# Patient Record
Sex: Male | Born: 1960 | Race: Black or African American | Hispanic: No | Marital: Single | Smoking: Current every day smoker
Health system: Southern US, Community
[De-identification: ages and names within clinical notes are randomized; demographics above are authoritative.]

## PROBLEM LIST (undated history)

## (undated) DIAGNOSIS — Z720 Tobacco use: Secondary | ICD-10-CM

## (undated) DIAGNOSIS — I639 Cerebral infarction, unspecified: Secondary | ICD-10-CM

## (undated) DIAGNOSIS — E785 Hyperlipidemia, unspecified: Secondary | ICD-10-CM

## (undated) DIAGNOSIS — I10 Essential (primary) hypertension: Secondary | ICD-10-CM

## (undated) DIAGNOSIS — Z9225 Personal history of immunosupression therapy: Secondary | ICD-10-CM

## (undated) DIAGNOSIS — I739 Peripheral vascular disease, unspecified: Secondary | ICD-10-CM

## (undated) DIAGNOSIS — G8929 Other chronic pain: Secondary | ICD-10-CM

## (undated) DIAGNOSIS — N289 Disorder of kidney and ureter, unspecified: Secondary | ICD-10-CM

## (undated) DIAGNOSIS — M545 Low back pain, unspecified: Secondary | ICD-10-CM

## (undated) DIAGNOSIS — M1990 Unspecified osteoarthritis, unspecified site: Secondary | ICD-10-CM

## (undated) DIAGNOSIS — F419 Anxiety disorder, unspecified: Secondary | ICD-10-CM

## (undated) DIAGNOSIS — I2510 Atherosclerotic heart disease of native coronary artery without angina pectoris: Secondary | ICD-10-CM

## (undated) DIAGNOSIS — I69398 Other sequelae of cerebral infarction: Secondary | ICD-10-CM

## (undated) DIAGNOSIS — Z8739 Personal history of other diseases of the musculoskeletal system and connective tissue: Secondary | ICD-10-CM

## (undated) HISTORY — PX: CARDIAC CATHETERIZATION: SHX172

## (undated) HISTORY — DX: Essential (primary) hypertension: I10

## (undated) HISTORY — DX: Hyperlipidemia, unspecified: E78.5

## (undated) HISTORY — PX: OTHER SURGICAL HISTORY: SHX169

## (undated) HISTORY — DX: Disorder of kidney and ureter, unspecified: N28.9

## (undated) HISTORY — PX: INGUINAL HERNIA REPAIR: SUR1180

## (undated) HISTORY — PX: AV FISTULA PLACEMENT: SHX1204

## (undated) HISTORY — PX: THROMBECTOMY AND REVISION OF ARTERIOVENTOUS (AV) GORETEX  GRAFT: SHX6120

## (undated) HISTORY — DX: Peripheral vascular disease, unspecified: I73.9

## (undated) HISTORY — PX: THROMBECTOMY / ARTERIOVENOUS GRAFT REVISION: SUR1351

---

## 1998-11-17 ENCOUNTER — Emergency Department (HOSPITAL_COMMUNITY): Admission: EM | Admit: 1998-11-17 | Discharge: 1998-11-17 | Payer: Self-pay | Admitting: Emergency Medicine

## 2005-12-20 ENCOUNTER — Ambulatory Visit (HOSPITAL_COMMUNITY): Admission: RE | Admit: 2005-12-20 | Discharge: 2005-12-20 | Payer: Self-pay | Admitting: Vascular Surgery

## 2006-12-31 HISTORY — PX: THROMBECTOMY / ARTERIOVENOUS GRAFT REVISION: SUR1351

## 2007-01-05 ENCOUNTER — Observation Stay (HOSPITAL_COMMUNITY): Admission: EM | Admit: 2007-01-05 | Discharge: 2007-01-05 | Payer: Self-pay | Admitting: Emergency Medicine

## 2007-01-05 ENCOUNTER — Ambulatory Visit: Payer: Self-pay | Admitting: Vascular Surgery

## 2007-01-24 ENCOUNTER — Ambulatory Visit (HOSPITAL_COMMUNITY): Admission: RE | Admit: 2007-01-24 | Discharge: 2007-01-24 | Payer: Self-pay | Admitting: Vascular Surgery

## 2007-03-14 ENCOUNTER — Ambulatory Visit: Payer: Self-pay | Admitting: Vascular Surgery

## 2007-03-15 ENCOUNTER — Ambulatory Visit (HOSPITAL_COMMUNITY): Admission: RE | Admit: 2007-03-15 | Discharge: 2007-03-15 | Payer: Self-pay | Admitting: Vascular Surgery

## 2007-03-21 ENCOUNTER — Ambulatory Visit (HOSPITAL_COMMUNITY): Admission: RE | Admit: 2007-03-21 | Discharge: 2007-03-21 | Payer: Self-pay | Admitting: Vascular Surgery

## 2007-03-24 ENCOUNTER — Ambulatory Visit (HOSPITAL_COMMUNITY): Admission: RE | Admit: 2007-03-24 | Discharge: 2007-03-24 | Payer: Self-pay | Admitting: Vascular Surgery

## 2007-03-31 ENCOUNTER — Ambulatory Visit (HOSPITAL_COMMUNITY): Admission: RE | Admit: 2007-03-31 | Discharge: 2007-03-31 | Payer: Self-pay | Admitting: Nephrology

## 2007-04-01 HISTORY — PX: ARTERIOVENOUS GRAFT PLACEMENT: SUR1029

## 2007-04-09 ENCOUNTER — Ambulatory Visit: Payer: Self-pay | Admitting: Vascular Surgery

## 2007-04-25 ENCOUNTER — Ambulatory Visit (HOSPITAL_COMMUNITY): Admission: RE | Admit: 2007-04-25 | Discharge: 2007-04-25 | Payer: Self-pay | Admitting: Vascular Surgery

## 2007-04-25 ENCOUNTER — Ambulatory Visit: Payer: Self-pay | Admitting: Vascular Surgery

## 2007-07-23 ENCOUNTER — Ambulatory Visit (HOSPITAL_COMMUNITY): Admission: RE | Admit: 2007-07-23 | Discharge: 2007-07-23 | Payer: Self-pay | Admitting: Vascular Surgery

## 2007-07-23 ENCOUNTER — Ambulatory Visit: Payer: Self-pay | Admitting: Vascular Surgery

## 2007-08-13 ENCOUNTER — Ambulatory Visit (HOSPITAL_COMMUNITY): Admission: RE | Admit: 2007-08-13 | Discharge: 2007-08-13 | Payer: Self-pay | Admitting: Nephrology

## 2007-10-25 ENCOUNTER — Ambulatory Visit: Payer: Self-pay | Admitting: Vascular Surgery

## 2007-10-25 ENCOUNTER — Inpatient Hospital Stay (HOSPITAL_COMMUNITY): Admission: EM | Admit: 2007-10-25 | Discharge: 2007-10-26 | Payer: Self-pay | Admitting: Nephrology

## 2007-12-31 HISTORY — PX: THROMBECTOMY: PRO61

## 2008-01-15 ENCOUNTER — Ambulatory Visit: Payer: Self-pay | Admitting: Surgery

## 2008-01-15 ENCOUNTER — Ambulatory Visit (HOSPITAL_COMMUNITY): Admission: RE | Admit: 2008-01-15 | Discharge: 2008-01-15 | Payer: Self-pay | Admitting: Surgery

## 2008-02-26 ENCOUNTER — Ambulatory Visit (HOSPITAL_COMMUNITY): Admission: RE | Admit: 2008-02-26 | Discharge: 2008-02-26 | Payer: Self-pay | Admitting: Vascular Surgery

## 2008-02-26 ENCOUNTER — Ambulatory Visit: Payer: Self-pay | Admitting: Vascular Surgery

## 2008-03-08 ENCOUNTER — Ambulatory Visit (HOSPITAL_COMMUNITY): Admission: RE | Admit: 2008-03-08 | Discharge: 2008-03-08 | Payer: Self-pay | Admitting: Vascular Surgery

## 2008-03-22 ENCOUNTER — Ambulatory Visit: Payer: Self-pay | Admitting: Surgery

## 2008-04-07 ENCOUNTER — Inpatient Hospital Stay (HOSPITAL_COMMUNITY): Admission: RE | Admit: 2008-04-07 | Discharge: 2008-04-08 | Payer: Self-pay | Admitting: Surgery

## 2008-04-07 ENCOUNTER — Ambulatory Visit: Payer: Self-pay | Admitting: Surgery

## 2008-06-18 ENCOUNTER — Ambulatory Visit (HOSPITAL_COMMUNITY): Admission: RE | Admit: 2008-06-18 | Discharge: 2008-06-18 | Payer: Self-pay | Admitting: Nephrology

## 2008-07-05 ENCOUNTER — Other Ambulatory Visit: Payer: Self-pay | Admitting: Emergency Medicine

## 2008-07-05 ENCOUNTER — Emergency Department (HOSPITAL_COMMUNITY): Admission: EM | Admit: 2008-07-05 | Discharge: 2008-07-05 | Payer: Self-pay | Admitting: Emergency Medicine

## 2008-07-20 ENCOUNTER — Ambulatory Visit: Payer: Self-pay | Admitting: Vascular Surgery

## 2008-07-20 ENCOUNTER — Ambulatory Visit (HOSPITAL_COMMUNITY): Admission: RE | Admit: 2008-07-20 | Discharge: 2008-07-20 | Payer: Self-pay | Admitting: Vascular Surgery

## 2009-02-06 ENCOUNTER — Emergency Department (HOSPITAL_COMMUNITY): Admission: EM | Admit: 2009-02-06 | Discharge: 2009-02-06 | Payer: Self-pay | Admitting: Emergency Medicine

## 2009-02-15 ENCOUNTER — Ambulatory Visit (HOSPITAL_COMMUNITY): Admission: RE | Admit: 2009-02-15 | Discharge: 2009-02-15 | Payer: Self-pay | Admitting: Nephrology

## 2009-02-18 ENCOUNTER — Ambulatory Visit: Payer: Self-pay | Admitting: Cardiology

## 2009-02-18 ENCOUNTER — Inpatient Hospital Stay (HOSPITAL_COMMUNITY): Admission: EM | Admit: 2009-02-18 | Discharge: 2009-02-21 | Payer: Self-pay | Admitting: Emergency Medicine

## 2009-02-21 ENCOUNTER — Encounter: Payer: Self-pay | Admitting: Cardiovascular Disease

## 2009-03-28 ENCOUNTER — Ambulatory Visit: Payer: Self-pay | Admitting: Surgery

## 2009-04-08 ENCOUNTER — Ambulatory Visit (HOSPITAL_COMMUNITY): Admission: RE | Admit: 2009-04-08 | Discharge: 2009-04-08 | Payer: Self-pay | Admitting: Surgery

## 2009-04-08 ENCOUNTER — Ambulatory Visit: Payer: Self-pay | Admitting: Surgery

## 2009-04-12 ENCOUNTER — Emergency Department (HOSPITAL_COMMUNITY): Admission: EM | Admit: 2009-04-12 | Discharge: 2009-04-12 | Payer: Self-pay | Admitting: Emergency Medicine

## 2009-04-12 ENCOUNTER — Ambulatory Visit (HOSPITAL_COMMUNITY): Admission: RE | Admit: 2009-04-12 | Discharge: 2009-04-12 | Payer: Self-pay | Admitting: Nephrology

## 2009-04-25 ENCOUNTER — Ambulatory Visit: Payer: Self-pay | Admitting: Surgery

## 2009-10-01 HISTORY — PX: KIDNEY TRANSPLANT: SHX239

## 2009-12-24 ENCOUNTER — Emergency Department (HOSPITAL_COMMUNITY): Admission: EM | Admit: 2009-12-24 | Discharge: 2009-12-25 | Payer: Self-pay | Admitting: Emergency Medicine

## 2010-01-09 ENCOUNTER — Ambulatory Visit: Payer: Self-pay | Admitting: Surgery

## 2010-01-11 ENCOUNTER — Ambulatory Visit: Payer: Self-pay | Admitting: Surgery

## 2010-01-11 ENCOUNTER — Ambulatory Visit (HOSPITAL_COMMUNITY): Admission: RE | Admit: 2010-01-11 | Discharge: 2010-01-11 | Payer: Self-pay | Admitting: Surgery

## 2010-01-23 ENCOUNTER — Ambulatory Visit: Payer: Self-pay | Admitting: Surgery

## 2010-01-31 ENCOUNTER — Ambulatory Visit (HOSPITAL_COMMUNITY): Admission: RE | Admit: 2010-01-31 | Discharge: 2010-01-31 | Payer: Self-pay | Admitting: Surgery

## 2010-02-13 ENCOUNTER — Ambulatory Visit: Payer: Self-pay | Admitting: Surgery

## 2010-04-11 ENCOUNTER — Inpatient Hospital Stay (HOSPITAL_COMMUNITY): Admission: EM | Admit: 2010-04-11 | Discharge: 2010-04-12 | Payer: Self-pay | Admitting: Emergency Medicine

## 2010-07-07 IMAGING — CR DG CHEST 2V
2 series · 2 of 2 positions shown · non-contrast
Comparison: 03/08/2008

CLINICAL DATA: Chest pain.

CHEST - 2 VIEW

[w chest pa]
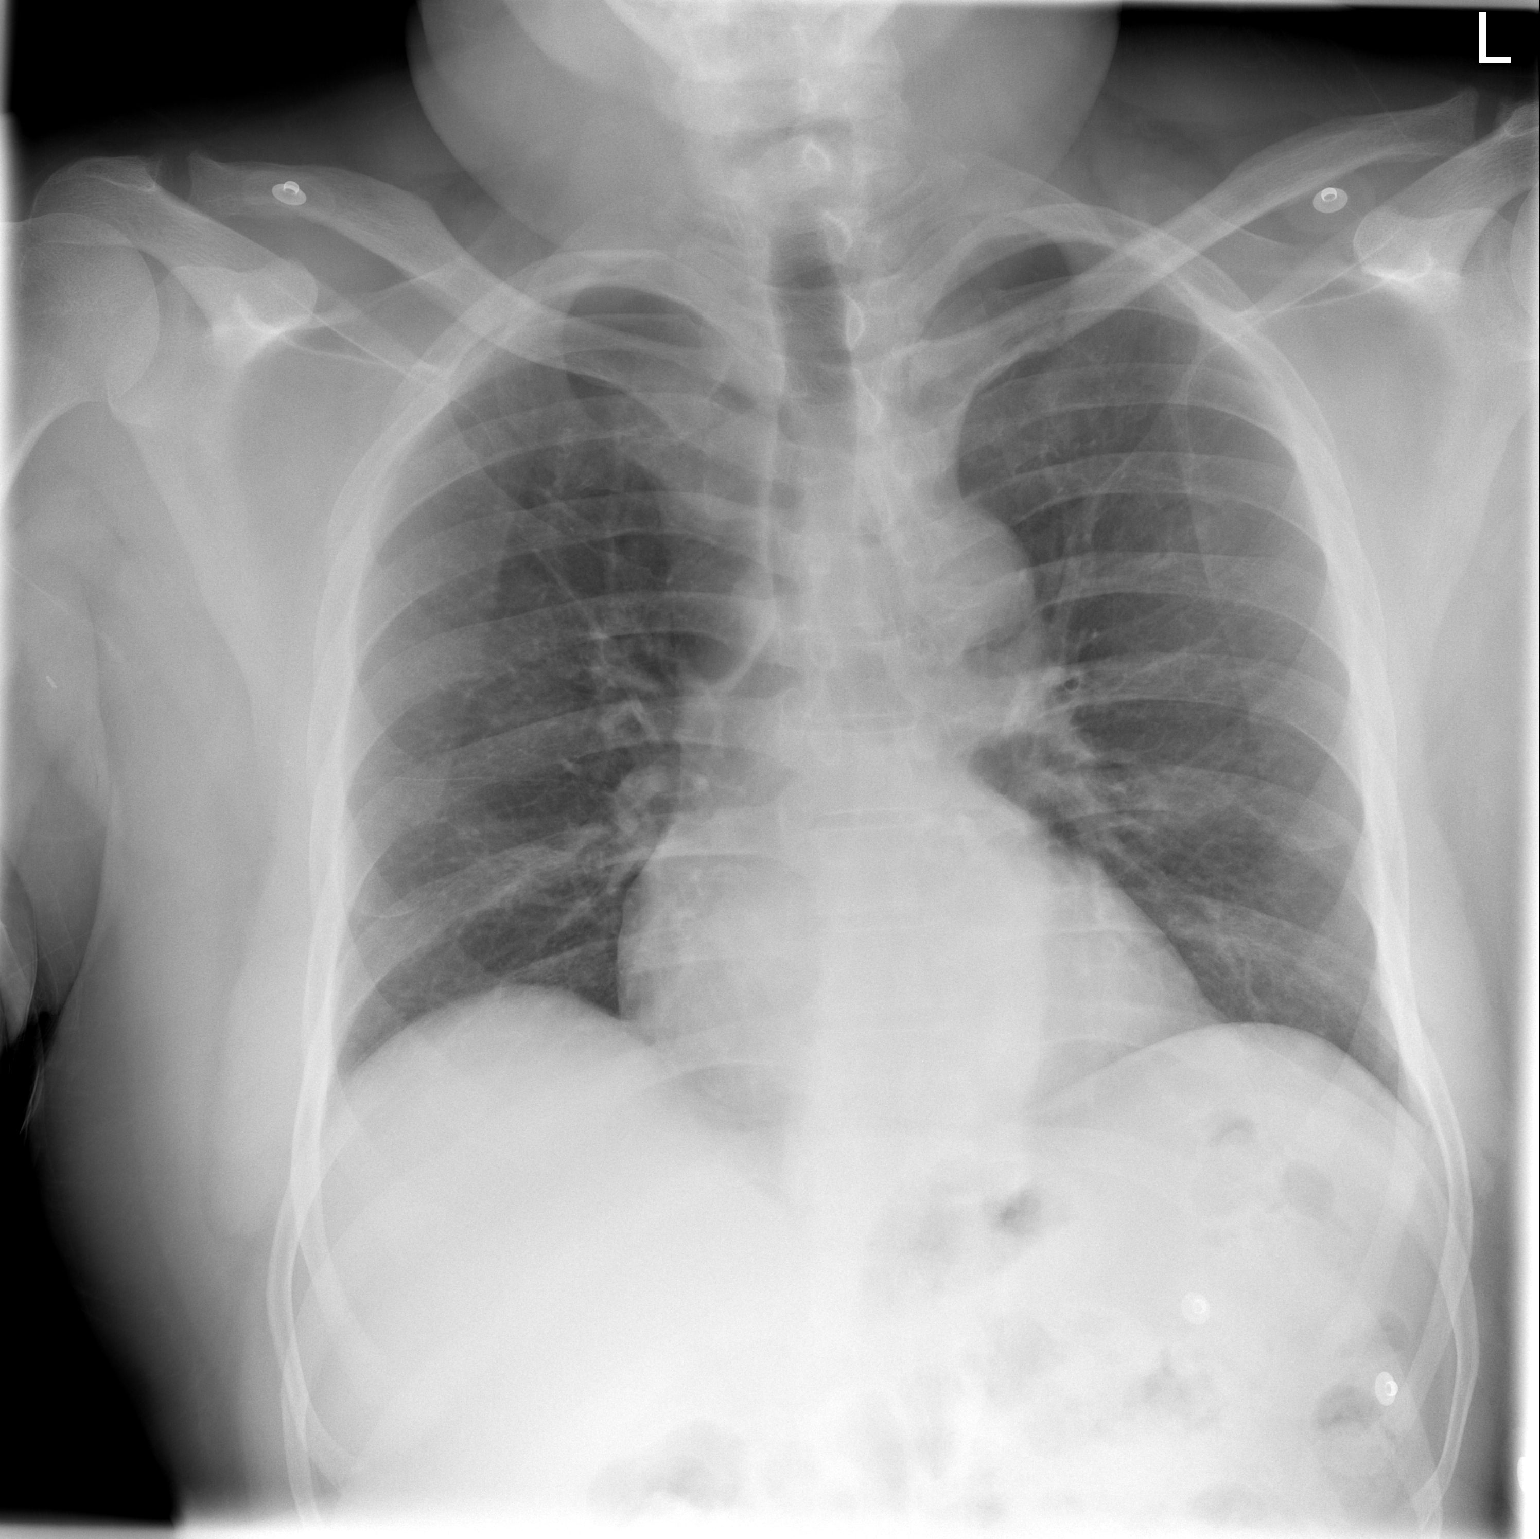

[w chest lat]
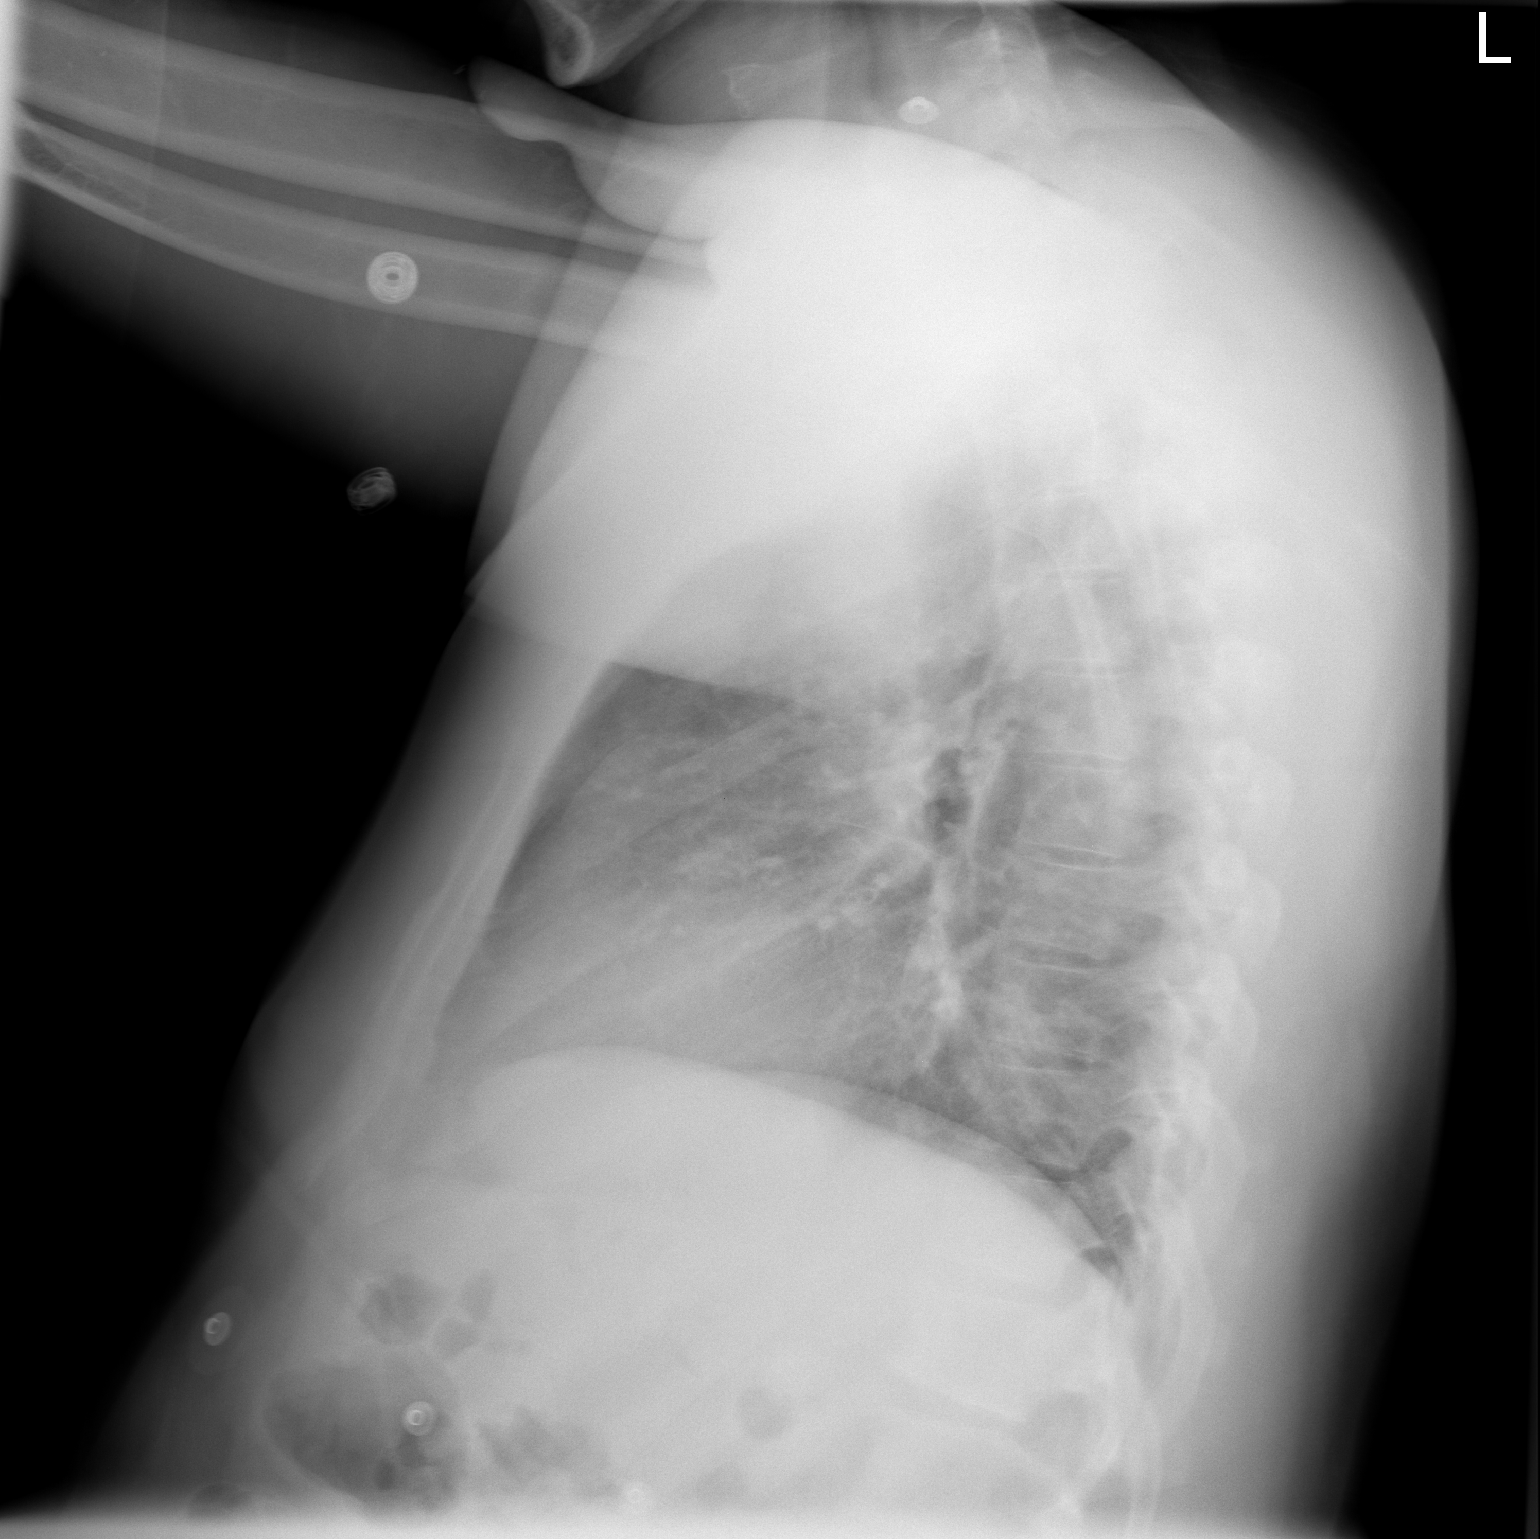

[2 of 2 positions shown; findings below may reference images not displayed]

FINDINGS: The cardiac silhouette, mediastinal and hilar contours
are within normal limits.  There is mild tortuosity, ectasia and
calcification of the thoracic aorta.  The lungs are clear of acute
process.  Minimal scarring changes.  The bony thorax is intact.
IMPRESSION: No acute cardiopulmonary findings.  Minimal scarring changes in the
lungs.

## 2010-10-22 ENCOUNTER — Encounter: Payer: Self-pay | Admitting: Emergency Medicine

## 2010-12-17 LAB — BASIC METABOLIC PANEL
BUN: 91 mg/dL — ABNORMAL HIGH (ref 6–23)
CO2: 26 mEq/L (ref 19–32)
Calcium: 9.5 mg/dL (ref 8.4–10.5)
Chloride: 104 mEq/L (ref 96–112)
Creatinine, Ser: 18.82 mg/dL — ABNORMAL HIGH (ref 0.4–1.5)
GFR calc Af Amer: 3 mL/min — ABNORMAL LOW (ref >60)
GFR calc non Af Amer: 3 mL/min — ABNORMAL LOW (ref >60)
Glucose, Bld: 91 mg/dL (ref 70–99)
Potassium: 4.9 mEq/L (ref 3.5–5.1)
Sodium: 142 mEq/L (ref 135–145)

## 2010-12-17 LAB — DIFFERENTIAL
Basophils Absolute: 0 10*3/uL (ref 0.0–0.1)
Basophils Relative: 1 % (ref 0–1)
Eosinophils Absolute: 0.4 10*3/uL (ref 0.0–0.7)
Eosinophils Relative: 5 % (ref 0–5)
Lymphocytes Relative: 25 % (ref 12–46)
Lymphs Abs: 2.1 10*3/uL (ref 0.7–4.0)
Monocytes Absolute: 0.5 10*3/uL (ref 0.1–1.0)
Monocytes Relative: 6 % (ref 3–12)
Neutro Abs: 5.4 10*3/uL (ref 1.7–7.7)
Neutrophils Relative %: 64 % (ref 43–77)

## 2010-12-17 LAB — CBC
HCT: 28.9 % — ABNORMAL LOW (ref 39.0–52.0)
Hemoglobin: 9.6 g/dL — ABNORMAL LOW (ref 13.0–17.0)
MCH: 33 pg (ref 26.0–34.0)
MCHC: 33.2 g/dL (ref 30.0–36.0)
MCV: 99.4 fL (ref 78.0–100.0)
Platelets: 188 10*3/uL (ref 150–400)
RBC: 2.91 MIL/uL — ABNORMAL LOW (ref 4.22–5.81)
RDW: 15.3 % (ref 11.5–15.5)
WBC: 8.5 10*3/uL (ref 4.0–10.5)

## 2010-12-17 LAB — BRAIN NATRIURETIC PEPTIDE: Pro B Natriuretic peptide (BNP): 687 pg/mL — ABNORMAL HIGH (ref 0.0–100.0)

## 2010-12-17 LAB — APTT: aPTT: 31 seconds (ref 24–37)

## 2010-12-17 LAB — PROTIME-INR
INR: 1.03 (ref 0.00–1.49)
INR: 1.16 (ref 0.00–1.49)
Prothrombin Time: 13.4 seconds (ref 11.6–15.2)
Prothrombin Time: 14.7 seconds (ref 11.6–15.2)

## 2010-12-17 LAB — MRSA PCR SCREENING: MRSA by PCR: NEGATIVE

## 2010-12-19 LAB — POCT I-STAT 4, (NA,K, GLUC, HGB,HCT)
Glucose, Bld: 85 mg/dL (ref 70–99)
HCT: 36 % — ABNORMAL LOW (ref 39.0–52.0)
Hemoglobin: 12.2 g/dL — ABNORMAL LOW (ref 13.0–17.0)
Potassium: 4.9 mEq/L (ref 3.5–5.1)
Sodium: 137 mEq/L (ref 135–145)

## 2010-12-19 LAB — PROTIME-INR
INR: 1 (ref 0.00–1.49)
Prothrombin Time: 13.1 seconds (ref 11.6–15.2)

## 2010-12-20 LAB — POCT I-STAT, CHEM 8
BUN: 49 mg/dL — ABNORMAL HIGH (ref 6–23)
Calcium, Ion: 1.04 mmol/L — ABNORMAL LOW (ref 1.12–1.32)
Chloride: 100 mEq/L (ref 96–112)
Creatinine, Ser: 11.1 mg/dL — ABNORMAL HIGH (ref 0.4–1.5)
Glucose, Bld: 90 mg/dL (ref 70–99)
HCT: 41 % (ref 39.0–52.0)
Hemoglobin: 13.9 g/dL (ref 13.0–17.0)
Potassium: 4.8 mEq/L (ref 3.5–5.1)
Sodium: 137 mEq/L (ref 135–145)
TCO2: 35 mmol/L (ref 0–100)

## 2010-12-20 LAB — PROTIME-INR
INR: 0.97 (ref 0.00–1.49)
Prothrombin Time: 12.8 seconds (ref 11.6–15.2)

## 2010-12-24 LAB — DIFFERENTIAL
Basophils Absolute: 0.1 10*3/uL (ref 0.0–0.1)
Basophils Relative: 1 % (ref 0–1)
Eosinophils Absolute: 0.4 10*3/uL (ref 0.0–0.7)
Eosinophils Relative: 4 % (ref 0–5)
Lymphocytes Relative: 24 % (ref 12–46)
Lymphs Abs: 2.3 10*3/uL (ref 0.7–4.0)
Monocytes Absolute: 0.8 10*3/uL (ref 0.1–1.0)
Monocytes Relative: 8 % (ref 3–12)
Neutro Abs: 6 10*3/uL (ref 1.7–7.7)
Neutrophils Relative %: 63 % (ref 43–77)

## 2010-12-24 LAB — POCT I-STAT 3, VENOUS BLOOD GAS (G3P V)
Acid-Base Excess: 1 mmol/L (ref 0.0–2.0)
Bicarbonate: 25.8 mEq/L — ABNORMAL HIGH (ref 20.0–24.0)
O2 Saturation: 66 %
TCO2: 27 mmol/L (ref 0–100)
pCO2, Ven: 42 mmHg — ABNORMAL LOW (ref 45.0–50.0)
pH, Ven: 7.395 — ABNORMAL HIGH (ref 7.250–7.300)
pO2, Ven: 34 mmHg (ref 30.0–45.0)

## 2010-12-24 LAB — COMPREHENSIVE METABOLIC PANEL
ALT: 26 U/L (ref 0–53)
AST: 20 U/L (ref 0–37)
Albumin: 3.5 g/dL (ref 3.5–5.2)
Alkaline Phosphatase: 57 U/L (ref 39–117)
BUN: 74 mg/dL — ABNORMAL HIGH (ref 6–23)
CO2: 26 mEq/L (ref 19–32)
Calcium: 9.6 mg/dL (ref 8.4–10.5)
Chloride: 98 mEq/L (ref 96–112)
Creatinine, Ser: 15.27 mg/dL — ABNORMAL HIGH (ref 0.4–1.5)
GFR calc Af Amer: 4 mL/min — ABNORMAL LOW (ref >60)
GFR calc non Af Amer: 3 mL/min — ABNORMAL LOW (ref >60)
Glucose, Bld: 100 mg/dL — ABNORMAL HIGH (ref 70–99)
Potassium: 5.5 mEq/L — ABNORMAL HIGH (ref 3.5–5.1)
Sodium: 140 mEq/L (ref 135–145)
Total Bilirubin: 0.5 mg/dL (ref 0.3–1.2)
Total Protein: 7.7 g/dL (ref 6.0–8.3)

## 2010-12-24 LAB — CBC
HCT: 33.9 % — ABNORMAL LOW (ref 39.0–52.0)
Hemoglobin: 10.8 g/dL — ABNORMAL LOW (ref 13.0–17.0)
MCHC: 31.9 g/dL (ref 30.0–36.0)
MCV: 96.4 fL (ref 78.0–100.0)
Platelets: 227 10*3/uL (ref 150–400)
RBC: 3.51 MIL/uL — ABNORMAL LOW (ref 4.22–5.81)
RDW: 14.7 % (ref 11.5–15.5)
WBC: 9.6 10*3/uL (ref 4.0–10.5)

## 2010-12-24 LAB — POCT CARDIAC MARKERS
CKMB, poc: 1.8 ng/mL (ref 1.0–8.0)
Myoglobin, poc: >500 ng/mL (ref 12–200)
Troponin i, poc: <0.05 ng/mL (ref 0.00–0.09)

## 2010-12-24 LAB — BRAIN NATRIURETIC PEPTIDE: Pro B Natriuretic peptide (BNP): 175 pg/mL — ABNORMAL HIGH (ref 0.0–100.0)

## 2010-12-24 LAB — ETHANOL: Alcohol, Ethyl (B): <5 mg/dL (ref 0–10)

## 2011-01-07 LAB — POCT I-STAT 4, (NA,K, GLUC, HGB,HCT)
Glucose, Bld: 103 mg/dL — ABNORMAL HIGH (ref 70–99)
HCT: 41 % (ref 39.0–52.0)
Hemoglobin: 13.9 g/dL (ref 13.0–17.0)
Potassium: 4.6 mEq/L (ref 3.5–5.1)
Sodium: 137 mEq/L (ref 135–145)

## 2011-01-07 LAB — PROTIME-INR
INR: 1 (ref 0.00–1.49)
Prothrombin Time: 13.3 seconds (ref 11.6–15.2)

## 2011-01-07 LAB — APTT: aPTT: 41 seconds — ABNORMAL HIGH (ref 24–37)

## 2011-01-09 LAB — COMPREHENSIVE METABOLIC PANEL
ALT: 21 U/L (ref 0–53)
AST: 27 U/L (ref 0–37)
Albumin: 3.5 g/dL (ref 3.5–5.2)
Alkaline Phosphatase: 59 U/L (ref 39–117)
BUN: 34 mg/dL — ABNORMAL HIGH (ref 6–23)
CO2: 30 mEq/L (ref 19–32)
Calcium: 9.7 mg/dL (ref 8.4–10.5)
Chloride: 93 mEq/L — ABNORMAL LOW (ref 96–112)
Creatinine, Ser: 12.59 mg/dL — ABNORMAL HIGH (ref 0.4–1.5)
GFR calc Af Amer: 5 mL/min — ABNORMAL LOW (ref >60)
GFR calc non Af Amer: 4 mL/min — ABNORMAL LOW (ref >60)
Glucose, Bld: 109 mg/dL — ABNORMAL HIGH (ref 70–99)
Potassium: 4.2 mEq/L (ref 3.5–5.1)
Sodium: 137 mEq/L (ref 135–145)
Total Bilirubin: 0.7 mg/dL (ref 0.3–1.2)
Total Protein: 7.2 g/dL (ref 6.0–8.3)

## 2011-01-09 LAB — PROTIME-INR
INR: 1 (ref 0.00–1.49)
INR: 1 (ref 0.00–1.49)
INR: 1 (ref 0.00–1.49)
INR: 1 (ref 0.00–1.49)
INR: 1 (ref 0.00–1.49)
Prothrombin Time: 13.1 seconds (ref 11.6–15.2)
Prothrombin Time: 13.2 seconds (ref 11.6–15.2)
Prothrombin Time: 13.2 seconds (ref 11.6–15.2)
Prothrombin Time: 13.5 seconds (ref 11.6–15.2)
Prothrombin Time: 13.9 seconds (ref 11.6–15.2)

## 2011-01-09 LAB — LIPID PANEL
Cholesterol: 205 mg/dL — ABNORMAL HIGH (ref 0–200)
HDL: 33 mg/dL — ABNORMAL LOW (ref >39)
LDL Cholesterol: UNDETERMINED mg/dL (ref 0–99)
Total CHOL/HDL Ratio: 6.2 RATIO
Triglycerides: 456 mg/dL — ABNORMAL HIGH (ref <150)
VLDL: UNDETERMINED mg/dL (ref 0–40)

## 2011-01-09 LAB — CARDIAC PANEL(CRET KIN+CKTOT+MB+TROPI)
CK, MB: 3.5 ng/mL (ref 0.3–4.0)
Relative Index: 2.3 (ref 0.0–2.5)
Total CK: 150 U/L (ref 7–232)
Troponin I: 0.59 ng/mL (ref 0.00–0.06)

## 2011-01-09 LAB — BASIC METABOLIC PANEL
BUN: 26 mg/dL — ABNORMAL HIGH (ref 6–23)
CO2: 30 mEq/L (ref 19–32)
Calcium: 9.4 mg/dL (ref 8.4–10.5)
Chloride: 91 mEq/L — ABNORMAL LOW (ref 96–112)
Creatinine, Ser: 11.44 mg/dL — ABNORMAL HIGH (ref 0.4–1.5)
GFR calc Af Amer: 6 mL/min — ABNORMAL LOW (ref >60)
GFR calc non Af Amer: 5 mL/min — ABNORMAL LOW (ref >60)
Glucose, Bld: 110 mg/dL — ABNORMAL HIGH (ref 70–99)
Potassium: 4.2 mEq/L (ref 3.5–5.1)
Sodium: 132 mEq/L — ABNORMAL LOW (ref 135–145)

## 2011-01-09 LAB — DIFFERENTIAL
Basophils Absolute: 0.1 10*3/uL (ref 0.0–0.1)
Basophils Relative: 1 % (ref 0–1)
Eosinophils Absolute: 0.3 10*3/uL (ref 0.0–0.7)
Eosinophils Relative: 3 % (ref 0–5)
Lymphocytes Relative: 29 % (ref 12–46)
Lymphs Abs: 2.6 10*3/uL (ref 0.7–4.0)
Monocytes Absolute: 1.1 10*3/uL — ABNORMAL HIGH (ref 0.1–1.0)
Monocytes Relative: 12 % (ref 3–12)
Neutro Abs: 4.9 10*3/uL (ref 1.7–7.7)
Neutrophils Relative %: 55 % (ref 43–77)

## 2011-01-09 LAB — TROPONIN I
Troponin I: 0.21 ng/mL — ABNORMAL HIGH (ref 0.00–0.06)
Troponin I: 0.27 ng/mL — ABNORMAL HIGH (ref 0.00–0.06)
Troponin I: 0.04 ng/mL (ref 0.00–0.06)

## 2011-01-09 LAB — CK TOTAL AND CKMB (NOT AT ARMC)
CK, MB: 3.3 ng/mL (ref 0.3–4.0)
CK, MB: 3.9 ng/mL (ref 0.3–4.0)
CK, MB: 1 ng/mL (ref 0.3–4.0)
Relative Index: 2 (ref 0.0–2.5)
Relative Index: 2.4 (ref 0.0–2.5)
Relative Index: 0.6 (ref 0.0–2.5)
Total CK: 162 U/L (ref 7–232)
Total CK: 166 U/L (ref 7–232)
Total CK: 174 U/L (ref 7–232)

## 2011-01-09 LAB — APTT: aPTT: 34 seconds (ref 24–37)

## 2011-01-09 LAB — CBC
HCT: 35.4 % — ABNORMAL LOW (ref 39.0–52.0)
HCT: 39.2 % (ref 39.0–52.0)
Hemoglobin: 12.1 g/dL — ABNORMAL LOW (ref 13.0–17.0)
Hemoglobin: 13.1 g/dL (ref 13.0–17.0)
MCHC: 33.5 g/dL (ref 30.0–36.0)
MCHC: 34.2 g/dL (ref 30.0–36.0)
MCV: 94.3 fL (ref 78.0–100.0)
MCV: 94.6 fL (ref 78.0–100.0)
Platelets: 181 10*3/uL (ref 150–400)
Platelets: 214 10*3/uL (ref 150–400)
RBC: 3.74 MIL/uL — ABNORMAL LOW (ref 4.22–5.81)
RBC: 4.15 MIL/uL — ABNORMAL LOW (ref 4.22–5.81)
RDW: 15.6 % — ABNORMAL HIGH (ref 11.5–15.5)
RDW: 15.8 % — ABNORMAL HIGH (ref 11.5–15.5)
WBC: 7.5 10*3/uL (ref 4.0–10.5)
WBC: 8.9 10*3/uL (ref 4.0–10.5)

## 2011-01-09 LAB — RENAL FUNCTION PANEL
Albumin: 3.3 g/dL — ABNORMAL LOW (ref 3.5–5.2)
BUN: 49 mg/dL — ABNORMAL HIGH (ref 6–23)
CO2: 25 mEq/L (ref 19–32)
Calcium: 8.7 mg/dL (ref 8.4–10.5)
Chloride: 90 mEq/L — ABNORMAL LOW (ref 96–112)
Creatinine, Ser: 15.2 mg/dL — ABNORMAL HIGH (ref 0.4–1.5)
GFR calc Af Amer: 4 mL/min — ABNORMAL LOW (ref >60)
GFR calc non Af Amer: 3 mL/min — ABNORMAL LOW (ref >60)
Glucose, Bld: 181 mg/dL — ABNORMAL HIGH (ref 70–99)
Phosphorus: 2.9 mg/dL (ref 2.3–4.6)
Potassium: 4.1 mEq/L (ref 3.5–5.1)
Sodium: 131 mEq/L — ABNORMAL LOW (ref 135–145)

## 2011-01-09 LAB — POCT CARDIAC MARKERS
CKMB, poc: 2.6 ng/mL (ref 1.0–8.0)
Myoglobin, poc: >500 ng/mL (ref 12–200)
Troponin i, poc: 0.1 ng/mL — ABNORMAL HIGH (ref 0.00–0.09)

## 2011-01-09 LAB — D-DIMER, QUANTITATIVE: D-Dimer, Quant: 1.05 ug/mL-FEU — ABNORMAL HIGH (ref 0.00–0.48)

## 2011-01-09 LAB — BRAIN NATRIURETIC PEPTIDE: Pro B Natriuretic peptide (BNP): 37 pg/mL (ref 0.0–100.0)

## 2011-01-09 LAB — HEMOGLOBIN A1C
Hgb A1c MFr Bld: 5.5 % (ref 4.6–6.1)
Mean Plasma Glucose: 111 mg/dL

## 2011-02-13 NOTE — Op Note (Signed)
NAME:  Theodore Arellano, Theodore Arellano NO.:  [id number]   MEDICAL RECORD NO.:  [id number]           PATIENT TYPE:   LOCATION:                                 FACILITY:   PHYSICIAN:  Nelda Severe. Kellie Simmering, M.D.       DATE OF BIRTH:   DATE OF PROCEDURE:  DATE OF DISCHARGE:                               OPERATIVE REPORT   PREOPERATIVE DIAGNOSIS:  End-stage renal disease with thrombosed  arteriovenous Gore-Tex graft, left arm.   POSTOPERATIVE DIAGNOSIS:  End-stage renal disease with thrombosed  arteriovenous Gore-Tex graft, left arm.   OPERATION:  Simple thrombectomy of arteriovenous Gore-Tex graft, left  upper arm, with exploration of venous end.   SURGEON:  Dr. Kellie Simmering   ANESTHESIA:  Local.   PROCEDURE:  The patient was taken to the operating room and placed in a  supine position, at which time, the left upper extremity was prepped  with Betadine scrub solution and draped in a routine sterile manner.  After infiltration of 1% Xylocaine with epinephrine, a longitudinal  incision was made through the previous scar in the left upper arm.  The  Gore-Tex axillary vein anastomosis was dissected free.  A transverse  opening was made in the graft just proximal to the anastomosis, and the  graft itself was easily thrombectomized with a Fogarty traversing the  graft with no difficulty with the arterial plug being removed and  excellent flow present.  The venous anastomosis was widely patent, being  at 5-6 mm in size, and a Fogarty catheter would easily traverse this  into the chest.  No thrombus was noted in the native venous system.  The  graft was reclosed with a continuous 6-0 Prolene suture.  The clamp was  released, and there was an excellent pulse and thrill in the graft.  No  protamine or heparin was administered.  The wound was then closed in  layers with Vicryl in a subcuticular fashion.  A sterile dressing was  applied.  The patient was taken to recovery in satisfactory  condition.      Nelda Severe Kellie Simmering, M.D.  Electronically Signed     JDL/MEDQ  D:  03/15/2007  T:  03/16/2007  Job:  [id number]

## 2011-02-13 NOTE — Op Note (Signed)
NAME:  Theodore Arellano, Theodore Arellano                 ACCOUNT NO.:  [id number]   MEDICAL RECORD NO.:  [id number]          PATIENT TYPE:  AMB   LOCATION:  SDS                          FACILITY:  [facility]   PHYSICIAN:  Nelda Severe. Kellie Simmering, M.D.  DATE OF BIRTH:  05-24-1961   DATE OF PROCEDURE:  07/23/2007  DATE OF DISCHARGE:  07/23/2007                               OPERATIVE REPORT   PREOPERATIVE DIAGNOSIS:  Thrombosed AV Gore-Tex graft right upper arm  with end-stage renal disease.   POSTOPERATIVE DIAGNOSIS:  Thrombosed AV Gore-Tex graft right upper arm  with end-stage renal disease.   OPERATION:  Thrombectomy AV Gore-Tex graft right upper arm with revision  of venous end.   SURGEON:  Nelda Severe. Kellie Simmering, M.D.   ASSISTANT:  Nurse.   ANESTHESIA:  Local.   PROCEDURE:  The patient was taken to the operating room, placed in  supine position at which time the right upper extremity was prepped with  Betadine scrub and solution and draped in routine sterile manner.  After  infiltration with 1% Xylocaine with epinephrine, longitudinal incision  was made through the previous scar in the axilla.  Gore-Tex to axillary  vein anastomosis dissected free proximally and distally.  A few branches  were ligated with 4-0 silk ties and divided.  The vein was mobilized.  No heparin was given.  Transverse opening was made in the graft just  proximal to the anastomosis, graft itself easily thrombectomized with  excellent flow being reestablished.  There was no apparent narrowing at  the venous anastomosis, although it was somewhat long but very patulous.  It was decided to resect this and shorten it slightly since there was no  obvious reason for thrombosis of the graft.  After resecting the  previous anastomosis, an end-to-end anastomosis was done with 6-0  Prolene.  The vein was 5 mm in size and of good quality.  After this was  completed, there was an excellent pulse and palpable thrill in the  graft.  No protamine or heparin  was given.  Wound was irrigated with  saline, closed in layers with Vicryl in a subcuticular fashion.  Sterile  dressing applied.  The patient taken to the recovery room in  satisfactory condition.      Nelda Severe Kellie Simmering, M.D.  Electronically Signed     JDL/MEDQ  D:  07/23/2007  T:  07/24/2007  Job:  [id number]

## 2011-02-13 NOTE — Op Note (Signed)
NAME:  Theodore Arellano, Theodore Arellano                 ACCOUNT NO.:  [id number]   MEDICAL RECORD NO.:  [id number]          PATIENT TYPE:  AMB   LOCATION:  SDS                          FACILITY:  [facility]   PHYSICIAN:  Judeth Cornfield. Scot Dock, M.D.DATE OF BIRTH:  1961-08-17   DATE OF PROCEDURE:  02/26/2008  DATE OF DISCHARGE:  02/26/2008                               OPERATIVE REPORT   PREOPERATIVE DIAGNOSIS:  Chronic renal failure with clotted left upper  arm arteriovenous graft.   POSTOPERATIVE DIAGNOSIS:  Chronic renal failure with clotted left upper  arm arteriovenous graft.   PROCEDURES:  Thrombectomy of right upper arm AV graft with insertion of  new segment of graft to the more proximal axillary vein.   SURGEON:  Angelia Mould, MD   ASSISTANT:  Pearline Cables, RNFA   ANESTHESIA:  Local with sedation.   TECHNIQUE:  The patient was taken to the operating room, sedated by  anesthesia.  The right upper extremity was prepped and draped in the  usual sterile fashion.  After the skin was infiltrated with 1%  lidocaine, incision was made in the axilla and the venous limb of the  graft was dissected free.  I dissected up very high in the axilla above  the area of intimal hyperplasia and the old vein patch.  The graft was  then divided.  The patient was heparinized.  Graft thrombectomy was  achieved using a #4 Fogarty catheter.  The arterial plug was retrieved.  Excellent inflow was established with the graft, which was flushed with  heparinized saline and clamped.  Next, the more proximal vein was  spatulated.  I went very high in the axilla where it would be difficult  to get any higher.  The 7-mm graft was spatulated and sewn end-to-end to  the vein using continuous 6-0 Prolene suture.  The graft was then pulled  to the appropriate length for anastomosis to the old graft, which was  done with continuous 6-0 Prolene suture.  At the completion, there was  an excellent thrill in the graft.  He had  a dye allergy and I did not  shoot an intraoperative arteriogram.  Hemostasis was obtained.  The  wound and heparin was partially reversed with protamine.  The wound was  closed with deep layer of 3-0 Vicryl and the skin closed with 4-0  Vicryl.  Sterile dressing was applied.  The patient tolerated the  procedure well and was transferred to the recovery room in satisfactory  condition.  All needle and sponge counts were correct.      Judeth Cornfield. Scot Dock, M.D.  Electronically Signed     CSD/MEDQ  D:  02/26/2008  T:  02/27/2008  Job:  [id number]

## 2011-02-13 NOTE — Op Note (Signed)
NAME:  Theodore Arellano, Theodore Arellano                 ACCOUNT NO.:  [id number]   MEDICAL RECORD NO.:  [id number]          PATIENT TYPE:  AMB   LOCATION:  SDS                          FACILITY:  [facility]   PHYSICIAN:  Judeth Cornfield. Scot Dock, M.D.DATE OF BIRTH:  1961/01/26   DATE OF PROCEDURE:  03/21/2007  DATE OF DISCHARGE:                               OPERATIVE REPORT   PREOPERATIVE DIAGNOSIS:  Chronic renal failure.   POSTOPERATIVE DIAGNOSIS:  Chronic renal failure.   OPERATION/PROCEDURE:  1. Thrombectomy of left upper arm arteriovenous graft.  2. Intraoperative arteriogram.   SURGEON:  Judeth Cornfield. Scot Dock, M.D.   ASSISTANT:  [place] Cellar, RNFA   ANESTHESIA:  Local with sedation.   DESCRIPTION OF PROCEDURE:  The patient was taken to the operating room  sedated by anesthesia.  The left upper extremity was prepped and draped  in the usual sterile fashion.  After the skin was infiltrated with 1%  lidocaine,  the incision over the venous anastomosis was opened and  venous limb of the graft dissected free.  The graft was divided.  The  patient was heparinized.  Graft thrombectomy was achieved using a #4  Fogarty catheter.  The arterial plug was clearly retrieved.  The graft  was flushed with heparinized saline and clamped.  Venous thrombectomy  was then performed.  The venous anastomosis was widely patent.  I  resected a small amount of the graft and sewed the graft back end-to-end  with continuous 6-0 Prolene suture.  There is an excellent thrill in the  graft.  I shot an intraoperative fistulogram which showed no technical  problems.  Hemostasis was obtained of the wound.  The wound was closed  with deep layer of 3-0 Vicryl.  The skin closed with 4-0 Vicryl.  Sterile dressing was applied.  The patient tolerated procedure well was  transferred to recovery room in satisfactory condition.  All needle and  sponge counts were correct.      Judeth Cornfield. Scot Dock, M.D.  Electronically  Signed     CSD/MEDQ  D:  03/21/2007  T:  03/22/2007  Job:  [id number]

## 2011-02-13 NOTE — Assessment & Plan Note (Signed)
OFFICE VISIT   DRAGON, STAGG  DOB:  1961/05/06                                       02/13/2010  [id number]   Patient returns today from undergoing right basilic vein transposition  on 01/31/2010.   I can appreciate an adequate thrill in his arm.   We will continue to monitor how this matures.  I will see him back in 5  weeks with an ultrasound.     Eldridge Abrahams, MD  Electronically Signed   VWB/MEDQ  D:  02/13/2010  T:  02/14/2010  Job:  [id number]

## 2011-02-13 NOTE — Discharge Summary (Signed)
NAMESAVINO, TRUESDELL NO.:  [id number]   MEDICAL RECORD NO.:  [id number]          PATIENT TYPE:  INP   LOCATION:  [id number]                         FACILITY:  [facility]   PHYSICIAN:  Noel Christmas, MD    DATE OF BIRTH:  03-08-1961   DATE OF ADMISSION:  02/18/2009  DATE OF DISCHARGE:  02/21/2009                               DISCHARGE SUMMARY   REASON FOR ADMISSION:  Chest pain.   FINAL DISCHARGE DIAGNOSES:  1. Chest pain, most likely atypical.  2. End-stage renal disease, dialysis dependent.  3. Chronic anemia.  4. Hypertension.  5. Secondary hyperparathyroidism.  6. Gouty arthritis.  7. Chronic anticoagulation, secondary to multiple blooded      arteriovenous fistulas.  8. Dyslipidemia.  9. Elevated troponin, likely secondary to end-stage renal disease.   CONSULT DURING THIS ADMISSION:  Cardiology consult.   PROCEDURE DONE DURING THIS ADMISSION:  Stress Cardiolite study which was  read as negative for ischemia.   BRIEF HISTORY OF PRESENT ILLNESS AND HOSPITAL COURSE:  This is a 50-year-  old gentleman with past medical history significant for end-stage renal  disease and dyslipidemia, came to the emergency room with history of  chest pain.  He was admitted to rule out acute coronary syndrome and  myocardial infarction.  Cardiac enzyme did reveal mild elevation of  troponin, but because the patient does have end-stage renal disease,  this was not complicit for myocardial infarction.  CK-MB and CK total  were within normal limits.  We consulted with Cardiology, and the  opinion was that the patient will need stress Cardiolite study prior to  being discharged and because of numerous risk factors, which include end-  stage renal disease and diabetes mellitus.  The patient has done well in  the hospital, his chest pain has resolved and he is stable enough to go  home today since stress test is negative for ischemia.   DISCHARGE MEDICATIONS:  On discharge, he is to  go home with:  1. Norvasc 2.5 mg p.o. daily.  2. Lisinopril 20 mg p.o. daily.  3. Celexa 20 mg p.o. daily.  4. Prevacid 30 mg p.o. daily.  5. Aspirin 81 mg p.o. daily.  6. Multivitamins 1 tablet daily.  7. Coreg 25 mg p.o. b.i.d.  8. Coumadin 1 mg p.o. daily for multiple clotted AV fistula.  9. Reno-Vite 1 tablet daily.  10.Sensipar 30 mg p.o. daily.  11.Zocor 40 mg p.o. daily.  12.Ambien 10 mg p.o. at bedtime.   He is to follow up with his primary care physicians at [facility] in  [place], [place] in 1-2 weeks and if the patient is here in  [place] area then he should follow with a doctor with the help of  social workers in 1-2 weeks.  He is to keep his dialysis appointment as  normally scheduled and to keep all other prearranged outpatient  appointments.   TIME USED FOR DISCHARGE PLANNING:  Greater than 30 minutes.   PHYSICAL EXAMINATION:  VITAL SIGNS:  Today are temperature 98.1, pulse  76, respiration 18, blood pressure 114/75 and  saturating 96% on room  air.  CHEST:  Clear to auscultation bilaterally.  ABDOMEN:  Soft and nontender.  Extremities:  No clubbing.  No cyanosis.  No edema.  CARDIOVASCULAR:  First and second heart sounds only.  CENTRAL NERVOUS SYSTEM:  Nonfocal.      Noel Christmas, MD  Electronically Signed     GU/MEDQ  D:  02/21/2009  T:  02/22/2009  Job:  [id number]   cc:   [facility], [place], [place]

## 2011-02-13 NOTE — Procedures (Signed)
CEPHALIC VEIN MAPPING   INDICATION:  End-stage renal disease.   HISTORY:  End-stage renal disease.   EXAM:  Duplex of the right cephalic vein.   The right cephalic vein is compressible.   Diameter measurements range from 0.31 to 0.27.   The left cephalic vein not evaluated.   IMPRESSION:  1. Patent right cephalic vein which is of acceptable diameter for use      as a dialysis access site.  2. A branch noted at the upper arm and courses all the way down to the      lower arm.  3. See attached for drawing.   ___________________________________________  V. Leia Alf, MD   MG/MEDQ  D:  03/28/2009  T:  03/28/2009  Job:  [id number]

## 2011-02-13 NOTE — Op Note (Signed)
NAME:  Theodore Arellano, Theodore Arellano                 ACCOUNT NO.:  [id number]   MEDICAL RECORD NO.:  [id number]          PATIENT TYPE:  AMB   LOCATION:  SDS                          FACILITY:  [facility]   PHYSICIAN:  Rosetta Posner, M.D.    DATE OF BIRTH:  August 08, 1961   DATE OF PROCEDURE:  07/20/2008  DATE OF DISCHARGE:                               OPERATIVE REPORT   PREOPERATIVE DIAGNOSIS:  End-stage renal disease with occluded right  femoral loop atrioventricular Gore-Tex graft.   POSTOPERATIVE DIAGNOSIS:  End-stage renal disease with occluded right  femoral loop atrioventricular Gore-Tex graft.   PROCEDURE:  Thrombectomy and revision of venous anastomosis of right  femoral loop AV Gore-Tex graft.   SURGEON:  Rosetta Posner, MD   ASSISTANT:  Jacinta Shoe, PA.   ANESTHESIA:  General endotracheal.   COMPLICATIONS:  None.   DISPOSITION:  To recovery room, stable.   PROCEDURE IN DETAIL:  The patient was taken to the operating room and  placed in supine position.  The area of the right groin and right leg  was prepped and draped in the usual sterile fashion.  Incision was made  over the prior femoral anastomosis and carried down to isolate the [facility] graft.  This was to the femoral vein.  The vein was isolated  proximal and distal to this.  The graft was opened near the venous  anastomosis and there was a stenosis at the venous anastomosis.  The  vein above this was of excellent caliber.  The  graft was flushed with  heparinized saline and reoccluded.  Next, the graft itself was  thrombectomized.  The arterialized plug was removed and excellent flow  was encountered.  The graft was flushed with heparinized saline and  reoccluded.  The patient was given 5000 units of intravenous heparin.  Using a Cooley clamp, the common femoral vein was occluded around the  level of the prior venous anastomosis.  The old venous anastomosis was  taken down through the new interposition graft of 7-mm Gore-Tex  brought  on the field, was spatulated, and sewn end-to-side to the common femoral  vein with a running 6-0 Prolene suture.  The clamps were removed from  the vein.  The graft was flushed with heparinized saline and reoccluded.  The new interposition grafts cut to appropriate length and was sewn end-  to-end to the old  graft with a running 6-0 Prolene suture.  Clamps were removed and  excellent thrill was noted.  The wound was irrigated with saline.  Hemostasis was achieved with the electrocautery.  Wound was closed with  a 3-0 Vicryl in the subcutaneous and subcuticular tissue.  Benzoin and  Steri-Strips were applied.      Rosetta Posner, M.D.  Electronically Signed     TFE/MEDQ  D:  07/20/2008  T:  07/21/2008  Job:  [id number]

## 2011-02-13 NOTE — Assessment & Plan Note (Signed)
OFFICE VISIT   Theodore Arellano, Theodore Arellano  DOB:  [birth year]                                       04/09/2007  [id number]   I saw Theodore Arellano in the office today to evaluate him for further access.  He had an upper arm fistula placed on the left which lasted about a year  and then he underwent a thrombectomy.  This failed and he subsequently  had an upper arm graft placed because he could not have a forearm graft  given the size of his antecubital veins.  He has had two thrombectomies  on his upper arm graft and now is to be evaluated for further access.  He is currently being dialyzed via a Diatek catheter.  He did have a  venogram done on June 30th which showed a widely patent right brachial  and basilic system.  The cephalic vein was noted to be diminutive in  size but patent.   I have recommended that we place an AV graft in the right arm.  If by  chance the cephalic vein distends up nicely and is useable for a  fistula, then obviously we would attempt a fistula.  However, I think  based on the results of his venogram more likely he will require an AV  graft.  Based on his fistulogram, it looks like he may very well require  an upper arm graft as the veins appear quite small.  His surgery has  been scheduled for a nondialysis day, which is April 25, 2007.  He  dialyzes typically Monday, Wednesdays, Fridays.   Judeth Cornfield. Scot Dock, M.D.  Electronically Signed   CSD/MEDQ  D:  04/09/2007  T:  04/10/2007  Job:  [id number]

## 2011-02-13 NOTE — Assessment & Plan Note (Signed)
OFFICE VISIT   Theodore, Arellano  DOB:  1960/11/01                                       04/25/2009  [id number]   REASON FOR VISIT:  Follow-up.   HISTORY:  This is a 50 year old gentleman who dialyzes through a right  thigh AV Gore-Tex graft placed in July 2009 and has been revised once.  He has developed progressive claudication in his right leg.  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  He has palpable pulses when his graft  is occluded.  I vein mapped him and found an adequate right-sided  cephalic vein.   He underwent a the right radiocephalic fistula on July 9 in an attempt  to find alternative access so that we could ligate his right graft.  He  has been doing well.  At this time, his claudication symptoms are  unchanged.  His cephalic vein appears to be dilating nicely.  I think it  does need some more time before it would potentially be usable for  access.  He is going to see me back in 1 month.  We will be very  aggressive to try to get this to work so that I can ligate his graft in  his leg to improve his lower extremity arterial insufficiency symptoms.   Eldridge Abrahams, MD  Electronically Signed   VWB/MEDQ  D:  04/25/2009  T:  04/27/2009  Job:  [id number]   cc:   Maudie Flakes. Hassell Done, M.D.

## 2011-02-13 NOTE — Op Note (Signed)
NAME:  Theodore Arellano, Theodore Arellano                 ACCOUNT NO.:  [id number]   MEDICAL RECORD NO.:  [id number]          PATIENT TYPE:  AMB   LOCATION:  SDS                          FACILITY:  [facility]   PHYSICIAN:  Judeth Cornfield. Scot Dock, M.D.DATE OF BIRTH:  [birth year]   DATE OF PROCEDURE:  04/25/2007  DATE OF DISCHARGE:  04/25/2007                               OPERATIVE REPORT   PREOPERATIVE DIAGNOSIS:  Chronic renal failure.   POSTOPERATIVE DIAGNOSIS:  Chronic renal failure.   PROCEDURE:  Placement of new right upper arm AV graft.   SURGEON:  Judeth Cornfield. Scot Dock, M.D.   ASSISTANT:  Tanya Nones, RNFA   ANESTHESIA:  Local with sedation.   TECHNIQUE:  The patient was taken to the operating room and sedated by  anesthesia.  The right upper extremity was prepped and draped in the  usual sterile fashion.  After the skin was infiltrated with 1% lidocaine  an incision was made over the cephalic vein.  It was quite small and not  useable as a fistula.  Therefore, a longitudinal incision was made over  the brachial artery and veins.  The brachial veins were very small.  I  tried to dilate them up but the largest of the two did not dilate up  very well and I did not think he was a candidate for a forearm graft.  Therefore, a separate longitudinal incision was made beneath the axilla  where the high axillary vein was dissected free.  It was a good sized  vein.  A 4 to 7 mm graft was then tunneled between the two incisions and  the patient was heparinized.  The brachial artery was clamped proximally  and distally and a longitudinal arteriotomy was made.  A segment of the  4 mm graft was excised, the graft spatulated and sewn end-to-side to the  artery using continuous 6-0 Prolene suture.  At the completion the graft  was the appropriate length for anastomosis to the axillary vein.  The  vein was ligated distally and spatulated proximally.  The graft was cut  the appropriate length, spatulated and sewn  end-to-end to the vein using  continuous 6-0 Prolene suture.  At completion there was a good thrill in  the graft and a radial and ulnar signal with the Doppler.  Hemostasis  was obtained in the wounds.  The wounds were closed with a deep layer of  3-0 Vicryl and the skin was closed with 4-0 Vicryl.  Sterile dressing  was applied.  The patient tolerated the procedure well and was  transferred to the recovery room in satisfactory condition.  All needle  and sponge counts were correct.      Judeth Cornfield. Scot Dock, M.D.  Electronically Signed     CSD/MEDQ  D:  04/25/2007  T:  04/25/2007  Job:  [id number]

## 2011-02-13 NOTE — Op Note (Signed)
NAME:  Theodore Arellano, Theodore Arellano                 ACCOUNT NO.:  [id number]   MEDICAL RECORD NO.:  [id number]          PATIENT TYPE:  AMB   LOCATION:  SDS                          FACILITY:  [facility]   PHYSICIAN:  Nelda Severe. Kellie Simmering, M.D.  DATE OF BIRTH:  1961-07-19   DATE OF PROCEDURE:  DATE OF DISCHARGE:  03/08/2008                               OPERATIVE REPORT   PREOP DIAGNOSIS:  End-stage renal disease.   POSTOP DIAGNOSIS:  End-stage renal disease.   OPERATION:  1. Bilateral ultrasound localization of internal jugular veins.  2. Insertion Diatek catheter via right internal jugular vein (28 cm).   SURGEON:  Nelda Severe. Kellie Simmering, M.D.   FIRST ASSISTANT:  Nurse.   ANESTHESIA:  Local.   PROCEDURE:  The patient was taken to the operating room, placed in  supine position, at which time, the upper chest and neck were exposed,  both internal jugular veins were imaged using B-mode ultrasound and  photographed.  Both noted to be widely patent.  After prepping and  draping in routine sterile manner, right internal jugular vein was  entered using a supraclavicular approach, guidewire passed into the  right atrium under fluoroscopic guidance.  After dilating the tract  appropriately, a 28-cm Diatek catheter was positioned in the right  atrium, tunneled peripherally, secured with nylon sutures, and the wound  closed with Vicryl in subcuticular fashion.  Sterile dressing applied.  The patient was taken to recovery room in satisfactory condition.      Nelda Severe Kellie Simmering, M.D.  Electronically Signed     JDL/MEDQ  D:  03/08/2008  T:  03/09/2008  Job:  [id number]

## 2011-02-13 NOTE — H&P (Signed)
NAME:  Theodore Arellano, MILCH NO.:  [id number]   MEDICAL RECORD NO.:  [id number]          PATIENT TYPE:  INP   LOCATION:  [id number]                         FACILITY:  [facility]   PHYSICIAN:  Zollie Scale, MD         DATE OF BIRTH:  July 06, 1961   DATE OF ADMISSION:  02/18/2009  DATE OF DISCHARGE:                              HISTORY & PHYSICAL   PRIMARY CARE [name]:  [facility] in [place], [place].   CHIEF COMPLAINT:  Chest pain.   HISTORY OF PRESENT ILLNESS:  Mr. Shirer is a 50 year old gentleman with  past medical history significant for end-stage renal disease,  hypertension, and hyperlipidemia, who presents to the emergency  department with an 8-day history of chest pain.  The patient reports  that when he first started experiencing chest pain he commented on it to  his primary care [name] who gave him a prescription for nitroglycerin  and arranged for him to be evaluated by cardiology.  That was earlier  today.  When the patient came home, he experienced chest pain, was  primarily in his right side of his chest, but also radiated to his  shoulders.  He attempted to take 1 nitroglycerin and noticed worsening  of the chest pain so he called EMS for evaluation.  The patient denies  any symptoms of nausea, shortness of breath, diaphoresis with the chest  pain.  When he presented to the emergency department, the chest pain had  totally resolved.  Of note, the patient has been on hemodialysis the  last 2 years and attributes his end-stage renal disease to hypertension  and the use of gout medications.  The patient obtains hemodialysis in  [place] on Tuesdays, Thursdays and Saturdays through a femoral AV  fistula.  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  The  patient denies any associated symptoms being associated  with any  activity and reported that he had been at rest this evening when the  chest pain occurred.  He reports the chest pain lasts 8-10 minutes and  has no other associated symptoms.   PAST MEDICAL HISTORY:  1. Hyperlipidemia.  2. Hypertension.  3. End-stage renal disease on hemodialysis as noted above.  4. Gout.  5. 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   MEDICATIONS:  1. Coreg 25 mg b.i.d.  2. Coumadin 1 mg daily.  3. Rena-Vite 1 tablet daily.  4. Amoxicillin p.r.n. for dental work.  5. Calcium acetate 667 mg.  6. Cyclobenzaprine 10 mg.  7. Hydrocodone and acetaminophen 5/500.  8. Hydroxyzine 25 mg.  9. Lisinopril 20 mg 1 tablet daily.  10.Lorazepam 1 mg p.r.n.  11.Nitroglycerin 0.4 mg p.r.n.  12.Sensipar 30 mg 1 tablet daily.  13.Simvastatin 40 mg 1 tablet daily.  14.Zolpidem 10 mg bedtime.   ALLERGIES:  The patient reports an allergy to BENADRYL.   FAMILY HISTORY:  Hypertension, diabetes and chronic  kidney disease.   SOCIAL HISTORY:  The patient lives in [place].  He is not married nor  does he have any children.  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  He smokes 1 pack per day of cigarettes and has done  so since the age of 50 cigarettes and has done  so since the age of 65.  Denies any alcohol or illicit drug use.   REVIEW OF SYSTEMS:  Complete review of systems is performed and is  otherwise negative but for what is shown in the history of present  illness.   PHYSICAL EXAMINATION:  VITAL SIGNS:  Temperature 97.9, blood pressure  109/77, pulse 81, respiratory rate 16, saturation 99% on room air.  GENERAL:  This is an [place] American male in no acute distress.  HEENT:  Patient is normocephalic, atraumatic.  Extraocular movements are  intact.  Pupils are equal and round.  Nares are patent bilaterally.  Mucous membranes are moist.  Oropharynx is clear without erythema or  exudate.  Dentition is poor.  NECK:  Supple with full range of motion.  No jugular  venous distention  noted.  LUNGS:  Clear to auscultation bilaterally without wheezes or rhonchi.  HEART:  Regular rate and rhythm without rubs, gallops or murmurs.  ABDOMEN:  Soft, nontender, nondistended.  Bowel sounds are active.  NEUROLOGIC:  Cranial nerves II-XII are intact.  Muscle strength is  symmetric.  Sensation is grossly normal.  MUSCULOSKELETAL:  No bony abnormalities, muscle tenderness or active  synovitis.  Of note, there is no reproducible chest pain on exam.  SKIN:  No rashes or lesions are noted.  MENTAL STATUS:  Alert and oriented x4.  Memory is intact to recent and  distant events.  Mood is appropriate, affect is full.   LABORATORY DATA:  D-dimer elevated at 1.05.  Cardiac biomarkers point of  care testing shows troponin of 0.1, CK of 2.6, CK with MB shows 166 with  3.3 MB, troponin 0.21.  Repeat troponin 0.27.  EKG shows normal sinus  rhythm with T-wave inversions in I, otherwise no ST or T-wave changes.   ASSESSMENT AND PLAN:  This is a 50 year old gentleman with end-stage  renal disease who presents with atypical chest pain most concerning for  musculoskeletal in nature, but with significant risk factors warrants  rule out and possible stress test  1. Chest pain.  Admit to a telemetry bed.  Rule out with serial      cardiac enzymes and plan for possible stress test in the morning.  2. End-stage renal disease.  The patient does not appear to have a BMP      in the computer at this point so we will go ahead and get a CMET.      The patient reports good compliance with his dialysis, with his      most recent dialysis session being today and next scheduled being      Saturday.  Since it is unlikely he will be here to need dialysis,      will hold off on consulting nephrology at this point.  3. Hypertension.  Will continue the patient on his Coreg and      lisinopril.  4. Hyperlipidemia.  Will continue the patient on Zocor and check a      fasting lipid profile.  5.  Prophylaxis.  Heparin 5000 units subcu q.8 h.  6. Fluids, electrolytes, nutrition.  Saline lock IV.  Renal size.      Monitor electrolytes.   ETHICS:  The patient is a full code.      Zollie Scale, MD  Electronically Signed     TJ/MEDQ  D:  02/18/2009  T:  02/18/2009  Job:  [id number]

## 2011-02-13 NOTE — Consult Note (Signed)
NAME:  JENNIS, BOTTE NO.:  [id number]   MEDICAL RECORD NO.:  [id number]          PATIENT TYPE:  INP   LOCATION:  [id number]                         FACILITY:  [facility]   PHYSICIAN:  Karma Lew, MD          DATE OF BIRTH:  10-01-1961   DATE OF CONSULTATION:  02/19/2009  DATE OF DISCHARGE:                                 CONSULTATION   REASON FOR CONSULTATION:  The patient is being seen in consultation at  the request of [facility] for evaluation of elevated cardiac  biomarkers.   HISTORY OF PRESENT ILLNESS:  The patient is a 50 year old African  American male with end-stage renal disease, hypertension,  hyperlipidemia, no prior past cardiac history who was admitted from the  emergency department and hospital service for complaints of chest  discomfort.  He describes the chest discomfort has been going on over  the past 7 days.  He has never had this before, not associated with the  exertion.  It usually occurs at night, last approximately 10 minutes at  a time.  Does have bilateral arm radiation.  Additionally, the patient  reports the movement makes the chest pain go away, has not significantly  been improved by nitroglycerin.  He has not had any type of cardiac  evaluation in the past.  Currently, he is chest pain free.  Over the  course of the hospitalization, he has had negative CK-MBs with a low-  level stable troponin elevation likely.   PAST MEDICAL HISTORY:  1. Hypertension.  2. Hyperlipidemia.  3. End-stage renal disease on hemodialysis.  4. Chronic anticoagulation secondary to thrombosis in the past.   MEDICATIONS:  1. Coreg 25 mg twice a day.  2. Coumadin 1mg  daily.  3. Rena-Vite 1 tab a day.  4. Calcium acetate 667 mg.  5. Cyclobenzaprine 10 mg a day.  6. Vicodin p.r.n.  7. Hydroxyzine 25 mg a day.  8. Lisinopril 20 mg once a day.  9. Nitroglycerin p.r.n.  10.Simvastatin 40 mg once a day.  11.Sensipar 30 mg a day.  12.Ambien p.r.n.   ALLERGIES:  Reports allergy to BENADRYL.   FAMILY HISTORY:  Reviewed and noncontributory to the patient's current  medical condition.   SOCIAL HISTORY:  Lives in [place], not married.  Smokes one pack a day  and that was since the age of 38.  No alcohol, no drug, but there is a  cocaine history per the chart.   REVIEW OF SYSTEMS:  Negative 11-point review of systems except for those  dictated in the above HPI.   PHYSICAL EXAMINATION:  VITAL SIGNS:  Blood pressure has ranged 120-91/75-  59, heart rate 82-72, sating 99% on room air.  GENERAL:  A well-developed, well-nourished African American male no  acute distress.  HEENT:  Moist mucous membranes.  Scleral icterus or conjunctival pallor.  NECK:  Supple.  Full range of motion.  No jugular venous distention.  No  carotid bruits noted.  CARDIOVASCULAR:  Regular rate and rhythm.  No murmurs, rubs, or gallops.  CHEST:  Clear to auscultation  bilaterally.  No wheezes, rales, or  rhonchi.  ABDOMEN:  Soft, nontender, nondistended.  Normoactive bowel sounds.  EXTREMITIES:  No peripheral edema.  Pulses are 2+ bilaterally.  NEURO:  Cranial nerves II through XII grossly intact.  Muscle strength  is symmetric and normal.  MUSCULOSKELETAL:  No joint effusions.  No tenderness.  SKIN:  No rashes.  No lesions.   LABORATORY DATA:  On review of his laboratory data, his initial troponin  was 0.1 and has elevated slightly to a peak of 0.59.  During that time,  his creatinine has also risen to a level of 15.  His CBC is significant  for a hemoglobin of 12.1, BUN and creatinine of 49 and 15.1, and normal  electrolytes.  EKG demonstrates normal sinus rhythm, left ventricular  hypertrophy, and no acute changes.  Chest x-ray demonstrates no acute  infiltrative process and no significant cardiomegaly.   IMPRESSION:  1. Atypical chest pain with multiple risk factors, and isolated      troponin elevation with negative CK-MB, and CPK .  2. End-stage renal  disease.  3. Hypertension.  4. Hyperlipidemia.   RECOMMENDATION:  I agree with the hospitalist to pursue an adenosine  nuclear stress test before discharge on Monday.  We will also consider 2-  D echocardiogram to evaluate his right heart, as well as he may have  some level of pulmonary hypertension with sleep apneas.  He does give a  snoring history that may be another cause for troponin elevation.  We  would obviously not initiate Heparin given his chronic anticoagulation,  continue his aspirin, continue statin, and continue his beta-blocker.  We would consider stop checking cardiac enzymes as the CK-MB and CPK  trend has been within normal limits and troponin elevation with chronic  renal disease is difficult to interpret at this time.  We will check an  a.m. EKG.      Karma Lew, MD  Electronically Signed     JT/MEDQ  D:  02/19/2009  T:  02/20/2009  Job:  [id number]

## 2011-02-13 NOTE — Assessment & Plan Note (Signed)
OFFICE VISIT   COBURN, LENGER  DOB:  1961/01/06                                       01/09/2010  [id number]   REASON FOR VISIT:  Nonmaturing fistula.   REFERRING PHYSICIAN:  Dr. Justin Mend   HISTORY:  This is a 50 year old gentleman who dialyzes through a right  thigh graft that was placed in July 2009.  It has only been revised  once.  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  I found that he had a  cephalic vein that was patent and so we attempted a right radiocephalic  fistula in July 2010.  This has not yet matured.  The patient continues  to dialyze on Tuesday, Thursday, Saturday.  He continues to be medically  managed for his hypertension and hypercholesterolemia.   REVIEW OF SYSTEMS:  CARDIAC:  Positive for chest tightness.  VASCULAR:  Positive pain in his legs with walking.  PSYCH:  Positive for depression and anxiety.  All other review of systems are negative as documented in the encounter  form.   FAMILY HISTORY:  Positive for cardiovascular disease in his father.   SOCIAL HISTORY:  He is single.  Currently smokes a half-pack a day.  Does not drink alcohol.   ALLERGIES:  To BENADRYL.   PHYSICAL EXAM:  Heart rate 75, blood pressure 155/83, temperature is  98.1.  General:  Well appearing, no distress.  HEENT:  Within normal  limits.  Lungs:  Clear bilaterally.  Cardiovascular:  Regular rate.  Abdomen:  Obese.  Musculoskeletal:  Without major deformities.  Neuro:  No focal weaknesses.  Skin:  Without rash.   DIAGNOSTIC STUDIES:  Vein mapping was performed today.  It has been  independently reviewed.  The patient has a trifurcation of his cephalic  vein just proximal to the anastomosis.  There is a medial branch which  drains into the basilic system.  There is a lateral branch which is the  cephalic vein, which has a tortuous course around the elbow and a small  component  of the elbow.  There is a branch in between the two without  flow.  There is anastomotic velocity of 540 cm/sec.   ASSESSMENT:  Nonmaturing fistula.   PLAN:  At this point, I think one of the branches needs to be ligated.  I am not sure which is the most promising vein for maturation.  Therefore, I think that the best course of action would be to proceed  with a fistulogram to see where these veins drain and to see which is  going to provide the best possibility for maturation.  Given the  patient's complex history of multiple grafts, obviously this is a last  ditch desperation effort to try to find him a new access.  He continues  to dialyze through a right thigh graft.  We may be limited to his legs  at this point in time.  However, we will evaluate this fistula this  coming Wednesday, April 13th.     Eldridge Abrahams, MD  Electronically Signed   VWB/MEDQ  D:  01/09/2010  T:  01/10/2010  Job:  [id number]   cc:   Sherril Croon, M.D.

## 2011-02-13 NOTE — Assessment & Plan Note (Signed)
OFFICE VISIT   Theodore Arellano, Theodore Arellano  DOB:  08-Apr-1961                                       03/22/2008  [id number]   REASON FOR VISIT:  Dialysis access.   HISTORY:  This is a 50 year old gentleman who has a history of bilateral  upper extremity catheters that have now thrombosed.  He underwent a  Diatek catheter placed on March 08, 2008.  No revision was performed at  that time.  He comes back today for discussion of additional permanent  access.   PHYSICAL EXAMINATION:  Blood pressure 115/72, pulse is 75.  He is well-  appearing in no acute distress.  His extremities are warm and well-  perfused.  He has palpable femoral pulses bilaterally.  He has palpable  pedal pulses bilaterally.  He has no swelling in either extremity.   ASSESSMENT/PLAN:  End-stage renal disease.   PLAN:  The patient is scheduled for a right thigh graft to be performed  on Wednesday, July 8th.  The risks and benefits were discussed with the  patient including bleeding, infection, steal syndrome.  The patient  understands he will be admitted after the procedure and monitored  overnight.  He is on Coumadin.  I have told him to stop his Coumadin on  July 3rd.  He dialyzes Tuesday, Thursday, Saturday.   Eldridge Abrahams, MD  Electronically Signed   VWB/MEDQ  D:  03/22/2008  T:  03/23/2008  Job:  [id number]

## 2011-02-13 NOTE — Op Note (Signed)
NAME:  CAINE, SIHARATH                 ACCOUNT NO.:  [id number]   MEDICAL RECORD NO.:  [id number]          PATIENT TYPE:  AMB   LOCATION:  SDS                          FACILITY:  [facility]   PHYSICIAN:  Nelda Severe. Kellie Simmering, M.D.  DATE OF BIRTH:  12-19-1960   DATE OF PROCEDURE:  03/24/2007  DATE OF DISCHARGE:  03/24/2007                               OPERATIVE REPORT   PREOPERATIVE DIAGNOSIS:  End-stage renal disease with multiple  thrombosed left upper arm grafts.   POSTOPERATIVE DIAGNOSIS:  End-stage renal disease with multiple  thrombosed left upper arm grafts.   OPERATION:  1. Bilateral ultrasound localization of internal jugular veins.  2. Insertion of Diatek catheter via right internal jugular vein (28      cm).   SURGEON:  Nelda Severe. Kellie Simmering, M.D.   FIRST ASSISTANT:  Nurse.   ANESTHESIA:  Local.   PROCEDURE:  The patient was taken to the operating room and placed in a  supine position, at which time the upper chest and neck were exposed.  Both internal jugular veins were then imaged using B-mode ultrasound and  both noted to be widely patent with normal-appearing flow.  After  prepping and draping in a routine sterile manner, the right internal  jugular vein was entered using a supraclavicular approach and guidewire  passed into the inferior vena cava under fluoroscopic guidance.  After  dilating the tract appropriately, a 28-cm Diatek catheter was passed  through a peel-away sheath, positioned in the right atrium, tunneled  peripherally and secured with nylon sutures.  The wound was closed with  Vicryl in a subcuticular fashion, sterile dressing applied and the  patient taken to the recovery room in satisfactory condition.      Nelda Severe Kellie Simmering, M.D.  Electronically Signed     JDL/MEDQ  D:  03/24/2007  T:  03/25/2007  Job:  [id number]

## 2011-02-13 NOTE — Assessment & Plan Note (Signed)
OFFICE VISIT   DONIEL, KUEHNLE  DOB:  Oct 21, 1960                                       01/23/2010  [id number]   REASON FOR VISIT:  Access.   HISTORY:  This is a 50 year old gentleman who dialyzes through a right  thigh graft which he is stealing from. He had multiple upper arm access  procedures.  As a  desperation attempt we  performed a right  radiocephalic fistula.  This was recently evaluated by a fistulogram  which shows that it is not going to mature adequately, however, an  adequate size basilic vein was present.  It did, however, dive into the  deep system in the mid arm. He comes back in today to discuss his  options.   PHYSICAL EXAMINATION:  VITAL SIGNS:  Heart rate is 84, blood pressure  128/80, temperature is 90.1.  GENERAL:  He is well-appearing in no distress.  CARDIOVASCULAR:  Regular rate and rhythm.  LUNGS:  Clear bilaterally.  There is a thrill within the fistula.  However, is not prominent   Ultrasound of the patient's basilic vein from the midforearm to the  midhumeral region is of adequate caliber.  He does dive into the  deep  system and the midhumerus, I think that we could perform basilic vein  transposition using this and potentially get him a functioning  fistula.  He understands that this may not work but is the best chance of getting  his thigh graft taken out.Marland Kitchen  He has this  scheduled for Tuesday, May 3.     Eldridge Abrahams, MD  Electronically Signed   VWB/MEDQ  D:  01/23/2010  T:  01/24/2010  Job:  [id number]   cc:   Sherril Croon, M.D.

## 2011-02-13 NOTE — Assessment & Plan Note (Signed)
OFFICE VISIT   VANSON, SOLDAN  DOB:  1960/11/21                                       03/28/2009  [id number]   REASON FOR VISIT:  Evaluate right leg pain.   HISTORY:  Mr. Yatsko is a 50 year old gentleman who underwent right thigh  AV Gore-Tex graft in July 2009.  He has had this revised once.  He has  developed progressive claudication symptoms in his right leg, such that  these are now becoming lifestyle limiting.  Prior placing his right  thigh graft, he had palpable pedal pulses.  He comes in today for  further evaluation.  I had ultrasound performed today with graft  compression.  He has return of a palpable dorsalis pedis pulse.  This is  consistent with steal syndrome without graft compression.  His ankle  brachial index is 0.46.  The patient's right arm was also vein mapped.  He does have a cephalic vein measuring 0000000 in the lower arm as well  as a large branch lateral to the cephalic vein.  I am not able to follow  the cephalic vein up above the arm crease as at the antecubital crease  it becomes very small, I think at that the patient will need ligation of  his right thigh graft to alleviate his claudication symptoms, but I  would like to have something in place prior to doing so.  We have  decided to proceed with a right radiocephalic fistula to be performed on  Friday. July 9th.  This may also require ligation of his branch.  On  examination, he does have a palpable right radial pulse.  I do not think  that we need to ligate his right thigh graft at this time, but we can do  so once another access is functional.  Obviously, if he develops an  ulcer or pain that is worsening we will do this sooner.   Eldridge Abrahams, MD  Electronically Signed   VWB/MEDQ  D:  03/28/2009  T:  03/29/2009  Job:  [id number]   cc:   Maudie Flakes. Hassell Done, M.D.

## 2011-02-13 NOTE — Op Note (Signed)
NAME:  Theodore Arellano, Theodore Arellano                 ACCOUNT NO.:  [id number]   MEDICAL RECORD NO.:  [id number]          Arellano TYPE:  INP   LOCATION:  6739                         FACILITY:  [facility]   PHYSICIAN:  Theotis Burrow IV, MDDATE OF BIRTH:  09/18/1961   DATE OF PROCEDURE:  04/07/2008  DATE OF DISCHARGE:                               OPERATIVE REPORT   PREOPERATIVE DIAGNOSIS:  End-stage renal disease.   POSTOPERATIVE DIAGNOSIS:  End-stage renal disease.   PROCEDURE PERFORMED:  Right thigh loop graft.   SURGEON:  1. Leia Alf, MD   ASSISTANT:  Jacinta Shoe, PA.   ANESTHESIA:  General.   ESTIMATED BLOOD LOSS:  50 mL.   COMPLICATIONS:  None.   PROCEDURE:  Theodore Arellano was identified in Theodore holding area and taken to  room 6.  He was placed supine on Theodore table.  Theodore right leg was prepped  and draped in Theodore standard sterile fashion.  Time-out was called.  Antibiotics were given.  A longitudinal incision was made over Theodore  palpable pulse of Theodore common femoral artery.  Cautery was used to  dissect Theodore subcutaneous tissue.  Self-retaining retractors were used to  aid Theodore exposure.  Theodore femoral sheath was identified and opened sharply.  Theodore superficial femoral artery was readily identified.  I dissected back  to Theodore common femoral as well as Theodore profunda femoral arteries.  Once  adequate mobilization of Theodore superficial femoral artery, attention was  turned towards Theodore vein.  Theodore saphenofemoral junction was identified.  Control of Theodore femoral vein was completed and vessel loops were used to  encircle Theodore common femoral vein proximal and distal to Theodore  saphenofemoral junction.  A vessel loop was also used to encircle Theodore  saphenous vein.  Next, a counter incision in Theodore mid thigh was made and  a straight tunnel was used to create a loop tunnel for a graft.  A 6-mm  stretch Gore-Tex graft was brought through Theodore tunnel.  At this in point  time, Theodore Arellano was given systemic  heparinization.  Theodore arterial  anastomosis was performed first.  A fistula clamp was used to occlude  Theodore superficial femoral artery proximally and a serrefine clamp was used  distally on Theodore superficial femoral artery.  A #11 blade was used to  make an arteriotomy and Potts scissors were used to extend this  proximally and distally.  There was a plaque noted on Theodore proximal  lateral aspect of Theodore superficial femoral artery.  Theodore graft was then  beveled to fit Theodore size of Theodore arteriotomy.  A running anastomosis was  created using a 6-0 Prolene.  Once Theodore anastomosis was complete, Theodore  clamps on Theodore artery were released.  There was excellent arterial flow  through Theodore graft.  Theodore graft was then occluded and then flushed with  heparin and saline.  At this point in time, Theodore venous anastomosis was  performed.  I elected to transect Theodore saphenous vein at Theodore level of Theodore  saphenofemoral junction.  Theodore proximal saphenous vein was ligated with  two 2-0 silk ties and metal clip.  Theodore saphenofemoral junction was then  opened longitudinally.  Theodore Gore-Tex graft was then spatulated to fit  Theodore size of Theodore venotomy.  A running anastomosis with 6-0 Prolene was  then created.  Prior to completion, Theodore graft was appropriately flushed.  Theodore anastomosis was then secured and  clamps were released.  There was  excellent thrill within Theodore graft.  Doppler was used to evaluate Theodore  femoral artery distal to Theodore graft and there was a multiphasic signal.  At this point in time, Theodore Arellano's heparin was reversed with 50 mg  protamine.  Theodore wound was then copiously irrigated.  Hemostasis was  achieved.  Theodore deep tissue was closed with a 2-0 Vicryl.  Subcutaneous  tissue was closed with 3-0 Vicryl and 4-0 Vicryl was used to close Theodore  skin.  Theodore counter incision in Theodore mid thigh 3-0 Vicryl was used to  close Theodore incision.  Dermabond and Steri-Strips were placed.  Theodore  Arellano was then successfully extubated and  taken to recovery room in  stable condition.  Again, there were no complications.           ______________________________  V. Leia Alf, MD  Electronically Signed     VWB/MEDQ  D:  04/07/2008  T:  04/08/2008  Job:  [id number]

## 2011-02-13 NOTE — Op Note (Signed)
NAME:  Theodore Arellano, Theodore Arellano NO.:  [id number]   MEDICAL RECORD NO.:  [id number]          PATIENT TYPE:  INP   LOCATION:  6729                         FACILITY:  [facility]   PHYSICIAN:  Rosetta Posner, M.D.    DATE OF BIRTH:  1961-01-12   DATE OF PROCEDURE:  10/26/2007  DATE OF DISCHARGE:  10/26/2007                               OPERATIVE REPORT   PREOPERATIVE DIAGNOSIS:  End-stage renal disease.   POSTOPERATIVE DIAGNOSIS:  End-stage renal disease.   PROCEDURE:  Thrombectomy of right upper arm AV Gore-Tex graft and Gore-  Tex patch angioplasty of venous anastomosis.   SURGEON:  Rosetta Posner, M.D.   ASSISTANT:  Nurse.   ANESTHESIA:  MAC.   COMPLICATIONS:  None.   CONDITION:  To recovery room stable.   DESCRIPTION OF PROCEDURE:  The patient was taken to the operating room  and placed in the supine position where the area of the right arm and  right axilla were prepped and draped in the usual sterile fashion.  Incision was made over the prior venous anastomosis and isolated the  graft to vein anastomosis.  The graft was opened longitudinally through  the old venous anastomosis and there was a stenosis right at the venous  anastomosis.  The vein above this was a large caliber.  There was some  thrombus proximally and a Fogarty catheter was passed proximally and  good venous back bleeding was encountered.  This was flushed with  heparinized saline and reoccluded.  Next the graft itself was  thrombectomized, the arterialized plug was removed and excellent inflow  was encountered.  A 0.4 mm Gore-Tex patch was brought onto the field,  cut to the appropriate dimension and was sewn as a patch angioplasty  over the venous anastomosis.  Clamps were removed and excellent thrill  was noted.  The wound was irrigated with saline, hemostasis with  electrocautery, wounds were closed with 3-0 Vicryl in the subcutaneous  and subcuticular tissue.  Benzoin and Steri-Strips were  applied.      Rosetta Posner, M.D.  Electronically Signed     TFE/MEDQ  D:  10/26/2007  T:  10/26/2007  Job:  [id number]

## 2011-02-13 NOTE — Procedures (Signed)
VASCULAR LAB EXAM   INDICATION:  Right AVF, not maturing.   HISTORY:  Diabetes:  No.  Cardiac:  No.  Hypertension:  Yes.   EXAM:  Right AVF duplex.   IMPRESSION:  1. Patent right arteriovenous fistula with multiple branchings, as      shown on attached drawing.  2. Right radial artery distal to arteriovenous fistula has somewhat to      and fro waveform morphology.   ___________________________________________  V. Leia Alf, MD   AS/MEDQ  D:  01/09/2010  T:  01/10/2010  Job:  [id number]

## 2011-02-13 NOTE — Op Note (Signed)
NAMEAMALIO, Theodore Arellano                 ACCOUNT NO.:  [id number]   MEDICAL RECORD NO.:  [id number]          PATIENT TYPE:  AMB   LOCATION:  SDS                          FACILITY:  [facility]   PHYSICIAN:  Theotis Burrow IV, MDDATE OF BIRTH:  Sep 20, 1961   DATE OF PROCEDURE:  04/08/2009  DATE OF DISCHARGE:  04/08/2009                               OPERATIVE REPORT   PREOPERATIVE DIAGNOSIS:  End-stage renal disease.   POSTOPERATIVE DIAGNOSIS:  End-stage renal disease.   PROCEDURE PERFORMED:  Right radiocephalic fistula.   ANESTHESIA:  General.   COMPLICATIONS:  None.   INDICATIONS:  Mr. Bundick is a 50 year old gentleman who is currently  dialyzing through a right thigh AV Gore-Tex graft which was placed in  July 2009.  This has been revised one time.  He did develop progressive  claudication in his right leg.  He say they are now nearing lifestyle-  limiting symptoms.  Ultrasound demonstrated that the patient had  returned to near normal ABIs, bone graft occlusion.  This is consistent  with this pre-thigh graft palpable pulses.  I think he is going to  require ligation of this graft in the near future, therefore we are  trying to find an access for him.  He had vein mapping performed of both  arms and this found that he had an adequate right-sided cephalic vein.  He has had a right upper arm Gore-Tex graft, but never fistula on the  right side.  I cannot evaluate for central stenosis as he has a CONTRAST  allergy, therefore we have decided to proceed with a right wrist fistula  to see if we can get this to mature so that we can ligate his thigh  graft.   PROCEDURE:  The patient was identified in the holding area and taken to  room #6.  He was placed supine on the table.  General anesthesia was  administered.  The patient was prepped and draped in the standard  sterile fashion.  A time-out was called.  Antibiotics were given.  Ultrasound was used to identify the cephalic vein and traced  this course  throughout the upper arm.  A transverse incision was then made 2  fingerbreadths proximal to the radial head.  Tissue was divided with  cautery.  The cephalic vein was identified.  It was a large vein  approximately 3-3.5 mm.  Vein was mobilized proximally and distally  until adequate exposure had been completed.  It was marked with an ink  pen to ensure proper orientation.  Next, the radial artery was dissected  out.  Vessel loops were placed proximally and distally.  I then gave the  patient 3000 units of heparin.  The cephalic vein was transected  distally and the distal end tied off with 2-0 silk tie.  I tried to pass  a #3 Fogarty into the central venous system.  I was able to get up to  the shoulder where I had trouble passing the Fogarty.  I also tried to  pass a 4, but I was unable to.  There was backbleeding.  I could not  inject CONTRAST due to the patient's CONTRAST allergy, therefore I  elected to proceed with the fistula creation.  A #11 blade was used to  make an arteriotomy in the radial artery which was extended with Potts  scissors.  The vein was then cut to the appropriate length and  spatulated to fit the size of the arteriotomy.  A running end-to-side  anastomosis was created with 6-0 Prolene.  Prior to completion, the  artery was flushed in the antegrade and retrograde fashion.  The  anastomosis was then secured.  I then inspected the course of cephalic  vein to make sure that there were no kinks.  It appeared to dilate  nicely.  I could hear a good Doppler signal at the level of the  antecubital crease.  He also had good Doppler signal of the radial  artery proximal and distal  anastomosis.  With these findings, I elected to terminate the procedure.  The deep tissue was reapproximated with 3-0 Vicryl and skin was closed  with 3-0 Vicryl.  Dermabond was placed in the skin.  The patient  tolerated the procedure well and taken to the recovery room in  stable  condition.      Theodore Abrahams, MD  Electronically Signed     VWB/MEDQ  D:  04/08/2009  T:  04/09/2009  Job:  [id number]

## 2011-02-13 NOTE — Op Note (Signed)
NAME:  Theodore Arellano, Theodore Arellano                 ACCOUNT NO.:  [id number]   MEDICAL RECORD NO.:  [id number]          PATIENT TYPE:  AMB   LOCATION:  SDS                          FACILITY:  [facility]   PHYSICIAN:  Theotis Burrow IV, MDDATE OF BIRTH:  Jan 27, 1961   DATE OF PROCEDURE:  01/15/2008  DATE OF DISCHARGE:  01/15/2008                               OPERATIVE REPORT   PREOPERATIVE DIAGNOSIS:  Clotted right upper arm graft.   POSTOPERATIVE DIAGNOSIS:  Clotted right upper arm graft.   PROCEDURE PERFORMED:  Simple thrombectomy right upper arm graft.   ANESTHESIA:  MAC.   BLOOD LOSS:  100 mL.   FINDINGS:  No neointimal hyperplasia at the venous anastomosis,  excellent arterial inflow.   PROCEDURE:  The patient was brought to the holding area, taken to room A  where he was placed supine on the table.  MAC anesthesia was  administered.  The right arm was prepped and draped in standard sterile  fashion.  A time-out was called and antibiotics were given.  1%  lidocaine was used for local anesthesia.  Incision was made in the right  axillary region to the patient's previous scar.  Bovie cautery was used  to dissect the subcutaneous tissue.  The patient's graft was readily  identified and circumferentially isolated with the Metzenbaum scissors.  The patient had a previous Gore-Tex graft that had been repaired with  patch angioplasty.  The proximal portion of the axillary vein was  dilated to approximately 6 mm.  The patient was given systemic  heparinization.  A longitudinal incision was made along the patch.  This  was done with a #11 blade.  A #4 and #6 Fogarty were used  to perform  central thrombectomy.  Excellent back-bleeding was encountered.  Next,  the arterial plug was removed with a #4 Fogarty and excellent inflow was  encountered.  The graft was thrombectomized in additional time to make  sure there was no residual thrombus.  The graft was flushed with  heparinized saline and then  reoccluded.  I inspected the venous outflow  and did not see any neointimal hyperplasia.  There was no stenosis  present.  For that reason, I elected to simply close the patch.  This  was done with a running 6-0 Prolene.  There was excellent thrill within  the graft following closure of the graftotomy.  The patient had a  palpable radial pulse next achieved.  The deep tissue and subcutaneous  tissue were closed with running 3-0 Vicryl.  The skin was closed with 4-  0 Vicryl.  The patient tolerated the procedure well and was taken to  recovery room in stable condition.           ______________________________  V. Leia Alf, MD  Electronically Signed     VWB/MEDQ  D:  01/15/2008  T:  01/16/2008  Job:  [id number]

## 2011-02-16 NOTE — Op Note (Signed)
NAME:  Theodore Arellano, Theodore Arellano                 ACCOUNT NO.:  [id number]   MEDICAL RECORD NO.:  [id number]          PATIENT TYPE:  AMB   LOCATION:  SDS                          FACILITY:  [facility]   PHYSICIAN:  Judeth Cornfield. Scot Dock, M.D.DATE OF BIRTH:  [birth year]   DATE OF PROCEDURE:  12/20/2005  DATE OF DISCHARGE:                                 OPERATIVE REPORT   PREOPERATIVE DIAGNOSIS:  Chronic renal failure.   POSTOPERATIVE DIAGNOSIS:  Chronic renal failure.   PROCEDURE:  Placement of left upper arm AV fistula.   SURGEON:  Judeth Cornfield. Scot Dock, M.D.   ASSISTANT:  Suzzanne Cloud, P.A.   ANESTHESIA:  Local with sedation.   TECHNIQUE:  The patient was taken to the operating and sedated by  anesthesia. The left upper extremity was prepped and draped in the usual  sterile fashion. After the skin was infiltrated with 1% lidocaine a  transverse incision was made just above the antecubital level. Here the  cephalic vein was dissected free. It was somewhat on the small side.  However, after I ligated it distally it then distend up fairly nicely with  heparinized saline;  and I passed a Fogarty catheter the entire length  without evidence of obstruction. The brachial artery was dissected free  beneath the fascia. The patient was then heparinized. The brachial artery  was clipped proximally and distally and a longitudinal arteriotomy was made.  The vein was mobilized over and sewn end-to-side to the artery using  continuous 6-0 Prolene suture. At completion, there was a good thrill in the  fistula. Hemostasis was obtained in the wound, and the heparin was partially  reversed with protamine. The wound was closed with deep layer of 3-0 Vicryl.  The skin closed with 4-0 Vicryl. A sterile dressing was applied. The patient  tolerated the procedure well and was transferred to the recovery room in  satisfactory condition. All needle and sponge counts were correct.      Judeth Cornfield. Scot Dock, M.D.  Electronically Signed     CSD/MEDQ  D:  12/20/2005  T:  12/21/2005  Job:  [id number]

## 2011-02-16 NOTE — Discharge Summary (Signed)
NAME:  NTHONY, TRIPLETT NO.:  [id number]   MEDICAL RECORD NO.:  [id number]          PATIENT TYPE:  INP   LOCATION:  6729                         FACILITY:  [facility]   PHYSICIAN:  Sol Blazing, M.D.DATE OF BIRTH:  1961/02/19   DATE OF ADMISSION:  10/25/2007  DATE OF DISCHARGE:  10/26/2007                               DISCHARGE SUMMARY   The patient admitted to Dr. Roney Jaffe.   DATE OF ADMISSION:  October 25, 2007.   DATE OF DISCHARGE:  October 26, 2007.   ADMITTING DIAGNOSES:  1. Hyperkalemia.  2. Clotted right upper arm AV GORE-TEX graft.  3. End-stage renal disease with noncompliance issues.  4. Hypertension uncontrolled.  5. Gout.  6. Secondary hyperparathyroidism.   DISCHARGE DIAGNOSIS:  1. Hyperkalemia corrected with the emergency dialysis.  2. Status post thrombectomy and revision of the right upper arm AV      GORE-TEX graft October 26, 2007 by Dr. Curt Jews.  3. Status post temporary right femoral dialysis catheter placement      October 25, 2007 Dr. Roney Jaffe.  Removed prior to discharge.  4. Hypertension improved.  5. End-stage renal disease with noncompliance issues.  6. Secondary hyperparathyroidism.  7. Gout.   BRIEF HISTORY:  A 50 year old black male with end-stage renal disease on  chronic hemodialysis every Tuesday, Thursday, Saturday at the [facility] with his last dialysis being done on October 18, 2007  presents to outpatient dialysis unit with a clotted right upper arm AV  GORE-TEX graft.  He was sent over to [facility] where his  potassium was found to be 7.7.  The patient arrived to [facility] via  private vehicle having stopped for lunch en route.  He is admitted for  emergency hemodialysis after femoral catheter placement for  hyperkalemia.   LABS ON ADMISSION:  Potassium 7.7 as reported at [facility] emergency  room.   HOSPITAL COURSE:  Upon admission Dr. Roney Jaffe placed a right  femoral temporary 20 cm dialysis catheter using ultrasound guidance.  There were no complications.  He underwent dialysis using 2 hours of  zero potassium bath.  He tolerated treatment well running a total of 3  hours and then signed off early.  4 liters of ultrafiltrate were  removed.  Due to the patient having eaten, surgery was postponed until  the following morning.  At that time, on day of discharge, he underwent  a thrombectomy and revision of the right arm AV GORE-TEX graft which he  tolerated well.  Preoperative potassium was 4.6.  Postoperatively he was  discharged in stable condition and instructed to resume his home  medications.  He was strongly encouraged to attend dialysis on a regular  basis every Tuesday, Thursday, and Saturday at the [facility].   DISCHARGE MEDICATIONS:  1. Nephro-Vite vitamin 1 daily.  2. Atacand 32 mg nightly.  3. Carvedilol 25 mg b.i.d.  4. Coumadin 2 mg q. p.m.  5. PhosLo 667 mg 2 with meals.  6. Colchicine 0.6 mg daily.  7. Alprazolam 0.5 mg 1-2 pills q.12h  p.r.n. anxiety.      Nonah Mattes, P.A.      Sol Blazing, M.D.  Electronically Signed    RRK/MEDQ  D:  12/13/2007  T:  12/14/2007  Job:  [id number]

## 2011-02-16 NOTE — Op Note (Signed)
NAME:  Theodore Arellano, Theodore Arellano NO.:  [id number]   MEDICAL RECORD NO.:  [id number]          PATIENT TYPE:  OBV   LOCATION:  2550                         FACILITY:  [facility]   PHYSICIAN:  Judeth Cornfield. Scot Dock, M.D.DATE OF BIRTH:  [birth year]   DATE OF PROCEDURE:  01/05/2007  DATE OF DISCHARGE:                               OPERATIVE REPORT   PREOPERATIVE DIAGNOSIS:  Chronic renal failure with clotted left upper  arm AV fistula.   POSTOPERATIVE DIAGNOSIS:  Chronic renal failure with clotted left upper  arm AV fistula.   PROCEDURES:  1. Thrombectomy of left upper arm AV fistula.  2. Vein patch angioplasty of stenosis within the fistula.   SURGEON:  Judeth Cornfield. Scot Dock, M.D.   ASSISTANT:  Nurse.   ANESTHESIA:  Local with sedation.   INDICATIONS:  This is a 50 year old gentleman who had a left upper arm  fistula placed a year ago.  He began dialysis about a week ago and it  has been used 3 times.  The graft clotted.  He presented for possible  thrombectomy.  Of note, he had significant warmth in the forearm and  problems with gout at the wrist.  We felt we would attempt thrombectomy,  and if this was not possible would have to place a catheter.  I did not  think it would be wise to place a graft at this point, given his  problems with the gout in this same arm.   TECHNIQUE:  The patient was taken to the operating room and sedated by  anesthesia.  The left upper extremity was prepped and draped in the  usual sterile fashion.  A longitudinal incision was made over the  fistula above the antecubital level, where the vein was dissected free;  it was a nice size vein.  It was divided transversely after the patient  was heparinized.  Graft thrombectomy was achieved using a #4 Fogarty  catheter.  The proximal end was widely open and with excellent inflow.  There appeared to be a stenosis at the junction of the mid to upper  third of the arm, and this was marked.  It also  appeared to be some  stenosis up near the clavicle.  I elected to explore the area within the  arm that I could get at surgically.  This area was exposed through a  longitudinal incision.  There was a large branch, which I dissected out,  divided and opened longitudinally to be used as a vein patch.  The area  of stenosis was identified and the vein was opened longitudinally, after  it was clamped proximally and distally.  The previous venotomy  had been  closed transversely with running 6-0 Prolene suture.  There was a vein  valve stenosis with insignificant intimal hyperplasia, and I sharply  excised the area that had  intimal hyperplasia.  I then opened the vein  patch; and this was used and sewn with continuous 6-0 Prolene suture.  At the completion, the graft was still slightly pulsatile -- likely  related to the more central venous stenosis.  The incisions were closed with a deep layer of 3-0 Vicryl.  The skin was  closed with 4-0 Vicryl.  I scheduled him to have a fistulogram with  possible angioplasty of the more proximal stenosis, which could not be  addressed surgically.  Sterile dressings were applied.  The patient  tolerated the procedure well and was transferred to the recovery room in  satisfactory condition.  All needle and sponge counts were correct.      Judeth Cornfield. Scot Dock, M.D.  Electronically Signed     CSD/MEDQ  D:  01/05/2007  T:  01/05/2007  Job:  [id number]

## 2011-02-16 NOTE — Op Note (Signed)
NAME:  Theodore Arellano, Theodore Arellano                 ACCOUNT NO.:  [id number]   MEDICAL RECORD NO.:  [id number]          PATIENT TYPE:  AMB   LOCATION:  SDS                          FACILITY:  [place]   PHYSICIAN:  Judeth Cornfield. Scot Dock, M.D.DATE OF BIRTH:  09-Aug-1961   DATE OF PROCEDURE:  01/24/2007  DATE OF DISCHARGE:                               OPERATIVE REPORT   PREOPERATIVE DIAGNOSIS:  Chronic renal failure.   POSTOPERATIVE DIAGNOSIS:  Chronic renal failure.   PROCEDURE:  Insertion of new left upper arm AV graft.   SURGEON:  Dr. Scot Dock   ASSISTANT:  Myra Gianotti, PA-C   ANESTHESIA:  Local with sedation.   TECHNIQUE:  The patient was taken to the operating room, sedated by  anesthesia.  The left upper extremity was prepped and draped in the  usual sterile fashion.  After the skin was infiltrated with 1%  lidocaine, a longitudinal incision was made over the brachial artery  just above the antecubital space where the old fistula was dissected  free.  This was occluded.  The vein was ligated and divided.  The  brachial artery was dissected free down proximal and distal to the  anastomosis, and it was a reasonable sized artery.  The adjacent veins,  however, were quite small, and I did not think he was a candidate for a  forearm graft.  After the skin was anesthetized with 1% lidocaine, a  longitudinal incision was made beneath the axilla where the high  brachial vein was dissected free, and it was an excellent size vein.  A  4-7 mm graft was then tunneled between the 2 incisions, and the patient  was heparinized.  The brachial artery was clamped proximally and  distally.  The remnant of vein from the fistula was excised from the  artery and the arteriotomy extended slightly.  The 4 mm end of the graft  was spatulated and sewn end-to-side to the artery using continuous 6-0  Prolene suture.  The graft was then pulled to the appropriate length for  an anastomosis to the high brachial vein.   The vein was ligated  distally, spatulated proximally.  The graft was cut to the appropriate  length, spatulated, and sewn end-to-end to the vein using continuous 6-0  Prolene suture.  At the completion, there was an excellent thrill in the  graft.  Hemostasis was obtained in the wounds.  The wounds were closed  with a deep layer of 3-0 Vicryl, the skin closed with 4-0 Vicryl.  A  sterile dressing was applied. The patient tolerated the procedure well  and was transferred to the recovery room in satisfactory condition.  All  needle and sponge counts were correct.      Judeth Cornfield. Scot Dock, M.D.  Electronically Signed     CSD/MEDQ  D:  01/24/2007  T:  01/24/2007  Job:  [id number]

## 2011-06-21 LAB — RENAL FUNCTION PANEL
Albumin: 3.7
BUN: 62 — ABNORMAL HIGH
CO2: 24
Calcium: 9.3
Chloride: 101
Creatinine, Ser: 11.91 — ABNORMAL HIGH
GFR calc Af Amer: 6 — ABNORMAL LOW
GFR calc non Af Amer: 5 — ABNORMAL LOW
Glucose, Bld: 94
Phosphorus: 7.7 — ABNORMAL HIGH
Potassium: 4.6
Sodium: 140

## 2011-06-21 LAB — CBC
HCT: 39.7
Hemoglobin: 13.3
MCHC: 33.5
MCV: 92.3
Platelets: 225
RBC: 4.3
RDW: 16.2 — ABNORMAL HIGH
WBC: 8.3

## 2011-06-26 LAB — POCT I-STAT 4, (NA,K, GLUC, HGB,HCT)
Glucose, Bld: 67 — ABNORMAL LOW
HCT: 50
Hemoglobin: 17
Operator id: 206361
Potassium: 5.3 — ABNORMAL HIGH
Sodium: 132 — ABNORMAL LOW

## 2011-06-26 LAB — APTT: aPTT: 34

## 2011-06-26 LAB — PROTIME-INR
INR: 0.9
Prothrombin Time: 12.3

## 2011-06-27 LAB — POCT I-STAT 4, (NA,K, GLUC, HGB,HCT)
Glucose, Bld: 76
HCT: 44
Hemoglobin: 15
Operator id: 274481
Potassium: 4.3
Sodium: 142

## 2011-06-27 LAB — PROTIME-INR
INR: 1
Prothrombin Time: 13.1

## 2011-06-27 LAB — APTT: aPTT: 36

## 2011-06-28 LAB — POCT I-STAT 4, (NA,K, GLUC, HGB,HCT)
Glucose, Bld: 99
Glucose, Bld: 107 — ABNORMAL HIGH
HCT: 34 — ABNORMAL LOW
HCT: 32 — ABNORMAL LOW
Hemoglobin: 11.6 — ABNORMAL LOW
Hemoglobin: 10.9 — ABNORMAL LOW
Operator id: 181601
Operator id: 181601
Potassium: 4
Potassium: 5
Sodium: 136
Sodium: 133 — ABNORMAL LOW

## 2011-06-28 LAB — PROTIME-INR
INR: 1
INR: 1
Prothrombin Time: 13.1
Prothrombin Time: 13.8

## 2011-06-28 LAB — CBC
HCT: 30.4 — ABNORMAL LOW
Hemoglobin: 11 — ABNORMAL LOW
MCHC: 36
MCV: 94.6
Platelets: 208
RBC: 3.22 — ABNORMAL LOW
RDW: 15.6 — ABNORMAL HIGH
WBC: 10

## 2011-06-28 LAB — RENAL FUNCTION PANEL
Albumin: 3.1 — ABNORMAL LOW
BUN: 110 — ABNORMAL HIGH
CO2: 22
Calcium: 8.8
Chloride: 103
Creatinine, Ser: 17.58 — ABNORMAL HIGH
GFR calc Af Amer: 4 — ABNORMAL LOW
GFR calc non Af Amer: 3 — ABNORMAL LOW
Glucose, Bld: 92
Phosphorus: 7 — ABNORMAL HIGH
Potassium: 5.5 — ABNORMAL HIGH
Sodium: 139

## 2011-06-28 LAB — APTT: aPTT: 33

## 2011-07-02 LAB — POCT I-STAT, CHEM 8
BUN: 66 — ABNORMAL HIGH
Calcium, Ion: 1.25
Chloride: 101
Creatinine, Ser: 13.9 — ABNORMAL HIGH
Glucose, Bld: 87
HCT: 41
Hemoglobin: 13.9
Potassium: 3.7
Sodium: 136
TCO2: 25

## 2011-07-02 LAB — POCT I-STAT 4, (NA,K, GLUC, HGB,HCT)
Glucose, Bld: 88
HCT: 42
Hemoglobin: 14.3
Potassium: 4.3
Sodium: 141

## 2011-07-02 LAB — POCT CARDIAC MARKERS
CKMB, poc: 1.3
Myoglobin, poc: >500
Troponin i, poc: <0.05

## 2011-07-02 LAB — APTT: aPTT: 34

## 2011-07-02 LAB — PROTIME-INR
INR: 1
Prothrombin Time: 13.5

## 2011-07-02 LAB — POTASSIUM: Potassium: 3.6

## 2011-07-04 ENCOUNTER — Other Ambulatory Visit: Payer: Self-pay | Admitting: Neurosurgery

## 2011-07-04 DIAGNOSIS — M5410 Radiculopathy, site unspecified: Secondary | ICD-10-CM

## 2011-07-04 DIAGNOSIS — M5020 Other cervical disc displacement, unspecified cervical region: Secondary | ICD-10-CM

## 2011-07-11 ENCOUNTER — Other Ambulatory Visit: Payer: Self-pay

## 2011-07-11 LAB — POCT I-STAT 4, (NA,K, GLUC, HGB,HCT)
Glucose, Bld: 95
HCT: 50
Hemoglobin: 17
Operator id: 274481
Potassium: 5.3 — ABNORMAL HIGH
Sodium: 134 — ABNORMAL LOW

## 2011-07-16 LAB — POCT I-STAT 4, (NA,K, GLUC, HGB,HCT)
Glucose, Bld: 98
HCT: 48
Hemoglobin: 16.3
Operator id: 181601
Potassium: 4.5
Sodium: 135

## 2011-07-18 LAB — POCT I-STAT 4, (NA,K, GLUC, HGB,HCT)
Glucose, Bld: 88
Glucose, Bld: 90
HCT: 38 — ABNORMAL LOW
HCT: 47
Hemoglobin: 12.9 — ABNORMAL LOW
Hemoglobin: 16
Operator id: 181601
Operator id: 274481
Potassium: 4.2
Potassium: 4.6
Sodium: 133 — ABNORMAL LOW
Sodium: 134 — ABNORMAL LOW

## 2011-07-19 LAB — BASIC METABOLIC PANEL
BUN: 49 — ABNORMAL HIGH
CO2: 31
Calcium: 9.9
Chloride: 95 — ABNORMAL LOW
Creatinine, Ser: 11.16 — ABNORMAL HIGH
GFR calc Af Amer: 6 — ABNORMAL LOW
GFR calc non Af Amer: 5 — ABNORMAL LOW
Glucose, Bld: 84
Potassium: 5.1
Sodium: 139

## 2011-07-19 LAB — CBC
HCT: 36.3 — ABNORMAL LOW
Hemoglobin: 11.7 — ABNORMAL LOW
MCHC: 32.2
MCV: 90.6
Platelets: 278
RBC: 4.01 — ABNORMAL LOW
RDW: 16.6 — ABNORMAL HIGH
WBC: 8.2

## 2011-10-15 DIAGNOSIS — Z79899 Other long term (current) drug therapy: Secondary | ICD-10-CM | POA: Diagnosis not present

## 2011-10-15 DIAGNOSIS — D649 Anemia, unspecified: Secondary | ICD-10-CM | POA: Diagnosis not present

## 2011-10-15 DIAGNOSIS — B351 Tinea unguium: Secondary | ICD-10-CM | POA: Diagnosis not present

## 2011-10-15 DIAGNOSIS — Z940 Kidney transplant status: Secondary | ICD-10-CM | POA: Diagnosis not present

## 2011-10-15 DIAGNOSIS — M79609 Pain in unspecified limb: Secondary | ICD-10-CM | POA: Diagnosis not present

## 2011-10-29 DIAGNOSIS — D649 Anemia, unspecified: Secondary | ICD-10-CM | POA: Diagnosis not present

## 2011-10-29 DIAGNOSIS — G4700 Insomnia, unspecified: Secondary | ICD-10-CM | POA: Diagnosis not present

## 2011-10-29 DIAGNOSIS — E109 Type 1 diabetes mellitus without complications: Secondary | ICD-10-CM | POA: Diagnosis not present

## 2011-10-29 DIAGNOSIS — Z940 Kidney transplant status: Secondary | ICD-10-CM | POA: Diagnosis not present

## 2011-10-29 DIAGNOSIS — M79609 Pain in unspecified limb: Secondary | ICD-10-CM | POA: Diagnosis not present

## 2011-10-29 DIAGNOSIS — Z79899 Other long term (current) drug therapy: Secondary | ICD-10-CM | POA: Diagnosis not present

## 2011-11-13 DIAGNOSIS — Z79899 Other long term (current) drug therapy: Secondary | ICD-10-CM | POA: Diagnosis not present

## 2011-11-13 DIAGNOSIS — Z940 Kidney transplant status: Secondary | ICD-10-CM | POA: Diagnosis not present

## 2011-11-13 DIAGNOSIS — D649 Anemia, unspecified: Secondary | ICD-10-CM | POA: Diagnosis not present

## 2011-11-26 DIAGNOSIS — Z940 Kidney transplant status: Secondary | ICD-10-CM | POA: Diagnosis not present

## 2011-11-26 DIAGNOSIS — N183 Chronic kidney disease, stage 3 unspecified: Secondary | ICD-10-CM | POA: Diagnosis not present

## 2011-11-26 DIAGNOSIS — N2581 Secondary hyperparathyroidism of renal origin: Secondary | ICD-10-CM | POA: Diagnosis not present

## 2011-11-26 DIAGNOSIS — D649 Anemia, unspecified: Secondary | ICD-10-CM | POA: Diagnosis not present

## 2011-11-26 DIAGNOSIS — Z79899 Other long term (current) drug therapy: Secondary | ICD-10-CM | POA: Diagnosis not present

## 2011-11-26 DIAGNOSIS — E785 Hyperlipidemia, unspecified: Secondary | ICD-10-CM | POA: Diagnosis not present

## 2011-11-29 DIAGNOSIS — F17200 Nicotine dependence, unspecified, uncomplicated: Secondary | ICD-10-CM | POA: Diagnosis not present

## 2011-11-29 DIAGNOSIS — R0989 Other specified symptoms and signs involving the circulatory and respiratory systems: Secondary | ICD-10-CM | POA: Diagnosis not present

## 2011-11-29 DIAGNOSIS — R5381 Other malaise: Secondary | ICD-10-CM | POA: Diagnosis not present

## 2011-11-29 DIAGNOSIS — R0609 Other forms of dyspnea: Secondary | ICD-10-CM | POA: Diagnosis not present

## 2011-12-10 DIAGNOSIS — R5383 Other fatigue: Secondary | ICD-10-CM | POA: Diagnosis not present

## 2011-12-10 DIAGNOSIS — E559 Vitamin D deficiency, unspecified: Secondary | ICD-10-CM | POA: Diagnosis not present

## 2011-12-10 DIAGNOSIS — R5381 Other malaise: Secondary | ICD-10-CM | POA: Diagnosis not present

## 2011-12-10 DIAGNOSIS — D649 Anemia, unspecified: Secondary | ICD-10-CM | POA: Diagnosis not present

## 2011-12-10 DIAGNOSIS — Z79899 Other long term (current) drug therapy: Secondary | ICD-10-CM | POA: Diagnosis not present

## 2011-12-10 DIAGNOSIS — Z940 Kidney transplant status: Secondary | ICD-10-CM | POA: Diagnosis not present

## 2011-12-24 DIAGNOSIS — Z940 Kidney transplant status: Secondary | ICD-10-CM | POA: Diagnosis not present

## 2011-12-24 DIAGNOSIS — Z79899 Other long term (current) drug therapy: Secondary | ICD-10-CM | POA: Diagnosis not present

## 2011-12-24 DIAGNOSIS — D649 Anemia, unspecified: Secondary | ICD-10-CM | POA: Diagnosis not present

## 2012-01-01 DIAGNOSIS — R0989 Other specified symptoms and signs involving the circulatory and respiratory systems: Secondary | ICD-10-CM | POA: Diagnosis not present

## 2012-01-01 DIAGNOSIS — R0609 Other forms of dyspnea: Secondary | ICD-10-CM | POA: Diagnosis not present

## 2012-01-03 DIAGNOSIS — E109 Type 1 diabetes mellitus without complications: Secondary | ICD-10-CM | POA: Diagnosis not present

## 2012-01-03 DIAGNOSIS — G4700 Insomnia, unspecified: Secondary | ICD-10-CM | POA: Diagnosis not present

## 2012-01-09 DIAGNOSIS — R0989 Other specified symptoms and signs involving the circulatory and respiratory systems: Secondary | ICD-10-CM | POA: Diagnosis not present

## 2012-01-09 DIAGNOSIS — R0609 Other forms of dyspnea: Secondary | ICD-10-CM | POA: Diagnosis not present

## 2012-01-09 DIAGNOSIS — F17200 Nicotine dependence, unspecified, uncomplicated: Secondary | ICD-10-CM | POA: Diagnosis not present

## 2012-01-09 DIAGNOSIS — R5383 Other fatigue: Secondary | ICD-10-CM | POA: Diagnosis not present

## 2012-01-09 DIAGNOSIS — R5381 Other malaise: Secondary | ICD-10-CM | POA: Diagnosis not present

## 2012-01-14 DIAGNOSIS — Z940 Kidney transplant status: Secondary | ICD-10-CM | POA: Diagnosis not present

## 2012-01-14 DIAGNOSIS — D649 Anemia, unspecified: Secondary | ICD-10-CM | POA: Diagnosis not present

## 2012-01-14 DIAGNOSIS — Z79899 Other long term (current) drug therapy: Secondary | ICD-10-CM | POA: Diagnosis not present

## 2012-01-28 DIAGNOSIS — D649 Anemia, unspecified: Secondary | ICD-10-CM | POA: Diagnosis not present

## 2012-01-28 DIAGNOSIS — Z79899 Other long term (current) drug therapy: Secondary | ICD-10-CM | POA: Diagnosis not present

## 2012-01-28 DIAGNOSIS — Z940 Kidney transplant status: Secondary | ICD-10-CM | POA: Diagnosis not present

## 2012-02-08 DIAGNOSIS — R0609 Other forms of dyspnea: Secondary | ICD-10-CM | POA: Diagnosis not present

## 2012-02-08 DIAGNOSIS — R0989 Other specified symptoms and signs involving the circulatory and respiratory systems: Secondary | ICD-10-CM | POA: Diagnosis not present

## 2012-02-08 DIAGNOSIS — F17200 Nicotine dependence, unspecified, uncomplicated: Secondary | ICD-10-CM | POA: Diagnosis not present

## 2012-02-08 DIAGNOSIS — R5381 Other malaise: Secondary | ICD-10-CM | POA: Diagnosis not present

## 2012-02-11 DIAGNOSIS — E559 Vitamin D deficiency, unspecified: Secondary | ICD-10-CM | POA: Diagnosis not present

## 2012-02-12 DIAGNOSIS — N2581 Secondary hyperparathyroidism of renal origin: Secondary | ICD-10-CM | POA: Diagnosis not present

## 2012-02-12 DIAGNOSIS — Z940 Kidney transplant status: Secondary | ICD-10-CM | POA: Diagnosis not present

## 2012-02-12 DIAGNOSIS — R5381 Other malaise: Secondary | ICD-10-CM | POA: Diagnosis not present

## 2012-02-12 DIAGNOSIS — R5383 Other fatigue: Secondary | ICD-10-CM | POA: Diagnosis not present

## 2012-02-12 DIAGNOSIS — D649 Anemia, unspecified: Secondary | ICD-10-CM | POA: Diagnosis not present

## 2012-02-12 DIAGNOSIS — N185 Chronic kidney disease, stage 5: Secondary | ICD-10-CM | POA: Diagnosis not present

## 2012-02-12 DIAGNOSIS — Z125 Encounter for screening for malignant neoplasm of prostate: Secondary | ICD-10-CM | POA: Diagnosis not present

## 2012-02-12 DIAGNOSIS — R634 Abnormal weight loss: Secondary | ICD-10-CM | POA: Diagnosis not present

## 2012-02-12 DIAGNOSIS — N183 Chronic kidney disease, stage 3 unspecified: Secondary | ICD-10-CM | POA: Diagnosis not present

## 2012-02-18 DIAGNOSIS — M79609 Pain in unspecified limb: Secondary | ICD-10-CM | POA: Diagnosis not present

## 2012-02-18 DIAGNOSIS — B351 Tinea unguium: Secondary | ICD-10-CM | POA: Diagnosis not present

## 2012-02-19 DIAGNOSIS — G4700 Insomnia, unspecified: Secondary | ICD-10-CM | POA: Diagnosis not present

## 2012-02-19 DIAGNOSIS — E119 Type 2 diabetes mellitus without complications: Secondary | ICD-10-CM | POA: Diagnosis not present

## 2012-02-19 DIAGNOSIS — G8929 Other chronic pain: Secondary | ICD-10-CM | POA: Diagnosis not present

## 2012-02-21 ENCOUNTER — Encounter (HOSPITAL_COMMUNITY): Payer: Self-pay | Admitting: Internal Medicine

## 2012-02-26 DIAGNOSIS — Z940 Kidney transplant status: Secondary | ICD-10-CM | POA: Diagnosis not present

## 2012-02-26 DIAGNOSIS — D649 Anemia, unspecified: Secondary | ICD-10-CM | POA: Diagnosis not present

## 2012-02-26 DIAGNOSIS — Z79899 Other long term (current) drug therapy: Secondary | ICD-10-CM | POA: Diagnosis not present

## 2012-03-02 ENCOUNTER — Encounter (HOSPITAL_COMMUNITY): Payer: Self-pay

## 2012-03-02 ENCOUNTER — Emergency Department (HOSPITAL_COMMUNITY)
Admission: EM | Admit: 2012-03-02 | Discharge: 2012-03-02 | Disposition: A | Discharge status: Home / Self Care | Payer: Medicare Other | Attending: Emergency Medicine | Admitting: Emergency Medicine

## 2012-03-02 DIAGNOSIS — R6889 Other general symptoms and signs: Secondary | ICD-10-CM | POA: Diagnosis not present

## 2012-03-02 DIAGNOSIS — Z7982 Long term (current) use of aspirin: Secondary | ICD-10-CM | POA: Insufficient documentation

## 2012-03-02 DIAGNOSIS — I10 Essential (primary) hypertension: Secondary | ICD-10-CM | POA: Diagnosis not present

## 2012-03-02 DIAGNOSIS — Z940 Kidney transplant status: Secondary | ICD-10-CM | POA: Insufficient documentation

## 2012-03-02 DIAGNOSIS — M62838 Other muscle spasm: Secondary | ICD-10-CM | POA: Insufficient documentation

## 2012-03-02 DIAGNOSIS — F17200 Nicotine dependence, unspecified, uncomplicated: Secondary | ICD-10-CM | POA: Insufficient documentation

## 2012-03-02 DIAGNOSIS — M5380 Other specified dorsopathies, site unspecified: Secondary | ICD-10-CM | POA: Diagnosis not present

## 2012-03-02 DIAGNOSIS — M545 Low back pain, unspecified: Secondary | ICD-10-CM | POA: Insufficient documentation

## 2012-03-02 DIAGNOSIS — N289 Disorder of kidney and ureter, unspecified: Secondary | ICD-10-CM | POA: Diagnosis not present

## 2012-03-02 DIAGNOSIS — E785 Hyperlipidemia, unspecified: Secondary | ICD-10-CM | POA: Insufficient documentation

## 2012-03-02 DIAGNOSIS — Z79899 Other long term (current) drug therapy: Secondary | ICD-10-CM | POA: Insufficient documentation

## 2012-03-02 MED ORDER — METHOCARBAMOL 750 MG PO TABS: 750.0000 mg | ORAL_TABLET | Freq: Four times a day (QID) | ORAL | Status: DC

## 2012-03-02 MED ORDER — IBUPROFEN 600 MG PO TABS: 600.0000 mg | ORAL_TABLET | Freq: Three times a day (TID) | ORAL | Status: DC | PRN

## 2012-03-02 MED ORDER — METHOCARBAMOL 500 MG PO TABS
1000.0000 mg | ORAL_TABLET | Freq: Once | ORAL | Status: AC
  Administered 2012-03-02: 1000 mg via ORAL
  Filled 2012-03-02: qty 2

## 2012-03-02 MED ORDER — METHOCARBAMOL 750 MG PO TABS: 750.0000 mg | ORAL_TABLET | Freq: Four times a day (QID) | ORAL | Status: AC

## 2012-03-02 NOTE — Discharge Instructions (Signed)
Read the information below.  Please call your primary care provider tomorrow morning to schedule a close follow up appointment.   Please discuss all medications with your pharmacist. If you develop uncontrolled pain, fever, loss of control of bowel or bladder, You may return to the ER at any time for worsening condition or any new symptoms that concern you.   Back Pain, Adult Low back pain is very common. About 1 in 5 people have back pain.The cause of low back pain is rarely dangerous. The pain often gets better over time.About half of people with a sudden onset of back pain feel better in just 2 weeks. About 8 in 10 people feel better by 6 weeks.  CAUSES Some common causes of back pain include:  Strain of the muscles or ligaments supporting the spine.   Wear and tear (degeneration) of the spinal discs.   Arthritis.   Direct injury to the back.  DIAGNOSIS Most of the time, the direct cause of low back pain is not known.However, back pain can be treated effectively even when the exact cause of the pain is unknown.Answering your caregiver's questions about your overall health and symptoms is one of the most accurate ways to make sure the cause of your pain is not dangerous. If your caregiver needs more information, he or she may order lab work or imaging tests (X-rays or MRIs).However, even if imaging tests show changes in your back, this usually does not require surgery. HOME CARE INSTRUCTIONS For many people, back pain returns.Since low back pain is rarely dangerous, it is often a condition that people can learn to [facility] their own.   Remain active. It is stressful on the back to sit or stand in one place. Do not sit, drive, or stand in one place for more than 30 minutes at a time. Take short walks on level surfaces as soon as pain allows.Try to increase the length of time you walk each day.   Do not stay in bed.Resting more than 1 or 2 days can delay your recovery.   Do not avoid  exercise or work.Your body is made to move.It is not dangerous to be active, even though your back may hurt.Your back will likely heal faster if you return to being active before your pain is gone.   Pay attention to your body when you bend and lift. Many people have less discomfortwhen lifting if they bend their knees, keep the load close to their bodies,and avoid twisting. Often, the most comfortable positions are those that put less stress on your recovering back.   Find a comfortable position to sleep. Use a firm mattress and lie on your side with your knees slightly bent. If you lie on your back, put a pillow under your knees.   Only take over-the-counter or prescription medicines as directed by your caregiver. Over-the-counter medicines to reduce pain and inflammation are often the most helpful.Your caregiver may prescribe muscle relaxant drugs.These medicines help dull your pain so you can more quickly return to your normal activities and healthy exercise.   Put ice on the injured area.   Put ice in a plastic bag.   Place a towel between your skin and the bag.   Leave the ice on for 15 to 20 minutes, 3 to 4 times a day for the first 2 to 3 days. After that, ice and heat may be alternated to reduce pain and spasms.   Ask your caregiver about trying back exercises and gentle  massage. This may be of some benefit.   Avoid feeling anxious or stressed.Stress increases muscle tension and can worsen back pain.It is important to recognize when you are anxious or stressed and learn ways to manage it.Exercise is a great option.  SEEK MEDICAL CARE IF:  You have pain that is not relieved with rest or medicine.   You have pain that does not improve in 1 week.   You have new symptoms.   You are generally not feeling well.  SEEK IMMEDIATE MEDICAL CARE IF:   You have pain that radiates from your back into your legs.   You develop new bowel or bladder control problems.   You have  unusual weakness or numbness in your arms or legs.   You develop nausea or vomiting.   You develop abdominal pain.   You feel faint.  Document Released: 09/17/2005 Document Revised: 09/06/2011 Document Reviewed: 02/05/2011 [facility] Patient Information 2012 [place].

## 2012-03-02 NOTE — ED Notes (Signed)
Pt here for back pain for 2 days worse today, pt sts no trauma, had no relief with lortab. Arrived by ems due to inability to ambulate at home

## 2012-03-02 NOTE — ED Provider Notes (Signed)
History     CSN: [id number]  Arrival date & time 03/02/12  1511   First MD Initiated Contact with Patient 03/02/12 1657      Chief Complaint  Patient presents with  . Back Pain    (Consider location/radiation/quality/duration/timing/severity/associated sxs/prior treatment) HPI Comments: Patient reports constant right sided low back pain x 2 days that is aching and throbbing in nature, worse with moving, walking, getting up to standing position from sitting.  Has some mild radiation into right hip. Denies any injury to the area, falls, fever, abdominal pain, urinary symptoms, loss of control of bowel or bladder, saddle anesthesia, weakness or numbness of the legs. Is taking home vicodin without improvement.    Patient is a 51 y.o. male presenting with back pain. The history is provided by the patient.  Back Pain  Pertinent negatives include no fever, no abdominal pain and no dysuria.    Past Medical History  Diagnosis Date  . Hypertension   . Hyperlipidemia   . Kidney disease     s/p kidney transplant 2011    Past Surgical History  Procedure Date  . Kidney transplant 2011    Family History  Problem Relation Age of Onset  . Hypertension Father   . Diabetes Mother     History  Substance Use Topics  . Smoking status: Current Everyday Smoker -- 1.0 packs/day  . Smokeless tobacco: Not on file  . Alcohol Use: No      Review of Systems  Constitutional: Negative for fever and chills.  Gastrointestinal: Negative for nausea, vomiting, abdominal pain, diarrhea and constipation.  Genitourinary: Negative for dysuria, urgency, frequency and hematuria.  Musculoskeletal: Positive for back pain.    Allergies  Tomato  Home Medications   Current Outpatient Rx  Name Route Sig Dispense Refill  . ALPRAZOLAM 1 MG PO TABS Oral Take 1 mg by mouth daily as needed. For anxiety.    . AMLODIPINE BESYLATE 10 MG PO TABS Oral Take 10 mg by mouth every evening.     . ASPIRIN 81 MG PO  TABS Oral Take 81 mg by mouth daily.    . BUDESONIDE 180 MCG/ACT IN AEPB Inhalation Inhale 2 puffs into the lungs 2 (two) times daily.    Marland Kitchen CARVEDILOL 25 MG PO TABS Oral Take 25 mg by mouth 2 (two) times daily with a meal.    . COLCHICINE 0.6 MG PO TABS Oral Take 0.6 mg by mouth daily.    Marland Kitchen GABAPENTIN 100 MG PO CAPS Oral Take 100 mg by mouth 2 (two) times daily.    Marland Kitchen HYDROCODONE-ACETAMINOPHEN 10-660 MG PO TABS Oral Take 1 tablet by mouth daily.    Marland Kitchen MAGNESIUM OXIDE 400 MG PO TABS Oral Take 400 mg by mouth every evening.     Marland Kitchen MYCOPHENOLATE SODIUM 360 MG PO TBEC Oral Take 720 mg by mouth 2 (two) times daily.    Marland Kitchen OMEPRAZOLE 20 MG PO CPDR Oral Take 20 mg by mouth daily.    Marland Kitchen PIOGLITAZONE HCL 15 MG PO TABS Oral Take 15 mg by mouth daily.    Marland Kitchen PREDNISONE 5 MG PO TABS Oral Take 5 mg by mouth every morning.     Marland Kitchen SIMVASTATIN 40 MG PO TABS Oral Take 40 mg by mouth every Monday, Wednesday, and Friday.     . SODIUM BICARBONATE 650 MG PO TABS Oral Take 1,300 mg by mouth 2 (two) times daily.    Marland Kitchen TACROLIMUS 1 MG PO CAPS Oral Take 3-4 mg  by mouth 2 (two) times daily. Take 4 mg every AM and 3 mg every PM    . VITAMIN E 400 UNITS PO CAPS Oral Take 800 Units by mouth daily.      BP 107/68  Pulse 71  Temp(Src) 99.2 F (37.3 C) (Oral)  Resp 15  SpO2 96%  Physical Exam  Nursing note and vitals reviewed. Constitutional: He is oriented to person, place, and time. He appears well-developed and well-nourished.  HENT:  Head: Normocephalic and atraumatic.  Neck: Neck supple.  Cardiovascular: Normal rate and regular rhythm.   Pulmonary/Chest: Effort normal and breath sounds normal.  Musculoskeletal:       Lumbar back: He exhibits tenderness, swelling, pain and spasm. He exhibits normal range of motion, no bony tenderness, no deformity, no laceration and normal pulse.       Arms:      Entire spine without bony tenderness, no crepitus or stepoffs.  Lower extremities: strength 5/5, sensation intact, distal  pulses intact.   Neurological: He is alert and oriented to person, place, and time.       Patient ambulates well, but as if he has back pain.     *patient's small area of erythema over right back, not connected to area of tenderness or spasm.  This area is not tender, no warmth, no edema.  I suspect this is due to his suspender buckle pressing on his skin as he leans back against chair.   ED Course  Procedures (including critical care time)  Labs Reviewed - No data to display No results found.   5:49 PM Family member requests that I check his kidney as patient had a kidney transplant two years ago.  Patient declines, stating he gets his kidney function checked every two weeks, it has been fine, and he has an appointment soon.    Pt is ambulatory.    1. Lower back pain   2. Muscle spasm       MDM  Patient with two days of constant right lower back pain.  On exam, muscle is in spasm.  Pain is worse with movement, palpation.  No red flags for back pain.  Pt given robaxin, d/c home with PCP follow up, discussed return precautions.  Patient verbalizes understanding and agrees with plan.          [place], [place] 03/02/12 2131

## 2012-03-03 NOTE — ED Provider Notes (Signed)
Medical screening examination/treatment/procedure(s) were performed by non-physician practitioner and as supervising physician I was immediately available for consultation/collaboration.   Lezlie Octave, MD 03/03/12 1505

## 2012-03-04 DIAGNOSIS — IMO0002 Reserved for concepts with insufficient information to code with codable children: Secondary | ICD-10-CM | POA: Diagnosis not present

## 2012-03-04 DIAGNOSIS — G8929 Other chronic pain: Secondary | ICD-10-CM | POA: Diagnosis not present

## 2012-03-04 DIAGNOSIS — E119 Type 2 diabetes mellitus without complications: Secondary | ICD-10-CM | POA: Diagnosis not present

## 2012-03-04 DIAGNOSIS — G4700 Insomnia, unspecified: Secondary | ICD-10-CM | POA: Diagnosis not present

## 2012-03-05 ENCOUNTER — Telehealth (HOSPITAL_COMMUNITY): Payer: Self-pay | Admitting: *Deleted

## 2012-03-05 NOTE — Telephone Encounter (Signed)
Per Dr Maxie Barb office pt needs RHC, scheduled  For 6/11 at 10 am, pt is aware to arrive at [facility] at 8 am

## 2012-03-06 ENCOUNTER — Encounter (HOSPITAL_COMMUNITY): Payer: Self-pay | Admitting: Pharmacy Technician

## 2012-03-10 ENCOUNTER — Other Ambulatory Visit (HOSPITAL_COMMUNITY): Payer: Self-pay | Admitting: Adult Health

## 2012-03-10 DIAGNOSIS — R0600 Dyspnea, unspecified: Secondary | ICD-10-CM

## 2012-03-11 ENCOUNTER — Encounter (HOSPITAL_COMMUNITY): Payer: Self-pay | Admitting: Physician Assistant

## 2012-03-11 ENCOUNTER — Ambulatory Visit (HOSPITAL_COMMUNITY)
Admission: RE | Admit: 2012-03-11 | Discharge: 2012-03-11 | Disposition: A | Discharge status: Home / Self Care | Payer: Medicare Other | Source: Ambulatory Visit | Attending: Internal Medicine | Admitting: Internal Medicine

## 2012-03-11 ENCOUNTER — Encounter (HOSPITAL_COMMUNITY)
Admission: RE | Disposition: A | Discharge status: Home / Self Care | Payer: Self-pay | Source: Ambulatory Visit | Attending: Internal Medicine

## 2012-03-11 DIAGNOSIS — R0600 Dyspnea, unspecified: Secondary | ICD-10-CM

## 2012-03-11 DIAGNOSIS — I120 Hypertensive chronic kidney disease with stage 5 chronic kidney disease or end stage renal disease: Secondary | ICD-10-CM | POA: Diagnosis not present

## 2012-03-11 DIAGNOSIS — R0609 Other forms of dyspnea: Secondary | ICD-10-CM | POA: Diagnosis not present

## 2012-03-11 DIAGNOSIS — E785 Hyperlipidemia, unspecified: Secondary | ICD-10-CM | POA: Diagnosis not present

## 2012-03-11 DIAGNOSIS — R0989 Other specified symptoms and signs involving the circulatory and respiratory systems: Secondary | ICD-10-CM | POA: Diagnosis not present

## 2012-03-11 DIAGNOSIS — I2510 Atherosclerotic heart disease of native coronary artery without angina pectoris: Secondary | ICD-10-CM | POA: Insufficient documentation

## 2012-03-11 DIAGNOSIS — Z940 Kidney transplant status: Secondary | ICD-10-CM | POA: Insufficient documentation

## 2012-03-11 DIAGNOSIS — I2582 Chronic total occlusion of coronary artery: Secondary | ICD-10-CM | POA: Insufficient documentation

## 2012-03-11 DIAGNOSIS — N186 End stage renal disease: Secondary | ICD-10-CM | POA: Diagnosis not present

## 2012-03-11 HISTORY — PX: LEFT HEART CATHETERIZATION WITH CORONARY ANGIOGRAM: SHX5451

## 2012-03-11 HISTORY — PX: RIGHT HEART CATHETERIZATION: SHX5447

## 2012-03-11 LAB — POCT I-STAT 3, VENOUS BLOOD GAS (G3P V)
Acid-base deficit: 2 mmol/L (ref 0.0–2.0)
Acid-base deficit: 2 mmol/L (ref 0.0–2.0)
Bicarbonate: 23.1 mEq/L (ref 20.0–24.0)
Bicarbonate: 23.2 mEq/L (ref 20.0–24.0)
O2 Saturation: 71 %
O2 Saturation: 72 %
TCO2: 24 mmol/L (ref 0–100)
TCO2: 24 mmol/L (ref 0–100)
pCO2, Ven: 39.9 mmHg — ABNORMAL LOW (ref 45.0–50.0)
pCO2, Ven: 40.7 mmHg — ABNORMAL LOW (ref 45.0–50.0)
pH, Ven: 7.362 — ABNORMAL HIGH (ref 7.250–7.300)
pH, Ven: 7.372 — ABNORMAL HIGH (ref 7.250–7.300)
pO2, Ven: 38 mmHg (ref 30.0–45.0)
pO2, Ven: 40 mmHg (ref 30.0–45.0)

## 2012-03-11 LAB — POCT I-STAT 3, ART BLOOD GAS (G3+)
Acid-base deficit: 5 mmol/L — ABNORMAL HIGH (ref 0.0–2.0)
Bicarbonate: 20.3 mEq/L (ref 20.0–24.0)
O2 Saturation: 95 %
TCO2: 21 mmol/L (ref 0–100)
pCO2 arterial: 36.3 mmHg (ref 35.0–45.0)
pH, Arterial: 7.355 (ref 7.350–7.450)
pO2, Arterial: 78 mmHg — ABNORMAL LOW (ref 80.0–100.0)

## 2012-03-11 LAB — CBC
HCT: 41.5 % (ref 39.0–52.0)
Hemoglobin: 13.8 g/dL (ref 13.0–17.0)
MCH: 27.6 pg (ref 26.0–34.0)
MCHC: 33.3 g/dL (ref 30.0–36.0)
MCV: 83 fL (ref 78.0–100.0)
Platelets: 278 10*3/uL (ref 150–400)
RBC: 5 MIL/uL (ref 4.22–5.81)
RDW: 14.8 % (ref 11.5–15.5)
WBC: 3.6 10*3/uL — ABNORMAL LOW (ref 4.0–10.5)

## 2012-03-11 LAB — PROTIME-INR
INR: 1.03 (ref 0.00–1.49)
Prothrombin Time: 13.7 seconds (ref 11.6–15.2)

## 2012-03-11 LAB — BASIC METABOLIC PANEL
BUN: 22 mg/dL (ref 6–23)
CO2: 24 mEq/L (ref 19–32)
Calcium: 9.7 mg/dL (ref 8.4–10.5)
Chloride: 104 mEq/L (ref 96–112)
Creatinine, Ser: 1.32 mg/dL (ref 0.50–1.35)
GFR calc Af Amer: 71 mL/min — ABNORMAL LOW (ref >90)
GFR calc non Af Amer: 61 mL/min — ABNORMAL LOW (ref >90)
Glucose, Bld: 93 mg/dL (ref 70–99)
Potassium: 3.9 mEq/L (ref 3.5–5.1)
Sodium: 141 mEq/L (ref 135–145)

## 2012-03-11 LAB — GLUCOSE, CAPILLARY
Glucose-Capillary: 95 mg/dL (ref 70–99)
Glucose-Capillary: 98 mg/dL (ref 70–99)

## 2012-03-11 SURGERY — RIGHT HEART CATH
Anesthesia: LOCAL

## 2012-03-11 MED ORDER — MIDAZOLAM HCL 2 MG/2ML IJ SOLN
INTRAMUSCULAR | Status: AC
  Filled 2012-03-11: qty 2

## 2012-03-11 MED ORDER — ONDANSETRON HCL 4 MG/2ML IJ SOLN: 4.0000 mg | Freq: Four times a day (QID) | INTRAMUSCULAR | Status: DC | PRN

## 2012-03-11 MED ORDER — ASPIRIN 81 MG PO CHEW
324.0000 mg | CHEWABLE_TABLET | ORAL | Status: AC
  Administered 2012-03-11: 324 mg via ORAL
  Filled 2012-03-11: qty 4

## 2012-03-11 MED ORDER — LIDOCAINE HCL (PF) 1 % IJ SOLN
INTRAMUSCULAR | Status: AC
  Filled 2012-03-11: qty 30

## 2012-03-11 MED ORDER — SODIUM CHLORIDE 0.9 % IV SOLN: 1.0000 mL/kg/h | INTRAVENOUS | Status: AC

## 2012-03-11 MED ORDER — SODIUM CHLORIDE 0.9 % IJ SOLN: 3.0000 mL | Freq: Two times a day (BID) | INTRAMUSCULAR | Status: DC

## 2012-03-11 MED ORDER — SODIUM CHLORIDE 0.9 % IV SOLN: 250.0000 mL | INTRAVENOUS | Status: DC | PRN

## 2012-03-11 MED ORDER — SODIUM CHLORIDE 0.9 % IJ SOLN: 3.0000 mL | INTRAMUSCULAR | Status: DC | PRN

## 2012-03-11 MED ORDER — ACETAMINOPHEN 325 MG PO TABS: 650.0000 mg | ORAL_TABLET | ORAL | Status: DC | PRN

## 2012-03-11 MED ORDER — FENTANYL CITRATE 0.05 MG/ML IJ SOLN
INTRAMUSCULAR | Status: AC
  Filled 2012-03-11: qty 2

## 2012-03-11 MED ORDER — SODIUM CHLORIDE 0.9 % IV SOLN
INTRAVENOUS | Status: DC
  Administered 2012-03-11: 08:00:00 via INTRAVENOUS

## 2012-03-11 MED ORDER — NITROGLYCERIN 0.2 MG/ML ON CALL CATH LAB
INTRAVENOUS | Status: AC
  Filled 2012-03-11: qty 1

## 2012-03-11 MED ORDER — HEPARIN (PORCINE) IN NACL 2-0.9 UNIT/ML-% IJ SOLN
INTRAMUSCULAR | Status: AC
  Filled 2012-03-11: qty 2000

## 2012-03-11 MED ORDER — SODIUM CHLORIDE 0.9 % IV SOLN: 1.0000 mL/kg/h | INTRAVENOUS | Status: DC

## 2012-03-11 NOTE — Discharge Instructions (Signed)

## 2012-03-11 NOTE — H&P (Signed)
History and Physical  Patient ID: Theodore Arellano Patient ID: JORJE Arellano MRN: [id number], DOB/AGE: November 15, 1960 51 y.o. Date of Encounter: 03/11/2012  Primary Physician: Dr. Gardiner Rhyme Primary Cardiologist: DB  Chief Complaint:  SOB  HPI: Mr Lundwall is a 51 year old male with no previous cardiac issues. He has a h/o HTN, HL, ongoing tobacco use and ESRD s/p renal transplant. He has been struggling with ongoing dyspnea. He has been followed closely by Dr. Alcide Clever. PFTs  Do not show significant COPD. Denies CP, orthopnea, PND or LE edema. Gets dyspneic with mild to moderate exertion. Had a stress test prior to his kidney transplant which he says was normal.       Past Medical History  Diagnosis Date  . Hypertension   . Hyperlipidemia   . Kidney disease     s/p kidney transplant 2011     Surgical History:  Past Surgical History  Procedure Date  . Kidney transplant 2011  . Hd access procedures     I have reviewed the patient's current medications. Medication Sig  ALPRAZolam (XANAX) 1 MG tablet Take 1 mg by mouth daily as needed. For anxiety.  amLODipine (NORVASC) 10 MG tablet Take 10 mg by mouth every evening.   aspirin 81 MG tablet Take 81 mg by mouth daily.  budesonide (PULMICORT) 180 MCG/ACT inhaler Inhale 2 puffs into the lungs 2 (two) times daily.  carvedilol (COREG) 25 MG tablet Take 25 mg by mouth 2 (two) times daily with a meal.  colchicine 0.6 MG tablet Take 0.6 mg by mouth daily.  gabapentin (NEURONTIN) 100 MG capsule Take 100 mg by mouth 2 (two) times daily.  Hydrocodone-Acetaminophen (VICODIN HP) 10-660 MG TABS Take 1 tablet by mouth daily.  magnesium oxide (MAG-OX) 400 MG tablet Take 400 mg by mouth every evening.   methocarbamol (ROBAXIN) 750 MG tablet Take 1 tablet (750 mg total) by mouth 4 (four) times daily.  mycophenolate (MYFORTIC) 360 MG TBEC Take 720 mg by mouth 2 (two) times daily.  omeprazole (PRILOSEC) 20 MG capsule Take 20 mg by mouth daily.    pioglitazone (ACTOS) 15 MG tablet Take 15 mg by mouth daily.  predniSONE (DELTASONE) 5 MG tablet Take 5 mg by mouth every morning.   simvastatin (ZOCOR) 40 MG tablet Take 40 mg by mouth every Monday, Wednesday, and Friday.   sodium bicarbonate 650 MG tablet Take 1,300 mg by mouth 2 (two) times daily.  tacrolimus (PROGRAF) 1 MG capsule Take 3-4 mg by mouth 2 (two) times daily. Take 4 mg every AM and 3 mg every PM  vitamin E 400 UNIT capsule Take 800 Units by mouth daily.    Scheduled Meds:   . aspirin  324 mg Oral Pre-Cath  . sodium chloride  3 mL Intravenous Q12H   Continuous Infusions:   . sodium chloride 20 mL/hr at 03/11/12 0826   PRN Meds:.sodium chloride, sodium chloride  Allergies:  Allergies  Allergen Reactions  . Tomato Hives    History   Social History  . Marital Status: Single    Spouse Name: N/A    Number of Children: N/A  . Years of Education: N/A   Occupational History  . unemployed   Social History Main Topics  . Smoking status: Current Everyday Smoker -- 1.0 packs/day  . Smokeless tobacco: Not on file  . Alcohol Use: No  . Drug Use: No  . Sexually Active: Not on file   Other Topics Concern  . Not on  file   Social History Narrative  . Lives in [place], family nearby     Family History  Problem Relation Age of Onset  . Hypertension Father   . Diabetes Mother     Review of Systems:   Full 14-point review of systems otherwise negative except as noted above.   Physical Exam: Blood pressure 137/83, pulse 57, temperature 97.4 F (36.3 C), temperature source Oral, resp. rate 16, height 5\' 8"  (1.727 m), weight 177 lb (80.287 kg), SpO2 99.00%. General: Well developed, well nourished, male in no acute distress. Head: Normocephalic, atraumatic, sclera non-icteric, no xanthomas, nares are without discharge. Dentition: good Neck: No carotid bruits. JVD not elevated. No thyromegally Lungs: Good expansion bilaterally. without wheezes or rhonchi. Few  dry rales Heart: Regular rate and rhythm with S1 S2.  No S3 or S4.  No murmur, no rubs, or gallops appreciated. Abdomen: Soft, non-tender, non-distended with normoactive bowel sounds. No hepatomegaly. No rebound/guarding. No obvious abdominal masses. Msk:  Strength and tone appear normal weak for age. No joint deformities or effusions, no spine or costo-vertebral angle tenderness. Extremities: No clubbing or cyanosis. No edema.  Distal pedal pulses are 2+ in 4 extrem Neuro: Alert and oriented X 3. Moves all extremities spontaneously. No focal deficits noted. Psych:  Responds to questions appropriately with a normal affect. Skin: No rashes or lesions noted  Labs:   Lab Results  Component Value Date   WBC 3.6* 03/11/2012   HGB 13.8 03/11/2012   HCT 41.5 03/11/2012   MCV 83.0 03/11/2012   PLT 278 03/11/2012    Basename 03/11/12 0812  INR 1.03   Sodium  Date/Time Value Range Status  04/10/2010 11:10 PM 142  135-145 (mEq/L) Final     Potassium  Date/Time Value Range Status  04/10/2010 11:10 PM 4.9  3.5-5.1 (mEq/L) Final     Chloride  Date/Time Value Range Status  04/10/2010 11:10 PM 104  96-112 (mEq/L) Final     CO2  Date/Time Value Range Status  04/10/2010 11:10 PM 26  19-32 (mEq/L) Final     BUN  Date/Time Value Range Status  04/10/2010 11:10 PM 91* 6-23 (mg/dL) Final     Creatinine, Ser  Date/Time Value Range Status  04/10/2010 11:10 PM 18.82* 0.4-1.5 (mg/dL) Final   ECG: 11-Mar-2012 08:03:52 [facility] ROUTINE RECORD Sinus rhythm with 1st degree A-V block with Premature atrial complexes Nonspecific T wave abnormality Abnormal ECG 57mm/s 12mm/mV 100Hz  8.0.1 12SL 241 HD CID: 1 Referred by: Shjon Lizarraga Unconfirmed Vent. rate 62 BPM PR interval 226 ms QRS duration 102 ms QT/QTc 408/414 ms P-R-T axes 69 34   Rhonda Barrett PA-C 03/11/2012, 9:23 AM  Patient seen and examined with Rosaria Ferries, PA-C. We discussed all aspects of the encounter.  I agree with the assessment and plan as stated above. See my A/P below.  ASSESSMENT 1. Ongoing dyspnea - unclear etiology 2. ESRD s/p renal transplant 3. HTN 4. HL 5. Tobacco use   PLAN:   Agree with plan for RHC to further evaluate dyspnea and volume status. May need f/u stress test at some point if sx persist. Needs to stop smoking.   Geralene Afshar,MD 11:24 AM

## 2012-03-11 NOTE — Interval H&P Note (Signed)
History and Physical Interval Note:  03/11/2012 11:25 AM  Theodore Arellano  has presented today for surgery, with the diagnosis of Shortness of breath  The various methods of treatment have been discussed with the patient and family. After consideration of risks, benefits and other options for treatment, the patient has consented to  Procedure(s) (LRB): RIGHT HEART CATH (N/A) as a surgical intervention .  The patients' history has been reviewed, patient examined, no change in status, stable for surgery.  I have reviewed the patients' chart and labs.  Questions were answered to the patient's satisfaction.     [name]

## 2012-03-11 NOTE — CV Procedure (Signed)
Cardiac Cath Procedure Note  Indication: Exertional dyspnea.   Theodore Arellano is a 51 yo smoker with h/o HTN, HL and ESRD s/p successful kidney transplant in 2011. Referred for RHC for progressive dyspnea.    Procedures performed:  1) Right heart cathererization 2) Selective coronary angiography 3) Left heart catheterization 4) Left ventriculogram  Description of procedure:     The risks and indication of the procedure were explained. Consent was signed and placed on the chart. An appropriate timeout was taken prior to the procedure. The left groin was prepped and draped in the routine sterile fashion and anesthetized with 1% local lidocaine.  A 7FR venous venous sheath was placed in the left femoral vein using a modified Seldinger technique. A standard Swan-Ganz catheter was used for the procedure. We initially had planned to do only a RHC but as right sided pressures were normal and flouroscopy showed severe coronary artery calcifications, I discussed with Theodore Arellano the possibility of coronary angiography and LHC. We discussed the risks and indications of this and he elected top proceed.  A 5 FR arterial sheath was placed in the left femoral artery using a modified Seldinger technique. Standard catheters including a JL4, JR4 and angled pigtail were used. All catheter exchanges were made over a wire.   Total contrast use 0000000  Complications:  None apparent  Findings:  RA = 6 RV = 36/4/8 PA = 36/15 (24) PCW = 9 Fick cardiac output/index = 5.7/2.9 PVR = FA sat = 95% PA sat = 71%, 72%  Ao Pressure: 140/77 (103) LV Pressure:  141/7/17 There was no signficant gradient across the aortic valve on pullback.  Left main: Severely calcified. 20% ostial stenosis.  LAD: Heavily calcified through proximal and midsection. 50-60% proximal stenosis. With diffuse 60-70% through midsection. Mild stenosis distally with good target. Small diagonal with heavy diffuse disease  LCX: Large vessel.  Calcified. Large branching ramus with probable 80-90% ostial/proximal stenosis. OM-1 large branching vessel. Upper branch of OM-1 totally occluded. Lower branch of OM-1 with 80-90% stenosis. OM-2 severe distal disease  RCA: Totally occluded proximally. With prominent L->R colalterals  LV-gram done in the RAO projection by hand injection: Not well opacified but EF appears normal with severe LVH.   Assessment: 1. Severe 3-v calcific CAD 2. Normal LV function 3. Relatively normal R heart pressures.    Plan/Discussion:  Very difficult situation. He has severe CAD which will likely progress with his immunosuppressive regimen. However, LAD disease not critical. Thus unlikely to make CABG worth the risk at this point. Will treat with aggressive medical therapy at this point with asa and statin (LDL < 70). Needs to stop smoking! Reviewed with Dr. Burt Knack.   Theodore Bickers, MD 12:00 PM

## 2012-03-12 DIAGNOSIS — Z940 Kidney transplant status: Secondary | ICD-10-CM | POA: Diagnosis not present

## 2012-03-12 DIAGNOSIS — D649 Anemia, unspecified: Secondary | ICD-10-CM | POA: Diagnosis not present

## 2012-03-12 DIAGNOSIS — Z79899 Other long term (current) drug therapy: Secondary | ICD-10-CM | POA: Diagnosis not present

## 2012-03-31 DIAGNOSIS — D649 Anemia, unspecified: Secondary | ICD-10-CM | POA: Diagnosis not present

## 2012-03-31 DIAGNOSIS — Z79899 Other long term (current) drug therapy: Secondary | ICD-10-CM | POA: Diagnosis not present

## 2012-03-31 DIAGNOSIS — Z940 Kidney transplant status: Secondary | ICD-10-CM | POA: Diagnosis not present

## 2012-04-04 DIAGNOSIS — IMO0001 Reserved for inherently not codable concepts without codable children: Secondary | ICD-10-CM | POA: Diagnosis not present

## 2012-04-04 DIAGNOSIS — E11319 Type 2 diabetes mellitus with unspecified diabetic retinopathy without macular edema: Secondary | ICD-10-CM | POA: Diagnosis not present

## 2012-04-04 DIAGNOSIS — H35039 Hypertensive retinopathy, unspecified eye: Secondary | ICD-10-CM | POA: Diagnosis not present

## 2012-04-11 DIAGNOSIS — R5381 Other malaise: Secondary | ICD-10-CM | POA: Diagnosis not present

## 2012-04-11 DIAGNOSIS — R0989 Other specified symptoms and signs involving the circulatory and respiratory systems: Secondary | ICD-10-CM | POA: Diagnosis not present

## 2012-04-11 DIAGNOSIS — F17200 Nicotine dependence, unspecified, uncomplicated: Secondary | ICD-10-CM | POA: Diagnosis not present

## 2012-04-11 DIAGNOSIS — R0609 Other forms of dyspnea: Secondary | ICD-10-CM | POA: Diagnosis not present

## 2012-04-11 DIAGNOSIS — R5383 Other fatigue: Secondary | ICD-10-CM | POA: Diagnosis not present

## 2012-04-14 DIAGNOSIS — Z79899 Other long term (current) drug therapy: Secondary | ICD-10-CM | POA: Diagnosis not present

## 2012-04-14 DIAGNOSIS — D649 Anemia, unspecified: Secondary | ICD-10-CM | POA: Diagnosis not present

## 2012-04-14 DIAGNOSIS — Z940 Kidney transplant status: Secondary | ICD-10-CM | POA: Diagnosis not present

## 2012-04-29 DIAGNOSIS — Z79899 Other long term (current) drug therapy: Secondary | ICD-10-CM | POA: Diagnosis not present

## 2012-04-29 DIAGNOSIS — D649 Anemia, unspecified: Secondary | ICD-10-CM | POA: Diagnosis not present

## 2012-04-29 DIAGNOSIS — Z940 Kidney transplant status: Secondary | ICD-10-CM | POA: Diagnosis not present

## 2012-05-13 DIAGNOSIS — D649 Anemia, unspecified: Secondary | ICD-10-CM | POA: Diagnosis not present

## 2012-05-13 DIAGNOSIS — Z79899 Other long term (current) drug therapy: Secondary | ICD-10-CM | POA: Diagnosis not present

## 2012-05-13 DIAGNOSIS — Z940 Kidney transplant status: Secondary | ICD-10-CM | POA: Diagnosis not present

## 2012-05-21 DIAGNOSIS — D72819 Decreased white blood cell count, unspecified: Secondary | ICD-10-CM | POA: Insufficient documentation

## 2012-05-21 DIAGNOSIS — R7989 Other specified abnormal findings of blood chemistry: Secondary | ICD-10-CM | POA: Diagnosis not present

## 2012-05-21 DIAGNOSIS — Z9225 Personal history of immunosupression therapy: Secondary | ICD-10-CM | POA: Insufficient documentation

## 2012-05-21 DIAGNOSIS — I10 Essential (primary) hypertension: Secondary | ICD-10-CM | POA: Diagnosis not present

## 2012-05-21 DIAGNOSIS — Z48298 Encounter for aftercare following other organ transplant: Secondary | ICD-10-CM | POA: Diagnosis not present

## 2012-05-21 DIAGNOSIS — D649 Anemia, unspecified: Secondary | ICD-10-CM | POA: Insufficient documentation

## 2012-05-21 DIAGNOSIS — I2510 Atherosclerotic heart disease of native coronary artery without angina pectoris: Secondary | ICD-10-CM | POA: Diagnosis not present

## 2012-05-21 DIAGNOSIS — Z940 Kidney transplant status: Secondary | ICD-10-CM | POA: Diagnosis not present

## 2012-05-22 DIAGNOSIS — L509 Urticaria, unspecified: Secondary | ICD-10-CM | POA: Diagnosis not present

## 2012-05-22 DIAGNOSIS — E119 Type 2 diabetes mellitus without complications: Secondary | ICD-10-CM | POA: Diagnosis not present

## 2012-05-26 DIAGNOSIS — G4700 Insomnia, unspecified: Secondary | ICD-10-CM | POA: Diagnosis not present

## 2012-05-26 DIAGNOSIS — M2500 Hemarthrosis, unspecified joint: Secondary | ICD-10-CM | POA: Diagnosis not present

## 2012-05-26 DIAGNOSIS — E119 Type 2 diabetes mellitus without complications: Secondary | ICD-10-CM | POA: Diagnosis not present

## 2012-05-27 DIAGNOSIS — Z940 Kidney transplant status: Secondary | ICD-10-CM | POA: Diagnosis not present

## 2012-05-27 DIAGNOSIS — Z79899 Other long term (current) drug therapy: Secondary | ICD-10-CM | POA: Diagnosis not present

## 2012-06-09 DIAGNOSIS — D649 Anemia, unspecified: Secondary | ICD-10-CM | POA: Diagnosis not present

## 2012-06-09 DIAGNOSIS — Z940 Kidney transplant status: Secondary | ICD-10-CM | POA: Diagnosis not present

## 2012-06-09 DIAGNOSIS — Z79899 Other long term (current) drug therapy: Secondary | ICD-10-CM | POA: Diagnosis not present

## 2012-06-23 DIAGNOSIS — Z940 Kidney transplant status: Secondary | ICD-10-CM | POA: Diagnosis not present

## 2012-06-23 DIAGNOSIS — N185 Chronic kidney disease, stage 5: Secondary | ICD-10-CM | POA: Diagnosis not present

## 2012-06-23 DIAGNOSIS — Z23 Encounter for immunization: Secondary | ICD-10-CM | POA: Diagnosis not present

## 2012-06-23 DIAGNOSIS — I10 Essential (primary) hypertension: Secondary | ICD-10-CM | POA: Diagnosis not present

## 2012-06-23 DIAGNOSIS — I120 Hypertensive chronic kidney disease with stage 5 chronic kidney disease or end stage renal disease: Secondary | ICD-10-CM | POA: Diagnosis not present

## 2012-06-23 DIAGNOSIS — R21 Rash and other nonspecific skin eruption: Secondary | ICD-10-CM | POA: Diagnosis not present

## 2012-07-21 DIAGNOSIS — M2500 Hemarthrosis, unspecified joint: Secondary | ICD-10-CM | POA: Diagnosis not present

## 2012-07-21 DIAGNOSIS — G4700 Insomnia, unspecified: Secondary | ICD-10-CM | POA: Diagnosis not present

## 2012-07-21 DIAGNOSIS — E119 Type 2 diabetes mellitus without complications: Secondary | ICD-10-CM | POA: Diagnosis not present

## 2012-07-21 DIAGNOSIS — Z940 Kidney transplant status: Secondary | ICD-10-CM | POA: Diagnosis not present

## 2012-07-21 DIAGNOSIS — Z79899 Other long term (current) drug therapy: Secondary | ICD-10-CM | POA: Diagnosis not present

## 2012-07-21 DIAGNOSIS — D649 Anemia, unspecified: Secondary | ICD-10-CM | POA: Diagnosis not present

## 2012-07-25 DIAGNOSIS — R5381 Other malaise: Secondary | ICD-10-CM | POA: Diagnosis not present

## 2012-07-25 DIAGNOSIS — F17200 Nicotine dependence, unspecified, uncomplicated: Secondary | ICD-10-CM | POA: Diagnosis not present

## 2012-07-25 DIAGNOSIS — R0609 Other forms of dyspnea: Secondary | ICD-10-CM | POA: Diagnosis not present

## 2012-07-25 DIAGNOSIS — R0989 Other specified symptoms and signs involving the circulatory and respiratory systems: Secondary | ICD-10-CM | POA: Diagnosis not present

## 2012-07-25 DIAGNOSIS — R5383 Other fatigue: Secondary | ICD-10-CM | POA: Diagnosis not present

## 2012-07-28 DIAGNOSIS — F17200 Nicotine dependence, unspecified, uncomplicated: Secondary | ICD-10-CM | POA: Diagnosis not present

## 2012-07-28 DIAGNOSIS — N185 Chronic kidney disease, stage 5: Secondary | ICD-10-CM | POA: Diagnosis not present

## 2012-07-28 DIAGNOSIS — D649 Anemia, unspecified: Secondary | ICD-10-CM | POA: Diagnosis not present

## 2012-08-18 DIAGNOSIS — N390 Urinary tract infection, site not specified: Secondary | ICD-10-CM | POA: Diagnosis not present

## 2012-08-18 DIAGNOSIS — D649 Anemia, unspecified: Secondary | ICD-10-CM | POA: Diagnosis not present

## 2012-08-18 DIAGNOSIS — N2581 Secondary hyperparathyroidism of renal origin: Secondary | ICD-10-CM | POA: Diagnosis not present

## 2012-08-18 DIAGNOSIS — Z940 Kidney transplant status: Secondary | ICD-10-CM | POA: Diagnosis not present

## 2012-08-18 DIAGNOSIS — Z79899 Other long term (current) drug therapy: Secondary | ICD-10-CM | POA: Diagnosis not present

## 2012-08-20 ENCOUNTER — Encounter (HOSPITAL_COMMUNITY): Payer: Self-pay | Admitting: *Deleted

## 2012-08-20 ENCOUNTER — Emergency Department (INDEPENDENT_AMBULATORY_CARE_PROVIDER_SITE_OTHER): Payer: Medicare Other

## 2012-08-20 ENCOUNTER — Emergency Department (INDEPENDENT_AMBULATORY_CARE_PROVIDER_SITE_OTHER)
Admission: EM | Admit: 2012-08-20 | Discharge: 2012-08-20 | Disposition: A | Discharge status: Home / Self Care | Payer: Medicare Other | Source: Home / Self Care | Attending: Family Medicine | Admitting: Family Medicine

## 2012-08-20 DIAGNOSIS — M545 Low back pain, unspecified: Secondary | ICD-10-CM | POA: Diagnosis not present

## 2012-08-20 DIAGNOSIS — G8929 Other chronic pain: Secondary | ICD-10-CM | POA: Diagnosis not present

## 2012-08-20 DIAGNOSIS — IMO0002 Reserved for concepts with insufficient information to code with codable children: Secondary | ICD-10-CM | POA: Diagnosis not present

## 2012-08-20 MED ORDER — ACETAMINOPHEN 650 MG PO TABS: 1.0000 | ORAL_TABLET | Freq: Three times a day (TID) | ORAL | Status: DC | PRN

## 2012-08-20 MED ORDER — TRAMADOL HCL 50 MG PO TABS: 50.0000 mg | ORAL_TABLET | Freq: Three times a day (TID) | ORAL | Status: DC | PRN

## 2012-08-20 MED ORDER — CYCLOBENZAPRINE HCL 5 MG PO TABS: 5.0000 mg | ORAL_TABLET | Freq: Two times a day (BID) | ORAL | Status: DC | PRN

## 2012-08-20 NOTE — ED Provider Notes (Signed)
History     CSN: [id number]  Arrival date & time 08/20/12  1014   First MD Initiated Contact with Patient 08/20/12 1107      Chief Complaint  Patient presents with  . Back Pain    (Consider location/radiation/quality/duration/timing/severity/associated sxs/prior treatment) HPI Comments: 51 year old smoker male with history of COPD, HTN, renal transplant and chronic low back pain among other co-morbidities. Here complaining of exacerbation of low back pain. Reports pain is worse on the right lower back. Denies radiation to the leg currently; pain is worse with movement like strengthening after he's been bending or getting up from chair after he is sitting. Patient reports that pain started after lifting wood about 5 days ago. Denies dysuria or hematuria. Denies urinary incontinence. Denies fever or chills. No abdominal pain. No chest pain. Cough or difficulty breathing. Patient report he had a blood work done 2 days ago at his kidney doctor's office and  the results were reassuring as per his report. Patient reports that he has taken some leftover tramadol pills he has with minimal improvement.    Past Medical History  Diagnosis Date  . Hypertension   . Hyperlipidemia   . Kidney disease     s/p kidney transplant 2011    Past Surgical History  Procedure Date  . Kidney transplant 2011  . Hd access procedures     Family History  Problem Relation Age of Onset  . Hypertension Father   . Diabetes Mother     History  Substance Use Topics  . Smoking status: Current Every Day Smoker -- 1.0 packs/day  . Smokeless tobacco: Not on file  . Alcohol Use: No      Review of Systems  Constitutional: Negative for fever, chills, diaphoresis, appetite change and fatigue.  HENT: Negative for congestion and sore throat.   Respiratory: Negative for cough and shortness of breath.   Cardiovascular: Negative for chest pain, palpitations and leg swelling.  Gastrointestinal: Negative for  nausea, vomiting, abdominal pain and diarrhea.  Genitourinary: Negative for dysuria, urgency, frequency, hematuria and flank pain.  Musculoskeletal: Positive for back pain. Negative for joint swelling.  Skin: Negative for rash.  Neurological: Negative for dizziness and headaches.  All other systems reviewed and are negative.    Allergies  Tomato  Home Medications   Current Outpatient Rx  Name  Route  Sig  Dispense  Refill  . ACETAMINOPHEN 650 MG PO TABS   Oral   Take 1 tablet (650 mg total) by mouth 3 (three) times daily as needed.   30 tablet   0   . ALPRAZOLAM 1 MG PO TABS   Oral   Take 1 mg by mouth daily as needed. For anxiety.         . AMLODIPINE BESYLATE 10 MG PO TABS   Oral   Take 10 mg by mouth every evening.          . ASPIRIN 81 MG PO TABS   Oral   Take 81 mg by mouth daily.         . BUDESONIDE 180 MCG/ACT IN AEPB   Inhalation   Inhale 2 puffs into the lungs 2 (two) times daily.         Marland Kitchen CARVEDILOL 25 MG PO TABS   Oral   Take 25 mg by mouth 2 (two) times daily with a meal.         . COLCHICINE 0.6 MG PO TABS   Oral   Take 0.6 mg  by mouth daily.         . CYCLOBENZAPRINE HCL 5 MG PO TABS   Oral   Take 1 tablet (5 mg total) by mouth 2 (two) times daily as needed for muscle spasms.   10 tablet   0   . GABAPENTIN 100 MG PO CAPS   Oral   Take 100 mg by mouth 2 (two) times daily.         Marland Kitchen MAGNESIUM OXIDE 400 MG PO TABS   Oral   Take 400 mg by mouth every evening.          Marland Kitchen MYCOPHENOLATE SODIUM 360 MG PO TBEC   Oral   Take 720 mg by mouth 2 (two) times daily.         Marland Kitchen OMEPRAZOLE 20 MG PO CPDR   Oral   Take 20 mg by mouth daily.         Marland Kitchen PIOGLITAZONE HCL 15 MG PO TABS   Oral   Take 15 mg by mouth daily.         Marland Kitchen PREDNISONE 5 MG PO TABS   Oral   Take 5 mg by mouth every morning.          Marland Kitchen SIMVASTATIN 40 MG PO TABS   Oral   Take 40 mg by mouth every Monday, Wednesday, and Friday.          . SODIUM  BICARBONATE 650 MG PO TABS   Oral   Take 1,300 mg by mouth 2 (two) times daily.         Marland Kitchen TACROLIMUS 1 MG PO CAPS   Oral   Take 3-4 mg by mouth 2 (two) times daily. Take 4 mg every AM and 3 mg every PM         . TRAMADOL HCL 50 MG PO TABS   Oral   Take 1 tablet (50 mg total) by mouth every 8 (eight) hours as needed for pain.   20 tablet   0   . VITAMIN E 400 UNITS PO CAPS   Oral   Take 800 Units by mouth daily.           BP 139/83  Pulse 61  Temp 98.4 F (36.9 C) (Oral)  Resp 18  Wt 165 lb 8 oz (75.07 kg)  SpO2 99%  Physical Exam  Nursing note and vitals reviewed. Constitutional: He is oriented to person, place, and time. He appears well-developed and well-nourished. No distress.  HENT:  Head: Normocephalic and atraumatic.  Neck:       No supraclavicular enlarged lymph nodes.  Cardiovascular: Normal rate, regular rhythm and normal heart sounds.   Pulmonary/Chest: Effort normal and breath sounds normal. No respiratory distress. He has no wheezes.  Abdominal: Soft. There is no tenderness.  Musculoskeletal:       Central spine with no scoliosis or kyphosis. Fair anterior flexion past 90 degrees. Pain reported with patient getting back to straight position after anteflexion also discomfort and limited posterior extension. Patient able to walk on tip toes and heels with no difficulty foot drop or pain exacerbation. No bone prominence tenderness. Tenderness to palpation in bilateral lumbar paravertebral muscles worse in right side.  Negative straight leg test bilateral. Intact sensation and symmetric + DTRs (rotullian and achillean) in low extremities.    Lymphadenopathy:    He has no cervical adenopathy.  Neurological: He is alert and oriented to person, place, and time.  Skin: No rash noted. He is not  diaphoretic.    ED Course  Procedures (including critical care time)  Labs Reviewed - No data to display Dg Lumbar Spine Complete  08/20/2012  *RADIOLOGY  REPORT*  Clinical Data: Lifting injury with low back pain.  LUMBAR SPINE - COMPLETE 4+ VIEW  Comparison: 12/13/2008.  Findings: There is straightening of the normal lumbar lordosis, as before.  Endplate degenerative changes and loss of disc space height are worst at L4-5 and may be minimally progressive.  Less severe degenerative disc disease at L3-4, also with loss of disc space height.  Facet hypertrophy in the lower lumbar spine.  Slight upward tilt of the T12 inferior endplate is unchanged. Atherosclerotic calcification of the arterial vasculature.  IMPRESSION: Spondylosis may be minimally progressive from 12/13/2008.  Findings are worst at L4-5.   Original Report Authenticated By: Lorin Picket, M.D.      1. Chronic low back pain       MDM  Impress exacerbation of chronic low back pain. No fractures or lytic lesions on x-rays. Avoided NSAIDs given history of kidney transplant. Prescribed acetaminophen 650 mg 3 times a day for baseline. Prescribe Flexeril 5 mg twice a day only for 5 days and tramadol 3 times a day when necessary for breakthrough pain. Patient was made aware of possible side effects of medications like drowsiness and increased risk for falls. Handout for back exercises provided. Asked to followup with his primary care provider if no improvement or worsening symptoms despite following treatment.         Randa Spike, MD 08/21/12 [phone]

## 2012-08-20 NOTE — ED Notes (Signed)
Pt  Reports  About  5  Days  Of  Back  Pain  Which  He  reportys  Is  Worse  On movement  And  Certain  posistions  Pt  Reports  Was  Lifting  Wood  About  5  Days  Ago    -  He  denys  Any  Urinary  Symptoms  He  Ambulated  To room  With a  Steady  gait

## 2012-08-23 ENCOUNTER — Encounter (HOSPITAL_COMMUNITY): Payer: Self-pay | Admitting: Emergency Medicine

## 2012-08-23 ENCOUNTER — Emergency Department (HOSPITAL_COMMUNITY)
Admission: EM | Admit: 2012-08-23 | Discharge: 2012-08-23 | Disposition: A | Discharge status: Home / Self Care | Payer: Medicare Other | Attending: Emergency Medicine | Admitting: Emergency Medicine

## 2012-08-23 DIAGNOSIS — Z940 Kidney transplant status: Secondary | ICD-10-CM | POA: Insufficient documentation

## 2012-08-23 DIAGNOSIS — F17200 Nicotine dependence, unspecified, uncomplicated: Secondary | ICD-10-CM | POA: Diagnosis not present

## 2012-08-23 DIAGNOSIS — Z79899 Other long term (current) drug therapy: Secondary | ICD-10-CM | POA: Insufficient documentation

## 2012-08-23 DIAGNOSIS — E785 Hyperlipidemia, unspecified: Secondary | ICD-10-CM | POA: Diagnosis not present

## 2012-08-23 DIAGNOSIS — I10 Essential (primary) hypertension: Secondary | ICD-10-CM | POA: Diagnosis not present

## 2012-08-23 DIAGNOSIS — B029 Zoster without complications: Secondary | ICD-10-CM | POA: Diagnosis not present

## 2012-08-23 DIAGNOSIS — M545 Low back pain, unspecified: Secondary | ICD-10-CM | POA: Diagnosis not present

## 2012-08-23 DIAGNOSIS — R52 Pain, unspecified: Secondary | ICD-10-CM | POA: Diagnosis not present

## 2012-08-23 MED ORDER — MORPHINE SULFATE 30 MG PO TABS: 15.0000 mg | ORAL_TABLET | ORAL | Status: DC | PRN

## 2012-08-23 MED ORDER — ONDANSETRON 4 MG PO TBDP
4.0000 mg | ORAL_TABLET | Freq: Once | ORAL | Status: AC
  Administered 2012-08-23: 4 mg via ORAL
  Filled 2012-08-23: qty 1

## 2012-08-23 MED ORDER — MORPHINE SULFATE 4 MG/ML IJ SOLN
6.0000 mg | Freq: Once | INTRAMUSCULAR | Status: AC
  Administered 2012-08-23: 6 mg via INTRAMUSCULAR
  Filled 2012-08-23: qty 2

## 2012-08-23 MED ORDER — PREDNISONE 20 MG PO TABS
60.0000 mg | ORAL_TABLET | Freq: Once | ORAL | Status: DC
  Administered 2012-08-23: 60 mg via ORAL
  Filled 2012-08-23: qty 3

## 2012-08-23 MED ORDER — VALACYCLOVIR HCL 1 G PO TABS: 1000.0000 mg | ORAL_TABLET | Freq: Three times a day (TID) | ORAL | Status: AC

## 2012-08-23 MED ORDER — OXYCODONE-ACETAMINOPHEN 5-325 MG PO TABS: ORAL_TABLET | ORAL | Status: DC

## 2012-08-23 MED ORDER — VALACYCLOVIR HCL 500 MG PO TABS
1000.0000 mg | ORAL_TABLET | Freq: Once | ORAL | Status: AC
  Administered 2012-08-23: 1000 mg via ORAL
  Filled 2012-08-23: qty 2

## 2012-08-23 NOTE — ED Provider Notes (Signed)
Medical screening examination/treatment/procedure(s) were performed by non-physician practitioner and as supervising physician I was immediately available for consultation/collaboration.   Charles B. Karle Starch, MD 08/23/12 1438

## 2012-08-23 NOTE — ED Provider Notes (Signed)
History     CSN: [id number]  Arrival date & time 08/23/12  1241   First MD Initiated Contact with Patient 08/23/12 1335      Chief Complaint  Patient presents with  . Back Pain    (Consider location/radiation/quality/duration/timing/severity/associated sxs/prior treatment) HPI  Theodore Arellano is a 51 y.o. male complaining of low back pain worsening over the course of a week. Patient has been seen for similar at urgent care 3 days ago he was given a prescription for Flexeril and tramadol without relief. Patient denies any fever, change in bowel or bladder habits, history of cancer or IV drug use. Pain is exacerbated by standing up. He patient states he has difficulty walking and he had to take an ambulance here today. Pain is severe described as burning 10 out of 10.    Past Medical History  Diagnosis Date  . Hypertension   . Hyperlipidemia   . Kidney disease     s/p kidney transplant 2011    Past Surgical History  Procedure Date  . Kidney transplant 2011  . Hd access procedures     Family History  Problem Relation Age of Onset  . Hypertension Father   . Diabetes Mother     History  Substance Use Topics  . Smoking status: Current Every Day Smoker -- 1.0 packs/day  . Smokeless tobacco: Not on file  . Alcohol Use: No      Review of Systems  Allergies  Tomato  Home Medications   Current Outpatient Rx  Name  Route  Sig  Dispense  Refill  . ACETAMINOPHEN 650 MG PO TABS   Oral   Take 1 tablet (650 mg total) by mouth 3 (three) times daily as needed.   30 tablet   0   . ALPRAZOLAM 1 MG PO TABS   Oral   Take 1 mg by mouth daily as needed. For anxiety.         . ASPIRIN 81 MG PO TABS   Oral   Take 81 mg by mouth daily.         Marland Kitchen AMLODIPINE BESYLATE 10 MG PO TABS   Oral   Take 10 mg by mouth every evening.          . BUDESONIDE 180 MCG/ACT IN AEPB   Inhalation   Inhale 2 puffs into the lungs 2 (two) times daily.         Marland Kitchen CARVEDILOL 25 MG PO  TABS   Oral   Take 25 mg by mouth 2 (two) times daily with a meal.         . COLCHICINE 0.6 MG PO TABS   Oral   Take 0.6 mg by mouth daily.         . CYCLOBENZAPRINE HCL 5 MG PO TABS   Oral   Take 1 tablet (5 mg total) by mouth 2 (two) times daily as needed for muscle spasms.   10 tablet   0   . GABAPENTIN 100 MG PO CAPS   Oral   Take 100 mg by mouth 2 (two) times daily.         Marland Kitchen MAGNESIUM OXIDE 400 MG PO TABS   Oral   Take 400 mg by mouth every evening.          Marland Kitchen MYCOPHENOLATE SODIUM 360 MG PO TBEC   Oral   Take 720 mg by mouth 2 (two) times daily.         Marland Kitchen OMEPRAZOLE  20 MG PO CPDR   Oral   Take 20 mg by mouth daily.         Marland Kitchen PIOGLITAZONE HCL 15 MG PO TABS   Oral   Take 15 mg by mouth daily.         Marland Kitchen PREDNISONE 5 MG PO TABS   Oral   Take 5 mg by mouth every morning.          Marland Kitchen SIMVASTATIN 40 MG PO TABS   Oral   Take 40 mg by mouth every Monday, Wednesday, and Friday.          . SODIUM BICARBONATE 650 MG PO TABS   Oral   Take 1,300 mg by mouth 2 (two) times daily.         Marland Kitchen TACROLIMUS 1 MG PO CAPS   Oral   Take 3-4 mg by mouth 2 (two) times daily. Take 4 mg every AM and 3 mg every PM         . TRAMADOL HCL 50 MG PO TABS   Oral   Take 1 tablet (50 mg total) by mouth every 8 (eight) hours as needed for pain.   20 tablet   0   . VITAMIN E 400 UNITS PO CAPS   Oral   Take 800 Units by mouth daily.           BP 154/86  Pulse 69  Temp 98 F (36.7 C) (Oral)  Resp 14  SpO2 96%  Physical Exam  Nursing note and vitals reviewed. Constitutional: He is oriented to person, place, and time. He appears well-developed and well-nourished. No distress.  HENT:  Head: Normocephalic.  Eyes: Conjunctivae normal and EOM are normal.  Cardiovascular: Normal rate, regular rhythm and intact distal pulses.   Pulmonary/Chest: Effort normal and breath sounds normal. No stridor.  Abdominal: Soft. Bowel sounds are normal. He exhibits no  distension and no mass. There is no tenderness. There is no rebound and no guarding.  Musculoskeletal: Normal range of motion.  Neurological: He is alert and oriented to person, place, and time.  Skin:       Multiple confluent vesicles on an erythematous base to the right L4 dermatome.  Psychiatric: He has a normal mood and affect.    ED Course  Procedures (including critical care time)  Labs Reviewed - No data to display No results found.   1. Zoster       MDM   Zoster, I will advise him to DC Flexeril and tramadol and I'll write for Percocet and Valtrex.        Monico Blitz, PA-C 08/23/12 1431

## 2012-08-23 NOTE — ED Notes (Signed)
PT discharged to home with family. NAD. 

## 2012-08-23 NOTE — ED Notes (Signed)
Pt presents to ED via EMS with c/o of back pain. Pt  States he was moving lumber last week. Reports pain on right lower side of back. NAD

## 2012-08-25 DIAGNOSIS — G4700 Insomnia, unspecified: Secondary | ICD-10-CM | POA: Diagnosis not present

## 2012-08-25 DIAGNOSIS — B029 Zoster without complications: Secondary | ICD-10-CM | POA: Diagnosis not present

## 2012-08-25 DIAGNOSIS — E119 Type 2 diabetes mellitus without complications: Secondary | ICD-10-CM | POA: Diagnosis not present

## 2012-09-11 DIAGNOSIS — G4700 Insomnia, unspecified: Secondary | ICD-10-CM | POA: Diagnosis not present

## 2012-09-11 DIAGNOSIS — M2559 Pain in other specified joint: Secondary | ICD-10-CM | POA: Diagnosis not present

## 2012-09-11 DIAGNOSIS — E7800 Pure hypercholesterolemia, unspecified: Secondary | ICD-10-CM | POA: Diagnosis not present

## 2012-09-11 DIAGNOSIS — E039 Hypothyroidism, unspecified: Secondary | ICD-10-CM | POA: Diagnosis not present

## 2012-09-11 DIAGNOSIS — Z125 Encounter for screening for malignant neoplasm of prostate: Secondary | ICD-10-CM | POA: Diagnosis not present

## 2012-09-11 DIAGNOSIS — R5381 Other malaise: Secondary | ICD-10-CM | POA: Diagnosis not present

## 2012-09-11 DIAGNOSIS — E119 Type 2 diabetes mellitus without complications: Secondary | ICD-10-CM | POA: Diagnosis not present

## 2012-10-20 DIAGNOSIS — Z79899 Other long term (current) drug therapy: Secondary | ICD-10-CM | POA: Diagnosis not present

## 2012-10-20 DIAGNOSIS — D649 Anemia, unspecified: Secondary | ICD-10-CM | POA: Diagnosis not present

## 2012-10-20 DIAGNOSIS — Z940 Kidney transplant status: Secondary | ICD-10-CM | POA: Diagnosis not present

## 2012-10-20 DIAGNOSIS — N2581 Secondary hyperparathyroidism of renal origin: Secondary | ICD-10-CM | POA: Diagnosis not present

## 2012-11-03 DIAGNOSIS — T50901A Poisoning by unspecified drugs, medicaments and biological substances, accidental (unintentional), initial encounter: Secondary | ICD-10-CM | POA: Diagnosis not present

## 2012-11-24 DIAGNOSIS — E119 Type 2 diabetes mellitus without complications: Secondary | ICD-10-CM | POA: Diagnosis not present

## 2012-11-24 DIAGNOSIS — D649 Anemia, unspecified: Secondary | ICD-10-CM | POA: Diagnosis not present

## 2012-11-24 DIAGNOSIS — G8929 Other chronic pain: Secondary | ICD-10-CM | POA: Diagnosis not present

## 2012-11-24 DIAGNOSIS — Z940 Kidney transplant status: Secondary | ICD-10-CM | POA: Diagnosis not present

## 2012-11-24 DIAGNOSIS — Z79899 Other long term (current) drug therapy: Secondary | ICD-10-CM | POA: Diagnosis not present

## 2012-11-24 DIAGNOSIS — I10 Essential (primary) hypertension: Secondary | ICD-10-CM | POA: Diagnosis not present

## 2012-11-24 DIAGNOSIS — N2581 Secondary hyperparathyroidism of renal origin: Secondary | ICD-10-CM | POA: Diagnosis not present

## 2012-11-24 DIAGNOSIS — G4700 Insomnia, unspecified: Secondary | ICD-10-CM | POA: Diagnosis not present

## 2012-12-28 DIAGNOSIS — M549 Dorsalgia, unspecified: Secondary | ICD-10-CM | POA: Diagnosis not present

## 2012-12-29 DIAGNOSIS — D649 Anemia, unspecified: Secondary | ICD-10-CM | POA: Diagnosis not present

## 2012-12-29 DIAGNOSIS — N2581 Secondary hyperparathyroidism of renal origin: Secondary | ICD-10-CM | POA: Diagnosis not present

## 2012-12-29 DIAGNOSIS — N185 Chronic kidney disease, stage 5: Secondary | ICD-10-CM | POA: Diagnosis not present

## 2012-12-30 DIAGNOSIS — Z79899 Other long term (current) drug therapy: Secondary | ICD-10-CM | POA: Diagnosis not present

## 2012-12-30 DIAGNOSIS — D649 Anemia, unspecified: Secondary | ICD-10-CM | POA: Diagnosis not present

## 2012-12-30 DIAGNOSIS — N2581 Secondary hyperparathyroidism of renal origin: Secondary | ICD-10-CM | POA: Diagnosis not present

## 2012-12-30 DIAGNOSIS — Z940 Kidney transplant status: Secondary | ICD-10-CM | POA: Diagnosis not present

## 2013-01-05 DIAGNOSIS — I10 Essential (primary) hypertension: Secondary | ICD-10-CM | POA: Diagnosis not present

## 2013-01-05 DIAGNOSIS — Z79899 Other long term (current) drug therapy: Secondary | ICD-10-CM | POA: Diagnosis not present

## 2013-01-05 DIAGNOSIS — Z5181 Encounter for therapeutic drug level monitoring: Secondary | ICD-10-CM | POA: Diagnosis not present

## 2013-01-05 DIAGNOSIS — E119 Type 2 diabetes mellitus without complications: Secondary | ICD-10-CM | POA: Diagnosis not present

## 2013-01-05 DIAGNOSIS — G8929 Other chronic pain: Secondary | ICD-10-CM | POA: Diagnosis not present

## 2013-01-06 ENCOUNTER — Emergency Department (HOSPITAL_COMMUNITY)
Admission: EM | Admit: 2013-01-06 | Discharge: 2013-01-06 | Disposition: A | Discharge status: Home / Self Care | Payer: Medicare Other | Attending: Emergency Medicine | Admitting: Emergency Medicine

## 2013-01-06 ENCOUNTER — Encounter (HOSPITAL_COMMUNITY): Payer: Self-pay

## 2013-01-06 DIAGNOSIS — R209 Unspecified disturbances of skin sensation: Secondary | ICD-10-CM | POA: Diagnosis not present

## 2013-01-06 DIAGNOSIS — Y929 Unspecified place or not applicable: Secondary | ICD-10-CM | POA: Insufficient documentation

## 2013-01-06 DIAGNOSIS — Z79899 Other long term (current) drug therapy: Secondary | ICD-10-CM | POA: Diagnosis not present

## 2013-01-06 DIAGNOSIS — IMO0002 Reserved for concepts with insufficient information to code with codable children: Secondary | ICD-10-CM | POA: Insufficient documentation

## 2013-01-06 DIAGNOSIS — I10 Essential (primary) hypertension: Secondary | ICD-10-CM | POA: Diagnosis not present

## 2013-01-06 DIAGNOSIS — E785 Hyperlipidemia, unspecified: Secondary | ICD-10-CM | POA: Diagnosis not present

## 2013-01-06 DIAGNOSIS — G8929 Other chronic pain: Secondary | ICD-10-CM | POA: Diagnosis not present

## 2013-01-06 DIAGNOSIS — Z940 Kidney transplant status: Secondary | ICD-10-CM | POA: Diagnosis not present

## 2013-01-06 DIAGNOSIS — Z87448 Personal history of other diseases of urinary system: Secondary | ICD-10-CM | POA: Diagnosis not present

## 2013-01-06 DIAGNOSIS — X503XXA Overexertion from repetitive movements, initial encounter: Secondary | ICD-10-CM | POA: Insufficient documentation

## 2013-01-06 DIAGNOSIS — S335XXA Sprain of ligaments of lumbar spine, initial encounter: Secondary | ICD-10-CM | POA: Insufficient documentation

## 2013-01-06 DIAGNOSIS — F17200 Nicotine dependence, unspecified, uncomplicated: Secondary | ICD-10-CM | POA: Insufficient documentation

## 2013-01-06 DIAGNOSIS — S39012S Strain of muscle, fascia and tendon of lower back, sequela: Secondary | ICD-10-CM

## 2013-01-06 DIAGNOSIS — Y9389 Activity, other specified: Secondary | ICD-10-CM | POA: Insufficient documentation

## 2013-01-06 DIAGNOSIS — Z7982 Long term (current) use of aspirin: Secondary | ICD-10-CM | POA: Diagnosis not present

## 2013-01-06 MED ORDER — OXYCODONE-ACETAMINOPHEN 5-325 MG PO TABS: 1.0000 | ORAL_TABLET | ORAL | Status: DC | PRN

## 2013-01-06 MED ORDER — CYCLOBENZAPRINE HCL 10 MG PO TABS: 10.0000 mg | ORAL_TABLET | Freq: Two times a day (BID) | ORAL | Status: DC | PRN

## 2013-01-06 NOTE — ED Notes (Signed)
Pt states loaded wood 2 weeks ago and had pain afterwards,  States last 3 days pain has gotten worse and hurts in his "butt cheek"  Pt is able to walk.

## 2013-01-06 NOTE — ED Provider Notes (Signed)
History    This chart was scribed for non-physician practitioner working with Janice Norrie, MD by Kathreen Cornfield, ED Scribe. This patient was seen in room WTR5/WTR5 and the patient's care was started at 10:53PM.   CSN: [id number]  Arrival date & time 01/06/13  2201   First MD Initiated Contact with Patient 01/06/13 2253      Chief Complaint  Patient presents with  . Back Pain    (Consider location/radiation/quality/duration/timing/severity/associated sxs/prior treatment) The history is provided by the patient. No language interpreter was used.    Theodore Arellano is a 52 y.o. male , with a hx of hypertension, hyperlipidemia, kidney disease, kidney transplant and chronic back pain, who presents to the Emergency Department complaining of gradual, progressively worsening, back pain, located at the lumbar region, radiating downwards towards the bilateral lower extremities, onset two weeks ago.  Associated symptoms include numbness located at the right knee. The pt reports he was loading wood two weeks ago, where he believes he may have aggravated his chronic back pain. The pt characterizes his back pain as a constant, dull, aching sensation, intensified by certain movements and positions, in addition to ambulation. Furthermore, the pt informs he usually walks 2-3 miles per day at baseline, however, he has been utilizing a cane at home to assist with his ambulation for the past two weeks.  The pt denies bladder/bowel incontinence, dysuria, hematuria, fever, chills, nausea, vomiting, chest pain, and shortness of breath.    The pt is a current everyday smoker, however, he does not drink alcohol.    PCP is Dr. Alfonse Spruce.    Past Medical History  Diagnosis Date  . Hypertension   . Hyperlipidemia   . Kidney disease     s/p kidney transplant 2011    Past Surgical History  Procedure Laterality Date  . Kidney transplant  2011  . Hd access procedures      Family History  Problem Relation Age of Onset   . Hypertension Father   . Diabetes Mother     History  Substance Use Topics  . Smoking status: Current Every Day Smoker -- 1.00 packs/day  . Smokeless tobacco: Not on file  . Alcohol Use: No      Review of Systems  Constitutional: Negative for fever and chills.  HENT: Negative for neck pain.   Respiratory: Negative for shortness of breath.   Cardiovascular: Negative for chest pain.  Gastrointestinal: Negative for nausea, vomiting, abdominal pain, blood in stool and anal bleeding.  Genitourinary: Negative.  Negative for dysuria, hematuria, flank pain and difficulty urinating.  Musculoskeletal: Positive for back pain.  Skin: Negative.   Neurological: Negative.     Allergies  Tomato  Home Medications   Current Outpatient Rx  Name  Route  Sig  Dispense  Refill  . Acetaminophen 650 MG TABS   Oral   Take 1 tablet by mouth 3 (three) times daily as needed. For pain         . ALPRAZolam (XANAX) 1 MG tablet   Oral   Take 1 mg by mouth daily as needed. For anxiety.         Marland Kitchen amLODipine (NORVASC) 10 MG tablet   Oral   Take 5 mg by mouth every evening.          Marland Kitchen aspirin EC 81 MG tablet   Oral   Take 81 mg by mouth daily.         . budesonide (PULMICORT) 180 MCG/ACT inhaler  Inhalation   Inhale 2 puffs into the lungs 2 (two) times daily.         . carvedilol (COREG) 25 MG tablet   Oral   Take 25 mg by mouth 2 (two) times daily with a meal.         . ciprofloxacin (CIPRO) 500 MG tablet   Oral   Take 500 mg by mouth 2 (two) times daily.         . colchicine 0.6 MG tablet   Oral   Take 0.6 mg by mouth daily as needed. For gout         . gabapentin (NEURONTIN) 100 MG capsule   Oral   Take 100 mg by mouth 2 (two) times daily.         Marland Kitchen lisinopril (PRINIVIL,ZESTRIL) 10 MG tablet   Oral   Take 10 mg by mouth daily.         . magnesium oxide (MAG-OX) 400 MG tablet   Oral   Take 400 mg by mouth every evening.          . mycophenolate  (MYFORTIC) 360 MG TBEC   Oral   Take 720 mg by mouth 2 (two) times daily.         Marland Kitchen omeprazole (PRILOSEC) 20 MG capsule   Oral   Take 20 mg by mouth daily.         Marland Kitchen oxyCODONE-acetaminophen (PERCOCET/ROXICET) 5-325 MG per tablet      1 to 2 tabs PO q6hrs  PRN for pain   15 tablet   0   . pioglitazone (ACTOS) 15 MG tablet   Oral   Take 15 mg by mouth daily.         . predniSONE (DELTASONE) 5 MG tablet   Oral   Take 5 mg by mouth every morning.          . simvastatin (ZOCOR) 40 MG tablet   Oral   Take 40 mg by mouth every Monday, Wednesday, and Friday.          . sodium bicarbonate 650 MG tablet   Oral   Take 1,300 mg by mouth 2 (two) times daily.         . tacrolimus (PROGRAF) 1 MG capsule   Oral   Take 3-4 mg by mouth 2 (two) times daily. Take 4 mg every AM and 3 mg every PM         . vitamin E 400 UNIT capsule   Oral   Take 800 Units by mouth daily.           BP 140/92  Pulse 60  Temp(Src) 97.7 F (36.5 C) (Oral)  Resp 18  SpO2 100%  Physical Exam  Nursing note and vitals reviewed. Constitutional: He is oriented to person, place, and time. He appears well-developed and well-nourished. No distress.  HENT:  Head: Normocephalic and atraumatic.  Eyes: EOM are normal.  Neck: Neck supple. No tracheal deviation present.  Cardiovascular: Normal rate, regular rhythm and normal heart sounds.   Pulmonary/Chest: Effort normal and breath sounds normal. No respiratory distress. He has no wheezes.  Abdominal: Soft. Bowel sounds are normal. There is no tenderness. There is no CVA tenderness.  Musculoskeletal: Normal range of motion.       Cervical back: Normal.       Thoracic back: Normal.       Lumbar back: He exhibits tenderness.       Back:  Bilateral lumbar tenderness.  Full ROM on exam. No posterior midline tenderness.   Neurological: He is alert and oriented to person, place, and time. He has normal strength. No cranial nerve deficit or sensory  deficit. Coordination and gait normal. GCS eye subscore is 4. GCS verbal subscore is 5. GCS motor subscore is 6.  Skin: Skin is warm and dry.  Psychiatric: He has a normal mood and affect. His behavior is normal.    ED Course  Procedures (including critical care time)     DIAGNOSTIC STUDIES: Oxygen Saturation is 100% on room air, normal by my interpretation.    COORDINATION OF CARE:   11:02 PM- Treatment plan concerning management of muscle spasm and follow up with orthopedist discussed with patient. Pt agrees with treatment.     Labs Reviewed - No data to display No results found.   1. Lumbar strain, sequela       MDM  Patient with back pain.  No neurological deficits and normal neuro exam.  Patient can walk but states is painful.  No loss of bowel or bladder control.  No concern for cauda equina.  No fever, night sweats, weight loss, h/o cancer, IVDU.  RICE protocol and pain medicine indicated and discussed with patient. Advised to follow up with PCP and orthopedics. Patient agreeable to plan. Patient is stable at time of discharge.         I personally performed the services described in this documentation, which was scribed in my presence. The recorded information has been reviewed and is accurate.     Harlow Mares, PA-C 01/07/13 0236

## 2013-01-07 NOTE — ED Provider Notes (Signed)
Medical screening examination/treatment/procedure(s) were performed by non-physician practitioner and as supervising physician I was immediately available for consultation/collaboration. Rolland Porter, MD, Abram Sander   Janice Norrie, MD 01/07/13 [phone]

## 2013-01-12 DIAGNOSIS — M25559 Pain in unspecified hip: Secondary | ICD-10-CM | POA: Diagnosis not present

## 2013-01-12 DIAGNOSIS — Z905 Acquired absence of kidney: Secondary | ICD-10-CM | POA: Diagnosis not present

## 2013-01-12 DIAGNOSIS — I10 Essential (primary) hypertension: Secondary | ICD-10-CM | POA: Diagnosis not present

## 2013-01-12 DIAGNOSIS — S79919A Unspecified injury of unspecified hip, initial encounter: Secondary | ICD-10-CM | POA: Diagnosis not present

## 2013-01-12 DIAGNOSIS — S79929A Unspecified injury of unspecified thigh, initial encounter: Secondary | ICD-10-CM | POA: Diagnosis not present

## 2013-01-12 DIAGNOSIS — F17200 Nicotine dependence, unspecified, uncomplicated: Secondary | ICD-10-CM | POA: Diagnosis not present

## 2013-01-12 DIAGNOSIS — N289 Disorder of kidney and ureter, unspecified: Secondary | ICD-10-CM | POA: Diagnosis not present

## 2013-01-15 ENCOUNTER — Telehealth (HOSPITAL_COMMUNITY): Payer: Self-pay | Admitting: Emergency Medicine

## 2013-01-15 DIAGNOSIS — E119 Type 2 diabetes mellitus without complications: Secondary | ICD-10-CM | POA: Diagnosis not present

## 2013-01-15 DIAGNOSIS — M545 Low back pain, unspecified: Secondary | ICD-10-CM | POA: Diagnosis not present

## 2013-01-15 DIAGNOSIS — I10 Essential (primary) hypertension: Secondary | ICD-10-CM | POA: Diagnosis not present

## 2013-01-15 DIAGNOSIS — Z79899 Other long term (current) drug therapy: Secondary | ICD-10-CM | POA: Diagnosis not present

## 2013-01-15 NOTE — ED Notes (Signed)
Pt in medical records trying to find name of orthopedic pt given referral too.  Informed her it was Dr Altamese Franklin office # given

## 2013-01-19 ENCOUNTER — Emergency Department (HOSPITAL_COMMUNITY): Payer: Medicare Other

## 2013-01-19 ENCOUNTER — Encounter (HOSPITAL_COMMUNITY): Payer: Self-pay | Admitting: *Deleted

## 2013-01-19 ENCOUNTER — Emergency Department (HOSPITAL_COMMUNITY)
Admission: EM | Admit: 2013-01-19 | Discharge: 2013-01-19 | Disposition: A | Discharge status: Home / Self Care | Payer: Medicare Other | Attending: Emergency Medicine | Admitting: Emergency Medicine

## 2013-01-19 DIAGNOSIS — M25559 Pain in unspecified hip: Secondary | ICD-10-CM | POA: Insufficient documentation

## 2013-01-19 DIAGNOSIS — F17200 Nicotine dependence, unspecified, uncomplicated: Secondary | ICD-10-CM | POA: Diagnosis not present

## 2013-01-19 DIAGNOSIS — Z940 Kidney transplant status: Secondary | ICD-10-CM | POA: Insufficient documentation

## 2013-01-19 DIAGNOSIS — M25551 Pain in right hip: Secondary | ICD-10-CM

## 2013-01-19 DIAGNOSIS — Z7982 Long term (current) use of aspirin: Secondary | ICD-10-CM | POA: Diagnosis not present

## 2013-01-19 DIAGNOSIS — Z79899 Other long term (current) drug therapy: Secondary | ICD-10-CM | POA: Diagnosis not present

## 2013-01-19 DIAGNOSIS — E785 Hyperlipidemia, unspecified: Secondary | ICD-10-CM | POA: Diagnosis not present

## 2013-01-19 DIAGNOSIS — IMO0002 Reserved for concepts with insufficient information to code with codable children: Secondary | ICD-10-CM | POA: Diagnosis not present

## 2013-01-19 DIAGNOSIS — I10 Essential (primary) hypertension: Secondary | ICD-10-CM | POA: Insufficient documentation

## 2013-01-19 LAB — CBC WITH DIFFERENTIAL/PLATELET
Basophils Absolute: 0 10*3/uL (ref 0.0–0.1)
Basophils Relative: 0 % (ref 0–1)
Eosinophils Absolute: 0.2 10*3/uL (ref 0.0–0.7)
Eosinophils Relative: 5 % (ref 0–5)
HCT: 45.7 % (ref 39.0–52.0)
Hemoglobin: 15.7 g/dL (ref 13.0–17.0)
Lymphocytes Relative: 17 % (ref 12–46)
Lymphs Abs: 0.8 10*3/uL (ref 0.7–4.0)
MCH: 29.1 pg (ref 26.0–34.0)
MCHC: 34.4 g/dL (ref 30.0–36.0)
MCV: 84.8 fL (ref 78.0–100.0)
Monocytes Absolute: 0.6 10*3/uL (ref 0.1–1.0)
Monocytes Relative: 13 % — ABNORMAL HIGH (ref 3–12)
Neutro Abs: 3.1 10*3/uL (ref 1.7–7.7)
Neutrophils Relative %: 65 % (ref 43–77)
Platelets: 186 10*3/uL (ref 150–400)
RBC: 5.39 MIL/uL (ref 4.22–5.81)
RDW: 14.7 % (ref 11.5–15.5)
WBC: 4.8 10*3/uL (ref 4.0–10.5)

## 2013-01-19 LAB — BASIC METABOLIC PANEL
BUN: 22 mg/dL (ref 6–23)
CO2: 24 mEq/L (ref 19–32)
Calcium: 9.8 mg/dL (ref 8.4–10.5)
Chloride: 104 mEq/L (ref 96–112)
Creatinine, Ser: 1.2 mg/dL (ref 0.50–1.35)
GFR calc Af Amer: 79 mL/min — ABNORMAL LOW (ref >90)
GFR calc non Af Amer: 68 mL/min — ABNORMAL LOW (ref >90)
Glucose, Bld: 93 mg/dL (ref 70–99)
Potassium: 4.3 mEq/L (ref 3.5–5.1)
Sodium: 136 mEq/L (ref 135–145)

## 2013-01-19 LAB — SEDIMENTATION RATE: Sed Rate: 15 mm/hr (ref 0–16)

## 2013-01-19 MED ORDER — OXYCODONE-ACETAMINOPHEN 5-325 MG PO TABS
1.0000 | ORAL_TABLET | Freq: Once | ORAL | Status: AC
  Administered 2013-01-19: 1 via ORAL
  Filled 2013-01-19: qty 1

## 2013-01-19 MED ORDER — HYDROCODONE-ACETAMINOPHEN 5-325 MG PO TABS: 1.0000 | ORAL_TABLET | Freq: Four times a day (QID) | ORAL | Status: DC | PRN
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
## 2013-01-19 NOTE — ED Notes (Signed)
Pt requesting pain med refill

## 2013-01-19 NOTE — ED Notes (Signed)
Pt given and explained all discharge instructions; stated understanding; stable at time of discharge

## 2013-01-19 NOTE — ED Provider Notes (Signed)
History     CSN: [id number]  Arrival date & time 01/19/13  [id number]   First MD Initiated Contact with Patient 01/19/13 [id number]      Chief Complaint  Patient presents with  . Back Pain    (Consider location/radiation/quality/duration/timing/severity/associated sxs/prior treatment) HPI Comments: Pt with hx of renal transplant on chronic steroids and prograf, HTN, HL comes in with cc of right hip pain. Pt has right sided hip pain - mostly on the posteriori aspect. The pain started 3 weeks back. He was seen in the ER for similar pain 2 weeks back, and was sent home with percocet. He ran out of his meds, pain returned, and he comes back to the ER. With this he has some numbness, tingling - but the pain is his main concern. Pt denies any associated numbness, weakness, urinary incontinence, urinary retention, bowel incontinence, saddle anesthesia. Pt has been limping and using cane. The pain is sharp, and is worse at night, and when he walks. He has no hx of hip injuries. Pt has a renal appt coming up soon, and also has an Orthopedic and PCP - however, he has not seen any of those providers for this pain.   Patient is a 52 y.o. male presenting with back pain. The history is provided by the patient.  Back Pain Associated symptoms: numbness   Associated symptoms: no abdominal pain, no chest pain and no dysuria     Past Medical History  Diagnosis Date  . Hypertension   . Hyperlipidemia   . Kidney disease     s/p kidney transplant 2011    Past Surgical History  Procedure Laterality Date  . Kidney transplant  2011  . Hd access procedures      Family History  Problem Relation Age of Onset  . Hypertension Father   . Diabetes Mother     History  Substance Use Topics  . Smoking status: Current Every Day Smoker -- 1.00 packs/day  . Smokeless tobacco: Not on file  . Alcohol Use: Yes      Review of Systems  Constitutional: Negative for activity change and appetite change.  Respiratory:  Negative for cough and shortness of breath.   Cardiovascular: Negative for chest pain.  Gastrointestinal: Negative for abdominal pain.  Genitourinary: Negative for dysuria.  Musculoskeletal: Positive for back pain, arthralgias and gait problem.  Neurological: Positive for numbness. Negative for dizziness.    Allergies  Shellfish allergy and Tomato  Home Medications   Current Outpatient Rx  Name  Route  Sig  Dispense  Refill  . acetaminophen (TYLENOL) 325 MG tablet   Oral   Take 650 mg by mouth every 6 (six) hours as needed for pain.         Marland Kitchen ALPRAZolam (XANAX) 1 MG tablet   Oral   Take 1 mg by mouth daily as needed. For anxiety.         Marland Kitchen amLODipine (NORVASC) 10 MG tablet   Oral   Take 5 mg by mouth every evening.          Marland Kitchen aspirin EC 81 MG tablet   Oral   Take 81 mg by mouth daily.         . budesonide (PULMICORT) 180 MCG/ACT inhaler   Inhalation   Inhale 2 puffs into the lungs 2 (two) times daily.         . carvedilol (COREG) 25 MG tablet   Oral   Take 25 mg by mouth 2 (two) times  daily with a meal.         . colchicine 0.6 MG tablet   Oral   Take 0.6 mg by mouth daily as needed. For gout         . cyclobenzaprine (FLEXERIL) 10 MG tablet   Oral   Take 1 tablet (10 mg total) by mouth 2 (two) times daily as needed for muscle spasms.   10 tablet   0   . gabapentin (NEURONTIN) 100 MG capsule   Oral   Take 100 mg by mouth 2 (two) times daily.         Marland Kitchen lisinopril (PRINIVIL,ZESTRIL) 10 MG tablet   Oral   Take 10 mg by mouth daily.         . magnesium oxide (MAG-OX) 400 MG tablet   Oral   Take 400 mg by mouth every evening.          . mycophenolate (MYFORTIC) 360 MG TBEC   Oral   Take 720 mg by mouth 2 (two) times daily.         Marland Kitchen omeprazole (PRILOSEC) 20 MG capsule   Oral   Take 20 mg by mouth daily.         Marland Kitchen oxyCODONE-acetaminophen (PERCOCET/ROXICET) 5-325 MG per tablet   Oral   Take 1 tablet by mouth every 4 (four) hours  as needed for pain.   12 tablet   0   . pioglitazone (ACTOS) 15 MG tablet   Oral   Take 15 mg by mouth daily.         . predniSONE (DELTASONE) 5 MG tablet   Oral   Take 5 mg by mouth every morning.          . simvastatin (ZOCOR) 40 MG tablet   Oral   Take 40 mg by mouth every Monday, Wednesday, and Friday.          . sodium bicarbonate 650 MG tablet   Oral   Take 1,300 mg by mouth 2 (two) times daily.         . tacrolimus (PROGRAF) 1 MG capsule   Oral   Take 3-4 mg by mouth 2 (two) times daily. Take 4 mg every AM and 3 mg every PM         . vitamin E 400 UNIT capsule   Oral   Take 800 Units by mouth daily.           BP 132/75  Pulse 69  Temp(Src) 97.6 F (36.4 C) (Oral)  Resp 20  Ht 5\' 8"  (1.727 m)  Wt 182 lb (82.555 kg)  BMI 27.68 kg/m2  SpO2 99%  Physical Exam  Nursing note and vitals reviewed. Constitutional: He is oriented to person, place, and time. He appears well-developed.  HENT:  Head: Normocephalic and atraumatic.  Eyes: Conjunctivae and EOM are normal. Pupils are equal, round, and reactive to light.  Neck: Normal range of motion. Neck supple.  Cardiovascular: Normal rate and regular rhythm.   Pulmonary/Chest: Effort normal and breath sounds normal.  Abdominal: Soft. Bowel sounds are normal. He exhibits no distension. There is no tenderness. There is no rebound and no guarding.  Musculoskeletal:  Pt has no tenderness over the lumbar region No step offs, no erythema. Pt has 2+ patellar reflex bilaterally. Able to discriminate between sharp and dull. Able to ambulate - with a limp  Pt has mild tenderness with palpation of the right hip. The passive straight leg raise is negative, active straight  leg reproduces the tenderness. internal and external rotation of the hip is non tender.   Neurological: He is alert and oriented to person, place, and time.  Skin: Skin is warm.    ED Course  Procedures (including critical care time)  Labs  Reviewed - No data to display No results found.   No diagnosis found.    MDM  Pt seen in the ED with cc of hip pain. Pt has hx of renal transplant, has been on chronic steroids - which puts him at risk for pathologic fractures, avascular necrosis AND he is on immunosuppressants, which can put him at risk for septic joint.  Hip exam is overall quite benign  - though he certainly has tenderness with any active/dynamic use of thie hip. Based on the presentation, septic joint is very unlikely. Will get screening labs, including esr and also a hip xray.  Pt explained the limitation of workup in the ED, and i highlighted the important of PCP f/u or Ortho f/u for this matter - as he might need MRI.  Pt understands.     Varney Biles, MD 01/19/13 [phone]

## 2013-01-20 DIAGNOSIS — R319 Hematuria, unspecified: Secondary | ICD-10-CM | POA: Diagnosis not present

## 2013-01-20 DIAGNOSIS — Z940 Kidney transplant status: Secondary | ICD-10-CM | POA: Diagnosis not present

## 2013-01-20 LAB — TACROLIMUS LEVEL: Tacrolimus (FK506) - LabCorp: 5 ng/mL

## 2013-01-21 ENCOUNTER — Other Ambulatory Visit: Payer: Self-pay | Admitting: Orthopedic Surgery

## 2013-01-21 DIAGNOSIS — M25551 Pain in right hip: Secondary | ICD-10-CM

## 2013-01-21 DIAGNOSIS — I739 Peripheral vascular disease, unspecified: Secondary | ICD-10-CM

## 2013-01-21 DIAGNOSIS — M79609 Pain in unspecified limb: Secondary | ICD-10-CM | POA: Diagnosis not present

## 2013-01-21 DIAGNOSIS — M25552 Pain in left hip: Secondary | ICD-10-CM

## 2013-01-21 DIAGNOSIS — M5410 Radiculopathy, site unspecified: Secondary | ICD-10-CM

## 2013-01-21 DIAGNOSIS — IMO0002 Reserved for concepts with insufficient information to code with codable children: Secondary | ICD-10-CM | POA: Diagnosis not present

## 2013-01-25 ENCOUNTER — Other Ambulatory Visit: Payer: Medicaid Other

## 2013-01-26 ENCOUNTER — Other Ambulatory Visit: Payer: Medicaid Other

## 2013-01-28 ENCOUNTER — Ambulatory Visit
Admission: RE | Admit: 2013-01-28 | Discharge: 2013-01-28 | Disposition: A | Discharge status: Home / Self Care | Payer: Medicare Other | Source: Ambulatory Visit | Attending: Orthopedic Surgery | Admitting: Orthopedic Surgery

## 2013-01-28 DIAGNOSIS — I739 Peripheral vascular disease, unspecified: Secondary | ICD-10-CM

## 2013-01-28 DIAGNOSIS — M5126 Other intervertebral disc displacement, lumbar region: Secondary | ICD-10-CM | POA: Diagnosis not present

## 2013-01-28 DIAGNOSIS — M47817 Spondylosis without myelopathy or radiculopathy, lumbosacral region: Secondary | ICD-10-CM | POA: Diagnosis not present

## 2013-01-28 DIAGNOSIS — M25551 Pain in right hip: Secondary | ICD-10-CM

## 2013-01-28 DIAGNOSIS — M25552 Pain in left hip: Secondary | ICD-10-CM

## 2013-01-28 DIAGNOSIS — M5410 Radiculopathy, site unspecified: Secondary | ICD-10-CM

## 2013-01-29 ENCOUNTER — Other Ambulatory Visit: Payer: Self-pay | Admitting: Orthopedic Surgery

## 2013-01-29 ENCOUNTER — Ambulatory Visit
Admission: RE | Admit: 2013-01-29 | Discharge: 2013-01-29 | Disposition: A | Discharge status: Home / Self Care | Payer: Medicare Other | Source: Ambulatory Visit | Attending: Orthopedic Surgery | Admitting: Orthopedic Surgery

## 2013-01-29 DIAGNOSIS — I739 Peripheral vascular disease, unspecified: Secondary | ICD-10-CM

## 2013-01-29 DIAGNOSIS — M5126 Other intervertebral disc displacement, lumbar region: Secondary | ICD-10-CM | POA: Diagnosis not present

## 2013-01-29 DIAGNOSIS — M25551 Pain in right hip: Secondary | ICD-10-CM

## 2013-01-29 DIAGNOSIS — M5410 Radiculopathy, site unspecified: Secondary | ICD-10-CM

## 2013-01-29 MED ORDER — IOHEXOL 180 MG/ML  SOLN
1.0000 mL | Freq: Once | INTRAMUSCULAR | Status: AC | PRN
  Administered 2013-01-29: 1 mL via EPIDURAL

## 2013-01-29 MED ORDER — METHYLPREDNISOLONE ACETATE 40 MG/ML INJ SUSP (RADIOLOG
120.0000 mg | Freq: Once | INTRAMUSCULAR | Status: AC
  Administered 2013-01-29: 120 mg via EPIDURAL

## 2013-03-02 DIAGNOSIS — Z79899 Other long term (current) drug therapy: Secondary | ICD-10-CM | POA: Diagnosis not present

## 2013-03-02 DIAGNOSIS — N2581 Secondary hyperparathyroidism of renal origin: Secondary | ICD-10-CM | POA: Diagnosis not present

## 2013-03-02 DIAGNOSIS — Z940 Kidney transplant status: Secondary | ICD-10-CM | POA: Diagnosis not present

## 2013-03-02 DIAGNOSIS — N390 Urinary tract infection, site not specified: Secondary | ICD-10-CM | POA: Diagnosis not present

## 2013-03-02 DIAGNOSIS — D649 Anemia, unspecified: Secondary | ICD-10-CM | POA: Diagnosis not present

## 2013-03-09 DIAGNOSIS — IMO0002 Reserved for concepts with insufficient information to code with codable children: Secondary | ICD-10-CM | POA: Diagnosis not present

## 2013-03-09 DIAGNOSIS — M79609 Pain in unspecified limb: Secondary | ICD-10-CM | POA: Diagnosis not present

## 2013-03-10 ENCOUNTER — Emergency Department (HOSPITAL_COMMUNITY)
Admission: EM | Admit: 2013-03-10 | Discharge: 2013-03-10 | Disposition: A | Discharge status: Home / Self Care | Payer: Medicare Other | Attending: Emergency Medicine | Admitting: Emergency Medicine

## 2013-03-10 ENCOUNTER — Encounter (HOSPITAL_COMMUNITY): Payer: Self-pay | Admitting: *Deleted

## 2013-03-10 DIAGNOSIS — Z7982 Long term (current) use of aspirin: Secondary | ICD-10-CM | POA: Insufficient documentation

## 2013-03-10 DIAGNOSIS — I10 Essential (primary) hypertension: Secondary | ICD-10-CM | POA: Diagnosis not present

## 2013-03-10 DIAGNOSIS — Z87448 Personal history of other diseases of urinary system: Secondary | ICD-10-CM | POA: Insufficient documentation

## 2013-03-10 DIAGNOSIS — M25559 Pain in unspecified hip: Secondary | ICD-10-CM | POA: Diagnosis not present

## 2013-03-10 DIAGNOSIS — F17200 Nicotine dependence, unspecified, uncomplicated: Secondary | ICD-10-CM | POA: Diagnosis not present

## 2013-03-10 DIAGNOSIS — R209 Unspecified disturbances of skin sensation: Secondary | ICD-10-CM | POA: Insufficient documentation

## 2013-03-10 DIAGNOSIS — E785 Hyperlipidemia, unspecified: Secondary | ICD-10-CM | POA: Diagnosis not present

## 2013-03-10 DIAGNOSIS — M545 Low back pain, unspecified: Secondary | ICD-10-CM

## 2013-03-10 DIAGNOSIS — Z79899 Other long term (current) drug therapy: Secondary | ICD-10-CM | POA: Insufficient documentation

## 2013-03-10 MED ORDER — OXYCODONE-ACETAMINOPHEN 5-325 MG PO TABS: 1.0000 | ORAL_TABLET | Freq: Four times a day (QID) | ORAL | Status: DC | PRN

## 2013-03-10 NOTE — ED Notes (Signed)
rx x 1 given for percocet- d/c with family

## 2013-03-10 NOTE — ED Notes (Signed)
PA at bedside.

## 2013-03-10 NOTE — ED Notes (Signed)
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 Pt states that he saw the Orthopedic yesterday.

## 2013-03-12 NOTE — ED Provider Notes (Signed)
History     CSN: [id number]  Arrival date & time 03/10/13  1909   First MD Initiated Contact with Patient 03/10/13 2008      Chief Complaint  Patient presents with  . Back Pain  . Hip Pain    (Consider location/radiation/quality/duration/timing/severity/associated sxs/prior treatment) HPI Comments: Patient presents with chronic lower back pain.  He has been seen by Orthopedics recently and states that he has been referred to a Spine Specialist.  He reports that the pain that he has today is similar to the pain that he has had in the past.  Denies injury or trauma.  He had a MRI of his lumbar spine performed on 01/28/13, which showed Degenerative changes and a large right posterior lateral cephalad extending extruded disc fragment compressing the right aspect of the thecal sac.   Patient is a 52 y.o. male presenting with back pain. The history is provided by the patient.  Back Pain Location:  Lumbar spine Radiates to: right hip and down the right leg. Onset quality:  Gradual Timing:  Constant Progression:  Worsening Chronicity:  Chronic Relieved by:  Nothing Worsened by:  Bending Associated symptoms: numbness and tingling   Associated symptoms: no abdominal pain, no bladder incontinence, no bowel incontinence, no dysuria, no fever and no weight loss     Past Medical History  Diagnosis Date  . Hypertension   . Hyperlipidemia   . Kidney disease     s/p kidney transplant 2011    Past Surgical History  Procedure Laterality Date  . Kidney transplant  2011  . Hd access procedures      Family History  Problem Relation Age of Onset  . Hypertension Father   . Diabetes Mother     History  Substance Use Topics  . Smoking status: Current Every Day Smoker -- 0.50 packs/day    Types: Cigarettes  . Smokeless tobacco: Not on file  . Alcohol Use: Yes      Review of Systems  Constitutional: Negative for fever, chills and weight loss.  Gastrointestinal: Negative for  abdominal pain and bowel incontinence.  Genitourinary: Negative for bladder incontinence and dysuria.  Musculoskeletal: Positive for back pain.  Neurological: Positive for tingling and numbness.    Allergies  Shellfish allergy and Tomato  Home Medications   Current Outpatient Rx  Name  Route  Sig  Dispense  Refill  . acetaminophen (TYLENOL) 325 MG tablet   Oral   Take 650 mg by mouth every 6 (six) hours as needed for pain.         Marland Kitchen ALPRAZolam (XANAX) 1 MG tablet   Oral   Take 1 mg by mouth daily as needed. For anxiety.         Marland Kitchen amLODipine (NORVASC) 10 MG tablet   Oral   Take 5 mg by mouth every evening.          Marland Kitchen aspirin EC 81 MG tablet   Oral   Take 81 mg by mouth daily.         . budesonide (PULMICORT) 180 MCG/ACT inhaler   Inhalation   Inhale 2 puffs into the lungs 2 (two) times daily.         . carvedilol (COREG) 25 MG tablet   Oral   Take 25 mg by mouth 2 (two) times daily with a meal.         . colchicine 0.6 MG tablet   Oral   Take 0.6 mg by mouth daily as needed.  For gout         . gabapentin (NEURONTIN) 100 MG capsule   Oral   Take 100 mg by mouth 2 (two) times daily.         Marland Kitchen lisinopril (PRINIVIL,ZESTRIL) 10 MG tablet   Oral   Take 10 mg by mouth daily.         . magnesium oxide (MAG-OX) 400 MG tablet   Oral   Take 400 mg by mouth every evening.          . mycophenolate (MYFORTIC) 360 MG TBEC   Oral   Take 720 mg by mouth 2 (two) times daily.         Marland Kitchen omeprazole (PRILOSEC) 20 MG capsule   Oral   Take 20 mg by mouth daily.         . pioglitazone (ACTOS) 15 MG tablet   Oral   Take 15 mg by mouth daily.         . predniSONE (DELTASONE) 5 MG tablet   Oral   Take 5 mg by mouth every morning.          . simvastatin (ZOCOR) 40 MG tablet   Oral   Take 40 mg by mouth every Monday, Wednesday, and Friday.          . sodium bicarbonate 650 MG tablet   Oral   Take 1,300 mg by mouth 2 (two) times daily.          . tacrolimus (PROGRAF) 1 MG capsule   Oral   Take 3-4 mg by mouth 2 (two) times daily. Take 4 mg every AM and 3 mg every PM         . vitamin E 400 UNIT capsule   Oral   Take 800 Units by mouth daily.         Marland Kitchen oxyCODONE-acetaminophen (PERCOCET/ROXICET) 5-325 MG per tablet   Oral   Take 1-2 tablets by mouth every 6 (six) hours as needed for pain.   20 tablet   0     BP 130/74  Pulse 70  Temp(Src) 98.7 F (37.1 C) (Oral)  Resp 18  Ht 5\' 8"  (1.727 m)  Wt 175 lb (79.379 kg)  BMI 26.61 kg/m2  SpO2 98%  Physical Exam  Nursing note and vitals reviewed. Constitutional: He appears well-developed and well-nourished.  HENT:  Head: Normocephalic and atraumatic.  Neck: Normal range of motion. Neck supple.  Cardiovascular: Normal rate, regular rhythm and normal heart sounds.   Pulmonary/Chest: Effort normal and breath sounds normal.  Musculoskeletal: Normal range of motion.       Right hip: He exhibits normal range of motion, no tenderness, no bony tenderness, no swelling and no deformity.       Cervical back: He exhibits normal range of motion, no tenderness, no bony tenderness, no swelling, no edema and no deformity.       Thoracic back: He exhibits normal range of motion, no tenderness, no bony tenderness, no swelling, no edema and no deformity.       Lumbar back: He exhibits tenderness and bony tenderness. He exhibits no swelling, no edema and no deformity.  Neurological: He is alert. He has normal strength. No sensory deficit.  Reflex Scores:      Patellar reflexes are 2+ on the right side and 2+ on the left side.      Achilles reflexes are 2+ on the right side and 2+ on the left side. Skin: Skin is warm and  dry. No erythema.  Psychiatric: He has a normal mood and affect.    ED Course  Procedures (including critical care time)  Labs Reviewed - No data to display No results found.   1. Lower back pain       MDM  Patient with chronic back pain.  Has been  seen by Orthopedics who have referred him to spine specialist.  MRI of lumbar spine performed on 01/28/13, which showed Degenerative changes and large right  posterior lateral cephalad extending extruded disc fragment compressing the right aspect of the thecal sac. Patient can walk but states is painful.  No loss of bowel or bladder control.  No concern for cauda equina.  No fever, night sweats, weight loss, h/o cancer, IVDU.  RICE protocol and pain medicine indicated and discussed with patient.         Sherlyn Lees [place], PA-C 03/12/13 [phone]

## 2013-03-12 NOTE — ED Provider Notes (Signed)
Medical screening examination/treatment/procedure(s) were performed by non-physician practitioner and as supervising physician I was immediately available for consultation/collaboration.  Babette Relic, MD 03/12/13 2157

## 2013-03-25 DIAGNOSIS — M79609 Pain in unspecified limb: Secondary | ICD-10-CM | POA: Diagnosis not present

## 2013-03-25 DIAGNOSIS — B351 Tinea unguium: Secondary | ICD-10-CM | POA: Diagnosis not present

## 2013-04-22 DIAGNOSIS — Z79899 Other long term (current) drug therapy: Secondary | ICD-10-CM | POA: Diagnosis not present

## 2013-04-22 DIAGNOSIS — Z940 Kidney transplant status: Secondary | ICD-10-CM | POA: Diagnosis not present

## 2013-04-22 DIAGNOSIS — N2581 Secondary hyperparathyroidism of renal origin: Secondary | ICD-10-CM | POA: Diagnosis not present

## 2013-04-22 DIAGNOSIS — D649 Anemia, unspecified: Secondary | ICD-10-CM | POA: Diagnosis not present

## 2013-04-23 DIAGNOSIS — N185 Chronic kidney disease, stage 5: Secondary | ICD-10-CM | POA: Diagnosis not present

## 2013-04-23 DIAGNOSIS — N2581 Secondary hyperparathyroidism of renal origin: Secondary | ICD-10-CM | POA: Diagnosis not present

## 2013-04-23 DIAGNOSIS — Z125 Encounter for screening for malignant neoplasm of prostate: Secondary | ICD-10-CM | POA: Diagnosis not present

## 2013-04-23 DIAGNOSIS — D649 Anemia, unspecified: Secondary | ICD-10-CM | POA: Diagnosis not present

## 2013-05-08 DIAGNOSIS — M5137 Other intervertebral disc degeneration, lumbosacral region: Secondary | ICD-10-CM | POA: Diagnosis not present

## 2013-05-08 DIAGNOSIS — M25559 Pain in unspecified hip: Secondary | ICD-10-CM | POA: Diagnosis not present

## 2013-05-15 DIAGNOSIS — M5137 Other intervertebral disc degeneration, lumbosacral region: Secondary | ICD-10-CM | POA: Diagnosis not present

## 2013-05-18 DIAGNOSIS — M109 Gout, unspecified: Secondary | ICD-10-CM | POA: Diagnosis not present

## 2013-05-18 DIAGNOSIS — N2581 Secondary hyperparathyroidism of renal origin: Secondary | ICD-10-CM | POA: Diagnosis not present

## 2013-05-18 DIAGNOSIS — Z940 Kidney transplant status: Secondary | ICD-10-CM | POA: Diagnosis not present

## 2013-05-18 DIAGNOSIS — G894 Chronic pain syndrome: Secondary | ICD-10-CM | POA: Diagnosis not present

## 2013-05-18 DIAGNOSIS — D649 Anemia, unspecified: Secondary | ICD-10-CM | POA: Diagnosis not present

## 2013-05-18 DIAGNOSIS — Z79899 Other long term (current) drug therapy: Secondary | ICD-10-CM | POA: Diagnosis not present

## 2013-05-18 DIAGNOSIS — N189 Chronic kidney disease, unspecified: Secondary | ICD-10-CM | POA: Diagnosis not present

## 2013-05-29 DIAGNOSIS — M5137 Other intervertebral disc degeneration, lumbosacral region: Secondary | ICD-10-CM | POA: Diagnosis not present

## 2013-06-02 DIAGNOSIS — G894 Chronic pain syndrome: Secondary | ICD-10-CM | POA: Diagnosis not present

## 2013-06-04 ENCOUNTER — Encounter (INDEPENDENT_AMBULATORY_CARE_PROVIDER_SITE_OTHER): Payer: Self-pay | Admitting: Surgery

## 2013-06-15 ENCOUNTER — Ambulatory Visit (INDEPENDENT_AMBULATORY_CARE_PROVIDER_SITE_OTHER): Payer: Self-pay | Admitting: Surgery

## 2013-06-18 DIAGNOSIS — E119 Type 2 diabetes mellitus without complications: Secondary | ICD-10-CM | POA: Diagnosis not present

## 2013-06-18 DIAGNOSIS — N183 Chronic kidney disease, stage 3 unspecified: Secondary | ICD-10-CM | POA: Diagnosis not present

## 2013-06-18 DIAGNOSIS — I10 Essential (primary) hypertension: Secondary | ICD-10-CM | POA: Diagnosis not present

## 2013-06-18 DIAGNOSIS — E785 Hyperlipidemia, unspecified: Secondary | ICD-10-CM | POA: Diagnosis not present

## 2013-06-18 DIAGNOSIS — Z23 Encounter for immunization: Secondary | ICD-10-CM | POA: Diagnosis not present

## 2013-06-24 ENCOUNTER — Ambulatory Visit (INDEPENDENT_AMBULATORY_CARE_PROVIDER_SITE_OTHER): Payer: Medicare Other | Admitting: Surgery

## 2013-06-24 ENCOUNTER — Encounter (INDEPENDENT_AMBULATORY_CARE_PROVIDER_SITE_OTHER): Payer: Self-pay | Admitting: Surgery

## 2013-06-24 VITALS — BP 122/68 | HR 72 | Temp 97.2°F | Resp 16 | Ht 68.0 in | Wt 177.2 lb

## 2013-06-24 DIAGNOSIS — R1032 Left lower quadrant pain: Secondary | ICD-10-CM | POA: Insufficient documentation

## 2013-06-24 NOTE — Progress Notes (Signed)
Subjective:     Patient ID: Theodore Arellano, male   DOB: [birth year], 52 y.o.   MRN: [id number]  HPI This gentleman is referred by Dr. Melina Schools for evaluation of a possible left inguinal hernia. The patient had a previous repair many years ago a left side and also had an incision down in the left lower quadrant for kidney transplant. He has had pain for many years. He has not noticed a bulge. The pain does not appear to be coming from his back. He has had no nausea or vomiting or obstructive symptoms.  Review of Systems     Objective:   Physical Exam On exam, his abdomen is soft and nontender. There is a well-healed incision in the left groin. I can palpate the kidney was transplanted. Within both lying and standing, I cannot elicit a incisional hernia or inguinal hernia defect    Assessment:     Chronic left groin pain     Plan:     This certainly may just be from scar tissue or from the transplanted kidney. Again, I cannot feel a hernia and unfortunately have nothing I can offer him at this time regarding his pain. I will see him as needed

## 2013-06-30 ENCOUNTER — Encounter (INDEPENDENT_AMBULATORY_CARE_PROVIDER_SITE_OTHER): Payer: Self-pay

## 2013-07-02 DIAGNOSIS — G894 Chronic pain syndrome: Secondary | ICD-10-CM | POA: Diagnosis not present

## 2013-07-07 DIAGNOSIS — Z48298 Encounter for aftercare following other organ transplant: Secondary | ICD-10-CM | POA: Diagnosis not present

## 2013-07-07 DIAGNOSIS — Z940 Kidney transplant status: Secondary | ICD-10-CM | POA: Diagnosis not present

## 2013-07-08 DIAGNOSIS — Z48298 Encounter for aftercare following other organ transplant: Secondary | ICD-10-CM | POA: Diagnosis not present

## 2013-07-08 DIAGNOSIS — Z9225 Personal history of immunosupression therapy: Secondary | ICD-10-CM | POA: Diagnosis not present

## 2013-07-08 DIAGNOSIS — Z79899 Other long term (current) drug therapy: Secondary | ICD-10-CM | POA: Diagnosis not present

## 2013-07-08 DIAGNOSIS — I10 Essential (primary) hypertension: Secondary | ICD-10-CM | POA: Diagnosis not present

## 2013-07-08 DIAGNOSIS — E872 Acidosis: Secondary | ICD-10-CM | POA: Diagnosis not present

## 2013-07-08 DIAGNOSIS — Z940 Kidney transplant status: Secondary | ICD-10-CM | POA: Diagnosis not present

## 2013-07-13 DIAGNOSIS — Z48298 Encounter for aftercare following other organ transplant: Secondary | ICD-10-CM | POA: Diagnosis not present

## 2013-07-13 DIAGNOSIS — Z940 Kidney transplant status: Secondary | ICD-10-CM | POA: Diagnosis not present

## 2013-07-13 DIAGNOSIS — N1330 Unspecified hydronephrosis: Secondary | ICD-10-CM | POA: Diagnosis not present

## 2013-07-15 DIAGNOSIS — R079 Chest pain, unspecified: Secondary | ICD-10-CM | POA: Diagnosis not present

## 2013-07-15 DIAGNOSIS — R0609 Other forms of dyspnea: Secondary | ICD-10-CM | POA: Diagnosis not present

## 2013-07-15 DIAGNOSIS — R5381 Other malaise: Secondary | ICD-10-CM | POA: Diagnosis not present

## 2013-07-15 DIAGNOSIS — F17200 Nicotine dependence, unspecified, uncomplicated: Secondary | ICD-10-CM | POA: Diagnosis not present

## 2013-07-16 ENCOUNTER — Encounter (HOSPITAL_COMMUNITY): Admission: RE | Payer: Self-pay | Source: Ambulatory Visit

## 2013-07-16 ENCOUNTER — Inpatient Hospital Stay (HOSPITAL_COMMUNITY): Admission: RE | Admit: 2013-07-16 | Payer: Medicare Other | Source: Ambulatory Visit | Admitting: Orthopedic Surgery

## 2013-07-16 SURGERY — LUMBAR LAMINECTOMY/DECOMPRESSION MICRODISCECTOMY 2 LEVELS
Anesthesia: General

## 2013-08-10 DIAGNOSIS — Z940 Kidney transplant status: Secondary | ICD-10-CM | POA: Diagnosis not present

## 2013-08-10 DIAGNOSIS — D649 Anemia, unspecified: Secondary | ICD-10-CM | POA: Diagnosis not present

## 2013-08-10 DIAGNOSIS — I2510 Atherosclerotic heart disease of native coronary artery without angina pectoris: Secondary | ICD-10-CM | POA: Diagnosis not present

## 2013-08-10 DIAGNOSIS — Z48298 Encounter for aftercare following other organ transplant: Secondary | ICD-10-CM | POA: Diagnosis not present

## 2013-08-10 DIAGNOSIS — I10 Essential (primary) hypertension: Secondary | ICD-10-CM | POA: Diagnosis not present

## 2013-08-11 DIAGNOSIS — G894 Chronic pain syndrome: Secondary | ICD-10-CM | POA: Diagnosis not present

## 2013-09-08 DIAGNOSIS — Z940 Kidney transplant status: Secondary | ICD-10-CM | POA: Diagnosis not present

## 2013-09-08 DIAGNOSIS — Z48298 Encounter for aftercare following other organ transplant: Secondary | ICD-10-CM | POA: Diagnosis not present

## 2013-09-09 DIAGNOSIS — Z940 Kidney transplant status: Secondary | ICD-10-CM | POA: Diagnosis not present

## 2013-09-09 DIAGNOSIS — Z48298 Encounter for aftercare following other organ transplant: Secondary | ICD-10-CM | POA: Diagnosis not present

## 2013-09-09 DIAGNOSIS — B349 Viral infection, unspecified: Secondary | ICD-10-CM | POA: Diagnosis not present

## 2013-09-10 DIAGNOSIS — G894 Chronic pain syndrome: Secondary | ICD-10-CM | POA: Diagnosis not present

## 2013-09-10 DIAGNOSIS — Z79899 Other long term (current) drug therapy: Secondary | ICD-10-CM | POA: Diagnosis not present

## 2013-09-28 DIAGNOSIS — M545 Low back pain, unspecified: Secondary | ICD-10-CM | POA: Diagnosis not present

## 2013-09-28 DIAGNOSIS — Z79899 Other long term (current) drug therapy: Secondary | ICD-10-CM | POA: Diagnosis not present

## 2013-09-28 DIAGNOSIS — Z5181 Encounter for therapeutic drug level monitoring: Secondary | ICD-10-CM | POA: Diagnosis not present

## 2013-09-28 DIAGNOSIS — M5137 Other intervertebral disc degeneration, lumbosacral region: Secondary | ICD-10-CM | POA: Diagnosis not present

## 2013-10-12 DIAGNOSIS — Z79899 Other long term (current) drug therapy: Secondary | ICD-10-CM | POA: Diagnosis not present

## 2013-10-12 DIAGNOSIS — G894 Chronic pain syndrome: Secondary | ICD-10-CM | POA: Diagnosis not present

## 2013-10-26 DIAGNOSIS — R0989 Other specified symptoms and signs involving the circulatory and respiratory systems: Secondary | ICD-10-CM | POA: Diagnosis not present

## 2013-10-26 DIAGNOSIS — R5383 Other fatigue: Secondary | ICD-10-CM | POA: Diagnosis not present

## 2013-10-26 DIAGNOSIS — F17200 Nicotine dependence, unspecified, uncomplicated: Secondary | ICD-10-CM | POA: Diagnosis not present

## 2013-10-26 DIAGNOSIS — R5381 Other malaise: Secondary | ICD-10-CM | POA: Diagnosis not present

## 2013-10-26 DIAGNOSIS — R0609 Other forms of dyspnea: Secondary | ICD-10-CM | POA: Diagnosis not present

## 2013-10-27 DIAGNOSIS — N400 Enlarged prostate without lower urinary tract symptoms: Secondary | ICD-10-CM | POA: Diagnosis not present

## 2013-10-27 DIAGNOSIS — D631 Anemia in chronic kidney disease: Secondary | ICD-10-CM | POA: Diagnosis not present

## 2013-10-27 DIAGNOSIS — E785 Hyperlipidemia, unspecified: Secondary | ICD-10-CM | POA: Diagnosis not present

## 2013-10-27 DIAGNOSIS — E1129 Type 2 diabetes mellitus with other diabetic kidney complication: Secondary | ICD-10-CM | POA: Diagnosis not present

## 2013-10-27 DIAGNOSIS — N185 Chronic kidney disease, stage 5: Secondary | ICD-10-CM | POA: Diagnosis not present

## 2013-10-27 DIAGNOSIS — Z940 Kidney transplant status: Secondary | ICD-10-CM | POA: Diagnosis not present

## 2013-10-27 DIAGNOSIS — N2581 Secondary hyperparathyroidism of renal origin: Secondary | ICD-10-CM | POA: Diagnosis not present

## 2013-11-12 DIAGNOSIS — Z79899 Other long term (current) drug therapy: Secondary | ICD-10-CM | POA: Diagnosis not present

## 2013-11-12 DIAGNOSIS — G894 Chronic pain syndrome: Secondary | ICD-10-CM | POA: Diagnosis not present

## 2013-11-25 DIAGNOSIS — Z1211 Encounter for screening for malignant neoplasm of colon: Secondary | ICD-10-CM | POA: Diagnosis not present

## 2013-11-25 DIAGNOSIS — K219 Gastro-esophageal reflux disease without esophagitis: Secondary | ICD-10-CM | POA: Diagnosis not present

## 2013-11-25 DIAGNOSIS — K5900 Constipation, unspecified: Secondary | ICD-10-CM | POA: Diagnosis not present

## 2013-12-03 DIAGNOSIS — Z79899 Other long term (current) drug therapy: Secondary | ICD-10-CM | POA: Diagnosis not present

## 2013-12-03 DIAGNOSIS — Z1211 Encounter for screening for malignant neoplasm of colon: Secondary | ICD-10-CM | POA: Diagnosis not present

## 2013-12-03 DIAGNOSIS — K209 Esophagitis, unspecified without bleeding: Secondary | ICD-10-CM | POA: Diagnosis not present

## 2013-12-03 DIAGNOSIS — D129 Benign neoplasm of anus and anal canal: Secondary | ICD-10-CM | POA: Diagnosis not present

## 2013-12-03 DIAGNOSIS — F17200 Nicotine dependence, unspecified, uncomplicated: Secondary | ICD-10-CM | POA: Diagnosis not present

## 2013-12-03 DIAGNOSIS — K3189 Other diseases of stomach and duodenum: Secondary | ICD-10-CM | POA: Diagnosis not present

## 2013-12-03 DIAGNOSIS — I10 Essential (primary) hypertension: Secondary | ICD-10-CM | POA: Diagnosis not present

## 2013-12-03 DIAGNOSIS — Z940 Kidney transplant status: Secondary | ICD-10-CM | POA: Diagnosis not present

## 2013-12-03 DIAGNOSIS — R1013 Epigastric pain: Secondary | ICD-10-CM | POA: Diagnosis not present

## 2013-12-03 DIAGNOSIS — D128 Benign neoplasm of rectum: Secondary | ICD-10-CM | POA: Diagnosis not present

## 2013-12-03 DIAGNOSIS — K2289 Other specified disease of esophagus: Secondary | ICD-10-CM | POA: Diagnosis not present

## 2013-12-03 DIAGNOSIS — K449 Diaphragmatic hernia without obstruction or gangrene: Secondary | ICD-10-CM | POA: Diagnosis not present

## 2013-12-27 DIAGNOSIS — E119 Type 2 diabetes mellitus without complications: Secondary | ICD-10-CM | POA: Diagnosis not present

## 2013-12-27 DIAGNOSIS — R221 Localized swelling, mass and lump, neck: Secondary | ICD-10-CM | POA: Diagnosis not present

## 2013-12-27 DIAGNOSIS — Z992 Dependence on renal dialysis: Secondary | ICD-10-CM | POA: Diagnosis not present

## 2013-12-27 DIAGNOSIS — I10 Essential (primary) hypertension: Secondary | ICD-10-CM | POA: Diagnosis not present

## 2013-12-27 DIAGNOSIS — F17200 Nicotine dependence, unspecified, uncomplicated: Secondary | ICD-10-CM | POA: Diagnosis not present

## 2013-12-27 DIAGNOSIS — I2510 Atherosclerotic heart disease of native coronary artery without angina pectoris: Secondary | ICD-10-CM | POA: Diagnosis not present

## 2013-12-27 DIAGNOSIS — M549 Dorsalgia, unspecified: Secondary | ICD-10-CM | POA: Diagnosis not present

## 2013-12-27 DIAGNOSIS — R220 Localized swelling, mass and lump, head: Secondary | ICD-10-CM | POA: Diagnosis not present

## 2013-12-27 DIAGNOSIS — K219 Gastro-esophageal reflux disease without esophagitis: Secondary | ICD-10-CM | POA: Diagnosis not present

## 2013-12-27 DIAGNOSIS — G8929 Other chronic pain: Secondary | ICD-10-CM | POA: Diagnosis not present

## 2013-12-27 DIAGNOSIS — F411 Generalized anxiety disorder: Secondary | ICD-10-CM | POA: Diagnosis not present

## 2013-12-27 DIAGNOSIS — N289 Disorder of kidney and ureter, unspecified: Secondary | ICD-10-CM | POA: Diagnosis not present

## 2013-12-27 DIAGNOSIS — E785 Hyperlipidemia, unspecified: Secondary | ICD-10-CM | POA: Diagnosis not present

## 2013-12-28 DIAGNOSIS — R911 Solitary pulmonary nodule: Secondary | ICD-10-CM | POA: Diagnosis not present

## 2013-12-28 DIAGNOSIS — E0789 Other specified disorders of thyroid: Secondary | ICD-10-CM | POA: Diagnosis not present

## 2013-12-28 DIAGNOSIS — R220 Localized swelling, mass and lump, head: Secondary | ICD-10-CM | POA: Diagnosis not present

## 2014-01-06 ENCOUNTER — Emergency Department (HOSPITAL_COMMUNITY): Payer: Medicare Other

## 2014-01-06 ENCOUNTER — Encounter (HOSPITAL_COMMUNITY): Payer: Self-pay | Admitting: Emergency Medicine

## 2014-01-06 ENCOUNTER — Emergency Department (HOSPITAL_COMMUNITY)
Admission: EM | Admit: 2014-01-06 | Discharge: 2014-01-07 | Disposition: A | Discharge status: Home / Self Care | Payer: Medicare Other | Attending: Emergency Medicine | Admitting: Emergency Medicine

## 2014-01-06 DIAGNOSIS — F17200 Nicotine dependence, unspecified, uncomplicated: Secondary | ICD-10-CM | POA: Insufficient documentation

## 2014-01-06 DIAGNOSIS — Z7982 Long term (current) use of aspirin: Secondary | ICD-10-CM | POA: Diagnosis not present

## 2014-01-06 DIAGNOSIS — Z87448 Personal history of other diseases of urinary system: Secondary | ICD-10-CM | POA: Insufficient documentation

## 2014-01-06 DIAGNOSIS — M545 Low back pain, unspecified: Secondary | ICD-10-CM | POA: Insufficient documentation

## 2014-01-06 DIAGNOSIS — R059 Cough, unspecified: Secondary | ICD-10-CM | POA: Diagnosis not present

## 2014-01-06 DIAGNOSIS — I10 Essential (primary) hypertension: Secondary | ICD-10-CM | POA: Insufficient documentation

## 2014-01-06 DIAGNOSIS — R062 Wheezing: Secondary | ICD-10-CM | POA: Diagnosis not present

## 2014-01-06 DIAGNOSIS — R05 Cough: Secondary | ICD-10-CM | POA: Insufficient documentation

## 2014-01-06 DIAGNOSIS — E785 Hyperlipidemia, unspecified: Secondary | ICD-10-CM | POA: Insufficient documentation

## 2014-01-06 DIAGNOSIS — J3489 Other specified disorders of nose and nasal sinuses: Secondary | ICD-10-CM | POA: Insufficient documentation

## 2014-01-06 DIAGNOSIS — IMO0002 Reserved for concepts with insufficient information to code with codable children: Secondary | ICD-10-CM | POA: Diagnosis not present

## 2014-01-06 DIAGNOSIS — Z79899 Other long term (current) drug therapy: Secondary | ICD-10-CM | POA: Diagnosis not present

## 2014-01-06 DIAGNOSIS — R079 Chest pain, unspecified: Secondary | ICD-10-CM | POA: Diagnosis not present

## 2014-01-06 NOTE — ED Provider Notes (Signed)
CSN: [id number]     Arrival date & time 01/06/14  2012 History   First MD Initiated Contact with Patient 01/06/14 2335     Chief Complaint  Patient presents with  . Cough  . Back Pain     (Consider location/radiation/quality/duration/timing/severity/associated sxs/prior Treatment) HPI Comments: Patient presents emergency department with chief complaint of cough. He states that he has had the cough for the past 2 days. He denies fevers or chills. He states the cough is productive. He has not taken anything to alleviate his symptoms. There are no aggravating or alleviating factors. He reports associated nasal congestion. Additionally, he complains of low back pain, he states he thinks his pinched nerve. He also states that he has a kidney transplant patient. He denies any dysuria, but states that he is not making as much urine as he used to.  The history is provided by the patient. No language interpreter was used.    Past Medical History  Diagnosis Date  . Hypertension   . Hyperlipidemia   . Kidney disease     s/p kidney transplant 2011   Past Surgical History  Procedure Laterality Date  . Kidney transplant  2011  . Hd access procedures     Family History  Problem Relation Age of Onset  . Hypertension Father   . Diabetes Mother    History  Substance Use Topics  . Smoking status: Current Every Day Smoker -- 0.50 packs/day    Types: Cigarettes  . Smokeless tobacco: Never Used  . Alcohol Use: Yes    Review of Systems  Constitutional: Negative for fever and chills.  Respiratory: Positive for cough and wheezing. Negative for shortness of breath.   Cardiovascular: Negative for chest pain.  Gastrointestinal: Negative for nausea, vomiting, diarrhea and constipation.  Genitourinary: Negative for dysuria.  Musculoskeletal: Positive for back pain.      Allergies  Shellfish allergy and Tomato  Home Medications   Current Outpatient Rx  Name  Route  Sig  Dispense  Refill  .  ALPRAZolam (XANAX) 1 MG tablet   Oral   Take 1 mg by mouth daily as needed. For anxiety.         Marland Kitchen amLODipine (NORVASC) 10 MG tablet   Oral   Take 5 mg by mouth every evening.          Marland Kitchen aspirin EC 81 MG tablet   Oral   Take 81 mg by mouth daily.         . budesonide (PULMICORT) 180 MCG/ACT inhaler   Inhalation   Inhale 2 puffs into the lungs 2 (two) times daily.         . carvedilol (COREG) 25 MG tablet   Oral   Take 25 mg by mouth 2 (two) times daily with a meal.         . colchicine 0.6 MG tablet   Oral   Take 0.6 mg by mouth daily as needed. For gout         . gabapentin (NEURONTIN) 100 MG capsule   Oral   Take 100 mg by mouth 2 (two) times daily.         Marland Kitchen lisinopril (PRINIVIL,ZESTRIL) 10 MG tablet   Oral   Take 10 mg by mouth daily.         . magnesium oxide (MAG-OX) 400 MG tablet   Oral   Take 400 mg by mouth every evening.          . mycophenolate (  MYFORTIC) 360 MG TBEC   Oral   Take 720 mg by mouth 2 (two) times daily.         Marland Kitchen omeprazole (PRILOSEC) 20 MG capsule   Oral   Take 20 mg by mouth daily.         Marland Kitchen oxyCODONE-acetaminophen (PERCOCET/ROXICET) 5-325 MG per tablet   Oral   Take 1-2 tablets by mouth every 6 (six) hours as needed for pain.   20 tablet   0   . pioglitazone (ACTOS) 15 MG tablet   Oral   Take 15 mg by mouth daily.         . predniSONE (DELTASONE) 5 MG tablet   Oral   Take 5 mg by mouth every morning.          . simvastatin (ZOCOR) 40 MG tablet   Oral   Take 40 mg by mouth every Monday, Wednesday, and Friday.          . sodium bicarbonate 650 MG tablet   Oral   Take 1,300 mg by mouth 2 (two) times daily.         . tacrolimus (PROGRAF) 1 MG capsule   Oral   Take 3-4 mg by mouth 2 (two) times daily. Take 4 mg every AM and 3 mg every PM         . traMADol (ULTRAM) 50 MG tablet   Oral   Take 50 mg by mouth every 6 (six) hours as needed for moderate pain.          . vitamin E 400 UNIT  capsule   Oral   Take 800 Units by mouth daily.          BP 143/90  Pulse 72  Temp(Src) 98.4 F (36.9 C) (Oral)  Resp 20  SpO2 97% Physical Exam  Nursing note and vitals reviewed. Constitutional: He is oriented to person, place, and time. He appears well-developed and well-nourished. No distress.  HENT:  Head: Normocephalic and atraumatic.  Eyes: Conjunctivae and EOM are normal. Right eye exhibits no discharge. Left eye exhibits no discharge. No scleral icterus.  Neck: Normal range of motion. Neck supple. No tracheal deviation present.  Cardiovascular: Normal rate, regular rhythm and normal heart sounds.  Exam reveals no gallop and no friction rub.   No murmur heard. Pulmonary/Chest: Effort normal. No respiratory distress. He has no wheezes. He has rales.  Abdominal: Soft. He exhibits no distension. There is no tenderness.  Musculoskeletal: Normal range of motion.  Lumbar paraspinal muscles tender to palpation, no bony tenderness, step-offs, or gross abnormality or deformity of spine, patient is able to ambulate, moves all extremities  Bilateral great toe extension intact Bilateral plantar/dorsiflexion intact  Neurological: He is alert and oriented to person, place, and time. He has normal reflexes.  Sensation and strength intact bilaterally Symmetrical reflexes  Skin: Skin is warm. He is not diaphoretic.  Psychiatric: He has a normal mood and affect. His behavior is normal. Judgment and thought content normal.    ED Course  Procedures (including critical care time) Results for orders placed during the hospital encounter of 01/06/14  CBC WITH DIFFERENTIAL      Result Value Ref Range   WBC 6.4  4.0 - 10.5 K/uL   RBC 4.81  4.22 - 5.81 MIL/uL   Hemoglobin 13.6  13.0 - 17.0 g/dL   HCT 41.0  39.0 - 52.0 %   MCV 85.2  78.0 - 100.0 fL   MCH 28.3  26.0 -  34.0 pg   MCHC 33.2  30.0 - 36.0 g/dL   RDW 14.6  11.5 - 15.5 %   Platelets 193  150 - 400 K/uL   Neutrophils Relative %  78 (*) 43 - 77 %   Neutro Abs 5.0  1.7 - 7.7 K/uL   Lymphocytes Relative 12  12 - 46 %   Lymphs Abs 0.8  0.7 - 4.0 K/uL   Monocytes Relative 8  3 - 12 %   Monocytes Absolute 0.5  0.1 - 1.0 K/uL   Eosinophils Relative 1  0 - 5 %   Eosinophils Absolute 0.1  0.0 - 0.7 K/uL   Basophils Relative 0  0 - 1 %   Basophils Absolute 0.0  0.0 - 0.1 K/uL  BASIC METABOLIC PANEL      Result Value Ref Range   Sodium 141  137 - 147 mEq/L   Potassium 4.2  3.7 - 5.3 mEq/L   Chloride 104  96 - 112 mEq/L   CO2 22  19 - 32 mEq/L   Glucose, Bld 120 (*) 70 - 99 mg/dL   BUN 20  6 - 23 mg/dL   Creatinine, Ser 1.49 (*) 0.50 - 1.35 mg/dL   Calcium 9.3  8.4 - 10.5 mg/dL   GFR calc non Af Amer 52 (*) >90 mL/min   GFR calc Af Amer 60 (*) >90 mL/min  URINALYSIS, ROUTINE W REFLEX MICROSCOPIC      Result Value Ref Range   Color, Urine YELLOW  YELLOW   APPearance CLEAR  CLEAR   Specific Gravity, Urine 1.025  1.005 - 1.030   pH 6.0  5.0 - 8.0   Glucose, UA NEGATIVE  NEGATIVE mg/dL   Hgb urine dipstick NEGATIVE  NEGATIVE   Bilirubin Urine NEGATIVE  NEGATIVE   Ketones, ur NEGATIVE  NEGATIVE mg/dL   Protein, ur NEGATIVE  NEGATIVE mg/dL   Urobilinogen, UA 1.0  0.0 - 1.0 mg/dL   Nitrite NEGATIVE  NEGATIVE   Leukocytes, UA NEGATIVE  NEGATIVE   Dg Chest 2 View  01/07/2014   CLINICAL DATA:  Chest pain.  EXAM: CHEST  2 VIEW  COMPARISON:  DG CHEST 2 VIEW dated 04/12/2010  FINDINGS: Cardiomediastinal silhouette is unremarkable. Linear densities at left costophrenic angle. The lungs are otherwise clear without pleural effusions or focal consolidations. Trachea projects midline and there is no pneumothorax. Soft tissue planes and included osseous structures are non-suspicious. Vascular clips in right axilla and included right arm. Mildly calcified abdominal aorta suspected.  IMPRESSION: Left costophrenic angle atelectasis or scarring.   Electronically Signed   By: Elon Alas   On: 01/07/2014 00:24      EKG  Interpretation None      MDM   Final diagnoses:  Cough    Patient with cough and low back pain. Chest x-ray is negative for pneumonia. Will give breathing treatment, and an inhaler. Will discharge pending normal labs with cough medicine, pain medicine, and PCP followup. Patient understands and agrees with the plan.  Patient signed out to [facility], [place], who will continue care.  Plan:  Follow-up on labs, if unremarkable, discharge to home.    Montine Circle, PA-C 01/07/14 1644

## 2014-01-06 NOTE — ED Notes (Signed)
Pt states he has pain to his lower back. Pt denies injury to area. Pt reports nasal congestion with a non productive cough for 3 days. Pt alert and ambulatory. Pt denies SOB or fevers.

## 2014-01-07 DIAGNOSIS — Z940 Kidney transplant status: Secondary | ICD-10-CM | POA: Diagnosis not present

## 2014-01-07 DIAGNOSIS — D631 Anemia in chronic kidney disease: Secondary | ICD-10-CM | POA: Diagnosis not present

## 2014-01-07 DIAGNOSIS — Z79899 Other long term (current) drug therapy: Secondary | ICD-10-CM | POA: Diagnosis not present

## 2014-01-07 DIAGNOSIS — N039 Chronic nephritic syndrome with unspecified morphologic changes: Secondary | ICD-10-CM | POA: Diagnosis not present

## 2014-01-07 LAB — CBC WITH DIFFERENTIAL/PLATELET
Basophils Absolute: 0 10*3/uL (ref 0.0–0.1)
Basophils Relative: 0 % (ref 0–1)
Eosinophils Absolute: 0.1 10*3/uL (ref 0.0–0.7)
Eosinophils Relative: 1 % (ref 0–5)
HCT: 41 % (ref 39.0–52.0)
Hemoglobin: 13.6 g/dL (ref 13.0–17.0)
Lymphocytes Relative: 12 % (ref 12–46)
Lymphs Abs: 0.8 10*3/uL (ref 0.7–4.0)
MCH: 28.3 pg (ref 26.0–34.0)
MCHC: 33.2 g/dL (ref 30.0–36.0)
MCV: 85.2 fL (ref 78.0–100.0)
Monocytes Absolute: 0.5 10*3/uL (ref 0.1–1.0)
Monocytes Relative: 8 % (ref 3–12)
Neutro Abs: 5 10*3/uL (ref 1.7–7.7)
Neutrophils Relative %: 78 % — ABNORMAL HIGH (ref 43–77)
Platelets: 193 10*3/uL (ref 150–400)
RBC: 4.81 MIL/uL (ref 4.22–5.81)
RDW: 14.6 % (ref 11.5–15.5)
WBC: 6.4 10*3/uL (ref 4.0–10.5)

## 2014-01-07 LAB — URINALYSIS, ROUTINE W REFLEX MICROSCOPIC
Bilirubin Urine: NEGATIVE
Glucose, UA: NEGATIVE mg/dL
Hgb urine dipstick: NEGATIVE
Ketones, ur: NEGATIVE mg/dL
Leukocytes, UA: NEGATIVE
Nitrite: NEGATIVE
Protein, ur: NEGATIVE mg/dL
Specific Gravity, Urine: 1.025 (ref 1.005–1.030)
Urobilinogen, UA: 1 mg/dL (ref 0.0–1.0)
pH: 6 (ref 5.0–8.0)

## 2014-01-07 LAB — BASIC METABOLIC PANEL
BUN: 20 mg/dL (ref 6–23)
CO2: 22 mEq/L (ref 19–32)
Calcium: 9.3 mg/dL (ref 8.4–10.5)
Chloride: 104 mEq/L (ref 96–112)
Creatinine, Ser: 1.49 mg/dL — ABNORMAL HIGH (ref 0.50–1.35)
GFR calc Af Amer: 60 mL/min — ABNORMAL LOW (ref >90)
GFR calc non Af Amer: 52 mL/min — ABNORMAL LOW (ref >90)
Glucose, Bld: 120 mg/dL — ABNORMAL HIGH (ref 70–99)
Potassium: 4.2 mEq/L (ref 3.7–5.3)
Sodium: 141 mEq/L (ref 137–147)

## 2014-01-07 MED ORDER — DEXTROMETHORPHAN POLISTIREX 30 MG/5ML PO LQCR: 15.0000 mg | ORAL | Status: DC | PRN

## 2014-01-07 MED ORDER — ALBUTEROL SULFATE HFA 108 (90 BASE) MCG/ACT IN AERS: 2.0000 | INHALATION_SPRAY | RESPIRATORY_TRACT | Status: DC | PRN

## 2014-01-07 MED ORDER — HYDROCODONE-ACETAMINOPHEN 5-325 MG PO TABS
1.0000 | ORAL_TABLET | ORAL | Status: DC | PRN
Start: 2014-01-07 — End: 2014-02-18

## 2014-01-07 NOTE — ED Provider Notes (Signed)
Medical screening examination/treatment/procedure(s) were performed by non-physician practitioner and as supervising physician I was immediately available for consultation/collaboration.   EKG Interpretation None       Kalman Drape, MD 01/07/14 2054

## 2014-01-07 NOTE — Discharge Instructions (Signed)

## 2014-02-18 ENCOUNTER — Encounter (HOSPITAL_COMMUNITY): Payer: Self-pay | Admitting: Emergency Medicine

## 2014-02-18 ENCOUNTER — Emergency Department (HOSPITAL_COMMUNITY)
Admission: EM | Admit: 2014-02-18 | Discharge: 2014-02-18 | Disposition: A | Discharge status: Home / Self Care | Payer: Medicare Other | Attending: Emergency Medicine | Admitting: Emergency Medicine

## 2014-02-18 DIAGNOSIS — Z7982 Long term (current) use of aspirin: Secondary | ICD-10-CM | POA: Insufficient documentation

## 2014-02-18 DIAGNOSIS — R269 Unspecified abnormalities of gait and mobility: Secondary | ICD-10-CM | POA: Diagnosis not present

## 2014-02-18 DIAGNOSIS — I10 Essential (primary) hypertension: Secondary | ICD-10-CM | POA: Insufficient documentation

## 2014-02-18 DIAGNOSIS — F17200 Nicotine dependence, unspecified, uncomplicated: Secondary | ICD-10-CM | POA: Diagnosis not present

## 2014-02-18 DIAGNOSIS — IMO0002 Reserved for concepts with insufficient information to code with codable children: Secondary | ICD-10-CM | POA: Diagnosis not present

## 2014-02-18 DIAGNOSIS — Z87448 Personal history of other diseases of urinary system: Secondary | ICD-10-CM | POA: Diagnosis not present

## 2014-02-18 DIAGNOSIS — Z79899 Other long term (current) drug therapy: Secondary | ICD-10-CM | POA: Insufficient documentation

## 2014-02-18 DIAGNOSIS — E785 Hyperlipidemia, unspecified: Secondary | ICD-10-CM | POA: Insufficient documentation

## 2014-02-18 DIAGNOSIS — M5416 Radiculopathy, lumbar region: Secondary | ICD-10-CM

## 2014-02-18 MED ORDER — HYDROCODONE-ACETAMINOPHEN 5-325 MG PO TABS: ORAL_TABLET | ORAL | Status: DC

## 2014-02-18 NOTE — Discharge Instructions (Signed)
Take vicodin for breakthrough pain, do not drink alcohol, drive, care for children or do other critical tasks while taking vicodin.  Please be very careful not to fall! The pain medication  puts you at risk for falls. Please rest as much as possible and try to not stay alone.   Please follow with your primary care doctor in the next 2 days for a check-up. They must obtain records for further management.   Do not hesitate to return to the Emergency Department for any new, worsening or concerning symptoms.    Lumbosacral Radiculopathy Lumbosacral radiculopathy is a pinched nerve or nerves in the low back (lumbosacral area). When this happens you may have weakness in your legs and may not be able to stand on your toes. You may have pain going down into your legs. There may be difficulties with walking normally. There are many causes of this problem. Sometimes this may happen from an injury, or simply from arthritis or boney problems. It may also be caused by other illnesses such as diabetes. If there is no improvement after treatment, further studies may be done to find the exact cause. DIAGNOSIS  X-rays may be needed if the problems become long standing. Electromyograms may be done. This study is one in which the working of nerves and muscles is studied. HOME CARE INSTRUCTIONS   Applications of ice packs may be helpful. Ice can be used in a plastic bag with a towel around it to prevent frostbite to skin. This may be used every 2 hours for 20 to 30 minutes, or as needed, while awake, or as directed by your caregiver.  Only take over-the-counter or prescription medicines for pain, discomfort, or fever as directed by your caregiver.  If physical therapy was prescribed, follow your caregiver's directions. SEEK IMMEDIATE MEDICAL CARE IF:   You have pain not controlled with medications.  You seem to be getting worse rather than better.  You develop increasing weakness in your legs.  You develop  loss of bowel or bladder control.  You have difficulty with walking or balance, or develop clumsiness in the use of your legs.  You have a fever. MAKE SURE YOU:   Understand these instructions.  Will watch your condition.  Will get help right away if you are not doing well or get worse. Document Released: 09/17/2005 Document Revised: 12/10/2011 Document Reviewed: 05/07/2008 [facility] Patient Information 2014 [place].

## 2014-02-18 NOTE — ED Provider Notes (Signed)
CSN: [id number]     Arrival date & time 02/18/14  1352 History  This chart was scribed for Monico Blitz, PA-C working with Shaune Pollack, MD by Stacy Gardner, ED scribe. This patient was seen in room WTR8/WTR8 and the patient's care was started at 3:02 PM.  None    Chief Complaint  Patient presents with  . Back Pain     (Consider location/radiation/quality/duration/timing/severity/associated sxs/prior Treatment) Patient is a 53 y.o. male presenting with back pain. The history is provided by the patient and medical records. No language interpreter was used.  Back Pain Associated symptoms: no fever    HPI Comments: Theodore Arellano is a 53 y.o. male who presents to the Emergency Department complaining of back pain, onset last year, that is getting progressively worse.  The pain radiates to his bilateral hips and is worse with walking.   He has tried Tylenol without any improvement of his symptoms.  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 He ambulates with a cane. Pt has an appointement to see his surgeon next month.   Pt request pain management. Denies fever, No urinary incontinence.  No bowel incontinence. Pt drove to the ED today. Had been recommended to have laminectomy which he declines. Past Medical History  Diagnosis Date  . Hypertension   . Hyperlipidemia   . Kidney disease     s/p kidney transplant 2011   Past Surgical History  Procedure Laterality Date  . Kidney transplant  2011  . Hd access procedures     Family History  Problem Relation Age of Onset  . Hypertension Father   . Diabetes Mother    History  Substance Use Topics  . Smoking status: Current Every Day Smoker -- 0.50 packs/day    Types: Cigarettes  . Smokeless tobacco: Never Used  . Alcohol Use: Yes    Review of Systems  Constitutional: Negative for fever.  Gastrointestinal:       No bowel incontinence.   Genitourinary:       No urinary  incontinence.   Musculoskeletal: Positive for arthralgias, back pain, gait problem and myalgias.  All other systems reviewed and are negative.     Allergies  Shellfish allergy and Tomato  Home Medications   Prior to Admission medications   Medication Sig Start Date End Date Taking? Authorizing Provider  ALPRAZolam Duanne Moron) 1 MG tablet Take 1 mg by mouth daily as needed. For anxiety.   Yes Historical Provider, MD  amLODipine (NORVASC) 10 MG tablet Take 10 mg by mouth daily.    Yes Historical Provider, MD  aspirin EC 81 MG tablet Take 81 mg by mouth daily.   Yes Historical Provider, MD  budesonide (PULMICORT) 180 MCG/ACT inhaler Inhale 2 puffs into the lungs 2 (two) times daily as needed (wheezing).    Yes Historical Provider, MD  carvedilol (COREG) 25 MG tablet Take 25 mg by mouth 2 (two) times daily with a meal.   Yes Historical Provider, MD  colchicine 0.6 MG tablet Take 0.6 mg by mouth daily as needed. For gout   Yes Historical Provider, MD  gabapentin (NEURONTIN) 100 MG capsule Take 100 mg by mouth 2 (two) times daily.   Yes Historical Provider, MD  lisinopril (PRINIVIL,ZESTRIL) 10 MG tablet Take 10 mg by mouth daily.   Yes Historical Provider, MD  magnesium oxide (MAG-OX) 400 MG tablet Take 400 mg by mouth every evening.    Yes  Historical Provider, MD  mycophenolate (MYFORTIC) 360 MG TBEC Take 720 mg by mouth 2 (two) times daily.   Yes Historical Provider, MD  omeprazole (PRILOSEC) 20 MG capsule Take 20 mg by mouth daily.   Yes Historical Provider, MD  pioglitazone (ACTOS) 15 MG tablet Take 15 mg by mouth daily.   Yes Historical Provider, MD  predniSONE (DELTASONE) 5 MG tablet Take 5 mg by mouth every morning.    Yes Historical Provider, MD  simvastatin (ZOCOR) 40 MG tablet Take 40 mg by mouth every Monday, Wednesday, and Friday.    Yes Historical Provider, MD  sodium bicarbonate 650 MG tablet Take 1,300 mg by mouth 2 (two) times daily.   Yes Historical Provider, MD  tacrolimus  (PROGRAF) 1 MG capsule Take 2-3 mg by mouth 2 (two) times daily. Take 3 mg every AM and 2 mg every PM   Yes Historical Provider, MD   BP 136/81  Pulse 67  Temp(Src) 98.3 F (36.8 C) (Oral)  Resp 20  SpO2 100% Physical Exam  Nursing note and vitals reviewed. Constitutional: He is oriented to person, place, and time. He appears well-developed and well-nourished. No distress.  HENT:  Head: Normocephalic and atraumatic.  Eyes: EOM are normal. Pupils are equal, round, and reactive to light.  Neck: Normal range of motion. Neck supple. No tracheal deviation present.  Cardiovascular: Normal rate, regular rhythm and intact distal pulses.   Pulmonary/Chest: Effort normal and breath sounds normal. No respiratory distress. He has no wheezes. He has no rales. He exhibits no tenderness.  Abdominal: Soft. Bowel sounds are normal. He exhibits no distension and no mass. There is no tenderness. There is no rebound and no guarding.  Musculoskeletal: Normal range of motion.  No point tenderness to percussion of lumbar spinal processes.  No TTP or paraspinal muscular spasm. Strength is 5 out of 5 to bilateral lower extremities at hip and knee; extensor hallucis longus 5 out of 5. Ankle strength 5 out of 5, no clonus, neurovascularly intact. No saddle anaesthesia. Patellar reflexes are 2+ bilaterally.    Ambulates with coordinated and nonantalgic gait   Neurological: He is alert and oriented to person, place, and time.  Skin: Skin is warm and dry. No erythema.  Psychiatric: He has a normal mood and affect. His behavior is normal.    ED Course  Procedures (including critical care time) DIAGNOSTIC STUDIES: Oxygen Saturation is 100% on room air, normal by my interpretation.    COORDINATION OF CARE:  3:08 PM Discussed course of care with pt which includes pain medication prescription . Pt understands and agrees.   Labs Review Labs Reviewed - No data to display  Imaging Review No results found.   EKG  Interpretation None      MDM   Final diagnoses:  None    Filed Vitals:   02/18/14 1405 02/18/14 1533  BP: 136/81 133/86  Pulse: 67 66  Temp: 98.3 F (36.8 C) 98.5 F (36.9 C)  TempSrc: Oral Oral  Resp: 20 20  SpO2: 100% 100%    Medications - No data to display  Theodore Arellano is a 53 y.o. male presenting with exacerbation of chronic low back pain. Neurologic exam without abnormality. Advised patient to follow closely with orthopedist and consider laminectomy for pain relief.  Evaluation does not show pathology that would require ongoing emergent intervention or inpatient treatment. Pt is hemodynamically stable and mentating appropriately. Discussed findings and plan with patient/guardian, who agrees with care plan. All questions  answered. Return precautions discussed and outpatient follow up given.   Discharge Medication List as of 02/18/2014  3:10 PM    START taking these medications   Details  HYDROcodone-acetaminophen (NORCO/VICODIN) 5-325 MG per tablet Take 1-2 tablets by mouth every 6 hours as needed for pain., Print        Note: Portions of this report may have been transcribed using voice recognition software. Every effort was made to ensure accuracy; however, inadvertent computerized transcription errors may be present  I personally performed the services described in this documentation, which was scribed in my presence. The recorded information has been reviewed and is accurate.     Monico Blitz, PA-C 02/18/14 2052

## 2014-02-18 NOTE — ED Notes (Signed)
Pt reports chronic lower back pain that has been ongoing for the past year. Pt states he has an appointment in June to see a surgeon regarding his back pain, however he is requesting medication for pain management. Pt is A/O x4, in NAD, and vitals are WDL.

## 2014-02-19 DIAGNOSIS — Z79899 Other long term (current) drug therapy: Secondary | ICD-10-CM | POA: Diagnosis not present

## 2014-02-19 DIAGNOSIS — D631 Anemia in chronic kidney disease: Secondary | ICD-10-CM | POA: Diagnosis not present

## 2014-02-19 DIAGNOSIS — Z940 Kidney transplant status: Secondary | ICD-10-CM | POA: Diagnosis not present

## 2014-02-19 NOTE — ED Provider Notes (Signed)
History/physical exam/procedure(s) were performed by non-physician practitioner and as supervising physician I was immediately available for consultation/collaboration. I have reviewed all notes and am in agreement with care and plan.   Shaune Pollack, MD 02/19/14 [phone]

## 2014-02-23 DIAGNOSIS — N2581 Secondary hyperparathyroidism of renal origin: Secondary | ICD-10-CM | POA: Diagnosis not present

## 2014-02-23 DIAGNOSIS — N039 Chronic nephritic syndrome with unspecified morphologic changes: Secondary | ICD-10-CM | POA: Diagnosis not present

## 2014-02-23 DIAGNOSIS — D631 Anemia in chronic kidney disease: Secondary | ICD-10-CM | POA: Diagnosis not present

## 2014-02-23 DIAGNOSIS — N185 Chronic kidney disease, stage 5: Secondary | ICD-10-CM | POA: Diagnosis not present

## 2014-02-23 DIAGNOSIS — Z940 Kidney transplant status: Secondary | ICD-10-CM | POA: Diagnosis not present

## 2014-03-23 ENCOUNTER — Emergency Department (HOSPITAL_COMMUNITY)
Admission: EM | Admit: 2014-03-23 | Discharge: 2014-03-23 | Disposition: A | Discharge status: Home / Self Care | Payer: Medicare Other | Attending: Emergency Medicine | Admitting: Emergency Medicine

## 2014-03-23 ENCOUNTER — Encounter (HOSPITAL_COMMUNITY): Payer: Self-pay | Admitting: Emergency Medicine

## 2014-03-23 DIAGNOSIS — Z87448 Personal history of other diseases of urinary system: Secondary | ICD-10-CM | POA: Insufficient documentation

## 2014-03-23 DIAGNOSIS — Z7982 Long term (current) use of aspirin: Secondary | ICD-10-CM | POA: Insufficient documentation

## 2014-03-23 DIAGNOSIS — Z940 Kidney transplant status: Secondary | ICD-10-CM | POA: Insufficient documentation

## 2014-03-23 DIAGNOSIS — M545 Low back pain, unspecified: Secondary | ICD-10-CM | POA: Diagnosis not present

## 2014-03-23 DIAGNOSIS — I10 Essential (primary) hypertension: Secondary | ICD-10-CM | POA: Diagnosis not present

## 2014-03-23 DIAGNOSIS — F17200 Nicotine dependence, unspecified, uncomplicated: Secondary | ICD-10-CM | POA: Diagnosis not present

## 2014-03-23 DIAGNOSIS — G8929 Other chronic pain: Secondary | ICD-10-CM | POA: Diagnosis not present

## 2014-03-23 DIAGNOSIS — E785 Hyperlipidemia, unspecified: Secondary | ICD-10-CM | POA: Insufficient documentation

## 2014-03-23 DIAGNOSIS — Z79899 Other long term (current) drug therapy: Secondary | ICD-10-CM | POA: Insufficient documentation

## 2014-03-23 DIAGNOSIS — M2559 Pain in other specified joint: Secondary | ICD-10-CM | POA: Insufficient documentation

## 2014-03-23 DIAGNOSIS — M2550 Pain in unspecified joint: Secondary | ICD-10-CM

## 2014-03-23 DIAGNOSIS — IMO0002 Reserved for concepts with insufficient information to code with codable children: Secondary | ICD-10-CM | POA: Diagnosis not present

## 2014-03-23 MED ORDER — OXYCODONE-ACETAMINOPHEN 5-325 MG PO TABS: 1.0000 | ORAL_TABLET | ORAL | Status: DC | PRN

## 2014-03-23 MED ORDER — OXYCODONE-ACETAMINOPHEN 5-325 MG PO TABS: 2.0000 | ORAL_TABLET | ORAL | Status: DC | PRN

## 2014-03-23 NOTE — Discharge Instructions (Signed)
Take percocet for severe pain - Please be careful with this medication.  It can cause drowsiness.  Use caution while driving, operating machinery, drinking alcohol, or any other activities that may impair your physical or mental abilities.   Return to the emergency department if you develop any changing/worsening condition, loss of bowel/bladder function, weakness, fever, or any other concerns (please read additional information regarding your condition below)    Back Pain, Adult Low back pain is very common. About 1 in 5 people have back pain.The cause of low back pain is rarely dangerous. The pain often gets better over time.About half of people with a sudden onset of back pain feel better in just 2 weeks. About 8 in 10 people feel better by 6 weeks.  CAUSES Some common causes of back pain include:  Strain of the muscles or ligaments supporting the spine.  Wear and tear (degeneration) of the spinal discs.  Arthritis.  Direct injury to the back. DIAGNOSIS Most of the time, the direct cause of low back pain is not known.However, back pain can be treated effectively even when the exact cause of the pain is unknown.Answering your caregiver's questions about your overall health and symptoms is one of the most accurate ways to make sure the cause of your pain is not dangerous. If your caregiver needs more information, he or she may order lab work or imaging tests (X-rays or MRIs).However, even if imaging tests show changes in your back, this usually does not require surgery. HOME CARE INSTRUCTIONS For many people, back pain returns.Since low back pain is rarely dangerous, it is often a condition that people can learn to [facility] their own.   Remain active. It is stressful on the back to sit or stand in one place. Do not sit, drive, or stand in one place for more than 30 minutes at a time. Take short walks on level surfaces as soon as pain allows.Try to increase the length of time you walk  each day.  Do not stay in bed.Resting more than 1 or 2 days can delay your recovery.  Do not avoid exercise or work.Your body is made to move.It is not dangerous to be active, even though your back may hurt.Your back will likely heal faster if you return to being active before your pain is gone.  Pay attention to your body when you bend and lift. Many people have less discomfortwhen lifting if they bend their knees, keep the load close to their bodies,and avoid twisting. Often, the most comfortable positions are those that put less stress on your recovering back.  Find a comfortable position to sleep. Use a firm mattress and lie on your side with your knees slightly bent. If you lie on your back, put a pillow under your knees.  Only take over-the-counter or prescription medicines as directed by your caregiver. Over-the-counter medicines to reduce pain and inflammation are often the most helpful.Your caregiver may prescribe muscle relaxant drugs.These medicines help dull your pain so you can more quickly return to your normal activities and healthy exercise.  Put ice on the injured area.  Put ice in a plastic bag.  Place a towel between your skin and the bag.  Leave the ice on for 15-20 minutes, 03-04 times a day for the first 2 to 3 days. After that, ice and heat may be alternated to reduce pain and spasms.  Ask your caregiver about trying back exercises and gentle massage. This may be of some benefit.  Avoid  feeling anxious or stressed.Stress increases muscle tension and can worsen back pain.It is important to recognize when you are anxious or stressed and learn ways to manage it.Exercise is a great option. SEEK MEDICAL CARE IF:  You have pain that is not relieved with rest or medicine.  You have pain that does not improve in 1 week.  You have new symptoms.  You are generally not feeling well. SEEK IMMEDIATE MEDICAL CARE IF:   You have pain that radiates from your back  into your legs.  You develop new bowel or bladder control problems.  You have unusual weakness or numbness in your arms or legs.  You develop nausea or vomiting.  You develop abdominal pain.  You feel faint. Document Released: 09/17/2005 Document Revised: 03/18/2012 Document Reviewed: 02/05/2011 [facility] Patient Information 2015 [place], [place]. This information is not intended to replace advice given to you by your health care provider. Make sure you discuss any questions you have with your health care provider.

## 2014-03-23 NOTE — ED Notes (Signed)
Pt states chronic back pain.  No meds.  No new injury

## 2014-03-23 NOTE — ED Provider Notes (Signed)
CSN: [id number]     Arrival date & time 03/23/14  1126 History  This chart was scribed for non-physician practitioner, Vernie Murders, PA-C working with Jasper Riling. Alvino Chapel, MD by Frederich Balding, ED scribe. This patient was seen in room WTR5/WTR5 and the patient's care was started at 12:10 PM.   Chief Complaint  Patient presents with  . Back Pain   The history is provided by the patient. No language interpreter was used.   HPI Comments: Theodore Arellano is a 53 y.o. male with history of hypertension, hyperlipidemia, kidney disease and kidney transplant who presents to the Emergency Department complaining of chronic back pain. Denies new injury or trauma. States pain worsened yesterday and is now radiating into bilateral hips and legs, right worse than left. This is his normal pain. Movements worsen the pain. Pt has had shingles to his right lower back in the past and is concerned it is coming back. He has not noticed a rash. Pt can not go see his PCP until he pays the money he owes them. He has been told by his orthopedic surgeon in the past that he needs back surgery, but cannot due to his health problems. Denies fever, abdominal pain, bowel or bladder incontinence, dysuria. Denies history of cancer or IV drug use.   Orthopedic surgeon is Dr. Rolena Infante   Past Medical History  Diagnosis Date  . Hypertension   . Hyperlipidemia   . Kidney disease     s/p kidney transplant 2011   Past Surgical History  Procedure Laterality Date  . Kidney transplant  2011  . Hd access procedures     Family History  Problem Relation Age of Onset  . Hypertension Father   . Diabetes Mother    History  Substance Use Topics  . Smoking status: Current Every Day Smoker -- 0.50 packs/day    Types: Cigarettes  . Smokeless tobacco: Never Used  . Alcohol Use: Yes    Review of Systems  Constitutional: Negative for fever.  Gastrointestinal: Negative for abdominal pain.  Genitourinary: Negative for dysuria.        Negative for bowel or bladder incontinence.  Musculoskeletal: Positive for arthralgias, back pain and myalgias.  Skin: Negative for rash.  All other systems reviewed and are negative.   Allergies  Shellfish allergy and Tomato  Home Medications   Prior to Admission medications   Medication Sig Start Date End Date Taking? Authorizing Provider  ALPRAZolam Duanne Moron) 1 MG tablet Take 1 mg by mouth daily as needed. For anxiety.    Historical Provider, MD  amLODipine (NORVASC) 10 MG tablet Take 10 mg by mouth daily.     Historical Provider, MD  aspirin EC 81 MG tablet Take 81 mg by mouth daily.    Historical Provider, MD  budesonide (PULMICORT) 180 MCG/ACT inhaler Inhale 2 puffs into the lungs 2 (two) times daily as needed (wheezing).     Historical Provider, MD  carvedilol (COREG) 25 MG tablet Take 25 mg by mouth 2 (two) times daily with a meal.    Historical Provider, MD  colchicine 0.6 MG tablet Take 0.6 mg by mouth daily as needed. For gout    Historical Provider, MD  gabapentin (NEURONTIN) 100 MG capsule Take 100 mg by mouth 2 (two) times daily.    Historical Provider, MD  HYDROcodone-acetaminophen (NORCO/VICODIN) 5-325 MG per tablet Take 1-2 tablets by mouth every 6 hours as needed for pain. 02/18/14   Nicole Pisciotta, PA-C  lisinopril (PRINIVIL,ZESTRIL) 10 MG tablet  Take 10 mg by mouth daily.    Historical Provider, MD  magnesium oxide (MAG-OX) 400 MG tablet Take 400 mg by mouth every evening.     Historical Provider, MD  mycophenolate (MYFORTIC) 360 MG TBEC Take 720 mg by mouth 2 (two) times daily.    Historical Provider, MD  omeprazole (PRILOSEC) 20 MG capsule Take 20 mg by mouth daily.    Historical Provider, MD  pioglitazone (ACTOS) 15 MG tablet Take 15 mg by mouth daily.    Historical Provider, MD  predniSONE (DELTASONE) 5 MG tablet Take 5 mg by mouth every morning.     Historical Provider, MD  simvastatin (ZOCOR) 40 MG tablet Take 40 mg by mouth every Monday, Wednesday, and Friday.      Historical Provider, MD  sodium bicarbonate 650 MG tablet Take 1,300 mg by mouth 2 (two) times daily.    Historical Provider, MD  tacrolimus (PROGRAF) 1 MG capsule Take 2-3 mg by mouth 2 (two) times daily. Take 3 mg every AM and 2 mg every PM    Historical Provider, MD   BP 131/70  Pulse 68  Temp(Src) 98 F (36.7 C) (Oral)  Resp 18  SpO2 99%  Filed Vitals:   03/23/14 1159  BP: 131/70  Pulse: 68  Temp: 98 F (36.7 C)  TempSrc: Oral  Resp: 18  SpO2: 99%    Physical Exam  Nursing note and vitals reviewed. Constitutional: He is oriented to person, place, and time. He appears well-developed and well-nourished. No distress.  HENT:  Head: Normocephalic and atraumatic.  Right Ear: External ear normal.  Left Ear: External ear normal.  Mouth/Throat: Oropharynx is clear and moist.  Eyes: Conjunctivae and EOM are normal. Right eye exhibits no discharge. Left eye exhibits no discharge.  Neck: Normal range of motion. Neck supple. No tracheal deviation present.  Cardiovascular: Normal rate, regular rhythm, normal heart sounds and intact distal pulses.  Exam reveals no gallop and no friction rub.   No murmur heard. Dorsalis pedis pulses present and equal bilaterally  Pulmonary/Chest: Effort normal and breath sounds normal. No respiratory distress. He has no wheezes. He has no rales. He exhibits no tenderness.  Abdominal: Soft. He exhibits no distension. There is no tenderness.  Musculoskeletal: Normal range of motion. He exhibits tenderness. He exhibits no edema.       Arms: Tenderness to palpation to the paraspinal muscles (R>L). No hip tenderness. ROM intact in the LE. Strength 5/5 in the upper and lower extremities bilaterally. Patient able to ambulate without difficulty or ataxia. No LE edema or calf tenderness bilaterally  Neurological: He is alert and oriented to person, place, and time.  Reflex Scores:      Patellar reflexes are 2+ on the right side and 2+ on the left  side. Sensation intact in the LE bilaterally  Skin: Skin is warm and dry. He is not diaphoretic.  No rash to the flanks, back, or abdomen   Psychiatric: He has a normal mood and affect. His behavior is normal.    ED Course  Procedures (including critical care time)  DIAGNOSTIC STUDIES: Oxygen Saturation is 99% on RA, normal by my interpretation.    COORDINATION OF CARE: 12:15 PM-Discussed treatment plan which includes a short course of percocet with pt at bedside and pt agreed to plan. Advised pt that the ED is not the place to get chronic pain medications and to follow up with his PCP as soon as he can. Return precautions given.  Labs Review Labs Reviewed - No data to display  Imaging Review No results found.   EKG Interpretation None      MDM   Theodore Arellano is a 53 y.o. male with history of hypertension, hyperlipidemia, kidney disease and kidney transplant who presents to the Emergency Department complaining of chronic back pain. No new injuries or acute changes. No warning signs or symptoms of back pain including loss of bowel or bladder control, night sweats, waking from sleep with back pain, unexplained fevers or weight loss, history of cancer, or IV drug use. No concern for cauda equina, epidural abscess, or other serious/life threatening cause of back pain. Patient neurovascularly intact. Patient afebrile and non-toxic in appearance. Vital signs stable. Instructed to follow-up with PCP. Return precautions, discharge instructions, and follow-up was discussed with the patient before discharge.     Discharge Medication List as of 03/23/2014 12:20 PM    START taking these medications   Details  oxyCODONE-acetaminophen (PERCOCET/ROXICET) 5-325 MG per tablet Take 2 tablets by mouth every 4 (four) hours as needed for severe pain., Starting 03/23/2014, Until Discontinued, Print        Final impressions: 1. Chronic low back pain      Mercy Moore PA-C   I  personally performed the services described in this documentation, which was scribed in my presence. The recorded information has been reviewed and is accurate.  Lucila [place], PA-C 03/25/14 1047

## 2014-03-25 NOTE — ED Provider Notes (Signed)
Medical screening examination/treatment/procedure(s) were performed by non-physician practitioner and as supervising physician I was immediately available for consultation/collaboration.   EKG Interpretation None       Jasper Riling. Alvino Chapel, MD 03/25/14 [phone]

## 2014-04-28 DIAGNOSIS — M549 Dorsalgia, unspecified: Secondary | ICD-10-CM | POA: Diagnosis not present

## 2014-05-03 DIAGNOSIS — Z79899 Other long term (current) drug therapy: Secondary | ICD-10-CM | POA: Diagnosis not present

## 2014-05-03 DIAGNOSIS — Z940 Kidney transplant status: Secondary | ICD-10-CM | POA: Diagnosis not present

## 2014-05-03 DIAGNOSIS — D631 Anemia in chronic kidney disease: Secondary | ICD-10-CM | POA: Diagnosis not present

## 2014-05-13 DIAGNOSIS — Z5181 Encounter for therapeutic drug level monitoring: Secondary | ICD-10-CM | POA: Diagnosis not present

## 2014-05-13 DIAGNOSIS — Z79899 Other long term (current) drug therapy: Secondary | ICD-10-CM | POA: Diagnosis not present

## 2014-06-19 IMAGING — CR DG HIP COMPLETE 2+V*R*
3 series · 3 of 3 positions shown · non-contrast
Comparison: Right hip films on 01/12/2013 at [facility].

CLINICAL DATA: Right hip pain.

RIGHT HIP - COMPLETE 2+ VIEW

[t pelvis ap]
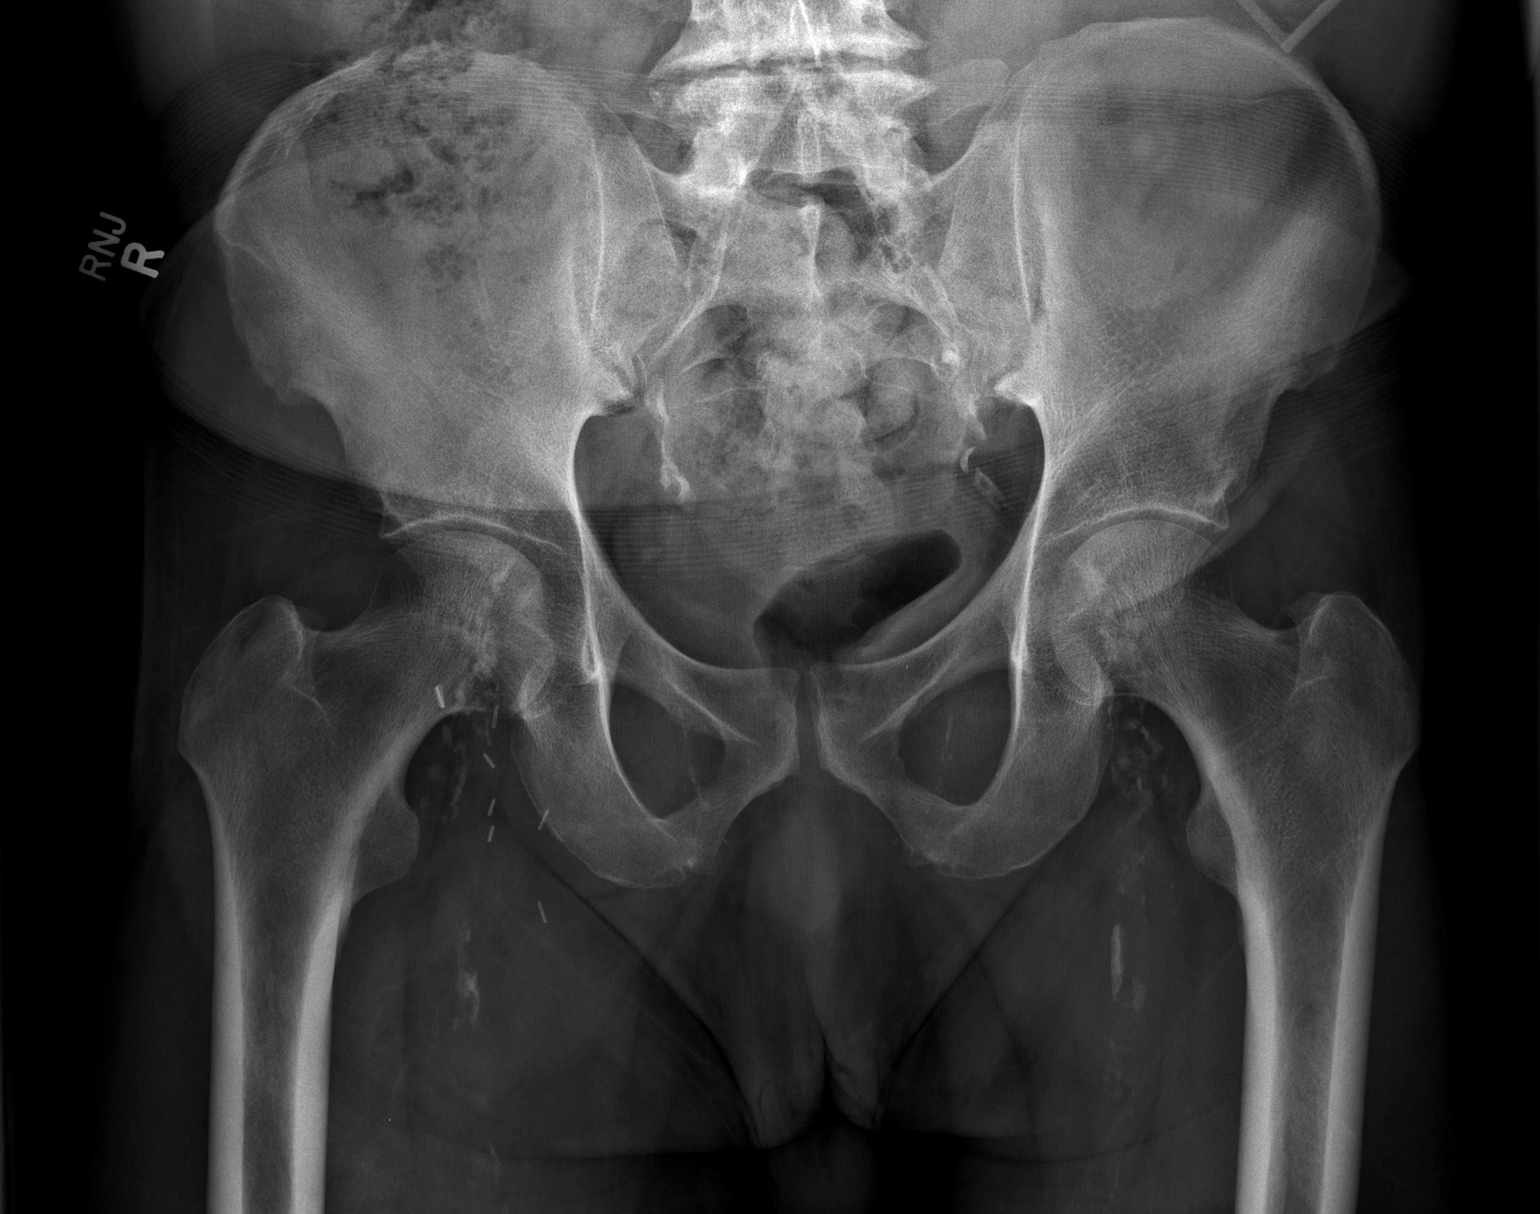

[t hip ap right]
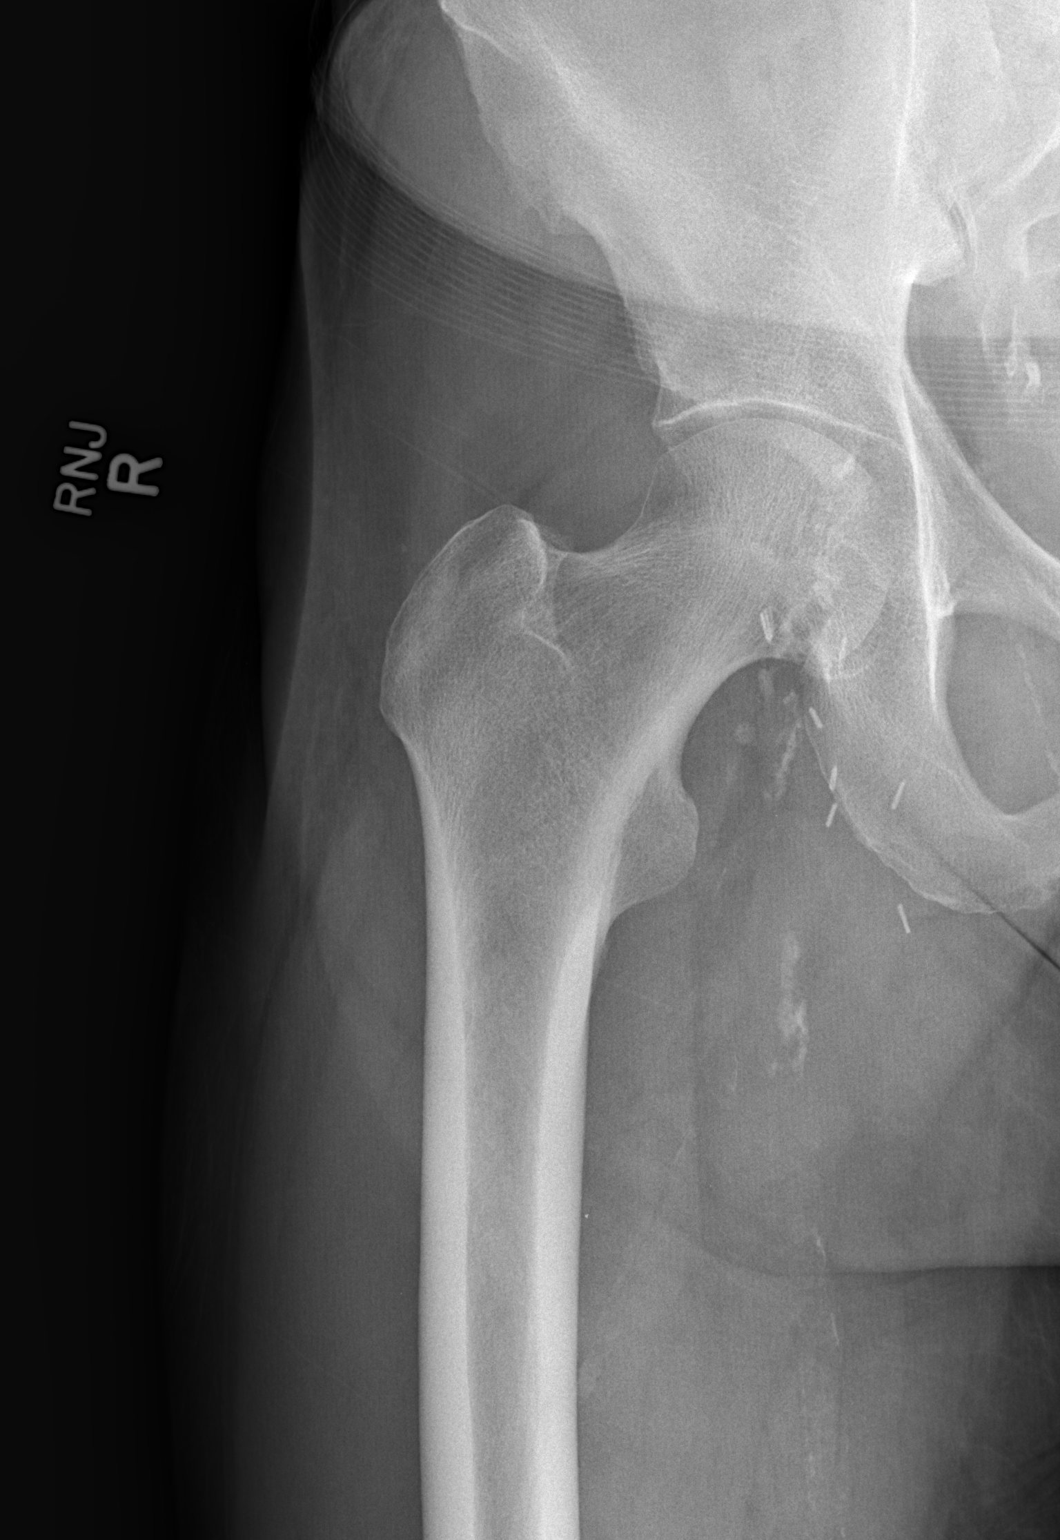

[t hip frog leg right]
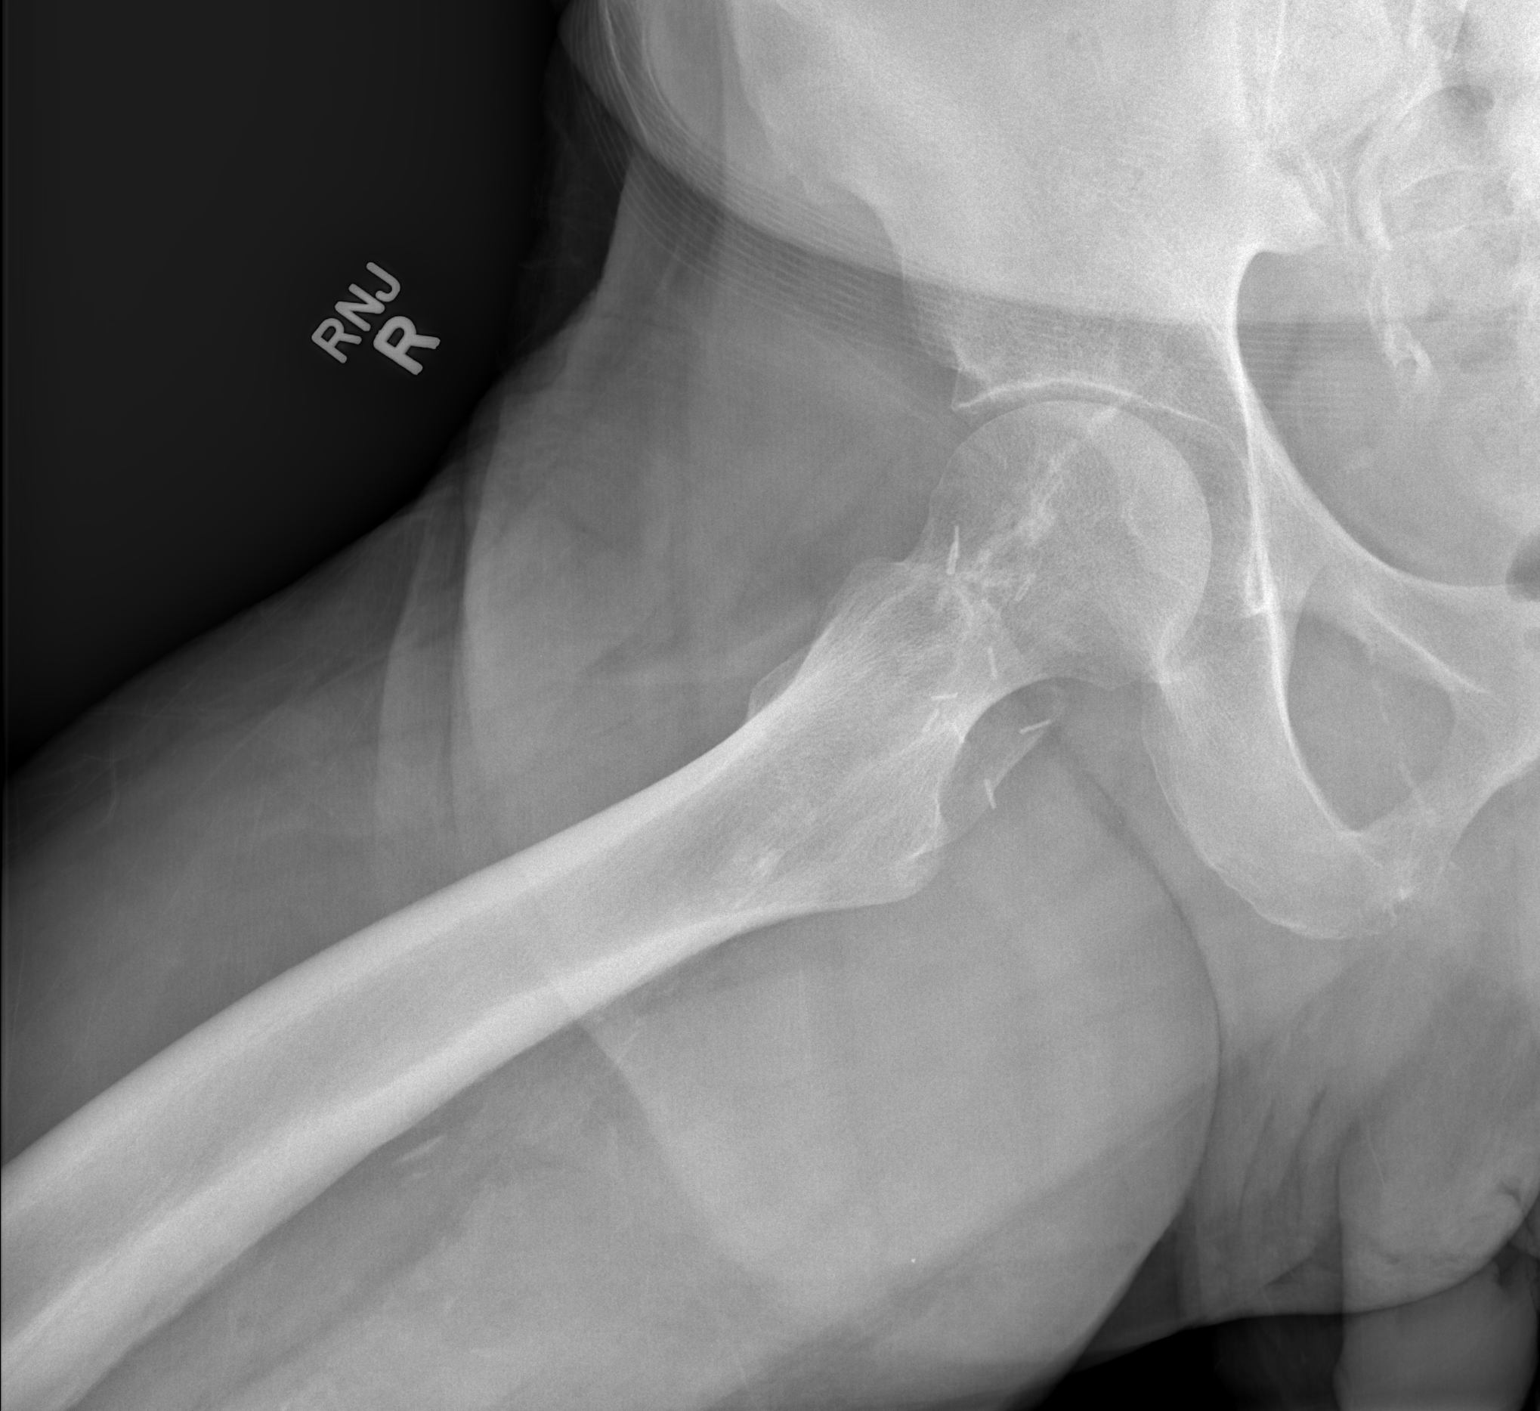

[3 of 3 positions shown; findings below may reference images not displayed]

FINDINGS: Stable radiographic appearance of the right hip since the
recent study with no evidence of fracture, dislocation or bony
lesion.  Stable vascular calcifications identified involving the
femoral arteries.  No significant degenerative changes are
identified.  The bony pelvis is intact.
IMPRESSION: Stable right hip x-rays.  No acute findings.

## 2014-06-22 DIAGNOSIS — D631 Anemia in chronic kidney disease: Secondary | ICD-10-CM | POA: Diagnosis not present

## 2014-06-22 DIAGNOSIS — Z940 Kidney transplant status: Secondary | ICD-10-CM | POA: Diagnosis not present

## 2014-06-22 DIAGNOSIS — N185 Chronic kidney disease, stage 5: Secondary | ICD-10-CM | POA: Diagnosis not present

## 2014-06-22 DIAGNOSIS — N2581 Secondary hyperparathyroidism of renal origin: Secondary | ICD-10-CM | POA: Diagnosis not present

## 2014-07-27 DIAGNOSIS — I10 Essential (primary) hypertension: Secondary | ICD-10-CM | POA: Diagnosis not present

## 2014-07-27 DIAGNOSIS — F17200 Nicotine dependence, unspecified, uncomplicated: Secondary | ICD-10-CM | POA: Diagnosis not present

## 2014-07-27 DIAGNOSIS — F17210 Nicotine dependence, cigarettes, uncomplicated: Secondary | ICD-10-CM | POA: Diagnosis not present

## 2014-07-27 DIAGNOSIS — R0600 Dyspnea, unspecified: Secondary | ICD-10-CM | POA: Diagnosis not present

## 2014-07-27 DIAGNOSIS — R5383 Other fatigue: Secondary | ICD-10-CM | POA: Diagnosis not present

## 2014-07-27 DIAGNOSIS — E785 Hyperlipidemia, unspecified: Secondary | ICD-10-CM | POA: Diagnosis not present

## 2014-07-27 DIAGNOSIS — Z23 Encounter for immunization: Secondary | ICD-10-CM | POA: Diagnosis not present

## 2014-07-27 DIAGNOSIS — R079 Chest pain, unspecified: Secondary | ICD-10-CM | POA: Diagnosis not present

## 2014-08-04 DIAGNOSIS — R079 Chest pain, unspecified: Secondary | ICD-10-CM | POA: Diagnosis not present

## 2014-08-04 DIAGNOSIS — R0789 Other chest pain: Secondary | ICD-10-CM | POA: Diagnosis not present

## 2014-08-13 ENCOUNTER — Ambulatory Visit (INDEPENDENT_AMBULATORY_CARE_PROVIDER_SITE_OTHER): Payer: Medicare Other

## 2014-08-13 VITALS — BP 159/94 | HR 78 | Resp 18

## 2014-08-13 DIAGNOSIS — M79676 Pain in unspecified toe(s): Secondary | ICD-10-CM | POA: Diagnosis not present

## 2014-08-13 DIAGNOSIS — B351 Tinea unguium: Secondary | ICD-10-CM | POA: Diagnosis not present

## 2014-08-13 NOTE — Progress Notes (Signed)
Subjective:     Patient ID: Theodore Arellano, male   DOB: 1961-02-23, 53 y.o.   MRN: [id number]  HPI I need my toenails trimmed up and they are long and thick and I do have some issues with my spine and I do get numbness and tingling    Review of Systems  Musculoskeletal: Positive for back pain.       Objective:   Physical Exam 53 year old [place] American male well-developed well-nourished oriented 3 presents at this time with thick brittle yellow chromic dystrophic nails 1 through 4 bilateral painful both on palpation and with enclosed shoe wear and ambulation second nails bilateral are more than initially-length have not been debrided possibly more than 2 years. Remaining nails also thick and brittle crumbly friable with discoloration and subungual debris 1 through 5 bilateral orthopedic biomechanical exam rectus foot type ankle metatarsal subtalar joint motions normal pedal pulses are palpable DP and PT +2 over 4 bilateral capillary refill time 3 seconds all digits epicritic sensations intact and symmetric bilateral. There is normal plantar response DTRs not elicited neurologically skin color pigment normal hair growth absent nails extremely friable discolored brittle crumbly and dystrophic 8     Assessment:     Assessment onychomycosis nails 1 through 4 bilateral thick dystrophic friable brittle crumbly and painful and symptomatic plan at this time     Plan:     Plan at this time is debridement of painful mycotic nails 81 through 4 nails bilateral debrided both feet did write written instructions and recommendations for topical antifungal therapy with OTC Fungi-Nail twice daily for at least a 12 month duration and as-needed basis or every 3 months for continued palliative care in the future as needed next  [employer] DPM

## 2014-08-13 NOTE — Patient Instructions (Signed)
Onychomycosis/Fungal Toenails  WHAT IS IT? An infection that lies within the keratin of your nail plate that is caused by a fungus.  WHY ME? Fungal infections affect all ages, sexes, races, and creeds.  There may be many factors that predispose you to a fungal infection such as age, coexisting medical conditions such as diabetes, or an autoimmune disease; stress, medications, fatigue, genetics, etc.  Bottom line: fungus thrives in a warm, moist environment and your shoes offer such a location.  IS IT CONTAGIOUS? Theoretically, yes.  You do not want to share shoes, nail clippers or files with someone who has fungal toenails.  Walking around barefoot in the same room or sleeping in the same bed is unlikely to transfer the organism.  It is important to realize, however, that fungus can spread easily from one nail to the next on the same foot.  HOW DO WE TREAT THIS?  There are several ways to treat this condition.  Treatment may depend on many factors such as age, medications, pregnancy, liver and kidney conditions, etc.  It is best to ask your doctor which options are available to you.  1. No treatment.   Unlike many other medical concerns, you can live with this condition.  However for many people this can be a painful condition and may lead to ingrown toenails or a bacterial infection.  It is recommended that you keep the nails cut short to help reduce the amount of fungal nail. 2. Topical treatment.  These range from herbal remedies to prescription strength nail lacquers.  About 40-50% effective, topicals require twice daily application for approximately 9 to 12 months or until an entirely new nail has grown out.  The most effective topicals are medical grade medications available through physicians offices. 3. Oral antifungal medications.  With an 80-90% cure rate, the most common oral medication requires 3 to 4 months of therapy and stays in your system for a year as the new nail grows out.  Oral  antifungal medications do require blood work to make sure it is a safe drug for you.  A liver function panel will be performed prior to starting the medication and after the first month of treatment.  It is important to have the blood work performed to avoid any harmful side effects.  In general, this medication safe but blood work is required. 4. Laser Therapy.  This treatment is performed by applying a specialized laser to the affected nail plate.  This therapy is noninvasive, fast, and non-painful.  It is not covered by insurance and is therefore, out of pocket.  The results have been very good with a 80-95% cure rate.  [facility] is the only practice in the area to offer this therapy. 5. Permanent Nail Avulsion.  Removing the entire nail so that a new nail will not grow back.   Fungi-Nail can be purchased at any pharmacy without prescription. Apply Fungi-Nail medication daily or twice daily as possible to each affected toenail for a minimum of 12 months area and continue daily applications until improvements or clearing of nail starts to occur continue for at least 6 months after cleared

## 2014-08-24 DIAGNOSIS — E785 Hyperlipidemia, unspecified: Secondary | ICD-10-CM | POA: Diagnosis not present

## 2014-08-24 DIAGNOSIS — F17200 Nicotine dependence, unspecified, uncomplicated: Secondary | ICD-10-CM | POA: Diagnosis not present

## 2014-08-24 DIAGNOSIS — N189 Chronic kidney disease, unspecified: Secondary | ICD-10-CM | POA: Diagnosis not present

## 2014-08-24 DIAGNOSIS — R079 Chest pain, unspecified: Secondary | ICD-10-CM | POA: Diagnosis not present

## 2014-08-24 DIAGNOSIS — Z940 Kidney transplant status: Secondary | ICD-10-CM | POA: Diagnosis not present

## 2014-08-24 DIAGNOSIS — I10 Essential (primary) hypertension: Secondary | ICD-10-CM | POA: Diagnosis not present

## 2014-09-02 DIAGNOSIS — Z940 Kidney transplant status: Secondary | ICD-10-CM | POA: Diagnosis not present

## 2014-09-02 DIAGNOSIS — D849 Immunodeficiency, unspecified: Secondary | ICD-10-CM | POA: Insufficient documentation

## 2014-09-02 DIAGNOSIS — D8989 Other specified disorders involving the immune mechanism, not elsewhere classified: Secondary | ICD-10-CM | POA: Diagnosis not present

## 2014-09-02 DIAGNOSIS — E785 Hyperlipidemia, unspecified: Secondary | ICD-10-CM | POA: Diagnosis not present

## 2014-09-02 DIAGNOSIS — I10 Essential (primary) hypertension: Secondary | ICD-10-CM | POA: Diagnosis not present

## 2014-09-02 DIAGNOSIS — R21 Rash and other nonspecific skin eruption: Secondary | ICD-10-CM | POA: Insufficient documentation

## 2014-09-02 DIAGNOSIS — I120 Hypertensive chronic kidney disease with stage 5 chronic kidney disease or end stage renal disease: Secondary | ICD-10-CM | POA: Diagnosis not present

## 2014-09-02 DIAGNOSIS — E119 Type 2 diabetes mellitus without complications: Secondary | ICD-10-CM | POA: Diagnosis not present

## 2014-09-02 DIAGNOSIS — F17210 Nicotine dependence, cigarettes, uncomplicated: Secondary | ICD-10-CM | POA: Diagnosis not present

## 2014-09-02 DIAGNOSIS — I2510 Atherosclerotic heart disease of native coronary artery without angina pectoris: Secondary | ICD-10-CM | POA: Diagnosis not present

## 2014-09-02 DIAGNOSIS — Z7982 Long term (current) use of aspirin: Secondary | ICD-10-CM | POA: Diagnosis not present

## 2014-09-02 DIAGNOSIS — Z79899 Other long term (current) drug therapy: Secondary | ICD-10-CM | POA: Diagnosis not present

## 2014-09-02 DIAGNOSIS — N186 End stage renal disease: Secondary | ICD-10-CM | POA: Diagnosis not present

## 2014-09-02 DIAGNOSIS — D649 Anemia, unspecified: Secondary | ICD-10-CM | POA: Diagnosis not present

## 2014-09-09 ENCOUNTER — Encounter (HOSPITAL_COMMUNITY): Payer: Self-pay | Admitting: Internal Medicine

## 2014-09-10 DIAGNOSIS — L280 Lichen simplex chronicus: Secondary | ICD-10-CM | POA: Diagnosis not present

## 2014-09-10 DIAGNOSIS — L2089 Other atopic dermatitis: Secondary | ICD-10-CM | POA: Diagnosis not present

## 2014-09-26 ENCOUNTER — Emergency Department (HOSPITAL_COMMUNITY)
Admission: EM | Admit: 2014-09-26 | Discharge: 2014-09-26 | Disposition: A | Discharge status: Home / Self Care | Payer: Medicare Other | Attending: Emergency Medicine | Admitting: Emergency Medicine

## 2014-09-26 ENCOUNTER — Encounter (HOSPITAL_COMMUNITY): Payer: Self-pay | Admitting: Nurse Practitioner

## 2014-09-26 DIAGNOSIS — Z720 Tobacco use: Secondary | ICD-10-CM | POA: Insufficient documentation

## 2014-09-26 DIAGNOSIS — M25551 Pain in right hip: Secondary | ICD-10-CM | POA: Diagnosis not present

## 2014-09-26 DIAGNOSIS — Z940 Kidney transplant status: Secondary | ICD-10-CM | POA: Insufficient documentation

## 2014-09-26 DIAGNOSIS — Z9889 Other specified postprocedural states: Secondary | ICD-10-CM | POA: Insufficient documentation

## 2014-09-26 DIAGNOSIS — E785 Hyperlipidemia, unspecified: Secondary | ICD-10-CM | POA: Insufficient documentation

## 2014-09-26 DIAGNOSIS — G8929 Other chronic pain: Secondary | ICD-10-CM | POA: Diagnosis not present

## 2014-09-26 DIAGNOSIS — Z79891 Long term (current) use of opiate analgesic: Secondary | ICD-10-CM | POA: Insufficient documentation

## 2014-09-26 DIAGNOSIS — Z79899 Other long term (current) drug therapy: Secondary | ICD-10-CM | POA: Diagnosis not present

## 2014-09-26 DIAGNOSIS — Z792 Long term (current) use of antibiotics: Secondary | ICD-10-CM | POA: Diagnosis not present

## 2014-09-26 DIAGNOSIS — I10 Essential (primary) hypertension: Secondary | ICD-10-CM | POA: Diagnosis not present

## 2014-09-26 DIAGNOSIS — M545 Low back pain: Secondary | ICD-10-CM | POA: Diagnosis present

## 2014-09-26 DIAGNOSIS — Z7952 Long term (current) use of systemic steroids: Secondary | ICD-10-CM | POA: Diagnosis not present

## 2014-09-26 DIAGNOSIS — Z87448 Personal history of other diseases of urinary system: Secondary | ICD-10-CM | POA: Insufficient documentation

## 2014-09-26 DIAGNOSIS — Z7982 Long term (current) use of aspirin: Secondary | ICD-10-CM | POA: Insufficient documentation

## 2014-09-26 MED ORDER — HYDROCODONE-ACETAMINOPHEN 5-325 MG PO TABS: ORAL_TABLET | ORAL | Status: DC

## 2014-09-26 MED ORDER — HYDROCODONE-ACETAMINOPHEN 5-325 MG PO TABS
1.0000 | ORAL_TABLET | Freq: Once | ORAL | Status: AC
  Administered 2014-09-26: 1 via ORAL
  Filled 2014-09-26: qty 1

## 2014-09-26 NOTE — ED Provider Notes (Signed)
CSN: [id number]     Arrival date & time 09/26/14  1821 History  This chart was scribed for non-physician practitioner working with No att. providers found by Mercy Moore, ED Scribe. This patient was seen in room TR08C/TR08C and the patient's care was started at 9:16 PM.   Chief Complaint  Patient presents with  . Back Pain   The history is provided by the patient. No language interpreter was used.   HPI Comments: Theodore Arellano is a 53 y.o. male with PMHx of Hypertension, Hyperlipidemia, and kidney disease who presents to the Emergency Department complaining of lower back pain with radiation into right hip extending down length of right leg since yesterday. Patient reports history of chronic back pain, but is uncertain of cause of this exacerbation episode. Patient reports relief with Percocet, but he has exhausted the medication. Patient reports ambulating with an old pair of crutches because walking and bearing weight are too painful without the support. Patient denies history of cancer, IV drug use, nausea, vomiting, saddle anesthesia, bladder/bowel incontinence, numbness/weakness or new aspects of his pain. Patient reports that he has been unable to visit his PCP due to the holiday season. Patient reports past relief with Percocet and Vicodin. Patient shares that he declined recommendation for surgery.  Past Medical History  Diagnosis Date  . Hypertension   . Hyperlipidemia   . Kidney disease     s/p kidney transplant 2011   Past Surgical History  Procedure Laterality Date  . Kidney transplant  2011  . Hd access procedures    . Right heart catheterization N/A 03/11/2012    Procedure: RIGHT HEART CATH;  Surgeon: Jolaine Artist, MD;  Location: [facility] CATH LAB;  Service: Cardiovascular;  Laterality: N/A;  . Left heart catheterization with coronary angiogram  03/11/2012    Procedure: LEFT HEART CATHETERIZATION WITH CORONARY ANGIOGRAM;  Surgeon: Jolaine Artist, MD;  Location: [facility] CATH LAB;   Service: Cardiovascular;;   Family History  Problem Relation Age of Onset  . Hypertension Father   . Diabetes Mother    History  Substance Use Topics  . Smoking status: Current Every Day Smoker -- 0.50 packs/day    Types: Cigarettes  . Smokeless tobacco: Never Used  . Alcohol Use: Yes    Review of Systems  Constitutional: Negative for fever and chills.  Gastrointestinal: Negative for nausea, vomiting and diarrhea.  Genitourinary: Negative for hematuria.  Musculoskeletal: Positive for back pain.  Neurological: Negative for weakness and numbness.    Allergies  Shellfish allergy and Tomato  Home Medications   Prior to Admission medications   Medication Sig Start Date End Date Taking? Authorizing Provider  ALPRAZolam Duanne Moron) 1 MG tablet Take 1 mg by mouth daily as needed. For anxiety.    Historical Provider, MD  amLODipine (NORVASC) 10 MG tablet Take 10 mg by mouth daily.     Historical Provider, MD  amoxicillin (AMOXIL) 500 MG capsule  06/23/14   Historical Provider, MD  aspirin EC 81 MG tablet Take 81 mg by mouth daily.    Historical Provider, MD  budesonide (PULMICORT) 180 MCG/ACT inhaler Inhale 2 puffs into the lungs 2 (two) times daily as needed (wheezing).     Historical Provider, MD  carvedilol (COREG) 25 MG tablet Take 25 mg by mouth 2 (two) times daily with a meal.    Historical Provider, MD  colchicine 0.6 MG tablet Take 0.6 mg by mouth daily as needed. For gout    Historical Provider, MD  gabapentin (NEURONTIN) 100 MG capsule Take 100 mg by mouth 2 (two) times daily.    Historical Provider, MD  gabapentin (NEURONTIN) 300 MG capsule  07/21/14   Historical Provider, MD  HYDROcodone-acetaminophen (NORCO/VICODIN) 5-325 MG per tablet Take 1-2 tablets by mouth every 6 hours as needed for pain. 09/26/14   Carrie Mew, PA-C  lisinopril (PRINIVIL,ZESTRIL) 10 MG tablet Take 10 mg by mouth daily.    Historical Provider, MD  magnesium oxide (MAG-OX) 400 (241.3 MG) MG tablet   07/09/14   Historical Provider, MD  magnesium oxide (MAG-OX) 400 MG tablet Take 400 mg by mouth every evening.     Historical Provider, MD  mycophenolate (MYFORTIC) 360 MG TBEC Take 720 mg by mouth 2 (two) times daily.    Historical Provider, MD  NITROSTAT 0.4 MG SL tablet  07/27/14   Historical Provider, MD  omeprazole (PRILOSEC) 20 MG capsule Take 20 mg by mouth daily.    Historical Provider, MD  oxyCODONE-acetaminophen (PERCOCET/ROXICET) 5-325 MG per tablet Take 2 tablets by mouth every 4 (four) hours as needed for severe pain. 03/23/14   Lucila [place], PA-C  pioglitazone (ACTOS) 15 MG tablet Take 15 mg by mouth daily.    Historical Provider, MD  predniSONE (DELTASONE) 5 MG tablet Take 5 mg by mouth every morning.     Historical Provider, MD  simvastatin (ZOCOR) 40 MG tablet Take 40 mg by mouth every Monday, Wednesday, and Friday.     Historical Provider, MD  sodium bicarbonate 650 MG tablet Take 1,300 mg by mouth 2 (two) times daily.    Historical Provider, MD  tacrolimus (PROGRAF) 1 MG capsule Take 2-3 mg by mouth 2 (two) times daily. Take 3 mg every AM and 2 mg every PM    Historical Provider, MD  triamcinolone cream (KENALOG) 0.1 %  05/12/14   Historical Provider, MD   Triage Vitals: BP 158/87 mmHg  Pulse 76  Temp(Src) 98 F (36.7 C) (Oral)  Resp 20  Ht 5\' 7"  (1.702 m)  Wt 198 lb (89.812 kg)  BMI 31.00 kg/m2  SpO2 100% Physical Exam  Constitutional: He is oriented to person, place, and time. He appears well-developed and well-nourished. No distress.  HENT:  Head: Normocephalic and atraumatic.  Eyes: EOM are normal.  Neck: Neck supple. No tracheal deviation present.  Cardiovascular: Normal rate.   Pulmonary/Chest: Effort normal. No respiratory distress.  Musculoskeletal:  No spinous process tenderness. Mild tenderness to anterior and lateral portion of trochanter with mild to moderate pain with ROM of right hip. Motor strength 5/5 of all major muscle groups. Distal sensation  intact.   Neurological: He is alert and oriented to person, place, and time. He has normal strength. No cranial nerve deficit or sensory deficit. He displays a negative Romberg sign. Gait normal. GCS eye subscore is 4. GCS verbal subscore is 5. GCS motor subscore is 6.  Patient fully alert answer questions appropriately in full, clear sentences. Cranial nerves II through XII grossly intact. Motor strength 5 out of 5 in all major muscle groups of upper and lower extremities. Distal sensation intact. Gait normal, patient able toward throughout the room and in the ER.  Skin: Skin is warm and dry.  Psychiatric: He has a normal mood and affect. His behavior is normal.  Nursing note and vitals reviewed.   ED Course  Procedures (including critical care time)  COORDINATION OF CARE: 9:20 PM- Discussed treatment plan with patient at bedside and patient agreed to plan.  Labs Review Labs Reviewed - No data to display  Imaging Review No results found.   EKG Interpretation None      MDM   Final diagnoses:  Hip pain, chronic, right   Patient with hip pain which he states is consistent with chronic back/hip problems. Patient describing back pain, however is more tender in his lateral trochanter region. Patient states this pain is related to his back, and is adamant that is chronic in nature. No neurological deficits and normal neuro exam.  Patient can walk but states is painful. Patient reports pain is consistent with chronic pain, and he feels this is an exacerbation of his pain, patient denies new injury or new aspects of or related to his pain.  No loss of bowel or bladder control.  No concern for cauda equina.  No fever, night sweats, weight loss, h/o cancer, IVDU.  RICE protocol and pain medicine indicated and discussed with patient.   I personally performed the services described in this documentation, which was scribed in my presence. The recorded information has been reviewed and is  accurate.  BP 168/94 mmHg  Pulse 67  Temp(Src) 97.9 F (36.6 C) (Oral)  Resp 20  Ht 5\' 7"  (1.702 m)  Wt 198 lb (89.812 kg)  BMI 31.00 kg/m2  SpO2 100%  Signed,  Dahlia Bailiff, PA-C 3:42 AM   Carrie Mew, PA-C 09/27/14 [place], MD 09/29/14 1320

## 2014-09-26 NOTE — Discharge Instructions (Signed)
Follow-up with your primary care doctor. Return to the ER if any severe swelling, severe pain, redness, warmth, high fever.  Hip Pain Your hip is the joint between your upper legs and your lower pelvis. The bones, cartilage, tendons, and muscles of your hip joint perform a lot of work each day supporting your body weight and allowing you to move around. Hip pain can range from a minor ache to severe pain in one or both of your hips. Pain may be felt on the inside of the hip joint near the groin, or the outside near the buttocks and upper thigh. You may have swelling or stiffness as well.  HOME CARE INSTRUCTIONS   Take medicines only as directed by your health care provider.  Apply ice to the injured area:  Put ice in a plastic bag.  Place a towel between your skin and the bag.  Leave the ice on for 15-20 minutes at a time, 3-4 times a day.  Keep your leg raised (elevated) when possible to lessen swelling.  Avoid activities that cause pain.  Follow specific exercises as directed by your health care provider.  Sleep with a pillow between your legs on your most comfortable side.  Record how often you have hip pain, the location of the pain, and what it feels like. SEEK MEDICAL CARE IF:   You are unable to put weight on your leg.  Your hip is red or swollen or very tender to touch.  Your pain or swelling continues or worsens after 1 week.  You have increasing difficulty walking.  You have a fever. SEEK IMMEDIATE MEDICAL CARE IF:   You have fallen.  You have a sudden increase in pain and swelling in your hip. MAKE SURE YOU:   Understand these instructions.  Will watch your condition.  Will get help right away if you are not doing well or get worse. Document Released: 03/07/2010 Document Revised: 02/01/2014 Document Reviewed: 05/14/2013 [facility] Patient Information 2015 [place], [place]. This information is not intended to replace advice given to you by your health care  provider. Make sure you discuss any questions you have with your health care provider.

## 2014-09-26 NOTE — ED Notes (Signed)
He c/o lower back pain radiating down R hip and leg since yesterday. He took percocet with some relief but he ran out. He reports history of back pain and denies any new injuries. He is using old crutches to ambulate because he states it hurts too bad without them

## 2014-09-30 DIAGNOSIS — I2510 Atherosclerotic heart disease of native coronary artery without angina pectoris: Secondary | ICD-10-CM | POA: Diagnosis not present

## 2014-09-30 DIAGNOSIS — K219 Gastro-esophageal reflux disease without esophagitis: Secondary | ICD-10-CM | POA: Diagnosis not present

## 2014-09-30 DIAGNOSIS — R200 Anesthesia of skin: Secondary | ICD-10-CM | POA: Diagnosis not present

## 2014-09-30 DIAGNOSIS — Z940 Kidney transplant status: Secondary | ICD-10-CM | POA: Diagnosis not present

## 2014-09-30 DIAGNOSIS — E785 Hyperlipidemia, unspecified: Secondary | ICD-10-CM | POA: Diagnosis not present

## 2014-09-30 DIAGNOSIS — I10 Essential (primary) hypertension: Secondary | ICD-10-CM | POA: Diagnosis not present

## 2014-09-30 DIAGNOSIS — E119 Type 2 diabetes mellitus without complications: Secondary | ICD-10-CM | POA: Diagnosis not present

## 2014-09-30 DIAGNOSIS — F419 Anxiety disorder, unspecified: Secondary | ICD-10-CM | POA: Diagnosis not present

## 2014-09-30 DIAGNOSIS — F17210 Nicotine dependence, cigarettes, uncomplicated: Secondary | ICD-10-CM | POA: Diagnosis not present

## 2014-09-30 DIAGNOSIS — M545 Low back pain: Secondary | ICD-10-CM | POA: Diagnosis not present

## 2014-09-30 DIAGNOSIS — G8929 Other chronic pain: Secondary | ICD-10-CM | POA: Diagnosis not present

## 2014-10-14 DIAGNOSIS — M4807 Spinal stenosis, lumbosacral region: Secondary | ICD-10-CM | POA: Diagnosis not present

## 2014-10-14 DIAGNOSIS — M5136 Other intervertebral disc degeneration, lumbar region: Secondary | ICD-10-CM | POA: Diagnosis not present

## 2014-10-16 DIAGNOSIS — M5136 Other intervertebral disc degeneration, lumbar region: Secondary | ICD-10-CM | POA: Diagnosis not present

## 2014-10-16 DIAGNOSIS — M4807 Spinal stenosis, lumbosacral region: Secondary | ICD-10-CM | POA: Diagnosis not present

## 2014-10-16 DIAGNOSIS — Z720 Tobacco use: Secondary | ICD-10-CM | POA: Diagnosis not present

## 2014-10-26 ENCOUNTER — Ambulatory Visit: Payer: Medicare Other | Attending: Physical Medicine and Rehabilitation | Admitting: Physical Therapy

## 2014-10-26 ENCOUNTER — Encounter: Payer: Self-pay | Admitting: Physical Therapy

## 2014-10-26 DIAGNOSIS — R262 Difficulty in walking, not elsewhere classified: Secondary | ICD-10-CM | POA: Diagnosis not present

## 2014-10-26 DIAGNOSIS — M4806 Spinal stenosis, lumbar region: Secondary | ICD-10-CM | POA: Insufficient documentation

## 2014-10-26 DIAGNOSIS — M5136 Other intervertebral disc degeneration, lumbar region: Secondary | ICD-10-CM | POA: Diagnosis not present

## 2014-10-26 DIAGNOSIS — M48061 Spinal stenosis, lumbar region without neurogenic claudication: Secondary | ICD-10-CM

## 2014-10-26 NOTE — Therapy (Signed)
[place], [place], [address] Phone: [phone]   Fax:  [phone]  Physical Therapy Evaluation  Patient Details  Name: Theodore Arellano MRN: [id number] Date of Birth: 03-28-1961 Referring Provider:  Cindee Salt, MD  Encounter Date: 10/26/2014      PT End of Session - 10/26/14 1029    Visit Number 1   Number of Visits 12   Date for PT Re-Evaluation 12/21/14   PT Start Time 0845   PT Stop Time 0930   PT Time Calculation (min) 45 min   Activity Tolerance Patient tolerated treatment well      Past Medical History  Diagnosis Date  . Hypertension   . Hyperlipidemia   . Kidney disease     s/p kidney transplant 2011    Past Surgical History  Procedure Laterality Date  . Kidney transplant  2011  . Hd access procedures    . Right heart catheterization N/A 03/11/2012    Procedure: RIGHT HEART CATH;  Surgeon: Jolaine Artist, MD;  Location: [facility] CATH LAB;  Service: Cardiovascular;  Laterality: N/A;  . Left heart catheterization with coronary angiogram  03/11/2012    Procedure: LEFT HEART CATHETERIZATION WITH CORONARY ANGIOGRAM;  Surgeon: Jolaine Artist, MD;  Location: [facility] CATH LAB;  Service: Cardiovascular;;    There were no vitals taken for this visit.  Visit Diagnosis:  Spinal stenosis of lumbar region  Degenerative disc disease, lumbar  Difficulty in walking      Subjective Assessment - 10/26/14 0851    Symptoms Patient presetns with chronic LBP and Rt. hip pain, Rt. knee pain.  Reports Rt. LE numbness (close to knee), Rt. LE weakness.  He was diagnosed with lumbar stenosis 2 weeks go.     Pertinent History kidney disease with transplant 2011, HTN   Limitations Walking;Standing;Sitting;Lifting;House hold activities   How long can you sit comfortably? 15-20 min    How long can you stand comfortably? 10 min    How long can you walk comfortably? 10 min   Diagnostic tests XR    Patient  Stated Goals be able to exercise to keep this weight off, sleep better   Currently in Pain? Yes   Pain Score 6    Pain Location Back   Pain Orientation Right;Lower   Pain Descriptors / Indicators Aching   Pain Type Chronic pain   Pain Radiating Towards Rt. 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)   Multiple Pain Sites Yes   Pain Score 6   Pain Type Chronic pain   Pain Location Knee   Pain Orientation Right;Medial   Pain Descriptors / Indicators Aching;Numbness   Pain Frequency Intermittent   Pain Onset On-going          [facility] PT Assessment - 10/26/14 0857    Assessment   Medical Diagnosis lumbar stenosis  DDD, hip pain    Onset Date 09/24/14  chronic, severe   Next MD Visit 1 mo   Prior Therapy No   Precautions   Precautions None   Restrictions   Weight Bearing Restrictions No   Balance Screen   Has the patient fallen in the past 6 months No   Has the patient had  a decrease in activity level because of a fear of falling?  No   Is the patient reluctant to leave their home because of a fear of falling?  No   Home Environment   Living Enviornment Private residence   Living Arrangements Parent   Type of [place] Access Level entry   Prior Function   Level of [place] with basic ADLs   Cognition   Overall Cognitive Status Within Functional Limits for tasks assessed   Observation/Other Assessments   Observations shifts weight in sitting frequently   Focus on Therapeutic Outcomes (FOTO)  60% goal 40%   Sensation   Light Touch Appears Intact   Posture/Postural Control   Postural Limitations Decreased lumbar lordosis;Posterior pelvic tilt   Posture Comments hips shifted FW (swayback) and hips ER   AROM   Lumbar Flexion 2  inches from toes no pain   Lumbar Extension lacks 25%   Lumbar - Right Side Bend WNL   Lumbar - Left Side Bend WNL   Lumbar - Right Rotation WNL   Lumbar - Left Rotation WNL    PROM   Overall PROM  --  hips tight in ER with knees extended, hamstrings to 70 deg B   Strength   Right Hip Flexion 4+/5   Right Hip Extension 3-/5   Right Hip ABduction 3-/5   Left Hip Flexion 4+/5   Left Hip Extension 3/5   Left Hip ABduction 3+/5   Right Knee Flexion 4+/5   Right Knee Extension 5/5   Left Knee Flexion 5/5   Left Knee Extension 5/5     No pain with palpation of LS spine         [facility] Adult PT Treatment/Exercise - 10/26/14 0913    Lumbar Exercises: Stretches   Active Hamstring Stretch 1 rep;30 seconds   Single Knee to Chest Stretch 2 reps;30 seconds   Lower Trunk Rotation 5 reps;20 seconds     Patient reports increased pain on ant aspect of Rt. Thigh, possibly due to site of old port for dialysis, with demo of these stretches.  Explained possible scar tissue in the area, asked to decrease intensity of stretching if pain increases further.       PT Education - 10/26/14 1029    Education provided Yes   Education Details PT/POC, stenosis and HEP   Person(s) Educated Patient   Methods Explanation;Demonstration;Handout   Comprehension Verbalized understanding;Returned demonstration;Need further instruction          PT Short Term Goals - 10/26/14 1034    PT SHORT TERM GOAL #1   Title Pt will be I with initial HEP   Time 3   Period Weeks   Status New   PT SHORT TERM GOAL #2   Title Pt. will be able to stand for up to 30 min with mod pain in low back    Baseline severe   Time 3   Period Weeks   Status New   PT SHORT TERM GOAL #3   Title Pt will be able to walk with 25% less pain, difficulty for short periods.    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 LE with sensory sx.  His Rt. hip is painful, denies current L hip pain (as stated on Rx).  He will benefit from skilled PT to improve functional mobility and overall  standing/activitiy tolerance.    Pt will benefit from skilled therapeutic intervention in order to improve on the following deficits Decreased range of motion;Difficulty walking;Impaired flexibility;Postural dysfunction;Impaired sensation;Decreased endurance;Decreased activity tolerance;Increased fascial restricitons;Pain;Decreased mobility;Decreased strength   Rehab Potential Good   PT Frequency 2x / week   PT Duration 6 weeks   PT Treatment/Interventions ADLs/Self Care Home Management;Moist Heat;Therapeutic activities;Patient/family education;Passive range of motion;Therapeutic exercise;Traction;Ultrasound;Gait training;Manual techniques;Neuromuscular re-education;Cryotherapy;Electrical Stimulation;Functional mobility training   PT Next Visit Plan review initial HEP   PT Home Exercise Plan trunk rot, single knee to chest and hamstring stretch with sheet   Consulted and Agree with Plan of Care  Patient          G-Codes - 2014-11-09 0940    Functional Assessment Tool Used FOTO   Functional Limitation Changing and maintaining body position   Changing and Maintaining Body Position Current Status [id number]) At least 40 percent but less than 60 percent impaired, limited or restricted   Changing and Maintaining Body Position Goal Status [id number]) At least 20 percent but less than 40 percent impaired, limited or restricted       Problem List Patient Active Problem List   Diagnosis Date Noted  . Left groin pain 06/24/2013    PAA,JENNIFER November 09, 2014, 10:56 AM  [place] [facility] [place], [place], [address] Phone: [phone]   Fax:  [phone]

## 2014-10-26 NOTE — Patient Instructions (Signed)
Low Back Stretch: One leg (Supine)   Lying on back, bring one knee toward chest by pulling gently behind knee. Hold ____ seconds. Repeat with other leg.  Copyright  VHI. All rights reserved.  Hamstring Stretch   With other leg bent, foot flat, grasp right leg and slowly try to straighten knee. Hold ____ seconds. Repeat ____ times. Do ____ sessions per day.  http://gt2.exer.us/279   Copyright  VHI. All rights reserved.  Lumbar Rotation (Non-Weight Bearing)   Feet on floor, slowly rock knees from side to side in small, pain-free range of motion. Allow lower back to rotate slightly. Repeat ____ times per set. Do ____ sets per session. Do ____ sessions per day.  http://orth.exer.us/160   Copyright  VHI. All rights reserved.

## 2014-10-28 ENCOUNTER — Ambulatory Visit: Payer: Medicare Other | Admitting: Physical Therapy

## 2014-10-28 DIAGNOSIS — M48061 Spinal stenosis, lumbar region without neurogenic claudication: Secondary | ICD-10-CM

## 2014-10-28 DIAGNOSIS — M5136 Other intervertebral disc degeneration, lumbar region: Secondary | ICD-10-CM | POA: Diagnosis not present

## 2014-10-28 DIAGNOSIS — M4806 Spinal stenosis, lumbar region: Secondary | ICD-10-CM | POA: Diagnosis not present

## 2014-10-28 DIAGNOSIS — R262 Difficulty in walking, not elsewhere classified: Secondary | ICD-10-CM | POA: Diagnosis not present

## 2014-10-28 NOTE — Therapy (Signed)
[place], [place], [address] Phone: [phone]   Fax:  [phone]  Physical Therapy Treatment  Patient Details  Name: Theodore Arellano MRN: [id number] Date of Birth: 1960-12-31 Referring Provider:  Tamsen Roers, MD  Encounter Date: 10/28/2014      PT End of Session - 10/28/14 1729    Visit Number 2   Number of Visits 12   Date for PT Re-Evaluation 12/21/14   PT Start Time 6644   PT Stop Time 1640   PT Time Calculation (min) 55 min   Activity Tolerance Patient tolerated treatment well;Patient limited by pain      Past Medical History  Diagnosis Date  . Hypertension   . Hyperlipidemia   . Kidney disease     s/p kidney transplant 2011    Past Surgical History  Procedure Laterality Date  . Kidney transplant  2011  . Hd access procedures    . Right heart catheterization N/A 03/11/2012    Procedure: RIGHT HEART CATH;  Surgeon: Jolaine Artist, MD;  Location: [facility] CATH LAB;  Service: Cardiovascular;  Laterality: N/A;  . Left heart catheterization with coronary angiogram  03/11/2012    Procedure: LEFT HEART CATHETERIZATION WITH CORONARY ANGIOGRAM;  Surgeon: Jolaine Artist, MD;  Location: [facility] CATH LAB;  Service: Cardiovascular;;    There were no vitals taken for this visit.  Visit Diagnosis:  Spinal stenosis of lumbar region  Degenerative disc disease, lumbar      Subjective Assessment - 10/28/14 1544    Symptoms 7/10 Rt low back, aching, medial thigh hyper sensitive., at knee, worse with winter weather. better with getting off back, worse with standing too long or walking                    [facility] Adult PT Treatment/Exercise - 10/28/14 1601    Lumbar Exercises: Stretches   Active Hamstring Stretch 1 rep  30 seconds sheet used   Pelvic Tilt 5 reps  small motions   Piriformis Stretch --  3 reps 30 seconds.   Lumbar Exercises: Sidelying   Clam --  10 reps(5X2)   Other  Sidelying Lumbar Exercises Book opening 5 X2   Moist Heat Therapy   Number Minutes Moist Heat 9 Minutes   Moist Heat Location --  Back, patient wanted off the heat early     Also performed knee to chest 5 reps each 5 second hold. Sitting able to touch the floor, 3 reps 10-15 seconds each.           PT Education - 10/28/14 1728    Education provided Yes   Education Details spinal stenosis information fron education section of EPIC   Person(s) Educated Patient   Methods Explanation;Handout   Comprehension Verbalized understanding          PT Short Term Goals - 10/28/14 1731    PT SHORT TERM GOAL #1   Title Pt will be I with initial HEP   Time 3   Period Weeks   Status Achieved   PT SHORT TERM GOAL #2   Title Pt. will be able to stand for up to 30 min with mod pain in low back    Time 3   Period Weeks   Status On-going   PT SHORT TERM GOAL #3   Title Pt will be able to walk with 25% less pain, difficulty for short periods.    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 reason.  No  [place]   PT Next Visit Plan Add dead bug to home exercises, try recumbant bike        Problem List Patient Active Problem List   Diagnosis Date Noted  . Left groin pain 06/24/2013  Melvenia Needles, PTA 10/28/2014 5:36 PM Phone: [phone] Fax: [phone]   [facility] 10/28/2014, 5:36 PM  [place], [place], [address] Phone: [phone]   Fax:  [phone]

## 2014-11-02 ENCOUNTER — Ambulatory Visit: Payer: Medicare Other | Attending: Physical Medicine and Rehabilitation | Admitting: Physical Therapy

## 2014-11-02 DIAGNOSIS — M4806 Spinal stenosis, lumbar region: Secondary | ICD-10-CM | POA: Insufficient documentation

## 2014-11-02 DIAGNOSIS — M5136 Other intervertebral disc degeneration, lumbar region: Secondary | ICD-10-CM | POA: Insufficient documentation

## 2014-11-02 DIAGNOSIS — R262 Difficulty in walking, not elsewhere classified: Secondary | ICD-10-CM | POA: Insufficient documentation

## 2014-11-03 ENCOUNTER — Telehealth: Payer: Self-pay | Admitting: *Deleted

## 2014-11-03 NOTE — Telephone Encounter (Signed)
pt came by stating that after his appt on 11/04/14 he's going to put his visits on hold due to income and transportation...td

## 2014-11-04 ENCOUNTER — Ambulatory Visit: Payer: Medicare Other | Admitting: Physical Therapy

## 2014-11-04 DIAGNOSIS — M5136 Other intervertebral disc degeneration, lumbar region: Secondary | ICD-10-CM

## 2014-11-04 DIAGNOSIS — R262 Difficulty in walking, not elsewhere classified: Secondary | ICD-10-CM

## 2014-11-04 DIAGNOSIS — M4806 Spinal stenosis, lumbar region: Secondary | ICD-10-CM | POA: Diagnosis not present

## 2014-11-04 DIAGNOSIS — M48061 Spinal stenosis, lumbar region without neurogenic claudication: Secondary | ICD-10-CM

## 2014-11-04 NOTE — Therapy (Signed)
[place], [place], [address] Phone: [phone]   Fax:  [phone]  Physical Therapy Treatment  Patient Details  Name: Theodore Arellano MRN: [id number] Date of Birth: 1961-02-14 Referring Provider:  Tamsen Roers, MD  Encounter Date: 11/04/2014      PT End of Session - 11/04/14 1145    Visit Number 3   Number of Visits 12   Date for PT Re-Evaluation 12/21/14   PT Start Time 1105   PT Stop Time 1145   PT Time Calculation (min) 40 min   Activity Tolerance Patient tolerated treatment well;Patient limited by pain      Past Medical History  Diagnosis Date  . Hypertension   . Hyperlipidemia   . Kidney disease     s/p kidney transplant 2011    Past Surgical History  Procedure Laterality Date  . Kidney transplant  2011  . Hd access procedures    . Right heart catheterization N/A 03/11/2012    Procedure: RIGHT HEART CATH;  Surgeon: Jolaine Artist, MD;  Location: [facility] CATH LAB;  Service: Cardiovascular;  Laterality: N/A;  . Left heart catheterization with coronary angiogram  03/11/2012    Procedure: LEFT HEART CATHETERIZATION WITH CORONARY ANGIOGRAM;  Surgeon: Jolaine Artist, MD;  Location: [facility] CATH LAB;  Service: Cardiovascular;;    There were no vitals taken for this visit.  Visit Diagnosis:  Spinal stenosis of lumbar region  Degenerative disc disease, lumbar  Difficulty in walking      Subjective Assessment - 11/04/14 1107    Symptoms 7/10  Numb into Rt knee.   Pain Score 7   No pain with sitting   Pain Orientation Right;Lower   Pain Descriptors / Indicators Aching;Numbness  muscle twitches, not new   Pain Radiating Towards Knee   Pain Frequency Intermittent   Aggravating Factors  walking, standing   Pain Relieving Factors bending forward, lying down   Multiple Pain Sites Yes                    [facility] Adult PT Treatment/Exercise - 11/04/14 1111    Ambulation/Gait   Ambulation/Gait --  added home walking program today/   Gait Comments 6 minute walk test pain 6/10  989.4 feet   Lumbar Exercises: Stretches   Passive Hamstring Stretch 3 reps;30 seconds   Single Knee to Chest Stretch 5 reps;10 seconds   Double Knee to Chest Stretch --  10 reps feet  on orange ball   Lower Trunk Rotation 5 reps   Lower Trunk Rotation Limitations --  contact guad on ball   Lumbar Exercises: Supine   Bridge Limitations --  feet over ball 10 reps   Other Supine Lumbar Exercises sitting trunk flexion                PT Education - 11/04/14 1144    Education provided Yes   Education Details home walking program issued from drawer   Person(s) Educated Patient   Methods Explanation;Handout   Comprehension Verbalized understanding          PT Short Term Goals - 11/04/14 1147    PT SHORT TERM GOAL #2   Title Pt. will be able to stand for up to 30 min with mod pain in low back    Time 3   Period Weeks   Status Unable to assess   PT SHORT TERM GOAL #3   Title Pt will be able to walk  with 25% less pain, difficulty for short periods.    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  No heat today.   Stretching and exercise, today.  Pain with walking 6/10 during 6 minute walk test   PT Next Visit Plan assess walking program, try bike, stabilization        Problem List Patient Active Problem List   Diagnosis Date Noted  . Left groin pain 06/24/2013   Melvenia Needles, PTA 11/04/2014 11:49 AM Phone: [phone] Fax: [phone]  [facility] 11/04/2014, 11:49 AM  [facility] [address] [place], [place], [address] Phone: [phone]   Fax:  [phone]

## 2014-11-05 DIAGNOSIS — J4520 Mild intermittent asthma, uncomplicated: Secondary | ICD-10-CM | POA: Diagnosis not present

## 2014-11-05 DIAGNOSIS — F17210 Nicotine dependence, cigarettes, uncomplicated: Secondary | ICD-10-CM | POA: Diagnosis not present

## 2014-11-05 DIAGNOSIS — G4733 Obstructive sleep apnea (adult) (pediatric): Secondary | ICD-10-CM | POA: Diagnosis not present

## 2014-11-09 ENCOUNTER — Encounter: Payer: Medicare Other | Admitting: Physical Therapy

## 2014-11-09 DIAGNOSIS — N189 Chronic kidney disease, unspecified: Secondary | ICD-10-CM | POA: Diagnosis not present

## 2014-11-09 DIAGNOSIS — N185 Chronic kidney disease, stage 5: Secondary | ICD-10-CM | POA: Diagnosis not present

## 2014-11-09 DIAGNOSIS — Z940 Kidney transplant status: Secondary | ICD-10-CM | POA: Diagnosis not present

## 2014-11-09 DIAGNOSIS — N2581 Secondary hyperparathyroidism of renal origin: Secondary | ICD-10-CM | POA: Diagnosis not present

## 2014-11-09 DIAGNOSIS — E1129 Type 2 diabetes mellitus with other diabetic kidney complication: Secondary | ICD-10-CM | POA: Diagnosis not present

## 2014-11-09 DIAGNOSIS — D631 Anemia in chronic kidney disease: Secondary | ICD-10-CM | POA: Diagnosis not present

## 2014-11-09 DIAGNOSIS — E785 Hyperlipidemia, unspecified: Secondary | ICD-10-CM | POA: Diagnosis not present

## 2014-11-11 ENCOUNTER — Encounter: Payer: Medicare Other | Admitting: Physical Therapy

## 2014-11-15 ENCOUNTER — Ambulatory Visit: Payer: Medicare Other | Admitting: Physical Therapy

## 2014-11-16 ENCOUNTER — Ambulatory Visit: Payer: Medicare Other | Admitting: Physical Therapy

## 2014-11-22 ENCOUNTER — Ambulatory Visit: Payer: Medicare Other | Admitting: Physical Therapy

## 2014-11-23 ENCOUNTER — Ambulatory Visit: Payer: Medicare Other | Admitting: Physical Therapy

## 2014-11-23 DIAGNOSIS — M4806 Spinal stenosis, lumbar region: Secondary | ICD-10-CM | POA: Diagnosis not present

## 2014-11-23 DIAGNOSIS — R262 Difficulty in walking, not elsewhere classified: Secondary | ICD-10-CM

## 2014-11-23 DIAGNOSIS — M5136 Other intervertebral disc degeneration, lumbar region: Secondary | ICD-10-CM | POA: Diagnosis not present

## 2014-11-23 DIAGNOSIS — M48061 Spinal stenosis, lumbar region without neurogenic claudication: Secondary | ICD-10-CM

## 2014-11-23 NOTE — Therapy (Signed)
[place], [place], [address] Phone: [phone]   Fax:  [phone]  Physical Therapy Treatment  Patient Details  Name: Theodore Arellano MRN: [id number] Date of Birth: [birth year] Referring Provider:  Tamsen Roers, MD  Encounter Date: 11/23/2014      PT End of Session - 11/23/14 1115    Visit Number 4   Number of Visits 12   Date for PT Re-Evaluation 12/21/14   PT Start Time [id number]   PT Stop Time 1120   PT Time Calculation (min) 65 min   Activity Tolerance Patient tolerated treatment well;Patient limited by pain      Past Medical History  Diagnosis Date  . Hypertension   . Hyperlipidemia   . Kidney disease     s/p kidney transplant 2011    Past Surgical History  Procedure Laterality Date  . Kidney transplant  2011  . Hd access procedures    . Right heart catheterization N/A 03/11/2012    Procedure: RIGHT HEART CATH;  Surgeon: Jolaine Artist, MD;  Location: [facility] CATH LAB;  Service: Cardiovascular;  Laterality: N/A;  . Left heart catheterization with coronary angiogram  03/11/2012    Procedure: LEFT HEART CATHETERIZATION WITH CORONARY ANGIOGRAM;  Surgeon: Jolaine Artist, MD;  Location: [facility] CATH LAB;  Service: Cardiovascular;;    There were no vitals taken for this visit.  Visit Diagnosis:  Difficulty in walking  Spinal stenosis of lumbar region      Subjective Assessment - 11/23/14 1021    Symptoms Feels OK, back is weak.  Walks 5 minutes at a time for walking program, shoes make a difference.  Rt leg burns to the knee.  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 Yes   Pain Score 4   goes up to 10   Pain Location Back   Pain Orientation Right;Lower   Pain Descriptors / Indicators Radiating;Numbness;Burning   Pain Radiating Towards knee, Rt Thigh, Had some lt side pain 1 time.   Pain Frequency Intermittent   Aggravating Factors  walking standing ,  from time to time sitting, lifting heavy items.   Pain Relieving Factors sitting, tylenol   Multiple Pain Sites No                    [facility] Adult PT Treatment/Exercise - 11/23/14 1033    Lumbar Exercises: Stretches   Active Hamstring Stretch 1 rep;30 seconds   Active Hamstring Stretch Limitations added 2 sets of SLR with these.   Single Knee to Chest Stretch 3 reps;10 seconds  each   Quadruped Mid Back Stretch 5 reps;10 seconds   Lumbar Exercises: Machines for Strengthening   Cybex Knee Flexion 2 plates 10 reps cues   Leg Press 2 legs, cues 1 plate 10 reps   Lumbar Exercises: Supine   Clam 5 reps;5 seconds  using belt, cues , bracing   Bent Knee Raise 5 reps   Straight Leg Raise 10 reps  with hamstring stretch, belt   Lumbar Exercises: Sidelying   Clam 10 reps;5 reps  each with cues   Hip Abduction --  Rt 4-/5, Lt 5/5    Hip Abduction Limitations 5/5 Lt, 4-/5   Lumbar Exercises: Quadruped   [place]cat/Old Horse 5 reps  cat emphasis   Straight Leg Raises Limitations --  painful Rt 1 rep   Moist Heat Therapy   Number Minutes Moist Heat 15  Minutes   Moist Heat Location --  back, sitting   Ankle Exercises: Stretches   Soleus Stretch 3 reps;30 seconds  each   Gastroc Stretch 3 reps;30 seconds  each                  PT Short Term Goals - 11/23/14 1034    PT SHORT TERM GOAL #2   Title Pt. will be able to stand for up to 30 min with mod pain in low back    Baseline tolerates 7 minutes at moderate.    Period Weeks   Status On-going   PT SHORT TERM GOAL #3   Title Pt will be able to walk with 25% less pain, difficulty for short periods.    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   PT Next Visit Plan Patient likes working on machines try treadmill will need short duration due to endurance.  continue stabilization        Problem List Patient Active Problem List   Diagnosis Date Noted  . Left groin pain 06/24/2013  Melvenia Needles, PTA 11/23/2014 11:18 AM Phone: [phone] Fax: [phone]   [facility] 11/23/2014, 11:18 AM  [facility] [address] [place], [place], [address] Phone: [phone]   Fax:  [phone]

## 2014-11-30 ENCOUNTER — Ambulatory Visit: Payer: Medicare Other | Attending: Physical Medicine and Rehabilitation | Admitting: Physical Therapy

## 2014-11-30 DIAGNOSIS — M5136 Other intervertebral disc degeneration, lumbar region: Secondary | ICD-10-CM | POA: Diagnosis not present

## 2014-11-30 DIAGNOSIS — M4806 Spinal stenosis, lumbar region: Secondary | ICD-10-CM | POA: Diagnosis not present

## 2014-11-30 DIAGNOSIS — R262 Difficulty in walking, not elsewhere classified: Secondary | ICD-10-CM | POA: Diagnosis not present

## 2014-11-30 NOTE — Patient Instructions (Addendum)
Sitting posture practiced Sleeping on Back  Place pillow under knees. A pillow with cervical support and a roll around waist are also helpful. Copyright  VHI. All rights reserved.  Sleeping on Side Place pillow between knees. Use cervical support under neck and a roll around waist as needed. Copyright  VHI. All rights reserved.   Sleeping on Stomach   If this is the only desirable sleeping position, place pillow under lower legs, and under stomach or chest as needed.  Posture - Sitting   Sit upright, head facing forward. Try using a roll to support lower back. Keep shoulders relaxed, and avoid rounded back. Keep hips level with knees. Avoid crossing legs for long periods. Stand to Sit / Sit to Stand   To sit: Bend knees to lower self onto front edge of chair, then scoot back on seat. To stand: Reverse sequence by placing one foot forward, and scoot to front of seat. Use rocking motion to stand up.   Work Height and Reach  Ideal work height is no more than 2 to 4 inches below elbow level when standing, and at elbow level when sitting. Reaching should be limited to arm's length, with elbows slightly bent.  Bending  Bend at hips and knees, not back. Keep feet shoulder-width apart.    Posture - Standing   Good posture is important. Avoid slouching and forward head thrust. Maintain curve in low back and align ears over shoul- ders, hips over ankles.  Alternating Positions   Alternate tasks and change positions frequently to reduce fatigue and muscle tension. Take rest breaks. Computer Work   Position work to Programmer, multimedia. Use proper work and seat height. Keep shoulders back and down, wrists straight, and elbows at right angles. Use chair that provides full back support. Add footrest and lumbar roll as needed.  Getting Into / Out of Car  Lower self onto seat, scoot back, then bring in one leg at a time. Reverse sequence to get out.  Dressing  Lie on back to pull socks  or slacks over feet, or sit and bend leg while keeping back straight.    Housework - Sink  Place one foot on ledge of cabinet under sink when standing at sink for prolonged periods.   Pushing / Pulling  Pushing is preferable to pulling. Keep back in proper alignment, and use leg muscles to do the work.  Deep Squat   Squat and lift with both arms held against upper trunk. Tighten stomach muscles without holding breath. Use smooth movements to avoid jerking.  Avoid Twisting   Avoid twisting or bending back. Pivot around using foot movements, and bend at knees if needed when reaching for articles.  Carrying Luggage   Distribute weight evenly on both sides. Use a cart whenever possible. Do not twist trunk. Move body as a unit.   Lifting Principles .Maintain proper posture and head alignment. .Slide object as close as possible before lifting. .Move obstacles out of the way. .Test before lifting; ask for help if too heavy. .Tighten stomach muscles without holding breath. .Use smooth movements; do not jerk. .Use legs to do the work, and pivot with feet. .Distribute the work load symmetrically and close to the center of trunk. .Push instead of pull whenever possible.   Ask For Help   Ask for help and delegate to others when possible. Coordinate your movements when lifting together, and maintain the low back curve.  Log Roll   Lying on back, bend left knee  and place left arm across chest. Roll all in one movement to the right. Reverse to roll to the left. Always move as one unit. Housework - Sweeping  Use long-handled equipment to avoid stooping.   Housework - Wiping  Position yourself as close as possible to reach work surface. Avoid straining your back.  Laundry - Unloading Wash   To unload small items at bottom of washer, lift leg opposite to arm being used to reach.  [place] close to area to be raked. Use arm movements to do the work. Keep  back straight and avoid twisting.     Cart  When reaching into cart with one arm, lift opposite leg to keep back straight.   Getting Into / Out of Bed  Lower self to lie down on one side by raising legs and lowering head at the same time. Use arms to assist moving without twisting. Bend both knees to roll onto back if desired. To sit up, start from lying on side, and use same move-ments in reverse. Housework - Vacuuming  Hold the vacuum with arm held at side. Step back and forth to move it, keeping head up. Avoid twisting.   Laundry - IT consultant so that bending and twisting can be avoided.   Laundry - Unloading Dryer  Squat down to reach into clothes dryer or use a reacher.  Gardening - Weeding / Probation officer or Kneel. Knee pads may be helpful.                  Use ice at home . Expect soreness in muscles over the next day or two

## 2014-11-30 NOTE — Therapy (Signed)
[place], [place], [address] Phone: [phone]   Fax:  [phone]  Physical Therapy Treatment  Patient Details  Name: Theodore Arellano MRN: [id number] Date of Birth: [birth year] Referring Provider:  Tamsen Roers, MD  Encounter Date: 11/30/2014      PT End of Session - 11/30/14 1143    Visit Number 5   Number of Visits 12   Date for PT Re-Evaluation 12/21/14   PT Start Time 1100   PT Stop Time 1144   PT Time Calculation (min) 44 min   Activity Tolerance Patient tolerated treatment well;Patient limited by pain      Past Medical History  Diagnosis Date  . Hypertension   . Hyperlipidemia   . Kidney disease     s/p kidney transplant 2011    Past Surgical History  Procedure Laterality Date  . Kidney transplant  2011  . Hd access procedures    . Right heart catheterization N/A 03/11/2012    Procedure: RIGHT HEART CATH;  Surgeon: Jolaine Artist, MD;  Location: [facility] CATH LAB;  Service: Cardiovascular;  Laterality: N/A;  . Left heart catheterization with coronary angiogram  03/11/2012    Procedure: LEFT HEART CATHETERIZATION WITH CORONARY ANGIOGRAM;  Surgeon: Jolaine Artist, MD;  Location: [facility] CATH LAB;  Service: Cardiovascular;;    There were no vitals taken for this visit.  Visit Diagnosis:  Difficulty in walking      Subjective Assessment - 11/30/14 1114    Symptoms Has not followed through with walking this week.  Continues to have the new Rt leg pain.  (Twitches)  and Lt leg pain intermittantly  4/10 ached,  feels it in his Lt hip.  none now.  Worse with sitting   Currently in Pain? Yes  See information above   Pain Score 4                     [facility] Adult PT Treatment/Exercise - 11/30/14 1127    Ambulation/Gait   Gait Comments treadmill 5 minutes no pain, with hands removed from rails pain returned to 6-7/10   Lumbar Exercises: Machines for Strengthening   Cybex Knee  Extension 2 plates  8 reps. tired vs pain.  1/2 range, last 45 degrees.  (small    Cybex Knee Flexion 3 plates   10 reps   Leg Press 2 legs 20 reps   Other Lumbar Machine Exercise hip cybex hip abduction , 1 plate 8 reps RT, 7 reps Lt, cues for leg position,  fatigues quickly.     Lumbar Exercises: Standing   Wall Slides 10 reps  3 second hols, fatigued   Knee/Hip Exercises: Stretches   Gastroc Stretch 3 reps  30 seconds, cues position                  PT Short Term Goals - 11/30/14 1144    PT SHORT TERM GOAL #2   Status On-going   PT SHORT TERM GOAL #3   Status On-going           PT Long Term Goals - 11/30/14 1144    PT LONG TERM GOAL #1   Title Pt will be I with advanced HEP   Time 6   Period Weeks   Status On-going   PT LONG TERM GOAL #2   Title Pt will be able to demo and understand principles of posture and body mechanics as it relates to stenosis  Time 6   Period Weeks   Status On-going   PT LONG TERM GOAL #3   Title Pt will demo hip abd and ext strength to 4/5 or greater bilaterally   Time 6   Period Weeks   Status Unable to assess   PT LONG TERM GOAL #4   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  Use of machines today an eye opener for patient as he fatigued quickly.  Rt knee pain post exercise, declined ice.  No leg pain post exercise does report soreness in low back.   PT Next Visit Plan Patient likes working on machines try treadmill will need short duration due to endurance.  continue stabilization   Consulted and Agree with Plan of Care Patient        Problem List Patient Active Problem List   Diagnosis Date Noted  . Left groin pain 06/24/2013  Melvenia Needles, PTA 11/30/2014 11:45 AM Phone: [phone] Fax: [phone]    [facility] 11/30/2014, 11:45 AM  [facility] [address] [place], [place], [address] Phone: [phone]   Fax:  [phone]

## 2014-12-02 ENCOUNTER — Encounter: Payer: Medicare Other | Admitting: Physical Therapy

## 2014-12-07 ENCOUNTER — Ambulatory Visit: Payer: Medicare Other | Admitting: Physical Therapy

## 2014-12-07 DIAGNOSIS — M4806 Spinal stenosis, lumbar region: Secondary | ICD-10-CM | POA: Diagnosis not present

## 2014-12-07 DIAGNOSIS — M5136 Other intervertebral disc degeneration, lumbar region: Secondary | ICD-10-CM

## 2014-12-07 DIAGNOSIS — M48061 Spinal stenosis, lumbar region without neurogenic claudication: Secondary | ICD-10-CM

## 2014-12-07 DIAGNOSIS — R262 Difficulty in walking, not elsewhere classified: Secondary | ICD-10-CM

## 2014-12-07 NOTE — Therapy (Addendum)
[facility] [address] [place], [place], [address] Phone: [phone]   Fax:  [phone]  Physical Therapy Treatment/Discharge Note  Patient Details  Name: Theodore Arellano MRN: [id number] Date of Birth: Dec 24, 1960 Referring Provider:  Aida Puffer, MD  Encounter Date: 12/07/2014      PT End of Session - 12/07/14 1106    Visit Number 6   Number of Visits 12   Date for PT Re-Evaluation 12/21/14   PT Start Time 1100   PT Stop Time 1147   PT Time Calculation (min) 47 min   Activity Tolerance Patient tolerated treatment well   Behavior During Therapy [facility] for tasks assessed/performed      Past Medical History  Diagnosis Date  . Hypertension   . Hyperlipidemia   . Kidney disease     s/p kidney transplant 2011    Past Surgical History  Procedure Laterality Date  . Kidney transplant  2011  . Hd access procedures    . Right heart catheterization N/A 03/11/2012    Procedure: RIGHT HEART CATH;  Surgeon: Dolores Patty, MD;  Location: [facility] CATH LAB;  Service: Cardiovascular;  Laterality: N/A;  . Left heart catheterization with coronary angiogram  03/11/2012    Procedure: LEFT HEART CATHETERIZATION WITH CORONARY ANGIOGRAM;  Surgeon: Dolores Patty, MD;  Location: [facility] CATH LAB;  Service: Cardiovascular;;    There were no vitals taken for this visit.  Visit Diagnosis:  Difficulty in walking  Spinal stenosis of lumbar region  Degenerative disc disease, lumbar      Subjective Assessment - 12/07/14 1106    Symptoms Yesterday I was a 10/10 trying to do yardwork.  today pt is a 4/10. but reduces to 3/10 with movement and stretching   Currently in Pain? Yes   Pain Score 4    Pain Location Back   Pain Orientation Right;Left          [facility] PT Assessment - 12/07/14 0001    Ambulation/Gait   Gait Comments walk test pain 3/10 1180 ft.   6 Minute Walk- Baseline   6 Minute Walk- Baseline --                   [facility] Adult PT Treatment/Exercise - 12/07/14 0001    Lumbar Exercises: Stretches   Active Hamstring Stretch 2 reps;30 seconds  both with VC    Single Knee to Chest Stretch 3 reps;10 seconds  both sides with VC   Prone Mid Back Stretch 2 reps;30 seconds   Prone Mid Back Stretch Limitations Also to Right and Left x 2 for 30 seconds   Lumbar Exercises: Standing   Other Standing Lumbar Exercises standing extension 5 x   Pt relieved pain from 4/10 to 3/10 with extension movement   Lumbar Exercises: Supine   Ab Set 5 reps  10 sec each   Bent Knee Raise 5 reps;3 seconds  vc   Bridge 10 reps;3 seconds  x2   Bridge Limitations pt unable to do only 1/2 range   Straight Leg Raise 10 reps  x2 with VC for ab set   Manual Therapy   Manual Therapy Joint mobilization   Joint Mobilization L 2 to L5 with grade 3 and sacral PA Grade 4  pt with pain decreased to 3/10 after treatment                PT Education - 12/07/14 1141    Education provided Yes  Education Details home exercise program with mid back stretch and SLR and bridging and back extension every hour.   Person(s) Educated Patient   Methods Explanation;Handout;Demonstration   Comprehension Verbalized understanding;Returned demonstration;Need further instruction          PT Short Term Goals - 12/07/14 1145    PT SHORT TERM GOAL #1   Title Pt will be I with initial HEP   Time 3   Period Weeks   Status Achieved   PT SHORT TERM GOAL #2   Title Pt. will be able to stand for up to 30 min with mod pain in low back   15 - 20 minutes   Baseline tolerates 7 minutes at moderate.    Time 3   Period Weeks   Status On-going   PT SHORT TERM GOAL #3   Title Pt will be able to walk with 25% less pain, difficulty for short periods.   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  Pt enters clinic 4/10.  Pt able to increase 6 min walk test to 1180 ft. and pain reduces with joint mob and exercise to 3/10 after treatment.  Pt reviewed new home exercise to use and was encouraged to continue walking program   Pt will benefit from skilled therapeutic intervention in order to improve on the following deficits Decreased range of motion;Difficulty walking;Impaired flexibility;Postural dysfunction;Impaired sensation;Decreased endurance;Decreased activity tolerance;Increased fascial restricitons;Pain;Decreased mobility;Decreased strength   Rehab Potential Good   PT Frequency 2x / week   PT Treatment/Interventions ADLs/Self Care Home Management;Moist Heat;Therapeutic activities;Patient/family education;Passive range of motion;Therapeutic exercise;Traction;Ultrasound;Gait training;Manual techniques;Neuromuscular re-education;Cryotherapy;Electrical Stimulation;Functional mobility training   PT Next Visit Plan Pt needs to be consistent with home exercise program and walking. even though he has stenosis he responded well to extension in standing. Please assess extension exercise for next visit and  assess value in treating this pt for pain relief Please review homeprogram with ham stretch, mid back stretch, bridging, single knee to chest and trunk rotation.  Please assess goals.        Problem List Patient Active Problem  List   Diagnosis Date Noted  . Left groin pain 06/24/2013    Voncille Lo, PT 12/07/2014 12:14 PM Phone: [phone] Fax: [facility] [address] [place], [place], [address] Phone: [phone]   Fax:  [phone]     PHYSICAL THERAPY DISCHARGE SUMMARY  Visits from Start of Care: 6  Current functional level related to goals / functional outcomes: Unknown   Remaining deficits: unknown   Education / Equipment: Initial HEP Plan:                                                    Patient goals were partially met. Patient is being discharged due to not returning since the last visit.  ?????       Voncille Lo, PT 08/09/2015 10:06 AM Phone: [phone] Fax: [phone]

## 2014-12-07 NOTE — Patient Instructions (Addendum)
Mid-Back Stretch   Push chest toward floor, reaching forward as far as possible. Hold _30-60___ seconds. Repeat __2-3__ times per set. Do ___1_ sets per session. Do __1__ sessions per day. Also stretch to both sides as well  Voncille Lo, PT 12/07/2014 11:38 AM Phone: [phone] Fax: [phone]   http://orth.exer.us/130   Copyright  VHI. All rights reserved.  Straight Leg Raise   Tighten stomach and slowly raise locked right leg __6__ inches from floor. Repeat __10__ times per set. Do ___2_ sets per session. Do _1___ sessions per day.  http://orth.exer.us/1102   Copyright  VHI. All rights reserved.   Bridge   Lie back, legs bent. Inhale, pressing hips up. Keeping ribs in, lengthen lower back. Exhale, rolling down along spine from top. Repeat 2 x 10_ times. Hold for count of 5 seconds.  Do ___1_ sessions per day.  Copyright  VHI. All rights reserved.   Every hour try to extend back with hands on hip x 5 to reduce pain.

## 2014-12-09 ENCOUNTER — Ambulatory Visit: Payer: Medicare Other | Admitting: Physical Therapy

## 2014-12-09 DIAGNOSIS — Z940 Kidney transplant status: Secondary | ICD-10-CM | POA: Diagnosis not present

## 2014-12-09 DIAGNOSIS — N189 Chronic kidney disease, unspecified: Secondary | ICD-10-CM | POA: Diagnosis not present

## 2014-12-10 ENCOUNTER — Ambulatory Visit: Payer: Medicare Other | Admitting: Physical Therapy

## 2014-12-20 DIAGNOSIS — Z0000 Encounter for general adult medical examination without abnormal findings: Secondary | ICD-10-CM | POA: Diagnosis not present

## 2014-12-20 DIAGNOSIS — Z23 Encounter for immunization: Secondary | ICD-10-CM | POA: Diagnosis not present

## 2014-12-20 DIAGNOSIS — I10 Essential (primary) hypertension: Secondary | ICD-10-CM | POA: Diagnosis not present

## 2014-12-21 ENCOUNTER — Encounter: Payer: Medicare Other | Admitting: Physical Therapy

## 2014-12-23 ENCOUNTER — Encounter: Payer: Medicare Other | Admitting: Physical Therapy

## 2015-01-03 DIAGNOSIS — G4733 Obstructive sleep apnea (adult) (pediatric): Secondary | ICD-10-CM | POA: Diagnosis not present

## 2015-01-10 DIAGNOSIS — J4520 Mild intermittent asthma, uncomplicated: Secondary | ICD-10-CM | POA: Diagnosis not present

## 2015-01-10 DIAGNOSIS — G4733 Obstructive sleep apnea (adult) (pediatric): Secondary | ICD-10-CM | POA: Diagnosis not present

## 2015-01-10 DIAGNOSIS — R5383 Other fatigue: Secondary | ICD-10-CM | POA: Diagnosis not present

## 2015-01-10 DIAGNOSIS — F17210 Nicotine dependence, cigarettes, uncomplicated: Secondary | ICD-10-CM | POA: Diagnosis not present

## 2015-01-14 DIAGNOSIS — N189 Chronic kidney disease, unspecified: Secondary | ICD-10-CM | POA: Diagnosis not present

## 2015-01-14 DIAGNOSIS — N400 Enlarged prostate without lower urinary tract symptoms: Secondary | ICD-10-CM | POA: Diagnosis not present

## 2015-01-14 DIAGNOSIS — Z940 Kidney transplant status: Secondary | ICD-10-CM | POA: Diagnosis not present

## 2015-01-14 DIAGNOSIS — R312 Other microscopic hematuria: Secondary | ICD-10-CM | POA: Diagnosis not present

## 2015-01-14 DIAGNOSIS — Z79899 Other long term (current) drug therapy: Secondary | ICD-10-CM | POA: Diagnosis not present

## 2015-01-27 DIAGNOSIS — N400 Enlarged prostate without lower urinary tract symptoms: Secondary | ICD-10-CM | POA: Diagnosis not present

## 2015-02-02 ENCOUNTER — Other Ambulatory Visit (HOSPITAL_COMMUNITY): Payer: Self-pay | Admitting: Urology

## 2015-02-02 DIAGNOSIS — N281 Cyst of kidney, acquired: Secondary | ICD-10-CM

## 2015-02-02 DIAGNOSIS — N400 Enlarged prostate without lower urinary tract symptoms: Secondary | ICD-10-CM | POA: Diagnosis not present

## 2015-02-04 DIAGNOSIS — R05 Cough: Secondary | ICD-10-CM | POA: Diagnosis not present

## 2015-02-09 ENCOUNTER — Ambulatory Visit (HOSPITAL_COMMUNITY)
Admission: RE | Admit: 2015-02-09 | Discharge: 2015-02-09 | Disposition: A | Discharge status: Home / Self Care | Payer: Medicare Other | Source: Ambulatory Visit | Attending: Urology | Admitting: Urology

## 2015-02-09 DIAGNOSIS — N281 Cyst of kidney, acquired: Secondary | ICD-10-CM

## 2015-02-22 ENCOUNTER — Ambulatory Visit (HOSPITAL_COMMUNITY)
Admission: RE | Admit: 2015-02-22 | Discharge: 2015-02-22 | Disposition: A | Discharge status: Home / Self Care | Payer: Medicare Other | Source: Ambulatory Visit | Attending: Urology | Admitting: Urology

## 2015-02-22 DIAGNOSIS — N281 Cyst of kidney, acquired: Secondary | ICD-10-CM | POA: Insufficient documentation

## 2015-02-22 DIAGNOSIS — Z940 Kidney transplant status: Secondary | ICD-10-CM | POA: Insufficient documentation

## 2015-03-02 DIAGNOSIS — N281 Cyst of kidney, acquired: Secondary | ICD-10-CM | POA: Diagnosis not present

## 2015-03-18 DIAGNOSIS — Z940 Kidney transplant status: Secondary | ICD-10-CM | POA: Diagnosis not present

## 2015-03-18 DIAGNOSIS — N189 Chronic kidney disease, unspecified: Secondary | ICD-10-CM | POA: Diagnosis not present

## 2015-03-18 DIAGNOSIS — Z79899 Other long term (current) drug therapy: Secondary | ICD-10-CM | POA: Diagnosis not present

## 2015-03-23 DIAGNOSIS — Z940 Kidney transplant status: Secondary | ICD-10-CM | POA: Diagnosis not present

## 2015-03-23 DIAGNOSIS — D631 Anemia in chronic kidney disease: Secondary | ICD-10-CM | POA: Diagnosis not present

## 2015-03-23 DIAGNOSIS — E1129 Type 2 diabetes mellitus with other diabetic kidney complication: Secondary | ICD-10-CM | POA: Diagnosis not present

## 2015-03-23 DIAGNOSIS — N189 Chronic kidney disease, unspecified: Secondary | ICD-10-CM | POA: Diagnosis not present

## 2015-04-16 DIAGNOSIS — R05 Cough: Secondary | ICD-10-CM | POA: Diagnosis not present

## 2015-06-14 DIAGNOSIS — N529 Male erectile dysfunction, unspecified: Secondary | ICD-10-CM | POA: Diagnosis not present

## 2015-06-14 DIAGNOSIS — Z23 Encounter for immunization: Secondary | ICD-10-CM | POA: Diagnosis not present

## 2015-07-25 DIAGNOSIS — Z940 Kidney transplant status: Secondary | ICD-10-CM | POA: Diagnosis not present

## 2015-07-25 DIAGNOSIS — Z79899 Other long term (current) drug therapy: Secondary | ICD-10-CM | POA: Diagnosis not present

## 2015-07-25 DIAGNOSIS — N189 Chronic kidney disease, unspecified: Secondary | ICD-10-CM | POA: Diagnosis not present

## 2015-07-26 DIAGNOSIS — E1129 Type 2 diabetes mellitus with other diabetic kidney complication: Secondary | ICD-10-CM | POA: Diagnosis not present

## 2015-07-26 DIAGNOSIS — E785 Hyperlipidemia, unspecified: Secondary | ICD-10-CM | POA: Diagnosis not present

## 2015-07-26 DIAGNOSIS — Z720 Tobacco use: Secondary | ICD-10-CM | POA: Diagnosis not present

## 2015-07-26 DIAGNOSIS — N400 Enlarged prostate without lower urinary tract symptoms: Secondary | ICD-10-CM | POA: Diagnosis not present

## 2015-07-26 DIAGNOSIS — D631 Anemia in chronic kidney disease: Secondary | ICD-10-CM | POA: Diagnosis not present

## 2015-07-26 DIAGNOSIS — N189 Chronic kidney disease, unspecified: Secondary | ICD-10-CM | POA: Diagnosis not present

## 2015-07-26 DIAGNOSIS — N2581 Secondary hyperparathyroidism of renal origin: Secondary | ICD-10-CM | POA: Diagnosis not present

## 2015-07-26 DIAGNOSIS — Z940 Kidney transplant status: Secondary | ICD-10-CM | POA: Diagnosis not present

## 2015-09-16 DIAGNOSIS — N189 Chronic kidney disease, unspecified: Secondary | ICD-10-CM | POA: Diagnosis not present

## 2015-09-16 DIAGNOSIS — Z940 Kidney transplant status: Secondary | ICD-10-CM | POA: Diagnosis not present

## 2015-09-16 DIAGNOSIS — E785 Hyperlipidemia, unspecified: Secondary | ICD-10-CM | POA: Diagnosis not present

## 2015-09-16 DIAGNOSIS — Z79899 Other long term (current) drug therapy: Secondary | ICD-10-CM | POA: Diagnosis not present

## 2015-09-16 DIAGNOSIS — N2581 Secondary hyperparathyroidism of renal origin: Secondary | ICD-10-CM | POA: Diagnosis not present

## 2015-11-02 DIAGNOSIS — E785 Hyperlipidemia, unspecified: Secondary | ICD-10-CM | POA: Diagnosis not present

## 2015-11-02 DIAGNOSIS — I10 Essential (primary) hypertension: Secondary | ICD-10-CM | POA: Diagnosis not present

## 2015-11-02 DIAGNOSIS — F17200 Nicotine dependence, unspecified, uncomplicated: Secondary | ICD-10-CM | POA: Diagnosis not present

## 2015-11-02 DIAGNOSIS — N189 Chronic kidney disease, unspecified: Secondary | ICD-10-CM | POA: Diagnosis not present

## 2015-11-02 DIAGNOSIS — Z940 Kidney transplant status: Secondary | ICD-10-CM | POA: Diagnosis not present

## 2015-11-02 DIAGNOSIS — R9439 Abnormal result of other cardiovascular function study: Secondary | ICD-10-CM | POA: Insufficient documentation

## 2015-11-15 DIAGNOSIS — E1129 Type 2 diabetes mellitus with other diabetic kidney complication: Secondary | ICD-10-CM | POA: Diagnosis not present

## 2015-11-15 DIAGNOSIS — N2581 Secondary hyperparathyroidism of renal origin: Secondary | ICD-10-CM | POA: Diagnosis not present

## 2015-11-15 DIAGNOSIS — E785 Hyperlipidemia, unspecified: Secondary | ICD-10-CM | POA: Diagnosis not present

## 2015-11-15 DIAGNOSIS — N189 Chronic kidney disease, unspecified: Secondary | ICD-10-CM | POA: Diagnosis not present

## 2015-11-15 DIAGNOSIS — Z940 Kidney transplant status: Secondary | ICD-10-CM | POA: Diagnosis not present

## 2015-11-15 DIAGNOSIS — N400 Enlarged prostate without lower urinary tract symptoms: Secondary | ICD-10-CM | POA: Diagnosis not present

## 2015-11-15 DIAGNOSIS — D631 Anemia in chronic kidney disease: Secondary | ICD-10-CM | POA: Diagnosis not present

## 2015-11-16 DIAGNOSIS — N390 Urinary tract infection, site not specified: Secondary | ICD-10-CM | POA: Diagnosis not present

## 2015-11-16 DIAGNOSIS — Z940 Kidney transplant status: Secondary | ICD-10-CM | POA: Diagnosis not present

## 2015-12-09 DIAGNOSIS — M4807 Spinal stenosis, lumbosacral region: Secondary | ICD-10-CM | POA: Diagnosis not present

## 2015-12-09 DIAGNOSIS — M5136 Other intervertebral disc degeneration, lumbar region: Secondary | ICD-10-CM | POA: Diagnosis not present

## 2015-12-27 DIAGNOSIS — M4807 Spinal stenosis, lumbosacral region: Secondary | ICD-10-CM | POA: Diagnosis not present

## 2016-02-13 ENCOUNTER — Ambulatory Visit (INDEPENDENT_AMBULATORY_CARE_PROVIDER_SITE_OTHER): Payer: Medicare Other | Admitting: Podiatry

## 2016-02-13 ENCOUNTER — Encounter: Payer: Self-pay | Admitting: Podiatry

## 2016-02-13 VITALS — BP 109/68 | HR 64 | Resp 14

## 2016-02-13 DIAGNOSIS — B351 Tinea unguium: Secondary | ICD-10-CM

## 2016-02-13 DIAGNOSIS — M79676 Pain in unspecified toe(s): Secondary | ICD-10-CM | POA: Diagnosis not present

## 2016-02-13 NOTE — Progress Notes (Signed)
   Subjective:    Patient ID: Theodore Arellano, male    DOB: October 28, 1960, 55 y.o.   MRN: [id number]  HPI This patient presents to the office with chief complaint of long thick painful nails. This patient states the nails are painful walking and wearing his shoes. He says it has been months since his nails have been worked on. He presents the office  for evaluation and treatment of this condition   Review of Systems  All other systems reviewed and are negative.      Objective:   Physical Exam GENERAL APPEARANCE: Alert, conversant. Appropriately groomed. No acute distress.  VASCULAR: Pedal pulses are not  palpable at  [facility] and PT bilateral.  Capillary refill time is immediate to all digits,  Normal temperature gradient.  Digital hair growth is present bilateral  NEUROLOGIC: sensation is normal to 5.07 monofilament at 5/5 sites bilateral.  Light touch is intact bilateral, Muscle strength normal.  MUSCULOSKELETAL: acceptable muscle strength, tone and stability bilateral.  Intrinsic muscluature intact bilateral. 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   DERMATOLOGIC: skin color, texture, and turgor are within normal limits.  No preulcerative lesions or ulcers  are seen, no interdigital maceration noted.  No open lesions present.  . No drainage noted. NAILS  Thick disfigured discolored nails both feet.         Assessment & Plan:  Onychomycosis  B/L  IE  Debridement of Nails B/L  RTC 3 months   Gardiner Barefoot DPM

## 2016-02-28 DIAGNOSIS — Z940 Kidney transplant status: Secondary | ICD-10-CM | POA: Diagnosis not present

## 2016-02-28 DIAGNOSIS — N189 Chronic kidney disease, unspecified: Secondary | ICD-10-CM | POA: Diagnosis not present

## 2016-03-16 DIAGNOSIS — N281 Cyst of kidney, acquired: Secondary | ICD-10-CM | POA: Diagnosis not present

## 2016-03-16 DIAGNOSIS — N401 Enlarged prostate with lower urinary tract symptoms: Secondary | ICD-10-CM | POA: Diagnosis not present

## 2016-03-16 DIAGNOSIS — R3129 Other microscopic hematuria: Secondary | ICD-10-CM | POA: Diagnosis not present

## 2016-03-16 DIAGNOSIS — R351 Nocturia: Secondary | ICD-10-CM | POA: Diagnosis not present

## 2016-03-28 DIAGNOSIS — Z940 Kidney transplant status: Secondary | ICD-10-CM | POA: Diagnosis not present

## 2016-03-28 DIAGNOSIS — E1129 Type 2 diabetes mellitus with other diabetic kidney complication: Secondary | ICD-10-CM | POA: Diagnosis not present

## 2016-03-28 DIAGNOSIS — N400 Enlarged prostate without lower urinary tract symptoms: Secondary | ICD-10-CM | POA: Diagnosis not present

## 2016-03-28 DIAGNOSIS — Z720 Tobacco use: Secondary | ICD-10-CM | POA: Diagnosis not present

## 2016-03-28 DIAGNOSIS — N2581 Secondary hyperparathyroidism of renal origin: Secondary | ICD-10-CM | POA: Diagnosis not present

## 2016-03-28 DIAGNOSIS — D631 Anemia in chronic kidney disease: Secondary | ICD-10-CM | POA: Diagnosis not present

## 2016-03-28 DIAGNOSIS — E785 Hyperlipidemia, unspecified: Secondary | ICD-10-CM | POA: Diagnosis not present

## 2016-03-28 DIAGNOSIS — N189 Chronic kidney disease, unspecified: Secondary | ICD-10-CM | POA: Diagnosis not present

## 2016-03-28 DIAGNOSIS — E669 Obesity, unspecified: Secondary | ICD-10-CM | POA: Diagnosis not present

## 2016-05-21 ENCOUNTER — Ambulatory Visit: Payer: Medicare Other | Admitting: Podiatry

## 2016-05-28 DIAGNOSIS — E785 Hyperlipidemia, unspecified: Secondary | ICD-10-CM | POA: Diagnosis not present

## 2016-05-28 DIAGNOSIS — N189 Chronic kidney disease, unspecified: Secondary | ICD-10-CM | POA: Diagnosis not present

## 2016-05-28 DIAGNOSIS — Z940 Kidney transplant status: Secondary | ICD-10-CM | POA: Diagnosis not present

## 2016-05-28 DIAGNOSIS — N2581 Secondary hyperparathyroidism of renal origin: Secondary | ICD-10-CM | POA: Diagnosis not present

## 2016-05-29 DIAGNOSIS — Z23 Encounter for immunization: Secondary | ICD-10-CM | POA: Diagnosis not present

## 2016-05-29 DIAGNOSIS — R319 Hematuria, unspecified: Secondary | ICD-10-CM | POA: Diagnosis not present

## 2016-07-03 DIAGNOSIS — R9439 Abnormal result of other cardiovascular function study: Secondary | ICD-10-CM | POA: Diagnosis not present

## 2016-07-03 DIAGNOSIS — F17200 Nicotine dependence, unspecified, uncomplicated: Secondary | ICD-10-CM | POA: Diagnosis not present

## 2016-07-03 DIAGNOSIS — E785 Hyperlipidemia, unspecified: Secondary | ICD-10-CM | POA: Diagnosis not present

## 2016-07-03 DIAGNOSIS — Z940 Kidney transplant status: Secondary | ICD-10-CM | POA: Diagnosis not present

## 2016-08-06 DIAGNOSIS — N189 Chronic kidney disease, unspecified: Secondary | ICD-10-CM | POA: Diagnosis not present

## 2016-08-06 DIAGNOSIS — Z940 Kidney transplant status: Secondary | ICD-10-CM | POA: Diagnosis not present

## 2016-08-10 DIAGNOSIS — E1129 Type 2 diabetes mellitus with other diabetic kidney complication: Secondary | ICD-10-CM | POA: Diagnosis not present

## 2016-08-10 DIAGNOSIS — D631 Anemia in chronic kidney disease: Secondary | ICD-10-CM | POA: Diagnosis not present

## 2016-08-10 DIAGNOSIS — N2581 Secondary hyperparathyroidism of renal origin: Secondary | ICD-10-CM | POA: Diagnosis not present

## 2016-08-10 DIAGNOSIS — E785 Hyperlipidemia, unspecified: Secondary | ICD-10-CM | POA: Diagnosis not present

## 2016-08-10 DIAGNOSIS — Z6832 Body mass index (BMI) 32.0-32.9, adult: Secondary | ICD-10-CM | POA: Diagnosis not present

## 2016-08-10 DIAGNOSIS — E669 Obesity, unspecified: Secondary | ICD-10-CM | POA: Diagnosis not present

## 2016-08-10 DIAGNOSIS — Z940 Kidney transplant status: Secondary | ICD-10-CM | POA: Diagnosis not present

## 2016-08-10 DIAGNOSIS — Z720 Tobacco use: Secondary | ICD-10-CM | POA: Diagnosis not present

## 2016-08-10 DIAGNOSIS — N189 Chronic kidney disease, unspecified: Secondary | ICD-10-CM | POA: Diagnosis not present

## 2016-08-10 DIAGNOSIS — N400 Enlarged prostate without lower urinary tract symptoms: Secondary | ICD-10-CM | POA: Diagnosis not present

## 2016-08-26 ENCOUNTER — Emergency Department (HOSPITAL_COMMUNITY): Payer: Medicare Other

## 2016-08-26 ENCOUNTER — Encounter (HOSPITAL_COMMUNITY): Payer: Self-pay

## 2016-08-26 ENCOUNTER — Observation Stay (HOSPITAL_COMMUNITY)
Admission: EM | Admit: 2016-08-26 | Discharge: 2016-08-27 | Disposition: A | Discharge status: Home / Self Care | Payer: Medicare Other | Attending: Internal Medicine | Admitting: Internal Medicine

## 2016-08-26 DIAGNOSIS — Z7982 Long term (current) use of aspirin: Secondary | ICD-10-CM | POA: Insufficient documentation

## 2016-08-26 DIAGNOSIS — E1169 Type 2 diabetes mellitus with other specified complication: Secondary | ICD-10-CM | POA: Insufficient documentation

## 2016-08-26 DIAGNOSIS — I10 Essential (primary) hypertension: Secondary | ICD-10-CM | POA: Diagnosis not present

## 2016-08-26 DIAGNOSIS — Z7984 Long term (current) use of oral hypoglycemic drugs: Secondary | ICD-10-CM | POA: Diagnosis not present

## 2016-08-26 DIAGNOSIS — E1159 Type 2 diabetes mellitus with other circulatory complications: Secondary | ICD-10-CM | POA: Diagnosis present

## 2016-08-26 DIAGNOSIS — E785 Hyperlipidemia, unspecified: Secondary | ICD-10-CM | POA: Diagnosis not present

## 2016-08-26 DIAGNOSIS — R079 Chest pain, unspecified: Secondary | ICD-10-CM | POA: Diagnosis not present

## 2016-08-26 DIAGNOSIS — Z940 Kidney transplant status: Secondary | ICD-10-CM | POA: Diagnosis not present

## 2016-08-26 DIAGNOSIS — Z79899 Other long term (current) drug therapy: Secondary | ICD-10-CM | POA: Diagnosis not present

## 2016-08-26 DIAGNOSIS — F17210 Nicotine dependence, cigarettes, uncomplicated: Secondary | ICD-10-CM | POA: Diagnosis not present

## 2016-08-26 DIAGNOSIS — R0789 Other chest pain: Secondary | ICD-10-CM | POA: Diagnosis not present

## 2016-08-26 DIAGNOSIS — I2510 Atherosclerotic heart disease of native coronary artery without angina pectoris: Secondary | ICD-10-CM | POA: Diagnosis not present

## 2016-08-26 DIAGNOSIS — Z7951 Long term (current) use of inhaled steroids: Secondary | ICD-10-CM | POA: Diagnosis not present

## 2016-08-26 DIAGNOSIS — I441 Atrioventricular block, second degree: Secondary | ICD-10-CM | POA: Diagnosis not present

## 2016-08-26 DIAGNOSIS — Z7952 Long term (current) use of systemic steroids: Secondary | ICD-10-CM | POA: Insufficient documentation

## 2016-08-26 HISTORY — DX: Atherosclerotic heart disease of native coronary artery without angina pectoris: I25.10

## 2016-08-26 LAB — CBC
HCT: 40.4 % (ref 39.0–52.0)
Hemoglobin: 13.8 g/dL (ref 13.0–17.0)
MCH: 29.5 pg (ref 26.0–34.0)
MCHC: 34.2 g/dL (ref 30.0–36.0)
MCV: 86.3 fL (ref 78.0–100.0)
Platelets: 184 10*3/uL (ref 150–400)
RBC: 4.68 MIL/uL (ref 4.22–5.81)
RDW: 14.4 % (ref 11.5–15.5)
WBC: 6.4 10*3/uL (ref 4.0–10.5)

## 2016-08-26 LAB — BASIC METABOLIC PANEL
Anion gap: 9 (ref 5–15)
BUN: 15 mg/dL (ref 6–20)
CO2: 21 mmol/L — ABNORMAL LOW (ref 22–32)
Calcium: 9.2 mg/dL (ref 8.9–10.3)
Chloride: 107 mmol/L (ref 101–111)
Creatinine, Ser: 1.25 mg/dL — ABNORMAL HIGH (ref 0.61–1.24)
GFR calc Af Amer: >60 mL/min (ref >60)
GFR calc non Af Amer: >60 mL/min (ref >60)
Glucose, Bld: 115 mg/dL — ABNORMAL HIGH (ref 65–99)
Potassium: 4.2 mmol/L (ref 3.5–5.1)
Sodium: 137 mmol/L (ref 135–145)

## 2016-08-26 LAB — I-STAT TROPONIN, ED: Troponin i, poc: 0.01 ng/mL (ref 0.00–0.08)

## 2016-08-26 MED ORDER — ASPIRIN 81 MG PO CHEW
324.0000 mg | CHEWABLE_TABLET | Freq: Once | ORAL | Status: AC
  Administered 2016-08-27: 324 mg via ORAL
  Filled 2016-08-26: qty 4

## 2016-08-26 NOTE — ED Notes (Signed)
Patient transported to X-ray 

## 2016-08-26 NOTE — ED Triage Notes (Signed)
Pt states he started having right to mid chest pain radiating into shoulders x 1 week; pt states he took nitro x 2 last night and 1 nitro tonight; pt states he believes nitro helped to ease pain; Pt states pain at 4/10 on arrival. Pt a&ox4; pt has hx of Kidney transplant and some blockage in heart;

## 2016-08-26 NOTE — ED Provider Notes (Signed)
By signing my name below, I, Maud Deed. Royston Sinner, attest that this documentation has been prepared under the direction and in the presence of [place], DO.  Electronically Signed: Maud Deed. Royston Sinner, ED Scribe. 08/26/16. 11:39 PM.   TIME SEEN: 11:44 PM   CHIEF COMPLAINT:  Chief Complaint  Patient presents with  . Back Pain  . Chest Pain    HPI:  HPI Comments: Theodore Arellano, current every day smoker, is a 55 y.o. male with a PMHx of HTN, kidney disease- transplant in 2011 and hyperlipidemia who presents to the Emergency Department complaining of intermittent, worsening central to R sided chest pain x 2 weeks. Pain is described as pressure. He states pain radiates into the shoulders and back. Currently he states he is chest pain free but says when episodes come, pain is more so noticed after eating and when exerting himself. No aggravating or alleviating factors reported. 1 Nitro tab attempted today with Complete resolution. In addition, pt states he took 2 nitro tabs yesterday. No recent fever, chills, nausea, dizziness, vomiting, abdominal pain, shortness of breath, or diaphoresis. Last cardiac stress test at [facility] approximately 1-2 years ago which was abnormal.  Last seen by cardiologist with [facility] on October 2017 and started on Ranexa. Has never had a cardiac catheterization.  PCP: [name], MD    ROS: See HPI Constitutional: no fever  Eyes: no drainage  ENT: no runny nose   Cardiovascular:  Chest pain  Resp: no SOB  GI: no vomiting GU: no dysuria Integumentary: no rash  Allergy: no hives  Musculoskeletal: Back pain, arthralgias. No leg swelling  Neurological: no slurred speech ROS otherwise negative  PAST MEDICAL HISTORY/PAST SURGICAL HISTORY:  Past Medical History:  Diagnosis Date  . Hyperlipidemia   . Hypertension   . Kidney disease    s/p kidney transplant 2011    MEDICATIONS:  Prior to Admission medications   Medication Sig Start Date End  Date Taking? Authorizing Provider  ALPRAZolam Duanne Moron) 1 MG tablet Take 1 mg by mouth daily as needed. For anxiety.    Historical Provider, MD  amLODipine (NORVASC) 10 MG tablet Take 10 mg by mouth daily.     Historical Provider, MD  amoxicillin (AMOXIL) 500 MG capsule  06/23/14   Historical Provider, MD  aspirin EC 81 MG tablet Take 81 mg by mouth daily.    Historical Provider, MD  budesonide (PULMICORT) 180 MCG/ACT inhaler Inhale 2 puffs into the lungs 2 (two) times daily as needed (wheezing).     Historical Provider, MD  carvedilol (COREG) 25 MG tablet Take 25 mg by mouth 2 (two) times daily with a meal.    Historical Provider, MD  colchicine 0.6 MG tablet Take 0.6 mg by mouth daily as needed. For gout    Historical Provider, MD  gabapentin (NEURONTIN) 100 MG capsule Take 100 mg by mouth 2 (two) times daily.    Historical Provider, MD  gabapentin (NEURONTIN) 300 MG capsule  07/21/14   Historical Provider, MD  HYDROcodone-acetaminophen (NORCO/VICODIN) 5-325 MG per tablet Take 1-2 tablets by mouth every 6 hours as needed for pain. 09/26/14   Dahlia Bailiff, PA-C  lisinopril (PRINIVIL,ZESTRIL) 10 MG tablet Take 10 mg by mouth daily.    Historical Provider, MD  magnesium oxide (MAG-OX) 400 (241.3 MG) MG tablet  07/09/14   Historical Provider, MD  magnesium oxide (MAG-OX) 400 MG tablet Take 400 mg by mouth every evening.     Historical Provider, MD  mycophenolate (MYFORTIC)  360 MG TBEC Take 720 mg by mouth 2 (two) times daily.    Historical Provider, MD  NITROSTAT 0.4 MG SL tablet  07/27/14   Historical Provider, MD  omeprazole (PRILOSEC) 20 MG capsule Take 20 mg by mouth daily.    Historical Provider, MD  oxyCODONE-acetaminophen (PERCOCET/ROXICET) 5-325 MG per tablet Take 2 tablets by mouth every 4 (four) hours as needed for severe pain. 03/23/14   Lucila Maine, PA-C  pioglitazone (ACTOS) 15 MG tablet Take 15 mg by mouth daily.    Historical Provider, MD  predniSONE (DELTASONE) 5 MG tablet Take 5 mg by  mouth every morning.     Historical Provider, MD  simvastatin (ZOCOR) 40 MG tablet Take 40 mg by mouth every Monday, Wednesday, and Friday.     Historical Provider, MD  sodium bicarbonate 650 MG tablet Take 1,300 mg by mouth 2 (two) times daily.    Historical Provider, MD  tacrolimus (PROGRAF) 1 MG capsule Take 2-3 mg by mouth 2 (two) times daily. Take 3 mg every AM and 2 mg every PM    Historical Provider, MD  triamcinolone cream (KENALOG) 0.1 %  05/12/14   Historical Provider, MD    ALLERGIES:  Allergies  Allergen Reactions  . Shellfish Allergy Hives  . Tomato Hives    SOCIAL HISTORY:  Social History  Substance Use Topics  . Smoking status: Current Every Day Smoker    Packs/day: 0.50    Types: Cigarettes  . Smokeless tobacco: Never Used  . Alcohol use Yes    FAMILY HISTORY: Family History  Problem Relation Age of Onset  . Hypertension Father   . Diabetes Mother     EXAM: BP 137/88 (BP Location: Left Arm)   Pulse 65   Temp 98.1 F (36.7 C) (Oral)   Resp 20   Ht 5\' 8"  (1.727 m)   Wt 200 lb (90.7 kg)   SpO2 100%   BMI 30.41 kg/m  CONSTITUTIONAL: Alert and oriented and responds appropriately to questions. Obese and chronically ill appearing HEAD: Normocephalic EYES: Conjunctivae clear, PERRL, EOMI ENT: normal nose; no rhinorrhea; moist mucous membranes NECK: Supple, no meningismus, no nuchal rigidity, no LAD  CARD: RRR; S1 and S2 appreciated; no murmurs, no clicks, no rubs, no gallops RESP: Normal chest excursion without splinting or tachypnea; breath sounds clear and equal bilaterally; no wheezes, no rhonchi, no rales, no hypoxia or respiratory distress, speaking full sentences ABD/GI: Normal bowel sounds; non-distended; soft, non-tender, no rebound, no guarding, no peritoneal signs, no hepatosplenomegaly BACK:  The back appears normal and is non-tender to palpation, there is no CVA tenderness EXT: Normal ROM in all joints; non-tender to palpation; no edema; normal  capillary refill; no cyanosis, no calf tenderness or swelling    SKIN: Normal color for age and race; warm; no rash NEURO: Moves all extremities equally, sensation to light touch intact diffusely, cranial nerves II through XII intact, normal speech PSYCH: The patient's mood and manner are appropriate. Grooming and personal hygiene are appropriate.  MEDICAL DECISION MAKING: Patient here with exertional chest pain with recent abnormal stress test. First troponin negative. Chest x-ray clear. EKG shows first-degree AV block is unchanged. No new ischemic abnormality. Have recommended admission for chest pain rule out given recent abnormal stress test. Patient is comfortable with this plan. PCP is Dr. Arelia Sneddon. We'll discuss with hospitalist.  ED PROGRESS:    12:05 AM  Discussed patient's case with hospitatlist, Dr. Hal Hope.  Recommend admission to tele, obs bed.  I will place holding orders per their request. Patient and family (if present) updated with plan. Care transferred to hospitalist service.  I reviewed all nursing notes, vitals, pertinent old records, EKGs, labs, imaging (as available).      EKG Interpretation  Date/Time:  Sunday August 26 2016 22:23:30 EST Ventricular Rate:  69 PR Interval:  218 QRS Duration: 100 QT Interval:  414 QTC Calculation: 443 R Axis:   33 Text Interpretation:  Sinus rhythm with 1st degree A-V block Nonspecific T wave abnormality Abnormal ECG No significant change since last tracing Confirmed by Ramesha Poster,  DO, Jhonatan Lomeli [phone]) on 08/26/2016 11:07:06 PM        I personally performed the services described in this documentation, which was scribed in my presence. The recorded information has been reviewed and is accurate.     [place], DO 08/27/16 [phone]

## 2016-08-27 ENCOUNTER — Encounter (HOSPITAL_COMMUNITY): Payer: Self-pay | Admitting: Internal Medicine

## 2016-08-27 ENCOUNTER — Observation Stay (HOSPITAL_BASED_OUTPATIENT_CLINIC_OR_DEPARTMENT_OTHER): Payer: Medicare Other

## 2016-08-27 DIAGNOSIS — E782 Mixed hyperlipidemia: Secondary | ICD-10-CM

## 2016-08-27 DIAGNOSIS — E785 Hyperlipidemia, unspecified: Secondary | ICD-10-CM | POA: Diagnosis present

## 2016-08-27 DIAGNOSIS — I2510 Atherosclerotic heart disease of native coronary artery without angina pectoris: Secondary | ICD-10-CM | POA: Diagnosis not present

## 2016-08-27 DIAGNOSIS — I441 Atrioventricular block, second degree: Secondary | ICD-10-CM | POA: Diagnosis not present

## 2016-08-27 DIAGNOSIS — Z940 Kidney transplant status: Secondary | ICD-10-CM | POA: Diagnosis not present

## 2016-08-27 DIAGNOSIS — F17210 Nicotine dependence, cigarettes, uncomplicated: Secondary | ICD-10-CM | POA: Diagnosis not present

## 2016-08-27 DIAGNOSIS — R0789 Other chest pain: Secondary | ICD-10-CM | POA: Diagnosis not present

## 2016-08-27 DIAGNOSIS — Z7952 Long term (current) use of systemic steroids: Secondary | ICD-10-CM | POA: Diagnosis not present

## 2016-08-27 DIAGNOSIS — I10 Essential (primary) hypertension: Secondary | ICD-10-CM | POA: Diagnosis not present

## 2016-08-27 DIAGNOSIS — Z7951 Long term (current) use of inhaled steroids: Secondary | ICD-10-CM | POA: Diagnosis not present

## 2016-08-27 DIAGNOSIS — R079 Chest pain, unspecified: Secondary | ICD-10-CM

## 2016-08-27 DIAGNOSIS — Z79899 Other long term (current) drug therapy: Secondary | ICD-10-CM | POA: Diagnosis not present

## 2016-08-27 DIAGNOSIS — Z7982 Long term (current) use of aspirin: Secondary | ICD-10-CM | POA: Diagnosis not present

## 2016-08-27 DIAGNOSIS — E1169 Type 2 diabetes mellitus with other specified complication: Secondary | ICD-10-CM | POA: Diagnosis not present

## 2016-08-27 DIAGNOSIS — E1159 Type 2 diabetes mellitus with other circulatory complications: Secondary | ICD-10-CM

## 2016-08-27 LAB — PROTIME-INR
INR: 0.98
Prothrombin Time: 13 seconds (ref 11.4–15.2)

## 2016-08-27 LAB — GLUCOSE, CAPILLARY
Glucose-Capillary: 112 mg/dL — ABNORMAL HIGH (ref 65–99)
Glucose-Capillary: 105 mg/dL — ABNORMAL HIGH (ref 65–99)
Glucose-Capillary: 108 mg/dL — ABNORMAL HIGH (ref 65–99)

## 2016-08-27 LAB — TROPONIN I
Troponin I: <0.03 ng/mL (ref <0.03)
Troponin I: 0.03 ng/mL (ref <0.03)
Troponin I: 0.03 ng/mL (ref <0.03)

## 2016-08-27 LAB — ECHOCARDIOGRAM COMPLETE
Height: 68 in
Weight: 3174.4 oz

## 2016-08-27 LAB — LIPASE, BLOOD: Lipase: 27 U/L (ref 11–51)

## 2016-08-27 MED ORDER — ALPRAZOLAM 0.5 MG PO TABS: 1.0000 mg | ORAL_TABLET | Freq: Every day | ORAL | Status: DC | PRN

## 2016-08-27 MED ORDER — GABAPENTIN 100 MG PO CAPS
100.0000 mg | ORAL_CAPSULE | Freq: Two times a day (BID) | ORAL | Status: DC
  Administered 2016-08-27 (×2): 100 mg via ORAL
  Filled 2016-08-27 (×2): qty 1

## 2016-08-27 MED ORDER — AMLODIPINE BESYLATE 10 MG PO TABS: 10.0000 mg | ORAL_TABLET | Freq: Every day | ORAL | Status: DC

## 2016-08-27 MED ORDER — AMLODIPINE BESYLATE 10 MG PO TABS
10.0000 mg | ORAL_TABLET | Freq: Every day | ORAL | Status: DC
  Filled 2016-08-27: qty 1

## 2016-08-27 MED ORDER — ENOXAPARIN SODIUM 40 MG/0.4ML ~~LOC~~ SOLN
40.0000 mg | SUBCUTANEOUS | Status: DC
  Administered 2016-08-27: 40 mg via SUBCUTANEOUS
  Filled 2016-08-27: qty 0.4

## 2016-08-27 MED ORDER — TACROLIMUS 1 MG PO CAPS
3.0000 mg | ORAL_CAPSULE | Freq: Every day | ORAL | Status: DC
  Administered 2016-08-27: 3 mg via ORAL
  Filled 2016-08-27: qty 3

## 2016-08-27 MED ORDER — ASPIRIN EC 81 MG PO TBEC: 81.0000 mg | DELAYED_RELEASE_TABLET | Freq: Every day | ORAL | Status: DC

## 2016-08-27 MED ORDER — BUDESONIDE 0.5 MG/2ML IN SUSP
0.5000 mg | Freq: Two times a day (BID) | RESPIRATORY_TRACT | Status: DC
  Administered 2016-08-27: 0.5 mg via RESPIRATORY_TRACT
  Filled 2016-08-27: qty 2

## 2016-08-27 MED ORDER — ATORVASTATIN CALCIUM 20 MG PO TABS: 20.0000 mg | ORAL_TABLET | ORAL | Status: DC

## 2016-08-27 MED ORDER — TACROLIMUS 1 MG PO CAPS: 2.0000 mg | ORAL_CAPSULE | Freq: Every day | ORAL | Status: DC

## 2016-08-27 MED ORDER — NITROGLYCERIN 0.4 MG SL SUBL: 0.4000 mg | SUBLINGUAL_TABLET | SUBLINGUAL | Status: DC | PRN

## 2016-08-27 MED ORDER — SODIUM BICARBONATE 650 MG PO TABS
1300.0000 mg | ORAL_TABLET | Freq: Two times a day (BID) | ORAL | Status: DC
Start: 2016-08-27 — End: 2016-08-27
  Administered 2016-08-27: 1300 mg via ORAL
  Filled 2016-08-27: qty 2

## 2016-08-27 MED ORDER — LISINOPRIL 10 MG PO TABS: 10.0000 mg | ORAL_TABLET | Freq: Every day | ORAL | 0 refills | Status: DC

## 2016-08-27 MED ORDER — ASPIRIN EC 325 MG PO TBEC
325.0000 mg | DELAYED_RELEASE_TABLET | Freq: Every day | ORAL | Status: DC
  Administered 2016-08-27: 325 mg via ORAL
  Filled 2016-08-27: qty 1

## 2016-08-27 MED ORDER — OXYCODONE-ACETAMINOPHEN 5-325 MG PO TABS: 2.0000 | ORAL_TABLET | ORAL | Status: DC | PRN

## 2016-08-27 MED ORDER — LISINOPRIL 10 MG PO TABS
10.0000 mg | ORAL_TABLET | Freq: Every day | ORAL | Status: DC
  Administered 2016-08-27: 10 mg via ORAL
  Filled 2016-08-27: qty 1

## 2016-08-27 MED ORDER — MYCOPHENOLATE SODIUM 180 MG PO TBEC
720.0000 mg | DELAYED_RELEASE_TABLET | Freq: Two times a day (BID) | ORAL | Status: DC
  Administered 2016-08-27: 720 mg via ORAL
  Filled 2016-08-27: qty 4

## 2016-08-27 MED ORDER — MAGNESIUM OXIDE 400 (241.3 MG) MG PO TABS: 400.0000 mg | ORAL_TABLET | Freq: Every evening | ORAL | Status: DC

## 2016-08-27 MED ORDER — RANOLAZINE ER 500 MG PO TB12
500.0000 mg | ORAL_TABLET | Freq: Two times a day (BID) | ORAL | Status: DC
  Administered 2016-08-27: 500 mg via ORAL
  Filled 2016-08-27: qty 1

## 2016-08-27 MED ORDER — ACETAMINOPHEN 325 MG PO TABS: 650.0000 mg | ORAL_TABLET | ORAL | Status: DC | PRN

## 2016-08-27 MED ORDER — ONDANSETRON HCL 4 MG/2ML IJ SOLN: 4.0000 mg | Freq: Four times a day (QID) | INTRAMUSCULAR | Status: DC | PRN

## 2016-08-27 MED ORDER — INSULIN ASPART 100 UNIT/ML ~~LOC~~ SOLN: 0.0000 [IU] | Freq: Three times a day (TID) | SUBCUTANEOUS | Status: DC

## 2016-08-27 MED ORDER — PANTOPRAZOLE SODIUM 40 MG PO TBEC
40.0000 mg | DELAYED_RELEASE_TABLET | Freq: Every day | ORAL | Status: DC
  Administered 2016-08-27: 40 mg via ORAL
  Filled 2016-08-27: qty 1

## 2016-08-27 MED ORDER — PREDNISONE 5 MG PO TABS
5.0000 mg | ORAL_TABLET | Freq: Every morning | ORAL | Status: DC
  Administered 2016-08-27: 5 mg via ORAL
  Filled 2016-08-27: qty 1

## 2016-08-27 MED ORDER — SIMVASTATIN 40 MG PO TABS
40.0000 mg | ORAL_TABLET | ORAL | Status: DC
  Filled 2016-08-27: qty 1

## 2016-08-27 MED ORDER — ATORVASTATIN CALCIUM 20 MG PO TABS: 20.0000 mg | ORAL_TABLET | ORAL | 0 refills | Status: DC

## 2016-08-27 MED ORDER — SIMVASTATIN 40 MG PO TABS: 40.0000 mg | ORAL_TABLET | ORAL | Status: DC

## 2016-08-27 NOTE — Progress Notes (Signed)
Pt having brief periods of bradycardia - EKG shows 2nd deg. Mobitz II block- paged Dr. Hal Hope. He is coming to see pt for admission.

## 2016-08-27 NOTE — Progress Notes (Signed)
Discharge instructions, RX's and follow up appts explained and provided to patient verbalized understanding. Patient left floor via wheelchair accompanied by staff no c/o pain or shortness of breath at d/c.  Tarin Navarez Lynn, RN  

## 2016-08-27 NOTE — Progress Notes (Signed)
Pt reports he used to take pulmicort inhaler at home but has not taken for over 2 years. Pt states he does not need it. RT will continue to monitor.

## 2016-08-27 NOTE — H&P (Signed)
History and Physical    Theodore Arellano:[id number] DOB: June 23, 1961 DOA: 08/26/2016  PCP: [name], MD  Patient coming from: Home.  Chief Complaint: Chest pain.  HPI: Theodore Arellano is a 55 y.o. male with CAD, renal transplant, hyperlipidemia, hypertension and diabetes mellitus type 2 presents with the ER because of chest pain. Patient states over the last few days has been having experiencing chest pain on exertion. Pain lasts for a few minutes each time. Retrosternal radiating to the back relieves on resting. At this time patient is chest pain-free. Cardiac markers is negative and EKG is showing normal sinus rhythm with first-degree AV block.  After admission patient had transient type II second-degree AV block.    ED Course: Cardiac markers chest x-ray and EKG were unremarkable.   Review of Systems: As per HPI, rest all negative.   Past Medical History:  Diagnosis Date  . Hyperlipidemia   . Hypertension   . Kidney disease    s/p kidney transplant 2011    Past Surgical History:  Procedure Laterality Date  . HD access procedures    . KIDNEY TRANSPLANT  2011  . LEFT HEART CATHETERIZATION WITH CORONARY ANGIOGRAM  03/11/2012   Procedure: LEFT HEART CATHETERIZATION WITH CORONARY ANGIOGRAM;  Surgeon: Jolaine Artist, MD;  Location: [facility] CATH LAB;  Service: Cardiovascular;;  . RIGHT HEART CATHETERIZATION N/A 03/11/2012   Procedure: RIGHT HEART CATH;  Surgeon: Jolaine Artist, MD;  Location: [facility] CATH LAB;  Service: Cardiovascular;  Laterality: N/A;     reports that he has been smoking Cigarettes.  He has been smoking about 0.50 packs per day. He has never used smokeless tobacco. He reports that he drinks alcohol. He reports that he does not use drugs.  Allergies  Allergen Reactions  . Shellfish Allergy Hives  . Tomato Hives    Family History  Problem Relation Age of Onset  . Hypertension Father   . Diabetes Mother     Prior to Admission medications     Medication Sig Start Date End Date Taking? Authorizing Provider  ALPRAZolam Duanne Moron) 1 MG tablet Take 1 mg by mouth daily as needed. For anxiety.    Historical Provider, MD  amLODipine (NORVASC) 10 MG tablet Take 10 mg by mouth daily.     Historical Provider, MD  amoxicillin (AMOXIL) 500 MG capsule  06/23/14   Historical Provider, MD  aspirin EC 81 MG tablet Take 81 mg by mouth daily.    Historical Provider, MD  budesonide (PULMICORT) 180 MCG/ACT inhaler Inhale 2 puffs into the lungs 2 (two) times daily as needed (wheezing).     Historical Provider, MD  carvedilol (COREG) 25 MG tablet Take 25 mg by mouth 2 (two) times daily with a meal.    Historical Provider, MD  colchicine 0.6 MG tablet Take 0.6 mg by mouth daily as needed. For gout    Historical Provider, MD  gabapentin (NEURONTIN) 100 MG capsule Take 100 mg by mouth 2 (two) times daily.    Historical Provider, MD  gabapentin (NEURONTIN) 300 MG capsule  07/21/14   Historical Provider, MD  HYDROcodone-acetaminophen (NORCO/VICODIN) 5-325 MG per tablet Take 1-2 tablets by mouth every 6 hours as needed for pain. 09/26/14   Dahlia Bailiff, PA-C  lisinopril (PRINIVIL,ZESTRIL) 10 MG tablet Take 10 mg by mouth daily.    Historical Provider, MD  magnesium oxide (MAG-OX) 400 (241.3 MG) MG tablet  07/09/14   Historical Provider, MD  magnesium oxide (MAG-OX) 400 MG tablet Take  400 mg by mouth every evening.     Historical Provider, MD  mycophenolate (MYFORTIC) 360 MG TBEC Take 720 mg by mouth 2 (two) times daily.    Historical Provider, MD  NITROSTAT 0.4 MG SL tablet  07/27/14   Historical Provider, MD  omeprazole (PRILOSEC) 20 MG capsule Take 20 mg by mouth daily.    Historical Provider, MD  oxyCODONE-acetaminophen (PERCOCET/ROXICET) 5-325 MG per tablet Take 2 tablets by mouth every 4 (four) hours as needed for severe pain. 03/23/14   Lucila Maine, PA-C  pioglitazone (ACTOS) 15 MG tablet Take 15 mg by mouth daily.    Historical Provider, MD  predniSONE  (DELTASONE) 5 MG tablet Take 5 mg by mouth every morning.     Historical Provider, MD  simvastatin (ZOCOR) 40 MG tablet Take 40 mg by mouth every Monday, Wednesday, and Friday.     Historical Provider, MD  sodium bicarbonate 650 MG tablet Take 1,300 mg by mouth 2 (two) times daily.    Historical Provider, MD  tacrolimus (PROGRAF) 1 MG capsule Take 2-3 mg by mouth 2 (two) times daily. Take 3 mg every AM and 2 mg every PM    Historical Provider, MD  triamcinolone cream (KENALOG) 0.1 %  05/12/14   Historical Provider, MD    Physical Exam: Vitals:   08/26/16 2227 08/27/16 0000 08/27/16 0030 08/27/16 0055  BP:  152/93 145/93 (!) 141/95  Pulse:  64 61 63  Resp:  20 18 18   Temp:    98 F (36.7 C)  TempSrc:    Oral  SpO2: 100% 97% 96% 99%  Weight:    90 kg (198 lb 6.4 oz)  Height:    5\' 8"  (1.727 m)      Constitutional: Moderately built and nourished.  Vitals:   08/26/16 2227 08/27/16 0000 08/27/16 0030 08/27/16 0055  BP:  152/93 145/93 (!) 141/95  Pulse:  64 61 63  Resp:  20 18 18   Temp:    98 F (36.7 C)  TempSrc:    Oral  SpO2: 100% 97% 96% 99%  Weight:    90 kg (198 lb 6.4 oz)  Height:    5\' 8"  (1.727 m)   Eyes: Anicteric no pallor.  ENMT: No discharge from the ears eyes nose or mouth.  Neck: No mass felt. No JVD appreciated.  Respiratory: No rhonchi or crepitations.  Cardiovascular: S1-S2 heard no murmurs appreciated.  Abdomen: Soft nontender bowel sounds present. No guarding or rigidity.  Musculoskeletal: No edema.  Skin: No rash.  Neurologic: Alert awake oriented to time place and person. Moves all extremities.  Psychiatric: Appears normal. Normal affect.  Labs on Admission: I have personally reviewed following labs and imaging studies  CBC:  Recent Labs Lab 08/26/16 2238  WBC 6.4  HGB 13.8  HCT 40.4  MCV 86.3  PLT 009   Basic Metabolic Panel:  Recent Labs Lab 08/26/16 2238  NA 137  K 4.2  CL 107  CO2 21*  GLUCOSE 115*  BUN 15  CREATININE 1.25*    CALCIUM 9.2   GFR: Estimated Creatinine Clearance: 71.9 mL/min (by C-G formula based on SCr of 1.25 mg/dL (H)). Liver Function Tests: No results for input(s): AST, ALT, ALKPHOS, BILITOT, PROT, ALBUMIN in the last 168 hours.  Recent Labs Lab 08/26/16 2353  LIPASE 27   No results for input(s): AMMONIA in the last 168 hours. Coagulation Profile: No results for input(s): INR, PROTIME in the last 168 hours. Cardiac Enzymes: No  results for input(s): CKTOTAL, CKMB, CKMBINDEX, TROPONINI in the last 168 hours. BNP (last 3 results) No results for input(s): PROBNP in the last 8760 hours. HbA1C: No results for input(s): HGBA1C in the last 72 hours. CBG: No results for input(s): GLUCAP in the last 168 hours. Lipid Profile: No results for input(s): CHOL, HDL, LDLCALC, TRIG, CHOLHDL, LDLDIRECT in the last 72 hours. Thyroid Function Tests: No results for input(s): TSH, T4TOTAL, FREET4, T3FREE, THYROIDAB in the last 72 hours. Anemia Panel: No results for input(s): VITAMINB12, FOLATE, FERRITIN, TIBC, IRON, RETICCTPCT in the last 72 hours. Urine analysis:    Component Value Date/Time   COLORURINE YELLOW 01/07/2014 0015   APPEARANCEUR CLEAR 01/07/2014 0015   LABSPEC 1.025 01/07/2014 0015   PHURINE 6.0 01/07/2014 0015   GLUCOSEU NEGATIVE 01/07/2014 0015   HGBUR NEGATIVE 01/07/2014 0015   BILIRUBINUR NEGATIVE 01/07/2014 0015   KETONESUR NEGATIVE 01/07/2014 0015   PROTEINUR NEGATIVE 01/07/2014 0015   UROBILINOGEN 1.0 01/07/2014 0015   NITRITE NEGATIVE 01/07/2014 0015   LEUKOCYTESUR NEGATIVE 01/07/2014 0015   Sepsis Labs: @LABRCNTIP (procalcitonin:4,lacticidven:4) )No results found for this or any previous visit (from the past 240 hour(s)).   Radiological Exams on Admission: Dg Chest 2 View  Result Date: 08/26/2016 CLINICAL DATA:  Right-sided chest pain EXAM: CHEST  2 VIEW COMPARISON:  01/06/2014 FINDINGS: No acute pulmonary infiltrate, consolidation, or pleural effusion is visualized.  Stable cardiomediastinal silhouette. No pneumothorax. IMPRESSION: No radiographic evidence for acute cardiopulmonary abnormality. Electronically Signed   By: Donavan Foil M.D.   On: 08/26/2016 23:59    EKG: Independently reviewed. Normal sinus rhythm with first-degree AV block. Second EKG shows second-degree AV block type II.  Assessment/Plan Principal Problem:   Chest pain Active Problems:   Renal transplant recipient   Essential hypertension   HLD (hyperlipidemia)   Type 2 diabetes mellitus with vascular disease ([place])    1. Chest pain concerning for unstable angina - patient has had cardiac cath in 2013 and at that time was planned medical management. Patient's symptoms are concerning for unstable angina. Patient is placed on antiplatelet agents statins. Cycle cardiac markers. Check 2-D echo and consult cardiology in a.m. Patient is nothing by mouth in anticipation of cardiac procedure. 2. Transient type II second-degree AV block - discussed on-call cardiologist who advised patient to be kept off beta blockers for now. Closely observe in telemetry. 3. Hypertension on lisinopril and now on aspirin 4. Diabetes mellitus type 2 on Actos and I placed patient on sliding scale coverage. 5. Renal transplant on immunosuppressants. 6. Hyperlipidemia on statins.   DVT prophylaxis: Lovenox. Code Status: Full code.  Family Communication: Discussed with patient.  Disposition Plan: Home.  Consults called: None. Discussed with cardiologist.  Admission status: Observation.    Rise Patience MD [facility] Pager [phone].  If 7PM-7AM, please contact night-coverage [email or web address] Password [email or web address]  08/27/2016, 3:21 AM

## 2016-08-27 NOTE — Care Management Obs Status (Signed)
[place] NOTIFICATION   Patient Details  Name: Theodore Arellano MRN: [id number] Date of Birth: November 11, 1960   Medicare Observation Status Notification Given:  Yes    Dawayne Patricia, RN 08/27/2016, 4:33 PM

## 2016-08-27 NOTE — Progress Notes (Signed)
  Echocardiogram 2D Echocardiogram has been performed.  Theodore Arellano 08/27/2016, 5:24 PM

## 2016-08-27 NOTE — Discharge Summary (Signed)
Physician Discharge Summary  Theodore Arellano NGE:[id number] DOB: [birth year] DOA: 08/26/2016  PCP: [name], MD  Admit date: 08/26/2016 Discharge date: 08/27/2016   Recommendations for Outpatient Follow-Up:   Continued risk factor modifications-- stop smoking  Discharge Diagnosis:   Principal Problem:   Chest pain Active Problems:   Renal transplant recipient   Essential hypertension   HLD (hyperlipidemia)   Type 2 diabetes mellitus with vascular disease [facility])   Discharge disposition:  Home.    Discharge Condition: Improved.  Diet recommendation: Low sodium, heart healthy.  Wound care: None.   History of Present Illness:   Theodore Arellano is a 55 y.o. male with CAD, renal transplant, hyperlipidemia, hypertension and diabetes mellitus type 2 presents with the ER because of chest pain. Patient states over the last few days has been having experiencing chest pain on exertion. Pain lasts for a few minutes each time. Retrosternal radiating to the back relieves on resting. At this time patient is chest pain-free. Cardiac markers is negative and EKG is showing normal sinus rhythm with first-degree AV block.  After admission patient had transient type II second-degree AV block.   Hospital Course by Problem:   Chest pain: Since his troponins are essentially negative, and there are several atypical features to his chest pain, he would prefer to hold off on any type of invasive testing. I think is reasonable. He will follow-up with his cardiologist. I did explain to him that if he had a significant troponin elevation, we would be forced to perform an angiogram, despite the risks to his transplanted kidney. He understands this.  He is reassured that his troponins were not significantly elevated.  Stress test would not be helpful since he has known disease.   He wants to go home ASAP.  He will return if sx worsen    Medical Consultants:    cards   Discharge Exam:    Vitals:   08/27/16 0600 08/27/16 1457  BP: (!) 144/78 (!) 95/52  Pulse: 65 65  Resp: 18 20  Temp: 98 F (36.7 C) 98 F (36.7 C)   Vitals:   08/27/16 0055 08/27/16 0600 08/27/16 0934 08/27/16 1457  BP: (!) 141/95 (!) 144/78  (!) 95/52  Pulse: 63 65  65  Resp: 18 18  20   Temp: 98 F (36.7 C) 98 F (36.7 C)  98 F (36.7 C)  TempSrc: Oral Oral  Oral  SpO2: 99% 100% 99% 99%  Weight: 90 kg (198 lb 6.4 oz)     Height: 5\' 8"  (1.727 m)       Gen:  Anxious to be discharged   The results of significant diagnostics from this hospitalization (including imaging, microbiology, ancillary and laboratory) are listed below for reference.     Procedures and Diagnostic Studies:   Dg Chest 2 View  Result Date: 08/26/2016 CLINICAL DATA:  Right-sided chest pain EXAM: CHEST  2 VIEW COMPARISON:  01/06/2014 FINDINGS: No acute pulmonary infiltrate, consolidation, or pleural effusion is visualized. Stable cardiomediastinal silhouette. No pneumothorax. IMPRESSION: No radiographic evidence for acute cardiopulmonary abnormality. Electronically Signed   By: Donavan Foil M.D.   On: 08/26/2016 23:59     Labs:   Basic Metabolic Panel:  Recent Labs Lab 08/26/16 2238  NA 137  K 4.2  CL 107  CO2 21*  GLUCOSE 115*  BUN 15  CREATININE 1.25*  CALCIUM 9.2   GFR Estimated Creatinine Clearance: 71.9 mL/min (by C-G formula based on SCr of 1.25 mg/dL (H)).  Liver Function Tests: No results for input(s): AST, ALT, ALKPHOS, BILITOT, PROT, ALBUMIN in the last 168 hours.  Recent Labs Lab 08/26/16 2353  LIPASE 27   No results for input(s): AMMONIA in the last 168 hours. Coagulation profile  Recent Labs Lab 08/27/16 1318  INR 0.98    CBC:  Recent Labs Lab 08/26/16 2238  WBC 6.4  HGB 13.8  HCT 40.4  MCV 86.3  PLT 184   Cardiac Enzymes:  Recent Labs Lab 08/27/16 0342 08/27/16 0927 08/27/16 1318  TROPONINI <0.03 0.03* 0.03*   BNP: Invalid input(s): POCBNP CBG:  Recent  Labs Lab 08/27/16 0617 08/27/16 1118 08/27/16 1633  GLUCAP 112* 105* 108*   D-Dimer No results for input(s): DDIMER in the last 72 hours. Hgb A1c No results for input(s): HGBA1C in the last 72 hours. Lipid Profile No results for input(s): CHOL, HDL, LDLCALC, TRIG, CHOLHDL, LDLDIRECT in the last 72 hours. Thyroid function studies No results for input(s): TSH, T4TOTAL, T3FREE, THYROIDAB in the last 72 hours.  Invalid input(s): FREET3 Anemia work up No results for input(s): VITAMINB12, FOLATE, FERRITIN, TIBC, IRON, RETICCTPCT in the last 72 hours. Microbiology No results found for this or any previous visit (from the past 240 hour(s)).   Discharge Instructions:   Discharge Instructions    Diet - low sodium heart healthy    Complete by:  As directed    Discharge instructions    Complete by:  As directed    Outpatient echo Stop smoking Stop alcohol   Increase activity slowly    Complete by:  As directed        Medication List    STOP taking these medications   carvedilol 25 MG tablet Commonly known as:  COREG   simvastatin 40 MG tablet Commonly known as:  ZOCOR     TAKE these medications   ALPRAZolam 1 MG tablet Commonly known as:  XANAX Take 1 mg by mouth daily as needed for anxiety.   amLODipine 10 MG tablet Commonly known as:  NORVASC Take 10 mg by mouth daily.   aspirin EC 81 MG tablet Take 81 mg by mouth daily.   atorvastatin 20 MG tablet Commonly known as:  LIPITOR Take 1 tablet (20 mg total) by mouth every Monday, Wednesday, and Friday at 8 PM.   budesonide 180 MCG/ACT inhaler Commonly known as:  PULMICORT Inhale 2 puffs into the lungs 2 (two) times daily as needed (wheezing).   colchicine 0.6 MG tablet Take 0.6 mg by mouth daily as needed (for gout).   lisinopril 10 MG tablet Commonly known as:  PRINIVIL,ZESTRIL Take 1 tablet (10 mg total) by mouth daily. Start taking on:  08/28/2016 What changed:  how much to take  when to take  this  reasons to take this   magnesium oxide 400 MG tablet Commonly known as:  MAG-OX Take 400 mg by mouth 2 (two) times daily.   mycophenolate 360 MG Tbec EC tablet Commonly known as:  MYFORTIC Take 720 mg by mouth 2 (two) times daily.   NITROSTAT 0.4 MG SL tablet Generic drug:  nitroGLYCERIN Place 0.4 mg under the tongue every 5 (five) minutes as needed for chest pain.   omeprazole 20 MG capsule Commonly known as:  PRILOSEC Take 20 mg by mouth daily.   pioglitazone 15 MG tablet Commonly known as:  ACTOS Take 15 mg by mouth daily as needed (for sugar).   predniSONE 5 MG tablet Commonly known as:  DELTASONE Take 5 mg by  mouth every morning.   ranolazine 500 MG 12 hr tablet Commonly known as:  RANEXA Take 500 mg by mouth 2 (two) times daily.   sodium bicarbonate 650 MG tablet Take 1,300 mg by mouth 2 (two) times daily.   tacrolimus 1 MG capsule Commonly known as:  PROGRAF Take 2-3 mg by mouth 2 (two) times daily. Take 3 mg every AM and 2 mg every PM   triamcinolone cream 0.1 % Commonly known as:  KENALOG Apply 1 application topically as needed (for skin).         Time coordinating discharge: 35 min  Signed:  Haddy Mullinax U Aslyn Cottman   [facility] 08/27/2016, 4:38 PM

## 2016-08-27 NOTE — Progress Notes (Signed)
CRITICAL VALUE ALERT  Critical value received:Troponon 0.03  Date of notification:  08-27-16  Time of notification:  5189  Critical value read back:Yes.    Nurse who received alert:  Shayne Alken RN  MD notified (1st page):  Dr Eliseo Squires   Time of first page: 1053 Dr Eliseo Squires on floor   MD notified (2nd page):  Time of second page:  Responding MD:  Dr Eliseo Squires was on floor   Time MD responded:  1053   Cardiology consulted

## 2016-08-27 NOTE — Progress Notes (Signed)
Patient admitted after midnight, please see H&P.  Appreciate cardiology consult  Eulogio Bear DO

## 2016-08-27 NOTE — Consult Note (Signed)
CARDIOLOGY CONSULT NOTE   Patient ID: Theodore Arellano MRN: [id number] DOB/AGE: 11-30-1960 55 y.o.  Admit date: 08/26/2016  Requesting Physician: Dr. Eliseo Squires Primary Physician:   [name], MD Primary Cardiologist:  New- seen by Dr. Haroldine Laws in 2013. Followed in [place] by Dr. Geraldo Pitter Reason for Consultation: chest pain   HPI: Theodore Arellano is a 55 y.o. male with a history of tobacco abuse smoker, HTN, HLD, DMT2, ESRD s/p successful kidney transplant in 2011 on chronic immunosuppressives, and severe 3V calcific CAD who presented to [facility] last night with chest pain at rest.   He was seen by Dr. Sung Amabile in 2013 and underwent [facility] for progressive dyspnea. This showed severe 3V calcific CAD, normal LV function and relatively normal R heart pressures. Dr. Haroldine Laws felt like CABG was not worth the risk at that point and elected to pursue aggressive medical therapy.  He has been since followed by Dr. Geraldo Pitter in [facility] for cardiology. His He has been treated with Ranexa 500mg  BID. He continues to smoke about a 1/2 a pack per day. He is fairly complaint with his medications but does forget 1-2 x a week.   He has had two months of exertional right sided chest pain that radiates to his shoulder and back. This severely limits his activity. He has chronic dyspnea on exertion as well. Over the past two days he has noted that same chest pain that he usually gets with exertion at rest. He was doubled over in pain, clutching his chest and his mother called 58. No LE edema, orthopnea or PND. NO dizziness or syncope. No palpitations.   In the ER his creat was 1.25, GFR >60, POC troponin negative and CXR with acute abnormality. He was noted to have 2nd degree AV block on telemetry and BB has been held.   He currently is chest pain free. Patient is a difficult historian. Not interested in CABG surgery at this time. Does not really want cath.     Past Medical History:  Diagnosis Date  .  CAD (coronary artery disease)   . Hyperlipidemia   . Hypertension   . Kidney disease    s/p kidney transplant 2011     Past Surgical History:  Procedure Laterality Date  . HD access procedures    . KIDNEY TRANSPLANT  2011  . LEFT HEART CATHETERIZATION WITH CORONARY ANGIOGRAM  03/11/2012   Procedure: LEFT HEART CATHETERIZATION WITH CORONARY ANGIOGRAM;  Surgeon: Theodore Artist, MD;  Location: [facility] CATH LAB;  Service: Cardiovascular;;  . RIGHT HEART CATHETERIZATION N/A 03/11/2012   Procedure: RIGHT HEART CATH;  Surgeon: Theodore Artist, MD;  Location[facility] CATH LAB;  Service: Cardiovascular;  Laterality: N/A;    Allergies  Allergen Reactions  . Shellfish Allergy Hives  . Percocet [Oxycodone-Acetaminophen] Hives and Itching  . Tomato Hives  . Vicodin [Hydrocodone-Acetaminophen] Hives and Itching    I have reviewed the patient's current medications . amLODipine  10 mg Oral QHS  . aspirin EC  325 mg Oral Daily  . budesonide  0.5 mg Nebulization BID  . enoxaparin (LOVENOX) injection  40 mg Subcutaneous Q24H  . gabapentin  100 mg Oral BID  . insulin aspart  0-9 Units Subcutaneous TID WC  . lisinopril  10 mg Oral Daily  . magnesium oxide  400 mg Oral QPM  . mycophenolate  720 mg Oral BID  . pantoprazole  40 mg Oral Daily  . predniSONE  5 mg Oral q morning -  10a  . simvastatin  40 mg Oral Q M,W,F  . sodium bicarbonate  1,300 mg Oral BID  . tacrolimus  2 mg Oral QHS  . tacrolimus  3 mg Oral Daily    acetaminophen, ALPRAZolam, nitroGLYCERIN, ondansetron (ZOFRAN) IV, oxyCODONE-acetaminophen  Prior to Admission medications   Medication Sig Start Date End Date Taking? Authorizing Provider  amLODipine (NORVASC) 10 MG tablet Take 10 mg by mouth daily.    Yes Historical Provider, MD  aspirin EC 81 MG tablet Take 81 mg by mouth daily.   Yes Historical Provider, MD  carvedilol (COREG) 25 MG tablet Take 25 mg by mouth 2 (two) times daily with a meal.   Yes Historical Provider, MD   colchicine 0.6 MG tablet Take 0.6 mg by mouth daily as needed (for gout).    Yes Historical Provider, MD  lisinopril (PRINIVIL,ZESTRIL) 10 MG tablet Take 5 mg by mouth daily as needed (for blood pressure).    Yes Historical Provider, MD  magnesium oxide (MAG-OX) 400 MG tablet Take 400 mg by mouth 2 (two) times daily.    Yes Historical Provider, MD  mycophenolate (MYFORTIC) 360 MG TBEC Take 720 mg by mouth 2 (two) times daily.   Yes Historical Provider, MD  NITROSTAT 0.4 MG SL tablet Place 0.4 mg under the tongue every 5 (five) minutes as needed for chest pain.  07/27/14  Yes Historical Provider, MD  omeprazole (PRILOSEC) 20 MG capsule Take 20 mg by mouth daily.   Yes Historical Provider, MD  predniSONE (DELTASONE) 5 MG tablet Take 5 mg by mouth every morning.    Yes Historical Provider, MD  ranolazine (RANEXA) 500 MG 12 hr tablet Take 500 mg by mouth 2 (two) times daily.   Yes Historical Provider, MD  simvastatin (ZOCOR) 40 MG tablet Take 40 mg by mouth every Monday, Wednesday, and Friday.    Yes Historical Provider, MD  sodium bicarbonate 650 MG tablet Take 1,300 mg by mouth 2 (two) times daily.   Yes Historical Provider, MD  tacrolimus (PROGRAF) 1 MG capsule Take 2-3 mg by mouth 2 (two) times daily. Take 3 mg every AM and 2 mg every PM   Yes Historical Provider, MD  triamcinolone cream (KENALOG) 0.1 % Apply 1 application topically as needed (for skin).  05/12/14  Yes Historical Provider, MD  ALPRAZolam Duanne Moron) 1 MG tablet Take 1 mg by mouth daily as needed for anxiety.     Historical Provider, MD  budesonide (PULMICORT) 180 MCG/ACT inhaler Inhale 2 puffs into the lungs 2 (two) times daily as needed (wheezing).     Historical Provider, MD  pioglitazone (ACTOS) 15 MG tablet Take 15 mg by mouth daily as needed (for sugar).     Historical Provider, MD     Social History   Social History  . Marital status: Single    Spouse name: N/A  . Number of children: N/A  . Years of education: N/A    Occupational History  . Unemployed truck driver    Social History Main Topics  . Smoking status: Current Every Day Smoker    Packs/day: 0.50    Types: Cigarettes  . Smokeless tobacco: Never Used  . Alcohol use Yes  . Drug use: No  . Sexual activity: Yes    Birth control/ protection: None   Other Topics Concern  . Not on file   Social History Narrative  . No narrative on file    Family Status  Relation Status  . Father Deceased  .  Mother Alive   Family History  Problem Relation Age of Onset  . Hypertension Father   . Diabetes Mother     ROS:  Full 14 point review of systems complete and found to be negative unless listed above.  Physical Exam: Blood pressure (!) 144/78, pulse 65, temperature 98 F (36.7 C), temperature source Oral, resp. rate 18, height 5\' 8"  (1.727 m), weight 198 lb 6.4 oz (90 kg), SpO2 99 %.  General: Well developed, well nourished, male in no acute distress, chronically ill appearing.  Head: Eyes PERRLA, No xanthomas.   Normocephalic and atraumatic, oropharynx without edema or exudate. Lungs: CTAB Heart: HRRR S1 S2, no rub/gallop, Heart regular rate and rhythm with S1, S2 no murmur. pulses are 2+ extrem.   Neck: No carotid bruits. No lymphadenopathy. No JVD. Abdomen: Bowel sounds present, abdomen soft and non-tender without masses or hernias noted. Msk:  No spine or cva tenderness. No weakness, no joint deformities or effusions. Extremities: No clubbing or cyanosis. No LE edema.  Neuro: Alert and oriented X 3. No focal deficits noted. Psych:  Good affect, responds appropriately Skin: No rashes or lesions noted.  Labs:   Lab Results  Component Value Date   WBC 6.4 08/26/2016   HGB 13.8 08/26/2016   HCT 40.4 08/26/2016   MCV 86.3 08/26/2016   PLT 184 08/26/2016   No results for input(s): INR in the last 72 hours.   Recent Labs Lab 08/26/16 2238  NA 137  K 4.2  CL 107  CO2 21*  BUN 15  CREATININE 1.25*  CALCIUM 9.2  GLUCOSE 115*    No results found for: MG  Recent Labs  08/27/16 0342 08/27/16 0927  TROPONINI <0.03 0.03*    Recent Labs  08/26/16 2246  TROPIPOC 0.01    Lipase  Date/Time Value Ref Range Status  08/26/2016 11:53 PM 27 11 - 51 U/L Final    Echo: none, pending.   Cardiac Cath Procedure Note 03/11/12 Procedures performed: 1) Right heart cathererization 2) Selective coronary angiography 3) Left heart catheterization 4) Left ventriculogram  Findings:  RA = 6 RV = 36/4/8 PA = 36/15 (24) PCW = 9 Fick cardiac output/index = 5.7/2.9 PVR = FA sat = 95% PA sat = 71%, 72%  Ao Pressure: 140/77 (103) LV Pressure:  141/7/17 There was no signficant gradient across the aortic valve on pullback.  Left main: Severely calcified. 20% ostial stenosis.  LAD: Heavily calcified through proximal and midsection. 50-60% proximal stenosis. With diffuse 60-70% through midsection. Mild stenosis distally with good target. Small diagonal with heavy diffuse disease  LCX: Large vessel. Calcified. Large branching ramus with probable 80-90% ostial/proximal stenosis. OM-1 large branching vessel. Upper branch of OM-1 totally occluded. Lower branch of OM-1 with 80-90% stenosis. OM-2 severe distal disease  RCA: Totally occluded proximally. With prominent L->R colalterals  LV-gram done in the RAO projection by hand injection: Not well opacified but EF appears normal with severe LVH.   Assessment: 1. Severe 3-v calcific CAD 2. Normal LV function 3. Relatively normal R heart pressures.  Plan/Discussion Very difficult situation. He has severe CAD which will likely progress with his immunosuppressive regimen. However, LAD disease not critical. Thus unlikely to make CABG worth the risk at this point. Will treat with aggressive medical therapy at this point with asa and statin (LDL < 70). Needs to stop smoking! Reviewed with Dr. Burt Knack.    ECG:  Sinus rhythm with 1st degree A-V block, TWI in I and AVL  similar to previous    Radiology:  Dg Chest 2 View  Result Date: 08/26/2016 CLINICAL DATA:  Right-sided chest pain EXAM: CHEST  2 VIEW COMPARISON:  01/06/2014 FINDINGS: No acute pulmonary infiltrate, consolidation, or pleural effusion is visualized. Stable cardiomediastinal silhouette. No pneumothorax. IMPRESSION: No radiographic evidence for acute cardiopulmonary abnormality. Electronically Signed   By: Donavan Foil M.D.   On: 08/26/2016 23:59    ASSESSMENT AND PLAN:    Principal Problem:   Chest pain Active Problems:   Renal transplant recipient   Essential hypertension   HLD (hyperlipidemia)   Type 2 diabetes mellitus with vascular disease (HCC)  Theodore Arellano is a 55 y.o. male with a history of tobacco abuse smoker, HTN, HLD, DMT2, ESRD s/p successful kidney transplant in 2011 on chronic immunosuppressives, and severe 3V calcific CAD who presented to [facility] last night with chest pain at rest.   Chest pain: He is currently chest pain free. He has known severe CAD with continued tobacco abuse, immunosuppressive medications, CKD, HTN, DMT2 and HLD. Some atypical features. Patient not interested in this at this time in a cath due to the risk of contrast-induced nephropathy. He says pain is not that bad. Plan will be to follow up with his regular cardiologist, Dr. Geraldo Pitter in 1-2 weeks with a 2D ECHO.    HTN: BP with moderate control. Home Coreg discontinued given 2nd degree AV block and bradycardia noted on tele. Continue to monitor.   HLD: continue statin   ESRD s/p renal transplant: continue chronic immunosuppressive meds. This makes him more concerned about having a heart cath potentially.   DMT2: continue home regimen   2nd degree AV block: currently holding Coreg.  Signed: Angelena Form, PA-C 08/27/2016 11:51 AM  Pager [phone]  Co-Sign MD  I have examined the patient and reviewed assessment and plan and discussed with patient.  Agree with above as stated.  We spoke at  length. He states that his symptoms came on with moving his arms to wash his car. When he walks, he does not report any chest discomfort. He is limited only by low back pain. The pain that brought him to the hospital was more in his right chest right shoulder. I Personally reviewed his prior Films.  He has heavily calcified vessels which revealed difficult to intervene upon. We also discussed the possibility that eventually, he could need bypass surgery. He is concerned about any evaluation that could affect his transplanted kidney. Since his troponins are essentially negative, and there are several atypical features to his chest pain, he would prefer to hold off on any type of invasive testing. I think is reasonable. He will follow-up with his cardiologist. I did explain to him that if he had a significant troponin elevation, we would be forced to perform an angiogram, despite the risks to his transplanted kidney. He understands this.  He is reassured that his troponins were not significantly elevated.  Stress test would not be helpful since he has known disease.   He wants to go home ASAP.  He will return if sx worsen.  Larae Grooms

## 2016-08-28 SURGERY — LEFT HEART CATH AND CORONARY ANGIOGRAPHY

## 2016-11-13 DIAGNOSIS — I10 Essential (primary) hypertension: Secondary | ICD-10-CM | POA: Diagnosis not present

## 2016-11-13 DIAGNOSIS — E785 Hyperlipidemia, unspecified: Secondary | ICD-10-CM | POA: Diagnosis not present

## 2016-11-13 DIAGNOSIS — R9439 Abnormal result of other cardiovascular function study: Secondary | ICD-10-CM | POA: Diagnosis not present

## 2016-11-13 DIAGNOSIS — Z940 Kidney transplant status: Secondary | ICD-10-CM | POA: Diagnosis not present

## 2016-11-13 DIAGNOSIS — F17200 Nicotine dependence, unspecified, uncomplicated: Secondary | ICD-10-CM | POA: Diagnosis not present

## 2016-11-27 DIAGNOSIS — Z940 Kidney transplant status: Secondary | ICD-10-CM | POA: Diagnosis not present

## 2016-11-27 DIAGNOSIS — E785 Hyperlipidemia, unspecified: Secondary | ICD-10-CM | POA: Diagnosis not present

## 2016-11-27 DIAGNOSIS — R9439 Abnormal result of other cardiovascular function study: Secondary | ICD-10-CM | POA: Diagnosis not present

## 2016-11-27 DIAGNOSIS — I10 Essential (primary) hypertension: Secondary | ICD-10-CM | POA: Diagnosis not present

## 2016-11-28 ENCOUNTER — Ambulatory Visit (INDEPENDENT_AMBULATORY_CARE_PROVIDER_SITE_OTHER): Payer: Medicare Other | Admitting: Sports Medicine

## 2016-11-28 ENCOUNTER — Encounter: Payer: Self-pay | Admitting: Sports Medicine

## 2016-11-28 DIAGNOSIS — B351 Tinea unguium: Secondary | ICD-10-CM | POA: Diagnosis not present

## 2016-11-28 DIAGNOSIS — M79676 Pain in unspecified toe(s): Secondary | ICD-10-CM

## 2016-11-28 NOTE — Progress Notes (Signed)
Subjective: Theodore Arellano is a 56 y.o. male patient seen today in office with complaint of painful thickened and elongated toenails; unable to trim. Patient denies history of Diabetes, Neuropathy, or Vascular disease. History of back issues with a little tingling in arms and legs. Patient has no other pedal complaints at this time.   Patient Active Problem List   Diagnosis Date Noted  . Chest pain 08/27/2016  . Renal transplant recipient 08/27/2016  . Essential hypertension 08/27/2016  . HLD (hyperlipidemia) 08/27/2016  . Type 2 diabetes mellitus with vascular disease ([place]) 08/27/2016  . Left groin pain 06/24/2013    Current Outpatient Prescriptions on File Prior to Visit  Medication Sig Dispense Refill  . ALPRAZolam (XANAX) 1 MG tablet Take 1 mg by mouth daily as needed for anxiety.     Marland Kitchen amLODipine (NORVASC) 10 MG tablet Take 10 mg by mouth daily.     Marland Kitchen aspirin EC 81 MG tablet Take 81 mg by mouth daily.    Marland Kitchen atorvastatin (LIPITOR) 20 MG tablet Take 1 tablet (20 mg total) by mouth every Monday, Wednesday, and Friday at 8 PM. 30 tablet 0  . budesonide (PULMICORT) 180 MCG/ACT inhaler Inhale 2 puffs into the lungs 2 (two) times daily as needed (wheezing).     . colchicine 0.6 MG tablet Take 0.6 mg by mouth daily as needed (for gout).     Marland Kitchen lisinopril (PRINIVIL,ZESTRIL) 10 MG tablet Take 1 tablet (10 mg total) by mouth daily. 30 tablet 0  . magnesium oxide (MAG-OX) 400 MG tablet Take 400 mg by mouth 2 (two) times daily.     . mycophenolate (MYFORTIC) 360 MG TBEC Take 720 mg by mouth 2 (two) times daily.    Marland Kitchen NITROSTAT 0.4 MG SL tablet Place 0.4 mg under the tongue every 5 (five) minutes as needed for chest pain.   1  . omeprazole (PRILOSEC) 20 MG capsule Take 20 mg by mouth daily.    . pioglitazone (ACTOS) 15 MG tablet Take 15 mg by mouth daily as needed (for sugar).     . predniSONE (DELTASONE) 5 MG tablet Take 5 mg by mouth every morning.     . ranolazine (RANEXA) 500 MG 12 hr tablet Take  500 mg by mouth 2 (two) times daily.    . sodium bicarbonate 650 MG tablet Take 1,300 mg by mouth 2 (two) times daily.    . tacrolimus (PROGRAF) 1 MG capsule Take 2-3 mg by mouth 2 (two) times daily. Take 3 mg every AM and 2 mg every PM    . triamcinolone cream (KENALOG) 0.1 % Apply 1 application topically as needed (for skin).   0   No current facility-administered medications on file prior to visit.     Allergies  Allergen Reactions  . Percocet [Oxycodone-Acetaminophen] Hives and Itching  . Vicodin [Hydrocodone-Acetaminophen] Hives and Itching  . Shellfish Allergy Hives  . Tomato Hives    Objective: Physical Exam  General: Well developed, nourished, no acute distress, awake, alert and oriented x 3  Vascular: Dorsalis pedis artery 2/4 bilateral, Posterior tibial artery 1/4 bilateral, skin temperature warm to warm proximal to distal bilateral lower extremities, no varicosities, pedal hair present bilateral.  Neurological: Gross sensation present via light touch bilateral.   Dermatological: Skin is warm, dry, and supple bilateral, Nails 1-10 are tender, long, thick, and discolored with mild subungal debris, no webspace macerations present bilateral, no open lesions present bilateral, no callus/corns/hyperkeratotic tissue present bilateral. No signs of infection bilateral.  Musculoskeletal: No symptomatic boney deformities noted bilateral. Muscular strength within normal limits without painon range of motion. No pain with calf compression bilateral.  Assessment and Plan:  Problem List Items Addressed This Visit    None    Visit Diagnoses    Onychomycosis    -  Primary   Pain of toe, unspecified laterality          -Examined patient.  -Discussed treatment options for painful mycotic nails. -Mechanically debrided and reduced mycotic nails with sterile nail nipper and dremel nail file without incident. -Recommend patient to have back check for likely radicular neuritis  -Patient  to return in 3 months for follow up evaluation or sooner if symptoms worsen.  Landis Martins, DPM

## 2016-12-26 DIAGNOSIS — N2581 Secondary hyperparathyroidism of renal origin: Secondary | ICD-10-CM | POA: Diagnosis not present

## 2016-12-26 DIAGNOSIS — Z720 Tobacco use: Secondary | ICD-10-CM | POA: Diagnosis not present

## 2016-12-26 DIAGNOSIS — E669 Obesity, unspecified: Secondary | ICD-10-CM | POA: Diagnosis not present

## 2016-12-26 DIAGNOSIS — Z940 Kidney transplant status: Secondary | ICD-10-CM | POA: Diagnosis not present

## 2016-12-26 DIAGNOSIS — E785 Hyperlipidemia, unspecified: Secondary | ICD-10-CM | POA: Diagnosis not present

## 2016-12-26 DIAGNOSIS — E1129 Type 2 diabetes mellitus with other diabetic kidney complication: Secondary | ICD-10-CM | POA: Diagnosis not present

## 2016-12-26 DIAGNOSIS — I129 Hypertensive chronic kidney disease with stage 1 through stage 4 chronic kidney disease, or unspecified chronic kidney disease: Secondary | ICD-10-CM | POA: Diagnosis not present

## 2016-12-26 DIAGNOSIS — N189 Chronic kidney disease, unspecified: Secondary | ICD-10-CM | POA: Diagnosis not present

## 2016-12-26 DIAGNOSIS — D631 Anemia in chronic kidney disease: Secondary | ICD-10-CM | POA: Diagnosis not present

## 2017-02-27 ENCOUNTER — Ambulatory Visit: Payer: Medicare Other | Admitting: Sports Medicine

## 2017-07-15 DIAGNOSIS — I10 Essential (primary) hypertension: Secondary | ICD-10-CM | POA: Diagnosis not present

## 2017-07-22 DIAGNOSIS — E785 Hyperlipidemia, unspecified: Secondary | ICD-10-CM | POA: Diagnosis not present

## 2017-07-22 DIAGNOSIS — Z940 Kidney transplant status: Secondary | ICD-10-CM | POA: Diagnosis not present

## 2017-07-22 DIAGNOSIS — N2581 Secondary hyperparathyroidism of renal origin: Secondary | ICD-10-CM | POA: Diagnosis not present

## 2017-08-19 DIAGNOSIS — Z23 Encounter for immunization: Secondary | ICD-10-CM | POA: Diagnosis not present

## 2017-09-02 DIAGNOSIS — Z720 Tobacco use: Secondary | ICD-10-CM | POA: Insufficient documentation

## 2017-10-28 DIAGNOSIS — Z940 Kidney transplant status: Secondary | ICD-10-CM | POA: Diagnosis not present

## 2017-11-27 ENCOUNTER — Emergency Department (HOSPITAL_COMMUNITY): Payer: Medicare Other

## 2017-11-27 ENCOUNTER — Encounter (HOSPITAL_COMMUNITY): Payer: Self-pay | Admitting: Emergency Medicine

## 2017-11-27 ENCOUNTER — Inpatient Hospital Stay (HOSPITAL_COMMUNITY)
Admission: EM | Admit: 2017-11-27 | Discharge: 2017-12-02 | DRG: 287 | Disposition: A | Discharge status: Home / Self Care | Payer: Medicare Other | Attending: Internal Medicine | Admitting: Internal Medicine

## 2017-11-27 DIAGNOSIS — E785 Hyperlipidemia, unspecified: Secondary | ICD-10-CM | POA: Diagnosis present

## 2017-11-27 DIAGNOSIS — I2510 Atherosclerotic heart disease of native coronary artery without angina pectoris: Secondary | ICD-10-CM

## 2017-11-27 DIAGNOSIS — Z833 Family history of diabetes mellitus: Secondary | ICD-10-CM | POA: Diagnosis not present

## 2017-11-27 DIAGNOSIS — J449 Chronic obstructive pulmonary disease, unspecified: Secondary | ICD-10-CM | POA: Diagnosis not present

## 2017-11-27 DIAGNOSIS — Z79899 Other long term (current) drug therapy: Secondary | ICD-10-CM

## 2017-11-27 DIAGNOSIS — Z8249 Family history of ischemic heart disease and other diseases of the circulatory system: Secondary | ICD-10-CM | POA: Diagnosis not present

## 2017-11-27 DIAGNOSIS — E1151 Type 2 diabetes mellitus with diabetic peripheral angiopathy without gangrene: Secondary | ICD-10-CM | POA: Diagnosis present

## 2017-11-27 DIAGNOSIS — F17210 Nicotine dependence, cigarettes, uncomplicated: Secondary | ICD-10-CM | POA: Diagnosis not present

## 2017-11-27 DIAGNOSIS — E1159 Type 2 diabetes mellitus with other circulatory complications: Secondary | ICD-10-CM | POA: Diagnosis not present

## 2017-11-27 DIAGNOSIS — I25110 Atherosclerotic heart disease of native coronary artery with unstable angina pectoris: Principal | ICD-10-CM | POA: Diagnosis present

## 2017-11-27 DIAGNOSIS — M545 Low back pain: Secondary | ICD-10-CM | POA: Diagnosis present

## 2017-11-27 DIAGNOSIS — I2583 Coronary atherosclerosis due to lipid rich plaque: Secondary | ICD-10-CM

## 2017-11-27 DIAGNOSIS — M549 Dorsalgia, unspecified: Secondary | ICD-10-CM | POA: Diagnosis not present

## 2017-11-27 DIAGNOSIS — R0789 Other chest pain: Secondary | ICD-10-CM

## 2017-11-27 DIAGNOSIS — R079 Chest pain, unspecified: Secondary | ICD-10-CM | POA: Diagnosis not present

## 2017-11-27 DIAGNOSIS — I10 Essential (primary) hypertension: Secondary | ICD-10-CM | POA: Diagnosis not present

## 2017-11-27 DIAGNOSIS — F419 Anxiety disorder, unspecified: Secondary | ICD-10-CM | POA: Diagnosis present

## 2017-11-27 DIAGNOSIS — M25511 Pain in right shoulder: Secondary | ICD-10-CM | POA: Diagnosis present

## 2017-11-27 DIAGNOSIS — Z7982 Long term (current) use of aspirin: Secondary | ICD-10-CM

## 2017-11-27 DIAGNOSIS — Z940 Kidney transplant status: Secondary | ICD-10-CM | POA: Diagnosis not present

## 2017-11-27 DIAGNOSIS — G8929 Other chronic pain: Secondary | ICD-10-CM | POA: Diagnosis present

## 2017-11-27 DIAGNOSIS — R7989 Other specified abnormal findings of blood chemistry: Secondary | ICD-10-CM

## 2017-11-27 DIAGNOSIS — Z7952 Long term (current) use of systemic steroids: Secondary | ICD-10-CM

## 2017-11-27 DIAGNOSIS — Z91013 Allergy to seafood: Secondary | ICD-10-CM

## 2017-11-27 DIAGNOSIS — R778 Other specified abnormalities of plasma proteins: Secondary | ICD-10-CM

## 2017-11-27 DIAGNOSIS — M25512 Pain in left shoulder: Secondary | ICD-10-CM | POA: Diagnosis present

## 2017-11-27 HISTORY — DX: Personal history of other diseases of the musculoskeletal system and connective tissue: Z87.39

## 2017-11-27 HISTORY — DX: Other chronic pain: G89.29

## 2017-11-27 HISTORY — DX: Personal history of immunosuppression therapy: Z92.25

## 2017-11-27 HISTORY — DX: Unspecified osteoarthritis, unspecified site: M19.90

## 2017-11-27 HISTORY — DX: Low back pain, unspecified: M54.50

## 2017-11-27 HISTORY — DX: Low back pain: M54.5

## 2017-11-27 HISTORY — DX: Anxiety disorder, unspecified: F41.9

## 2017-11-27 LAB — I-STAT TROPONIN, ED
Troponin i, poc: 0.04 ng/mL (ref 0.00–0.08)
Troponin i, poc: 0.05 ng/mL (ref 0.00–0.08)

## 2017-11-27 LAB — CBC WITH DIFFERENTIAL/PLATELET
Basophils Absolute: 0 10*3/uL (ref 0.0–0.1)
Basophils Relative: 0 %
Eosinophils Absolute: 0.1 10*3/uL (ref 0.0–0.7)
Eosinophils Relative: 2 %
HCT: 41.7 % (ref 39.0–52.0)
Hemoglobin: 14.1 g/dL (ref 13.0–17.0)
Lymphocytes Relative: 17 %
Lymphs Abs: 1.2 10*3/uL (ref 0.7–4.0)
MCH: 30.3 pg (ref 26.0–34.0)
MCHC: 33.8 g/dL (ref 30.0–36.0)
MCV: 89.5 fL (ref 78.0–100.0)
Monocytes Absolute: 0.4 10*3/uL (ref 0.1–1.0)
Monocytes Relative: 5 %
Neutro Abs: 5.1 10*3/uL (ref 1.7–7.7)
Neutrophils Relative %: 76 %
Platelets: 180 10*3/uL (ref 150–400)
RBC: 4.66 MIL/uL (ref 4.22–5.81)
RDW: 14.5 % (ref 11.5–15.5)
WBC: 6.8 10*3/uL (ref 4.0–10.5)

## 2017-11-27 LAB — COMPREHENSIVE METABOLIC PANEL
ALT: 20 U/L (ref 17–63)
AST: 23 U/L (ref 15–41)
Albumin: 3.5 g/dL (ref 3.5–5.0)
Alkaline Phosphatase: 58 U/L (ref 38–126)
Anion gap: 13 (ref 5–15)
BUN: 25 mg/dL — ABNORMAL HIGH (ref 6–20)
CO2: 19 mmol/L — ABNORMAL LOW (ref 22–32)
Calcium: 9.6 mg/dL (ref 8.9–10.3)
Chloride: 105 mmol/L (ref 101–111)
Creatinine, Ser: 1.39 mg/dL — ABNORMAL HIGH (ref 0.61–1.24)
GFR calc Af Amer: >60 mL/min (ref >60)
GFR calc non Af Amer: 55 mL/min — ABNORMAL LOW (ref >60)
Glucose, Bld: 144 mg/dL — ABNORMAL HIGH (ref 65–99)
Potassium: 4.8 mmol/L (ref 3.5–5.1)
Sodium: 137 mmol/L (ref 135–145)
Total Bilirubin: 1.2 mg/dL (ref 0.3–1.2)
Total Protein: 5.8 g/dL — ABNORMAL LOW (ref 6.5–8.1)

## 2017-11-27 LAB — CBG MONITORING, ED: Glucose-Capillary: 120 mg/dL — ABNORMAL HIGH (ref 65–99)

## 2017-11-27 LAB — LIPASE, BLOOD: Lipase: 25 U/L (ref 11–51)

## 2017-11-27 NOTE — H&P (Signed)
TRH H&P   Patient Demographics:    Theodore Arellano, is a 57 y.o. male  MRN: [id number]   DOB - 1961/08/11  Admit Date - 11/27/2017  Outpatient Primary MD for the patient is [name], MD  Referring MD/NP/PA: Wyn Quaker PA  Outpatient Specialists:  Seen by Dr. Haroldine Laws in 2013, follwed in [place] by Dr. Geraldo Pitter  Patient coming from: home  Chief Complaint  Patient presents with  . Back Pain  . Chest Pain      HPI:    Theodore Arellano  is a 57 y.o. male, w nicotine dependence, hypertension, hyperlipidemia, dm2, esrd s/p renal transplant in 2011 on chronic immunosuppresive therapy w severe 3V calcicific CAD on cardiac catheterization in 2013 started to have back /; chest pain this afternoon about 3pm.  Pt was driving in his car when he experience chest pain,  Lasted for about 30 min.  Pt was given nitro by EMS but his pain was already fading by that time.  Pt states nothing appeared to make the pain better or worse.  Pt denies fever, chills, palp, cough, sob, n/v, diarrhea, brbpr.   Pt was brought to ED for evaluation.   In ED,  CXR  IMPRESSION: No acute abnormality. Minimal changes of COPD and chronic bronchitis.  Na 137, K 4.8, Glucose 144,  Bun 25, Creatinine 1.39 Ast 23, Alt 20  Wbc 6.8, Hgb 14.1, Plt 180 Trop 0.04 Glucose 120  EKG nsr at 72, nl axis, Q in 2, 3, avf, and t inversion in v1-4, avl (T inversion new from 08/27/2016)  Pt will be admitted for w/u of chest pain    Review of systems:    In addition to the HPI above, No Fever-chills, No Headache, No changes with Vision or hearing, No problems swallowing food or Liquids, No Cough or Shortness of Breath, No Abdominal pain, No Nausea or Vommitting, Bowel movements are regular, No Blood in stool or Urine, No dysuria, No new skin rashes or bruises, No new joints pains-aches,  No new  weakness, tingling, numbness in any extremity, No recent weight gain or loss, No polyuria, polydypsia or polyphagia, No significant Mental Stressors.  A full 10 point Review of Systems was done, except as stated above, all other Review of Systems were negative.   With Past History of the following :    Past Medical History:  Diagnosis Date  . CAD (coronary artery disease)   . Hyperlipidemia   . Hypertension   . Kidney disease    s/p kidney transplant 2011      Past Surgical History:  Procedure Laterality Date  . HD access procedures    . KIDNEY TRANSPLANT  2011  . LEFT HEART CATHETERIZATION WITH CORONARY ANGIOGRAM  03/11/2012   Procedure: LEFT HEART CATHETERIZATION WITH CORONARY ANGIOGRAM;  Surgeon: Jolaine Artist, MD;  Location[facility] CATH LAB;  Service: Cardiovascular;;  .  RIGHT HEART CATHETERIZATION N/A 03/11/2012   Procedure: RIGHT HEART CATH;  Surgeon: Jolaine Artist, MD;  Location: [facility] CATH LAB;  Service: Cardiovascular;  Laterality: N/A;      Social History:     Social History   Tobacco Use  . Smoking status: Current Every Day Smoker    Packs/day: 0.50    Types: Cigarettes  . Smokeless tobacco: Never Used  Substance Use Topics  . Alcohol use: Yes     Lives - at home  Mobility - walks by self   Family History :     Family History  Problem Relation Age of Onset  . Hypertension Father   . Diabetes Mother       Home Medications:   Prior to Admission medications   Medication Sig Start Date End Date Taking? Authorizing Provider  ALPRAZolam Duanne Moron) 1 MG tablet Take 1 mg by mouth daily as needed for anxiety.     [provider]  amLODipine (NORVASC) 10 MG tablet Take 10 mg by mouth daily.     [provider]  aspirin EC 81 MG tablet Take 81 mg by mouth daily.    [provider]  atorvastatin (LIPITOR) 20 MG tablet Take 1 tablet (20 mg total) by mouth every Monday, Wednesday, and Friday at 8 PM. 08/27/16   Eulogio Bear U,  DO  budesonide (PULMICORT) 180 MCG/ACT inhaler Inhale 2 puffs into the lungs 2 (two) times daily as needed (wheezing).     [provider]  carvedilol (COREG) 25 MG tablet  10/14/16   [provider]  colchicine 0.6 MG tablet Take 0.6 mg by mouth daily as needed (for gout).     [provider]  lisinopril (PRINIVIL,ZESTRIL) 10 MG tablet Take 1 tablet (10 mg total) by mouth daily. 08/28/16   Geradine Girt, DO  magnesium oxide (MAG-OX) 400 MG tablet Take 400 mg by mouth 2 (two) times daily.     [provider]  mycophenolate (MYFORTIC) 360 MG TBEC Take 720 mg by mouth 2 (two) times daily.    [provider]  NITROSTAT 0.4 MG SL tablet Place 0.4 mg under the tongue every 5 (five) minutes as needed for chest pain.  07/27/14   [provider]  omeprazole (PRILOSEC) 20 MG capsule Take 20 mg by mouth daily.    [provider]  pioglitazone (ACTOS) 15 MG tablet Take 15 mg by mouth daily as needed (for sugar).     [provider]  predniSONE (DELTASONE) 5 MG tablet Take 5 mg by mouth every morning.     [provider]  ranolazine (RANEXA) 500 MG 12 hr tablet Take 500 mg by mouth 2 (two) times daily.    [provider]  sodium bicarbonate 650 MG tablet Take 1,300 mg by mouth 2 (two) times daily.    [provider]  tacrolimus (PROGRAF) 1 MG capsule Take 2-3 mg by mouth 2 (two) times daily. Take 3 mg every AM and 2 mg every PM    [provider]  triamcinolone cream (KENALOG) 0.1 % Apply 1 application topically as needed (for skin).  05/12/14   [provider]     Allergies:     Allergies  Allergen Reactions  . Percocet [Oxycodone-Acetaminophen] Hives and Itching  . Vicodin [Hydrocodone-Acetaminophen] Hives and Itching  . Shellfish Allergy Hives  . Tomato Hives     Physical Exam:   Vitals  Blood pressure (!) 166/96, pulse 69, temperature 98.2 F (  36.8 C), temperature source Oral,  resp. rate 19, height 5\' 7"  (1.702 m), weight 89.8 kg (198 lb), SpO2 100 %.   1. General  lying in bed in NAD,   2. Normal affect and insight, Not Suicidal or Homicidal, Awake Alert, Oriented X 3.  3. No F.N deficits, ALL C.Nerves Intact, Strength 5/5 all 4 extremities, Sensation intact all 4 extremities, Plantars down going.  4. Ears and Eyes appear Normal, Conjunctivae clear, PERRLA. Moist Oral Mucosa.  5. Supple Neck, No JVD, No cervical lymphadenopathy appriciated, No Carotid Bruits.  6. Symmetrical Chest wall movement, Good air movement bilaterally, CTAB.  7. RRR, No Gallops, Rubs or Murmurs, No Parasternal Heave.  8. Positive Bowel Sounds, Abdomen Soft, No tenderness, No organomegaly appriciated,No rebound -guarding or rigidity.  9.  No Cyanosis, Normal Skin Turgor, No Skin Rash or Bruise.  10. Good muscle tone,  joints appear normal , no effusions, Normal ROM.  11. No Palpable Lymph Nodes in Neck or Axillae    Data Review:    CBC Recent Labs  Lab 11/27/17 1652  WBC 6.8  HGB 14.1  HCT 41.7  PLT 180  MCV 89.5  MCH 30.3  MCHC 33.8  RDW 14.5  LYMPHSABS 1.2  MONOABS 0.4  EOSABS 0.1  BASOSABS 0.0   ------------------------------------------------------------------------------------------------------------------  Chemistries  Recent Labs  Lab 11/27/17 1652  NA 137  K 4.8  CL 105  CO2 19*  GLUCOSE 144*  BUN 25*  CREATININE 1.39*  CALCIUM 9.6  AST 23  ALT 20  ALKPHOS 58  BILITOT 1.2   ------------------------------------------------------------------------------------------------------------------ estimated creatinine clearance is 62.7 mL/min (A) (by C-G formula based on SCr of 1.39 mg/dL (H)). ------------------------------------------------------------------------------------------------------------------ No results for input(s): TSH, T4TOTAL, T3FREE, THYROIDAB in the last 72 hours.  Invalid input(s): FREET3  Coagulation profile No results for  input(s): INR, PROTIME in the last 168 hours. ------------------------------------------------------------------------------------------------------------------- No results for input(s): DDIMER in the last 72 hours. -------------------------------------------------------------------------------------------------------------------  Cardiac Enzymes No results for input(s): CKMB, TROPONINI, MYOGLOBIN in the last 168 hours.  Invalid input(s): CK ------------------------------------------------------------------------------------------------------------------ No results found for: BNP   ---------------------------------------------------------------------------------------------------------------  Urinalysis    Component Value Date/Time   COLORURINE YELLOW 01/07/2014 0015   APPEARANCEUR CLEAR 01/07/2014 0015   LABSPEC 1.025 01/07/2014 0015   PHURINE 6.0 01/07/2014 0015   GLUCOSEU NEGATIVE 01/07/2014 0015   HGBUR NEGATIVE 01/07/2014 0015   BILIRUBINUR NEGATIVE 01/07/2014 0015   KETONESUR NEGATIVE 01/07/2014 0015   PROTEINUR NEGATIVE 01/07/2014 0015   UROBILINOGEN 1.0 01/07/2014 0015   NITRITE NEGATIVE 01/07/2014 0015   LEUKOCYTESUR NEGATIVE 01/07/2014 0015    ----------------------------------------------------------------------------------------------------------------   Imaging Results:    Dg Chest 2 View  Result Date: 11/27/2017 CLINICAL DATA:  Mid back pain radiating to the central chest for the past hour. Smoker. EXAM: CHEST  2 VIEW COMPARISON:  08/26/2016. FINDINGS: Normal sized heart. Tortuous aorta. Clear lungs. The lungs are mildly hyperexpanded. Minimal peribronchial thickening. Unremarkable bones. Atheromatous abdominal aortic calcifications. IMPRESSION: No acute abnormality. Minimal changes of COPD and chronic bronchitis. Electronically Signed   By: Claudie Revering M.D.   On: 11/27/2017 18:18       Assessment & Plan:    Active Problems:   Chest pain   Essential  hypertension   Type 2 diabetes mellitus with vascular disease (HCC)    Chest pain Tele Trop I q6h x3 Check cardiac echo Cont Aspirin Cont lipitor, increase to 80mg  po qhs Cont Ranexa 500mg  po bid Cont Carvedilol 25mg  po bid Cont LIsinopril 10mg   po qday Cont Amlodipine 10mg  po qday Start NTP 1 inch topically tid NPO after midnite Cardiology consult sent by email  Hypertension Cont Carvedilol Cont Lisinopril Cont Amlodipine Start NTP  DM2 Cont Actos Fsbs q4h , ISS  Anxiety Xanax prn  ESRD s/p renal transplant Cont Myfortic Cont Prograf Cont Prednisone    DVT Prophylaxis  Lovenox - SCDs   AM Labs Ordered, also please review Full Orders  Family Communication: Admission, patients condition and plan of care including tests being ordered have been discussed with the patient who indicate understanding and agree with the plan and Code Status.  Code Status FULL CODE  Likely DC to  home  Condition GUARDED   Consults called: cardiology by email  Admission status: observation  Time spent in minutes : 45   Jani Gravel M.D on 11/27/2017 at 8:12 PM  Between 7am to 7pm - Pager - [phone]   After 7pm go to [email or web address] - password [email or web address]  [facility] - Office  [phone]
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
## 2017-11-27 NOTE — ED Triage Notes (Signed)
Per [facility] patient coming from home complaining of mid back pain that radiated into the center of his chest 1 hour ago. Patient given 324 mg aspirin and 1 nitro with complete releif. No pain on arrival. Denies SOB, dizziness,nausea. A and O x 4

## 2017-11-28 ENCOUNTER — Other Ambulatory Visit: Payer: Self-pay

## 2017-11-28 ENCOUNTER — Encounter (HOSPITAL_COMMUNITY): Payer: Self-pay | Admitting: General Practice

## 2017-11-28 ENCOUNTER — Observation Stay (HOSPITAL_BASED_OUTPATIENT_CLINIC_OR_DEPARTMENT_OTHER): Payer: Medicare Other

## 2017-11-28 DIAGNOSIS — I340 Nonrheumatic mitral (valve) insufficiency: Secondary | ICD-10-CM | POA: Diagnosis not present

## 2017-11-28 DIAGNOSIS — I351 Nonrheumatic aortic (valve) insufficiency: Secondary | ICD-10-CM | POA: Diagnosis not present

## 2017-11-28 DIAGNOSIS — E1159 Type 2 diabetes mellitus with other circulatory complications: Secondary | ICD-10-CM | POA: Diagnosis not present

## 2017-11-28 DIAGNOSIS — I10 Essential (primary) hypertension: Secondary | ICD-10-CM

## 2017-11-28 DIAGNOSIS — I2583 Coronary atherosclerosis due to lipid rich plaque: Secondary | ICD-10-CM

## 2017-11-28 DIAGNOSIS — R748 Abnormal levels of other serum enzymes: Secondary | ICD-10-CM

## 2017-11-28 DIAGNOSIS — I2510 Atherosclerotic heart disease of native coronary artery without angina pectoris: Secondary | ICD-10-CM

## 2017-11-28 DIAGNOSIS — R778 Other specified abnormalities of plasma proteins: Secondary | ICD-10-CM

## 2017-11-28 DIAGNOSIS — R079 Chest pain, unspecified: Secondary | ICD-10-CM

## 2017-11-28 DIAGNOSIS — R7989 Other specified abnormal findings of blood chemistry: Secondary | ICD-10-CM

## 2017-11-28 LAB — COMPREHENSIVE METABOLIC PANEL
ALT: 17 U/L (ref 17–63)
AST: 25 U/L (ref 15–41)
Albumin: 3.3 g/dL — ABNORMAL LOW (ref 3.5–5.0)
Alkaline Phosphatase: 51 U/L (ref 38–126)
Anion gap: 11 (ref 5–15)
BUN: 19 mg/dL (ref 6–20)
CO2: 21 mmol/L — ABNORMAL LOW (ref 22–32)
Calcium: 9.3 mg/dL (ref 8.9–10.3)
Chloride: 106 mmol/L (ref 101–111)
Creatinine, Ser: 1.39 mg/dL — ABNORMAL HIGH (ref 0.61–1.24)
GFR calc Af Amer: >60 mL/min (ref >60)
GFR calc non Af Amer: 55 mL/min — ABNORMAL LOW (ref >60)
Glucose, Bld: 168 mg/dL — ABNORMAL HIGH (ref 65–99)
Potassium: 4.1 mmol/L (ref 3.5–5.1)
Sodium: 138 mmol/L (ref 135–145)
Total Bilirubin: 0.8 mg/dL (ref 0.3–1.2)
Total Protein: 6.1 g/dL — ABNORMAL LOW (ref 6.5–8.1)

## 2017-11-28 LAB — BASIC METABOLIC PANEL
Anion gap: 12 (ref 5–15)
BUN: 19 mg/dL (ref 6–20)
CO2: 21 mmol/L — ABNORMAL LOW (ref 22–32)
Calcium: 9.4 mg/dL (ref 8.9–10.3)
Chloride: 103 mmol/L (ref 101–111)
Creatinine, Ser: 1.29 mg/dL — ABNORMAL HIGH (ref 0.61–1.24)
GFR calc Af Amer: >60 mL/min (ref >60)
GFR calc non Af Amer: 60 mL/min — ABNORMAL LOW (ref >60)
Glucose, Bld: 114 mg/dL — ABNORMAL HIGH (ref 65–99)
Potassium: 5.1 mmol/L (ref 3.5–5.1)
Sodium: 136 mmol/L (ref 135–145)

## 2017-11-28 LAB — ECHOCARDIOGRAM COMPLETE
Height: 67 in
Weight: 3168 oz

## 2017-11-28 LAB — CBG MONITORING, ED
Glucose-Capillary: 101 mg/dL — ABNORMAL HIGH (ref 65–99)
Glucose-Capillary: 145 mg/dL — ABNORMAL HIGH (ref 65–99)

## 2017-11-28 LAB — CBC
HCT: 40.3 % (ref 39.0–52.0)
Hemoglobin: 13.8 g/dL (ref 13.0–17.0)
MCH: 30.5 pg (ref 26.0–34.0)
MCHC: 34.2 g/dL (ref 30.0–36.0)
MCV: 89.2 fL (ref 78.0–100.0)
Platelets: 172 10*3/uL (ref 150–400)
RBC: 4.52 MIL/uL (ref 4.22–5.81)
RDW: 14.5 % (ref 11.5–15.5)
WBC: 5.7 10*3/uL (ref 4.0–10.5)

## 2017-11-28 LAB — TROPONIN I
Troponin I: 0.06 ng/mL (ref <0.03)
Troponin I: 0.06 ng/mL (ref <0.03)
Troponin I: 0.06 ng/mL (ref <0.03)

## 2017-11-28 LAB — GLUCOSE, CAPILLARY
Glucose-Capillary: 132 mg/dL — ABNORMAL HIGH (ref 65–99)
Glucose-Capillary: 120 mg/dL — ABNORMAL HIGH (ref 65–99)

## 2017-11-28 LAB — HIV ANTIBODY (ROUTINE TESTING W REFLEX): HIV Screen 4th Generation wRfx: NONREACTIVE

## 2017-11-28 LAB — PROTIME-INR
INR: 0.98
Prothrombin Time: 12.9 seconds (ref 11.4–15.2)

## 2017-11-28 MED ORDER — COLCHICINE 0.6 MG PO TABS: 0.6000 mg | ORAL_TABLET | Freq: Every day | ORAL | Status: DC | PRN

## 2017-11-28 MED ORDER — ASPIRIN EC 81 MG PO TBEC
81.0000 mg | DELAYED_RELEASE_TABLET | Freq: Every day | ORAL | Status: DC
  Administered 2017-11-28 – 2017-12-02 (×4): 81 mg via ORAL
  Filled 2017-11-28 (×4): qty 1

## 2017-11-28 MED ORDER — SODIUM CHLORIDE 0.9% FLUSH: 3.0000 mL | INTRAVENOUS | Status: DC | PRN

## 2017-11-28 MED ORDER — PREDNISONE 5 MG PO TABS
5.0000 mg | ORAL_TABLET | Freq: Every morning | ORAL | Status: DC
  Administered 2017-11-28: 5 mg via ORAL
  Filled 2017-11-28 (×2): qty 1

## 2017-11-28 MED ORDER — BUDESONIDE 0.5 MG/2ML IN SUSP
0.5000 mg | Freq: Two times a day (BID) | RESPIRATORY_TRACT | Status: DC
  Administered 2017-11-28 – 2017-12-02 (×8): 0.5 mg via RESPIRATORY_TRACT
  Filled 2017-11-28 (×10): qty 2

## 2017-11-28 MED ORDER — PIOGLITAZONE HCL 15 MG PO TABS
15.0000 mg | ORAL_TABLET | Freq: Every day | ORAL | Status: DC
  Administered 2017-11-30: 15 mg via ORAL
  Filled 2017-11-28 (×5): qty 1

## 2017-11-28 MED ORDER — TRAMADOL HCL 50 MG PO TABS
50.0000 mg | ORAL_TABLET | Freq: Two times a day (BID) | ORAL | Status: DC | PRN
  Filled 2017-11-28 (×2): qty 1

## 2017-11-28 MED ORDER — ATORVASTATIN CALCIUM 80 MG PO TABS: 80.0000 mg | ORAL_TABLET | Freq: Every day | ORAL | Status: DC

## 2017-11-28 MED ORDER — ACETAMINOPHEN 650 MG RE SUPP: 650.0000 mg | Freq: Four times a day (QID) | RECTAL | Status: DC | PRN

## 2017-11-28 MED ORDER — SODIUM CHLORIDE 0.9 % IV SOLN
INTRAVENOUS | Status: DC
  Administered 2017-11-28: 03:00:00 via INTRAVENOUS

## 2017-11-28 MED ORDER — NITROGLYCERIN 2 % TD OINT
1.0000 [in_us] | TOPICAL_OINTMENT | Freq: Three times a day (TID) | TRANSDERMAL | Status: DC
  Administered 2017-11-28 – 2017-11-29 (×5): 1 [in_us] via TOPICAL
  Filled 2017-11-28: qty 1
  Filled 2017-11-28: qty 30

## 2017-11-28 MED ORDER — MYCOPHENOLATE SODIUM 180 MG PO TBEC
720.0000 mg | DELAYED_RELEASE_TABLET | Freq: Two times a day (BID) | ORAL | Status: DC
  Administered 2017-11-28 – 2017-12-02 (×9): 720 mg via ORAL
  Filled 2017-11-28 (×11): qty 4

## 2017-11-28 MED ORDER — SODIUM CHLORIDE 0.9 % IV SOLN
INTRAVENOUS | Status: DC
  Administered 2017-11-29: 06:00:00 via INTRAVENOUS

## 2017-11-28 MED ORDER — PREDNISONE 5 MG PO TABS
5.0000 mg | ORAL_TABLET | Freq: Every day | ORAL | Status: DC
  Administered 2017-11-29 – 2017-12-02 (×4): 5 mg via ORAL
  Filled 2017-11-28 (×4): qty 1

## 2017-11-28 MED ORDER — MAGNESIUM OXIDE 400 (241.3 MG) MG PO TABS
400.0000 mg | ORAL_TABLET | Freq: Two times a day (BID) | ORAL | Status: DC
  Administered 2017-11-28 – 2017-12-02 (×9): 400 mg via ORAL
  Filled 2017-11-28 (×10): qty 1

## 2017-11-28 MED ORDER — PANTOPRAZOLE SODIUM 40 MG PO TBEC
40.0000 mg | DELAYED_RELEASE_TABLET | Freq: Every day | ORAL | Status: DC
  Administered 2017-11-28 – 2017-12-02 (×5): 40 mg via ORAL
  Filled 2017-11-28 (×5): qty 1

## 2017-11-28 MED ORDER — INSULIN ASPART 100 UNIT/ML ~~LOC~~ SOLN
0.0000 [IU] | SUBCUTANEOUS | Status: DC
  Administered 2017-11-28 (×2): 1 [IU] via SUBCUTANEOUS
  Administered 2017-11-29: 2 [IU] via SUBCUTANEOUS
  Administered 2017-11-29 – 2017-11-30 (×4): 1 [IU] via SUBCUTANEOUS
  Administered 2017-11-30: 2 [IU] via SUBCUTANEOUS
  Administered 2017-12-01 (×4): 1 [IU] via SUBCUTANEOUS
  Administered 2017-12-01: 2 [IU] via SUBCUTANEOUS
  Administered 2017-12-02: 1 [IU] via SUBCUTANEOUS

## 2017-11-28 MED ORDER — ASPIRIN 81 MG PO CHEW
81.0000 mg | CHEWABLE_TABLET | ORAL | Status: AC
  Administered 2017-11-29: 81 mg via ORAL
  Filled 2017-11-28: qty 1

## 2017-11-28 MED ORDER — RANOLAZINE ER 500 MG PO TB12
500.0000 mg | ORAL_TABLET | Freq: Two times a day (BID) | ORAL | Status: DC
  Administered 2017-11-28 – 2017-12-02 (×9): 500 mg via ORAL
  Filled 2017-11-28 (×9): qty 1

## 2017-11-28 MED ORDER — SODIUM BICARBONATE 650 MG PO TABS
1300.0000 mg | ORAL_TABLET | Freq: Two times a day (BID) | ORAL | Status: DC
  Administered 2017-11-28 – 2017-12-02 (×9): 1300 mg via ORAL
  Filled 2017-11-28 (×9): qty 2

## 2017-11-28 MED ORDER — SODIUM CHLORIDE 0.9% FLUSH
3.0000 mL | Freq: Two times a day (BID) | INTRAVENOUS | Status: DC
  Administered 2017-11-28: 3 mL via INTRAVENOUS

## 2017-11-28 MED ORDER — ALPRAZOLAM 0.5 MG PO TABS: 1.0000 mg | ORAL_TABLET | Freq: Every day | ORAL | Status: DC | PRN

## 2017-11-28 MED ORDER — AMLODIPINE BESYLATE 10 MG PO TABS
10.0000 mg | ORAL_TABLET | Freq: Every day | ORAL | Status: DC
  Administered 2017-11-28 – 2017-12-02 (×5): 10 mg via ORAL
  Filled 2017-11-28: qty 1
  Filled 2017-11-28: qty 2
  Filled 2017-11-28 (×4): qty 1

## 2017-11-28 MED ORDER — LISINOPRIL 10 MG PO TABS
10.0000 mg | ORAL_TABLET | Freq: Every day | ORAL | Status: DC
  Administered 2017-11-28 – 2017-12-02 (×5): 10 mg via ORAL
  Filled 2017-11-28 (×5): qty 1

## 2017-11-28 MED ORDER — ENOXAPARIN SODIUM 40 MG/0.4ML ~~LOC~~ SOLN
40.0000 mg | SUBCUTANEOUS | Status: DC
  Administered 2017-11-28: 40 mg via SUBCUTANEOUS
  Filled 2017-11-28 (×2): qty 0.4

## 2017-11-28 MED ORDER — PERFLUTREN LIPID MICROSPHERE
1.0000 mL | INTRAVENOUS | Status: AC | PRN
  Administered 2017-11-28: 5 mL via INTRAVENOUS
  Filled 2017-11-28: qty 10

## 2017-11-28 MED ORDER — SODIUM CHLORIDE 0.9 % IV SOLN
250.0000 mL | INTRAVENOUS | Status: DC | PRN
Start: 2017-11-28 — End: 2017-11-29

## 2017-11-28 MED ORDER — ACETAMINOPHEN 325 MG PO TABS: 650.0000 mg | ORAL_TABLET | Freq: Four times a day (QID) | ORAL | Status: DC | PRN

## 2017-11-28 MED ORDER — CARVEDILOL 25 MG PO TABS
25.0000 mg | ORAL_TABLET | Freq: Two times a day (BID) | ORAL | Status: DC
  Administered 2017-11-28 – 2017-12-02 (×9): 25 mg via ORAL
  Filled 2017-11-28 (×8): qty 1
  Filled 2017-11-28: qty 2

## 2017-11-28 MED ORDER — TACROLIMUS 1 MG PO CAPS
2.0000 mg | ORAL_CAPSULE | Freq: Every day | ORAL | Status: DC
  Administered 2017-11-28 – 2017-12-01 (×5): 2 mg via ORAL
  Filled 2017-11-28 (×7): qty 2

## 2017-11-28 MED ORDER — TACROLIMUS 1 MG PO CAPS: 2.0000 mg | ORAL_CAPSULE | Freq: Every day | ORAL | Status: DC

## 2017-11-28 MED ORDER — TACROLIMUS 1 MG PO CAPS
3.0000 mg | ORAL_CAPSULE | Freq: Every day | ORAL | Status: DC
  Administered 2017-11-28 – 2017-12-02 (×5): 3 mg via ORAL
  Filled 2017-11-28 (×5): qty 3

## 2017-11-28 MED ORDER — ROSUVASTATIN CALCIUM 20 MG PO TABS
20.0000 mg | ORAL_TABLET | Freq: Every day | ORAL | Status: DC
  Administered 2017-11-28 – 2017-11-29 (×2): 20 mg via ORAL
  Filled 2017-11-28 (×3): qty 1

## 2017-11-28 NOTE — ED Notes (Signed)
Multiple meds requested from pharmacy at this time.  Will administer, upon their arrival to ED

## 2017-11-28 NOTE — Progress Notes (Signed)
PROGRESS NOTE    Theodore Arellano  TAV:[id number] DOB: 04-Dec-1960 DOA: 11/27/2017 PCP: [name], MD   Outpatient Specialists:     Brief Narrative:  Theodore Arellano  is a 57 y.o. male, w nicotine dependence, hypertension, hyperlipidemia, dm2, esrd s/p renal transplant in 2011 on chronic immunosuppresive therapy w severe 3V calcicific CAD on cardiac catheterization in 2013 started to have back /; chest pain this afternoon about 3pm.  Pt was driving in his car when he experience chest pain,  Lasted for about 30 min.  Pt was given nitro by EMS but his pain was already fading by that time.  Pt states nothing appeared to make the pain better or worse.  Pt denies fever, chills, palp, cough, sob, n/v, diarrhea, brbpr.   Pt was brought to ED for evaluation.      Assessment & Plan:   Active Problems:   Chest pain   Essential hypertension   Type 2 diabetes mellitus with vascular disease (HCC)   Chest pain -known CAD but is a renal transplant patient  -echo done -cardiology consult pending -CE flat -still smoking  HTN -resume home meds  DM -SSI -on actos- defer changes to PCP  Chronic back pain -tramadol  ESRD s/p renal transplant -continue anti-rejection meds   DVT prophylaxis:   SCD's  Code Status: Full Code   Family Communication:   Disposition Plan:  After cardiology eval   Consultants:   cards     Subjective: No chest pain but has back pain  Objective: Vitals:   11/28/17 1030 11/28/17 1045 11/28/17 1100 11/28/17 1115  BP: (!) 135/110 (!) 125/96 (!) 164/83 135/80  Pulse: 70 60 (!) 58 (!) 57  Resp: 15 19 15 14   Temp:      TempSrc:      SpO2: 97% 97% 95% 97%  Weight:      Height:       No intake or output data in the 24 hours ending 11/28/17 1359 Filed Weights   11/27/17 1644  Weight: 89.8 kg (198 lb)    Examination:  General exam: Appears calm and comfortable  Respiratory system: Clear to auscultation. Respiratory effort  normal. Cardiovascular system: S1 & S2 heard, RRR. No JVD, murmurs, rubs, gallops or clicks. No pedal edema. Gastrointestinal system: Abdomen is nondistended, soft and nontender. No organomegaly or masses felt. Normal bowel sounds heard. Central nervous system: Alert and oriented. No focal neurological deficits. Extremities: Symmetric 5 x 5 power. Skin: No rashes, lesions or ulcers Psychiatry: Judgement and insight appear normal. Mood & affect appropriate.     Data Reviewed: I have personally reviewed following labs and imaging studies  CBC: Recent Labs  Lab 11/27/17 1652 11/28/17 0220  WBC 6.8 5.7  NEUTROABS 5.1  --   HGB 14.1 13.8  HCT 41.7 40.3  MCV 89.5 89.2  PLT 180 553   Basic Metabolic Panel: Recent Labs  Lab 11/27/17 1652 11/28/17 0220  NA 137 138  K 4.8 4.1  CL 105 106  CO2 19* 21*  GLUCOSE 144* 168*  BUN 25* 19  CREATININE 1.39* 1.39*  CALCIUM 9.6 9.3   GFR: Estimated Creatinine Clearance: 62.7 mL/min (A) (by C-G formula based on SCr of 1.39 mg/dL (H)). Liver Function Tests: Recent Labs  Lab 11/27/17 1652 11/28/17 0220  AST 23 25  ALT 20 17  ALKPHOS 58 51  BILITOT 1.2 0.8  PROT 5.8* 6.1*  ALBUMIN 3.5 3.3*   Recent Labs  Lab 11/27/17 1830  LIPASE 25   No results for input(s): AMMONIA in the last 168 hours. Coagulation Profile: No results for input(s): INR, PROTIME in the last 168 hours. Cardiac Enzymes: Recent Labs  Lab 11/28/17 0219 11/28/17 0942  TROPONINI 0.06* 0.06*   BNP (last 3 results) No results for input(s): PROBNP in the last 8760 hours. HbA1C: No results for input(s): HGBA1C in the last 72 hours. CBG: Recent Labs  Lab 11/27/17 1927 11/28/17 0429 11/28/17 1156  GLUCAP 120* 101* 145*   Lipid Profile: No results for input(s): CHOL, HDL, LDLCALC, TRIG, CHOLHDL, LDLDIRECT in the last 72 hours. Thyroid Function Tests: No results for input(s): TSH, T4TOTAL, FREET4, T3FREE, THYROIDAB in the last 72 hours. Anemia Panel: No  results for input(s): VITAMINB12, FOLATE, FERRITIN, TIBC, IRON, RETICCTPCT in the last 72 hours. Urine analysis:    Component Value Date/Time   COLORURINE YELLOW 01/07/2014 0015   APPEARANCEUR CLEAR 01/07/2014 0015   LABSPEC 1.025 01/07/2014 0015   PHURINE 6.0 01/07/2014 0015   GLUCOSEU NEGATIVE 01/07/2014 0015   HGBUR NEGATIVE 01/07/2014 0015   BILIRUBINUR NEGATIVE 01/07/2014 0015   KETONESUR NEGATIVE 01/07/2014 0015   PROTEINUR NEGATIVE 01/07/2014 0015   UROBILINOGEN 1.0 01/07/2014 0015   NITRITE NEGATIVE 01/07/2014 0015   LEUKOCYTESUR NEGATIVE 01/07/2014 0015     )No results found for this or any previous visit (from the past 240 hour(s)).    Anti-infectives (From admission, onward)   None       Radiology Studies: Dg Chest 2 View  Result Date: 11/27/2017 CLINICAL DATA:  Mid back pain radiating to the central chest for the past hour. Smoker. EXAM: CHEST  2 VIEW COMPARISON:  08/26/2016. FINDINGS: Normal sized heart. Tortuous aorta. Clear lungs. The lungs are mildly hyperexpanded. Minimal peribronchial thickening. Unremarkable bones. Atheromatous abdominal aortic calcifications. IMPRESSION: No acute abnormality. Minimal changes of COPD and chronic bronchitis. Electronically Signed   By: Claudie Revering M.D.   On: 11/27/2017 18:18        Scheduled Meds: . amLODipine  10 mg Oral Daily  . aspirin EC  81 mg Oral Daily  . budesonide  0.5 mg Inhalation BID  . carvedilol  25 mg Oral BID WC  . enoxaparin (LOVENOX) injection  40 mg Subcutaneous Q24H  . insulin aspart  0-9 Units Subcutaneous Q4H  . lisinopril  10 mg Oral Daily  . magnesium oxide  400 mg Oral BID  . mycophenolate  720 mg Oral BID  . nitroGLYCERIN  1 inch Topical Q8H  . pantoprazole  40 mg Oral Daily  . pioglitazone  15 mg Oral Daily  . predniSONE  5 mg Oral q morning - 10a  . ranolazine  500 mg Oral BID  . rosuvastatin  20 mg Oral q1800  . sodium bicarbonate  1,300 mg Oral BID  . tacrolimus  2 mg Oral QHS  .  tacrolimus  3 mg Oral Daily   Continuous Infusions:   LOS: 0 days    Time spent: 25 min    Geradine Girt, DO [facility] Pager [phone]  If 7PM-7AM, please contact night-coverage [email or web address] Password [facility] 11/28/2017, 1:59 PM

## 2017-11-28 NOTE — ED Notes (Signed)
Breakfast ordered; heart healthy diet

## 2017-11-28 NOTE — Consult Note (Signed)
Cardiology Consultation:    Patient ID: Theodore Arellano; [id number]; 08/31/1961   Admit date: 11/27/2017 Date of Consult: 11/28/2017  Primary Care Provider: [name], MD Primary Cardiologist: Dr. Salley Scarlet- [place]  Patient Profile:   Theodore Arellano is a 57 y.o. male with a hx of tobacco use, hypertension, hyperlipidemia, DM 2, end-stage renal disease status post renal transplant in 2011 on chronic immunosuppressive therapy, with known three-vessel CAD per cardiac cath in 2013 followed by Dr. Salley Scarlet in [place] who is being seen today for the evaluation of chest pain at the request of Dr. Maudie Mercury.  History of Present Illness:   Theodore Arellano was initially evaluated by Dr. Sung Amabile in 03/2012 and underwent a right and left heart cath for progressive dyspnea and was found to have three-vessel calcific CAD, normal LV function and relatively normal right heart pressures who was not interested in pursuing CABG surgery at that time. The decision was made to move forward with aggressive medical therapy. He is followed by Dr. Salley Scarlet in [place] for his cardiology concerns, last seen several weeks ago with no reported problems.  He has been treated with Ranexa 500 mg twice daily, continues to smoke about half a pack of cigarettes per day, and is fairly compliant with his medications.   He was again seen back in the hospital four years later on 08/27/2016 with complaints of chest pain. A cardiac cath was once again offered, however, he was not interested given the risk of contrast-induced nephropathy and his history of end-stage renal disease status post  kidney transplant in 2011. He was to follow with his regular cardiologist for follow-up 2D echo and continue with aggressive medical therapy with ASA, statin and smoking cessation.   On 11/27/2017 Theodore Arellano presented to the emergency department with complaints of chest and back pain which have intermittently been off and on for the last year and a  half.  He states that the pain typically begins in his mid back area and travels to his anterior chest and down his left arm with associated left hand tingling sensations. Yesterday he states that the pain began while driving his car and lasted for approximately 30 minutes in duration.  He called EMS given that the pain lasted longer than his previous episodes. He  reports that the pain, at its worst, is a 10/10 and typically resides to a 2/10 with movement or position change, however there is no exacerbation with walking or exertion. History of somewhat hard to follow and he reports that it is difficult for him to determine if his pain is arthritic in nature or actual chest pain from his known disease. He states that his symptoms are very similar to his last presentation in 2017.  He denies taking his home nitroglycerin for relief.  No reports of shortness of breath, diaphoresis, nausea and vomiting, orthopnea, lower extremity swelling.    In the emergency department, his initial troponin level is 0.05, however increased to 0.06, 0.06. His creatinine is 1.39.  An EKG was performed which showed normal sinus rhythm with a heart rate of 72 (new) Q waves in leads II, III, aVF, and T wave inversions in leads V1 through V4.  A chest x-ray was completed which showed no acute cardiopulmonary disease, with minimal changes of COPD and chronic bronchitis.  Echo 11/28/17 with decreased systolic function, EF 57% to 40% and extensive new wall motion abnormalities; dyskinesis of the apical myocardium, akinesis and scarring of the mid-apical inferior and  inferoseptal myocardium; consistent with ischemia in the distribution of the right coronary artery or a large &quot;wrap-around&quot; LAD artery. Severe hypokinesis of the apicalanteroseptal and anterior myocardium; consistent with ischemia in the distribution of the left anterior descending coronary artery and a substantial reduction in left ventricular systolic function.     Past Medical History:  Diagnosis Date  . CAD (coronary artery disease)   . Hyperlipidemia   . Hypertension   . Kidney disease    s/p kidney transplant 2011    Past Surgical History:  Procedure Laterality Date  . HD access procedures    . KIDNEY TRANSPLANT  2011  . LEFT HEART CATHETERIZATION WITH CORONARY ANGIOGRAM  03/11/2012   Procedure: LEFT HEART CATHETERIZATION WITH CORONARY ANGIOGRAM;  Surgeon: Jolaine Artist, MD;  Location: [facility] CATH LAB;  Service: Cardiovascular;;  . RIGHT HEART CATHETERIZATION N/A 03/11/2012   Procedure: RIGHT HEART CATH;  Surgeon: Jolaine Artist, MD;  Location: [facility] CATH LAB;  Service: Cardiovascular;  Laterality: N/A;     Prior to Admission medications   Medication Sig Start Date End Date Taking? Authorizing Provider  amLODipine (NORVASC) 10 MG tablet Take 10 mg by mouth daily.    Yes [provider]  aspirin EC 81 MG tablet Take 81 mg by mouth daily.   Yes [provider]  atorvastatin (LIPITOR) 20 MG tablet Take 1 tablet (20 mg total) by mouth every Monday, Wednesday, and Friday at 8 PM. 08/27/16  Yes Vann, Jessica U, DO  budesonide (PULMICORT) 180 MCG/ACT inhaler Inhale 2 puffs into the lungs 2 (two) times daily as needed (wheezing).    Yes [provider]  carvedilol (COREG) 25 MG tablet  10/14/16  Yes [provider]  colchicine 0.6 MG tablet Take 0.6 mg by mouth daily as needed (for gout).    Yes [provider]  lisinopril (PRINIVIL,ZESTRIL) 10 MG tablet Take 1 tablet (10 mg total) by mouth daily. 08/28/16  Yes Vann, Jessica U, DO  magnesium oxide (MAG-OX) 400 MG tablet Take 400 mg by mouth 2 (two) times daily.    Yes [provider]  mycophenolate (MYFORTIC) 360 MG TBEC Take 720 mg by mouth 2 (two) times daily.   Yes [provider]  NITROSTAT 0.4 MG SL tablet Place 0.4 mg under the tongue every 5 (five) minutes as needed for chest pain.  07/27/14  Yes [provider]   omeprazole (PRILOSEC) 20 MG capsule Take 20 mg by mouth daily.   Yes [provider]  predniSONE (DELTASONE) 5 MG tablet Take 5 mg by mouth every morning.    Yes [provider]  ranolazine (RANEXA) 500 MG 12 hr tablet Take 500 mg by mouth 2 (two) times daily.   Yes [provider]  sodium bicarbonate 650 MG tablet Take 1,300 mg by mouth 2 (two) times daily.   Yes [provider]  tacrolimus (PROGRAF) 1 MG capsule Take 2-3 mg by mouth 2 (two) times daily. Take 3 mg every AM and 2 mg every PM   Yes [provider]  traMADol (ULTRAM) 50 MG tablet Take 50-100 mg by mouth every 4 (four) hours as needed for pain. 05/29/16  Yes [provider]  triamcinolone cream (KENALOG) 0.1 % Apply 1 application topically as needed (for skin).  05/12/14  Yes [provider]  pioglitazone (ACTOS) 15 MG tablet Take 15 mg by mouth daily as needed (for sugar).     [provider]    Inpatient Medications: Scheduled  Meds: . amLODipine  10 mg Oral Daily  . aspirin EC  81 mg Oral Daily  . budesonide  0.5 mg Inhalation BID  . carvedilol  25 mg Oral BID WC  . enoxaparin (LOVENOX) injection  40 mg Subcutaneous Q24H  . insulin aspart  0-9 Units Subcutaneous Q4H  . lisinopril  10 mg Oral Daily  . magnesium oxide  400 mg Oral BID  . mycophenolate  720 mg Oral BID  . nitroGLYCERIN  1 inch Topical Q8H  . pantoprazole  40 mg Oral Daily  . pioglitazone  15 mg Oral Daily  . predniSONE  5 mg Oral q morning - 10a  . ranolazine  500 mg Oral BID  . rosuvastatin  20 mg Oral q1800  . sodium bicarbonate  1,300 mg Oral BID  . tacrolimus  2 mg Oral QHS  . tacrolimus  3 mg Oral Daily   Continuous Infusions: . sodium chloride 50 mL/hr at 11/28/17 0232   PRN Meds: acetaminophen **OR** acetaminophen, ALPRAZolam, colchicine  Allergies:    Allergies  Allergen Reactions  . Percocet [Oxycodone-Acetaminophen] Hives and Itching  . Vicodin  [Hydrocodone-Acetaminophen] Hives and Itching  . Shellfish Allergy Hives  . Tomato Hives    Social History:   Social History   Socioeconomic History  . Marital status: Single    Spouse name: Not on file  . Number of children: Not on file  . Years of education: Not on file  . Highest education level: Not on file  Social Needs  . Financial resource strain: Not on file  . Food insecurity - worry: Not on file  . Food insecurity - inability: Not on file  . Transportation needs - medical: Not on file  . Transportation needs - non-medical: Not on file  Occupational History  . Occupation: Unemployed Administrator  Tobacco Use  . Smoking status: Current Every Day Smoker    Packs/day: 0.50    Types: Cigarettes  . Smokeless tobacco: Never Used  Substance and Sexual Activity  . Alcohol use: Yes  . Drug use: No  . Sexual activity: Yes    Birth control/protection: None  Other Topics Concern  . Not on file  Social History Narrative  . Not on file    Family History:   Family History  Problem Relation Age of Onset  . Hypertension Father   . Diabetes Mother    Family Status:  Family Status  Relation Name Status  . Father  Deceased  . Mother  Alive    ROS:  Please see the history of present illness.  All other ROS reviewed and negative.     Physical Exam/Data:   Vitals:   11/28/17 0745 11/28/17 0900 11/28/17 0915 11/28/17 0930  BP: (!) 177/95 (!) 166/104 (!) 177/101 (!) 154/84  Pulse: (!) 57 (!) 58 (!) 55 66  Resp: 15 12 19 19   Temp:      TempSrc:      SpO2: 98% 100% 96% 98%  Weight:      Height:       No intake or output data in the 24 hours ending 11/28/17 1108 Filed Weights   11/27/17 1644  Weight: 198 lb (89.8 kg)   Body mass index is 31.01 kg/m.   General: Well developed, well nourished, NAD Skin: Warm, dry, intact  Head: Normocephalic, atraumatic, clear, moist mucus membranes. Neck: Negative for carotid bruits. No JVD Lungs:Clear to ausculation  bilaterally. No wheezes, rales, or rhonchi. Breathing is unlabored. Cardiovascular: RRR  with S1 S2. No murmurs, rubs, or gallops Abdomen: Soft, non-tender, non-distended with normoactive bowel sounds. No obvious abdominal masses. MSK: Strength and tone appear normal for age. 5/5 in all extremities Extremities: No edema. No clubbing or cyanosis. DP/PT pulses 2+ bilaterally Neuro: Alert and oriented. No focal deficits. No facial asymmetry. MAE spontaneously. Psych: Responds to questions appropriately with normal affect.     EKG:  The EKG was personally reviewed and demonstrates: 11/28/17 NSR HR 72 with Q waves in II, III, avF and T wave inversions in V4-V6 Telemetry:  Telemetry was personally reviewed and demonstrates: 11/28/17 NSR HR 71 with T wave inversion  Relevant CV Studies:  ECHO: 11/28/17 Study Conclusions  - Left ventricle: The cavity size was normal. There was mild   concentric hypertrophy. Systolic function was moderately reduced.   The estimated ejection fraction was in the range of 35% to 40%.   Dyskinesis of the apical myocardium. Akinesis and scarring of the   mid-apical inferior and inferoseptal myocardium; consistent with   ischemia in the distribution of the right coronary artery or a   large &quot;wrap-around&quot; LAD artery. Severe hypokinesis of the   apicalanteroseptal and anterior myocardium; consistent with   ischemia in the distribution of the left anterior descending   coronary artery. Doppler parameters are consistent with abnormal   left ventricular relaxation (grade 1 diastolic dysfunction).   Acoustic contrast opacification revealed no evidence ofthrombus. - Aortic valve: There was mild regurgitation. - Mitral valve: There was mild regurgitation.  Impressions:  - Compared to November 2017, there are extensive new wall motion   abnormalities and a substantial reduction in left ventricular   systolic function.   CATH:  03/11/12 Procedures  performed: 1) Right heart cathererization 2) Selective coronary angiography 3) Left heart catheterization 4) Left ventriculogram  Findings:  RA = 6 RV = 36/4/8 PA = 36/15 (24) PCW = 9 Fick cardiac output/index = 5.7/2.9 PVR = FA sat = 95% PA sat = 71%, 72%  Ao Pressure: 140/77 (103) LV Pressure: 141/7/17 There was no signficant gradient across the aortic valve on pullback.  Left main: Severely calcified. 20% ostial stenosis.  LAD: Heavily calcified through proximal and midsection. 50-60% proximal stenosis. With diffuse 60-70% through midsection. Mild stenosis distally with good target. Small diagonal with heavy diffuse disease  LCX: Large vessel. Calcified. Large branching ramus with probable 80-90% ostial/proximal stenosis. OM-1 large branching vessel. Upper branch of OM-1 totally occluded. Lower branch of OM-1 with 80-90% stenosis. OM-2 severe distal disease  RCA: Totally occluded proximally. With prominent L->R colalterals  LV-gram done in the RAO projection by hand injection: Not well opacified but EF appears normal with severe LVH.   Assessment: 1. Severe 3-v calcific CAD 2. Normal LV function 3. Relatively normal R heart pressures.  Plan/Discussion Very difficult situation. He has severe CAD which will likely progress with his immunosuppressive regimen. However, LAD disease not critical. Thus unlikely to make CABG worth the risk at this point. Will treat with aggressive medical therapy at this point with asa and statin (LDL <70). Needs to stop smoking! Reviewed with Dr. Burt Knack.   Laboratory Data:  Chemistry Recent Labs  Lab 11/27/17 1652 11/28/17 0220  NA 137 138  K 4.8 4.1  CL 105 106  CO2 19* 21*  GLUCOSE 144* 168*  BUN 25* 19  CREATININE 1.39* 1.39*  CALCIUM 9.6 9.3  GFRNONAA 55* 55*  GFRAA >60 >60  ANIONGAP 13 11    Total Protein  Date Value Ref  Range Status  11/28/2017 6.1 (L) 6.5 - 8.1 g/dL Final   Albumin  Date Value Ref Range  Status  11/28/2017 3.3 (L) 3.5 - 5.0 g/dL Final   AST  Date Value Ref Range Status  11/28/2017 25 15 - 41 U/L Final   ALT  Date Value Ref Range Status  11/28/2017 17 17 - 63 U/L Final   Alkaline Phosphatase  Date Value Ref Range Status  11/28/2017 51 38 - 126 U/L Final   Total Bilirubin  Date Value Ref Range Status  11/28/2017 0.8 0.3 - 1.2 mg/dL Final   Hematology Recent Labs  Lab 11/27/17 1652 11/28/17 0220  WBC 6.8 5.7  RBC 4.66 4.52  HGB 14.1 13.8  HCT 41.7 40.3  MCV 89.5 89.2  MCH 30.3 30.5  MCHC 33.8 34.2  RDW 14.5 14.5  PLT 180 172   Cardiac Enzymes Recent Labs  Lab 11/28/17 0219 11/28/17 0942  TROPONINI 0.06* 0.06*    Recent Labs  Lab 11/27/17 1731 11/27/17 2144  TROPIPOC 0.04 0.05    BNPNo results for input(s): BNP, PROBNP in the last 168 hours.  DDimer No results for input(s): DDIMER in the last 168 hours. TSH: No results found for: TSH Lipids: Lab Results  Component Value Date   CHOL (H) 02/18/2009    205        ATP III CLASSIFICATION:  <200     mg/dL   Desirable  200-239  mg/dL   Borderline High  >=240    mg/dL   High          HDL 33 (L) 02/18/2009   LDLCALC  02/18/2009    UNABLE TO CALCULATE IF TRIGLYCERIDE OVER 400 mg/dL        Total Cholesterol/HDL:CHD Risk Coronary Heart Disease Risk Table                     Men   Women  1/2 Average Risk   3.4   3.3  Average Risk       5.0   4.4  2 X Average Risk   9.6   7.1  3 X Average Risk  23.4   11.0        Use the calculated Patient Ratio above and the CHD Risk Table to determine the patient's CHD Risk.        ATP III CLASSIFICATION (LDL):  <100     mg/dL   Optimal  100-129  mg/dL   Near or Above                    Optimal  130-159  mg/dL   Borderline  160-189  mg/dL   High  >190     mg/dL   Very High   TRIG 456 (H) 02/18/2009   CHOLHDL 6.2 02/18/2009   HgbA1c: Lab Results  Component Value Date   HGBA1C  02/18/2009    5.5 (NOTE) The ADA recommends the following  therapeutic goal for glycemic control related to Hgb A1c measurement: Goal of therapy: <6.5 Hgb A1c  Reference: American Diabetes Association: Clinical Practice Recommendations 2010, Diabetes Care, 2010, 33: (Suppl  1).    Radiology/Studies:  Dg Chest 2 View  Result Date: 11/27/2017 CLINICAL DATA:  Mid back pain radiating to the central chest for the past hour. Smoker. EXAM: CHEST  2 VIEW COMPARISON:  08/26/2016. FINDINGS: Normal sized heart. Tortuous aorta. Clear lungs. The lungs are mildly hyperexpanded. Minimal peribronchial thickening. Unremarkable bones. Atheromatous abdominal aortic  calcifications. IMPRESSION: No acute abnormality. Minimal changes of COPD and chronic bronchitis. Electronically Signed   By: Claudie Revering M.D.   On: 11/27/2017 18:18    Assessment and Plan:   1.  Chest pain with known three-vessel disease per cath 2013: -Troponin level, 0.06, 0.06 -EKG with Q waves in leads II, III,  aVF and new T wave inversions in the V4-V6, changed from 08/2016 tracing -Echo with decreased EF to 30-35% and new wall motion abnormalities consistent with RCA and/or LAD disease -Denies chest pain today -ASA, beta-blocker, statin -Nitro paste in place -Ranexa 500 mg PO BID -Will discuss options with MD based on echo, 2013 cath films and pt inclination to pursue further evaluation   2.  Hypertension: -Elevated, 135/80, 164/83, 125/96 -Amlodipine 10 mg PO QD, carvedilol 25 mg PO BID  3.  End-stage renal disease status post renal transplant 2011: -Continue immunosuppressive agents -Per IM  4.  Tobacco use: -Smoking cessation strongly encouraged  5.  DM 2: -Actos and SSI while hospitalized -Glucose 168 on admission   For questions or updates, please contact [place] Please consult [email or web address] for contact info under Cardiology/STEMI.   SignedKathyrn Drown NP-C [facility] Pager: [phone] 11/28/2017 11:08 AM

## 2017-11-28 NOTE — Progress Notes (Signed)
  Echocardiogram 2D Echocardiogram with definity has been performed.  Darlina Sicilian M 11/28/2017, 8:30 AM

## 2017-11-28 NOTE — H&P (View-Only) (Signed)
Cardiology Consultation:    Patient ID: Theodore Arellano; [id number]; 1960-10-27   Admit date: 11/27/2017 Date of Consult: 11/28/2017  Primary Care Provider: [name], MD Primary Cardiologist: Dr. Salley Scarlet- [place]  Patient Profile:   Theodore Arellano is a 57 y.o. male with a hx of tobacco use, hypertension, hyperlipidemia, DM 2, end-stage renal disease status post renal transplant in 2011 on chronic immunosuppressive therapy, with known three-vessel CAD per cardiac cath in 2013 followed by Dr. Salley Scarlet in [place] who is being seen today for the evaluation of chest pain at the request of Dr. Maudie Mercury.  History of Present Illness:   Mr. Theodore Arellano was initially evaluated by Dr. Sung Amabile in 03/2012 and underwent a right and left heart cath for progressive dyspnea and was found to have three-vessel calcific CAD, normal LV function and relatively normal right heart pressures who was not interested in pursuing CABG surgery at that time. The decision was made to move forward with aggressive medical therapy. He is followed by Dr. Salley Scarlet in [place] for his cardiology concerns, last seen several weeks ago with no reported problems.  He has been treated with Ranexa 500 mg twice daily, continues to smoke about half a pack of cigarettes per day, and is fairly compliant with his medications.   He was again seen back in the hospital four years later on 08/27/2016 with complaints of chest pain. A cardiac cath was once again offered, however, he was not interested given the risk of contrast-induced nephropathy and his history of end-stage renal disease status post  kidney transplant in 2011. He was to follow with his regular cardiologist for follow-up 2D echo and continue with aggressive medical therapy with ASA, statin and smoking cessation.   On 11/27/2017 [name] presented to the emergency department with complaints of chest and back pain which have intermittently been off and on for the last year and a  half.  He states that the pain typically begins in his mid back area and travels to his anterior chest and down his left arm with associated left hand tingling sensations. Yesterday he states that the pain began while driving his car and lasted for approximately 30 minutes in duration.  He called EMS given that the pain lasted longer than his previous episodes. He  reports that the pain, at its worst, is a 10/10 and typically resides to a 2/10 with movement or position change, however there is no exacerbation with walking or exertion. History of somewhat hard to follow and he reports that it is difficult for him to determine if his pain is arthritic in nature or actual chest pain from his known disease. He states that his symptoms are very similar to his last presentation in 2017.  He denies taking his home nitroglycerin for relief.  No reports of shortness of breath, diaphoresis, nausea and vomiting, orthopnea, lower extremity swelling.    In the emergency department, his initial troponin level is 0.05, however increased to 0.06, 0.06. His creatinine is 1.39.  An EKG was performed which showed normal sinus rhythm with a heart rate of 72 (new) Q waves in leads II, III, aVF, and T wave inversions in leads V1 through V4.  A chest x-ray was completed which showed no acute cardiopulmonary disease, with minimal changes of COPD and chronic bronchitis.  Echo 11/28/17 with decreased systolic function, EF 17% to 40% and extensive new wall motion abnormalities; dyskinesis of the apical myocardium, akinesis and scarring of the mid-apical inferior and  inferoseptal myocardium; consistent with ischemia in the distribution of the right coronary artery or a large &quot;wrap-around&quot; LAD artery. Severe hypokinesis of the apicalanteroseptal and anterior myocardium; consistent with ischemia in the distribution of the left anterior descending coronary artery and a substantial reduction in left ventricular systolic function.     Past Medical History:  Diagnosis Date  . CAD (coronary artery disease)   . Hyperlipidemia   . Hypertension   . Kidney disease    s/p kidney transplant 2011    Past Surgical History:  Procedure Laterality Date  . HD access procedures    . KIDNEY TRANSPLANT  2011  . LEFT HEART CATHETERIZATION WITH CORONARY ANGIOGRAM  03/11/2012   Procedure: LEFT HEART CATHETERIZATION WITH CORONARY ANGIOGRAM;  Surgeon: Jolaine Artist, MD;  Location: [facility] CATH LAB;  Service: Cardiovascular;;  . RIGHT HEART CATHETERIZATION N/A 03/11/2012   Procedure: RIGHT HEART CATH;  Surgeon: Jolaine Artist, MD;  Location: [facility] CATH LAB;  Service: Cardiovascular;  Laterality: N/A;     Prior to Admission medications   Medication Sig Start Date End Date Taking? Authorizing Provider  amLODipine (NORVASC) 10 MG tablet Take 10 mg by mouth daily.    Yes [provider]  aspirin EC 81 MG tablet Take 81 mg by mouth daily.   Yes [provider]  atorvastatin (LIPITOR) 20 MG tablet Take 1 tablet (20 mg total) by mouth every Monday, Wednesday, and Friday at 8 PM. 08/27/16  Yes Vann, Jessica U, DO  budesonide (PULMICORT) 180 MCG/ACT inhaler Inhale 2 puffs into the lungs 2 (two) times daily as needed (wheezing).    Yes [provider]  carvedilol (COREG) 25 MG tablet  10/14/16  Yes [provider]  colchicine 0.6 MG tablet Take 0.6 mg by mouth daily as needed (for gout).    Yes [provider]  lisinopril (PRINIVIL,ZESTRIL) 10 MG tablet Take 1 tablet (10 mg total) by mouth daily. 08/28/16  Yes Vann, Jessica U, DO  magnesium oxide (MAG-OX) 400 MG tablet Take 400 mg by mouth 2 (two) times daily.    Yes [provider]  mycophenolate (MYFORTIC) 360 MG TBEC Take 720 mg by mouth 2 (two) times daily.   Yes [provider]  NITROSTAT 0.4 MG SL tablet Place 0.4 mg under the tongue every 5 (five) minutes as needed for chest pain.  07/27/14  Yes [provider]   omeprazole (PRILOSEC) 20 MG capsule Take 20 mg by mouth daily.   Yes [provider]  predniSONE (DELTASONE) 5 MG tablet Take 5 mg by mouth every morning.    Yes [provider]  ranolazine (RANEXA) 500 MG 12 hr tablet Take 500 mg by mouth 2 (two) times daily.   Yes [provider]  sodium bicarbonate 650 MG tablet Take 1,300 mg by mouth 2 (two) times daily.   Yes [provider]  tacrolimus (PROGRAF) 1 MG capsule Take 2-3 mg by mouth 2 (two) times daily. Take 3 mg every AM and 2 mg every PM   Yes [provider]  traMADol (ULTRAM) 50 MG tablet Take 50-100 mg by mouth every 4 (four) hours as needed for pain. 05/29/16  Yes [provider]  triamcinolone cream (KENALOG) 0.1 % Apply 1 application topically as needed (for skin).  05/12/14  Yes [provider]  pioglitazone (ACTOS) 15 MG tablet Take 15 mg by mouth daily as needed (for sugar).     [provider]    Inpatient Medications: Scheduled  Meds: . amLODipine  10 mg Oral Daily  . aspirin EC  81 mg Oral Daily  . budesonide  0.5 mg Inhalation BID  . carvedilol  25 mg Oral BID WC  . enoxaparin (LOVENOX) injection  40 mg Subcutaneous Q24H  . insulin aspart  0-9 Units Subcutaneous Q4H  . lisinopril  10 mg Oral Daily  . magnesium oxide  400 mg Oral BID  . mycophenolate  720 mg Oral BID  . nitroGLYCERIN  1 inch Topical Q8H  . pantoprazole  40 mg Oral Daily  . pioglitazone  15 mg Oral Daily  . predniSONE  5 mg Oral q morning - 10a  . ranolazine  500 mg Oral BID  . rosuvastatin  20 mg Oral q1800  . sodium bicarbonate  1,300 mg Oral BID  . tacrolimus  2 mg Oral QHS  . tacrolimus  3 mg Oral Daily   Continuous Infusions: . sodium chloride 50 mL/hr at 11/28/17 0232   PRN Meds: acetaminophen **OR** acetaminophen, ALPRAZolam, colchicine  Allergies:    Allergies  Allergen Reactions  . Percocet [Oxycodone-Acetaminophen] Hives and Itching  . Vicodin  [Hydrocodone-Acetaminophen] Hives and Itching  . Shellfish Allergy Hives  . Tomato Hives    Social History:   Social History   Socioeconomic History  . Marital status: Single    Spouse name: Not on file  . Number of children: Not on file  . Years of education: Not on file  . Highest education level: Not on file  Social Needs  . Financial resource strain: Not on file  . Food insecurity - worry: Not on file  . Food insecurity - inability: Not on file  . Transportation needs - medical: Not on file  . Transportation needs - non-medical: Not on file  Occupational History  . Occupation: Unemployed Administrator  Tobacco Use  . Smoking status: Current Every Day Smoker    Packs/day: 0.50    Types: Cigarettes  . Smokeless tobacco: Never Used  Substance and Sexual Activity  . Alcohol use: Yes  . Drug use: No  . Sexual activity: Yes    Birth control/protection: None  Other Topics Concern  . Not on file  Social History Narrative  . Not on file    Family History:   Family History  Problem Relation Age of Onset  . Hypertension Father   . Diabetes Mother    Family Status:  Family Status  Relation Name Status  . Father  Deceased  . Mother  Alive    ROS:  Please see the history of present illness.  All other ROS reviewed and negative.     Physical Exam/Data:   Vitals:   11/28/17 0745 11/28/17 0900 11/28/17 0915 11/28/17 0930  BP: (!) 177/95 (!) 166/104 (!) 177/101 (!) 154/84  Pulse: (!) 57 (!) 58 (!) 55 66  Resp: 15 12 19 19   Temp:      TempSrc:      SpO2: 98% 100% 96% 98%  Weight:      Height:       No intake or output data in the 24 hours ending 11/28/17 1108 Filed Weights   11/27/17 1644  Weight: 198 lb (89.8 kg)   Body mass index is 31.01 kg/m.   General: Well developed, well nourished, NAD Skin: Warm, dry, intact  Head: Normocephalic, atraumatic, clear, moist mucus membranes. Neck: Negative for carotid bruits. No JVD Lungs:Clear to ausculation  bilaterally. No wheezes, rales, or rhonchi. Breathing is unlabored. Cardiovascular: RRR  with S1 S2. No murmurs, rubs, or gallops Abdomen: Soft, non-tender, non-distended with normoactive bowel sounds. No obvious abdominal masses. MSK: Strength and tone appear normal for age. 5/5 in all extremities Extremities: No edema. No clubbing or cyanosis. DP/PT pulses 2+ bilaterally Neuro: Alert and oriented. No focal deficits. No facial asymmetry. MAE spontaneously. Psych: Responds to questions appropriately with normal affect.     EKG:  The EKG was personally reviewed and demonstrates: 11/28/17 NSR HR 72 with Q waves in II, III, avF and T wave inversions in V4-V6 Telemetry:  Telemetry was personally reviewed and demonstrates: 11/28/17 NSR HR 71 with T wave inversion  Relevant CV Studies:  ECHO: 11/28/17 Study Conclusions  - Left ventricle: The cavity size was normal. There was mild   concentric hypertrophy. Systolic function was moderately reduced.   The estimated ejection fraction was in the range of 35% to 40%.   Dyskinesis of the apical myocardium. Akinesis and scarring of the   mid-apical inferior and inferoseptal myocardium; consistent with   ischemia in the distribution of the right coronary artery or a   large &quot;wrap-around&quot; LAD artery. Severe hypokinesis of the   apicalanteroseptal and anterior myocardium; consistent with   ischemia in the distribution of the left anterior descending   coronary artery. Doppler parameters are consistent with abnormal   left ventricular relaxation (grade 1 diastolic dysfunction).   Acoustic contrast opacification revealed no evidence ofthrombus. - Aortic valve: There was mild regurgitation. - Mitral valve: There was mild regurgitation.  Impressions:  - Compared to November 2017, there are extensive new wall motion   abnormalities and a substantial reduction in left ventricular   systolic function.   CATH:  03/11/12 Procedures  performed: 1) Right heart cathererization 2) Selective coronary angiography 3) Left heart catheterization 4) Left ventriculogram  Findings:  RA = 6 RV = 36/4/8 PA = 36/15 (24) PCW = 9 Fick cardiac output/index = 5.7/2.9 PVR = FA sat = 95% PA sat = 71%, 72%  Ao Pressure: 140/77 (103) LV Pressure: 141/7/17 There was no signficant gradient across the aortic valve on pullback.  Left main: Severely calcified. 20% ostial stenosis.  LAD: Heavily calcified through proximal and midsection. 50-60% proximal stenosis. With diffuse 60-70% through midsection. Mild stenosis distally with good target. Small diagonal with heavy diffuse disease  LCX: Large vessel. Calcified. Large branching ramus with probable 80-90% ostial/proximal stenosis. OM-1 large branching vessel. Upper branch of OM-1 totally occluded. Lower branch of OM-1 with 80-90% stenosis. OM-2 severe distal disease  RCA: Totally occluded proximally. With prominent L->R colalterals  LV-gram done in the RAO projection by hand injection: Not well opacified but EF appears normal with severe LVH.   Assessment: 1. Severe 3-v calcific CAD 2. Normal LV function 3. Relatively normal R heart pressures.  Plan/Discussion Very difficult situation. He has severe CAD which will likely progress with his immunosuppressive regimen. However, LAD disease not critical. Thus unlikely to make CABG worth the risk at this point. Will treat with aggressive medical therapy at this point with asa and statin (LDL <70). Needs to stop smoking! Reviewed with Dr. Burt Knack.   Laboratory Data:  Chemistry Recent Labs  Lab 11/27/17 1652 11/28/17 0220  NA 137 138  K 4.8 4.1  CL 105 106  CO2 19* 21*  GLUCOSE 144* 168*  BUN 25* 19  CREATININE 1.39* 1.39*  CALCIUM 9.6 9.3  GFRNONAA 55* 55*  GFRAA >60 >60  ANIONGAP 13 11    Total Protein  Date Value Ref  Range Status  11/28/2017 6.1 (L) 6.5 - 8.1 g/dL Final   Albumin  Date Value Ref Range  Status  11/28/2017 3.3 (L) 3.5 - 5.0 g/dL Final   AST  Date Value Ref Range Status  11/28/2017 25 15 - 41 U/L Final   ALT  Date Value Ref Range Status  11/28/2017 17 17 - 63 U/L Final   Alkaline Phosphatase  Date Value Ref Range Status  11/28/2017 51 38 - 126 U/L Final   Total Bilirubin  Date Value Ref Range Status  11/28/2017 0.8 0.3 - 1.2 mg/dL Final   Hematology Recent Labs  Lab 11/27/17 1652 11/28/17 0220  WBC 6.8 5.7  RBC 4.66 4.52  HGB 14.1 13.8  HCT 41.7 40.3  MCV 89.5 89.2  MCH 30.3 30.5  MCHC 33.8 34.2  RDW 14.5 14.5  PLT 180 172   Cardiac Enzymes Recent Labs  Lab 11/28/17 0219 11/28/17 0942  TROPONINI 0.06* 0.06*    Recent Labs  Lab 11/27/17 1731 11/27/17 2144  TROPIPOC 0.04 0.05    BNPNo results for input(s): BNP, PROBNP in the last 168 hours.  DDimer No results for input(s): DDIMER in the last 168 hours. TSH: No results found for: TSH Lipids: Lab Results  Component Value Date   CHOL (H) 02/18/2009    205        ATP III CLASSIFICATION:  <200     mg/dL   Desirable  200-239  mg/dL   Borderline High  >=240    mg/dL   High          HDL 33 (L) 02/18/2009   LDLCALC  02/18/2009    UNABLE TO CALCULATE IF TRIGLYCERIDE OVER 400 mg/dL        Total Cholesterol/HDL:CHD Risk Coronary Heart Disease Risk Table                     Men   Women  1/2 Average Risk   3.4   3.3  Average Risk       5.0   4.4  2 X Average Risk   9.6   7.1  3 X Average Risk  23.4   11.0        Use the calculated Patient Ratio above and the CHD Risk Table to determine the patient's CHD Risk.        ATP III CLASSIFICATION (LDL):  <100     mg/dL   Optimal  100-129  mg/dL   Near or Above                    Optimal  130-159  mg/dL   Borderline  160-189  mg/dL   High  >190     mg/dL   Very High   TRIG 456 (H) 02/18/2009   CHOLHDL 6.2 02/18/2009   HgbA1c: Lab Results  Component Value Date   HGBA1C  02/18/2009    5.5 (NOTE) The ADA recommends the following  therapeutic goal for glycemic control related to Hgb A1c measurement: Goal of therapy: <6.5 Hgb A1c  Reference: American Diabetes Association: Clinical Practice Recommendations 2010, Diabetes Care, 2010, 33: (Suppl  1).    Radiology/Studies:  Dg Chest 2 View  Result Date: 11/27/2017 CLINICAL DATA:  Mid back pain radiating to the central chest for the past hour. Smoker. EXAM: CHEST  2 VIEW COMPARISON:  08/26/2016. FINDINGS: Normal sized heart. Tortuous aorta. Clear lungs. The lungs are mildly hyperexpanded. Minimal peribronchial thickening. Unremarkable bones. Atheromatous abdominal aortic  calcifications. IMPRESSION: No acute abnormality. Minimal changes of COPD and chronic bronchitis. Electronically Signed   By: Claudie Revering M.D.   On: 11/27/2017 18:18    Assessment and Plan:   1.  Chest pain with known three-vessel disease per cath 2013: -Troponin level, 0.06, 0.06 -EKG with Q waves in leads II, III,  aVF and new T wave inversions in the V4-V6, changed from 08/2016 tracing -Echo with decreased EF to 30-35% and new wall motion abnormalities consistent with RCA and/or LAD disease -Denies chest pain today -ASA, beta-blocker, statin -Nitro paste in place -Ranexa 500 mg PO BID -Will discuss options with MD based on echo, 2013 cath films and pt inclination to pursue further evaluation   2.  Hypertension: -Elevated, 135/80, 164/83, 125/96 -Amlodipine 10 mg PO QD, carvedilol 25 mg PO BID  3.  End-stage renal disease status post renal transplant 2011: -Continue immunosuppressive agents -Per IM  4.  Tobacco use: -Smoking cessation strongly encouraged  5.  DM 2: -Actos and SSI while hospitalized -Glucose 168 on admission   For questions or updates, please contact [place] Please consult [email or web address] for contact info under Cardiology/STEMI.   SignedKathyrn Drown NP-C [facility] Pager: [phone] 11/28/2017 11:08 AM

## 2017-11-29 ENCOUNTER — Encounter (HOSPITAL_COMMUNITY)
Admission: EM | Disposition: A | Discharge status: Home / Self Care | Payer: Self-pay | Source: Home / Self Care | Attending: Internal Medicine

## 2017-11-29 ENCOUNTER — Encounter (HOSPITAL_COMMUNITY): Payer: Self-pay | Admitting: Cardiology

## 2017-11-29 DIAGNOSIS — I2583 Coronary atherosclerosis due to lipid rich plaque: Secondary | ICD-10-CM | POA: Diagnosis not present

## 2017-11-29 DIAGNOSIS — I2510 Atherosclerotic heart disease of native coronary artery without angina pectoris: Secondary | ICD-10-CM

## 2017-11-29 DIAGNOSIS — E785 Hyperlipidemia, unspecified: Secondary | ICD-10-CM | POA: Diagnosis present

## 2017-11-29 DIAGNOSIS — I214 Non-ST elevation (NSTEMI) myocardial infarction: Secondary | ICD-10-CM | POA: Diagnosis not present

## 2017-11-29 DIAGNOSIS — I10 Essential (primary) hypertension: Secondary | ICD-10-CM | POA: Diagnosis present

## 2017-11-29 DIAGNOSIS — Z940 Kidney transplant status: Secondary | ICD-10-CM | POA: Diagnosis not present

## 2017-11-29 DIAGNOSIS — G8929 Other chronic pain: Secondary | ICD-10-CM | POA: Diagnosis present

## 2017-11-29 DIAGNOSIS — M25512 Pain in left shoulder: Secondary | ICD-10-CM | POA: Diagnosis present

## 2017-11-29 DIAGNOSIS — M25511 Pain in right shoulder: Secondary | ICD-10-CM | POA: Diagnosis present

## 2017-11-29 DIAGNOSIS — I25110 Atherosclerotic heart disease of native coronary artery with unstable angina pectoris: Principal | ICD-10-CM

## 2017-11-29 DIAGNOSIS — Z833 Family history of diabetes mellitus: Secondary | ICD-10-CM | POA: Diagnosis not present

## 2017-11-29 DIAGNOSIS — F419 Anxiety disorder, unspecified: Secondary | ICD-10-CM | POA: Diagnosis present

## 2017-11-29 DIAGNOSIS — M545 Low back pain: Secondary | ICD-10-CM | POA: Diagnosis present

## 2017-11-29 DIAGNOSIS — R748 Abnormal levels of other serum enzymes: Secondary | ICD-10-CM | POA: Diagnosis not present

## 2017-11-29 DIAGNOSIS — Z7982 Long term (current) use of aspirin: Secondary | ICD-10-CM | POA: Diagnosis not present

## 2017-11-29 DIAGNOSIS — E1151 Type 2 diabetes mellitus with diabetic peripheral angiopathy without gangrene: Secondary | ICD-10-CM | POA: Diagnosis present

## 2017-11-29 DIAGNOSIS — F17210 Nicotine dependence, cigarettes, uncomplicated: Secondary | ICD-10-CM | POA: Diagnosis present

## 2017-11-29 DIAGNOSIS — Z91013 Allergy to seafood: Secondary | ICD-10-CM | POA: Diagnosis not present

## 2017-11-29 DIAGNOSIS — I200 Unstable angina: Secondary | ICD-10-CM | POA: Diagnosis not present

## 2017-11-29 DIAGNOSIS — I25118 Atherosclerotic heart disease of native coronary artery with other forms of angina pectoris: Secondary | ICD-10-CM | POA: Diagnosis not present

## 2017-11-29 DIAGNOSIS — Z7952 Long term (current) use of systemic steroids: Secondary | ICD-10-CM | POA: Diagnosis not present

## 2017-11-29 DIAGNOSIS — I209 Angina pectoris, unspecified: Secondary | ICD-10-CM | POA: Diagnosis not present

## 2017-11-29 DIAGNOSIS — Z79899 Other long term (current) drug therapy: Secondary | ICD-10-CM | POA: Diagnosis not present

## 2017-11-29 DIAGNOSIS — Z8249 Family history of ischemic heart disease and other diseases of the circulatory system: Secondary | ICD-10-CM | POA: Diagnosis not present

## 2017-11-29 DIAGNOSIS — R079 Chest pain, unspecified: Secondary | ICD-10-CM | POA: Diagnosis not present

## 2017-11-29 HISTORY — PX: LEFT HEART CATH AND CORONARY ANGIOGRAPHY: CATH118249

## 2017-11-29 LAB — CREATININE, SERUM
Creatinine, Ser: 1.24 mg/dL (ref 0.61–1.24)
GFR calc Af Amer: >60 mL/min (ref >60)
GFR calc non Af Amer: >60 mL/min (ref >60)

## 2017-11-29 LAB — CBC
HCT: 43 % (ref 39.0–52.0)
Hemoglobin: 14.2 g/dL (ref 13.0–17.0)
MCH: 29.9 pg (ref 26.0–34.0)
MCHC: 33 g/dL (ref 30.0–36.0)
MCV: 90.5 fL (ref 78.0–100.0)
Platelets: 187 10*3/uL (ref 150–400)
RBC: 4.75 MIL/uL (ref 4.22–5.81)
RDW: 14.8 % (ref 11.5–15.5)
WBC: 6.2 10*3/uL (ref 4.0–10.5)

## 2017-11-29 LAB — GLUCOSE, CAPILLARY
Glucose-Capillary: 102 mg/dL — ABNORMAL HIGH (ref 65–99)
Glucose-Capillary: 118 mg/dL — ABNORMAL HIGH (ref 65–99)
Glucose-Capillary: 119 mg/dL — ABNORMAL HIGH (ref 65–99)
Glucose-Capillary: 134 mg/dL — ABNORMAL HIGH (ref 65–99)
Glucose-Capillary: 135 mg/dL — ABNORMAL HIGH (ref 65–99)
Glucose-Capillary: 145 mg/dL — ABNORMAL HIGH (ref 65–99)
Glucose-Capillary: 151 mg/dL — ABNORMAL HIGH (ref 65–99)

## 2017-11-29 LAB — BASIC METABOLIC PANEL
Anion gap: 12 (ref 5–15)
BUN: 18 mg/dL (ref 6–20)
CO2: 21 mmol/L — ABNORMAL LOW (ref 22–32)
Calcium: 9.4 mg/dL (ref 8.9–10.3)
Chloride: 104 mmol/L (ref 101–111)
Creatinine, Ser: 1.3 mg/dL — ABNORMAL HIGH (ref 0.61–1.24)
GFR calc Af Amer: >60 mL/min (ref >60)
GFR calc non Af Amer: 59 mL/min — ABNORMAL LOW (ref >60)
Glucose, Bld: 134 mg/dL — ABNORMAL HIGH (ref 65–99)
Potassium: 4.6 mmol/L (ref 3.5–5.1)
Sodium: 137 mmol/L (ref 135–145)

## 2017-11-29 SURGERY — LEFT HEART CATH AND CORONARY ANGIOGRAPHY
Anesthesia: LOCAL

## 2017-11-29 MED ORDER — SODIUM CHLORIDE 0.9% FLUSH: 3.0000 mL | INTRAVENOUS | Status: DC | PRN

## 2017-11-29 MED ORDER — ISOSORBIDE MONONITRATE ER 30 MG PO TB24
30.0000 mg | ORAL_TABLET | Freq: Every day | ORAL | Status: DC
  Administered 2017-11-29 – 2017-12-02 (×4): 30 mg via ORAL
  Filled 2017-11-29 (×4): qty 1

## 2017-11-29 MED ORDER — HEPARIN (PORCINE) IN NACL 100-0.45 UNIT/ML-% IJ SOLN
1150.0000 [IU]/h | INTRAMUSCULAR | Status: DC
  Administered 2017-11-29: 1050 [IU]/h via INTRAVENOUS
  Administered 2017-12-01: 1150 [IU]/h via INTRAVENOUS
  Filled 2017-11-29 (×3): qty 250

## 2017-11-29 MED ORDER — SODIUM CHLORIDE 0.9 % IV SOLN: INTRAVENOUS | Status: AC

## 2017-11-29 MED ORDER — FENTANYL CITRATE (PF) 100 MCG/2ML IJ SOLN
INTRAMUSCULAR | Status: AC
  Filled 2017-11-29: qty 2

## 2017-11-29 MED ORDER — ROSUVASTATIN CALCIUM 20 MG PO TABS
40.0000 mg | ORAL_TABLET | Freq: Every day | ORAL | Status: DC
  Administered 2017-11-30 – 2017-12-01 (×2): 40 mg via ORAL
  Filled 2017-11-29 (×2): qty 2

## 2017-11-29 MED ORDER — HEPARIN (PORCINE) IN NACL 2-0.9 UNIT/ML-% IJ SOLN
INTRAMUSCULAR | Status: AC | PRN
  Administered 2017-11-29 (×2): 500 mL

## 2017-11-29 MED ORDER — MIDAZOLAM HCL 2 MG/2ML IJ SOLN
INTRAMUSCULAR | Status: DC | PRN
  Administered 2017-11-29: 1 mg via INTRAVENOUS

## 2017-11-29 MED ORDER — SODIUM CHLORIDE 0.9% FLUSH
3.0000 mL | Freq: Two times a day (BID) | INTRAVENOUS | Status: DC
  Administered 2017-11-29 – 2017-12-02 (×5): 3 mL via INTRAVENOUS

## 2017-11-29 MED ORDER — LIDOCAINE HCL (PF) 1 % IJ SOLN
INTRAMUSCULAR | Status: AC
  Filled 2017-11-29: qty 30

## 2017-11-29 MED ORDER — MIDAZOLAM HCL 2 MG/2ML IJ SOLN
INTRAMUSCULAR | Status: AC
  Filled 2017-11-29: qty 2

## 2017-11-29 MED ORDER — HEPARIN (PORCINE) IN NACL 2-0.9 UNIT/ML-% IJ SOLN
INTRAMUSCULAR | Status: AC
  Filled 2017-11-29: qty 1000

## 2017-11-29 MED ORDER — LIDOCAINE HCL (PF) 1 % IJ SOLN
INTRAMUSCULAR | Status: DC | PRN
  Administered 2017-11-29: 17 mL

## 2017-11-29 MED ORDER — IOPAMIDOL (ISOVUE-370) INJECTION 76%
INTRAVENOUS | Status: AC
  Filled 2017-11-29: qty 50

## 2017-11-29 MED ORDER — FENTANYL CITRATE (PF) 100 MCG/2ML IJ SOLN
INTRAMUSCULAR | Status: DC | PRN
  Administered 2017-11-29: 25 ug via INTRAVENOUS

## 2017-11-29 MED ORDER — SODIUM CHLORIDE 0.9 % IV SOLN: 250.0000 mL | INTRAVENOUS | Status: DC | PRN

## 2017-11-29 MED ORDER — IOPAMIDOL (ISOVUE-370) INJECTION 76%
INTRAVENOUS | Status: DC | PRN
  Administered 2017-11-29: 50 mL via INTRAVENOUS

## 2017-11-29 SURGICAL SUPPLY — 10 items
CATH INFINITI 5FR MULTPACK ANG (CATHETERS) ×1 IMPLANT
COVER PRB 48X5XTLSCP FOLD TPE (BAG) IMPLANT
COVER PROBE 5X48 (BAG) ×2
KIT HEART LEFT (KITS) ×2 IMPLANT
PACK CARDIAC CATHETERIZATION (CUSTOM PROCEDURE TRAY) ×2 IMPLANT
SHEATH PINNACLE 5F 10CM (SHEATH) ×1 IMPLANT
TRANSDUCER W/STOPCOCK (MISCELLANEOUS) ×2 IMPLANT
WIRE EMERALD 3MM-J .035X150CM (WIRE) ×1 IMPLANT
WIRE EMERALD 3MM-J .035X260CM (WIRE) ×1 IMPLANT
WIRE HI TORQ VERSACORE-J 145CM (WIRE) ×1 IMPLANT

## 2017-11-29 NOTE — Interval H&P Note (Signed)
History and Physical Interval Note:  11/29/2017 8:39 AM  Theodore Arellano  has presented today for surgery, with the diagnosis of coronary artery disease with angina pectoris.  The various methods of treatment have been discussed with the patient and family. After consideration of risks, benefits and other options for treatment, the patient has consented to  Procedure(s): LEFT HEART CATH AND CORONARY ANGIOGRAPHY (N/A) as a surgical intervention .  The patient's history has been reviewed, patient examined, no change in status, stable for surgery.  I have reviewed the patient's chart and labs.  Questions were answered to the patient's satisfaction.     Glenetta Hew

## 2017-11-29 NOTE — Progress Notes (Signed)
PROGRESS NOTE    Theodore Arellano  LFY:[id number] DOB: 08/17/1961 DOA: 11/27/2017 PCP: [name], MD   Outpatient Specialists:     Brief Narrative:  Theodore Arellano  is a 57 y.o. male, w nicotine dependence, hypertension, hyperlipidemia, dm2, esrd s/p renal transplant in 2011 on chronic immunosuppresive therapy w severe 3V calcicific CAD on cardiac catheterization in 2013 started to have back /; chest pain this afternoon about 3pm.  Pt was driving in his car when he experience chest pain,  Lasted for about 30 min.  Pt was given nitro by EMS but his pain was already fading by that time.  Pt was admitted for evaluation.     Assessment & Plan:   Active Problems:   Chest pain   Essential hypertension   Type 2 diabetes mellitus with vascular disease ([place])   Coronary artery disease due to lipid rich plaque   Elevated troponin   Coronary artery disease involving native coronary artery of native heart with unstable angina pectoris (HCC)   Chest pain -S/P Cardiac catheterization  -patient has multiple vessels disease and may require CT surgery consult -Renal transplant patient wanting to save his kidneys so minimizing procedures and dyes -Cardiology following and decision-making to follow.  HTN -Continue home meds  DM - largely controlled on current regimen. -Continue SSI -on actos- defer changes to PCP  Chronic back pain -tramadol  ESRD s/p renal transplant -renal functions relatively stable -Avoid nephrotoxic medications -continue anti-rejection meds   DVT prophylaxis:   SCD's  Code Status: Full Code   Family Communication: brother who is at bedside   Disposition Plan:  Most likely home with home health  Consultants:  Dr Fransico Him, cardiology    Subjective: No chest pain. He is status post cardiac catheterization.No shortness of breath no cough no nausea vomiting or diarrhea  Objective: Vitals:   11/29/17 0916 11/29/17 0935 11/29/17 1058  11/29/17 1500  BP: (!) 154/95 (!) 190/100 (!) 138/109 126/78  Pulse: 60 64    Resp: 15 13    Temp:    98.2 F (36.8 C)  TempSrc:    Oral  SpO2: 100% 97%  96%  Weight:      Height:        Intake/Output Summary (Last 24 hours) at 11/29/2017 1828 Last data filed at 11/29/2017 1106 Gross per 24 hour  Intake 597.08 ml  Output -  Net 597.08 ml   Filed Weights   11/27/17 1644 11/28/17 1421  Weight: 89.8 kg (198 lb) 88.9 kg (196 lb)    Examination:  General exam: Appears calm and comfortable  Respiratory system: Clear to auscultation. Respiratory effort normal. Cardiovascular system: S1 & S2 heard, RRR. No JVD, murmurs, rubs, gallops or clicks. No pedal edema. Gastrointestinal system: Abdomen is nondistended, soft and nontender. No organomegaly or masses felt. Normal bowel sounds heard. Central nervous system: Alert and oriented. No focal neurological deficits. Extremities: Symmetric 5 x 5 power. Skin: No rashes, lesions or ulcers Psychiatry: Judgement and insight appear normal. Mood & affect appropriate.     Data Reviewed: I have personally reviewed following labs and imaging studies  CBC: Recent Labs  Lab 11/27/17 1652 11/28/17 0220 11/29/17 1106  WBC 6.8 5.7 6.2  NEUTROABS 5.1  --   --   HGB 14.1 13.8 14.2  HCT 41.7 40.3 43.0  MCV 89.5 89.2 90.5  PLT 180 172 852   Basic Metabolic Panel: Recent Labs  Lab 11/27/17 1652 11/28/17 0220 11/28/17 1856 11/29/17 0415  11/29/17 1106  NA 137 138 136 137  --   K 4.8 4.1 5.1 4.6  --   CL 105 106 103 104  --   CO2 19* 21* 21* 21*  --   GLUCOSE 144* 168* 114* 134*  --   BUN 25* 19 19 18   --   CREATININE 1.39* 1.39* 1.29* 1.30* 1.24  CALCIUM 9.6 9.3 9.4 9.4  --    GFR: Estimated Creatinine Clearance: 69.9 mL/min (by C-G formula based on SCr of 1.24 mg/dL). Liver Function Tests: Recent Labs  Lab 11/27/17 1652 11/28/17 0220  AST 23 25  ALT 20 17  ALKPHOS 58 51  BILITOT 1.2 0.8  PROT 5.8* 6.1*  ALBUMIN 3.5 3.3*    Recent Labs  Lab 11/27/17 1830  LIPASE 25   No results for input(s): AMMONIA in the last 168 hours. Coagulation Profile: Recent Labs  Lab 11/28/17 1856  INR 0.98   Cardiac Enzymes: Recent Labs  Lab 11/28/17 0219 11/28/17 0942 11/28/17 1358  TROPONINI 0.06* 0.06* 0.06*   BNP (last 3 results) No results for input(s): PROBNP in the last 8760 hours. HbA1C: No results for input(s): HGBA1C in the last 72 hours. CBG: Recent Labs  Lab 11/29/17 0425 11/29/17 0734 11/29/17 0947 11/29/17 1126 11/29/17 1637  GLUCAP 135* 118* 119* 145* 151*   Lipid Profile: No results for input(s): CHOL, HDL, LDLCALC, TRIG, CHOLHDL, LDLDIRECT in the last 72 hours. Thyroid Function Tests: No results for input(s): TSH, T4TOTAL, FREET4, T3FREE, THYROIDAB in the last 72 hours. Anemia Panel: No results for input(s): VITAMINB12, FOLATE, FERRITIN, TIBC, IRON, RETICCTPCT in the last 72 hours. Urine analysis:    Component Value Date/Time   COLORURINE YELLOW 01/07/2014 0015   APPEARANCEUR CLEAR 01/07/2014 0015   LABSPEC 1.025 01/07/2014 0015   PHURINE 6.0 01/07/2014 0015   GLUCOSEU NEGATIVE 01/07/2014 0015   HGBUR NEGATIVE 01/07/2014 0015   BILIRUBINUR NEGATIVE 01/07/2014 0015   KETONESUR NEGATIVE 01/07/2014 0015   PROTEINUR NEGATIVE 01/07/2014 0015   UROBILINOGEN 1.0 01/07/2014 0015   NITRITE NEGATIVE 01/07/2014 0015   LEUKOCYTESUR NEGATIVE 01/07/2014 0015     )No results found for this or any previous visit (from the past 240 hour(s)).    Anti-infectives (From admission, onward)   None       Radiology Studies: No results found.      Scheduled Meds: . amLODipine  10 mg Oral Daily  . aspirin EC  81 mg Oral Daily  . budesonide  0.5 mg Inhalation BID  . carvedilol  25 mg Oral BID WC  . enoxaparin (LOVENOX) injection  40 mg Subcutaneous Q24H  . insulin aspart  0-9 Units Subcutaneous Q4H  . lisinopril  10 mg Oral Daily  . magnesium oxide  400 mg Oral BID  . mycophenolate   720 mg Oral BID  . nitroGLYCERIN  1 inch Topical Q8H  . pantoprazole  40 mg Oral Daily  . pioglitazone  15 mg Oral Daily  . predniSONE  5 mg Oral Q breakfast  . ranolazine  500 mg Oral BID  . rosuvastatin  20 mg Oral q1800  . sodium bicarbonate  1,300 mg Oral BID  . sodium chloride flush  3 mL Intravenous Q12H  . tacrolimus  2 mg Oral QHS  . tacrolimus  3 mg Oral Daily   Continuous Infusions: . sodium chloride       LOS: 1days    Time spent: 25 min    Nelson, DO [facility] Pager [phone]  If 7PM-7AM, please contact night-coverage [email or web address] Password [facility] 11/29/2017, 6:28 PM

## 2017-11-29 NOTE — Progress Notes (Addendum)
ANTICOAGULATION CONSULT NOTE - Initial Consult  Pharmacy Consult for heparin Indication: chest pain/ACS  Allergies  Allergen Reactions  . Percocet [Oxycodone-Acetaminophen] Hives and Itching  . Vicodin [Hydrocodone-Acetaminophen] Hives and Itching  . Shellfish Allergy Hives  . Tomato Hives    Patient Measurements: Height: 5\' 7"  (170.2 cm) Weight: 196 lb (88.9 kg) IBW/kg (Calculated) : 66.1 Heparin Dosing Weight: 85  Vital Signs: Temp: 98.2 F (36.8 C) (03/01 1500) Temp Source: Oral (03/01 1500) BP: 126/78 (03/01 1500) Pulse Rate: 64 (03/01 0935)  Labs: Recent Labs    11/27/17 1652 11/28/17 0219 11/28/17 0220 11/28/17 0942 11/28/17 1358 11/28/17 1856 11/29/17 0415 11/29/17 1106  HGB 14.1  --  13.8  --   --   --   --  14.2  HCT 41.7  --  40.3  --   --   --   --  43.0  PLT 180  --  172  --   --   --   --  187  LABPROT  --   --   --   --   --  12.9  --   --   INR  --   --   --   --   --  0.98  --   --   CREATININE 1.39*  --  1.39*  --   --  1.29* 1.30* 1.24  TROPONINI  --  0.06*  --  0.06* 0.06*  --   --   --     Estimated Creatinine Clearance: 69.9 mL/min (by C-G formula based on SCr of 1.24 mg/dL).   Medical History: Past Medical History:  Diagnosis Date  . Anxiety   . Arthritis    "lower back" (11/28/2017)  . CAD (coronary artery disease)   . Chronic lower back pain   . H/O immunosuppressive therapy    chronic/notes 11/28/2017  . History of gout    "before kidney transport" (11/28/2017)  . Hyperlipidemia   . Hypertension   . Kidney disease    s/p kidney transplant 2011; "not on dialysis now" (11/28/2017)   Assessment: 69 YOM with hx of hx of CAD, HTN, HLD, DM 2, ESRD s/p kidney transplant admitted to day with chest pain. 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 Pharmacy is consulted to start heparin with no bolus 6 hours after sheath removal (~ 0930). Pt was not on anticoagulation PTA, baseline CBC wnl.  Spoke with RN, no  bleeding or hematoma at groin site  Goal of Therapy:  Heparin level 0.3-0.7 units/ml Monitor platelets by anticoagulation protocol: Yes   Plan:  Start IV heparin 1050 units/hr with no bolus F/u heparin level at 0200 Daily heparin level and CBC F/u plans for surgery  Maryanna Shape, PharmD, BCPS  Clinical Pharmacist  Pager: [phone]   11/29/2017,7:01 PM

## 2017-11-29 NOTE — Progress Notes (Signed)
Site area: LFV Site Prior to Removal:  Level 0 Pressure Applied For:25 min Manual: yes   Patient Status During Pull:  stable Post Pull Site:  Level 0 Post Pull Instructions Given:yes   Post Pull Pulses Present: palpable Dressing Applied:  tegaderm Bedrest begins @ 8677 till 1415 Comments:

## 2017-11-29 NOTE — Progress Notes (Addendum)
Cath results discussed with Dr. Ellyn Hack.  Patient has severe 3 vessel ASCAD with extensive calcifications.  Felt to not be a candidate for PCI due to extent of calcification and renal tx (high risk of need for HD given amount of contrast needed for PCI).  Will get CVTS consult but distal targets are questionable.  Will require discussion between CVTS and interventionalist. COntinue ASA, BB, Renexa.  Increase Crestor to 40mg  daily.  Restart IV Heparin gtt in 6 hours with no bolus. D/C Nitropaste and start Imdur 30mg  daily.

## 2017-11-30 DIAGNOSIS — I25110 Atherosclerotic heart disease of native coronary artery with unstable angina pectoris: Secondary | ICD-10-CM

## 2017-11-30 DIAGNOSIS — I214 Non-ST elevation (NSTEMI) myocardial infarction: Secondary | ICD-10-CM

## 2017-11-30 LAB — CBC
HCT: 37.5 % — ABNORMAL LOW (ref 39.0–52.0)
Hemoglobin: 12.8 g/dL — ABNORMAL LOW (ref 13.0–17.0)
MCH: 30.5 pg (ref 26.0–34.0)
MCHC: 34.1 g/dL (ref 30.0–36.0)
MCV: 89.3 fL (ref 78.0–100.0)
Platelets: 190 10*3/uL (ref 150–400)
RBC: 4.2 MIL/uL — ABNORMAL LOW (ref 4.22–5.81)
RDW: 14.4 % (ref 11.5–15.5)
WBC: 5.7 10*3/uL (ref 4.0–10.5)

## 2017-11-30 LAB — GLUCOSE, CAPILLARY
Glucose-Capillary: 100 mg/dL — ABNORMAL HIGH (ref 65–99)
Glucose-Capillary: 102 mg/dL — ABNORMAL HIGH (ref 65–99)
Glucose-Capillary: 118 mg/dL — ABNORMAL HIGH (ref 65–99)
Glucose-Capillary: 138 mg/dL — ABNORMAL HIGH (ref 65–99)
Glucose-Capillary: 141 mg/dL — ABNORMAL HIGH (ref 65–99)
Glucose-Capillary: 174 mg/dL — ABNORMAL HIGH (ref 65–99)

## 2017-11-30 LAB — HEPARIN LEVEL (UNFRACTIONATED)
Heparin Unfractionated: 0.82 IU/mL — ABNORMAL HIGH (ref 0.30–0.70)
Heparin Unfractionated: 0.16 IU/mL — ABNORMAL LOW (ref 0.30–0.70)
Heparin Unfractionated: 0.26 IU/mL — ABNORMAL LOW (ref 0.30–0.70)

## 2017-11-30 MED ORDER — HEPARIN BOLUS VIA INFUSION
2000.0000 [IU] | Freq: Once | INTRAVENOUS | Status: AC
  Administered 2017-11-30: 2000 [IU] via INTRAVENOUS
  Filled 2017-11-30: qty 2000

## 2017-11-30 NOTE — Progress Notes (Signed)
ANTICOAGULATION CONSULT NOTE - Follow Up Consult  Pharmacy Consult for heparin Indication: chest pain/ACS  Allergies  Allergen Reactions  . Percocet [Oxycodone-Acetaminophen] Hives and Itching  . Vicodin [Hydrocodone-Acetaminophen] Hives and Itching  . Shellfish Allergy Hives  . Tomato Hives    Patient Measurements: Height: 5\' 7"  (170.2 cm) Weight: 198 lb 3.1 oz (89.9 kg) IBW/kg (Calculated) : 66.1 Heparin Dosing Weight: 84 kg  Vital Signs: Temp: 97.8 F (36.6 C) (03/02 1505) Temp Source: Oral (03/02 1505) BP: 114/77 (03/02 1505)  Labs: Recent Labs    11/28/17 0219  11/28/17 0220 11/28/17 1779 11/28/17 1358 11/28/17 1856 11/29/17 0415 11/29/17 1106 11/30/17 0121 11/30/17 0928 11/30/17 1621  HGB  --    < > 13.8  --   --   --   --  14.2 12.8*  --   --   HCT  --   --  40.3  --   --   --   --  43.0 37.5*  --   --   PLT  --   --  172  --   --   --   --  187 190  --   --   LABPROT  --   --   --   --   --  12.9  --   --   --   --   --   INR  --   --   --   --   --  0.98  --   --   --   --   --   HEPARINUNFRC  --   --   --   --   --   --   --   --  0.82* 0.16* 0.26*  CREATININE  --    < > 1.39*  --   --  1.29* 1.30* 1.24  --   --   --   TROPONINI 0.06*  --   --  0.06* 0.06*  --   --   --   --   --   --    < > = values in this interval not displayed.    Estimated Creatinine Clearance: 70.3 mL/min (by C-G formula based on SCr of 1.24 mg/dL).   Medications:  Scheduled:  . amLODipine  10 mg Oral Daily  . aspirin EC  81 mg Oral Daily  . budesonide  0.5 mg Inhalation BID  . carvedilol  25 mg Oral BID WC  . insulin aspart  0-9 Units Subcutaneous Q4H  . isosorbide mononitrate  30 mg Oral Daily  . lisinopril  10 mg Oral Daily  . magnesium oxide  400 mg Oral BID  . mycophenolate  720 mg Oral BID  . pantoprazole  40 mg Oral Daily  . pioglitazone  15 mg Oral Daily  . predniSONE  5 mg Oral Q breakfast  . ranolazine  500 mg Oral BID  . rosuvastatin  40 mg Oral q1800  .  sodium bicarbonate  1,300 mg Oral BID  . sodium chloride flush  3 mL Intravenous Q12H  . tacrolimus  2 mg Oral QHS  . tacrolimus  3 mg Oral Daily   Infusions:  . sodium chloride    . heparin 1,000 Units/hr (11/30/17 1115)    Assessment: 24 YOM with severe CAD on heparin post-cath 3/1, awaiting intervention decision from cardiology/CVTS. Pharmacy on board to dose Heparin for anticoagulation.  Heparin level this afternoon is SUBtherapeutic after a rate increase earlier today (HL 0.26 <<  0.16, goal of 0.3-0.7).   Goal of Therapy:  Heparin level 0.3-0.7 units/ml Monitor platelets by anticoagulation protocol: Yes   Plan:  Increase Heparin to 1150 units/hr (11.5 ml/hr) Will continue to monitor for any signs/symptoms of bleeding and will follow up with heparin level in 6 hours   Thank you for allowing pharmacy to be a part of this patient's care.  Alycia Rossetti, PharmD, BCPS Clinical Pharmacist Pager: [phone] If after 3:30p, please call main pharmacy at: [phone] 11/30/2017 5:07 PM

## 2017-11-30 NOTE — Progress Notes (Signed)
ANTICOAGULATION CONSULT NOTE - Follow Up Consult  Pharmacy Consult for heparin Indication: chest pain/ACS  Allergies  Allergen Reactions  . Percocet [Oxycodone-Acetaminophen] Hives and Itching  . Vicodin [Hydrocodone-Acetaminophen] Hives and Itching  . Shellfish Allergy Hives  . Tomato Hives    Patient Measurements: Height: 5\' 7"  (170.2 cm) Weight: 198 lb 3.1 oz (89.9 kg) IBW/kg (Calculated) : 66.1 Heparin Dosing Weight: 84 kg  Vital Signs: Temp: 98.3 F (36.8 C) (03/02 0500) Temp Source: Oral (03/02 0500) BP: 114/69 (03/02 0500) Pulse Rate: 67 (03/02 0500)  Labs: Recent Labs    11/28/17 0219 11/28/17 0220 11/28/17 0942 11/28/17 1358 11/28/17 1856 11/29/17 0415 11/29/17 1106 11/30/17 0121 11/30/17 0928  HGB  --  13.8  --   --   --   --  14.2 12.8*  --   HCT  --  40.3  --   --   --   --  43.0 37.5*  --   PLT  --  172  --   --   --   --  187 190  --   LABPROT  --   --   --   --  12.9  --   --   --   --   INR  --   --   --   --  0.98  --   --   --   --   HEPARINUNFRC  --   --   --   --   --   --   --  0.Theodore* 0.16*  CREATININE  --  1.39*  --   --  1.29* 1.30* 1.24  --   --   TROPONINI 0.06*  --  0.06* 0.06*  --   --   --   --   --     Estimated Creatinine Clearance: 70.3 mL/min (by C-G formula based on SCr of 1.24 mg/dL).   Medications:  Scheduled:  . amLODipine  10 mg Oral Daily  . aspirin EC  81 mg Oral Daily  . budesonide  0.5 mg Inhalation BID  . carvedilol  25 mg Oral BID WC  . heparin  2,000 Units Intravenous Once  . insulin aspart  0-9 Units Subcutaneous Q4H  . isosorbide mononitrate  30 mg Oral Daily  . lisinopril  10 mg Oral Daily  . magnesium oxide  400 mg Oral BID  . mycophenolate  720 mg Oral BID  . pantoprazole  40 mg Oral Daily  . pioglitazone  15 mg Oral Daily  . predniSONE  5 mg Oral Q breakfast  . ranolazine  500 mg Oral BID  . rosuvastatin  40 mg Oral q1800  . sodium bicarbonate  1,300 mg Oral BID  . sodium chloride flush  3 mL  Intravenous Q12H  . tacrolimus  2 mg Oral QHS  . tacrolimus  3 mg Oral Daily   Infusions:  . sodium chloride    . heparin 900 Units/hr (11/30/17 0224)    Assessment: Theodore Arellano with severe CAD on heparin post-cath 3/1, awaiting intervention decision from cardiology/CVTS. Initial heparin level was high 0.8, this morning level was 0.15 following rate decrease. Per RN no issues with infusion. HGB continues trending down to 12.8, PLT stable. No bleeding noted.  Goal of Therapy:  Heparin level 0.3-0.7 units/ml Monitor platelets by anticoagulation protocol: Yes   Plan:  Heparin bolus 2000 units x 1. Increase heparin gtt to 1000 units/hr Check 6-hour heparin level Daily heparin level and CBC, monitor  for s/sx of bleeding F/u cardiology/CVST intervention plans   Charlene Brooke, PharmD PGY1 Pharmacy Resident Phone: [phone] After 3:30PM please call [facility] [phone] 11/30/2017,11:02 AM

## 2017-11-30 NOTE — Progress Notes (Signed)
Subjective:  No c/o chest pain.  Awaiting CVTS eval.  Objective:  Vital Signs in the last 24 hours: BP 114/69 (BP Location: Left Arm)   Pulse 67   Temp 98.3 F (36.8 C) (Oral)   Resp 16   Ht 5\' 7"  (1.702 m)   Wt 89.9 kg (198 lb 3.1 oz)   SpO2 98%   BMI 31.04 kg/m   Physical Exam: Pleasant BM in NAD Lungs:  Clear Cardiac:  Regular rhythm, normal S1 and S2, no S3 Extremities:  Cath site clean and dry   Intake/Output from previous day: 03/01 0701 - 03/02 0700 In: 838.4 [P.O.:560; I.V.:278.4] Out: -   Weight Filed Weights   11/27/17 1644 11/28/17 1421 11/30/17 0500  Weight: 89.8 kg (198 lb) 88.9 kg (196 lb) 89.9 kg (198 lb 3.1 oz)    Lab Results: Basic Metabolic Panel: Recent Labs    11/28/17 1856 11/29/17 0415 11/29/17 1106  NA 136 137  --   K 5.1 4.6  --   CL 103 104  --   CO2 21* 21*  --   GLUCOSE 114* 134*  --   BUN 19 18  --   CREATININE 1.29* 1.30* 1.24   CBC: Recent Labs    11/27/17 1652  11/29/17 1106 11/30/17 0121  WBC 6.8   < > 6.2 5.7  NEUTROABS 5.1  --   --   --   HGB 14.1   < > 14.2 12.8*  HCT 41.7   < > 43.0 37.5*  MCV 89.5   < > 90.5 89.3  PLT 180   < > 187 190   < > = values in this interval not displayed.   Cardiac Enzymes: Troponin [facility] of Care Test) Recent Labs    11/27/17 2144  TROPIPOC 0.05   Cardiac Panel (last 3 results) Recent Labs    11/28/17 0219 11/28/17 0942 11/28/17 1358  TROPONINI 0.06* 0.06* 0.06*    Telemetry: Sinus rhythm  Assessment/Plan:  1. Severe calcific 3VD  Rec:  Awaiting CVTS thoughts on operation and approach.      Kerry Hough  MD [facility]  11/30/2017, 12:30 PM

## 2017-11-30 NOTE — Progress Notes (Signed)
PROGRESS NOTE    Theodore Arellano  RXV:[id number] DOB: 1961/04/15 DOA: 11/27/2017 PCP: [name], MD   Outpatient Specialists:     Brief Narrative:  Theodore Arellano  is a 57 y.o. male, w nicotine dependence, hypertension, hyperlipidemia, dm2, esrd s/p renal transplant in 2011 on chronic immunosuppresive therapy w severe 3V calcicific CAD on cardiac catheterization in 2013 started to have back /; chest pain this afternoon about 3pm.  Pt was driving in his car when he experience chest pain,  Lasted for about 30 min.  Pt was given nitro by EMS but his pain was already fading by that time.  Pt was admitted for evaluation.     Assessment & Plan:   Active Problems:   Chest pain   Essential hypertension   Type 2 diabetes mellitus with vascular disease (HCC)   Coronary artery disease due to lipid rich plaque   Elevated troponin   Coronary artery disease involving native coronary artery of native heart with unstable angina pectoris (HCC)   Chest pain -S/P Cardiac catheterization  -patient has multiple vessels disease and has had CT surgery consult -Not deemed to be a good candidate for coronary artery bypass graft -Awaiting conversation with interventional radiology and final decision by CT surgery -Renal transplant patient wanting to save his kidneys so minimizing procedures and dyes -Cardiology following and decision-making to follow.  HTN -Continue home meds  DM - largely controlled on current regimen. -Continue SSI -on actos- defer changes to PCP  Chronic back pain -tramadol  ESRD s/p renal transplant -renal functions relatively stable -Avoid nephrotoxic medications -continue anti-rejection meds   DVT prophylaxis:   SCD's  Code Status: Full Code   Family Communication: brother who is at bedside   Disposition Plan:  Most likely home with home health  Consultants:  Dr Fransico Him, cardiology    Subjective: No chest pain. Patient is currently  stable with no symptoms.  Objective: Vitals:   11/29/17 2016 11/29/17 2100 11/30/17 0500 11/30/17 1505  BP: 114/75  114/69 114/77  Pulse:   67   Resp:   16   Temp:   98.3 F (36.8 C) 97.8 F (36.6 C)  TempSrc:   Oral Oral  SpO2:  97% 98% 94%  Weight:   89.9 kg (198 lb 3.1 oz)   Height:        Intake/Output Summary (Last 24 hours) at 11/30/2017 1936 Last data filed at 11/30/2017 1500 Gross per 24 hour  Intake 561.9 ml  Output -  Net 561.9 ml   Filed Weights   11/27/17 1644 11/28/17 1421 11/30/17 0500  Weight: 89.8 kg (198 lb) 88.9 kg (196 lb) 89.9 kg (198 lb 3.1 oz)    Examination:  General exam: Appears calm and comfortable  Respiratory system: Clear to auscultation. Respiratory effort normal. Cardiovascular system: S1 & S2 heard, RRR. No JVD, murmurs, rubs, gallops or clicks. No pedal edema. Gastrointestinal system: Abdomen is nondistended, soft and nontender. No organomegaly or masses felt. Normal bowel sounds heard. Central nervous system: Alert and oriented. No focal neurological deficits. Extremities: Symmetric 5 x 5 power. Skin: No rashes, lesions or ulcers Psychiatry: Judgement and insight appear normal. Mood & affect appropriate.     Data Reviewed: I have personally reviewed following labs and imaging studies  CBC: Recent Labs  Lab 11/27/17 1652 11/28/17 0220 11/29/17 1106 11/30/17 0121  WBC 6.8 5.7 6.2 5.7  NEUTROABS 5.1  --   --   --   HGB 14.1 13.8  14.2 12.8*  HCT 41.7 40.3 43.0 37.5*  MCV 89.5 89.2 90.5 89.3  PLT 180 172 187 621   Basic Metabolic Panel: Recent Labs  Lab 11/27/17 1652 11/28/17 0220 11/28/17 1856 11/29/17 0415 11/29/17 1106  NA 137 138 136 137  --   K 4.8 4.1 5.1 4.6  --   CL 105 106 103 104  --   CO2 19* 21* 21* 21*  --   GLUCOSE 144* 168* 114* 134*  --   BUN 25* 19 19 18   --   CREATININE 1.39* 1.39* 1.29* 1.30* 1.24  CALCIUM 9.6 9.3 9.4 9.4  --    GFR: Estimated Creatinine Clearance: 70.3 mL/min (by C-G formula based  on SCr of 1.24 mg/dL). Liver Function Tests: Recent Labs  Lab 11/27/17 1652 11/28/17 0220  AST 23 25  ALT 20 17  ALKPHOS 58 51  BILITOT 1.2 0.8  PROT 5.8* 6.1*  ALBUMIN 3.5 3.3*   Recent Labs  Lab 11/27/17 1830  LIPASE 25   No results for input(s): AMMONIA in the last 168 hours. Coagulation Profile: Recent Labs  Lab 11/28/17 1856  INR 0.98   Cardiac Enzymes: Recent Labs  Lab 11/28/17 0219 11/28/17 0942 11/28/17 1358  TROPONINI 0.06* 0.06* 0.06*   BNP (last 3 results) No results for input(s): PROBNP in the last 8760 hours. HbA1C: No results for input(s): HGBA1C in the last 72 hours. CBG: Recent Labs  Lab 11/30/17 0028 11/30/17 0525 11/30/17 0729 11/30/17 1143 11/30/17 1627  GLUCAP 141* 102* 100* 118* 138*   Lipid Profile: No results for input(s): CHOL, HDL, LDLCALC, TRIG, CHOLHDL, LDLDIRECT in the last 72 hours. Thyroid Function Tests: No results for input(s): TSH, T4TOTAL, FREET4, T3FREE, THYROIDAB in the last 72 hours. Anemia Panel: No results for input(s): VITAMINB12, FOLATE, FERRITIN, TIBC, IRON, RETICCTPCT in the last 72 hours. Urine analysis:    Component Value Date/Time   COLORURINE YELLOW 01/07/2014 0015   APPEARANCEUR CLEAR 01/07/2014 0015   LABSPEC 1.025 01/07/2014 0015   PHURINE 6.0 01/07/2014 0015   GLUCOSEU NEGATIVE 01/07/2014 0015   HGBUR NEGATIVE 01/07/2014 0015   BILIRUBINUR NEGATIVE 01/07/2014 0015   KETONESUR NEGATIVE 01/07/2014 0015   PROTEINUR NEGATIVE 01/07/2014 0015   UROBILINOGEN 1.0 01/07/2014 0015   NITRITE NEGATIVE 01/07/2014 0015   LEUKOCYTESUR NEGATIVE 01/07/2014 0015     )No results found for this or any previous visit (from the past 240 hour(s)).    Anti-infectives (From admission, onward)   None       Radiology Studies: No results found.      Scheduled Meds: . amLODipine  10 mg Oral Daily  . aspirin EC  81 mg Oral Daily  . budesonide  0.5 mg Inhalation BID  . carvedilol  25 mg Oral BID WC  .  insulin aspart  0-9 Units Subcutaneous Q4H  . isosorbide mononitrate  30 mg Oral Daily  . lisinopril  10 mg Oral Daily  . magnesium oxide  400 mg Oral BID  . mycophenolate  720 mg Oral BID  . pantoprazole  40 mg Oral Daily  . pioglitazone  15 mg Oral Daily  . predniSONE  5 mg Oral Q breakfast  . ranolazine  500 mg Oral BID  . rosuvastatin  40 mg Oral q1800  . sodium bicarbonate  1,300 mg Oral BID  . sodium chloride flush  3 mL Intravenous Q12H  . tacrolimus  2 mg Oral QHS  . tacrolimus  3 mg Oral Daily   Continuous Infusions: .  sodium chloride    . heparin 1,150 Units/hr (11/30/17 1731)     LOS: 1days    Time spent: 25 min    GARBA,LAWAL, DO [facility] Pager [phone]  If 7PM-7AM, please contact night-coverage [email or web address] Password [email or web address] 11/30/2017, 7:36 PM

## 2017-11-30 NOTE — Progress Notes (Signed)
Patient without CP tonight. Will continue to monitor.

## 2017-11-30 NOTE — Progress Notes (Signed)
[facility] for heparin Indication: chest pain/ACS  Allergies  Allergen Reactions  . Percocet [Oxycodone-Acetaminophen] Hives and Itching  . Vicodin [Hydrocodone-Acetaminophen] Hives and Itching  . Shellfish Allergy Hives  . Tomato Hives    Patient Measurements: Height: 5\' 7"  (170.2 cm) Weight: 196 lb (88.9 kg) IBW/kg (Calculated) : 66.1 Heparin Dosing Weight: 85  Vital Signs: Temp: 98.2 F (36.8 C) (03/01 1500) Temp Source: Oral (03/01 1500) BP: 114/75 (03/01 2016)  Labs: Recent Labs    11/28/17 0219 11/28/17 0220 11/28/17 0942 11/28/17 1358 11/28/17 1856 11/29/17 0415 11/29/17 1106 11/30/17 0121  HGB  --  13.8  --   --   --   --  14.2 12.8*  HCT  --  40.3  --   --   --   --  43.0 37.5*  PLT  --  172  --   --   --   --  187 190  LABPROT  --   --   --   --  12.9  --   --   --   INR  --   --   --   --  0.98  --   --   --   HEPARINUNFRC  --   --   --   --   --   --   --  0.82*  CREATININE  --  1.39*  --   --  1.29* 1.30* 1.24  --   TROPONINI 0.06*  --  0.06* 0.06*  --   --   --   --     Estimated Creatinine Clearance: 69.9 mL/min (by C-G formula based on SCr of 1.24 mg/dL).   Medical History: Past Medical History:  Diagnosis Date  . Anxiety   . Arthritis    "lower back" (11/28/2017)  . CAD (coronary artery disease)   . Chronic lower back pain   . H/O immunosuppressive therapy    chronic/notes 11/28/2017  . History of gout    "before kidney transport" (11/28/2017)  . Hyperlipidemia   . Hypertension   . Kidney disease    s/p kidney transplant 2011; "not on dialysis now" (11/28/2017)   Assessment: 41 YOM with hx of hx of CAD, HTN, HLD, DM 2, ESRD s/p kidney transplant admitted to day with chest pain. 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 Pharmacy is consulted to start heparin with no bolus 6 hours after sheath removal (~ 0930). Pt was not on anticoagulation PTA, baseline CBC wnl.  Spoke with  RN, no bleeding or hematoma at groin site prior to initiation.  Initial heparin level above goal at 0.8. No issues noted. Will decrease rate. Hgb down 14.2>12.8 this am.   Goal of Therapy:  Heparin level 0.3-0.7 units/ml Monitor platelets by anticoagulation protocol: Yes   Plan:  Decrease IV heparin to 900 units/hr  F/u heparin level at 1000 Daily heparin level and CBC F/u plans for surgery  Erin Hearing PharmD., BCPS Clinical Pharmacist 11/30/2017 2:06 AM

## 2017-11-30 NOTE — Consult Note (Signed)
Reason for Consult:3 vessel CAD Referring Physician: Dr. Vaughan Sine Theodore Arellano is an 57 y.o. male.  HPI: 57 yo man with a history of CAD, hypertension, hyperlipidemia, ESRD s/p transplant, chronic immunosuppression, arthritis, chronic back pain, and ongoing tobacco abuse. He has known CAD with 3 vessel CAD by cath in 2013. He did not want CABG at that time and opted for medical treatment. He has been having episodes of CP for the past 2 years. Says it sometimes starts in back and radiates to chest and sometimes starts in the chest and radiates to back. Then radiates to left arm. Lasts up to 15 min. Tried taking NTG at times but didn't think it helped and decided it wasn't his heart.  Was admitted 2/27 after a prolonged episode lasting 30 min. Initial troponin was 0.05, peaked at 0.06.  A 2 D Echo showed an EF of 35% with apical dyskinesis, mid and apical inferior and inferoseptal akinesis and anteroseptal, anterior and apical severe hypokinesis. Yesterday had cardiac catheterization which revealed severe 3 vessel CAD with diffusely diseased target vessels.  No pain since admission.  Past Medical History:  Diagnosis Date  . Anxiety   . Arthritis    "lower back" (11/28/2017)  . CAD (coronary artery disease)   . Chronic lower back pain   . H/O immunosuppressive therapy    chronic/notes 11/28/2017  . History of gout    "before kidney transport" (11/28/2017)  . Hyperlipidemia   . Hypertension   . Kidney disease    s/p kidney transplant 2011; "not on dialysis now" (11/28/2017)    Past Surgical History:  Procedure Laterality Date  . ARTERIOVENOUS GRAFT PLACEMENT Right 04/2007   Archie Endo 02/01/2011  . AV FISTULA PLACEMENT Left 12/20/2005; 12/2006   Archie Endo 5/15/2012Marland Kitchen Archie Endo 02/13/2011  . CARDIAC CATHETERIZATION    . HD access procedures    . INGUINAL HERNIA REPAIR Left   . KIDNEY TRANSPLANT  2011  . LEFT HEART CATH AND CORONARY ANGIOGRAPHY N/A 11/29/2017   Procedure: LEFT HEART CATH AND CORONARY  ANGIOGRAPHY;  Surgeon: Leonie Man, MD;  Location: [facility];  Service: Cardiovascular;  Laterality: N/A;  . LEFT HEART CATHETERIZATION WITH CORONARY ANGIOGRAM  03/11/2012   Procedure: LEFT HEART CATHETERIZATION WITH CORONARY ANGIOGRAM;  Surgeon: Jolaine Artist, MD;  Location: [facility] CATH LAB;  Service: Cardiovascular;;  . RIGHT HEART CATHETERIZATION N/A 03/11/2012   Procedure: RIGHT HEART CATH;  Surgeon: Jolaine Artist, MD;  Location: [facility] CATH LAB;  Service: Cardiovascular;  Laterality: N/A;  . THROMBECTOMY Right 12/2007   Archie Endo 02/01/2011  . THROMBECTOMY / ARTERIOVENOUS GRAFT REVISION Left 12/2006   Archie Endo 02/13/2011  . THROMBECTOMY / ARTERIOVENOUS GRAFT REVISION Right 07/2007; 10/2007;01/2008;   Archie Endo 01/31/2011; Archie Endo 02/01/2011; Archie Endo 01/31/2011  . THROMBECTOMY AND REVISION OF ARTERIOVENTOUS (AV) GORETEX  GRAFT  03/2007 X 2   /notes 02/01/2011    Family History  Problem Relation Age of Onset  . Hypertension Father   . Diabetes Mother     Social History:  reports that he has been smoking cigarettes.  He has a 20.00 pack-year smoking history. he has never used smokeless tobacco. He reports that he drinks about 7.2 oz of alcohol per week. He reports that he does not use drugs.  Allergies:  Allergies  Allergen Reactions  . Percocet [Oxycodone-Acetaminophen] Hives and Itching  . Vicodin [Hydrocodone-Acetaminophen] Hives and Itching  . Shellfish Allergy Hives  . Tomato Hives    Medications:  Prior to Admission:  Medications  Prior to Admission  Medication Sig Dispense Refill Last Dose  . amLODipine (NORVASC) 10 MG tablet Take 10 mg by mouth daily.    11/26/2017 at Unknown time  . aspirin EC 81 MG tablet Take 81 mg by mouth daily.   11/27/2017 at Unknown time  . atorvastatin (LIPITOR) 20 MG tablet Take 1 tablet (20 mg total) by mouth every Monday, Wednesday, and Friday at 8 PM. 30 tablet 0 11/25/2017  . budesonide (PULMICORT) 180 MCG/ACT inhaler Inhale 2 puffs into the lungs 2  (two) times daily as needed (wheezing).    unk at prn  . carvedilol (COREG) 25 MG tablet    11/27/2017 at Unknown time  . colchicine 0.6 MG tablet Take 0.6 mg by mouth daily as needed (for gout).    Past Week at Unknown time  . lisinopril (PRINIVIL,ZESTRIL) 10 MG tablet Take 1 tablet (10 mg total) by mouth daily. 30 tablet 0 11/27/2017 at Unknown time  . magnesium oxide (MAG-OX) 400 MG tablet Take 400 mg by mouth 2 (two) times daily.    11/27/2017 at Unknown time  . mycophenolate (MYFORTIC) 360 MG TBEC Take 720 mg by mouth 2 (two) times daily.   11/27/2017 at Unknown time  . NITROSTAT 0.4 MG SL tablet Place 0.4 mg under the tongue every 5 (five) minutes as needed for chest pain.   1 unk at prn  . omeprazole (PRILOSEC) 20 MG capsule Take 20 mg by mouth daily.   11/27/2017 at Unknown time  . predniSONE (DELTASONE) 5 MG tablet Take 5 mg by mouth every morning.    11/27/2017 at Unknown time  . ranolazine (RANEXA) 500 MG 12 hr tablet Take 500 mg by mouth 2 (two) times daily.   11/27/2017 at Unknown time  . sodium bicarbonate 650 MG tablet Take 1,300 mg by mouth 2 (two) times daily.   11/27/2017 at Unknown time  . tacrolimus (PROGRAF) 1 MG capsule Take 2-3 mg by mouth 2 (two) times daily. Take 3 mg every AM and 2 mg every PM   11/27/2017 at Unknown time  . traMADol (ULTRAM) 50 MG tablet Take 50-100 mg by mouth every 4 (four) hours as needed for pain.   Past Week at Unknown time  . triamcinolone cream (KENALOG) 0.1 % Apply 1 application topically as needed (for skin).   0 unk at prn  . pioglitazone (ACTOS) 15 MG tablet Take 15 mg by mouth daily as needed (for sugar).    Not Taking at Unknown time    Results for orders placed or performed during the hospital encounter of 11/27/17 (from the past 48 hour(s))  Basic metabolic panel     Status: Abnormal   Collection Time: 11/28/17  6:56 PM  Result Value Ref Range   Sodium 136 135 - 145 mmol/L   Potassium 5.1 3.5 - 5.1 mmol/L    Comment: SLIGHT HEMOLYSIS    Chloride 103 101 - 111 mmol/L   CO2 21 (L) 22 - 32 mmol/L   Glucose, Bld 114 (H) 65 - 99 mg/dL   BUN 19 6 - 20 mg/dL   Creatinine, Ser 1.29 (H) 0.61 - 1.24 mg/dL   Calcium 9.4 8.9 - 10.3 mg/dL   GFR calc non Af Amer 60 (L) >60 mL/min   GFR calc Af Amer >60 >60 mL/min    Comment: (NOTE) The eGFR has been calculated using the CKD EPI equation. This calculation has not been validated in all clinical situations. eGFR's persistently <60 mL/min signify possible Chronic  Kidney Disease.    Anion gap 12 5 - 15    Comment: Performed at [facility] [address], [place], [place] [address]  Protime-INR     Status: None   Collection Time: 11/28/17  6:56 PM  Result Value Ref Range   Prothrombin Time 12.9 11.4 - 15.2 seconds   INR 0.98     Comment: Performed at [facility] [address] [place], [place] [address]  Glucose, capillary     Status: Abnormal   Collection Time: 11/28/17  9:07 PM  Result Value Ref Range   Glucose-Capillary 120 (H) 65 - 99 mg/dL  Glucose, capillary     Status: Abnormal   Collection Time: 11/29/17 12:47 AM  Result Value Ref Range   Glucose-Capillary 102 (H) 65 - 99 mg/dL  Basic metabolic panel     Status: Abnormal   Collection Time: 11/29/17  4:15 AM  Result Value Ref Range   Sodium 137 135 - 145 mmol/L   Potassium 4.6 3.5 - 5.1 mmol/L    Comment: SPECIMEN HEMOLYZED. HEMOLYSIS MAY AFFECT INTEGRITY OF RESULTS.   Chloride 104 101 - 111 mmol/L   CO2 21 (L) 22 - 32 mmol/L   Glucose, Bld 134 (H) 65 - 99 mg/dL   BUN 18 6 - 20 mg/dL   Creatinine, Ser 1.30 (H) 0.61 - 1.24 mg/dL   Calcium 9.4 8.9 - 10.3 mg/dL   GFR calc non Af Amer 59 (L) >60 mL/min   GFR calc Af Amer >60 >60 mL/min    Comment: (NOTE) The eGFR has been calculated using the CKD EPI equation. This calculation has not been validated in all clinical situations. eGFR's persistently <60 mL/min signify possible Chronic Kidney Disease.    Anion gap 12 5 - 15    Comment: Performed at  [facility] [address], [place], [place] [address]  Glucose, capillary     Status: Abnormal   Collection Time: 11/29/17  4:25 AM  Result Value Ref Range   Glucose-Capillary 135 (H) 65 - 99 mg/dL  Glucose, capillary     Status: Abnormal   Collection Time: 11/29/17  7:34 AM  Result Value Ref Range   Glucose-Capillary 118 (H) 65 - 99 mg/dL  Glucose, capillary     Status: Abnormal   Collection Time: 11/29/17  9:47 AM  Result Value Ref Range   Glucose-Capillary 119 (H) 65 - 99 mg/dL  CBC     Status: None   Collection Time: 11/29/17 11:06 AM  Result Value Ref Range   WBC 6.2 4.0 - 10.5 K/uL   RBC 4.75 4.22 - 5.81 MIL/uL   Hemoglobin 14.2 13.0 - 17.0 g/dL   HCT 43.0 39.0 - 52.0 %   MCV 90.5 78.0 - 100.0 fL   MCH 29.9 26.0 - 34.0 pg   MCHC 33.0 30.0 - 36.0 g/dL   RDW 14.8 11.5 - 15.5 %   Platelets 187 150 - 400 K/uL    Comment: Performed at [facility], [place]. [address], [place], [place] [address]  Creatinine, serum     Status: None   Collection Time: 11/29/17 11:06 AM  Result Value Ref Range   Creatinine, Ser 1.24 0.61 - 1.24 mg/dL   GFR calc non Af Amer >60 >60 mL/min   GFR calc Af Amer >60 >60 mL/min    Comment: (NOTE) The eGFR has been calculated using the CKD EPI equation. This calculation has not been validated in all  clinical situations. eGFR's persistently <60 mL/min signify possible Chronic Kidney Disease. Performed at [facility], [place] [address], [place], [place] [address]   Glucose, capillary     Status: Abnormal   Collection Time: 11/29/17 11:26 AM  Result Value Ref Range   Glucose-Capillary 145 (H) 65 - 99 mg/dL  Glucose, capillary     Status: Abnormal   Collection Time: 11/29/17  4:37 PM  Result Value Ref Range   Glucose-Capillary 151 (H) 65 - 99 mg/dL  Glucose, capillary     Status: Abnormal   Collection Time: 11/29/17  8:30 PM  Result Value Ref Range   Glucose-Capillary 134 (H) 65 - 99 mg/dL  Glucose, capillary     Status: Abnormal    Collection Time: 11/30/17 12:28 AM  Result Value Ref Range   Glucose-Capillary 141 (H) 65 - 99 mg/dL  Heparin level (unfractionated)     Status: Abnormal   Collection Time: 11/30/17  1:21 AM  Result Value Ref Range   Heparin Unfractionated 0.82 (H) 0.30 - 0.70 IU/mL    Comment:        IF HEPARIN RESULTS ARE BELOW EXPECTED VALUES, AND PATIENT DOSAGE HAS BEEN CONFIRMED, SUGGEST FOLLOW UP TESTING OF ANTITHROMBIN III LEVELS. Performed at [facility], [place] [address], [place], [place] [address]   CBC     Status: Abnormal   Collection Time: 11/30/17  1:21 AM  Result Value Ref Range   WBC 5.7 4.0 - 10.5 K/uL   RBC 4.20 (L) 4.22 - 5.81 MIL/uL   Hemoglobin 12.8 (L) 13.0 - 17.0 g/dL   HCT 37.5 (L) 39.0 - 52.0 %   MCV 89.3 78.0 - 100.0 fL   MCH 30.5 26.0 - 34.0 pg   MCHC 34.1 30.0 - 36.0 g/dL   RDW 14.4 11.5 - 15.5 %   Platelets 190 150 - 400 K/uL    Comment: Performed at [facility], [place] [address], [place], [place] [address]  Glucose, capillary     Status: Abnormal   Collection Time: 11/30/17  5:25 AM  Result Value Ref Range   Glucose-Capillary 102 (H) 65 - 99 mg/dL  Glucose, capillary     Status: Abnormal   Collection Time: 11/30/17  7:29 AM  Result Value Ref Range   Glucose-Capillary 100 (H) 65 - 99 mg/dL  Heparin level (unfractionated)     Status: Abnormal   Collection Time: 11/30/17  9:28 AM  Result Value Ref Range   Heparin Unfractionated 0.16 (L) 0.30 - 0.70 IU/mL    Comment:        IF HEPARIN RESULTS ARE BELOW EXPECTED VALUES, AND PATIENT DOSAGE HAS BEEN CONFIRMED, SUGGEST FOLLOW UP TESTING OF ANTITHROMBIN III LEVELS. Performed at [facility], [place] [address] [place], [place] [address]   Glucose, capillary     Status: Abnormal   Collection Time: 11/30/17 11:43 AM  Result Value Ref Range   Glucose-Capillary 118 (H) 65 - 99 mg/dL  Heparin level (unfractionated)     Status: Abnormal   Collection Time: 11/30/17  4:21 PM  Result Value Ref Range    Heparin Unfractionated 0.26 (L) 0.30 - 0.70 IU/mL    Comment:        IF HEPARIN RESULTS ARE BELOW EXPECTED VALUES, AND PATIENT DOSAGE HAS BEEN CONFIRMED, SUGGEST FOLLOW UP TESTING OF ANTITHROMBIN III LEVELS. Performed at [facility], [place] [address] [place], [place] [address]   Glucose, capillary     Status: Abnormal  Collection Time: 11/30/17  4:27 PM  Result Value Ref Range   Glucose-Capillary 138 (H) 65 - 99 mg/dL    No results found.  Review of Systems  Constitutional: Positive for malaise/fatigue.  Cardiovascular: Positive for chest pain.  Gastrointestinal: Negative for nausea and vomiting.  Genitourinary: Negative for dysuria.  Musculoskeletal: Positive for back pain and joint pain.  Neurological: Negative for focal weakness and seizures.   Blood pressure 114/77, pulse 67, temperature 97.8 F (36.6 C), temperature source Oral, resp. rate 16, height _0  (1.702 m), weight 198 lb 3.1 oz (89.9 kg), SpO2 94 %. Physical Exam  Vitals reviewed. Constitutional: He is oriented to person, place, and time. He appears well-developed and well-nourished. No distress.  57 yo man appears older than stated age  HENT:  Head: Normocephalic and atraumatic.  Mouth/Throat: No oropharyngeal exudate.  Eyes: Conjunctivae and EOM are normal.  Neck: No thyromegaly present.  No carotid bruits  Cardiovascular: Normal rate, regular rhythm and normal heart sounds. Exam reveals no gallop and no friction rub.  No murmur heard. Respiratory: Effort normal. No respiratory distress. He has no wheezes. He has no rales.  Faint rhonchi bilaterally  GI: Soft. He exhibits no distension. There is no tenderness.  Musculoskeletal: He exhibits no edema.  Lymphadenopathy:    He has no cervical adenopathy.  Neurological: He is alert and oriented to person, place, and time. No cranial nerve deficit. He exhibits normal muscle tone.  Skin: Skin is warm and dry.   Cardiac catheterization Conclusion      Ost LAD to Prox LAD lesion is 80% stenosed. Prox LAD lesion is 100% stenosed. -- distal LAD partially filled via L-L/Bridging collaterals: Mid LAD to Dist LAD lesion is 90% stenosed.  Bifurcating ramus intermedius: Ost Ramus lesion is 95% stenosed. Ramus lesion is 70% stenosed. Lat Ramus lesion is 70% stenosed.  Bifurcating OM1: Ost 1st Mrg lesion is 99% stenosed with 99% stenosed side branch in Lat 1st Mrg. Ost 1st Mrg to 1st Mrg lesion is 100% stenosed.  Mid Cx to Dist Cx lesion is 75% stenosed. The distal circumflex faintly collateralizes the right posterior lateral system  Previously 100% occluded. RCA - Prox RCA lesion is 100% stenosed. -Partial recanalization and bridging collaterals faintly fill the vessel  Prox RCA to Mid RCA lesion is 90% stenosed. Mid RCA to Dist RCA lesion is 70% stenosed.  Post Atrio lesion is 100% stenosed. - L-R collaterals do not fill to RPDA   Severe Multivessel CAD with extensive calcification & dramatic progression of disease from last cath.  Likely not a PCI candidate given extent of calcified disease & renal transplant (unless HD is an option).  Could consider CT Surgical Consult - but distal targets are questionable. (not well filled) There is extensive calcification throughout.  This extent of disease the demonstrates dramatic progression of disease from prior catheterization. The occluded RCA and LAD seem to have partial recanalization versus bridging collaterals and are extensively calcified.  The OM1 branch is subtotally occluded proximally before it bifurcates into 2 major branches that are severely diseased.  The ramus intermedius also severely diseased proximally and then has disease in both branches.  Distal targets are questionable for bypass graft targets.  There are not many good options for this gentleman who is hoping to avoid going back on dialysis after transplant kidney. Global team discussion will be needed to decide the best  option.  Plan: He will return to his nursing unit after sheath removal in the  PACU holding area.   I personally reviewed the cath images and concur with the findings noted above. Severe 3 vessel CAD with poor target vessels.  Assessment/Plan: Mr. Cordts is a 57 yo man with multiple cardiac risk factors and known CAD who presented with an episode of Cp radiating to the left arm. He ruled in for a non STEMI with a troponin of 0.06. At catheterization he has severe 3 vessel CAD.  His CAD is severe and diffuse. He has extremely poor target vessels. In fact, I am not sure a graft can be placed in the majority of the vessels.  Even if grafts are placed, any beneficial effect is likely to be short lived, as there is a high likelihood of early graft thrombosis due to outflow issues.  Will d/w interventional cardiology.  Melrose Nakayama 11/30/2017, 6:25 PM

## 2017-12-01 LAB — GLUCOSE, CAPILLARY
Glucose-Capillary: 103 mg/dL — ABNORMAL HIGH (ref 65–99)
Glucose-Capillary: 126 mg/dL — ABNORMAL HIGH (ref 65–99)
Glucose-Capillary: 133 mg/dL — ABNORMAL HIGH (ref 65–99)
Glucose-Capillary: 137 mg/dL — ABNORMAL HIGH (ref 65–99)
Glucose-Capillary: 155 mg/dL — ABNORMAL HIGH (ref 65–99)
Glucose-Capillary: 84 mg/dL (ref 65–99)

## 2017-12-01 LAB — CBC
HCT: 39.2 % (ref 39.0–52.0)
Hemoglobin: 13.2 g/dL (ref 13.0–17.0)
MCH: 30.1 pg (ref 26.0–34.0)
MCHC: 33.7 g/dL (ref 30.0–36.0)
MCV: 89.5 fL (ref 78.0–100.0)
Platelets: 180 10*3/uL (ref 150–400)
RBC: 4.38 MIL/uL (ref 4.22–5.81)
RDW: 14.5 % (ref 11.5–15.5)
WBC: 4.9 10*3/uL (ref 4.0–10.5)

## 2017-12-01 LAB — HEPARIN LEVEL (UNFRACTIONATED): Heparin Unfractionated: 0.41 IU/mL (ref 0.30–0.70)

## 2017-12-01 NOTE — Progress Notes (Signed)
PROGRESS NOTE    Theodore Arellano  WEX:[id number] DOB: 1961/07/30 DOA: 11/27/2017 PCP: [name], MD   Outpatient Specialists:     Brief Narrative:  Theodore Arellano  is a 57 y.o. male, w nicotine dependence, hypertension, hyperlipidemia, dm2, esrd s/p renal transplant in 2011 on chronic immunosuppresive therapy w severe 3V calcicific CAD on cardiac catheterization in 2013 started to have back /; chest pain this afternoon about 3pm.  Pt was driving in his car when he experience chest pain,  Lasted for about 30 min.  Pt was given nitro by EMS but his pain was already fading by that time.  Pt was admitted for evaluation. Patient has had cardiac cath showing multiple vessels involved. Being followed by cardiology and CT surgery. Not a candidate for coronary artery bypass graft   Assessment & Plan:   Active Problems:   Chest pain   Essential hypertension   Type 2 diabetes mellitus with vascular disease (HCC)   Coronary artery disease due to lipid rich plaque   Elevated troponin   Coronary artery disease involving native coronary artery of native heart with unstable angina pectoris (HCC)   Chest pain -S/P Cardiac catheterization with multi vessel disease -Not deemed to be a good candidate for coronary artery bypass graft -Awaiting conversation with interventional radiology and final decision by CT surgery -Renal transplant patient wanting to save his kidneys so minimizing procedures and dyes -Cardiology following and decision-making to follow.  HTN -Continue home meds  DM - largely controlled on current regimen. -Continue SSI -on actos- defer changes to PCP  Chronic back pain -tramadol  ESRD s/p renal transplant -renal functions relatively stable -Avoid nephrotoxic medications -continue anti-rejection meds   DVT prophylaxis:   SCD's  Code Status: Full Code   Family Communication: brother who is at bedside   Disposition Plan:  Most likely home with home  health  Consultants:  Dr Fransico Him, cardiology    Subjective: No chest pain. Patient is currently stable with no symptoms.  Objective: Vitals:   11/30/17 1505 11/30/17 2002 12/01/17 0500 12/01/17 0817  BP: 114/77 140/79 (!) 141/89 131/86  Pulse:  (!) 59 61   Resp:  18 18   Temp: 97.8 F (36.6 C) (!) 97.5 F (36.4 C) 98.3 F (36.8 C)   TempSrc: Oral Oral Oral   SpO2: 94% 95% 94%   Weight:   89.9 kg (198 lb 3.2 oz)   Height:        Intake/Output Summary (Last 24 hours) at 12/01/2017 0840 Last data filed at 12/01/2017 0700 Gross per 24 hour  Intake 260.78 ml  Output -  Net 260.78 ml   Filed Weights   11/28/17 1421 11/30/17 0500 12/01/17 0500  Weight: 88.9 kg (196 lb) 89.9 kg (198 lb 3.1 oz) 89.9 kg (198 lb 3.2 oz)    Examination:  General exam: Appears calm and comfortable  Respiratory system: Clear to auscultation. Respiratory effort normal. Cardiovascular system: S1 & S2 heard, RRR. No JVD, murmurs, rubs, gallops or clicks. No pedal edema. Gastrointestinal system: Abdomen is nondistended, soft and nontender. No organomegaly or masses felt. Normal bowel sounds heard. Central nervous system: Alert and oriented. No focal neurological deficits. Extremities: Symmetric 5 x 5 power. Skin: No rashes, lesions or ulcers Psychiatry: Judgement and insight appear normal. Mood & affect appropriate.     Data Reviewed: I have personally reviewed following labs and imaging studies  CBC: Recent Labs  Lab 11/27/17 1652 11/28/17 0220 11/29/17 1106 11/30/17 0121  12/01/17 0225  WBC 6.8 5.7 6.2 5.7 4.9  NEUTROABS 5.1  --   --   --   --   HGB 14.1 13.8 14.2 12.8* 13.2  HCT 41.7 40.3 43.0 37.5* 39.2  MCV 89.5 89.2 90.5 89.3 89.5  PLT 180 172 187 190 875   Basic Metabolic Panel: Recent Labs  Lab 11/27/17 1652 11/28/17 0220 11/28/17 1856 11/29/17 0415 11/29/17 1106  NA 137 138 136 137  --   K 4.8 4.1 5.1 4.6  --   CL 105 106 103 104  --   CO2 19* 21* 21* 21*  --     GLUCOSE 144* 168* 114* 134*  --   BUN 25* 19 19 18   --   CREATININE 1.39* 1.39* 1.29* 1.30* 1.24  CALCIUM 9.6 9.3 9.4 9.4  --    GFR: Estimated Creatinine Clearance: 70.3 mL/min (by C-G formula based on SCr of 1.24 mg/dL). Liver Function Tests: Recent Labs  Lab 11/27/17 1652 11/28/17 0220  AST 23 25  ALT 20 17  ALKPHOS 58 51  BILITOT 1.2 0.8  PROT 5.8* 6.1*  ALBUMIN 3.5 3.3*   Recent Labs  Lab 11/27/17 1830  LIPASE 25   No results for input(s): AMMONIA in the last 168 hours. Coagulation Profile: Recent Labs  Lab 11/28/17 1856  INR 0.98   Cardiac Enzymes: Recent Labs  Lab 11/28/17 0219 11/28/17 0942 11/28/17 1358  TROPONINI 0.06* 0.06* 0.06*   BNP (last 3 results) No results for input(s): PROBNP in the last 8760 hours. HbA1C: No results for input(s): HGBA1C in the last 72 hours. CBG: Recent Labs  Lab 11/30/17 1627 11/30/17 2023 12/01/17 0025 12/01/17 0447 12/01/17 0722  GLUCAP 138* 174* 133* 84 137*   Lipid Profile: No results for input(s): CHOL, HDL, LDLCALC, TRIG, CHOLHDL, LDLDIRECT in the last 72 hours. Thyroid Function Tests: No results for input(s): TSH, T4TOTAL, FREET4, T3FREE, THYROIDAB in the last 72 hours. Anemia Panel: No results for input(s): VITAMINB12, FOLATE, FERRITIN, TIBC, IRON, RETICCTPCT in the last 72 hours. Urine analysis:    Component Value Date/Time   COLORURINE YELLOW 01/07/2014 0015   APPEARANCEUR CLEAR 01/07/2014 0015   LABSPEC 1.025 01/07/2014 0015   PHURINE 6.0 01/07/2014 0015   GLUCOSEU NEGATIVE 01/07/2014 0015   HGBUR NEGATIVE 01/07/2014 0015   BILIRUBINUR NEGATIVE 01/07/2014 0015   KETONESUR NEGATIVE 01/07/2014 0015   PROTEINUR NEGATIVE 01/07/2014 0015   UROBILINOGEN 1.0 01/07/2014 0015   NITRITE NEGATIVE 01/07/2014 0015   LEUKOCYTESUR NEGATIVE 01/07/2014 0015     )No results found for this or any previous visit (from the past 240 hour(s)).    Anti-infectives (From admission, onward)   None        Radiology Studies: No results found.      Scheduled Meds: . amLODipine  10 mg Oral Daily  . aspirin EC  81 mg Oral Daily  . budesonide  0.5 mg Inhalation BID  . carvedilol  25 mg Oral BID WC  . insulin aspart  0-9 Units Subcutaneous Q4H  . isosorbide mononitrate  30 mg Oral Daily  . lisinopril  10 mg Oral Daily  . magnesium oxide  400 mg Oral BID  . mycophenolate  720 mg Oral BID  . pantoprazole  40 mg Oral Daily  . pioglitazone  15 mg Oral Daily  . predniSONE  5 mg Oral Q breakfast  . ranolazine  500 mg Oral BID  . rosuvastatin  40 mg Oral q1800  . sodium bicarbonate  1,300  mg Oral BID  . sodium chloride flush  3 mL Intravenous Q12H  . tacrolimus  2 mg Oral QHS  . tacrolimus  3 mg Oral Daily   Continuous Infusions: . sodium chloride    . heparin 1,150 Units/hr (11/30/17 1731)     LOS: 1days    Time spent: 25 min    Raffaele Derise,LAWAL, DO [facility] Pager [phone]  If 7PM-7AM, please contact night-coverage [email or web address] Password [email or web address] 12/01/2017, 8:40 AM

## 2017-12-01 NOTE — Progress Notes (Signed)
Subjective:  Cardiac surgery saw yesterday and not felt to be Candidate for CABG.  Overall he still needs to together and evaluate.  Complains of bilateral shoulder pain and continues on heparin.  Objective:  Vital Signs in the last 24 hours: BP 131/86   Pulse 61   Temp 98.3 F (36.8 C) (Oral)   Resp 18   Ht 5\' 7"  (1.702 m)   Wt 89.9 kg (198 lb 3.2 oz)   SpO2 94%   BMI 31.04 kg/m   Physical Exam: Pleasant BM in NAD Lungs:  Clear Cardiac:  Regular rhythm, normal S1 and S2, no S3 Extremities:  Cath site clean and dry   Intake/Output from previous day: 03/02 0701 - 03/03 0700 In: 260.8 [I.V.:260.8] Out: -   Weight Filed Weights   11/28/17 1421 11/30/17 0500 12/01/17 0500  Weight: 88.9 kg (196 lb) 89.9 kg (198 lb 3.1 oz) 89.9 kg (198 lb 3.2 oz)    Lab Results: Basic Metabolic Panel: Recent Labs    11/28/17 1856 11/29/17 0415 11/29/17 1106  NA 136 137  --   K 5.1 4.6  --   CL 103 104  --   CO2 21* 21*  --   GLUCOSE 114* 134*  --   BUN 19 18  --   CREATININE 1.29* 1.30* 1.24   CBC: Recent Labs    11/30/17 0121 12/01/17 0225  WBC 5.7 4.9  HGB 12.8* 13.2  HCT 37.5* 39.2  MCV 89.3 89.5  PLT 190 180   Cardiac Enzymes: Troponin (Point of Care Test) No results for input(s): TROPIPOC in the last 72 hours. Cardiac Panel (last 3 results) Recent Labs    11/28/17 1358  TROPONINI 0.06*    Telemetry: Sinus rhythm  Assessment/Plan:  1. Severe calcific 3VD 2.  History of renal transplant 3.  Type 2 diabetes 4.  Hypertension  Rec:  Continue heparin and await results of multidisciplinary team about path going forward.      Kerry Hough  MD [facility] Cardiology  12/01/2017, 11:03 AM

## 2017-12-01 NOTE — Progress Notes (Signed)
ANTICOAGULATION CONSULT NOTE - Follow Up Consult  Pharmacy Consult for heparin Indication: chest pain/ACS  Allergies  Allergen Reactions  . Percocet [Oxycodone-Acetaminophen] Hives and Itching  . Vicodin [Hydrocodone-Acetaminophen] Hives and Itching  . Shellfish Allergy Hives  . Tomato Hives    Patient Measurements: Height: 5\' 7"  (170.2 cm) Weight: 198 lb 3.1 oz (89.9 kg) IBW/kg (Calculated) : 66.1 Heparin Dosing Weight: 84 kg  Vital Signs: Temp: 97.5 F (36.4 C) (03/02 2002) Temp Source: Oral (03/02 2002) BP: 140/79 (03/02 2002) Pulse Rate: 59 (03/02 2002)  Labs: Recent Labs    11/28/17 1245 11/28/17 1358 11/28/17 1856 11/29/17 0415  11/29/17 1106  11/30/17 0121 11/30/17 0928 11/30/17 1621 12/01/17 0225  HGB  --   --   --   --    < > 14.2  --  12.8*  --   --  13.2  HCT  --   --   --   --   --  43.0  --  37.5*  --   --  39.2  PLT  --   --   --   --   --  187  --  190  --   --  180  LABPROT  --   --  12.9  --   --   --   --   --   --   --   --   INR  --   --  0.98  --   --   --   --   --   --   --   --   HEPARINUNFRC  --   --   --   --   --   --    < > 0.82* 0.16* 0.26* 0.41  CREATININE  --   --  1.29* 1.30*  --  1.24  --   --   --   --   --   TROPONINI 0.06* 0.06*  --   --   --   --   --   --   --   --   --    < > = values in this interval not displayed.    Estimated Creatinine Clearance: 70.3 mL/min (by C-G formula based on SCr of 1.24 mg/dL).   Medications:  Scheduled:  . amLODipine  10 mg Oral Daily  . aspirin EC  81 mg Oral Daily  . budesonide  0.5 mg Inhalation BID  . carvedilol  25 mg Oral BID WC  . insulin aspart  0-9 Units Subcutaneous Q4H  . isosorbide mononitrate  30 mg Oral Daily  . lisinopril  10 mg Oral Daily  . magnesium oxide  400 mg Oral BID  . mycophenolate  720 mg Oral BID  . pantoprazole  40 mg Oral Daily  . pioglitazone  15 mg Oral Daily  . predniSONE  5 mg Oral Q breakfast  . ranolazine  500 mg Oral BID  . rosuvastatin  40 mg  Oral q1800  . sodium bicarbonate  1,300 mg Oral BID  . sodium chloride flush  3 mL Intravenous Q12H  . tacrolimus  2 mg Oral QHS  . tacrolimus  3 mg Oral Daily   Infusions:  . sodium chloride    . heparin 1,150 Units/hr (11/30/17 1731)    Assessment: 56 YOM with severe CAD on heparin post-cath 3/1, awaiting intervention decision from cardiology/CVTS. Pharmacy on board to dose Heparin for anticoagulation.  Heparin level this morning is therapeutic after a  rate increase yesterday. No issues noted.    Goal of Therapy:  Heparin level 0.3-0.7 units/ml Monitor platelets by anticoagulation protocol: Yes   Plan:  Continue heparin at 1150 units/hr (11.5 ml/hr) Will continue to monitor for any signs/symptoms of bleeding  Thank you for allowing pharmacy to be a part of this patient's care.  Erin Hearing PharmD., BCPS Clinical Pharmacist 12/01/2017 3:17 AM

## 2017-12-02 DIAGNOSIS — I209 Angina pectoris, unspecified: Secondary | ICD-10-CM

## 2017-12-02 LAB — GLUCOSE, CAPILLARY
Glucose-Capillary: 127 mg/dL — ABNORMAL HIGH (ref 65–99)
Glucose-Capillary: 115 mg/dL — ABNORMAL HIGH (ref 65–99)
Glucose-Capillary: 107 mg/dL — ABNORMAL HIGH (ref 65–99)
Glucose-Capillary: 142 mg/dL — ABNORMAL HIGH (ref 65–99)

## 2017-12-02 LAB — HEPARIN LEVEL (UNFRACTIONATED): Heparin Unfractionated: 0.36 IU/mL (ref 0.30–0.70)

## 2017-12-02 LAB — CBC
HCT: 40.5 % (ref 39.0–52.0)
Hemoglobin: 13.4 g/dL (ref 13.0–17.0)
MCH: 29.8 pg (ref 26.0–34.0)
MCHC: 33.1 g/dL (ref 30.0–36.0)
MCV: 90 fL (ref 78.0–100.0)
Platelets: 184 10*3/uL (ref 150–400)
RBC: 4.5 MIL/uL (ref 4.22–5.81)
RDW: 14.9 % (ref 11.5–15.5)
WBC: 5.3 10*3/uL (ref 4.0–10.5)

## 2017-12-02 MED ORDER — ROSUVASTATIN CALCIUM 40 MG PO TABS: 40.0000 mg | ORAL_TABLET | Freq: Every day | ORAL | 0 refills | Status: DC

## 2017-12-02 MED ORDER — ISOSORBIDE MONONITRATE ER 30 MG PO TB24: 30.0000 mg | ORAL_TABLET | Freq: Every day | ORAL | 0 refills | Status: DC

## 2017-12-02 NOTE — Progress Notes (Signed)
Progress Note  Patient Name: Theodore Arellano Date of Encounter: 12/02/2017  Primary Cardiologist: No primary care provider on file.   Subjective   No CP, no SOB. Ambulated.   Inpatient Medications    Scheduled Meds: . amLODipine  10 mg Oral Daily  . aspirin EC  81 mg Oral Daily  . budesonide  0.5 mg Inhalation BID  . carvedilol  25 mg Oral BID WC  . insulin aspart  0-9 Units Subcutaneous Q4H  . isosorbide mononitrate  30 mg Oral Daily  . lisinopril  10 mg Oral Daily  . magnesium oxide  400 mg Oral BID  . mycophenolate  720 mg Oral BID  . pantoprazole  40 mg Oral Daily  . pioglitazone  15 mg Oral Daily  . predniSONE  5 mg Oral Q breakfast  . ranolazine  500 mg Oral BID  . rosuvastatin  40 mg Oral q1800  . sodium bicarbonate  1,300 mg Oral BID  . sodium chloride flush  3 mL Intravenous Q12H  . tacrolimus  2 mg Oral QHS  . tacrolimus  3 mg Oral Daily   Continuous Infusions: . sodium chloride    . heparin 1,150 Units/hr (12/01/17 1911)   PRN Meds: sodium chloride, acetaminophen **OR** acetaminophen, ALPRAZolam, colchicine, sodium chloride flush, traMADol   Vital Signs    Vitals:   12/01/17 2009 12/01/17 2015 12/02/17 0418 12/02/17 0748  BP:  140/84 125/82 123/88  Pulse:  60 60 61  Resp:      Temp:  (!) 97.5 F (36.4 C) (!) 97.2 F (36.2 C) 97.9 F (36.6 C)  TempSrc:  Oral Oral Oral  SpO2: 98% 99% 99%   Weight:   193 lb (87.5 kg)   Height:        Intake/Output Summary (Last 24 hours) at 12/02/2017 0930 Last data filed at 12/02/2017 0700 Gross per 24 hour  Intake 996 ml  Output 700 ml  Net 296 ml   Filed Weights   11/30/17 0500 12/01/17 0500 12/02/17 0418  Weight: 198 lb 3.1 oz (89.9 kg) 198 lb 3.2 oz (89.9 kg) 193 lb (87.5 kg)    Telemetry    NSR - Personally Reviewed  ECG    NSR, NSSTW changes - Personally Reviewed  Physical Exam   GEN: No acute distress.   Neck: No JVD Cardiac: RRR, no murmurs, rubs, or gallops.  Respiratory: Clear to  auscultation bilaterally. GI: Soft, nontender, non-distended  MS: No edema; No deformity. Neuro:  Nonfocal  Psych: Normal affect   Labs    Chemistry Recent Labs  Lab 11/27/17 1652 11/28/17 0220 11/28/17 1856 11/29/17 0415 11/29/17 1106  NA 137 138 136 137  --   K 4.8 4.1 5.1 4.6  --   CL 105 106 103 104  --   CO2 19* 21* 21* 21*  --   GLUCOSE 144* 168* 114* 134*  --   BUN 25* 19 19 18   --   CREATININE 1.39* 1.39* 1.29* 1.30* 1.24  CALCIUM 9.6 9.3 9.4 9.4  --   PROT 5.8* 6.1*  --   --   --   ALBUMIN 3.5 3.3*  --   --   --   AST 23 25  --   --   --   ALT 20 17  --   --   --   ALKPHOS 58 51  --   --   --   BILITOT 1.2 0.8  --   --   --  GFRNONAA 55* 55* 60* 59* >60  GFRAA >60 >60 >60 >60 >60  ANIONGAP 13 11 12 12   --      Hematology Recent Labs  Lab 11/30/17 0121 12/01/17 0225 12/02/17 0452  WBC 5.7 4.9 5.3  RBC 4.20* 4.38 4.50  HGB 12.8* 13.2 13.4  HCT 37.5* 39.2 40.5  MCV 89.3 89.5 90.0  MCH 30.5 30.1 29.8  MCHC 34.1 33.7 33.1  RDW 14.4 14.5 14.9  PLT 190 180 184    Cardiac Enzymes Recent Labs  Lab 11/28/17 0219 11/28/17 0942 11/28/17 1358  TROPONINI 0.06* 0.06* 0.06*    Recent Labs  Lab 11/27/17 1731 11/27/17 2144  TROPIPOC 0.04 0.05     BNPNo results for input(s): BNP, PROBNP in the last 168 hours.   DDimer No results for input(s): DDIMER in the last 168 hours.   Radiology    No results found.  Cardiac Studies   Cath 11/29/17:  Ost LAD to Prox LAD lesion is 80% stenosed. Prox LAD lesion is 100% stenosed. -- distal LAD partially filled via L-L/Bridging collaterals: Mid LAD to Dist LAD lesion is 90% stenosed.  Bifurcating ramus intermedius: Ost Ramus lesion is 95% stenosed. Ramus lesion is 70% stenosed. Lat Ramus lesion is 70% stenosed.  Bifurcating OM1: Ost 1st Mrg lesion is 99% stenosed with 99% stenosed side branch in Lat 1st Mrg. Ost 1st Mrg to 1st Mrg lesion is 100% stenosed.  Mid Cx to Dist Cx lesion is 75% stenosed. The distal  circumflex faintly collateralizes the right posterior lateral system  Previously 100% occluded. RCA - Prox RCA lesion is 100% stenosed. -Partial recanalization and bridging collaterals faintly fill the vessel  Prox RCA to Mid RCA lesion is 90% stenosed. Mid RCA to Dist RCA lesion is 70% stenosed.  Post Atrio lesion is 100% stenosed. - L-R collaterals do not fill to RPDA   Severe Multivessel CAD with extensive calcification & dramatic progression of disease from last cath.  Likely not a PCI candidate given extent of calcified disease & renal transplant (unless HD is an option).  Could consider CT Surgical Consult - but distal targets are questionable. (not well filled) There is extensive calcification throughout.  This extent of disease the demonstrates dramatic progression of disease from prior catheterization. The occluded RCA and LAD seem to have partial recanalization versus bridging collaterals and are extensively calcified.  The OM1 branch is subtotally occluded proximally before it bifurcates into 2 major branches that are severely diseased.  The ramus intermedius also severely diseased proximally and then has disease in both branches.  Distal targets are questionable for bypass graft targets.  There are not many good options for this gentleman who is hoping to avoid going back on dialysis after transplant kidney. Global team discussion will be needed to decide the best option.  Plan: He will return to his nursing unit after sheath removal in the PACU holding area.  Continue medical management per primary cardiology team.   Glenetta Hew, M.D., M.S.  Patient Profile     57 y.o. male with severe CAD, multivessel, transplanted kidney with CKD 3  Assessment & Plan    CAD with ANGINA  - Appreciate TCTS consult, Dr. Roxan Hockey. Not candidate for surgery  - Dr. Ellyn Hack cath note as above. Felt not to be good PCI candidate either.   - Med mgt  Angina  - Ranexa, Bb, Imdur. No CP  now.   Transplanted kidney  - per renal. Trying to avoid excessive contrast  Diabetes  with hypertension  - well controlled.   Tobacco  - understands not to smoke.   Elevated trop  - 0.6, flat.   Put in a call to Dr. Ellyn Hack with interventional team. We may be leaning to medical mgt. If so, likely DC today. More to come.   For questions or updates, please contact [place] Please consult [email or web address] for contact info under Cardiology/STEMI.      Signed, Candee Furbish, MD  12/02/2017, 9:30 AM

## 2017-12-02 NOTE — Discharge Instructions (Signed)

## 2017-12-02 NOTE — Progress Notes (Signed)
   Discussed with Dr. Ellyn Hack. For now continue with aggressive medical management.  OK to DC home. Follow up with Dr. Salley Scarlet, [facility]. Thanks.   Candee Furbish, MD

## 2017-12-02 NOTE — Progress Notes (Signed)
ANTICOAGULATION CONSULT NOTE - Follow Up Consult  Pharmacy Consult for Heparin Indication: chest pain/ACS, CAD  Patient Measurements: Height: 5\' 7"  (170.2 cm) Weight: 193 lb (87.5 kg) IBW/kg (Calculated) : 66.1 Heparin Dosing Weight: 84 kg  Vital Signs: Temp: 97.9 F (36.6 C) (03/04 0748) Temp Source: Oral (03/04 0748) BP: 119/80 (03/04 0954) Pulse Rate: 61 (03/04 0748)  Labs: Recent Labs    11/29/17 1106 11/30/17 0121  11/30/17 1621 12/01/17 0225 12/02/17 0452  HGB 14.2 12.8*  --   --  13.2 13.4  HCT 43.0 37.5*  --   --  39.2 40.5  PLT 187 190  --   --  180 184  HEPARINUNFRC  --  0.82*   < > 0.26* 0.41 0.36  CREATININE 1.24  --   --   --   --   --    < > = values in this interval not displayed.    Estimated Creatinine Clearance: 69.4 mL/min (by C-G formula based on SCr of 1.24 mg/dL).  Assessment:  57 yr old male with severe CAD on IV heparin post-cath 3/1.  Not surgical candidate and also not felt to be good PCI candidate.   Heparin level remains therapeutic (0.36) on 1150 units/hr. CBC stable.  Goal of Therapy:  Heparin level 0.3-0.7 units/ml Monitor platelets by anticoagulation protocol: Yes   Plan:   Continue heparin drip at 1150 units/hr.  Daily heparin level and CBC while on heparin.  Follow up length of therapy.  Arty Baumgartner, [place] Pager: [phone] or [phone] 12/02/2017,10:17 AM

## 2017-12-02 NOTE — Progress Notes (Signed)
Patient received discharge information and acknowledged understanding of it. Patient IVs were removed.  

## 2017-12-04 NOTE — Discharge Summary (Signed)
Physician Discharge Summary  Theodore Arellano ZOX:[id number] DOB: [birth year] DOA: 11/27/2017  PCP: [name], MD  Admit date: 11/27/2017 Discharge date: 12/04/2017  Time spent: 33 minutes  Recommendations for Outpatient Follow-up:  1. Follow-up with PCP and Cardiology   Discharge Diagnoses:  Active Problems:   Chest pain   Essential hypertension   Type 2 diabetes mellitus with vascular disease (HCC)   Coronary artery disease due to lipid rich plaque   Elevated troponin   Coronary artery disease involving native coronary artery of native heart with unstable angina pectoris [facility])   Discharge Condition: Good  Diet recommendation: cardiac and diabetic  Filed Weights   11/30/17 0500 12/01/17 0500 12/02/17 0418  Weight: 89.9 kg (198 lb 3.1 oz) 89.9 kg (198 lb 3.2 oz) 87.5 kg (193 lb)    History of present illness:  FelixBrownis a56 y.o.male,w nicotine dependence, hypertension, hyperlipidemia, dm2, esrd s/p renal transplant in 2011 on chronic immunosuppresive therapy w severe 3V calcicific CAD on cardiac catheterization in 2013started to have back /; chest pain this afternoon about 3pm. Pt was driving in his car when he experience chest pain, Lasted for about 30 min. Pt was given nitro by EMS but his pain was already fading by that time. Pt was admitted for evaluation.     Hospital Course:  Patient was admitted and evaluated. Patient had cardiac cath showing multiple vessels involved. He was followed by cardiology and CT surgery. Not a candidate for coronary artery bypass grafting. Medical management was therefore suggested.  He has other chronic medical problems including hypertension and diabetes. Patient also had chronic back pain as well as chronic kidney disease stage IV.  With his previous kidney transplant F first web made to avoid  Excessive dye and any nephrotoxic medications. History renal function has remained  Stable at baseline.  He will follow up with  cardiology in the outpatient with medical management.   Procedures:  Cardiac catheterization (i.e. Studies not automatically included, echos, thoracentesis, etc; not x-rays)  Consultations:  Cardiology [facility]  CT surgery Dr. Roxan Hockey  Discharge Exam: Vitals:   12/02/17 1122 12/02/17 1430  BP: 106/75 108/60  Pulse: 64 67  Resp:    Temp: 97.8 F (36.6 C) 97.8 F (36.6 C)  SpO2:      General: stable with no acute distress Cardiovascular: regular rate and rhythm Respiratory: good air entry bilaterally  Discharge Instructions   Discharge Instructions    Diet - low sodium heart healthy   Complete by:  As directed    Increase activity slowly   Complete by:  As directed      Allergies as of 12/02/2017      Reactions   Percocet [oxycodone-acetaminophen] Hives, Itching   Vicodin [hydrocodone-acetaminophen] Hives, Itching   Shellfish Allergy Hives   Tomato Hives      Medication List    STOP taking these medications   atorvastatin 20 MG tablet Commonly known as:  LIPITOR     TAKE these medications   amLODipine 10 MG tablet Commonly known as:  NORVASC Take 10 mg by mouth daily.   aspirin EC 81 MG tablet Take 81 mg by mouth daily.   budesonide 180 MCG/ACT inhaler Commonly known as:  PULMICORT Inhale 2 puffs into the lungs 2 (two) times daily as needed (wheezing).   carvedilol 25 MG tablet Commonly known as:  COREG   colchicine 0.6 MG tablet Take 0.6 mg by mouth daily as needed (for gout).   isosorbide mononitrate 30  MG 24 hr tablet Commonly known as:  IMDUR Take 1 tablet (30 mg total) by mouth daily.   lisinopril 10 MG tablet Commonly known as:  PRINIVIL,ZESTRIL Take 1 tablet (10 mg total) by mouth daily.   magnesium oxide 400 MG tablet Commonly known as:  MAG-OX Take 400 mg by mouth 2 (two) times daily.   mycophenolate 360 MG Tbec EC tablet Commonly known as:  MYFORTIC Take 720 mg by mouth 2 (two) times daily.   NITROSTAT 0.4 MG SL  tablet Generic drug:  nitroGLYCERIN Place 0.4 mg under the tongue every 5 (five) minutes as needed for chest pain.   omeprazole 20 MG capsule Commonly known as:  PRILOSEC Take 20 mg by mouth daily.   pioglitazone 15 MG tablet Commonly known as:  ACTOS Take 15 mg by mouth daily as needed (for sugar).   predniSONE 5 MG tablet Commonly known as:  DELTASONE Take 5 mg by mouth every morning.   ranolazine 500 MG 12 hr tablet Commonly known as:  RANEXA Take 500 mg by mouth 2 (two) times daily.   rosuvastatin 40 MG tablet Commonly known as:  CRESTOR Take 1 tablet (40 mg total) by mouth daily at 6 PM.   sodium bicarbonate 650 MG tablet Take 1,300 mg by mouth 2 (two) times daily.   tacrolimus 1 MG capsule Commonly known as:  PROGRAF Take 2-3 mg by mouth 2 (two) times daily. Take 3 mg every AM and 2 mg every PM   traMADol 50 MG tablet Commonly known as:  ULTRAM Take 50-100 mg by mouth every 4 (four) hours as needed for pain.   triamcinolone cream 0.1 % Commonly known as:  KENALOG Apply 1 application topically as needed (for skin).      Allergies  Allergen Reactions  . Percocet [Oxycodone-Acetaminophen] Hives and Itching  . Vicodin [Hydrocodone-Acetaminophen] Hives and Itching  . Shellfish Allergy Hives  . Tomato Hives      The results of significant diagnostics from this hospitalization (including imaging, microbiology, ancillary and laboratory) are listed below for reference.    Significant Diagnostic Studies: Dg Chest 2 View  Result Date: 11/27/2017 CLINICAL DATA:  Mid back pain radiating to the central chest for the past hour. Smoker. EXAM: CHEST  2 VIEW COMPARISON:  08/26/2016. FINDINGS: Normal sized heart. Tortuous aorta. Clear lungs. The lungs are mildly hyperexpanded. Minimal peribronchial thickening. Unremarkable bones. Atheromatous abdominal aortic calcifications. IMPRESSION: No acute abnormality. Minimal changes of COPD and chronic bronchitis. Electronically  Signed   By: Claudie Revering M.D.   On: 11/27/2017 18:18    Microbiology: No results found for this or any previous visit (from the past 240 hour(s)).   Labs: Basic Metabolic Panel: Recent Labs  Lab 11/28/17 0220 11/28/17 1856 11/29/17 0415 11/29/17 1106  NA 138 136 137  --   K 4.1 5.1 4.6  --   CL 106 103 104  --   CO2 21* 21* 21*  --   GLUCOSE 168* 114* 134*  --   BUN 19 19 18   --   CREATININE 1.39* 1.29* 1.30* 1.24  CALCIUM 9.3 9.4 9.4  --    Liver Function Tests: Recent Labs  Lab 11/28/17 0220  AST 25  ALT 17  ALKPHOS 51  BILITOT 0.8  PROT 6.1*  ALBUMIN 3.3*   Recent Labs  Lab 11/27/17 1830  LIPASE 25   No results for input(s): AMMONIA in the last 168 hours. CBC: Recent Labs  Lab 11/28/17 0220 11/29/17 1106 11/30/17  0121 12/01/17 0225 12/02/17 0452  WBC 5.7 6.2 5.7 4.9 5.3  HGB 13.8 14.2 12.8* 13.2 13.4  HCT 40.3 43.0 37.5* 39.2 40.5  MCV 89.2 90.5 89.3 89.5 90.0  PLT 172 187 190 180 184   Cardiac Enzymes: Recent Labs  Lab 11/28/17 0219 11/28/17 0942 11/28/17 1358  TROPONINI 0.06* 0.06* 0.06*   BNP: BNP (last 3 results) No results for input(s): BNP in the last 8760 hours.  ProBNP (last 3 results) No results for input(s): PROBNP in the last 8760 hours.  CBG: Recent Labs  Lab 12/01/17 2023 12/01/17 2349 12/02/17 0426 12/02/17 0737 12/02/17 1120  GLUCAP 126* 103* 115* 107* 142*       SignedBarbette Merino MD.  [facility] 12/04/2017, 4:58 PM

## 2018-01-08 DIAGNOSIS — I119 Hypertensive heart disease without heart failure: Secondary | ICD-10-CM | POA: Diagnosis not present

## 2018-01-08 DIAGNOSIS — E785 Hyperlipidemia, unspecified: Secondary | ICD-10-CM | POA: Diagnosis not present

## 2018-01-08 DIAGNOSIS — I519 Heart disease, unspecified: Secondary | ICD-10-CM | POA: Insufficient documentation

## 2018-01-08 DIAGNOSIS — Z720 Tobacco use: Secondary | ICD-10-CM | POA: Diagnosis not present

## 2018-01-08 DIAGNOSIS — Z940 Kidney transplant status: Secondary | ICD-10-CM | POA: Diagnosis not present

## 2018-01-08 DIAGNOSIS — N390 Urinary tract infection, site not specified: Secondary | ICD-10-CM | POA: Diagnosis not present

## 2018-01-08 DIAGNOSIS — N2581 Secondary hyperparathyroidism of renal origin: Secondary | ICD-10-CM | POA: Diagnosis not present

## 2018-01-08 DIAGNOSIS — I2510 Atherosclerotic heart disease of native coronary artery without angina pectoris: Secondary | ICD-10-CM | POA: Diagnosis not present

## 2018-01-10 DIAGNOSIS — Z720 Tobacco use: Secondary | ICD-10-CM | POA: Diagnosis not present

## 2018-01-10 DIAGNOSIS — I2510 Atherosclerotic heart disease of native coronary artery without angina pectoris: Secondary | ICD-10-CM | POA: Diagnosis not present

## 2018-01-10 DIAGNOSIS — I129 Hypertensive chronic kidney disease with stage 1 through stage 4 chronic kidney disease, or unspecified chronic kidney disease: Secondary | ICD-10-CM | POA: Diagnosis not present

## 2018-01-10 DIAGNOSIS — D631 Anemia in chronic kidney disease: Secondary | ICD-10-CM | POA: Diagnosis not present

## 2018-01-10 DIAGNOSIS — Z940 Kidney transplant status: Secondary | ICD-10-CM | POA: Diagnosis not present

## 2018-01-10 DIAGNOSIS — N2581 Secondary hyperparathyroidism of renal origin: Secondary | ICD-10-CM | POA: Diagnosis not present

## 2018-01-10 DIAGNOSIS — E785 Hyperlipidemia, unspecified: Secondary | ICD-10-CM | POA: Diagnosis not present

## 2018-03-13 ENCOUNTER — Ambulatory Visit: Payer: Medicare Other | Admitting: Sports Medicine

## 2018-03-20 ENCOUNTER — Ambulatory Visit: Payer: Medicare Other | Admitting: Sports Medicine

## 2018-04-11 DIAGNOSIS — M545 Low back pain: Secondary | ICD-10-CM | POA: Diagnosis not present

## 2018-04-22 DIAGNOSIS — Z940 Kidney transplant status: Secondary | ICD-10-CM | POA: Diagnosis not present

## 2018-04-22 DIAGNOSIS — I129 Hypertensive chronic kidney disease with stage 1 through stage 4 chronic kidney disease, or unspecified chronic kidney disease: Secondary | ICD-10-CM | POA: Diagnosis not present

## 2018-05-23 DIAGNOSIS — F17200 Nicotine dependence, unspecified, uncomplicated: Secondary | ICD-10-CM | POA: Diagnosis not present

## 2018-05-23 DIAGNOSIS — I2510 Atherosclerotic heart disease of native coronary artery without angina pectoris: Secondary | ICD-10-CM | POA: Diagnosis not present

## 2018-07-14 DIAGNOSIS — Z23 Encounter for immunization: Secondary | ICD-10-CM | POA: Diagnosis not present

## 2018-10-20 DIAGNOSIS — Z940 Kidney transplant status: Secondary | ICD-10-CM | POA: Diagnosis not present

## 2018-10-20 DIAGNOSIS — I129 Hypertensive chronic kidney disease with stage 1 through stage 4 chronic kidney disease, or unspecified chronic kidney disease: Secondary | ICD-10-CM | POA: Diagnosis not present

## 2018-12-30 DIAGNOSIS — I10 Essential (primary) hypertension: Secondary | ICD-10-CM | POA: Diagnosis not present

## 2019-03-18 DIAGNOSIS — G8929 Other chronic pain: Secondary | ICD-10-CM | POA: Diagnosis not present

## 2019-03-18 DIAGNOSIS — M25559 Pain in unspecified hip: Secondary | ICD-10-CM | POA: Diagnosis not present

## 2019-03-18 DIAGNOSIS — L299 Pruritus, unspecified: Secondary | ICD-10-CM | POA: Diagnosis not present

## 2019-04-02 DIAGNOSIS — I10 Essential (primary) hypertension: Secondary | ICD-10-CM | POA: Diagnosis not present

## 2019-04-02 DIAGNOSIS — I700 Atherosclerosis of aorta: Secondary | ICD-10-CM | POA: Diagnosis not present

## 2019-04-02 DIAGNOSIS — Z7982 Long term (current) use of aspirin: Secondary | ICD-10-CM | POA: Diagnosis not present

## 2019-04-02 DIAGNOSIS — M47816 Spondylosis without myelopathy or radiculopathy, lumbar region: Secondary | ICD-10-CM | POA: Diagnosis not present

## 2019-04-02 DIAGNOSIS — F17210 Nicotine dependence, cigarettes, uncomplicated: Secondary | ICD-10-CM | POA: Diagnosis not present

## 2019-04-02 DIAGNOSIS — I2510 Atherosclerotic heart disease of native coronary artery without angina pectoris: Secondary | ICD-10-CM | POA: Diagnosis not present

## 2019-04-02 DIAGNOSIS — M545 Low back pain: Secondary | ICD-10-CM | POA: Diagnosis not present

## 2019-04-02 DIAGNOSIS — Z79899 Other long term (current) drug therapy: Secondary | ICD-10-CM | POA: Diagnosis not present

## 2019-04-02 DIAGNOSIS — E119 Type 2 diabetes mellitus without complications: Secondary | ICD-10-CM | POA: Diagnosis not present

## 2019-04-02 DIAGNOSIS — E785 Hyperlipidemia, unspecified: Secondary | ICD-10-CM | POA: Diagnosis not present

## 2019-04-02 DIAGNOSIS — K219 Gastro-esophageal reflux disease without esophagitis: Secondary | ICD-10-CM | POA: Diagnosis not present

## 2019-04-16 DIAGNOSIS — Z940 Kidney transplant status: Secondary | ICD-10-CM | POA: Diagnosis not present

## 2019-04-16 DIAGNOSIS — I129 Hypertensive chronic kidney disease with stage 1 through stage 4 chronic kidney disease, or unspecified chronic kidney disease: Secondary | ICD-10-CM | POA: Diagnosis not present

## 2019-05-30 ENCOUNTER — Encounter (HOSPITAL_COMMUNITY): Payer: Self-pay | Admitting: *Deleted

## 2019-05-30 ENCOUNTER — Emergency Department (HOSPITAL_COMMUNITY): Admit: 2019-05-30 | Payer: Medicare Other | Admitting: Cardiovascular Disease

## 2019-05-30 ENCOUNTER — Inpatient Hospital Stay (HOSPITAL_COMMUNITY)
Admission: EM | Admit: 2019-05-30 | Discharge: 2019-06-01 | DRG: 280 | Disposition: A | Discharge status: Home / Self Care | Payer: Medicare Other | Attending: Cardiology | Admitting: Cardiology

## 2019-05-30 ENCOUNTER — Encounter (HOSPITAL_COMMUNITY): Payer: Self-pay

## 2019-05-30 ENCOUNTER — Emergency Department (HOSPITAL_COMMUNITY): Payer: Medicare Other

## 2019-05-30 ENCOUNTER — Other Ambulatory Visit: Payer: Self-pay

## 2019-05-30 DIAGNOSIS — I499 Cardiac arrhythmia, unspecified: Secondary | ICD-10-CM | POA: Diagnosis not present

## 2019-05-30 DIAGNOSIS — W57XXXA Bitten or stung by nonvenomous insect and other nonvenomous arthropods, initial encounter: Secondary | ICD-10-CM | POA: Diagnosis present

## 2019-05-30 DIAGNOSIS — Z7951 Long term (current) use of inhaled steroids: Secondary | ICD-10-CM | POA: Diagnosis not present

## 2019-05-30 DIAGNOSIS — Z91018 Allergy to other foods: Secondary | ICD-10-CM

## 2019-05-30 DIAGNOSIS — Z7952 Long term (current) use of systemic steroids: Secondary | ICD-10-CM

## 2019-05-30 DIAGNOSIS — F17210 Nicotine dependence, cigarettes, uncomplicated: Secondary | ICD-10-CM | POA: Diagnosis present

## 2019-05-30 DIAGNOSIS — R079 Chest pain, unspecified: Secondary | ICD-10-CM | POA: Diagnosis not present

## 2019-05-30 DIAGNOSIS — F419 Anxiety disorder, unspecified: Secondary | ICD-10-CM | POA: Diagnosis present

## 2019-05-30 DIAGNOSIS — E1159 Type 2 diabetes mellitus with other circulatory complications: Secondary | ICD-10-CM | POA: Diagnosis not present

## 2019-05-30 DIAGNOSIS — I10 Essential (primary) hypertension: Secondary | ICD-10-CM | POA: Diagnosis not present

## 2019-05-30 DIAGNOSIS — I132 Hypertensive heart and chronic kidney disease with heart failure and with stage 5 chronic kidney disease, or end stage renal disease: Secondary | ICD-10-CM | POA: Diagnosis present

## 2019-05-30 DIAGNOSIS — I509 Heart failure, unspecified: Secondary | ICD-10-CM | POA: Diagnosis present

## 2019-05-30 DIAGNOSIS — Z91013 Allergy to seafood: Secondary | ICD-10-CM | POA: Diagnosis not present

## 2019-05-30 DIAGNOSIS — Z885 Allergy status to narcotic agent status: Secondary | ICD-10-CM | POA: Diagnosis not present

## 2019-05-30 DIAGNOSIS — Z940 Kidney transplant status: Secondary | ICD-10-CM | POA: Diagnosis not present

## 2019-05-30 DIAGNOSIS — Z8249 Family history of ischemic heart disease and other diseases of the circulatory system: Secondary | ICD-10-CM | POA: Diagnosis not present

## 2019-05-30 DIAGNOSIS — Z20828 Contact with and (suspected) exposure to other viral communicable diseases: Secondary | ICD-10-CM | POA: Diagnosis present

## 2019-05-30 DIAGNOSIS — Z7982 Long term (current) use of aspirin: Secondary | ICD-10-CM | POA: Diagnosis not present

## 2019-05-30 DIAGNOSIS — I213 ST elevation (STEMI) myocardial infarction of unspecified site: Secondary | ICD-10-CM | POA: Diagnosis not present

## 2019-05-30 DIAGNOSIS — N186 End stage renal disease: Secondary | ICD-10-CM | POA: Diagnosis not present

## 2019-05-30 DIAGNOSIS — I2510 Atherosclerotic heart disease of native coronary artery without angina pectoris: Secondary | ICD-10-CM | POA: Diagnosis present

## 2019-05-30 DIAGNOSIS — R778 Other specified abnormalities of plasma proteins: Secondary | ICD-10-CM | POA: Diagnosis present

## 2019-05-30 DIAGNOSIS — S40862A Insect bite (nonvenomous) of left upper arm, initial encounter: Secondary | ICD-10-CM | POA: Diagnosis present

## 2019-05-30 DIAGNOSIS — I214 Non-ST elevation (NSTEMI) myocardial infarction: Principal | ICD-10-CM | POA: Diagnosis present

## 2019-05-30 DIAGNOSIS — I5022 Chronic systolic (congestive) heart failure: Secondary | ICD-10-CM | POA: Diagnosis present

## 2019-05-30 DIAGNOSIS — R9431 Abnormal electrocardiogram [ECG] [EKG]: Secondary | ICD-10-CM | POA: Diagnosis not present

## 2019-05-30 DIAGNOSIS — G8929 Other chronic pain: Secondary | ICD-10-CM | POA: Diagnosis present

## 2019-05-30 DIAGNOSIS — E782 Mixed hyperlipidemia: Secondary | ICD-10-CM | POA: Diagnosis not present

## 2019-05-30 DIAGNOSIS — I200 Unstable angina: Secondary | ICD-10-CM | POA: Diagnosis not present

## 2019-05-30 DIAGNOSIS — S40861A Insect bite (nonvenomous) of right upper arm, initial encounter: Secondary | ICD-10-CM | POA: Diagnosis present

## 2019-05-30 DIAGNOSIS — R0789 Other chest pain: Secondary | ICD-10-CM | POA: Diagnosis not present

## 2019-05-30 DIAGNOSIS — E785 Hyperlipidemia, unspecified: Secondary | ICD-10-CM | POA: Diagnosis present

## 2019-05-30 DIAGNOSIS — M545 Low back pain: Secondary | ICD-10-CM | POA: Diagnosis present

## 2019-05-30 DIAGNOSIS — I2583 Coronary atherosclerosis due to lipid rich plaque: Secondary | ICD-10-CM | POA: Diagnosis not present

## 2019-05-30 DIAGNOSIS — R7989 Other specified abnormal findings of blood chemistry: Secondary | ICD-10-CM | POA: Diagnosis present

## 2019-05-30 DIAGNOSIS — E1122 Type 2 diabetes mellitus with diabetic chronic kidney disease: Secondary | ICD-10-CM | POA: Diagnosis present

## 2019-05-30 DIAGNOSIS — Z79899 Other long term (current) drug therapy: Secondary | ICD-10-CM

## 2019-05-30 DIAGNOSIS — I25110 Atherosclerotic heart disease of native coronary artery with unstable angina pectoris: Secondary | ICD-10-CM | POA: Diagnosis present

## 2019-05-30 LAB — COMPREHENSIVE METABOLIC PANEL
ALT: 18 U/L (ref 0–44)
AST: 22 U/L (ref 15–41)
Albumin: 4.5 g/dL (ref 3.5–5.0)
Alkaline Phosphatase: 47 U/L (ref 38–126)
Anion gap: 12 (ref 5–15)
BUN: 12 mg/dL (ref 6–20)
CO2: 19 mmol/L — ABNORMAL LOW (ref 22–32)
Calcium: 9.5 mg/dL (ref 8.9–10.3)
Chloride: 105 mmol/L (ref 98–111)
Creatinine, Ser: 0.98 mg/dL (ref 0.61–1.24)
GFR calc Af Amer: >60 mL/min (ref >60)
GFR calc non Af Amer: >60 mL/min (ref >60)
Glucose, Bld: 113 mg/dL — ABNORMAL HIGH (ref 70–99)
Potassium: 3.7 mmol/L (ref 3.5–5.1)
Sodium: 136 mmol/L (ref 135–145)
Total Bilirubin: 0.8 mg/dL (ref 0.3–1.2)
Total Protein: 7.9 g/dL (ref 6.5–8.1)

## 2019-05-30 LAB — CBC WITH DIFFERENTIAL/PLATELET
Abs Immature Granulocytes: 0.02 10*3/uL (ref 0.00–0.07)
Basophils Absolute: 0 10*3/uL (ref 0.0–0.1)
Basophils Relative: 1 %
Eosinophils Absolute: 0.2 10*3/uL (ref 0.0–0.5)
Eosinophils Relative: 3 %
HCT: 41.8 % (ref 39.0–52.0)
Hemoglobin: 13.6 g/dL (ref 13.0–17.0)
Immature Granulocytes: 0 %
Lymphocytes Relative: 20 %
Lymphs Abs: 1.3 10*3/uL (ref 0.7–4.0)
MCH: 29.8 pg (ref 26.0–34.0)
MCHC: 32.5 g/dL (ref 30.0–36.0)
MCV: 91.5 fL (ref 80.0–100.0)
Monocytes Absolute: 0.7 10*3/uL (ref 0.1–1.0)
Monocytes Relative: 11 %
Neutro Abs: 4.3 10*3/uL (ref 1.7–7.7)
Neutrophils Relative %: 65 %
Platelets: 216 10*3/uL (ref 150–400)
RBC: 4.57 MIL/uL (ref 4.22–5.81)
RDW: 13.5 % (ref 11.5–15.5)
WBC: 6.5 10*3/uL (ref 4.0–10.5)
nRBC: 0 % (ref 0.0–0.2)

## 2019-05-30 LAB — HEMOGLOBIN A1C
Hgb A1c MFr Bld: 6.7 % — ABNORMAL HIGH (ref 4.8–5.6)
Mean Plasma Glucose: 145.59 mg/dL

## 2019-05-30 LAB — APTT: aPTT: 30 seconds (ref 24–36)

## 2019-05-30 LAB — LIPID PANEL
Cholesterol: 141 mg/dL (ref 0–200)
HDL: 49 mg/dL (ref >40)
LDL Cholesterol: 37 mg/dL (ref 0–99)
Total CHOL/HDL Ratio: 2.9 RATIO
Triglycerides: 277 mg/dL — ABNORMAL HIGH (ref <150)
VLDL: 55 mg/dL — ABNORMAL HIGH (ref 0–40)

## 2019-05-30 LAB — GLUCOSE, CAPILLARY
Glucose-Capillary: 114 mg/dL — ABNORMAL HIGH (ref 70–99)
Glucose-Capillary: 115 mg/dL — ABNORMAL HIGH (ref 70–99)
Glucose-Capillary: 189 mg/dL — ABNORMAL HIGH (ref 70–99)

## 2019-05-30 LAB — PROTIME-INR
INR: 1 (ref 0.8–1.2)
Prothrombin Time: 13.1 seconds (ref 11.4–15.2)

## 2019-05-30 LAB — HEPARIN LEVEL (UNFRACTIONATED)
Heparin Unfractionated: 0.67 IU/mL (ref 0.30–0.70)
Heparin Unfractionated: 0.35 IU/mL (ref 0.30–0.70)

## 2019-05-30 LAB — SARS CORONAVIRUS 2 (TAT 6-24 HRS): SARS Coronavirus 2: NEGATIVE

## 2019-05-30 LAB — T4, FREE: Free T4: 1.12 ng/dL (ref 0.61–1.12)

## 2019-05-30 LAB — TROPONIN I (HIGH SENSITIVITY)
Troponin I (High Sensitivity): 266 ng/L (ref <18)
Troponin I (High Sensitivity): 378 ng/L (ref <18)

## 2019-05-30 LAB — TSH: TSH: 1.603 u[IU]/mL (ref 0.350–4.500)

## 2019-05-30 LAB — CBG MONITORING, ED: Glucose-Capillary: 115 mg/dL — ABNORMAL HIGH (ref 70–99)

## 2019-05-30 LAB — HIV ANTIBODY (ROUTINE TESTING W REFLEX): HIV Screen 4th Generation wRfx: NONREACTIVE

## 2019-05-30 SURGERY — CORONARY/GRAFT ACUTE MI REVASCULARIZATION
Anesthesia: LOCAL

## 2019-05-30 MED ORDER — MAGNESIUM OXIDE 400 (241.3 MG) MG PO TABS
400.0000 mg | ORAL_TABLET | Freq: Two times a day (BID) | ORAL | Status: DC
  Administered 2019-05-30 – 2019-06-01 (×5): 400 mg via ORAL
  Filled 2019-05-30 (×5): qty 1

## 2019-05-30 MED ORDER — ONDANSETRON HCL 4 MG/2ML IJ SOLN: 4.0000 mg | Freq: Four times a day (QID) | INTRAMUSCULAR | Status: DC | PRN

## 2019-05-30 MED ORDER — TACROLIMUS 1 MG PO CAPS
3.0000 mg | ORAL_CAPSULE | Freq: Every day | ORAL | Status: DC
  Administered 2019-05-30 – 2019-06-01 (×3): 3 mg via ORAL
  Filled 2019-05-30 (×3): qty 3

## 2019-05-30 MED ORDER — MYCOPHENOLATE SODIUM 180 MG PO TBEC
720.0000 mg | DELAYED_RELEASE_TABLET | Freq: Two times a day (BID) | ORAL | Status: DC
  Administered 2019-05-30 – 2019-06-01 (×5): 720 mg via ORAL
  Filled 2019-05-30 (×6): qty 4

## 2019-05-30 MED ORDER — INSULIN ASPART 100 UNIT/ML ~~LOC~~ SOLN
0.0000 [IU] | Freq: Three times a day (TID) | SUBCUTANEOUS | Status: DC
  Administered 2019-05-31: 2 [IU] via SUBCUTANEOUS

## 2019-05-30 MED ORDER — RANOLAZINE ER 500 MG PO TB12
500.0000 mg | ORAL_TABLET | Freq: Two times a day (BID) | ORAL | Status: DC
  Administered 2019-05-30 – 2019-06-01 (×5): 500 mg via ORAL
  Filled 2019-05-30 (×6): qty 1

## 2019-05-30 MED ORDER — SODIUM CHLORIDE 0.9 % IV SOLN
INTRAVENOUS | Status: DC
  Administered 2019-05-30: 05:00:00 via INTRAVENOUS

## 2019-05-30 MED ORDER — LISINOPRIL 10 MG PO TABS
10.0000 mg | ORAL_TABLET | Freq: Every day | ORAL | Status: DC
  Administered 2019-05-30 – 2019-06-01 (×3): 10 mg via ORAL
  Filled 2019-05-30 (×3): qty 1

## 2019-05-30 MED ORDER — SODIUM BICARBONATE 650 MG PO TABS
1300.0000 mg | ORAL_TABLET | Freq: Two times a day (BID) | ORAL | Status: DC
  Administered 2019-05-30 – 2019-06-01 (×5): 1300 mg via ORAL
  Filled 2019-05-30 (×6): qty 2

## 2019-05-30 MED ORDER — CARVEDILOL 12.5 MG PO TABS
12.5000 mg | ORAL_TABLET | Freq: Two times a day (BID) | ORAL | Status: DC
  Administered 2019-05-30 (×2): 12.5 mg via ORAL
  Filled 2019-05-30 (×3): qty 1

## 2019-05-30 MED ORDER — ASPIRIN EC 81 MG PO TBEC
81.0000 mg | DELAYED_RELEASE_TABLET | Freq: Every day | ORAL | Status: DC
  Administered 2019-05-30 – 2019-06-01 (×3): 81 mg via ORAL
  Filled 2019-05-30 (×4): qty 1

## 2019-05-30 MED ORDER — PANTOPRAZOLE SODIUM 40 MG PO TBEC
40.0000 mg | DELAYED_RELEASE_TABLET | Freq: Every day | ORAL | Status: DC
  Administered 2019-05-30 – 2019-06-01 (×3): 40 mg via ORAL
  Filled 2019-05-30 (×3): qty 1

## 2019-05-30 MED ORDER — ROSUVASTATIN CALCIUM 20 MG PO TABS
40.0000 mg | ORAL_TABLET | Freq: Every day | ORAL | Status: DC
  Administered 2019-05-30 – 2019-05-31 (×2): 40 mg via ORAL
  Filled 2019-05-30 (×2): qty 2

## 2019-05-30 MED ORDER — NITROGLYCERIN 0.4 MG SL SUBL: 0.4000 mg | SUBLINGUAL_TABLET | SUBLINGUAL | Status: DC | PRN

## 2019-05-30 MED ORDER — NITROGLYCERIN IN D5W 200-5 MCG/ML-% IV SOLN
0.0000 ug/min | INTRAVENOUS | Status: DC
  Administered 2019-05-30: 06:00:00 5 ug/min via INTRAVENOUS
  Administered 2019-05-31: 15 ug/min via INTRAVENOUS
  Filled 2019-05-30 (×2): qty 250

## 2019-05-30 MED ORDER — HEPARIN SODIUM (PORCINE) 5000 UNIT/ML IJ SOLN: 4000.0000 [IU] | Freq: Once | INTRAMUSCULAR | Status: DC

## 2019-05-30 MED ORDER — TACROLIMUS 1 MG PO CAPS
2.0000 mg | ORAL_CAPSULE | Freq: Every day | ORAL | Status: DC
  Administered 2019-05-30 – 2019-05-31 (×2): 2 mg via ORAL
  Filled 2019-05-30 (×3): qty 2

## 2019-05-30 MED ORDER — PREDNISONE 5 MG PO TABS
5.0000 mg | ORAL_TABLET | Freq: Every day | ORAL | Status: DC
  Administered 2019-05-30 – 2019-06-01 (×3): 5 mg via ORAL
  Filled 2019-05-30 (×3): qty 1

## 2019-05-30 MED ORDER — TACROLIMUS 1 MG PO CAPS: 2.0000 mg | ORAL_CAPSULE | Freq: Two times a day (BID) | ORAL | Status: DC

## 2019-05-30 MED ORDER — ASPIRIN 81 MG PO CHEW: 324.0000 mg | CHEWABLE_TABLET | Freq: Once | ORAL | Status: DC

## 2019-05-30 MED ORDER — HEPARIN (PORCINE) 25000 UT/250ML-% IV SOLN
1200.0000 [IU]/h | INTRAVENOUS | Status: DC
  Administered 2019-05-30: 1100 [IU]/h via INTRAVENOUS
  Administered 2019-05-31 (×2): 1200 [IU]/h via INTRAVENOUS
  Filled 2019-05-30 (×3): qty 250

## 2019-05-30 NOTE — Progress Notes (Signed)
[place] for Heparin Indication: chest pain/ACS  Allergies  Allergen Reactions  . Percocet [Oxycodone-Acetaminophen] Hives and Itching  . Vicodin [Hydrocodone-Acetaminophen] Hives and Itching  . Shellfish Allergy Hives  . Tomato Hives    Patient Measurements: Height: 5\' 7"  (170.2 cm) Weight: 190 lb (86.2 kg) IBW/kg (Calculated) : 66.1 Heparin Dosing Weight: 83.7 kg  Vital Signs: Temp: 98 F (36.7 C) (08/29 1943) Temp Source: Oral (08/29 1943) BP: 97/77 (08/29 1943) Pulse Rate: 67 (08/29 1943)  Labs: Recent Labs    05/30/19 0424 05/30/19 0619 05/30/19 1154 05/30/19 1934  HGB 13.6  --   --   --   HCT 41.8  --   --   --   PLT 216  --   --   --   APTT 30  --   --   --   LABPROT 13.1  --   --   --   INR 1.0  --   --   --   HEPARINUNFRC  --   --  0.67 0.35  CREATININE 0.98  --   --   --   TROPONINIHS 266* 378*  --   --     Estimated Creatinine Clearance: 85.1 mL/min (by C-G formula based on SCr of 0.98 mg/dL).   Medical History: Past Medical History:  Diagnosis Date  . Anxiety   . Arthritis    "lower back" (11/28/2017)  . CAD (coronary artery disease)   . Chronic lower back pain   . H/O immunosuppressive therapy    chronic/notes 11/28/2017  . History of gout    "before kidney transport" (11/28/2017)  . Hyperlipidemia   . Hypertension   . Kidney disease    s/p kidney transplant 2011; "not on dialysis now" (11/28/2017)    Medications:  Infusions:  . sodium chloride 10 mL/hr at 05/30/19 0526  . heparin 1,100 Units/hr (05/30/19 0523)  . nitroGLYCERIN 20 mcg/min (05/30/19 0636)    Assessment: 63 YOM with chest pain. No AC noted PTA. Pharmacy consulted to dose heparin.  Heparin level therapeutic  Goal of Therapy:  Heparin level 0.3-0.7 units/ml Monitor platelets by anticoagulation protocol: Yes   Plan:  - Increase heparin to 1200 units / hr to prevent drop  - Daily HL, CBC - Monitor for bleeding  Thank  you Anette Guarneri, PharmD 05/30/2019  8:05 PM  Please check [email or web address] for unit-specific pharmacy phone numbers.

## 2019-05-30 NOTE — Progress Notes (Signed)
   Patient seen briefly this morning - see complete fellow H&P from this morning. Troponin mildly elevated at 266 -> 378, on heparin. COVID test pending. Nursing staff was concerned about "bite marks" on both arms - I have evaluated and it appears likely that these are healing bedbug bites - he said he was staying with a friend in less than sanitary conditions. He denies pruritis - no active bugs noted. Counciled about prevention.  Pixie Casino, MD, [facility] [place] [facility], [place] Director of the Advanced Lipid Disorders &  Cardiovascular Risk Reduction Clinic Diplomate of the American Board of Clinical Lipidology Attending Cardiologist  Direct Dial: [phone]  Fax: [phone]  Website:  [email or web address]

## 2019-05-30 NOTE — Progress Notes (Signed)
[place] for Heparin Indication: chest pain/ACS  Allergies  Allergen Reactions  . Percocet [Oxycodone-Acetaminophen] Hives and Itching  . Vicodin [Hydrocodone-Acetaminophen] Hives and Itching  . Shellfish Allergy Hives  . Tomato Hives    Patient Measurements: Height: 5\' 7"  (170.2 cm) Weight: 190 lb (86.2 kg) IBW/kg (Calculated) : 66.1 Heparin Dosing Weight: 83.7 kg  Vital Signs: Temp: 96.8 F (36 C) (08/29 0419) Temp Source: Temporal (08/29 0419) BP: 155/99 (08/29 0917) Pulse Rate: 79 (08/29 0917)  Labs: Recent Labs    05/30/19 0424 05/30/19 0619 05/30/19 1154  HGB 13.6  --   --   HCT 41.8  --   --   PLT 216  --   --   APTT 30  --   --   LABPROT 13.1  --   --   INR 1.0  --   --   HEPARINUNFRC  --   --  0.67  CREATININE 0.98  --   --   TROPONINIHS 266* 378*  --     Estimated Creatinine Clearance: 85.1 mL/min (by C-G formula based on SCr of 0.98 mg/dL).   Medical History: Past Medical History:  Diagnosis Date  . Anxiety   . Arthritis    "lower back" (11/28/2017)  . CAD (coronary artery disease)   . Chronic lower back pain   . H/O immunosuppressive therapy    chronic/notes 11/28/2017  . History of gout    "before kidney transport" (11/28/2017)  . Hyperlipidemia   . Hypertension   . Kidney disease    s/p kidney transplant 2011; "not on dialysis now" (11/28/2017)    Medications:  Infusions:  . sodium chloride 10 mL/hr at 05/30/19 0526  . heparin 1,100 Units/hr (05/30/19 0523)  . nitroGLYCERIN 20 mcg/min (05/30/19 0636)    Assessment: 37 YOM with chest pain. No AC noted PTA. Pharmacy consulted to dose heparin.  First heparin level therapeutic at 0.67 after 4000 unit bolus and being on drip rate 1100 units/hr. CBC WNL No bleeding or infusion issues noted. Will continue current drip rate.   Goal of Therapy:  Heparin level 0.3-0.7 units/ml Monitor platelets by anticoagulation protocol: Yes   Plan:  - Continue  heparin 1100 units/hr - 6hr heparin level - Daily HL, CBC - Monitor for bleeding  Richardine Service, PharmD PGY1 Pharmacy Resident Phone: [phone] 05/30/2019  1:16 PM  Please check [email or web address] for unit-specific pharmacy phone numbers.

## 2019-05-30 NOTE — ED Notes (Signed)
Attempted additional iv site without success

## 2019-05-30 NOTE — ED Notes (Signed)
Cancel STEMI at this time

## 2019-05-30 NOTE — ED Notes (Signed)
QNS unsuccessful blood draw,  Other phleb will try

## 2019-05-30 NOTE — H&P (Signed)
Cardiology H&P    Patient ID: Theodore Arellano MRN: [id number], DOB/AGE: [birth year]   Admit date: 05/30/2019 Date of Consult: 05/30/2019  Primary Physician: [name], MD Primary Cardiologist: No primary care provider on file.  Patient Profile    Theodore Arellano is a 58 y.o. male with a history of hypertension, hyperlipidemia, diabetes, severe three-vessel coronary disease, end-stage renal disease status post transplant in 2011, who presents due to chest pain.  Past Medical History   Past Medical History:  Diagnosis Date  . Anxiety   . Arthritis    "lower back" (11/28/2017)  . CAD (coronary artery disease)   . Chronic lower back pain   . H/O immunosuppressive therapy    chronic/notes 11/28/2017  . History of gout    "before kidney transport" (11/28/2017)  . Hyperlipidemia   . Hypertension   . Kidney disease    s/p kidney transplant 2011; "not on dialysis now" (11/28/2017)    Past Surgical History:  Procedure Laterality Date  . ARTERIOVENOUS GRAFT PLACEMENT Right 04/2007   Archie Endo 02/01/2011  . AV FISTULA PLACEMENT Left 12/20/2005; 12/2006   Archie Endo 5/15/2012Marland Kitchen Archie Endo 02/13/2011  . CARDIAC CATHETERIZATION    . HD access procedures    . INGUINAL HERNIA REPAIR Left   . KIDNEY TRANSPLANT  2011  . LEFT HEART CATH AND CORONARY ANGIOGRAPHY N/A 11/29/2017   Procedure: LEFT HEART CATH AND CORONARY ANGIOGRAPHY;  Surgeon: Leonie Man, MD;  Location: [place] [facility];  Service: Cardiovascular;  Laterality: N/A;  . LEFT HEART CATHETERIZATION WITH CORONARY ANGIOGRAM  03/11/2012   Procedure: LEFT HEART CATHETERIZATION WITH CORONARY ANGIOGRAM;  Surgeon: Jolaine Artist, MD;  Location: [facility];  Service: Cardiovascular;;  . RIGHT HEART CATHETERIZATION N/A 03/11/2012   Procedure: RIGHT HEART CATH;  Surgeon: Jolaine Artist, MD;  Location: [facility] CATH LAB;  Service: Cardiovascular;  Laterality: N/A;  . THROMBECTOMY Right 12/2007   Archie Endo 02/01/2011  . THROMBECTOMY / ARTERIOVENOUS  GRAFT REVISION Left 12/2006   Archie Endo 02/13/2011  . THROMBECTOMY / ARTERIOVENOUS GRAFT REVISION Right 07/2007; 10/2007;01/2008;   Archie Endo 01/31/2011; Archie Endo 02/01/2011; Archie Endo 01/31/2011  . THROMBECTOMY AND REVISION OF ARTERIOVENTOUS (AV) GORETEX  GRAFT  03/2007 X 2   /notes 02/01/2011     Allergies  Allergies  Allergen Reactions  . Percocet [Oxycodone-Acetaminophen] Hives and Itching  . Vicodin [Hydrocodone-Acetaminophen] Hives and Itching  . Shellfish Allergy Hives  . Tomato Hives    History of Present Illness    Theodore Arellano is a 58 year old gentleman with hypertension, hyperlipidemia, diabetes, end-stage renal disease status post renal transplant 2011, known three-vessel coronary artery disease who presented as a STEMI activation in the setting of chest pain.  The patient reports that following his March 2019 hospitalization his chest pain initially abated but over the last several months has been increasing in frequency.  It is now happening on a daily basis.  He had a severe episode tonight which is why he called EMS.  There was concern for inferior ST elevation and thus code STEMI was activated.  EMS was called for his chest pain this evening and he initially had 10 out of 10 pain but received 2 sublingual nitroglycerin.  By the time he arrived to the emergency room he now reports that he is chest pain-free.  Notably left heart cath from March 2019 showed a 100% occluded proximal LAD, 100 Percocet occluded proximal RCA both of these vessels fill via collaterals distally.  Also with a 95% ramus, 99%  OM lesions.  Discussion at that time was that patient was not a surgical candidate for CABG and that given heavily calcified significant coronary multivessel coronary disease treatment with PCI would require a significant dye load.  In the setting of his renal transplant, decision was made for medical management of coronary artery disease.  Following the patient's initial evaluation, he initially remained  chest pain-free.  However, he did develop recurrent severe, substernal chest pressure.  And was started on a nitroglycerin infusion in the ED.  Repeat EKG at that time showed isolated 1 mm ST elevation in lead III.  No elevation in leads II or aVF.  Inpatient Medications      Scheduled Meds: Continuous Infusions: . sodium chloride 10 mL/hr at 05/30/19 0526  . heparin 1,100 Units/hr (05/30/19 0523)  . nitroGLYCERIN 10 mcg/min (05/30/19 0605)   PRN Meds:.   Family History    Family History  Problem Relation Age of Onset  . Hypertension Father   . Diabetes Mother    He indicated that his mother is alive. He indicated that his father is deceased.   Social History    Social History   Socioeconomic History  . Marital status: Single    Spouse name: Not on file  . Number of children: Not on file  . Years of education: Not on file  . Highest education level: Not on file  Occupational History  . 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 Financial resource strain: Not on file  . Food insecurity    Worry: Not on file    Inability: Not on file  . Transportation needs    Medical: Not on file    Non-medical: Not on file  Tobacco Use  . Smoking status: Current Every Day Smoker    Packs/day: 0.50    Years: 40.00    Pack years: 20.00    Types: Cigarettes  . Smokeless tobacco: Never Used  Substance and Sexual Activity  . Alcohol use: Yes    Alcohol/week: 12.0 standard drinks    Types: 12 Cans of beer per week  . Drug use: No  . Sexual activity: Never    Birth control/protection: None  Lifestyle  . Physical activity    Days per week: Not on file    Minutes per session: Not on file  . Stress: Not on file  Relationships  . Social Herbalist on phone: Not on file    Gets together: Not on file    Attends religious service: Not on file    Active member of club or organization: Not on file    Attends meetings of clubs or organizations: Not on file     Relationship status: Not on file  . Intimate partner violence    Fear of current or ex partner: Not on file    Emotionally abused: Not on file    Physically abused: Not on file    Forced sexual activity: Not on file  Other Topics Concern  . Not on file  Social History Narrative  . Not on file     Review of Systems    General:  No chills, fever, night sweats or weight changes.  Cardiovascular: As per HPI Dermatological: No rash, lesions/masses Respiratory: No cough, dyspnea Urologic: No hematuria, dysuria Abdominal:   No nausea, vomiting, diarrhea, bright red blood per rectum, melena, or hematemesis Neurologic:  No visual changes, wkns, changes in mental status. All other systems reviewed and are otherwise  negative except as noted above.  Physical Exam    Blood pressure (!) 171/102, pulse 81, temperature (!) 96.8 F (36 C), temperature source Temporal, resp. rate 20, height 5\' 7"  (1.702 m), weight 86.2 kg, SpO2 92 %.  General: Pleasant, appears chronically ill Psych: Normal affect. Neuro: Alert and oriented X 3. Moves all extremities spontaneously. HEENT: Normal  Lungs:  Resp regular and unlabored Heart: RRR no s3, s4, or murmurs. Extremities: No clubbing, cyanosis or edema.   Labs    High Sensitivity Troponin Recent Labs  Lab 05/30/19 0424  TROPONINIHS 266*     Cardiac Enzymes No results for input(s): TROPONINI in the last 168 hours. No results for input(s): TROPIPOC in the last 168 hours.   Lab Results  Component Value Date   WBC 6.5 05/30/2019   HGB 13.6 05/30/2019   HCT 41.8 05/30/2019   MCV 91.5 05/30/2019   PLT 216 05/30/2019    Recent Labs  Lab 05/30/19 0424  NA 136  K 3.7  CL 105  CO2 19*  BUN 12  CREATININE 0.98  CALCIUM 9.5  PROT 7.9  BILITOT 0.8  ALKPHOS 47  ALT 18  AST 22  GLUCOSE 113*   Lab Results  Component Value Date   CHOL 141 05/30/2019   HDL 49 05/30/2019   LDLCALC 37 05/30/2019   TRIG 277 (H) 05/30/2019   Lab Results   Component Value Date   DDIMER (H) 02/18/2009    1.05        AT THE INHOUSE ESTABLISHED CUTOFF VALUE OF 0.48 ug/mL FEU, THIS ASSAY HAS BEEN DOCUMENTED IN THE LITERATURE TO HAVE A SENSITIVITY AND NEGATIVE PREDICTIVE VALUE OF AT LEAST 98 TO 99%.  THE TEST RESULT SHOULD BE CORRELATED WITH AN ASSESSMENT OF THE CLINICAL PROBABILITY OF DVT / VTE.     Radiology Studies    Dg Chest Portable 1 View  Result Date: 05/30/2019 CLINICAL DATA:  Chest pain EXAM: PORTABLE CHEST 1 VIEW COMPARISON:  11/27/2017 FINDINGS: The heart size and mediastinal contours are within normal limits. Both lungs are clear. The visualized skeletal structures are unremarkable. IMPRESSION: No active disease. Electronically Signed   By: Ulyses Jarred M.D.   On: 05/30/2019 04:28    ECG & Cardiac Imaging    ECG shows inferior Q waves with borderline ST changes in 3 and aVF.  These improved between the EMS EKG and that upon arrival in the ED.  Does have new T wave inversion and mild ST depression in lateral leads- personally reviewed.  Repeat EKG as above with isolated 1 mm ST elevation in lead III.  Cath 11/29/2017  Ost LAD to Prox LAD lesion is 80% stenosed. Prox LAD lesion is 100% stenosed. -- distal LAD partially filled via L-L/Bridging collaterals: Mid LAD to Dist LAD lesion is 90% stenosed.  Bifurcating ramus intermedius: Ost Ramus lesion is 95% stenosed. Ramus lesion is 70% stenosed. Lat Ramus lesion is 70% stenosed.  Bifurcating OM1: Ost 1st Mrg lesion is 99% stenosed with 99% stenosed side branch in Lat 1st Mrg. Ost 1st Mrg to 1st Mrg lesion is 100% stenosed.  Mid Cx to Dist Cx lesion is 75% stenosed. The distal circumflex faintly collateralizes the right posterior lateral system  Previously 100% occluded. RCA - Prox RCA lesion is 100% stenosed. -Partial recanalization and bridging collaterals faintly fill the vessel  Prox RCA to Mid RCA lesion is 90% stenosed. Mid RCA to Dist RCA lesion is 70% stenosed.  Post  Atrio lesion is 100%  stenosed. - L-R collaterals do not fill to RPDA  TTE 11/28/17 Left ventricle: The cavity size was normal. There was mild   concentric hypertrophy. Systolic function was moderately reduced.   The estimated ejection fraction was in the range of 35% to 40%.   Dyskinesis of the apical myocardium. Akinesis and scarring of the   mid-apical inferior and inferoseptal myocardium; consistent with   ischemia in the distribution of the right coronary artery or a   large &quot;wrap-around&quot; LAD artery. Severe hypokinesis of the   apicalanteroseptal and anterior myocardium; consistent with   ischemia in the distribution of the left anterior descending   coronary artery. Doppler parameters are consistent with abnormal   left ventricular relaxation (grade 1 diastolic dysfunction).   Acoustic contrast opacification revealed no evidence ofthrombus. - Aortic valve: There was mild regurgitation. - Mitral valve: There was mild regurgitation.  Impressions: - Compared to November 2017, there are extensive new wall motion   abnormalities and a substantial reduction in left ventricular   systolic function.  Assessment & Plan    Mr. Kamel is a 58 year old gentleman with hypertension, hyperlipidemia, diabetes, end-stage renal disease status post renal transplant 2011, known three-vessel coronary artery disease who presented as a STEMI activation in the setting of chest pain.  He initially had resolution of chest pain but has had recurrence while in the ED.  EKG does show some dynamic changes although do not meet criteria for STEMI as he does not have ST elevation in contiguous leads.  # Severe 3v CAD Patient with severe coronary artery disease as above per cath 11/29/2017.  100% proximal LAD with bridging collaterals and 100% proximal RCA.  Also with significant circumflex disease.  It is not surprising that the patient comes in with recurrent chest pain, however, has been escalating in the  last months and is now occurring multiple times a day. Initial EKG and clinical scenario discussed with Dr. Gwenlyn Found - canceled code STEMI given that patient was chest pain-free and with minimal change in EKG from prior on repeat in the ED. Subsequently, patient reported intermittent severe chest pain. He does have some dynamic EKG changes but still does not meet STEMI criteria.  At minimum the patient has unstable angina and with trending troponin will determine if he rules in for NSTEMI. Previous discussions during the patient's admission approximately 1 year ago was that treatment of his complex, heavily calcified coronary disease would require significant contrast in the Cath Lab.  Discussion regarding the risk and benefit given his history of renal transplant occurred.  Plan at that time was for medical management of coronary disease.  He was not felt to be a bypass candidate previously due to poor targets and concern for high likelihood of early graft thrombosis due to outflow issues. -Trend troponins initial troponin is elevated at 266. -Echocardiogram this morning -Heparin drip for ACS -Was loaded with aspirin, continue aspirin 81 mg daily -Continue home Coreg -Given recurrent chest pain will start nitroglycerin infusion -Continue home Ranexa -Continue home Crestor -Hold home Imdur, sublingual nitroglycerin while receiving nitro gtt. -N.p.o. for now, consideration of heart catheterization today if patient chest pain cannot be managed with nitroglycerin drip.    #Heart failure reduced ejection fraction Patient with reduced ejection fraction on echo during his last admission in 2019.  EF estimated at 35 to 40%.  He appears euvolemic on examination today.  Given multivessel coronary disease suspect this is ischemic in etiology -Continue Coreg and lisinopril as above -Management of  coronary disease as above -Repeat TTE today  #End-stage renal disease status post transplant in 2011 -Will need  renal consult today to follow along in the setting of known renal transplant -We will get tachycardia level this morning -Continue home prednisone, tacrolimus, mycophenolate  #Hypertension -Hold home amlodipine in order to allow for blood pressure for nitro gtt -Continue home lisinopril  #DMII -Hold home pioglitazone -Sliding scale insulin  # NPO  # FULL CODE  Signed, Bryna Colander, MD 05/30/2019, 6:28 AM  For questions or updates, please contact   Please consult [email or web address] for contact info under Cardiology/STEMI.

## 2019-05-30 NOTE — ED Provider Notes (Signed)
[facility] EMERGENCY DEPARTMENT Provider Note   CSN: [id number] Arrival date & time: 05/30/19  6834     History   Chief Complaint Chief Complaint  Patient presents with  . Chest Pain  Level 5 caveat due to acuity of condition  HPI Theodore Arellano is a 58 y.o. male.     The history is provided by the patient and the EMS personnel.  Chest Pain Pain location:  Substernal area Pain quality: pressure   Pain severity:  Severe Onset quality:  Sudden Timing:  Constant Progression:  Improving Chronicity:  New Relieved by:  Nitroglycerin and aspirin Worsened by:  Nothing Associated symptoms: shortness of breath   Patient with extensive history including hyperlipidemia, hypertension, renal disease s/p kidney transplant 2011, CAD with extensive multivessel disease Presents with chest pain.  He reports frequent episodes of chest pain, this is started approximately 2 hours prior to arrival.  It was very severe, EMS was called, he was given nitro and aspirin.  Code STEMI was activated in the field Past Medical History:  Diagnosis Date  . Anxiety   . Arthritis    "lower back" (11/28/2017)  . CAD (coronary artery disease)   . Chronic lower back pain   . H/O immunosuppressive therapy    chronic/notes 11/28/2017  . History of gout    "before kidney transport" (11/28/2017)  . Hyperlipidemia   . Hypertension   . Kidney disease    s/p kidney transplant 2011; "not on dialysis now" (11/28/2017)    Patient Active Problem List   Diagnosis Date Noted  . Coronary artery disease involving native coronary artery of native heart with unstable angina pectoris ([place])   . Coronary artery disease due to lipid rich plaque   . Elevated troponin   . Chest pain 08/27/2016  . Renal transplant recipient 08/27/2016  . Essential hypertension 08/27/2016  . HLD (hyperlipidemia) 08/27/2016  . Type 2 diabetes mellitus with vascular disease ([place]) 08/27/2016  . Left groin pain 06/24/2013    Past Surgical History:  Procedure Laterality Date  . ARTERIOVENOUS GRAFT PLACEMENT Right 04/2007   Archie Endo 02/01/2011  . AV FISTULA PLACEMENT Left 12/20/2005; 12/2006   Archie Endo 5/15/2012Marland Kitchen Archie Endo 02/13/2011  . CARDIAC CATHETERIZATION    . HD access procedures    . INGUINAL HERNIA REPAIR Left   . KIDNEY TRANSPLANT  2011  . LEFT HEART CATH AND CORONARY ANGIOGRAPHY N/A 11/29/2017   Procedure: LEFT HEART CATH AND CORONARY ANGIOGRAPHY;  Surgeon: Leonie Man, MD;  Location: [facility];  Service: Cardiovascular;  Laterality: N/A;  . LEFT HEART CATHETERIZATION WITH CORONARY ANGIOGRAM  03/11/2012   Procedure: LEFT HEART CATHETERIZATION WITH CORONARY ANGIOGRAM;  Surgeon: Jolaine Artist, MD;  Location: [facility] CATH LAB;  Service: Cardiovascular;;  . RIGHT HEART CATHETERIZATION N/A 03/11/2012   Procedure: RIGHT HEART CATH;  Surgeon: Jolaine Artist, MD;  Location: [facility] CATH LAB;  Service: Cardiovascular;  Laterality: N/A;  . THROMBECTOMY Right 12/2007   Archie Endo 02/01/2011  . THROMBECTOMY / ARTERIOVENOUS GRAFT REVISION Left 12/2006   Archie Endo 02/13/2011  . THROMBECTOMY / ARTERIOVENOUS GRAFT REVISION Right 07/2007; 10/2007;01/2008;   Archie Endo 01/31/2011; Archie Endo 02/01/2011; Archie Endo 01/31/2011  . THROMBECTOMY AND REVISION OF ARTERIOVENTOUS (AV) GORETEX  GRAFT  03/2007 X 2   /notes 02/01/2011        Home Medications    Prior to Admission medications   Medication Sig Start Date End Date Taking? Authorizing Provider  amLODipine (NORVASC) 10 MG tablet Take 10 mg by  mouth daily.     [provider]  aspirin EC 81 MG tablet Take 81 mg by mouth daily.    [provider]  budesonide (PULMICORT) 180 MCG/ACT inhaler Inhale 2 puffs into the lungs 2 (two) times daily as needed (wheezing).     [provider]  carvedilol (COREG) 25 MG tablet  10/14/16   [provider]  colchicine 0.6 MG tablet Take 0.6 mg by mouth daily as needed (for gout).     [provider]  isosorbide  mononitrate (IMDUR) 30 MG 24 hr tablet Take 1 tablet (30 mg total) by mouth daily. 12/03/17   Elwyn Reach, MD  lisinopril (PRINIVIL,ZESTRIL) 10 MG tablet Take 1 tablet (10 mg total) by mouth daily. 08/28/16   Geradine Girt, DO  magnesium oxide (MAG-OX) 400 MG tablet Take 400 mg by mouth 2 (two) times daily.     [provider]  mycophenolate (MYFORTIC) 360 MG TBEC Take 720 mg by mouth 2 (two) times daily.    [provider]  NITROSTAT 0.4 MG SL tablet Place 0.4 mg under the tongue every 5 (five) minutes as needed for chest pain.  07/27/14   [provider]  omeprazole (PRILOSEC) 20 MG capsule Take 20 mg by mouth daily.    [provider]  pioglitazone (ACTOS) 15 MG tablet Take 15 mg by mouth daily as needed (for sugar).     [provider]  predniSONE (DELTASONE) 5 MG tablet Take 5 mg by mouth every morning.     [provider]  ranolazine (RANEXA) 500 MG 12 hr tablet Take 500 mg by mouth 2 (two) times daily.    [provider]  rosuvastatin (CRESTOR) 40 MG tablet Take 1 tablet (40 mg total) by mouth daily at 6 PM. 12/02/17   Elwyn Reach, MD  sodium bicarbonate 650 MG tablet Take 1,300 mg by mouth 2 (two) times daily.    [provider]  tacrolimus (PROGRAF) 1 MG capsule Take 2-3 mg by mouth 2 (two) times daily. Take 3 mg every AM and 2 mg every PM    [provider]  traMADol (ULTRAM) 50 MG tablet Take 50-100 mg by mouth every 4 (four) hours as needed for pain. 05/29/16   [provider]  triamcinolone cream (KENALOG) 0.1 % Apply 1 application topically as needed (for skin).  05/12/14   [provider]    Family History Family History  Problem Relation Age of Onset  . Hypertension Father   . Diabetes Mother     Social History Social History   Tobacco Use  . Smoking status: Current Every Day Smoker    Packs/day: 0.50    Years: 40.00    Pack years: 20.00    Types: Cigarettes  .  Smokeless tobacco: Never Used  Substance Use Topics  . Alcohol use: Yes    Alcohol/week: 12.0 standard drinks    Types: 12 Cans of beer per week  . Drug use: No     Allergies   Percocet [oxycodone-acetaminophen], Vicodin [hydrocodone-acetaminophen], Shellfish allergy, and Tomato   Review of Systems Review of Systems  Unable to perform ROS: Acuity of condition  Respiratory: Positive for shortness of breath.   Cardiovascular: Positive for chest pain.     Physical Exam Updated Vital Signs BP (!) 150/95 (BP Location: Left Arm)   Pulse 80   Temp (!) 96.8 F (36 C) (Temporal)   Resp 20   Ht 1.702 m (5'  7")   Wt 86.2 kg   BMI 29.76 kg/m   Physical Exam  CONSTITUTIONAL: Elderly, appears older than stated age, chronically ill-appearing HEAD: Normocephalic/atraumatic EYES: EOMI/PERRL ENMT: Mask in place NECK: supple no meningeal signs SPINE/BACK:entire spine nontender CV: S1/S2 noted, no murmurs/rubs/gallops noted LUNGS: Lungs are clear to auscultation bilaterally, no apparent distress ABDOMEN: soft, nontender, no rebound or guarding, bowel sounds noted throughout abdomen GU:no cva tenderness NEURO: Pt is awake/alert/appropriate, moves all extremitiesx4.  No facial droop.   EXTREMITIES: pulses normal/equal, full ROM SKIN: warm, color normal PSYCH: no abnormalities of mood noted, alert and oriented to situation  ED Treatments / Results  Labs (all labs ordered are listed, but only abnormal results are displayed) Labs Reviewed  COMPREHENSIVE METABOLIC PANEL - Abnormal; Notable for the following components:      Result Value   CO2 19 (*)    Glucose, Bld 113 (*)    All other components within normal limits  LIPID PANEL - Abnormal; Notable for the following components:   Triglycerides 277 (*)    VLDL 55 (*)    All other components within normal limits  TROPONIN I (HIGH SENSITIVITY) - Abnormal; Notable for the following components:   Troponin I (High Sensitivity) 266  (*)    All other components within normal limits  SARS CORONAVIRUS 2 (TAT 6-12 HRS)  CBC WITH DIFFERENTIAL/PLATELET  PROTIME-INR  APTT  HEPARIN LEVEL (UNFRACTIONATED)  TROPONIN I (HIGH SENSITIVITY)    EKG EKG Interpretation  Date/Time:  Saturday May 30 2019 04:17:55 EDT Ventricular Rate:  81 PR Interval:    QRS Duration: 92 QT Interval:  387 QTC Calculation: 450 R Axis:     Text Interpretation:  Sinus rhythm Prolonged PR interval Probable left atrial enlargement Inferior infarct, old Anterolateral infarct, age indeterminate Interpretation limited secondary to artifact Confirmed by Ripley Fraise ([id number]) on 05/30/2019 4:25:53 AM   Radiology Dg Chest Portable 1 View  Result Date: 05/30/2019 CLINICAL DATA:  Chest pain EXAM: PORTABLE CHEST 1 VIEW COMPARISON:  11/27/2017 FINDINGS: The heart size and mediastinal contours are within normal limits. Both lungs are clear. The visualized skeletal structures are unremarkable. IMPRESSION: No active disease. Electronically Signed   By: Ulyses Jarred M.D.   On: 05/30/2019 04:28    Procedures .Critical Care Performed by: Ripley Fraise, MD Authorized by: Ripley Fraise, MD   Critical care provider statement:    Critical care time (minutes):  60   Critical care start time:  05/30/2019 5:00 AM   Critical care end time:  05/30/2019 6:00 AM   Critical care time was exclusive of:  Separately billable procedures and treating other patients   Critical care was necessary to treat or prevent imminent or life-threatening deterioration of the following conditions:  Cardiac failure and circulatory failure   Critical care was time spent personally by me on the following activities:  Discussions with consultants, evaluation of patient's response to treatment, examination of patient, re-evaluation of patient's condition, review of old charts, pulse oximetry, ordering and review of radiographic studies, ordering and review of laboratory studies and  ordering and performing treatments and interventions   I assumed direction of critical care for this patient from another provider in my specialty: no      Medications Ordered in ED Medications  0.9 %  sodium chloride infusion ( Intravenous New Bag/Given 05/30/19 0526)  heparin ADULT infusion 100 units/mL (25000 units/251mL sodium chloride 0.45%) (1,100 Units/hr Intravenous New Bag/Given 05/30/19 0523)  nitroGLYCERIN 50 mg in dextrose 5 %  250 mL (0.2 mg/mL) infusion (10 mcg/min Intravenous Rate/Dose Change 05/30/19 0605)     Initial Impression / Assessment and Plan / ED Course  I have reviewed the triage vital signs and the nursing notes.  Pertinent labs & imaging results that were available during my care of the patient were reviewed by me and considered in my medical decision making (see chart for details).        5:00 AM Patient seen on arrival for possible STEMI. Patient has extensive cardiac history including extensive multivessel disease.  Initial prehospital EKG was concerning for STEMI, but on arrival repeat EKG is improved.  Discussed the case with Dr. Georgette Shell cardiology who is in consultation with Dr. Gwenlyn Found They recommend canceling code STEMI. However we can proceed with a ACS work-up and start heparin.  Patient does report his chest pain is improving.  Vital signs are appropriate this time.  Work-up is pending 6:25 AM Patient had return of pain Repeat EKG performed  EKG Interpretation  Date/Time:  Saturday May 30 2019 06:00:41 EDT Ventricular Rate:  79 PR Interval:    QRS Duration: 96 QT Interval:  387 QTC Calculation: 444 R Axis:   14 Text Interpretation:  Sinus rhythm Prolonged PR interval Probable left atrial enlargement Abnormal R-wave progression, early transition LVH with secondary repolarization abnormality Abnormal inferior Q waves Minimal ST elevation, inferior leads Abnormal ekg Confirmed by Ripley Fraise [phone]) on 05/30/2019 6:09:21 AM       Patient with isolated elevation in lead III. Reviewed EKG with cardiology fellow Dr. Georgette Shell Plan is to continue heparin and nitroglycerin, will be admitted to the cardiology service. At this point he would not be activated as a code STEMI Plan to continue to monitor. Final Clinical Impressions(s) / ED Diagnoses   Final diagnoses:  None    ED Discharge Orders    None       Ripley Fraise, MD 05/30/19 [phone]

## 2019-05-30 NOTE — ED Notes (Signed)
ED TO INPATIENT HANDOFF REPORT  ED Nurse Name and Phone #: maggie [phone]  S Name/Age/Gender Theodore Arellano 58 y.o. male Room/Bed: TRAAC/TRAAC  Code Status   Code Status: Full Code  Home/SNF/Other Home Patient oriented to: self, place, time and situation Is this baseline? Yes   Triage Complete: Triage complete  Chief Complaint Stemi  Triage Note Pt arrives via [facility] ems, pt has been having on and off chest pain for about 2 months. Onset of CP around 0230 this morning. Pain 10/10, given 2 nitro, pain reduced to 5/10. STEMI activated in the field. 324 ASA given. Pain radiates to the left neck area. IV established en route.    Allergies Allergies  Allergen Reactions  . Percocet [Oxycodone-Acetaminophen] Hives and Itching  . Vicodin [Hydrocodone-Acetaminophen] Hives and Itching  . Shellfish Allergy Hives  . Tomato Hives    Level of Care/Admitting Diagnosis ED Disposition    ED Disposition Condition Comment   Admit  Hospital Area: [facility] [100100]  Level of Care: Progressive [102]  Covid Evaluation: Asymptomatic Screening Protocol (No Symptoms)  Diagnosis: Unstable angina pectoris due to coronary arteriosclerosis [facility]) [[id number]]  Admitting Physician: Bryna Colander [phone]  Attending Physician: Jerline Pain [[id number]]  Estimated length of stay: past midnight tomorrow  Certification:: I certify this patient will need inpatient services for at least 2 midnights  PT Class (Do Not Modify): Inpatient [101]  PT Acc Code (Do Not Modify): Private [1]       B Medical/Surgery History Past Medical History:  Diagnosis Date  . Anxiety   . Arthritis    "lower back" (11/28/2017)  . CAD (coronary artery disease)   . Chronic lower back pain   . H/O immunosuppressive therapy    chronic/notes 11/28/2017  . History of gout    "before kidney transport" (11/28/2017)  . Hyperlipidemia   . Hypertension   . Kidney disease    s/p kidney transplant 2011;  "not on dialysis now" (11/28/2017)   Past Surgical History:  Procedure Laterality Date  . ARTERIOVENOUS GRAFT PLACEMENT Right 04/2007   Archie Endo 02/01/2011  . AV FISTULA PLACEMENT Left 12/20/2005; 12/2006   Archie Endo 5/15/2012Marland Kitchen Archie Endo 02/13/2011  . CARDIAC CATHETERIZATION    . HD access procedures    . INGUINAL HERNIA REPAIR Left   . KIDNEY TRANSPLANT  2011  . LEFT HEART CATH AND CORONARY ANGIOGRAPHY N/A 11/29/2017   Procedure: LEFT HEART CATH AND CORONARY ANGIOGRAPHY;  Surgeon: Leonie Man, MD;  Location: [place] CV LAB;  Service: Cardiovascular;  Laterality: N/A;  . LEFT HEART CATHETERIZATION WITH CORONARY ANGIOGRAM  03/11/2012   Procedure: LEFT HEART CATHETERIZATION WITH CORONARY ANGIOGRAM;  Surgeon: Jolaine Artist, MD;  Location: [facility] CATH LAB;  Service: Cardiovascular;;  . RIGHT HEART CATHETERIZATION N/A 03/11/2012   Procedure: RIGHT HEART CATH;  Surgeon: Jolaine Artist, MD;  Location: [facility] CATH LAB;  Service: Cardiovascular;  Laterality: N/A;  . THROMBECTOMY Right 12/2007   Archie Endo 02/01/2011  . THROMBECTOMY / ARTERIOVENOUS GRAFT REVISION Left 12/2006   Archie Endo 02/13/2011  . THROMBECTOMY / ARTERIOVENOUS GRAFT REVISION Right 07/2007; 10/2007;01/2008;   Archie Endo 01/31/2011; Archie Endo 02/01/2011; Archie Endo 01/31/2011  . THROMBECTOMY AND REVISION OF ARTERIOVENTOUS (AV) GORETEX  GRAFT  03/2007 X 2   /notes 02/01/2011     A IV Location/Drains/Wounds Patient Lines/Drains/Airways Status   Active Line/Drains/Airways    Name:   Placement date:   Placement time:   Site:   Days:   Peripheral IV 05/30/19 Left Hand  05/30/19    0453    Hand   less than 1   Fistula / Graft Right Upper arm   11/28/17    1900    Upper arm   548          Intake/Output Last 24 hours  Intake/Output Summary (Last 24 hours) at 05/30/2019 0714 Last data filed at 05/30/2019 0636 Gross per 24 hour  Intake 2.66 ml  Output -  Net 2.66 ml    Labs/Imaging Results for orders placed or performed during the hospital encounter of  05/30/19 (from the past 48 hour(s))  CBC with Differential/Platelet     Status: None   Collection Time: 05/30/19  4:24 AM  Result Value Ref Range   WBC 6.5 4.0 - 10.5 K/uL   RBC 4.57 4.22 - 5.81 MIL/uL   Hemoglobin 13.6 13.0 - 17.0 g/dL   HCT 41.8 39.0 - 52.0 %   MCV 91.5 80.0 - 100.0 fL   MCH 29.8 26.0 - 34.0 pg   MCHC 32.5 30.0 - 36.0 g/dL   RDW 13.5 11.5 - 15.5 %   Platelets 216 150 - 400 K/uL   nRBC 0.0 0.0 - 0.2 %   Neutrophils Relative % 65 %   Neutro Abs 4.3 1.7 - 7.7 K/uL   Lymphocytes Relative 20 %   Lymphs Abs 1.3 0.7 - 4.0 K/uL   Monocytes Relative 11 %   Monocytes Absolute 0.7 0.1 - 1.0 K/uL   Eosinophils Relative 3 %   Eosinophils Absolute 0.2 0.0 - 0.5 K/uL   Basophils Relative 1 %   Basophils Absolute 0.0 0.0 - 0.1 K/uL   Immature Granulocytes 0 %   Abs Immature Granulocytes 0.02 0.00 - 0.07 K/uL    Comment: Performed at [facility], [address], [place], [place] [address]  Protime-INR     Status: None   Collection Time: 05/30/19  4:24 AM  Result Value Ref Range   Prothrombin Time 13.1 11.4 - 15.2 seconds   INR 1.0 0.8 - 1.2    Comment: (NOTE) INR goal varies based on device and disease states. Performed at [facility], [place] [address], [place], [place] [address]   APTT     Status: None   Collection Time: 05/30/19  4:24 AM  Result Value Ref Range   aPTT 30 24 - 36 seconds    Comment: Performed at [facility] [address] [place], [place] [address]  Comprehensive metabolic panel     Status: Abnormal   Collection Time: 05/30/19  4:24 AM  Result Value Ref Range   Sodium 136 135 - 145 mmol/L   Potassium 3.7 3.5 - 5.1 mmol/L   Chloride 105 98 - 111 mmol/L   CO2 19 (L) 22 - 32 mmol/L   Glucose, Bld 113 (H) 70 - 99 mg/dL   BUN 12 6 - 20 mg/dL   Creatinine, Ser 0.98 0.61 - 1.24 mg/dL   Calcium 9.5 8.9 - 10.3 mg/dL   Total Protein 7.9 6.5 - 8.1 g/dL   Albumin 4.5 3.5 - 5.0 g/dL   AST 22 15 - 41 U/L   ALT 18 0 - 44 U/L    Alkaline Phosphatase 47 38 - 126 U/L   Total Bilirubin 0.8 0.3 - 1.2 mg/dL   GFR calc non Af Amer >60 >60 mL/min   GFR calc Af Amer >60 >60 mL/min   Anion gap 12 5 - 15    Comment: Performed  at [facility], [place] [address] [place], [place] [address]  Troponin I (High Sensitivity)     Status: Abnormal   Collection Time: 05/30/19  4:24 AM  Result Value Ref Range   Troponin I (High Sensitivity) 266 (HH) <18 ng/L    Comment: CRITICAL RESULT CALLED TO, READ BACK BY AND VERIFIED WITH: Neldon Labella 05/30/19 0529 WAYK Performed at [facility] [address] [place], [place] [address]   Lipid panel     Status: Abnormal   Collection Time: 05/30/19  4:24 AM  Result Value Ref Range   Cholesterol 141 0 - 200 mg/dL   Triglycerides 277 (H) <150 mg/dL   HDL 49 >40 mg/dL   Total CHOL/HDL Ratio 2.9 RATIO   VLDL 55 (H) 0 - 40 mg/dL   LDL Cholesterol 37 0 - 99 mg/dL    Comment:        Total Cholesterol/HDL:CHD Risk Coronary Heart Disease Risk Table                     Men   Women  1/2 Average Risk   3.4   3.3  Average Risk       5.0   4.4  2 X Average Risk   9.6   7.1  3 X Average Risk  23.4   11.0        Use the calculated Patient Ratio above and the CHD Risk Table to determine the patient's CHD Risk.        ATP III CLASSIFICATION (LDL):  <100     mg/dL   Optimal  100-129  mg/dL   Near or Above                    Optimal  130-159  mg/dL   Borderline  160-189  mg/dL   High  >190     mg/dL   Very High Performed at [facility] [address] [place],  [address]    Dg Chest Portable 1 View  Result Date: 05/30/2019 CLINICAL DATA:  Chest pain EXAM: PORTABLE CHEST 1 VIEW COMPARISON:  11/27/2017 FINDINGS: The heart size and mediastinal contours are within normal limits. Both lungs are clear. The visualized skeletal structures are unremarkable. IMPRESSION: No active disease. Electronically Signed   By: Ulyses Jarred M.D.   On: 05/30/2019 04:28    Pending  Labs Unresulted Labs (From admission, onward)    Start     Ordered   05/31/19 0500  Heparin level (unfractionated)  Daily,   R     05/30/19 0431   05/31/19 0500  CBC  Daily,   R     05/30/19 0431   05/30/19 1200  Heparin level (unfractionated)  Once-Timed,   STAT     05/30/19 0431   05/30/19 0640  TSH  Once,   STAT     05/30/19 0641   05/30/19 0640  T4, free  Once,   STAT     05/30/19 0641   05/30/19 0640  Hemoglobin A1c  Once,   STAT     05/30/19 0641   05/30/19 0636  Tacrolimus level  Once,   STAT     05/30/19 0641   05/30/19 0636  HIV antibody (Routine Testing)  Once,   STAT     05/30/19 0641   05/30/19 0425  SARS CORONAVIRUS 2 (TAT 6-12 HRS) Nasal Swab Aptima Multi Swab  (Asymptomatic/Tier 2 Patients Labs)  Once,  STAT    Question Answer Comment  Is this test for diagnosis or screening Screening   Symptomatic for COVID-19 as defined by CDC No   Hospitalized for COVID-19 No   Admitted to ICU for COVID-19 No   Previously tested for COVID-19 No   Resident in a congregate (group) care setting No   Employed in healthcare setting No      05/30/19 0424          Vitals/Pain Today's Vitals   05/30/19 0640 05/30/19 0650 05/30/19 0700 05/30/19 0704  BP: (!) 144/96 (!) 151/95 (!) 151/98   Pulse: 77 74 81   Resp: 15 17 16    Temp:      TempSrc:      SpO2: 91% 92% 94%   Weight:      Height:      PainSc:    2     Isolation Precautions No active isolations  Medications Medications  0.9 %  sodium chloride infusion ( Intravenous New Bag/Given 05/30/19 0526)  heparin ADULT infusion 100 units/mL (25000 units/258mL sodium chloride 0.45%) (1,100 Units/hr Intravenous New Bag/Given 05/30/19 0523)  nitroGLYCERIN 50 mg in dextrose 5 % 250 mL (0.2 mg/mL) infusion (20 mcg/min Intravenous Rate/Dose Verify 05/30/19 0636)  aspirin EC tablet 81 mg (has no administration in time range)  carvedilol (COREG) tablet 12.5 mg (has no administration in time range)  lisinopril (ZESTRIL) tablet 10  mg (has no administration in time range)  ranolazine (RANEXA) 12 hr tablet 500 mg (has no administration in time range)  rosuvastatin (CRESTOR) tablet 40 mg (has no administration in time range)  predniSONE (DELTASONE) tablet 5 mg (has no administration in time range)  magnesium oxide (MAG-OX) tablet 400 mg (has no administration in time range)  pantoprazole (PROTONIX) EC tablet 40 mg (has no administration in time range)  sodium bicarbonate tablet 1,300 mg (has no administration in time range)  mycophenolate (MYFORTIC) EC tablet 720 mg (has no administration in time range)  tacrolimus (PROGRAF) capsule 2-3 mg (has no administration in time range)  nitroGLYCERIN (NITROSTAT) SL tablet 0.4 mg (has no administration in time range)  ondansetron (ZOFRAN) injection 4 mg (has no administration in time range)  insulin aspart (novoLOG) injection 0-15 Units (has no administration in time range)    Mobility walks Low fall risk   Focused Assessments Cardiac Assessment Handoff:  Cardiac Rhythm: Normal sinus rhythm Lab Results  Component Value Date   CKTOTAL 150 02/18/2009   CKMB 3.5 02/18/2009   TROPONINI 0.06 (HH) 11/28/2017   Lab Results  Component Value Date   DDIMER (H) 02/18/2009    1.05        AT THE INHOUSE ESTABLISHED CUTOFF VALUE OF 0.48 ug/mL FEU, THIS ASSAY HAS BEEN DOCUMENTED IN THE LITERATURE TO HAVE A SENSITIVITY AND NEGATIVE PREDICTIVE VALUE OF AT LEAST 98 TO 99%.  THE TEST RESULT SHOULD BE CORRELATED WITH AN ASSESSMENT OF THE CLINICAL PROBABILITY OF DVT / VTE.   Does the Patient currently have chest pain? Yes     R Recommendations: See Admitting Provider Note  Report given to:   Additional Notes: NSTEMI

## 2019-05-30 NOTE — Progress Notes (Signed)
ANTICOAGULATION CONSULT NOTE - Initial Consult  Pharmacy Consult for Heparin Indication: chest pain/ACS  Allergies  Allergen Reactions  . Percocet [Oxycodone-Acetaminophen] Hives and Itching  . Vicodin [Hydrocodone-Acetaminophen] Hives and Itching  . Shellfish Allergy Hives  . Tomato Hives    Patient Measurements: Height: 5\' 7"  (170.2 cm) Weight: 190 lb (86.2 kg) IBW/kg (Calculated) : 66.1  Vital Signs: Temp: 96.8 F (36 C) (08/29 0419) Temp Source: Temporal (08/29 0419) BP: 150/95 (08/29 0419) Pulse Rate: 80 (08/29 0419)  Labs: No results for input(s): HGB, HCT, PLT, APTT, LABPROT, INR, HEPARINUNFRC, HEPRLOWMOCWT, CREATININE, CKTOTAL, CKMB, TROPONINIHS in the last 72 hours.  CrCl cannot be calculated (Patient's most recent lab result is older than the maximum 21 days allowed.).   Medical History: Past Medical History:  Diagnosis Date  . Anxiety   . Arthritis    "lower back" (11/28/2017)  . CAD (coronary artery disease)   . Chronic lower back pain   . H/O immunosuppressive therapy    chronic/notes 11/28/2017  . History of gout    "before kidney transport" (11/28/2017)  . Hyperlipidemia   . Hypertension   . Kidney disease    s/p kidney transplant 2011; "not on dialysis now" (11/28/2017)    Medications:  No current facility-administered medications on file prior to encounter.    Current Outpatient Medications on File Prior to Encounter  Medication Sig Dispense Refill  . amLODipine (NORVASC) 10 MG tablet Take 10 mg by mouth daily.     Marland Kitchen aspirin EC 81 MG tablet Take 81 mg by mouth daily.    . budesonide (PULMICORT) 180 MCG/ACT inhaler Inhale 2 puffs into the lungs 2 (two) times daily as needed (wheezing).     . carvedilol (COREG) 25 MG tablet     . colchicine 0.6 MG tablet Take 0.6 mg by mouth daily as needed (for gout).     . isosorbide mononitrate (IMDUR) 30 MG 24 hr tablet Take 1 tablet (30 mg total) by mouth daily. 30 tablet 0  . lisinopril (PRINIVIL,ZESTRIL)  10 MG tablet Take 1 tablet (10 mg total) by mouth daily. 30 tablet 0  . magnesium oxide (MAG-OX) 400 MG tablet Take 400 mg by mouth 2 (two) times daily.     . mycophenolate (MYFORTIC) 360 MG TBEC Take 720 mg by mouth 2 (two) times daily.    Marland Kitchen NITROSTAT 0.4 MG SL tablet Place 0.4 mg under the tongue every 5 (five) minutes as needed for chest pain.   1  . omeprazole (PRILOSEC) 20 MG capsule Take 20 mg by mouth daily.    . pioglitazone (ACTOS) 15 MG tablet Take 15 mg by mouth daily as needed (for sugar).     . predniSONE (DELTASONE) 5 MG tablet Take 5 mg by mouth every morning.     . ranolazine (RANEXA) 500 MG 12 hr tablet Take 500 mg by mouth 2 (two) times daily.    . rosuvastatin (CRESTOR) 40 MG tablet Take 1 tablet (40 mg total) by mouth daily at 6 PM. 30 tablet 0  . sodium bicarbonate 650 MG tablet Take 1,300 mg by mouth 2 (two) times daily.    . tacrolimus (PROGRAF) 1 MG capsule Take 2-3 mg by mouth 2 (two) times daily. Take 3 mg every AM and 2 mg every PM    . traMADol (ULTRAM) 50 MG tablet Take 50-100 mg by mouth every 4 (four) hours as needed for pain.    Marland Kitchen triamcinolone cream (KENALOG) 0.1 % Apply 1 application topically  as needed (for skin).   0     Assessment: 58 y.o. male with chest pain for heparin.    Goal of Therapy:  Heparin level 0.3-0.7 units/ml Monitor platelets by anticoagulation protocol: Yes   Plan:  Heparin 4000 units IV bolus, then start heparin 1100 units/hr Check heparin level in 8 hours.   Kyelle Urbas, Bronson Curb 05/30/2019,4:29 AM

## 2019-05-30 NOTE — Consult Note (Deleted)
Cardiology Consult    Patient ID: Theodore Arellano MRN: [id number], DOB/AGE: 59-08-1961   Admit date: 05/30/2019 Date of Consult: 05/30/2019  Primary Physician: [name], MD Primary Cardiologist: No primary care provider on file. Requesting Provider:   Patient Profile    Theodore Arellano is a 58 y.o. male with a history of hypertension, hyperlipidemia, diabetes, severe three-vessel coronary disease, end-stage renal disease status post transplant in 2011, who is being seen today for the evaluation of chest pain.  Past Medical History   Past Medical History:  Diagnosis Date  . Anxiety   . Arthritis    "lower back" (11/28/2017)  . CAD (coronary artery disease)   . Chronic lower back pain   . H/O immunosuppressive therapy    chronic/notes 11/28/2017  . History of gout    "before kidney transport" (11/28/2017)  . Hyperlipidemia   . Hypertension   . Kidney disease    s/p kidney transplant 2011; "not on dialysis now" (11/28/2017)    Past Surgical History:  Procedure Laterality Date  . ARTERIOVENOUS GRAFT PLACEMENT Right 04/2007   Archie Endo 02/01/2011  . AV FISTULA PLACEMENT Left 12/20/2005; 12/2006   Archie Endo 5/15/2012Marland Kitchen Archie Endo 02/13/2011  . CARDIAC CATHETERIZATION    . HD access procedures    . INGUINAL HERNIA REPAIR Left   . KIDNEY TRANSPLANT  2011  . LEFT HEART CATH AND CORONARY ANGIOGRAPHY N/A 11/29/2017   Procedure: LEFT HEART CATH AND CORONARY ANGIOGRAPHY;  Surgeon: Leonie Man, MD;  Location: [facility];  Service: Cardiovascular;  Laterality: N/A;  . LEFT HEART CATHETERIZATION WITH CORONARY ANGIOGRAM  03/11/2012   Procedure: LEFT HEART CATHETERIZATION WITH CORONARY ANGIOGRAM;  Surgeon: Jolaine Artist, MD;  Location: [facility] CATH LAB;  Service: Cardiovascular;;  . RIGHT HEART CATHETERIZATION N/A 03/11/2012   Procedure: RIGHT HEART CATH;  Surgeon: Jolaine Artist, MD;  Location: [facility] CATH LAB;  Service: Cardiovascular;  Laterality: N/A;  . THROMBECTOMY Right 12/2007    Archie Endo 02/01/2011  . THROMBECTOMY / ARTERIOVENOUS GRAFT REVISION Left 12/2006   Archie Endo 02/13/2011  . THROMBECTOMY / ARTERIOVENOUS GRAFT REVISION Right 07/2007; 10/2007;01/2008;   Archie Endo 01/31/2011; Archie Endo 02/01/2011; Archie Endo 01/31/2011  . THROMBECTOMY AND REVISION OF ARTERIOVENTOUS (AV) GORETEX  GRAFT  03/2007 X 2   /notes 02/01/2011     Allergies  Allergies  Allergen Reactions  . Percocet [Oxycodone-Acetaminophen] Hives and Itching  . Vicodin [Hydrocodone-Acetaminophen] Hives and Itching  . Shellfish Allergy Hives  . Tomato Hives    History of Present Illness    Theodore Arellano is a 58 year old gentleman with hypertension, hyperlipidemia, diabetes, end-stage renal disease status post renal transplant 2011, known three-vessel coronary artery disease who presented as a STEMI activation in the setting of chest pain.  The patient reports that following his March 2019 hospitalization his chest pain initially abated but over the last several months has been increasing in frequency.  It is now happening on a daily basis.  He had a severe episode tonight which is why he called EMS.  There was concern for inferior ST elevation and thus code STEMI was activated.  EMS was called for his chest pain this evening and he initially had 10 out of 10 pain but received 2 sublingual nitroglycerin.  By the time he arrived to the emergency room he now reports that he is chest pain-free.  Notably left heart cath from March 2019 showed a 100% occluded proximal LAD, 100 Percocet occluded proximal RCA both of these vessels fill via collaterals  distally.  Also with a 95% ramus, 99% OM lesions.  Discussion at that time was that patient was not a surgical candidate for CABG and that given heavily calcified significant coronary multivessel coronary disease treatment with PCI would require a significant dye load.  In the setting of his renal transplant, decision was made for medical management of coronary artery disease.   Inpatient  Medications      Family History    Family History  Problem Relation Age of Onset  . Hypertension Father   . Diabetes Mother    He indicated that his mother is alive. He indicated that his father is deceased.   Social History    Social History   Socioeconomic History  . Marital status: Single    Spouse name: Not on file  . Number of children: Not on file  . Years of education: Not on file  . Highest education level: Not on file  Occupational History  . 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 Financial resource strain: Not on file  . Food insecurity    Worry: Not on file    Inability: Not on file  . Transportation needs    Medical: Not on file    Non-medical: Not on file  Tobacco Use  . Smoking status: Current Every Day Smoker    Packs/day: 0.50    Years: 40.00    Pack years: 20.00    Types: Cigarettes  . Smokeless tobacco: Never Used  Substance and Sexual Activity  . Alcohol use: Yes    Alcohol/week: 12.0 standard drinks    Types: 12 Cans of beer per week  . Drug use: No  . Sexual activity: Never    Birth control/protection: None  Lifestyle  . Physical activity    Days per week: Not on file    Minutes per session: Not on file  . Stress: Not on file  Relationships  . Social Herbalist on phone: Not on file    Gets together: Not on file    Attends religious service: Not on file    Active member of club or organization: Not on file    Attends meetings of clubs or organizations: Not on file    Relationship status: Not on file  . Intimate partner violence    Fear of current or ex partner: Not on file    Emotionally abused: Not on file    Physically abused: Not on file    Forced sexual activity: Not on file  Other Topics Concern  . Not on file  Social History Narrative  . Not on file     Review of Systems    General:  No chills, fever, night sweats or weight changes.  Cardiovascular: As per HPI Dermatological: No rash,  lesions/masses Respiratory: No cough, dyspnea Urologic: No hematuria, dysuria Abdominal:   No nausea, vomiting, diarrhea, bright red blood per rectum, melena, or hematemesis Neurologic:  No visual changes, wkns, changes in mental status. All other systems reviewed and are otherwise negative except as noted above.  Physical Exam    Blood pressure (!) 150/95, pulse 80, temperature (!) 96.8 F (36 C), temperature source Temporal, resp. rate 20, height 5\' 7"  (1.702 m), weight 86.2 kg.  General: Pleasant, appears chronically ill Psych: Normal affect. Neuro: Alert and oriented X 3. Moves all extremities spontaneously. HEENT: Normal  Lungs:  Resp regular and unlabored Heart: RRR no s3, s4, or murmurs. Extremities: No clubbing, cyanosis or edema.  Labs    High Sensitivity Troponin No results for input(s): TROPONINIHS in the last 720 hours.   Cardiac Enzymes No results for input(s): TROPONINI in the last 168 hours. No results for input(s): TROPIPOC in the last 168 hours.   Lab Results  Component Value Date   WBC 5.3 12/02/2017   HGB 13.4 12/02/2017   HCT 40.5 12/02/2017   MCV 90.0 12/02/2017   PLT 184 12/02/2017   No results for input(s): NA, K, CL, CO2, BUN, CREATININE, CALCIUM, PROT, BILITOT, ALKPHOS, ALT, AST, GLUCOSE in the last 168 hours.  Invalid input(s): LABALBU Lab Results  Component Value Date   CHOL (H) 02/18/2009    205        ATP III CLASSIFICATION:  <200     mg/dL   Desirable  200-239  mg/dL   Borderline High  >=240    mg/dL   High          HDL 33 (L) 02/18/2009   LDLCALC  02/18/2009    UNABLE TO CALCULATE IF TRIGLYCERIDE OVER 400 mg/dL        Total Cholesterol/HDL:CHD Risk Coronary Heart Disease Risk Table                     Men   Women  1/2 Average Risk   3.4   3.3  Average Risk       5.0   4.4  2 X Average Risk   9.6   7.1  3 X Average Risk  23.4   11.0        Use the calculated Patient Ratio above and the CHD Risk Table to determine the patient's  CHD Risk.        ATP III CLASSIFICATION (LDL):  <100     mg/dL   Optimal  100-129  mg/dL   Near or Above                    Optimal  130-159  mg/dL   Borderline  160-189  mg/dL   High  >190     mg/dL   Very High   TRIG 456 (H) 02/18/2009   Lab Results  Component Value Date   DDIMER (H) 02/18/2009    1.05        AT THE INHOUSE ESTABLISHED CUTOFF VALUE OF 0.48 ug/mL FEU, THIS ASSAY HAS BEEN DOCUMENTED IN THE LITERATURE TO HAVE A SENSITIVITY AND NEGATIVE PREDICTIVE VALUE OF AT LEAST 98 TO 99%.  THE TEST RESULT SHOULD BE CORRELATED WITH AN ASSESSMENT OF THE CLINICAL PROBABILITY OF DVT / VTE.     Radiology Studies    Dg Chest Portable 1 View  Result Date: 05/30/2019 CLINICAL DATA:  Chest pain EXAM: PORTABLE CHEST 1 VIEW COMPARISON:  11/27/2017 FINDINGS: The heart size and mediastinal contours are within normal limits. Both lungs are clear. The visualized skeletal structures are unremarkable. IMPRESSION: No active disease. Electronically Signed   By: Ulyses Jarred M.D.   On: 05/30/2019 04:28    ECG & Cardiac Imaging    ECG shows inferior Q waves with borderline ST changes in 3 and aVF.  These improved between the EMS EKG and that upon arrival in the ED.  Does have new T wave inversion and mild ST depression in lateral leads- personally reviewed.  Assessment & Plan    Theodore Arellano is a 58 year old gentleman with hypertension, hyperlipidemia, diabetes, end-stage renal disease status post renal transplant 2011, known three-vessel coronary artery disease who  presented as a STEMI activation in the setting of chest pain.  -EKG and clinical scenario discussed with Dr. Gwenlyn Found - cancel code STEMI at this time given patient is chest pain-free and with minimal change in EKG from prior on repeat in the ED - Would initiate heparin drip for unstable angina versus NSTEMI - Trend troponins - Echocardiogram in the morning - If recurrent chest pain would initiate nitro drip for treatment - Admit  to medicine, we will continue to follow along for evaluation of his chest pain  Full consult to follow in the morning  Signed, Bryna Colander, MD 05/30/2019, 4:34 AM  For questions or updates, please contact   Please consult [email or web address] for contact info under Cardiology/STEMI.

## 2019-05-30 NOTE — ED Triage Notes (Signed)
Pt arrives via [place] ems, pt has been having on and off chest pain for about 2 months. Onset of CP around 0230 this morning. Pain 10/10, given 2 nitro, pain reduced to 5/10. STEMI activated in the field. 324 ASA given. Pain radiates to the left neck area. IV established en route.

## 2019-05-31 ENCOUNTER — Inpatient Hospital Stay (HOSPITAL_COMMUNITY): Payer: Medicare Other

## 2019-05-31 DIAGNOSIS — R0789 Other chest pain: Secondary | ICD-10-CM

## 2019-05-31 LAB — CBC
HCT: 40.9 % (ref 39.0–52.0)
Hemoglobin: 13.6 g/dL (ref 13.0–17.0)
MCH: 29.8 pg (ref 26.0–34.0)
MCHC: 33.3 g/dL (ref 30.0–36.0)
MCV: 89.7 fL (ref 80.0–100.0)
Platelets: 214 10*3/uL (ref 150–400)
RBC: 4.56 MIL/uL (ref 4.22–5.81)
RDW: 13.3 % (ref 11.5–15.5)
WBC: 6.4 10*3/uL (ref 4.0–10.5)
nRBC: 0 % (ref 0.0–0.2)

## 2019-05-31 LAB — BASIC METABOLIC PANEL
Anion gap: 12 (ref 5–15)
BUN: 17 mg/dL (ref 6–20)
CO2: 20 mmol/L — ABNORMAL LOW (ref 22–32)
Calcium: 9.1 mg/dL (ref 8.9–10.3)
Chloride: 106 mmol/L (ref 98–111)
Creatinine, Ser: 1.29 mg/dL — ABNORMAL HIGH (ref 0.61–1.24)
GFR calc Af Amer: >60 mL/min (ref >60)
GFR calc non Af Amer: >60 mL/min (ref >60)
Glucose, Bld: 114 mg/dL — ABNORMAL HIGH (ref 70–99)
Potassium: 4.1 mmol/L (ref 3.5–5.1)
Sodium: 138 mmol/L (ref 135–145)

## 2019-05-31 LAB — ECHOCARDIOGRAM COMPLETE
Height: 67 in
Weight: 2956.8 oz

## 2019-05-31 LAB — GLUCOSE, CAPILLARY
Glucose-Capillary: 102 mg/dL — ABNORMAL HIGH (ref 70–99)
Glucose-Capillary: 106 mg/dL — ABNORMAL HIGH (ref 70–99)
Glucose-Capillary: 136 mg/dL — ABNORMAL HIGH (ref 70–99)
Glucose-Capillary: 116 mg/dL — ABNORMAL HIGH (ref 70–99)

## 2019-05-31 LAB — HEPARIN LEVEL (UNFRACTIONATED): Heparin Unfractionated: 0.55 IU/mL (ref 0.30–0.70)

## 2019-05-31 MED ORDER — SODIUM CHLORIDE 0.9 % IV SOLN
INTRAVENOUS | Status: DC
  Administered 2019-05-31: 20:00:00 via INTRAVENOUS

## 2019-05-31 MED ORDER — SODIUM CHLORIDE 0.9% FLUSH: 3.0000 mL | INTRAVENOUS | Status: DC | PRN

## 2019-05-31 MED ORDER — CARVEDILOL 6.25 MG PO TABS
6.2500 mg | ORAL_TABLET | Freq: Two times a day (BID) | ORAL | Status: DC
  Administered 2019-05-31 – 2019-06-01 (×3): 6.25 mg via ORAL
  Filled 2019-05-31 (×2): qty 1

## 2019-05-31 MED ORDER — SODIUM CHLORIDE 0.9% FLUSH
3.0000 mL | Freq: Two times a day (BID) | INTRAVENOUS | Status: DC
  Administered 2019-06-01: 10:00:00 3 mL via INTRAVENOUS

## 2019-05-31 MED ORDER — SODIUM CHLORIDE 0.9 % IV SOLN: 250.0000 mL | INTRAVENOUS | Status: DC | PRN

## 2019-05-31 MED ORDER — POLYETHYLENE GLYCOL 3350 17 G PO PACK
17.0000 g | PACK | Freq: Every day | ORAL | Status: DC
  Administered 2019-05-31 – 2019-06-01 (×2): 17 g via ORAL
  Filled 2019-05-31: qty 1

## 2019-05-31 NOTE — Progress Notes (Signed)
  Echocardiogram 2D Echocardiogram has been performed.  Johny Chess 05/31/2019, 11:07 AM

## 2019-05-31 NOTE — Progress Notes (Addendum)
[place] for Heparin Indication: chest pain/ACS  Allergies  Allergen Reactions  . Percocet [Oxycodone-Acetaminophen] Hives and Itching  . Vicodin [Hydrocodone-Acetaminophen] Hives and Itching  . Shellfish Allergy Hives  . Tomato Hives    Patient Measurements: Height: 5\' 7"  (170.2 cm) Weight: 184 lb 12.8 oz (83.8 kg) IBW/kg (Calculated) : 66.1 Heparin Dosing Weight: 83.7 kg  Vital Signs: Temp: 98 F (36.7 C) (08/30 0516) Temp Source: Oral (08/30 0516) BP: 135/89 (08/30 0500) Pulse Rate: 59 (08/30 0500)  Labs: Recent Labs    05/30/19 0424 05/30/19 0619 05/30/19 1154 05/30/19 1934 05/31/19 0413  HGB 13.6  --   --   --  13.6  HCT 41.8  --   --   --  40.9  PLT 216  --   --   --  214  APTT 30  --   --   --   --   LABPROT 13.1  --   --   --   --   INR 1.0  --   --   --   --   HEPARINUNFRC  --   --  0.67 0.35 0.55  CREATININE 0.98  --   --   --  1.29*  TROPONINIHS 266* 378*  --   --   --     Estimated Creatinine Clearance: 63.8 mL/min (A) (by C-G formula based on SCr of 1.29 mg/dL (H)).   Medical History: Past Medical History:  Diagnosis Date  . Anxiety   . Arthritis    "lower back" (11/28/2017)  . CAD (coronary artery disease)   . Chronic lower back pain   . H/O immunosuppressive therapy    chronic/notes 11/28/2017  . History of gout    "before kidney transport" (11/28/2017)  . Hyperlipidemia   . Hypertension   . Kidney disease    s/p kidney transplant 2011; "not on dialysis now" (11/28/2017)    Medications:  Infusions:  . sodium chloride 10 mL/hr at 05/30/19 0526  . heparin 1,200 Units/hr (05/31/19 0230)  . nitroGLYCERIN 15 mcg/min (05/31/19 0110)    Assessment: 45 YOM with chest pain. No AC noted PTA. Pharmacy consulted to dose heparin.  Heparin level therapeutic at 0.55. Hgb 13.6, Plts 214.  Goal of Therapy:  Heparin level 0.3-0.7 units/ml Monitor platelets by anticoagulation protocol: Yes   Plan:  -  Continue heparin 1200 units/hr - Daily HL, CBC - Monitor for bleeding  Lorel Monaco, PharmD PGY1 Ambulatory Care Resident [facility] # [phone]  Please check [email or web address] for unit-specific pharmacy phone numbers.

## 2019-05-31 NOTE — H&P (View-Only) (Signed)
DAILY PROGRESS NOTE   Patient Name: Theodore Arellano Date of Encounter: 05/31/2019 Cardiologist: No primary care provider on file.  Chief Complaint   No chest pain  Patient Profile   Theodore Arellano is a 58 y.o. male with a history of hypertension, hyperlipidemia, diabetes, severe three-vessel coronary disease, end-stage renal disease status post transplant in 2011, who presents due to chest pain.  Subjective   No chest pain - vitals stable overnight. Creatinine up today at 1.29. COVID negative. [place] level pending. Plan for [facility] tomorrow. Telemetry shows some 2nd degree type 2 AVB this morning for several intervals.  Objective   Vitals:   05/31/19 0445 05/31/19 0500 05/31/19 0516 05/31/19 0800  BP: (!) 138/95 135/89  131/83  Pulse: 63 (!) 59  60  Resp: 18 14  16   Temp:   98 F (36.7 C)   TempSrc:   Oral   SpO2: 100% 99%  98%  Weight:   83.8 kg   Height:        Intake/Output Summary (Last 24 hours) at 05/31/2019 0858 Last data filed at 05/31/2019 0500 Gross per 24 hour  Intake 513.7 ml  Output -  Net 513.7 ml   Filed Weights   05/30/19 0417 05/31/19 0516  Weight: 86.2 kg 83.8 kg    Physical Exam   General appearance: alert and no distress Lungs: clear to auscultation bilaterally Heart: regular rate and rhythm, S1, S2 normal, no murmur, click, rub or gallop Extremities: extremities normal, atraumatic, no cyanosis or edema Neurologic: Grossly normal  Inpatient Medications    Scheduled Meds: . aspirin EC  81 mg Oral Daily  . carvedilol  12.5 mg Oral BID WC  . insulin aspart  0-15 Units Subcutaneous TID WC  . lisinopril  10 mg Oral Daily  . magnesium oxide  400 mg Oral BID  . mycophenolate  720 mg Oral BID  . pantoprazole  40 mg Oral Daily  . predniSONE  5 mg Oral Q breakfast  . ranolazine  500 mg Oral BID  . rosuvastatin  40 mg Oral q1800  . sodium bicarbonate  1,300 mg Oral BID  . tacrolimus  3 mg Oral Daily   And  . tacrolimus  2 mg Oral QHS     Continuous Infusions: . sodium chloride 10 mL/hr at 05/30/19 0526  . heparin 1,200 Units/hr (05/31/19 0230)  . nitroGLYCERIN 15 mcg/min (05/31/19 0110)    PRN Meds: nitroGLYCERIN, ondansetron (ZOFRAN) IV   Labs   Results for orders placed or performed during the hospital encounter of 05/30/19 (from the past 48 hour(s))  CBC with Differential/Platelet     Status: None   Collection Time: 05/30/19  4:24 AM  Result Value Ref Range   WBC 6.5 4.0 - 10.5 K/uL   RBC 4.57 4.22 - 5.81 MIL/uL   Hemoglobin 13.6 13.0 - 17.0 g/dL   HCT 41.8 39.0 - 52.0 %   MCV 91.5 80.0 - 100.0 fL   MCH 29.8 26.0 - 34.0 pg   MCHC 32.5 30.0 - 36.0 g/dL   RDW 13.5 11.5 - 15.5 %   Platelets 216 150 - 400 K/uL   nRBC 0.0 0.0 - 0.2 %   Neutrophils Relative % 65 %   Neutro Abs 4.3 1.7 - 7.7 K/uL   Lymphocytes Relative 20 %   Lymphs Abs 1.3 0.7 - 4.0 K/uL   Monocytes Relative 11 %   Monocytes Absolute 0.7 0.1 - 1.0 K/uL   Eosinophils Relative 3 %  Eosinophils Absolute 0.2 0.0 - 0.5 K/uL   Basophils Relative 1 %   Basophils Absolute 0.0 0.0 - 0.1 K/uL   Immature Granulocytes 0 %   Abs Immature Granulocytes 0.02 0.00 - 0.07 K/uL    Comment: Performed at [facility], [place] [address], [place], [place] [address]  Protime-INR     Status: None   Collection Time: 05/30/19  4:24 AM  Result Value Ref Range   Prothrombin Time 13.1 11.4 - 15.2 seconds   INR 1.0 0.8 - 1.2    Comment: (NOTE) INR goal varies based on device and disease states. Performed at [facility], [place] [address], [place], [place] [address]   APTT     Status: None   Collection Time: 05/30/19  4:24 AM  Result Value Ref Range   aPTT 30 24 - 36 seconds    Comment: Performed at [facility] [address], [place], [place] [address]  Comprehensive metabolic panel     Status: Abnormal   Collection Time: 05/30/19  4:24 AM  Result Value Ref Range   Sodium 136 135 - 145 mmol/L   Potassium 3.7 3.5 - 5.1 mmol/L   Chloride  105 98 - 111 mmol/L   CO2 19 (L) 22 - 32 mmol/L   Glucose, Bld 113 (H) 70 - 99 mg/dL   BUN 12 6 - 20 mg/dL   Creatinine, Ser 0.98 0.61 - 1.24 mg/dL   Calcium 9.5 8.9 - 10.3 mg/dL   Total Protein 7.9 6.5 - 8.1 g/dL   Albumin 4.5 3.5 - 5.0 g/dL   AST 22 15 - 41 U/L   ALT 18 0 - 44 U/L   Alkaline Phosphatase 47 38 - 126 U/L   Total Bilirubin 0.8 0.3 - 1.2 mg/dL   GFR calc non Af Amer >60 >60 mL/min   GFR calc Af Amer >60 >60 mL/min   Anion gap 12 5 - 15    Comment: Performed at [facility] [address], [place], [place] [address]  Troponin I (High Sensitivity)     Status: Abnormal   Collection Time: 05/30/19  4:24 AM  Result Value Ref Range   Troponin I (High Sensitivity) 266 (HH) <18 ng/L    Comment: CRITICAL RESULT CALLED TO, READ BACK BY AND VERIFIED WITH: Theodore Arellano 05/30/19 0529 Theodore Arellano Performed at [facility] [address] [place], [place] [address]   Lipid panel     Status: Abnormal   Collection Time: 05/30/19  4:24 AM  Result Value Ref Range   Cholesterol 141 0 - 200 mg/dL   Triglycerides 277 (H) <150 mg/dL   HDL 49 >40 mg/dL   Total CHOL/HDL Ratio 2.9 RATIO   VLDL 55 (H) 0 - 40 mg/dL   LDL Cholesterol 37 0 - 99 mg/dL    Comment:        Total Cholesterol/HDL:CHD Risk Coronary Heart Disease Risk Table                     Men   Women  1/2 Average Risk   3.4   3.3  Average Risk       5.0   4.4  2 X Average Risk   9.6   7.1  3 X Average Risk  23.4   11.0        Use the calculated Patient Ratio above and the CHD Risk Table to determine the patient's CHD Risk.  ATP III CLASSIFICATION (LDL):  <100     mg/dL   Optimal  100-129  mg/dL   Near or Above                    Optimal  130-159  mg/dL   Borderline  160-189  mg/dL   High  >190     mg/dL   Very High Performed at [facility] [address] [place], [place] [address]   SARS CORONAVIRUS 2 (TAT 6-12 HRS) Nasal Swab Aptima Multi Swab     Status: None   Collection Time: 05/30/19   4:27 AM   Specimen: Aptima Multi Swab; Nasal Swab  Result Value Ref Range   SARS Coronavirus 2 NEGATIVE NEGATIVE    Comment: (NOTE) SARS-CoV-2 target nucleic acids are NOT DETECTED. The SARS-CoV-2 RNA is generally detectable in upper and lower respiratory specimens during the acute phase of infection. Negative results do not preclude SARS-CoV-2 infection, do not rule out co-infections with other pathogens, and should not be used as the sole basis for treatment or other patient management decisions. Negative results must be combined with clinical observations, patient history, and epidemiological information. The expected result is Negative. Fact Sheet for Patients: SugarRoll.be Fact Sheet for Healthcare Providers: https://www.woods-mathews.com/ This test is not yet approved or cleared by the [place] FDA and  has been authorized for detection and/or diagnosis of SARS-CoV-2 by FDA under an Emergency Use Authorization (EUA). This EUA will remain  in effect (meaning this test can be used) for the duration of the COVID-19 declaration under Section 56 4(b)(1) of the Act, 21 U.S.C. section 360bbb-3(b)(1), unless the authorization is terminated or revoked sooner. Performed at [facility], [place] [address], [place], [place] [address]   Troponin I (High Sensitivity)     Status: Abnormal   Collection Time: 05/30/19  6:19 AM  Result Value Ref Range   Troponin I (High Sensitivity) 378 (HH) <18 ng/L    Comment: CRITICAL VALUE NOTED.  VALUE IS CONSISTENT WITH PREVIOUSLY REPORTED AND CALLED VALUE. (NOTE) Elevated high sensitivity troponin I (hsTnI) values and significant  changes across serial measurements may suggest ACS but many other  chronic and acute conditions are known to elevate hsTnI results.  Refer to the Links section for chest pain algorithms and additional  guidance. Performed at [facility], [place] [address]  [place], [place] [address]   CBG monitoring, ED     Status: Abnormal   Collection Time: 05/30/19  7:20 AM  Result Value Ref Range   Glucose-Capillary 115 (H) 70 - 99 mg/dL  HIV antibody (Routine Testing)     Status: None   Collection Time: 05/30/19  7:33 AM  Result Value Ref Range   HIV Screen 4th Generation wRfx Non Reactive Non Reactive    Comment: (NOTE) Performed At: [facility] [place], [place] [id number] [place] MD [id number]   TSH     Status: None   Collection Time: 05/30/19  7:33 AM  Result Value Ref Range   TSH 1.603 0.350 - 4.500 uIU/mL    Comment: Performed by a 3rd Generation assay with a functional sensitivity of <=0.01 uIU/mL. Performed at [facility], [place] [address] [place], [place] [address]   T4, free     Status: None   Collection Time: 05/30/19  7:33 AM  Result Value Ref Range   Free T4 1.12 0.61 - 1.12 ng/dL    Comment: (NOTE)  Biotin ingestion may interfere with free T4 tests. If the results are inconsistent with the TSH level, previous test results, or the clinical presentation, then consider biotin interference. If needed, order repeat testing after stopping biotin. Performed at [facility], [place] [address], [place], [place] [address]   Hemoglobin A1c     Status: Abnormal   Collection Time: 05/30/19  7:33 AM  Result Value Ref Range   Hgb A1c MFr Bld 6.7 (H) 4.8 - 5.6 %    Comment: (NOTE) Pre diabetes:          5.7%-6.4% Diabetes:              >6.4% Glycemic control for   <7.0% adults with diabetes    Mean Plasma Glucose 145.59 mg/dL    Comment: Performed at [place] [address], [place], [place] [address]  Glucose, capillary     Status: Abnormal   Collection Time: 05/30/19 11:50 AM  Result Value Ref Range   Glucose-Capillary 114 (H) 70 - 99 mg/dL  Heparin level (unfractionated)     Status: None   Collection Time: 05/30/19 11:54 AM  Result Value Ref Range   Heparin Unfractionated 0.67  0.30 - 0.70 IU/mL    Comment: (NOTE) If heparin results are below expected values, and patient dosage has  been confirmed, suggest follow up testing of antithrombin III levels. Performed at [facility], [place] [address] , [place] [address]   Glucose, capillary     Status: Abnormal   Collection Time: 05/30/19  4:42 PM  Result Value Ref Range   Glucose-Capillary 115 (H) 70 - 99 mg/dL  Heparin level (unfractionated)     Status: None   Collection Time: 05/30/19  7:34 PM  Result Value Ref Range   Heparin Unfractionated 0.35 0.30 - 0.70 IU/mL    Comment: (NOTE) If heparin results are below expected values, and patient dosage has  been confirmed, suggest follow up testing of antithrombin III levels. Performed at [facility], [place] [address] [place], [place] [address]   Glucose, capillary     Status: Abnormal   Collection Time: 05/30/19  9:57 PM  Result Value Ref Range   Glucose-Capillary 189 (H) 70 - 99 mg/dL  Heparin level (unfractionated)     Status: None   Collection Time: 05/31/19  4:13 AM  Result Value Ref Range   Heparin Unfractionated 0.55 0.30 - 0.70 IU/mL    Comment: (NOTE) If heparin results are below expected values, and patient dosage has  been confirmed, suggest follow up testing of antithrombin III levels. Performed at [facility], [place] [address], [place], [place] [address]   CBC     Status: None   Collection Time: 05/31/19  4:13 AM  Result Value Ref Range   WBC 6.4 4.0 - 10.5 K/uL   RBC 4.56 4.22 - 5.81 MIL/uL   Hemoglobin 13.6 13.0 - 17.0 g/dL   HCT 40.9 39.0 - 52.0 %   MCV 89.7 80.0 - 100.0 fL   MCH 29.8 26.0 - 34.0 pg   MCHC 33.3 30.0 - 36.0 g/dL   RDW 13.3 11.5 - 15.5 %   Platelets 214 150 - 400 K/uL   nRBC 0.0 0.0 - 0.2 %    Comment: Performed at [facility], [place] [address], [place], [place] [address]  Basic metabolic panel     Status: Abnormal   Collection Time: 05/31/19  4:13 AM  Result Value  Ref Range   Sodium 138  135 - 145 mmol/L   Potassium 4.1 3.5 - 5.1 mmol/L   Chloride 106 98 - 111 mmol/L   CO2 20 (L) 22 - 32 mmol/L   Glucose, Bld 114 (H) 70 - 99 mg/dL   BUN 17 6 - 20 mg/dL   Creatinine, Ser 1.29 (H) 0.61 - 1.24 mg/dL   Calcium 9.1 8.9 - 10.3 mg/dL   GFR calc non Af Amer >60 >60 mL/min   GFR calc Af Amer >60 >60 mL/min   Anion gap 12 5 - 15    Comment: Performed at [facility] [address], [place], [place] [address]  Glucose, capillary     Status: Abnormal   Collection Time: 05/31/19  8:04 AM  Result Value Ref Range   Glucose-Capillary 102 (H) 70 - 99 mg/dL    ECG   Sinus rhythm at 66, 1st degree AVB, inferior infarct, anterolateral ischemia- Personally Reviewed  Telemetry   Sinus with 1st degree AVB, periods of 2nd degree type 2 AVB - Personally Reviewed  Radiology    Dg Chest Portable 1 View  Result Date: 05/30/2019 CLINICAL DATA:  Chest pain EXAM: PORTABLE CHEST 1 VIEW COMPARISON:  11/27/2017 FINDINGS: The heart size and mediastinal contours are within normal limits. Both lungs are clear. The visualized skeletal structures are unremarkable. IMPRESSION: No active disease. Electronically Signed   By: Ulyses Jarred M.D.   On: 05/30/2019 04:28    Cardiac Studies   N/A  Assessment   1. Principal Problem: 2.   Chest pain 3. Active Problems: 4.   Renal transplant recipient 5.   Essential hypertension 6.   HLD (hyperlipidemia) 7.   Type 2 diabetes mellitus with vascular disease ([place]) 8.   Coronary artery disease due to lipid rich plaque 9.   Elevated troponin 10.   Coronary artery disease involving native coronary artery of native heart with unstable angina pectoris ([place]) 11.   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   Plan   1. Chest pain has resolved - will need repeat coronary angiography given failure of medical therapy and likely salvage PCI. Creatinine up since yesterday- encouraged po today and will start hydrating tonight.  Keep NPO p MN. Decrease coreg to 6.25 mg BID given transient AVB, starting this morning.  Time Spent Directly with Patient:  I have spent a total of 25 minutes with the patient reviewing hospital notes, telemetry, EKGs, labs and examining the patient as well as establishing an assessment and plan that was discussed personally with the patient.  > 50% of time was spent in direct patient care.  Length of Stay:  LOS: 1 day   Pixie Casino, MD, [facility], [place] Director of the Advanced Lipid Disorders &  Cardiovascular Risk Reduction Clinic Diplomate of the American Board of Clinical Lipidology Attending Cardiologist  Direct Dial: [phone]  Fax: [phone]  Website:  [email or web address]  Nadean Corwin Aseem Sessums 05/31/2019, 8:58 AM

## 2019-05-31 NOTE — Progress Notes (Signed)
DAILY PROGRESS NOTE   Patient Name: Theodore Arellano Date of Encounter: 05/31/2019 Cardiologist: No primary care provider on file.  Chief Complaint   No chest pain  Patient Profile   Theodore Arellano is a 58 y.o. male with a history of hypertension, hyperlipidemia, diabetes, severe three-vessel coronary disease, end-stage renal disease status post transplant in 2011, who presents due to chest pain.  Subjective   No chest pain - vitals stable overnight. Creatinine up today at 1.29. COVID negative. [place] level pending. Plan for [facility] tomorrow. Telemetry shows some 2nd degree type 2 AVB this morning for several intervals.  Objective   Vitals:   05/31/19 0445 05/31/19 0500 05/31/19 0516 05/31/19 0800  BP: (!) 138/95 135/89  131/83  Pulse: 63 (!) 59  60  Resp: 18 14  16   Temp:   98 F (36.7 C)   TempSrc:   Oral   SpO2: 100% 99%  98%  Weight:   83.8 kg   Height:        Intake/Output Summary (Last 24 hours) at 05/31/2019 0858 Last data filed at 05/31/2019 0500 Gross per 24 hour  Intake 513.7 ml  Output -  Net 513.7 ml   Filed Weights   05/30/19 0417 05/31/19 0516  Weight: 86.2 kg 83.8 kg    Physical Exam   General appearance: alert and no distress Lungs: clear to auscultation bilaterally Heart: regular rate and rhythm, S1, S2 normal, no murmur, click, rub or gallop Extremities: extremities normal, atraumatic, no cyanosis or edema Neurologic: Grossly normal  Inpatient Medications    Scheduled Meds: . aspirin EC  81 mg Oral Daily  . carvedilol  12.5 mg Oral BID WC  . insulin aspart  0-15 Units Subcutaneous TID WC  . lisinopril  10 mg Oral Daily  . magnesium oxide  400 mg Oral BID  . mycophenolate  720 mg Oral BID  . pantoprazole  40 mg Oral Daily  . predniSONE  5 mg Oral Q breakfast  . ranolazine  500 mg Oral BID  . rosuvastatin  40 mg Oral q1800  . sodium bicarbonate  1,300 mg Oral BID  . tacrolimus  3 mg Oral Daily   And  . tacrolimus  2 mg Oral QHS     Continuous Infusions: . sodium chloride 10 mL/hr at 05/30/19 0526  . heparin 1,200 Units/hr (05/31/19 0230)  . nitroGLYCERIN 15 mcg/min (05/31/19 0110)    PRN Meds: nitroGLYCERIN, ondansetron (ZOFRAN) IV   Labs   Results for orders placed or performed during the hospital encounter of 05/30/19 (from the past 48 hour(s))  CBC with Differential/Platelet     Status: None   Collection Time: 05/30/19  4:24 AM  Result Value Ref Range   WBC 6.5 4.0 - 10.5 K/uL   RBC 4.57 4.22 - 5.81 MIL/uL   Hemoglobin 13.6 13.0 - 17.0 g/dL   HCT 41.8 39.0 - 52.0 %   MCV 91.5 80.0 - 100.0 fL   MCH 29.8 26.0 - 34.0 pg   MCHC 32.5 30.0 - 36.0 g/dL   RDW 13.5 11.5 - 15.5 %   Platelets 216 150 - 400 K/uL   nRBC 0.0 0.0 - 0.2 %   Neutrophils Relative % 65 %   Neutro Abs 4.3 1.7 - 7.7 K/uL   Lymphocytes Relative 20 %   Lymphs Abs 1.3 0.7 - 4.0 K/uL   Monocytes Relative 11 %   Monocytes Absolute 0.7 0.1 - 1.0 K/uL   Eosinophils Relative 3 %  Eosinophils Absolute 0.2 0.0 - 0.5 K/uL   Basophils Relative 1 %   Basophils Absolute 0.0 0.0 - 0.1 K/uL   Immature Granulocytes 0 %   Abs Immature Granulocytes 0.02 0.00 - 0.07 K/uL    Comment: Performed at [facility], [place] [address], [place], [place] [address]  Protime-INR     Status: None   Collection Time: 05/30/19  4:24 AM  Result Value Ref Range   Prothrombin Time 13.1 11.4 - 15.2 seconds   INR 1.0 0.8 - 1.2    Comment: (NOTE) INR goal varies based on device and disease states. Performed at [facility], [place] [address], [place], [place] [address]   APTT     Status: None   Collection Time: 05/30/19  4:24 AM  Result Value Ref Range   aPTT 30 24 - 36 seconds    Comment: Performed at [facility] [address], [place], [place] [address]  Comprehensive metabolic panel     Status: Abnormal   Collection Time: 05/30/19  4:24 AM  Result Value Ref Range   Sodium 136 135 - 145 mmol/L   Potassium 3.7 3.5 - 5.1 mmol/L   Chloride 105 98  - 111 mmol/L   CO2 19 (L) 22 - 32 mmol/L   Glucose, Bld 113 (H) 70 - 99 mg/dL   BUN 12 6 - 20 mg/dL   Creatinine, Ser 0.98 0.61 - 1.24 mg/dL   Calcium 9.5 8.9 - 10.3 mg/dL   Total Protein 7.9 6.5 - 8.1 g/dL   Albumin 4.5 3.5 - 5.0 g/dL   AST 22 15 - 41 U/L   ALT 18 0 - 44 U/L   Alkaline Phosphatase 47 38 - 126 U/L   Total Bilirubin 0.8 0.3 - 1.2 mg/dL   GFR calc non Af Amer >60 >60 mL/min   GFR calc Af Amer >60 >60 mL/min   Anion gap 12 5 - 15    Comment: Performed at [facility] [address] [place], [place] [address]  Troponin I (High Sensitivity)     Status: Abnormal   Collection Time: 05/30/19  4:24 AM  Result Value Ref Range   Troponin I (High Sensitivity) 266 (HH) <18 ng/L    Comment: CRITICAL RESULT CALLED TO, READ BACK BY AND VERIFIED WITH: Neldon Labella 05/30/19 0529 WAYK Performed at [facility] [address], [place], [place] [address]   Lipid panel     Status: Abnormal   Collection Time: 05/30/19  4:24 AM  Result Value Ref Range   Cholesterol 141 0 - 200 mg/dL   Triglycerides 277 (H) <150 mg/dL   HDL 49 >40 mg/dL   Total CHOL/HDL Ratio 2.9 RATIO   VLDL 55 (H) 0 - 40 mg/dL   LDL Cholesterol 37 0 - 99 mg/dL    Comment:        Total Cholesterol/HDL:CHD Risk Coronary Heart Disease Risk Table                     Men   Women  1/2 Average Risk   3.4   3.3  Average Risk       5.0   4.4  2 X Average Risk   9.6   7.1  3 X Average Risk  23.4   11.0        Use the calculated Patient Ratio above and the CHD Risk Table to determine the patient's CHD Risk.  ATP III CLASSIFICATION (LDL):  <100     mg/dL   Optimal  100-129  mg/dL   Near or Above                    Optimal  130-159  mg/dL   Borderline  160-189  mg/dL   High  >190     mg/dL   Very High Performed at [facility] [address], [place], [place] [address]   SARS CORONAVIRUS 2 (TAT 6-12 HRS) Nasal Swab Aptima Multi Swab     Status: None   Collection Time: 05/30/19  4:27 AM    Specimen: Aptima Multi Swab; Nasal Swab  Result Value Ref Range   SARS Coronavirus 2 NEGATIVE NEGATIVE    Comment: (NOTE) SARS-CoV-2 target nucleic acids are NOT DETECTED. The SARS-CoV-2 RNA is generally detectable in upper and lower respiratory specimens during the acute phase of infection. Negative results do not preclude SARS-CoV-2 infection, do not rule out co-infections with other pathogens, and should not be used as the sole basis for treatment or other patient management decisions. Negative results must be combined with clinical observations, patient history, and epidemiological information. The expected result is Negative. Fact Sheet for Patients: SugarRoll.be Fact Sheet for Healthcare Providers: https://www.woods-mathews.com/ This test is not yet approved or cleared by the [place] FDA and  has been authorized for detection and/or diagnosis of SARS-CoV-2 by FDA under an Emergency Use Authorization (EUA). This EUA will remain  in effect (meaning this test can be used) for the duration of the COVID-19 declaration under Section 56 4(b)(1) of the Act, 21 U.S.C. section 360bbb-3(b)(1), unless the authorization is terminated or revoked sooner. Performed at [facility], [place] [address] [place], [place] [address]   Troponin I (High Sensitivity)     Status: Abnormal   Collection Time: 05/30/19  6:19 AM  Result Value Ref Range   Troponin I (High Sensitivity) 378 (HH) <18 ng/L    Comment: CRITICAL VALUE NOTED.  VALUE IS CONSISTENT WITH PREVIOUSLY REPORTED AND CALLED VALUE. (NOTE) Elevated high sensitivity troponin I (hsTnI) values and significant  changes across serial measurements may suggest ACS but many other  chronic and acute conditions are known to elevate hsTnI results.  Refer to the Links section for chest pain algorithms and additional  guidance. Performed at [facility], [place] [address] [place], [place]  [address]   CBG monitoring, ED     Status: Abnormal   Collection Time: 05/30/19  7:20 AM  Result Value Ref Range   Glucose-Capillary 115 (H) 70 - 99 mg/dL  HIV antibody (Routine Testing)     Status: None   Collection Time: 05/30/19  7:33 AM  Result Value Ref Range   HIV Screen 4th Generation wRfx Non Reactive Non Reactive    Comment: (NOTE) Performed At: [facility] [place], [place] [id number] Rush Farmer MD OZ:[id number]   TSH     Status: None   Collection Time: 05/30/19  7:33 AM  Result Value Ref Range   TSH 1.603 0.350 - 4.500 uIU/mL    Comment: Performed by a 3rd Generation assay with a functional sensitivity of <=0.01 uIU/mL. Performed at [facility], [place] [address] [place], [place] [address]   T4, free     Status: None   Collection Time: 05/30/19  7:33 AM  Result Value Ref Range   Free T4 1.12 0.61 - 1.12 ng/dL    Comment: (NOTE)  Biotin ingestion may interfere with free T4 tests. If the results are inconsistent with the TSH level, previous test results, or the clinical presentation, then consider biotin interference. If needed, order repeat testing after stopping biotin. Performed at [facility], [place] [address] [place], [place] [address]   Hemoglobin A1c     Status: Abnormal   Collection Time: 05/30/19  7:33 AM  Result Value Ref Range   Hgb A1c MFr Bld 6.7 (H) 4.8 - 5.6 %    Comment: (NOTE) Pre diabetes:          5.7%-6.4% Diabetes:              >6.4% Glycemic control for   <7.0% adults with diabetes    Mean Plasma Glucose 145.59 mg/dL    Comment: Performed at [facility] [address] [place], [place] [address]  Glucose, capillary     Status: Abnormal   Collection Time: 05/30/19 11:50 AM  Result Value Ref Range   Glucose-Capillary 114 (H) 70 - 99 mg/dL  Heparin level (unfractionated)     Status: None   Collection Time: 05/30/19 11:54 AM  Result Value Ref Range   Heparin Unfractionated 0.67 0.30 - 0.70 IU/mL     Comment: (NOTE) If heparin results are below expected values, and patient dosage has  been confirmed, suggest follow up testing of antithrombin III levels. Performed at [facility], [place] [address] [place], [place] [address]   Glucose, capillary     Status: Abnormal   Collection Time: 05/30/19  4:42 PM  Result Value Ref Range   Glucose-Capillary 115 (H) 70 - 99 mg/dL  Heparin level (unfractionated)     Status: None   Collection Time: 05/30/19  7:34 PM  Result Value Ref Range   Heparin Unfractionated 0.35 0.30 - 0.70 IU/mL    Comment: (NOTE) If heparin results are below expected values, and patient dosage has  been confirmed, suggest follow up testing of antithrombin III levels. Performed at [facility], [place] [address], [place], [place] [address]   Glucose, capillary     Status: Abnormal   Collection Time: 05/30/19  9:57 PM  Result Value Ref Range   Glucose-Capillary 189 (H) 70 - 99 mg/dL  Heparin level (unfractionated)     Status: None   Collection Time: 05/31/19  4:13 AM  Result Value Ref Range   Heparin Unfractionated 0.55 0.30 - 0.70 IU/mL    Comment: (NOTE) If heparin results are below expected values, and patient dosage has  been confirmed, suggest follow up testing of antithrombin III levels. Performed at [facility], [place] [address] [place], [place] [address]   CBC     Status: None   Collection Time: 05/31/19  4:13 AM  Result Value Ref Range   WBC 6.4 4.0 - 10.5 K/uL   RBC 4.56 4.22 - 5.81 MIL/uL   Hemoglobin 13.6 13.0 - 17.0 g/dL   HCT 40.9 39.0 - 52.0 %   MCV 89.7 80.0 - 100.0 fL   MCH 29.8 26.0 - 34.0 pg   MCHC 33.3 30.0 - 36.0 g/dL   RDW 13.3 11.5 - 15.5 %   Platelets 214 150 - 400 K/uL   nRBC 0.0 0.0 - 0.2 %    Comment: Performed at [facility], [place] [address] [place], [place] [address]  Basic metabolic panel     Status: Abnormal   Collection Time: 05/31/19  4:13 AM  Result Value  Ref Range   Sodium 138 135 - 145 mmol/L    Potassium 4.1 3.5 - 5.1 mmol/L   Chloride 106 98 - 111 mmol/L   CO2 20 (L) 22 - 32 mmol/L   Glucose, Bld 114 (H) 70 - 99 mg/dL   BUN 17 6 - 20 mg/dL   Creatinine, Ser 1.29 (H) 0.61 - 1.24 mg/dL   Calcium 9.1 8.9 - 10.3 mg/dL   GFR calc non Af Amer >60 >60 mL/min   GFR calc Af Amer >60 >60 mL/min   Anion gap 12 5 - 15    Comment: Performed at [facility] [address], [place], [place] [address]  Glucose, capillary     Status: Abnormal   Collection Time: 05/31/19  8:04 AM  Result Value Ref Range   Glucose-Capillary 102 (H) 70 - 99 mg/dL    ECG   Sinus rhythm at 66, 1st degree AVB, inferior infarct, anterolateral ischemia- Personally Reviewed  Telemetry   Sinus with 1st degree AVB, periods of 2nd degree type 2 AVB - Personally Reviewed  Radiology    Dg Chest Portable 1 View  Result Date: 05/30/2019 CLINICAL DATA:  Chest pain EXAM: PORTABLE CHEST 1 VIEW COMPARISON:  11/27/2017 FINDINGS: The heart size and mediastinal contours are within normal limits. Both lungs are clear. The visualized skeletal structures are unremarkable. IMPRESSION: No active disease. Electronically Signed   By: Ulyses Jarred M.D.   On: 05/30/2019 04:28    Cardiac Studies   N/A  Assessment   1. Principal Problem: 2.   Chest pain 3. Active Problems: 4.   Renal transplant recipient 5.   Essential hypertension 6.   HLD (hyperlipidemia) 7.   Type 2 diabetes mellitus with vascular disease ([place]) 8.   Coronary artery disease due to lipid rich plaque 9.   Elevated troponin 10.   Coronary artery disease involving native coronary artery of native heart with unstable angina pectoris ([place]) 11.   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   Plan   1. Chest pain has resolved - will need repeat coronary angiography given failure of medical therapy and likely salvage PCI. Creatinine up since yesterday- encouraged po today and will start hydrating tonight. Keep NPO p MN. Decrease  coreg to 6.25 mg BID given transient AVB, starting this morning.  Time Spent Directly with Patient:  I have spent a total of 25 minutes with the patient reviewing hospital notes, telemetry, EKGs, labs and examining the patient as well as establishing an assessment and plan that was discussed personally with the patient.  > 50% of time was spent in direct patient care.  Length of Stay:  LOS: 1 day   Pixie Casino, MD, [facility], [place] Director of the [facility] Lipid [facility]  Card[facility]ovascular Risk [facility] Diplomate of the American Board of Clinical Lipidology Attending Cardiologist  Direct Dial: [phone]  Fax: [phone]  Website:  [email or web address]  Nadean Corwin Olvin Rohr 05/31/2019, 8:58 AM

## 2019-06-01 ENCOUNTER — Encounter (HOSPITAL_COMMUNITY)
Admission: EM | Disposition: A | Discharge status: Home / Self Care | Payer: Self-pay | Source: Home / Self Care | Attending: Cardiology

## 2019-06-01 ENCOUNTER — Encounter (HOSPITAL_COMMUNITY): Payer: Self-pay | Admitting: Cardiovascular Disease

## 2019-06-01 DIAGNOSIS — E782 Mixed hyperlipidemia: Secondary | ICD-10-CM

## 2019-06-01 DIAGNOSIS — I25110 Atherosclerotic heart disease of native coronary artery with unstable angina pectoris: Secondary | ICD-10-CM

## 2019-06-01 DIAGNOSIS — I10 Essential (primary) hypertension: Secondary | ICD-10-CM

## 2019-06-01 HISTORY — PX: LEFT HEART CATH AND CORONARY ANGIOGRAPHY: CATH118249

## 2019-06-01 LAB — CBC
HCT: 39.8 % (ref 39.0–52.0)
Hemoglobin: 12.8 g/dL — ABNORMAL LOW (ref 13.0–17.0)
MCH: 29.4 pg (ref 26.0–34.0)
MCHC: 32.2 g/dL (ref 30.0–36.0)
MCV: 91.3 fL (ref 80.0–100.0)
Platelets: 194 10*3/uL (ref 150–400)
RBC: 4.36 MIL/uL (ref 4.22–5.81)
RDW: 13.6 % (ref 11.5–15.5)
WBC: 6.7 10*3/uL (ref 4.0–10.5)
nRBC: 0 % (ref 0.0–0.2)

## 2019-06-01 LAB — BASIC METABOLIC PANEL
Anion gap: 10 (ref 5–15)
BUN: 14 mg/dL (ref 6–20)
CO2: 20 mmol/L — ABNORMAL LOW (ref 22–32)
Calcium: 9.2 mg/dL (ref 8.9–10.3)
Chloride: 106 mmol/L (ref 98–111)
Creatinine, Ser: 1.2 mg/dL (ref 0.61–1.24)
GFR calc Af Amer: >60 mL/min (ref >60)
GFR calc non Af Amer: >60 mL/min (ref >60)
Glucose, Bld: 103 mg/dL — ABNORMAL HIGH (ref 70–99)
Potassium: 4.1 mmol/L (ref 3.5–5.1)
Sodium: 136 mmol/L (ref 135–145)

## 2019-06-01 LAB — GLUCOSE, CAPILLARY
Glucose-Capillary: 99 mg/dL (ref 70–99)
Glucose-Capillary: 105 mg/dL — ABNORMAL HIGH (ref 70–99)
Glucose-Capillary: 132 mg/dL — ABNORMAL HIGH (ref 70–99)

## 2019-06-01 LAB — POCT ACTIVATED CLOTTING TIME: Activated Clotting Time: 153 seconds

## 2019-06-01 LAB — HEPARIN LEVEL (UNFRACTIONATED): Heparin Unfractionated: 0.62 IU/mL (ref 0.30–0.70)

## 2019-06-01 SURGERY — LEFT HEART CATH AND CORONARY ANGIOGRAPHY
Anesthesia: LOCAL

## 2019-06-01 MED ORDER — HEPARIN (PORCINE) IN NACL 1000-0.9 UT/500ML-% IV SOLN
INTRAVENOUS | Status: DC | PRN
  Administered 2019-06-01: 500 mL

## 2019-06-01 MED ORDER — SODIUM CHLORIDE 0.9 % IV SOLN: INTRAVENOUS | Status: AC

## 2019-06-01 MED ORDER — FENTANYL CITRATE (PF) 100 MCG/2ML IJ SOLN
INTRAMUSCULAR | Status: DC | PRN
  Administered 2019-06-01: 25 ug via INTRAVENOUS

## 2019-06-01 MED ORDER — MIDAZOLAM HCL 2 MG/2ML IJ SOLN
INTRAMUSCULAR | Status: DC | PRN
  Administered 2019-06-01: 1 mg via INTRAVENOUS

## 2019-06-01 MED ORDER — SODIUM CHLORIDE 0.9% FLUSH: 3.0000 mL | INTRAVENOUS | Status: DC | PRN

## 2019-06-01 MED ORDER — IOHEXOL 350 MG/ML SOLN
INTRAVENOUS | Status: DC | PRN
  Administered 2019-06-01: 09:00:00 50 mL via INTRA_ARTERIAL

## 2019-06-01 MED ORDER — SODIUM CHLORIDE 0.9% FLUSH: 3.0000 mL | Freq: Two times a day (BID) | INTRAVENOUS | Status: DC

## 2019-06-01 MED ORDER — HYDRALAZINE HCL 20 MG/ML IJ SOLN: 10.0000 mg | INTRAMUSCULAR | Status: AC | PRN

## 2019-06-01 MED ORDER — MIDAZOLAM HCL 2 MG/2ML IJ SOLN
INTRAMUSCULAR | Status: AC
  Filled 2019-06-01: qty 2

## 2019-06-01 MED ORDER — FENTANYL CITRATE (PF) 100 MCG/2ML IJ SOLN
INTRAMUSCULAR | Status: AC
  Filled 2019-06-01: qty 2

## 2019-06-01 MED ORDER — SODIUM CHLORIDE 0.9 % IV SOLN: 250.0000 mL | INTRAVENOUS | Status: DC | PRN

## 2019-06-01 MED ORDER — LABETALOL HCL 5 MG/ML IV SOLN: 10.0000 mg | INTRAVENOUS | Status: AC | PRN

## 2019-06-01 MED ORDER — LIDOCAINE HCL (PF) 1 % IJ SOLN
INTRAMUSCULAR | Status: DC | PRN
  Administered 2019-06-01: 15 mL

## 2019-06-01 MED ORDER — LIDOCAINE HCL (PF) 1 % IJ SOLN
INTRAMUSCULAR | Status: AC
  Filled 2019-06-01: qty 30

## 2019-06-01 MED ORDER — HEPARIN (PORCINE) IN NACL 1000-0.9 UT/500ML-% IV SOLN
INTRAVENOUS | Status: AC
  Filled 2019-06-01: qty 1000

## 2019-06-01 MED ORDER — ISOSORBIDE MONONITRATE ER 60 MG PO TB24: 60.0000 mg | ORAL_TABLET | Freq: Every day | ORAL | 0 refills | Status: DC

## 2019-06-01 SURGICAL SUPPLY — 8 items
CATH INFINITI 5FR MULTPACK ANG (CATHETERS) ×1 IMPLANT
KIT HEART LEFT (KITS) ×2 IMPLANT
PACK CARDIAC CATHETERIZATION (CUSTOM PROCEDURE TRAY) ×2 IMPLANT
SHEATH PINNACLE 5F 10CM (SHEATH) ×1 IMPLANT
SHEATH PROBE COVER 6X72 (BAG) ×1 IMPLANT
TRANSDUCER W/STOPCOCK (MISCELLANEOUS) ×2 IMPLANT
TUBING CIL FLEX 10 FLL-RA (TUBING) ×2 IMPLANT
WIRE EMERALD 3MM-J .035X150CM (WIRE) ×1 IMPLANT

## 2019-06-01 NOTE — Progress Notes (Signed)
Left femoral site level 0 prior to sheath pull. Left PT pulse documented by doppler. Sheath removed and manual pressure held by Eddie Dibbles, RN for 20 minutes. Site level 0 post sheath pull. Patient given post sheath pull instructions and verbalizes understanding. Left PT pulse documented by doppler post sheath pull.   Bedrest begins at 0945 and ends at 1345.

## 2019-06-01 NOTE — Discharge Instructions (Signed)
Femoral Site Care °This sheet gives you information about how to care for yourself after your procedure. Your health care provider may also give you more specific instructions. If you have problems or questions, contact your health care provider. °What can I expect after the procedure? °After the procedure, it is common to have: °· Bruising that usually fades within 1-2 weeks. °· Tenderness at the site. °Follow these instructions at home: °Wound care °· Follow instructions from your health care provider about how to take care of your insertion site. Make sure you: °? Wash your hands with soap and water before you change your bandage (dressing). If soap and water are not available, use hand sanitizer. °? Change your dressing as told by your health care provider. °? Leave stitches (sutures), skin glue, or adhesive strips in place. These skin closures may need to stay in place for 2 weeks or longer. If adhesive strip edges start to loosen and curl up, you may trim the loose edges. Do not remove adhesive strips completely unless your health care provider tells you to do that. °· Do not take baths, swim, or use a hot tub until your health care provider approves. °· You may shower 24-48 hours after the procedure or as told by your health care provider. °? Gently wash the site with plain soap and water. °? Pat the area dry with a clean towel. °? Do not rub the site. This may cause bleeding. °· Do not apply powder or lotion to the site. Keep the site clean and dry. °· Check your femoral site every day for signs of infection. Check for: °? Redness, swelling, or pain. °? Fluid or blood. °? Warmth. °? Pus or a bad smell. °Activity °· For the first 2-3 days after your procedure, or as long as directed: °? Avoid climbing stairs as much as possible. °? Do not squat. °· Do not lift anything that is heavier than 10 lb (4.5 kg), or the limit that you are told, until your health care provider says that it is safe. °· Rest as  directed. °? Avoid sitting for a long time without moving. Get up to take short walks every 1-2 hours. °· Do not drive for 24 hours if you were given a medicine to help you relax (sedative). °General instructions °· Take over-the-counter and prescription medicines only as told by your health care provider. °· Keep all follow-up visits as told by your health care provider. This is important. °Contact a health care provider if you have: °· A fever or chills. °· You have redness, swelling, or pain around your insertion site. °Get help right away if: °· The catheter insertion area swells very fast. °· You pass out. °· You suddenly start to sweat or your skin gets clammy. °· The catheter insertion area is bleeding, and the bleeding does not stop when you hold steady pressure on the area. °· The area near or just beyond the catheter insertion site becomes pale, cool, tingly, or numb. °These symptoms may represent a serious problem that is an emergency. Do not wait to see if the symptoms will go away. Get medical help right away. Call your local emergency services (911 in the [place] Do not drive yourself to the hospital. °Summary °· After the procedure, it is common to have bruising that usually fades within 1-2 weeks. °· Check your femoral site every day for signs of infection. °· Do not lift anything that is heavier than 10 lb (4.5 kg), or the   limit that you are told, until your health care provider says that it is safe. °This information is not intended to replace advice given to you by your health care provider. Make sure you discuss any questions you have with your health care provider. °Document Released: 05/21/2014 Document Revised: 09/30/2017 Document Reviewed: 09/30/2017 °Elsevier Patient Education © 2020 [employer] ° °

## 2019-06-01 NOTE — Plan of Care (Signed)
Groin site intact; discs from Cath lab along with instructions provided to pt.

## 2019-06-01 NOTE — Discharge Summary (Addendum)
Discharge Summary    Patient ID: RENALD HAITHCOCK,  MRN: [id number], DOB/AGE: 03-06-1961 58 y.o.  Admit date: 05/30/2019 Discharge date: 06/01/2019  Primary Care Provider: [name] Primary Cardiologist: Jenean Lindau, MD  Discharge Diagnoses    Principal Problem:   Chest pain Active Problems:   Renal transplant recipient   Essential hypertension   HLD (hyperlipidemia)   Type 2 diabetes mellitus with vascular disease ([place])   Coronary artery disease due to lipid rich plaque   Elevated troponin   Coronary artery disease involving native coronary artery of native heart with unstable angina pectoris ([place])   Unstable angina pectoris due to coronary arteriosclerosis ([facility])   Allergies Allergies  Allergen Reactions   Percocet [Oxycodone-Acetaminophen] Hives and Itching   Vicodin [Hydrocodone-Acetaminophen] Hives and Itching   Shellfish Allergy Hives   Tomato Hives    Diagnostic Studies/Procedures    Cath: 06/01/19   Mid LAD to Dist LAD lesion is 90% stenosed.  Prox LAD lesion is 100% stenosed.  Ost LAD to Prox LAD lesion is 80% stenosed.  Ost 1st Mrg lesion is 99% stenosed with 99% stenosed side branch in Lat 1st Mrg.  Ost 1st Mrg to 1st Mrg lesion is 100% stenosed.  Mid Cx to Dist Cx lesion is 75% stenosed.  RPAV lesion is 100% stenosed.  Mid RCA to Dist RCA lesion is 70% stenosed.  Prox RCA to Mid RCA lesion is 90% stenosed.  Prox RCA lesion is 100% stenosed.  Ost Ramus lesion is 95% stenosed.  Ramus lesion is 70% stenosed.  Lat Ramus lesion is 70% stenosed.   1. Severe triple vessel CAD 2. The heavily calcified LAD is occluded in the mid vessel. The distal LAD fills from left to left bridging collaterals.  3. The heavily calcified Circumflex has severe moderate mid and severe distal vessel stenosis. The large obtuse marginal branch has severe diffuse disease.  4. The RCA is occluded proximally. The distal vessel branches fill from left  to right collaterals.  5. Mild elevation LV filling pressures  Recommendations: Continue medical management of CAD. He has diffusely diseased, heavily calcified vessels. The LAD and RCA are chronically occluded. The OM branch and distal Circumflex have severe diffuse disease, not amenable to PCI. No significant change in his severe CAD since cath in 2019. He was not felt to be a candidate for bypass in March 2019. He was seen then by Dr. Roxan Hockey with CT surgery. Also not felt to be a candidate for PCI then.   Diagnostic Dominance: Right  TTE: 05/31/19  IMPRESSIONS    1. The left ventricle has moderately reduced systolic function, with an ejection fraction of 35-40%. The cavity size was mildly dilated. There is mildly increased left ventricular wall thickness. Left ventricular diastolic Doppler parameters are  consistent with impaired relaxation. Indeterminate filling pressures The E/e' is 8-15.  2. Severe akinesis of the left ventricular, entire anterior wall, anteroseptal wall and apical segment.  3. The mitral valve is abnormal. Mild thickening of the mitral valve leaflet.  4. The tricuspid valve is grossly normal.  5. The aortic valve is tricuspid. Mild sclerosis of the aortic valve. Aortic valve regurgitation is mild by color flow Doppler.  6. The aorta is normal unless otherwise noted.  SUMMARY   LVEF 35-40%, LAD territory wall motion abnormality c/w prior infarct, mild LVH, grade 1 DD, indeterminate LV filling pressure, mild MR, mild AI, normal IVC _____________   History of Present Illness  Mr. Oscar is a 58 year old gentleman with hypertension, hyperlipidemia, diabetes, end-stage renal disease status post renal transplant 2011, known three-vessel coronary artery disease who presented as a STEMI activation in the setting of chest pain.  The patient reported that following his March 2019 hospitalization his chest pain initially abated but over the last several months has  been increasing in frequency.  It was now happening on a daily basis.  He had a severe episode the night of admission which is why he called EMS.  There was concern for inferior ST elevation and thus code STEMI was activated.  EMS was called for his chest pain this evening and he initially had 10 out of 10 pain but received 2 sublingual nitroglycerin. By the time he arrived to the emergency room he was chest pain-free.  Notably left heart cath from March 2019 showed a 100% occluded proximal LAD, 100 Percocet occluded proximal RCA both of these vessels fill via collaterals distally.  Also with a 95% ramus, 99% OM lesions.  Discussion at that time was that patient was not a surgical candidate for CABG and that given heavily calcified significant coronary multivessel coronary disease treatment with PCI would require a significant dye load.  In the setting of his renal transplant, decision was made for medical management of coronary artery disease.  Following the patient's initial evaluation, he initially remained chest pain-free.  However, he did develop recurrent severe, substernal chest pressure.  And was started on a nitroglycerin infusion in the ED.  Repeat EKG at that time showed isolated 1 mm ST elevation in lead III.  No elevation in leads II or aVF. He was admitted and placed on IV heparin with plans for possible cath.   Hospital Course     HsT peaked at 378. Underwent cardiac cath noted above with severe triple vessel CAD with heavily calcified LAD with collaterals, and Lcx. RCA is occluded proximally with distal branches from left to right collaterals. Mild elevation LV filling pressures. He was seen back in 2019 by TCTS and felt not to be a candidate for surgery or PCI. Echo with known EF of 35-40% with LVH, and severe akinesis of the LV, and anterior, anteroseptal, and apical wall. Will plan to increase his Imdur from 30mg  to 60mg  daily. Continue home medications including ASA, statin, BB, ACEi,  Imdur and Ranexa. Able to ambulate without recurrent chest pain. No complications noted post cath.   General: Well developed, well nourished, male appearing in no acute distress. Head: Normocephalic, atraumatic.  Neck: Supple without bruits, no JVD. Lungs:  Resp regular and unlabored, CTA. Heart: RRR, S1, S2, no S3, S4, or murmur; no rub. Abdomen: Soft, non-tender, non-distended with normoactive bowel sounds. No hepatomegaly. No rebound/guarding. No obvious abdominal masses. Extremities: No clubbing, cyanosis, edema. Distal pedal pulses are 2+ bilaterally. Left femoral cath site stable without bruising or hematoma Neuro: Alert and oriented X 3. Moves all extremities spontaneously. Psych: Normal affect.  XADEN KAUFMAN was seen by Dr. Gwenlyn Found and determined stable for discharge home. Follow up in the office has been arranged. Medications are listed below.   _____________  Discharge Vitals Blood pressure 110/73, pulse 63, temperature 97.6 F (36.4 C), temperature source Oral, resp. rate 17, height 5\' 7"  (1.702 m), weight 85.5 kg, SpO2 100 %.  Filed Weights   05/30/19 0417 05/31/19 0516 06/01/19 0341  Weight: 86.2 kg 83.8 kg 85.5 kg    Labs & Radiologic Studies    CBC Recent Labs  05/30/19 0424 05/31/19 0413 06/01/19 0449  WBC 6.5 6.4 6.7  NEUTROABS 4.3  --   --   HGB 13.6 13.6 12.8*  HCT 41.8 40.9 39.8  MCV 91.5 89.7 91.3  PLT 216 214 656   Basic Metabolic Panel Recent Labs    05/31/19 0413 06/01/19 0449  NA 138 136  K 4.1 4.1  CL 106 106  CO2 20* 20*  GLUCOSE 114* 103*  BUN 17 14  CREATININE 1.29* 1.20  CALCIUM 9.1 9.2   Liver Function Tests Recent Labs    05/30/19 0424  AST 22  ALT 18  ALKPHOS 47  BILITOT 0.8  PROT 7.9  ALBUMIN 4.5   No results for input(s): LIPASE, AMYLASE in the last 72 hours. Cardiac Enzymes No results for input(s): CKTOTAL, CKMB, CKMBINDEX, TROPONINI in the last 72 hours. BNP Invalid input(s): POCBNP D-Dimer No results for  input(s): DDIMER in the last 72 hours. Hemoglobin A1C Recent Labs    05/30/19 0733  HGBA1C 6.7*   Fasting Lipid Panel Recent Labs    05/30/19 0424  CHOL 141  HDL 49  LDLCALC 37  TRIG 277*  CHOLHDL 2.9   Thyroid Function Tests Recent Labs    05/30/19 0733  TSH 1.603   _____________  Dg Chest Portable 1 View  Result Date: 05/30/2019 CLINICAL DATA:  Chest pain EXAM: PORTABLE CHEST 1 VIEW COMPARISON:  11/27/2017 FINDINGS: The heart size and mediastinal contours are within normal limits. Both lungs are clear. The visualized skeletal structures are unremarkable. IMPRESSION: No active disease. Electronically Signed   By: Ulyses Jarred M.D.   On: 05/30/2019 04:28   Disposition   Pt is being discharged home today in good condition.  Follow-up Plans & Appointments     Discharge Instructions    Call MD for:  redness, tenderness, or signs of infection (pain, swelling, redness, odor or green/yellow discharge around incision site)   Complete by: As directed    Diet - low sodium heart healthy   Complete by: As directed    Discharge instructions   Complete by: As directed    Groin Site Care Refer to this sheet in the next few weeks. These instructions provide you with information on caring for yourself after your procedure. Your caregiver may also give you more specific instructions. Your treatment has been planned according to current medical practices, but problems sometimes occur. Call your caregiver if you have any problems or questions after your procedure. HOME CARE INSTRUCTIONS You may shower 24 hours after the procedure. Remove the bandage (dressing) and gently wash the site with plain soap and water. Gently pat the site dry.  Do not apply powder or lotion to the site.  Do not sit in a bathtub, swimming pool, or whirlpool for 5 to 7 days.  No bending, squatting, or lifting anything over 10 pounds (4.5 kg) as directed by your caregiver.  Inspect the site at least twice daily.   Do not drive home if you are discharged the same day of the procedure. Have someone else drive you.  You may drive 24 hours after the procedure unless otherwise instructed by your caregiver.  What to expect: Any bruising will usually fade within 1 to 2 weeks.  Blood that collects in the tissue (hematoma) may be painful to the touch. It should usually decrease in size and tenderness within 1 to 2 weeks.  SEEK IMMEDIATE MEDICAL CARE IF: You have unusual pain at the groin site or down the affected leg.  You have redness, warmth, swelling, or pain at the groin site.  You have drainage (other than a small amount of blood on the dressing).  You have chills.  You have a fever or persistent symptoms for more than 72 hours.  You have a fever and your symptoms suddenly get worse.  Your leg becomes pale, cool, tingly, or numb.  You have heavy bleeding from the site. Hold pressure on the site.   Increase activity slowly   Complete by: As directed        Discharge Medications     Medication List    TAKE these medications   amLODipine 10 MG tablet Commonly known as: NORVASC Take 10 mg by mouth daily.   aspirin EC 81 MG tablet Take 81 mg by mouth daily.   carvedilol 25 MG tablet Commonly known as: COREG Take 25 mg by mouth 2 (two) times daily.   colchicine 0.6 MG tablet Take 0.6 mg by mouth daily as needed (for gout).   isosorbide mononitrate 60 MG 24 hr tablet Commonly known as: IMDUR Take 1 tablet (60 mg total) by mouth daily. What changed:   medication strength  how much to take   lisinopril 10 MG tablet Commonly known as: ZESTRIL Take 1 tablet (10 mg total) by mouth daily.   magnesium oxide 400 MG tablet Commonly known as: MAG-OX Take 400 mg by mouth 2 (two) times daily.   mycophenolate 360 MG Tbec EC tablet Commonly known as: MYFORTIC Take 720 mg by mouth 2 (two) times daily.   Nitrostat 0.4 MG SL tablet Generic drug: nitroGLYCERIN Place 0.4 mg under the tongue  every 5 (five) minutes as needed for chest pain.   omeprazole 20 MG capsule Commonly known as: PRILOSEC Take 20 mg by mouth daily.   predniSONE 5 MG tablet Commonly known as: DELTASONE Take 5 mg by mouth every morning.   ranolazine 500 MG 12 hr tablet Commonly known as: RANEXA Take 500 mg by mouth 2 (two) times daily.   rosuvastatin 40 MG tablet Commonly known as: CRESTOR Take 1 tablet (40 mg total) by mouth daily at 6 PM.   sodium bicarbonate 650 MG tablet Take 1,300 mg by mouth 2 (two) times daily.   tacrolimus 1 MG capsule Commonly known as: PROGRAF Take 2-3 mg by mouth 2 (two) times daily. Take 3 mg every AM and 2 mg every PM   traMADol 50 MG tablet Commonly known as: ULTRAM Take 50-100 mg by mouth every 4 (four) hours as needed for pain.       Acute coronary syndrome (MI, NSTEMI, STEMI, etc) this admission?: No.     Outstanding Labs/Studies   N/a   Duration of Discharge Encounter   Greater than 30 minutes including physician time.  Signed, Reino Bellis NP-C 06/01/2019, 3:18 PM  Agree with note by Reino Bellis NP-C  Mr. Rahm was admitted with non-STEMI.  He does have chronic renal insufficiency.  He had prior catheterization revealing severely calcified vessels with occluded RCA, LAD and high-grade circumflex disease.  He was evaluated by T CTS and thought not to be a surgical candidate nor was he had PCI candidate.  He underwent recatheterization by Dr. Angelena Form today he found similar anatomy to prior catheterizations and was deemed stable for discharge on medical therapy.  Lorretta Harp, M.D., [place], [facility], [place] [address] [place], [place]  [address]  [phone] 06/01/2019 5:17 PM

## 2019-06-01 NOTE — Interval H&P Note (Signed)
History and Physical Interval Note:  06/01/2019 8:29 AM  Theodore Arellano  has presented today for surgery, with the diagnosis of NSTEMI.  The various methods of treatment have been discussed with the patient and family. After consideration of risks, benefits and other options for treatment, the patient has consented to  Procedure(s): LEFT HEART CATH AND CORONARY ANGIOGRAPHY (N/A) as a surgical intervention.  The patient's history has been reviewed, patient examined, no change in status, stable for surgery.  I have reviewed the patient's chart and labs.  Questions were answered to the patient's satisfaction.    Cath Lab Visit (complete for each Cath Lab visit)  Clinical Evaluation Leading to the Procedure:   ACS: Yes.    Non-ACS:    Anginal Classification: CCS III  Anti-ischemic medical therapy: Maximal Therapy (2 or more classes of medications)  Non-Invasive Test Results: No non-invasive testing performed  Prior CABG: No previous CABG        Lauree Chandler

## 2019-06-01 NOTE — Progress Notes (Signed)
Patient's HR went as low as 26, !st to 2nd Degree heart block but B/P was WNL and patient was asymptomatic, no orders received at this time, will continue to monitor.

## 2019-06-02 LAB — TACROLIMUS LEVEL
Tacrolimus (FK506) - LabCorp: 8.8 ng/mL (ref 2.0–20.0)
Tacrolimus (FK506) - LabCorp: 15.6 ng/mL (ref 2.0–20.0)

## 2019-06-05 DIAGNOSIS — I441 Atrioventricular block, second degree: Secondary | ICD-10-CM | POA: Diagnosis not present

## 2019-06-05 DIAGNOSIS — I2510 Atherosclerotic heart disease of native coronary artery without angina pectoris: Secondary | ICD-10-CM | POA: Diagnosis not present

## 2019-06-05 DIAGNOSIS — R9431 Abnormal electrocardiogram [ECG] [EKG]: Secondary | ICD-10-CM | POA: Insufficient documentation

## 2019-06-05 DIAGNOSIS — Z79899 Other long term (current) drug therapy: Secondary | ICD-10-CM | POA: Diagnosis not present

## 2019-06-05 DIAGNOSIS — E785 Hyperlipidemia, unspecified: Secondary | ICD-10-CM | POA: Diagnosis not present

## 2019-06-05 DIAGNOSIS — I25119 Atherosclerotic heart disease of native coronary artery with unspecified angina pectoris: Secondary | ICD-10-CM | POA: Diagnosis not present

## 2019-06-05 DIAGNOSIS — F17211 Nicotine dependence, cigarettes, in remission: Secondary | ICD-10-CM | POA: Diagnosis not present

## 2019-06-05 DIAGNOSIS — I10 Essential (primary) hypertension: Secondary | ICD-10-CM | POA: Diagnosis not present

## 2019-06-05 DIAGNOSIS — F17210 Nicotine dependence, cigarettes, uncomplicated: Secondary | ICD-10-CM | POA: Diagnosis not present

## 2019-06-05 DIAGNOSIS — Z940 Kidney transplant status: Secondary | ICD-10-CM | POA: Diagnosis not present

## 2019-06-11 DIAGNOSIS — I129 Hypertensive chronic kidney disease with stage 1 through stage 4 chronic kidney disease, or unspecified chronic kidney disease: Secondary | ICD-10-CM | POA: Diagnosis not present

## 2019-06-11 DIAGNOSIS — Z940 Kidney transplant status: Secondary | ICD-10-CM | POA: Diagnosis not present

## 2019-06-11 DIAGNOSIS — E785 Hyperlipidemia, unspecified: Secondary | ICD-10-CM | POA: Diagnosis not present

## 2019-06-24 ENCOUNTER — Other Ambulatory Visit: Payer: Self-pay | Admitting: Cardiology

## 2019-06-28 DIAGNOSIS — R262 Difficulty in walking, not elsewhere classified: Secondary | ICD-10-CM | POA: Diagnosis not present

## 2019-06-28 DIAGNOSIS — I214 Non-ST elevation (NSTEMI) myocardial infarction: Secondary | ICD-10-CM | POA: Diagnosis not present

## 2019-06-28 DIAGNOSIS — I25118 Atherosclerotic heart disease of native coronary artery with other forms of angina pectoris: Secondary | ICD-10-CM | POA: Diagnosis not present

## 2019-06-28 DIAGNOSIS — I132 Hypertensive heart and chronic kidney disease with heart failure and with stage 5 chronic kidney disease, or end stage renal disease: Secondary | ICD-10-CM | POA: Diagnosis not present

## 2019-06-28 DIAGNOSIS — I499 Cardiac arrhythmia, unspecified: Secondary | ICD-10-CM | POA: Diagnosis not present

## 2019-06-28 DIAGNOSIS — R739 Hyperglycemia, unspecified: Secondary | ICD-10-CM | POA: Diagnosis not present

## 2019-06-28 DIAGNOSIS — I110 Hypertensive heart disease with heart failure: Secondary | ICD-10-CM | POA: Diagnosis not present

## 2019-06-28 DIAGNOSIS — M549 Dorsalgia, unspecified: Secondary | ICD-10-CM | POA: Diagnosis not present

## 2019-06-28 DIAGNOSIS — I2510 Atherosclerotic heart disease of native coronary artery without angina pectoris: Secondary | ICD-10-CM | POA: Diagnosis not present

## 2019-06-28 DIAGNOSIS — R0602 Shortness of breath: Secondary | ICD-10-CM | POA: Diagnosis not present

## 2019-06-28 DIAGNOSIS — T8619 Other complication of kidney transplant: Secondary | ICD-10-CM | POA: Diagnosis not present

## 2019-06-28 DIAGNOSIS — D62 Acute posthemorrhagic anemia: Secondary | ICD-10-CM | POA: Diagnosis not present

## 2019-06-28 DIAGNOSIS — D72829 Elevated white blood cell count, unspecified: Secondary | ICD-10-CM | POA: Diagnosis not present

## 2019-06-28 DIAGNOSIS — Z951 Presence of aortocoronary bypass graft: Secondary | ICD-10-CM | POA: Diagnosis not present

## 2019-06-28 DIAGNOSIS — R0789 Other chest pain: Secondary | ICD-10-CM | POA: Diagnosis not present

## 2019-06-28 DIAGNOSIS — M109 Gout, unspecified: Secondary | ICD-10-CM | POA: Diagnosis not present

## 2019-06-28 DIAGNOSIS — J9811 Atelectasis: Secondary | ICD-10-CM | POA: Diagnosis not present

## 2019-06-28 DIAGNOSIS — E1122 Type 2 diabetes mellitus with diabetic chronic kidney disease: Secondary | ICD-10-CM | POA: Diagnosis not present

## 2019-06-28 DIAGNOSIS — E119 Type 2 diabetes mellitus without complications: Secondary | ICD-10-CM | POA: Diagnosis not present

## 2019-06-28 DIAGNOSIS — G629 Polyneuropathy, unspecified: Secondary | ICD-10-CM | POA: Diagnosis not present

## 2019-06-28 DIAGNOSIS — J9691 Respiratory failure, unspecified with hypoxia: Secondary | ICD-10-CM | POA: Diagnosis not present

## 2019-06-28 DIAGNOSIS — E1151 Type 2 diabetes mellitus with diabetic peripheral angiopathy without gangrene: Secondary | ICD-10-CM | POA: Diagnosis present

## 2019-06-28 DIAGNOSIS — R269 Unspecified abnormalities of gait and mobility: Secondary | ICD-10-CM | POA: Diagnosis not present

## 2019-06-28 DIAGNOSIS — I351 Nonrheumatic aortic (valve) insufficiency: Secondary | ICD-10-CM | POA: Diagnosis not present

## 2019-06-28 DIAGNOSIS — D696 Thrombocytopenia, unspecified: Secondary | ICD-10-CM | POA: Diagnosis not present

## 2019-06-28 DIAGNOSIS — I5023 Acute on chronic systolic (congestive) heart failure: Secondary | ICD-10-CM | POA: Diagnosis not present

## 2019-06-28 DIAGNOSIS — E871 Hypo-osmolality and hyponatremia: Secondary | ICD-10-CM | POA: Diagnosis not present

## 2019-06-28 DIAGNOSIS — R279 Unspecified lack of coordination: Secondary | ICD-10-CM | POA: Diagnosis not present

## 2019-06-28 DIAGNOSIS — E663 Overweight: Secondary | ICD-10-CM | POA: Diagnosis present

## 2019-06-28 DIAGNOSIS — Z7952 Long term (current) use of systemic steroids: Secondary | ICD-10-CM | POA: Diagnosis not present

## 2019-06-28 DIAGNOSIS — I441 Atrioventricular block, second degree: Secondary | ICD-10-CM | POA: Diagnosis not present

## 2019-06-28 DIAGNOSIS — R9431 Abnormal electrocardiogram [ECG] [EKG]: Secondary | ICD-10-CM | POA: Diagnosis not present

## 2019-06-28 DIAGNOSIS — M6281 Muscle weakness (generalized): Secondary | ICD-10-CM | POA: Diagnosis not present

## 2019-06-28 DIAGNOSIS — N179 Acute kidney failure, unspecified: Secondary | ICD-10-CM | POA: Diagnosis not present

## 2019-06-28 DIAGNOSIS — I739 Peripheral vascular disease, unspecified: Secondary | ICD-10-CM | POA: Diagnosis not present

## 2019-06-28 DIAGNOSIS — K5900 Constipation, unspecified: Secondary | ICD-10-CM | POA: Diagnosis not present

## 2019-06-28 DIAGNOSIS — I517 Cardiomegaly: Secondary | ICD-10-CM | POA: Diagnosis not present

## 2019-06-28 DIAGNOSIS — R5381 Other malaise: Secondary | ICD-10-CM | POA: Diagnosis not present

## 2019-06-28 DIAGNOSIS — E878 Other disorders of electrolyte and fluid balance, not elsewhere classified: Secondary | ICD-10-CM | POA: Diagnosis not present

## 2019-06-28 DIAGNOSIS — Z6828 Body mass index (BMI) 28.0-28.9, adult: Secondary | ICD-10-CM | POA: Diagnosis not present

## 2019-06-28 DIAGNOSIS — I440 Atrioventricular block, first degree: Secondary | ICD-10-CM | POA: Diagnosis not present

## 2019-06-28 DIAGNOSIS — L03116 Cellulitis of left lower limb: Secondary | ICD-10-CM | POA: Diagnosis not present

## 2019-06-28 DIAGNOSIS — I200 Unstable angina: Secondary | ICD-10-CM | POA: Diagnosis not present

## 2019-06-28 DIAGNOSIS — I480 Paroxysmal atrial fibrillation: Secondary | ICD-10-CM | POA: Diagnosis not present

## 2019-06-28 DIAGNOSIS — I25110 Atherosclerotic heart disease of native coronary artery with unstable angina pectoris: Secondary | ICD-10-CM | POA: Diagnosis present

## 2019-06-28 DIAGNOSIS — I25111 Atherosclerotic heart disease of native coronary artery with angina pectoris with documented spasm: Secondary | ICD-10-CM | POA: Diagnosis not present

## 2019-06-28 DIAGNOSIS — Z01810 Encounter for preprocedural cardiovascular examination: Secondary | ICD-10-CM | POA: Diagnosis not present

## 2019-06-28 DIAGNOSIS — E1165 Type 2 diabetes mellitus with hyperglycemia: Secondary | ICD-10-CM | POA: Diagnosis present

## 2019-06-28 DIAGNOSIS — K219 Gastro-esophageal reflux disease without esophagitis: Secondary | ICD-10-CM | POA: Diagnosis not present

## 2019-06-28 DIAGNOSIS — E785 Hyperlipidemia, unspecified: Secondary | ICD-10-CM | POA: Diagnosis not present

## 2019-06-28 DIAGNOSIS — R0902 Hypoxemia: Secondary | ICD-10-CM | POA: Diagnosis not present

## 2019-06-28 DIAGNOSIS — R001 Bradycardia, unspecified: Secondary | ICD-10-CM | POA: Diagnosis not present

## 2019-06-28 DIAGNOSIS — I083 Combined rheumatic disorders of mitral, aortic and tricuspid valves: Secondary | ICD-10-CM | POA: Diagnosis not present

## 2019-06-28 DIAGNOSIS — Z95818 Presence of other cardiac implants and grafts: Secondary | ICD-10-CM | POA: Diagnosis not present

## 2019-06-28 DIAGNOSIS — M79605 Pain in left leg: Secondary | ICD-10-CM | POA: Diagnosis not present

## 2019-06-28 DIAGNOSIS — I5020 Unspecified systolic (congestive) heart failure: Secondary | ICD-10-CM | POA: Diagnosis not present

## 2019-06-28 DIAGNOSIS — I97790 Other intraoperative cardiac functional disturbances during cardiac surgery: Secondary | ICD-10-CM | POA: Diagnosis not present

## 2019-06-28 DIAGNOSIS — E8770 Fluid overload, unspecified: Secondary | ICD-10-CM | POA: Diagnosis not present

## 2019-06-28 DIAGNOSIS — M7989 Other specified soft tissue disorders: Secondary | ICD-10-CM | POA: Diagnosis not present

## 2019-06-28 DIAGNOSIS — Z940 Kidney transplant status: Secondary | ICD-10-CM | POA: Diagnosis not present

## 2019-06-28 DIAGNOSIS — N1330 Unspecified hydronephrosis: Secondary | ICD-10-CM | POA: Diagnosis not present

## 2019-06-28 DIAGNOSIS — Z20828 Contact with and (suspected) exposure to other viral communicable diseases: Secondary | ICD-10-CM | POA: Diagnosis present

## 2019-06-28 DIAGNOSIS — R072 Precordial pain: Secondary | ICD-10-CM | POA: Diagnosis not present

## 2019-06-28 DIAGNOSIS — I4891 Unspecified atrial fibrillation: Secondary | ICD-10-CM | POA: Diagnosis not present

## 2019-06-28 DIAGNOSIS — I10 Essential (primary) hypertension: Secondary | ICD-10-CM | POA: Diagnosis not present

## 2019-06-28 DIAGNOSIS — Z741 Need for assistance with personal care: Secondary | ICD-10-CM | POA: Diagnosis not present

## 2019-06-28 DIAGNOSIS — N186 End stage renal disease: Secondary | ICD-10-CM | POA: Diagnosis not present

## 2019-06-28 DIAGNOSIS — Z743 Need for continuous supervision: Secondary | ICD-10-CM | POA: Diagnosis not present

## 2019-06-28 DIAGNOSIS — I25810 Atherosclerosis of coronary artery bypass graft(s) without angina pectoris: Secondary | ICD-10-CM | POA: Diagnosis not present

## 2019-06-28 DIAGNOSIS — Z978 Presence of other specified devices: Secondary | ICD-10-CM | POA: Diagnosis not present

## 2019-06-28 DIAGNOSIS — J810 Acute pulmonary edema: Secondary | ICD-10-CM | POA: Diagnosis not present

## 2019-06-28 DIAGNOSIS — I959 Hypotension, unspecified: Secondary | ICD-10-CM | POA: Diagnosis not present

## 2019-06-28 DIAGNOSIS — D509 Iron deficiency anemia, unspecified: Secondary | ICD-10-CM | POA: Diagnosis not present

## 2019-06-28 DIAGNOSIS — F419 Anxiety disorder, unspecified: Secondary | ICD-10-CM | POA: Diagnosis not present

## 2019-06-28 DIAGNOSIS — E872 Acidosis: Secondary | ICD-10-CM | POA: Diagnosis not present

## 2019-06-28 DIAGNOSIS — E861 Hypovolemia: Secondary | ICD-10-CM | POA: Diagnosis not present

## 2019-06-28 DIAGNOSIS — I255 Ischemic cardiomyopathy: Secondary | ICD-10-CM | POA: Diagnosis present

## 2019-06-28 DIAGNOSIS — J9601 Acute respiratory failure with hypoxia: Secondary | ICD-10-CM | POA: Diagnosis not present

## 2019-06-28 DIAGNOSIS — Z9225 Personal history of immunosupression therapy: Secondary | ICD-10-CM | POA: Diagnosis not present

## 2019-06-28 DIAGNOSIS — Z79899 Other long term (current) drug therapy: Secondary | ICD-10-CM | POA: Diagnosis not present

## 2019-06-28 DIAGNOSIS — R600 Localized edema: Secondary | ICD-10-CM | POA: Diagnosis not present

## 2019-06-28 DIAGNOSIS — R278 Other lack of coordination: Secondary | ICD-10-CM | POA: Diagnosis not present

## 2019-07-10 DIAGNOSIS — I998 Other disorder of circulatory system: Secondary | ICD-10-CM | POA: Diagnosis not present

## 2019-07-10 DIAGNOSIS — I70209 Unspecified atherosclerosis of native arteries of extremities, unspecified extremity: Secondary | ICD-10-CM | POA: Diagnosis not present

## 2019-07-10 DIAGNOSIS — R262 Difficulty in walking, not elsewhere classified: Secondary | ICD-10-CM | POA: Diagnosis not present

## 2019-07-10 DIAGNOSIS — M109 Gout, unspecified: Secondary | ICD-10-CM | POA: Diagnosis not present

## 2019-07-10 DIAGNOSIS — T8619 Other complication of kidney transplant: Secondary | ICD-10-CM | POA: Diagnosis not present

## 2019-07-10 DIAGNOSIS — I771 Stricture of artery: Secondary | ICD-10-CM | POA: Diagnosis not present

## 2019-07-10 DIAGNOSIS — E119 Type 2 diabetes mellitus without complications: Secondary | ICD-10-CM | POA: Diagnosis not present

## 2019-07-10 DIAGNOSIS — Z91013 Allergy to seafood: Secondary | ICD-10-CM | POA: Diagnosis not present

## 2019-07-10 DIAGNOSIS — M6281 Muscle weakness (generalized): Secondary | ICD-10-CM | POA: Diagnosis not present

## 2019-07-10 DIAGNOSIS — M1712 Unilateral primary osteoarthritis, left knee: Secondary | ICD-10-CM | POA: Diagnosis not present

## 2019-07-10 DIAGNOSIS — L03116 Cellulitis of left lower limb: Secondary | ICD-10-CM | POA: Diagnosis not present

## 2019-07-10 DIAGNOSIS — N186 End stage renal disease: Secondary | ICD-10-CM | POA: Diagnosis not present

## 2019-07-10 DIAGNOSIS — E785 Hyperlipidemia, unspecified: Secondary | ICD-10-CM | POA: Diagnosis not present

## 2019-07-10 DIAGNOSIS — Z7952 Long term (current) use of systemic steroids: Secondary | ICD-10-CM | POA: Diagnosis not present

## 2019-07-10 DIAGNOSIS — I10 Essential (primary) hypertension: Secondary | ICD-10-CM | POA: Diagnosis not present

## 2019-07-10 DIAGNOSIS — M79605 Pain in left leg: Secondary | ICD-10-CM | POA: Diagnosis not present

## 2019-07-10 DIAGNOSIS — R279 Unspecified lack of coordination: Secondary | ICD-10-CM | POA: Diagnosis not present

## 2019-07-10 DIAGNOSIS — M79662 Pain in left lower leg: Secondary | ICD-10-CM | POA: Diagnosis not present

## 2019-07-10 DIAGNOSIS — R2242 Localized swelling, mass and lump, left lower limb: Secondary | ICD-10-CM | POA: Diagnosis not present

## 2019-07-10 DIAGNOSIS — I440 Atrioventricular block, first degree: Secondary | ICD-10-CM | POA: Diagnosis not present

## 2019-07-10 DIAGNOSIS — R5381 Other malaise: Secondary | ICD-10-CM | POA: Diagnosis not present

## 2019-07-10 DIAGNOSIS — Z7902 Long term (current) use of antithrombotics/antiplatelets: Secondary | ICD-10-CM | POA: Diagnosis not present

## 2019-07-10 DIAGNOSIS — I25810 Atherosclerosis of coronary artery bypass graft(s) without angina pectoris: Secondary | ICD-10-CM | POA: Diagnosis not present

## 2019-07-10 DIAGNOSIS — I70222 Atherosclerosis of native arteries of extremities with rest pain, left leg: Secondary | ICD-10-CM | POA: Diagnosis present

## 2019-07-10 DIAGNOSIS — Z8249 Family history of ischemic heart disease and other diseases of the circulatory system: Secondary | ICD-10-CM | POA: Diagnosis not present

## 2019-07-10 DIAGNOSIS — F17210 Nicotine dependence, cigarettes, uncomplicated: Secondary | ICD-10-CM | POA: Diagnosis present

## 2019-07-10 DIAGNOSIS — I739 Peripheral vascular disease, unspecified: Secondary | ICD-10-CM | POA: Diagnosis not present

## 2019-07-10 DIAGNOSIS — R072 Precordial pain: Secondary | ICD-10-CM | POA: Diagnosis not present

## 2019-07-10 DIAGNOSIS — K219 Gastro-esophageal reflux disease without esophagitis: Secondary | ICD-10-CM | POA: Diagnosis not present

## 2019-07-10 DIAGNOSIS — D509 Iron deficiency anemia, unspecified: Secondary | ICD-10-CM | POA: Diagnosis not present

## 2019-07-10 DIAGNOSIS — I708 Atherosclerosis of other arteries: Secondary | ICD-10-CM | POA: Diagnosis present

## 2019-07-10 DIAGNOSIS — Z79899 Other long term (current) drug therapy: Secondary | ICD-10-CM | POA: Diagnosis not present

## 2019-07-10 DIAGNOSIS — Z885 Allergy status to narcotic agent status: Secondary | ICD-10-CM | POA: Diagnosis not present

## 2019-07-10 DIAGNOSIS — I200 Unstable angina: Secondary | ICD-10-CM | POA: Diagnosis not present

## 2019-07-10 DIAGNOSIS — R9431 Abnormal electrocardiogram [ECG] [EKG]: Secondary | ICD-10-CM | POA: Diagnosis not present

## 2019-07-10 DIAGNOSIS — Z833 Family history of diabetes mellitus: Secondary | ICD-10-CM | POA: Diagnosis not present

## 2019-07-10 DIAGNOSIS — Z20828 Contact with and (suspected) exposure to other viral communicable diseases: Secondary | ICD-10-CM | POA: Diagnosis present

## 2019-07-10 DIAGNOSIS — Z743 Need for continuous supervision: Secondary | ICD-10-CM | POA: Diagnosis not present

## 2019-07-10 DIAGNOSIS — R278 Other lack of coordination: Secondary | ICD-10-CM | POA: Diagnosis not present

## 2019-07-10 DIAGNOSIS — E871 Hypo-osmolality and hyponatremia: Secondary | ICD-10-CM | POA: Diagnosis not present

## 2019-07-10 DIAGNOSIS — Z951 Presence of aortocoronary bypass graft: Secondary | ICD-10-CM | POA: Diagnosis not present

## 2019-07-10 DIAGNOSIS — F419 Anxiety disorder, unspecified: Secondary | ICD-10-CM | POA: Diagnosis not present

## 2019-07-10 DIAGNOSIS — G629 Polyneuropathy, unspecified: Secondary | ICD-10-CM | POA: Diagnosis not present

## 2019-07-10 DIAGNOSIS — Z741 Need for assistance with personal care: Secondary | ICD-10-CM | POA: Diagnosis not present

## 2019-07-10 DIAGNOSIS — N179 Acute kidney failure, unspecified: Secondary | ICD-10-CM | POA: Diagnosis not present

## 2019-07-10 DIAGNOSIS — N1330 Unspecified hydronephrosis: Secondary | ICD-10-CM | POA: Diagnosis not present

## 2019-07-10 DIAGNOSIS — R579 Shock, unspecified: Secondary | ICD-10-CM | POA: Diagnosis not present

## 2019-07-10 DIAGNOSIS — L539 Erythematous condition, unspecified: Secondary | ICD-10-CM | POA: Diagnosis not present

## 2019-07-10 DIAGNOSIS — R269 Unspecified abnormalities of gait and mobility: Secondary | ICD-10-CM | POA: Diagnosis not present

## 2019-07-10 DIAGNOSIS — K5900 Constipation, unspecified: Secondary | ICD-10-CM | POA: Diagnosis not present

## 2019-07-10 DIAGNOSIS — I2510 Atherosclerotic heart disease of native coronary artery without angina pectoris: Secondary | ICD-10-CM | POA: Diagnosis present

## 2019-07-10 DIAGNOSIS — E1151 Type 2 diabetes mellitus with diabetic peripheral angiopathy without gangrene: Secondary | ICD-10-CM | POA: Diagnosis present

## 2019-07-10 DIAGNOSIS — Z7982 Long term (current) use of aspirin: Secondary | ICD-10-CM | POA: Diagnosis not present

## 2019-07-10 DIAGNOSIS — Z9225 Personal history of immunosupression therapy: Secondary | ICD-10-CM | POA: Diagnosis not present

## 2019-07-10 DIAGNOSIS — Z940 Kidney transplant status: Secondary | ICD-10-CM | POA: Diagnosis not present

## 2019-07-10 DIAGNOSIS — R52 Pain, unspecified: Secondary | ICD-10-CM | POA: Diagnosis not present

## 2019-07-13 DIAGNOSIS — I2510 Atherosclerotic heart disease of native coronary artery without angina pectoris: Secondary | ICD-10-CM | POA: Diagnosis not present

## 2019-07-13 DIAGNOSIS — N1330 Unspecified hydronephrosis: Secondary | ICD-10-CM | POA: Diagnosis not present

## 2019-07-13 DIAGNOSIS — N179 Acute kidney failure, unspecified: Secondary | ICD-10-CM | POA: Diagnosis not present

## 2019-07-13 DIAGNOSIS — M109 Gout, unspecified: Secondary | ICD-10-CM | POA: Diagnosis not present

## 2019-07-20 DIAGNOSIS — N179 Acute kidney failure, unspecified: Secondary | ICD-10-CM | POA: Diagnosis not present

## 2019-07-20 DIAGNOSIS — I2510 Atherosclerotic heart disease of native coronary artery without angina pectoris: Secondary | ICD-10-CM | POA: Diagnosis not present

## 2019-07-20 DIAGNOSIS — M109 Gout, unspecified: Secondary | ICD-10-CM | POA: Diagnosis not present

## 2019-07-20 DIAGNOSIS — N1330 Unspecified hydronephrosis: Secondary | ICD-10-CM | POA: Diagnosis not present

## 2019-07-21 DIAGNOSIS — I771 Stricture of artery: Secondary | ICD-10-CM | POA: Diagnosis not present

## 2019-07-21 DIAGNOSIS — Z833 Family history of diabetes mellitus: Secondary | ICD-10-CM | POA: Diagnosis not present

## 2019-07-21 DIAGNOSIS — L03116 Cellulitis of left lower limb: Secondary | ICD-10-CM | POA: Diagnosis not present

## 2019-07-21 DIAGNOSIS — E1151 Type 2 diabetes mellitus with diabetic peripheral angiopathy without gangrene: Secondary | ICD-10-CM | POA: Diagnosis present

## 2019-07-21 DIAGNOSIS — I70209 Unspecified atherosclerosis of native arteries of extremities, unspecified extremity: Secondary | ICD-10-CM | POA: Diagnosis not present

## 2019-07-21 DIAGNOSIS — F17210 Nicotine dependence, cigarettes, uncomplicated: Secondary | ICD-10-CM | POA: Diagnosis present

## 2019-07-21 DIAGNOSIS — Z743 Need for continuous supervision: Secondary | ICD-10-CM | POA: Diagnosis not present

## 2019-07-21 DIAGNOSIS — Z01818 Encounter for other preprocedural examination: Secondary | ICD-10-CM | POA: Diagnosis not present

## 2019-07-21 DIAGNOSIS — E119 Type 2 diabetes mellitus without complications: Secondary | ICD-10-CM | POA: Diagnosis not present

## 2019-07-21 DIAGNOSIS — F419 Anxiety disorder, unspecified: Secondary | ICD-10-CM | POA: Diagnosis not present

## 2019-07-21 DIAGNOSIS — R579 Shock, unspecified: Secondary | ICD-10-CM | POA: Diagnosis not present

## 2019-07-21 DIAGNOSIS — Z741 Need for assistance with personal care: Secondary | ICD-10-CM | POA: Diagnosis not present

## 2019-07-21 DIAGNOSIS — N189 Chronic kidney disease, unspecified: Secondary | ICD-10-CM | POA: Diagnosis not present

## 2019-07-21 DIAGNOSIS — I708 Atherosclerosis of other arteries: Secondary | ICD-10-CM | POA: Diagnosis present

## 2019-07-21 DIAGNOSIS — N1330 Unspecified hydronephrosis: Secondary | ICD-10-CM | POA: Diagnosis not present

## 2019-07-21 DIAGNOSIS — I440 Atrioventricular block, first degree: Secondary | ICD-10-CM | POA: Diagnosis not present

## 2019-07-21 DIAGNOSIS — I70203 Unspecified atherosclerosis of native arteries of extremities, bilateral legs: Secondary | ICD-10-CM | POA: Diagnosis not present

## 2019-07-21 DIAGNOSIS — R262 Difficulty in walking, not elsewhere classified: Secondary | ICD-10-CM | POA: Diagnosis not present

## 2019-07-21 DIAGNOSIS — R5381 Other malaise: Secondary | ICD-10-CM | POA: Diagnosis not present

## 2019-07-21 DIAGNOSIS — N179 Acute kidney failure, unspecified: Secondary | ICD-10-CM | POA: Diagnosis not present

## 2019-07-21 DIAGNOSIS — M6281 Muscle weakness (generalized): Secondary | ICD-10-CM | POA: Diagnosis not present

## 2019-07-21 DIAGNOSIS — I998 Other disorder of circulatory system: Secondary | ICD-10-CM | POA: Diagnosis not present

## 2019-07-21 DIAGNOSIS — R279 Unspecified lack of coordination: Secondary | ICD-10-CM | POA: Diagnosis not present

## 2019-07-21 DIAGNOSIS — I779 Disorder of arteries and arterioles, unspecified: Secondary | ICD-10-CM | POA: Diagnosis not present

## 2019-07-21 DIAGNOSIS — Z8249 Family history of ischemic heart disease and other diseases of the circulatory system: Secondary | ICD-10-CM | POA: Diagnosis not present

## 2019-07-21 DIAGNOSIS — N186 End stage renal disease: Secondary | ICD-10-CM | POA: Diagnosis not present

## 2019-07-21 DIAGNOSIS — M79662 Pain in left lower leg: Secondary | ICD-10-CM | POA: Diagnosis not present

## 2019-07-21 DIAGNOSIS — R52 Pain, unspecified: Secondary | ICD-10-CM | POA: Diagnosis not present

## 2019-07-21 DIAGNOSIS — K5900 Constipation, unspecified: Secondary | ICD-10-CM | POA: Diagnosis not present

## 2019-07-21 DIAGNOSIS — Z940 Kidney transplant status: Secondary | ICD-10-CM | POA: Diagnosis not present

## 2019-07-21 DIAGNOSIS — Z7902 Long term (current) use of antithrombotics/antiplatelets: Secondary | ICD-10-CM | POA: Diagnosis not present

## 2019-07-21 DIAGNOSIS — G629 Polyneuropathy, unspecified: Secondary | ICD-10-CM | POA: Diagnosis not present

## 2019-07-21 DIAGNOSIS — I7789 Other specified disorders of arteries and arterioles: Secondary | ICD-10-CM | POA: Diagnosis not present

## 2019-07-21 DIAGNOSIS — K219 Gastro-esophageal reflux disease without esophagitis: Secondary | ICD-10-CM | POA: Diagnosis not present

## 2019-07-21 DIAGNOSIS — R269 Unspecified abnormalities of gait and mobility: Secondary | ICD-10-CM | POA: Diagnosis not present

## 2019-07-21 DIAGNOSIS — I129 Hypertensive chronic kidney disease with stage 1 through stage 4 chronic kidney disease, or unspecified chronic kidney disease: Secondary | ICD-10-CM | POA: Diagnosis not present

## 2019-07-21 DIAGNOSIS — Z7952 Long term (current) use of systemic steroids: Secondary | ICD-10-CM | POA: Diagnosis not present

## 2019-07-21 DIAGNOSIS — E1122 Type 2 diabetes mellitus with diabetic chronic kidney disease: Secondary | ICD-10-CM | POA: Diagnosis not present

## 2019-07-21 DIAGNOSIS — M79605 Pain in left leg: Secondary | ICD-10-CM | POA: Diagnosis not present

## 2019-07-21 DIAGNOSIS — M109 Gout, unspecified: Secondary | ICD-10-CM | POA: Diagnosis not present

## 2019-07-21 DIAGNOSIS — R278 Other lack of coordination: Secondary | ICD-10-CM | POA: Diagnosis not present

## 2019-07-21 DIAGNOSIS — Z7982 Long term (current) use of aspirin: Secondary | ICD-10-CM | POA: Diagnosis not present

## 2019-07-21 DIAGNOSIS — I739 Peripheral vascular disease, unspecified: Secondary | ICD-10-CM | POA: Diagnosis not present

## 2019-07-21 DIAGNOSIS — Z79899 Other long term (current) drug therapy: Secondary | ICD-10-CM | POA: Diagnosis not present

## 2019-07-21 DIAGNOSIS — Z20828 Contact with and (suspected) exposure to other viral communicable diseases: Secondary | ICD-10-CM | POA: Diagnosis present

## 2019-07-21 DIAGNOSIS — E785 Hyperlipidemia, unspecified: Secondary | ICD-10-CM | POA: Diagnosis present

## 2019-07-21 DIAGNOSIS — I10 Essential (primary) hypertension: Secondary | ICD-10-CM | POA: Diagnosis not present

## 2019-07-21 DIAGNOSIS — I70222 Atherosclerosis of native arteries of extremities with rest pain, left leg: Secondary | ICD-10-CM | POA: Diagnosis not present

## 2019-07-21 DIAGNOSIS — R9431 Abnormal electrocardiogram [ECG] [EKG]: Secondary | ICD-10-CM | POA: Diagnosis not present

## 2019-07-21 DIAGNOSIS — Z91013 Allergy to seafood: Secondary | ICD-10-CM | POA: Diagnosis not present

## 2019-07-21 DIAGNOSIS — T8619 Other complication of kidney transplant: Secondary | ICD-10-CM | POA: Diagnosis not present

## 2019-07-21 DIAGNOSIS — Z951 Presence of aortocoronary bypass graft: Secondary | ICD-10-CM | POA: Diagnosis not present

## 2019-07-21 DIAGNOSIS — I2510 Atherosclerotic heart disease of native coronary artery without angina pectoris: Secondary | ICD-10-CM | POA: Diagnosis not present

## 2019-07-21 DIAGNOSIS — Z885 Allergy status to narcotic agent status: Secondary | ICD-10-CM | POA: Diagnosis not present

## 2019-07-21 DIAGNOSIS — I25810 Atherosclerosis of coronary artery bypass graft(s) without angina pectoris: Secondary | ICD-10-CM | POA: Diagnosis not present

## 2019-07-21 DIAGNOSIS — E871 Hypo-osmolality and hyponatremia: Secondary | ICD-10-CM | POA: Diagnosis not present

## 2019-07-21 DIAGNOSIS — S81802A Unspecified open wound, left lower leg, initial encounter: Secondary | ICD-10-CM | POA: Insufficient documentation

## 2019-07-21 DIAGNOSIS — L539 Erythematous condition, unspecified: Secondary | ICD-10-CM | POA: Diagnosis not present

## 2019-07-21 DIAGNOSIS — R072 Precordial pain: Secondary | ICD-10-CM | POA: Diagnosis not present

## 2019-07-21 DIAGNOSIS — D509 Iron deficiency anemia, unspecified: Secondary | ICD-10-CM | POA: Diagnosis not present

## 2019-07-21 DIAGNOSIS — Z9225 Personal history of immunosupression therapy: Secondary | ICD-10-CM | POA: Diagnosis not present

## 2019-07-21 DIAGNOSIS — L988 Other specified disorders of the skin and subcutaneous tissue: Secondary | ICD-10-CM | POA: Diagnosis not present

## 2019-07-28 DIAGNOSIS — R072 Precordial pain: Secondary | ICD-10-CM | POA: Diagnosis not present

## 2019-07-28 DIAGNOSIS — F17210 Nicotine dependence, cigarettes, uncomplicated: Secondary | ICD-10-CM | POA: Diagnosis not present

## 2019-07-28 DIAGNOSIS — S81802A Unspecified open wound, left lower leg, initial encounter: Secondary | ICD-10-CM | POA: Diagnosis not present

## 2019-07-28 DIAGNOSIS — N179 Acute kidney failure, unspecified: Secondary | ICD-10-CM | POA: Diagnosis not present

## 2019-07-28 DIAGNOSIS — M6281 Muscle weakness (generalized): Secondary | ICD-10-CM | POA: Diagnosis not present

## 2019-07-28 DIAGNOSIS — E785 Hyperlipidemia, unspecified: Secondary | ICD-10-CM | POA: Diagnosis not present

## 2019-07-28 DIAGNOSIS — Z741 Need for assistance with personal care: Secondary | ICD-10-CM | POA: Diagnosis not present

## 2019-07-28 DIAGNOSIS — I10 Essential (primary) hypertension: Secondary | ICD-10-CM | POA: Diagnosis not present

## 2019-07-28 DIAGNOSIS — I25810 Atherosclerosis of coronary artery bypass graft(s) without angina pectoris: Secondary | ICD-10-CM | POA: Diagnosis not present

## 2019-07-28 DIAGNOSIS — R279 Unspecified lack of coordination: Secondary | ICD-10-CM | POA: Diagnosis not present

## 2019-07-28 DIAGNOSIS — E119 Type 2 diabetes mellitus without complications: Secondary | ICD-10-CM | POA: Diagnosis not present

## 2019-07-28 DIAGNOSIS — I779 Disorder of arteries and arterioles, unspecified: Secondary | ICD-10-CM | POA: Diagnosis not present

## 2019-07-28 DIAGNOSIS — I4891 Unspecified atrial fibrillation: Secondary | ICD-10-CM | POA: Diagnosis not present

## 2019-07-28 DIAGNOSIS — F419 Anxiety disorder, unspecified: Secondary | ICD-10-CM | POA: Diagnosis not present

## 2019-07-28 DIAGNOSIS — R278 Other lack of coordination: Secondary | ICD-10-CM | POA: Diagnosis not present

## 2019-07-28 DIAGNOSIS — K5900 Constipation, unspecified: Secondary | ICD-10-CM | POA: Diagnosis not present

## 2019-07-28 DIAGNOSIS — I2510 Atherosclerotic heart disease of native coronary artery without angina pectoris: Secondary | ICD-10-CM | POA: Diagnosis not present

## 2019-07-28 DIAGNOSIS — I739 Peripheral vascular disease, unspecified: Secondary | ICD-10-CM | POA: Diagnosis not present

## 2019-07-28 DIAGNOSIS — M109 Gout, unspecified: Secondary | ICD-10-CM | POA: Diagnosis not present

## 2019-07-28 DIAGNOSIS — D631 Anemia in chronic kidney disease: Secondary | ICD-10-CM | POA: Diagnosis not present

## 2019-07-28 DIAGNOSIS — Z9225 Personal history of immunosupression therapy: Secondary | ICD-10-CM | POA: Diagnosis not present

## 2019-07-28 DIAGNOSIS — D649 Anemia, unspecified: Secondary | ICD-10-CM | POA: Diagnosis not present

## 2019-07-28 DIAGNOSIS — Z743 Need for continuous supervision: Secondary | ICD-10-CM | POA: Diagnosis not present

## 2019-07-28 DIAGNOSIS — N1330 Unspecified hydronephrosis: Secondary | ICD-10-CM | POA: Diagnosis not present

## 2019-07-28 DIAGNOSIS — G629 Polyneuropathy, unspecified: Secondary | ICD-10-CM | POA: Diagnosis not present

## 2019-07-28 DIAGNOSIS — Z720 Tobacco use: Secondary | ICD-10-CM | POA: Diagnosis not present

## 2019-07-28 DIAGNOSIS — R262 Difficulty in walking, not elsewhere classified: Secondary | ICD-10-CM | POA: Diagnosis not present

## 2019-07-28 DIAGNOSIS — R269 Unspecified abnormalities of gait and mobility: Secondary | ICD-10-CM | POA: Diagnosis not present

## 2019-07-28 DIAGNOSIS — L03116 Cellulitis of left lower limb: Secondary | ICD-10-CM | POA: Diagnosis not present

## 2019-07-28 DIAGNOSIS — Z79899 Other long term (current) drug therapy: Secondary | ICD-10-CM | POA: Diagnosis not present

## 2019-07-28 DIAGNOSIS — Z9889 Other specified postprocedural states: Secondary | ICD-10-CM | POA: Diagnosis not present

## 2019-07-28 DIAGNOSIS — Z951 Presence of aortocoronary bypass graft: Secondary | ICD-10-CM | POA: Diagnosis not present

## 2019-07-28 DIAGNOSIS — I129 Hypertensive chronic kidney disease with stage 1 through stage 4 chronic kidney disease, or unspecified chronic kidney disease: Secondary | ICD-10-CM | POA: Diagnosis not present

## 2019-07-28 DIAGNOSIS — R5381 Other malaise: Secondary | ICD-10-CM | POA: Diagnosis not present

## 2019-07-28 DIAGNOSIS — N186 End stage renal disease: Secondary | ICD-10-CM | POA: Diagnosis not present

## 2019-07-28 DIAGNOSIS — N189 Chronic kidney disease, unspecified: Secondary | ICD-10-CM | POA: Diagnosis not present

## 2019-07-28 DIAGNOSIS — D509 Iron deficiency anemia, unspecified: Secondary | ICD-10-CM | POA: Diagnosis not present

## 2019-07-28 DIAGNOSIS — K219 Gastro-esophageal reflux disease without esophagitis: Secondary | ICD-10-CM | POA: Diagnosis not present

## 2019-07-28 DIAGNOSIS — Z940 Kidney transplant status: Secondary | ICD-10-CM | POA: Diagnosis not present

## 2019-07-28 DIAGNOSIS — N2581 Secondary hyperparathyroidism of renal origin: Secondary | ICD-10-CM | POA: Diagnosis not present

## 2019-07-30 DIAGNOSIS — N179 Acute kidney failure, unspecified: Secondary | ICD-10-CM | POA: Diagnosis not present

## 2019-07-30 DIAGNOSIS — I2510 Atherosclerotic heart disease of native coronary artery without angina pectoris: Secondary | ICD-10-CM | POA: Diagnosis not present

## 2019-07-30 DIAGNOSIS — I779 Disorder of arteries and arterioles, unspecified: Secondary | ICD-10-CM | POA: Diagnosis not present

## 2019-07-30 DIAGNOSIS — M109 Gout, unspecified: Secondary | ICD-10-CM | POA: Diagnosis not present

## 2019-08-04 DIAGNOSIS — I4891 Unspecified atrial fibrillation: Secondary | ICD-10-CM | POA: Diagnosis not present

## 2019-08-04 DIAGNOSIS — I10 Essential (primary) hypertension: Secondary | ICD-10-CM | POA: Diagnosis not present

## 2019-08-04 DIAGNOSIS — I2510 Atherosclerotic heart disease of native coronary artery without angina pectoris: Secondary | ICD-10-CM | POA: Diagnosis not present

## 2019-08-04 DIAGNOSIS — Z951 Presence of aortocoronary bypass graft: Secondary | ICD-10-CM | POA: Diagnosis not present

## 2019-08-04 DIAGNOSIS — F17210 Nicotine dependence, cigarettes, uncomplicated: Secondary | ICD-10-CM | POA: Diagnosis not present

## 2019-08-04 DIAGNOSIS — Z79899 Other long term (current) drug therapy: Secondary | ICD-10-CM | POA: Diagnosis not present

## 2019-08-04 DIAGNOSIS — D649 Anemia, unspecified: Secondary | ICD-10-CM | POA: Diagnosis not present

## 2019-08-04 DIAGNOSIS — Z940 Kidney transplant status: Secondary | ICD-10-CM | POA: Diagnosis not present

## 2019-08-10 DIAGNOSIS — D631 Anemia in chronic kidney disease: Secondary | ICD-10-CM | POA: Diagnosis not present

## 2019-08-10 DIAGNOSIS — Z720 Tobacco use: Secondary | ICD-10-CM | POA: Diagnosis not present

## 2019-08-10 DIAGNOSIS — Z951 Presence of aortocoronary bypass graft: Secondary | ICD-10-CM | POA: Diagnosis not present

## 2019-08-10 DIAGNOSIS — E785 Hyperlipidemia, unspecified: Secondary | ICD-10-CM | POA: Diagnosis not present

## 2019-08-10 DIAGNOSIS — Z9889 Other specified postprocedural states: Secondary | ICD-10-CM | POA: Diagnosis not present

## 2019-08-10 DIAGNOSIS — I129 Hypertensive chronic kidney disease with stage 1 through stage 4 chronic kidney disease, or unspecified chronic kidney disease: Secondary | ICD-10-CM | POA: Diagnosis not present

## 2019-08-10 DIAGNOSIS — N2581 Secondary hyperparathyroidism of renal origin: Secondary | ICD-10-CM | POA: Diagnosis not present

## 2019-08-10 DIAGNOSIS — Z940 Kidney transplant status: Secondary | ICD-10-CM | POA: Diagnosis not present

## 2019-08-19 DIAGNOSIS — I779 Disorder of arteries and arterioles, unspecified: Secondary | ICD-10-CM | POA: Diagnosis not present

## 2019-08-21 DIAGNOSIS — E119 Type 2 diabetes mellitus without complications: Secondary | ICD-10-CM | POA: Diagnosis not present

## 2019-08-21 DIAGNOSIS — I10 Essential (primary) hypertension: Secondary | ICD-10-CM | POA: Diagnosis not present

## 2019-08-21 DIAGNOSIS — I2510 Atherosclerotic heart disease of native coronary artery without angina pectoris: Secondary | ICD-10-CM | POA: Diagnosis not present

## 2019-08-23 DIAGNOSIS — M109 Gout, unspecified: Secondary | ICD-10-CM | POA: Diagnosis not present

## 2019-08-23 DIAGNOSIS — Z7952 Long term (current) use of systemic steroids: Secondary | ICD-10-CM | POA: Diagnosis not present

## 2019-08-23 DIAGNOSIS — Z7902 Long term (current) use of antithrombotics/antiplatelets: Secondary | ICD-10-CM | POA: Diagnosis not present

## 2019-08-23 DIAGNOSIS — Z940 Kidney transplant status: Secondary | ICD-10-CM | POA: Diagnosis not present

## 2019-08-23 DIAGNOSIS — L03116 Cellulitis of left lower limb: Secondary | ICD-10-CM | POA: Diagnosis not present

## 2019-08-23 DIAGNOSIS — I10 Essential (primary) hypertension: Secondary | ICD-10-CM | POA: Diagnosis not present

## 2019-08-23 DIAGNOSIS — E1151 Type 2 diabetes mellitus with diabetic peripheral angiopathy without gangrene: Secondary | ICD-10-CM | POA: Diagnosis not present

## 2019-08-23 DIAGNOSIS — Z48812 Encounter for surgical aftercare following surgery on the circulatory system: Secondary | ICD-10-CM | POA: Diagnosis not present

## 2019-08-23 DIAGNOSIS — E1142 Type 2 diabetes mellitus with diabetic polyneuropathy: Secondary | ICD-10-CM | POA: Diagnosis not present

## 2019-08-23 DIAGNOSIS — I25810 Atherosclerosis of coronary artery bypass graft(s) without angina pectoris: Secondary | ICD-10-CM | POA: Diagnosis not present

## 2019-08-23 DIAGNOSIS — Z951 Presence of aortocoronary bypass graft: Secondary | ICD-10-CM | POA: Diagnosis not present

## 2019-08-23 DIAGNOSIS — E785 Hyperlipidemia, unspecified: Secondary | ICD-10-CM | POA: Diagnosis not present

## 2019-08-23 DIAGNOSIS — K219 Gastro-esophageal reflux disease without esophagitis: Secondary | ICD-10-CM | POA: Diagnosis not present

## 2019-08-24 DIAGNOSIS — Z20828 Contact with and (suspected) exposure to other viral communicable diseases: Secondary | ICD-10-CM | POA: Diagnosis not present

## 2019-08-28 DIAGNOSIS — I739 Peripheral vascular disease, unspecified: Secondary | ICD-10-CM | POA: Diagnosis not present

## 2019-08-28 DIAGNOSIS — I2510 Atherosclerotic heart disease of native coronary artery without angina pectoris: Secondary | ICD-10-CM | POA: Diagnosis not present

## 2019-09-02 DIAGNOSIS — I10 Essential (primary) hypertension: Secondary | ICD-10-CM | POA: Diagnosis not present

## 2019-09-02 DIAGNOSIS — I2510 Atherosclerotic heart disease of native coronary artery without angina pectoris: Secondary | ICD-10-CM | POA: Diagnosis not present

## 2019-09-02 DIAGNOSIS — I739 Peripheral vascular disease, unspecified: Secondary | ICD-10-CM | POA: Diagnosis not present

## 2019-09-02 DIAGNOSIS — E119 Type 2 diabetes mellitus without complications: Secondary | ICD-10-CM | POA: Diagnosis not present

## 2019-09-04 DIAGNOSIS — Z951 Presence of aortocoronary bypass graft: Secondary | ICD-10-CM | POA: Diagnosis not present

## 2019-09-04 DIAGNOSIS — I779 Disorder of arteries and arterioles, unspecified: Secondary | ICD-10-CM | POA: Diagnosis not present

## 2019-09-04 DIAGNOSIS — I2510 Atherosclerotic heart disease of native coronary artery without angina pectoris: Secondary | ICD-10-CM | POA: Diagnosis not present

## 2019-09-04 DIAGNOSIS — E7800 Pure hypercholesterolemia, unspecified: Secondary | ICD-10-CM | POA: Diagnosis not present

## 2019-09-04 DIAGNOSIS — I10 Essential (primary) hypertension: Secondary | ICD-10-CM | POA: Diagnosis not present

## 2019-11-07 ENCOUNTER — Emergency Department (HOSPITAL_COMMUNITY): Payer: Medicare Other

## 2019-11-07 ENCOUNTER — Inpatient Hospital Stay (HOSPITAL_COMMUNITY)
Admission: EM | Admit: 2019-11-07 | Discharge: 2019-11-11 | DRG: 178 | Disposition: A | Discharge status: Home Health | Payer: Medicare Other | Attending: Family Medicine | Admitting: Family Medicine

## 2019-11-07 ENCOUNTER — Other Ambulatory Visit: Payer: Self-pay

## 2019-11-07 DIAGNOSIS — L089 Local infection of the skin and subcutaneous tissue, unspecified: Secondary | ICD-10-CM

## 2019-11-07 DIAGNOSIS — Z452 Encounter for adjustment and management of vascular access device: Secondary | ICD-10-CM

## 2019-11-07 DIAGNOSIS — E785 Hyperlipidemia, unspecified: Secondary | ICD-10-CM | POA: Diagnosis present

## 2019-11-07 DIAGNOSIS — I2510 Atherosclerotic heart disease of native coronary artery without angina pectoris: Secondary | ICD-10-CM | POA: Diagnosis present

## 2019-11-07 DIAGNOSIS — I10 Essential (primary) hypertension: Secondary | ICD-10-CM | POA: Diagnosis present

## 2019-11-07 DIAGNOSIS — E1140 Type 2 diabetes mellitus with diabetic neuropathy, unspecified: Secondary | ICD-10-CM | POA: Diagnosis present

## 2019-11-07 DIAGNOSIS — Z86718 Personal history of other venous thrombosis and embolism: Secondary | ICD-10-CM

## 2019-11-07 DIAGNOSIS — Z951 Presence of aortocoronary bypass graft: Secondary | ICD-10-CM

## 2019-11-07 DIAGNOSIS — N179 Acute kidney failure, unspecified: Secondary | ICD-10-CM | POA: Diagnosis present

## 2019-11-07 DIAGNOSIS — E860 Dehydration: Secondary | ICD-10-CM | POA: Diagnosis present

## 2019-11-07 DIAGNOSIS — U071 COVID-19: Secondary | ICD-10-CM | POA: Diagnosis not present

## 2019-11-07 DIAGNOSIS — R197 Diarrhea, unspecified: Secondary | ICD-10-CM

## 2019-11-07 DIAGNOSIS — Z833 Family history of diabetes mellitus: Secondary | ICD-10-CM

## 2019-11-07 DIAGNOSIS — E1151 Type 2 diabetes mellitus with diabetic peripheral angiopathy without gangrene: Secondary | ICD-10-CM | POA: Diagnosis present

## 2019-11-07 DIAGNOSIS — Z91013 Allergy to seafood: Secondary | ICD-10-CM

## 2019-11-07 DIAGNOSIS — E1159 Type 2 diabetes mellitus with other circulatory complications: Secondary | ICD-10-CM | POA: Diagnosis present

## 2019-11-07 DIAGNOSIS — Z7982 Long term (current) use of aspirin: Secondary | ICD-10-CM

## 2019-11-07 DIAGNOSIS — N400 Enlarged prostate without lower urinary tract symptoms: Secondary | ICD-10-CM | POA: Diagnosis present

## 2019-11-07 DIAGNOSIS — Z7952 Long term (current) use of systemic steroids: Secondary | ICD-10-CM

## 2019-11-07 DIAGNOSIS — T8619 Other complication of kidney transplant: Secondary | ICD-10-CM | POA: Diagnosis present

## 2019-11-07 DIAGNOSIS — F17210 Nicotine dependence, cigarettes, uncomplicated: Secondary | ICD-10-CM | POA: Diagnosis present

## 2019-11-07 DIAGNOSIS — Z940 Kidney transplant status: Secondary | ICD-10-CM

## 2019-11-07 DIAGNOSIS — Z79899 Other long term (current) drug therapy: Secondary | ICD-10-CM

## 2019-11-07 DIAGNOSIS — Z8249 Family history of ischemic heart disease and other diseases of the circulatory system: Secondary | ICD-10-CM

## 2019-11-07 DIAGNOSIS — Y830 Surgical operation with transplant of whole organ as the cause of abnormal reaction of the patient, or of later complication, without mention of misadventure at the time of the procedure: Secondary | ICD-10-CM | POA: Diagnosis present

## 2019-11-07 LAB — CBC WITH DIFFERENTIAL/PLATELET
Abs Immature Granulocytes: 0.08 10*3/uL — ABNORMAL HIGH (ref 0.00–0.07)
Basophils Absolute: 0 10*3/uL (ref 0.0–0.1)
Basophils Relative: 0 %
Eosinophils Absolute: 0.1 10*3/uL (ref 0.0–0.5)
Eosinophils Relative: 2 %
HCT: 43.8 % (ref 39.0–52.0)
Hemoglobin: 13.6 g/dL (ref 13.0–17.0)
Immature Granulocytes: 2 %
Lymphocytes Relative: 18 %
Lymphs Abs: 0.7 10*3/uL (ref 0.7–4.0)
MCH: 24.5 pg — ABNORMAL LOW (ref 26.0–34.0)
MCHC: 31.1 g/dL (ref 30.0–36.0)
MCV: 78.9 fL — ABNORMAL LOW (ref 80.0–100.0)
Monocytes Absolute: 0.5 10*3/uL (ref 0.1–1.0)
Monocytes Relative: 13 %
Neutro Abs: 2.4 10*3/uL (ref 1.7–7.7)
Neutrophils Relative %: 65 %
Platelets: 101 10*3/uL — ABNORMAL LOW (ref 150–400)
RBC: 5.55 MIL/uL (ref 4.22–5.81)
RDW: 18.6 % — ABNORMAL HIGH (ref 11.5–15.5)
WBC: 3.7 10*3/uL — ABNORMAL LOW (ref 4.0–10.5)
nRBC: 0 % (ref 0.0–0.2)

## 2019-11-07 LAB — COMPREHENSIVE METABOLIC PANEL
ALT: 16 U/L (ref 0–44)
AST: 24 U/L (ref 15–41)
Albumin: 3.1 g/dL — ABNORMAL LOW (ref 3.5–5.0)
Alkaline Phosphatase: 42 U/L (ref 38–126)
Anion gap: 12 (ref 5–15)
BUN: 41 mg/dL — ABNORMAL HIGH (ref 6–20)
CO2: 21 mmol/L — ABNORMAL LOW (ref 22–32)
Calcium: 9.2 mg/dL (ref 8.9–10.3)
Chloride: 106 mmol/L (ref 98–111)
Creatinine, Ser: 3.6 mg/dL — ABNORMAL HIGH (ref 0.61–1.24)
GFR calc Af Amer: 20 mL/min — ABNORMAL LOW (ref >60)
GFR calc non Af Amer: 17 mL/min — ABNORMAL LOW (ref >60)
Glucose, Bld: 116 mg/dL — ABNORMAL HIGH (ref 70–99)
Potassium: 4.3 mmol/L (ref 3.5–5.1)
Sodium: 139 mmol/L (ref 135–145)
Total Bilirubin: 0.7 mg/dL (ref 0.3–1.2)
Total Protein: 7.5 g/dL (ref 6.5–8.1)

## 2019-11-07 LAB — LACTIC ACID, PLASMA: Lactic Acid, Venous: 1.7 mmol/L (ref 0.5–1.9)

## 2019-11-07 LAB — LIPASE, BLOOD: Lipase: 44 U/L (ref 11–51)

## 2019-11-07 MED ORDER — SODIUM CHLORIDE 0.9 % IV BOLUS: 1000.0000 mL | Freq: Once | INTRAVENOUS | Status: DC

## 2019-11-07 MED ORDER — SODIUM CHLORIDE 0.9 % IV BOLUS
1000.0000 mL | Freq: Once | INTRAVENOUS | Status: AC
  Administered 2019-11-07: 22:00:00 1000 mL via INTRAVENOUS

## 2019-11-07 MED ORDER — ACETAMINOPHEN 325 MG PO TABS
650.0000 mg | ORAL_TABLET | Freq: Once | ORAL | Status: AC
  Administered 2019-11-07: 22:00:00 650 mg via ORAL
  Filled 2019-11-07: qty 2

## 2019-11-07 NOTE — ED Notes (Signed)
MD at bedside. 

## 2019-11-07 NOTE — ED Triage Notes (Signed)
Pt reports diarrhea x2 days. Reports having 9-10 episodes per day. New onset of weakness today.

## 2019-11-07 NOTE — ED Provider Notes (Signed)
[facility] EMERGENCY DEPARTMENT Provider Note   CSN: [id number] Arrival date & time: 11/07/19  2057     History Chief Complaint  Patient presents with  . Diarrhea    Theodore Arellano is a 59 y.o. male history of CAD, hyperlipidemia, hypertension, kidney transplant on Prograf, left leg wound with a wound VAC here presenting with fever, diarrhea, abdominal pain .  Per care everywhere notes, patient was recently diagnosed with Covid.  However he does not remember when he was diagnosed but states that about a month ago and there is no record here or in care everywhere of the exact date of diagnosis .  Patient states that he has been having chills and fever for the last several days .  He also has been having diarrhea today. He states that he has had abdominal cramps as well.  Patient states that he is not on antibiotics right now .  He states that he does have a wound nurse that comes out and check on his wound VAC and the wound has been getting better.  The history is provided by the patient.       Past Medical History:  Diagnosis Date  . Anxiety   . Arthritis    "lower back" (11/28/2017)  . CAD (coronary artery disease)   . Chronic lower back pain   . H/O immunosuppressive therapy    chronic/notes 11/28/2017  . History of gout    "before kidney transport" (11/28/2017)  . Hyperlipidemia   . Hypertension   . Kidney disease    s/p kidney transplant 2011; "not on dialysis now" (11/28/2017)    Patient Active Problem List   Diagnosis Date Noted  . Unstable angina pectoris due to coronary arteriosclerosis ([place]) 05/30/2019  . Coronary artery disease involving native coronary artery of native heart with unstable angina pectoris ([place])   . Coronary artery disease due to lipid rich plaque   . Elevated troponin   . Chest pain 08/27/2016  . Renal transplant recipient 08/27/2016  . Essential hypertension 08/27/2016  . HLD (hyperlipidemia) 08/27/2016  . Type 2 diabetes  mellitus with vascular disease ([place]) 08/27/2016  . Left groin pain 06/24/2013    Past Surgical History:  Procedure Laterality Date  . ARTERIOVENOUS GRAFT PLACEMENT Right 04/2007   Archie Endo 02/01/2011  . AV FISTULA PLACEMENT Left 12/20/2005; 12/2006   Archie Endo 5/15/2012Marland Kitchen Archie Endo 02/13/2011  . CARDIAC CATHETERIZATION    . HD access procedures    . INGUINAL HERNIA REPAIR Left   . KIDNEY TRANSPLANT  2011  . LEFT HEART CATH AND CORONARY ANGIOGRAPHY N/A 11/29/2017   Procedure: LEFT HEART CATH AND CORONARY ANGIOGRAPHY;  Surgeon: Leonie Man, MD;  Location: [place] CV LAB;  Service: Cardiovascular;  Laterality: N/A;  . LEFT HEART CATH AND CORONARY ANGIOGRAPHY N/A 06/01/2019   Procedure: LEFT HEART CATH AND CORONARY ANGIOGRAPHY;  Surgeon: Burnell Blanks, MD;  Location: [place] [facility];  Service: Cardiovascular;  Laterality: N/A;  . LEFT HEART CATHETERIZATION WITH CORONARY ANGIOGRAM  03/11/2012   Procedure: LEFT HEART CATHETERIZATION WITH CORONARY ANGIOGRAM;  Surgeon: Jolaine Artist, MD;  Location[facility];  Service: Cardiovascular;;  . RIGHT HEART CATHETERIZATION N/A 03/11/2012   Procedure: RIGHT HEART CATH;  Surgeon: Jolaine Artist, MD;  Location: [facility] CATH LAB;  Service: Cardiovascular;  Laterality: N/A;  . THROMBECTOMY Right 12/2007   Archie Endo 02/01/2011  . THROMBECTOMY / ARTERIOVENOUS GRAFT REVISION Left 12/2006   Archie Endo 02/13/2011  . THROMBECTOMY / ARTERIOVENOUS  GRAFT REVISION Right 07/2007; 10/2007;01/2008;   Archie Endo 01/31/2011; Archie Endo 02/01/2011; Archie Endo 01/31/2011  . THROMBECTOMY AND REVISION OF ARTERIOVENTOUS (AV) GORETEX  GRAFT  03/2007 X 2   /notes 02/01/2011       Family History  Problem Relation Age of Onset  . Hypertension Father   . Diabetes Mother     Social History   Tobacco Use  . Smoking status: Current Every Day Smoker    Packs/day: 0.50    Years: 40.00    Pack years: 20.00    Types: Cigarettes  . Smokeless tobacco: Never Used  Substance Use Topics  .  Alcohol use: Yes    Alcohol/week: 12.0 standard drinks    Types: 12 Cans of beer per week  . Drug use: No    Home Medications Prior to Admission medications   Medication Sig Start Date End Date Taking? Authorizing Provider  amLODipine (NORVASC) 10 MG tablet Take 10 mg by mouth daily.     [provider]  aspirin EC 81 MG tablet Take 81 mg by mouth daily.    [provider]  carvedilol (COREG) 25 MG tablet Take 25 mg by mouth 2 (two) times daily.  10/14/16   [provider]  colchicine 0.6 MG tablet Take 0.6 mg by mouth daily as needed (for gout).     [provider]  isosorbide mononitrate (IMDUR) 60 MG 24 hr tablet Take 1 tablet (60 mg total) by mouth daily. 06/01/19   Cheryln Manly, NP  lisinopril (PRINIVIL,ZESTRIL) 10 MG tablet Take 1 tablet (10 mg total) by mouth daily. 08/28/16   Geradine Girt, DO  magnesium oxide (MAG-OX) 400 MG tablet Take 400 mg by mouth 2 (two) times daily.     [provider]  mycophenolate (MYFORTIC) 360 MG TBEC Take 720 mg by mouth 2 (two) times daily.    [provider]  NITROSTAT 0.4 MG SL tablet Place 0.4 mg under the tongue every 5 (five) minutes as needed for chest pain.  07/27/14   [provider]  omeprazole (PRILOSEC) 20 MG capsule Take 20 mg by mouth daily.    [provider]  predniSONE (DELTASONE) 5 MG tablet Take 5 mg by mouth every morning.     [provider]  ranolazine (RANEXA) 500 MG 12 hr tablet Take 500 mg by mouth 2 (two) times daily.    [provider]  rosuvastatin (CRESTOR) 40 MG tablet Take 1 tablet (40 mg total) by mouth daily at 6 PM. 12/02/17   Elwyn Reach, MD  sodium bicarbonate 650 MG tablet Take 1,300 mg by mouth 2 (two) times daily.    [provider]  tacrolimus (PROGRAF) 1 MG capsule Take 2-3 mg by mouth 2 (two) times daily. Take 3 mg every AM and 2 mg every PM    [provider]  traMADol (ULTRAM) 50 MG tablet Take  50-100 mg by mouth every 4 (four) hours as needed for pain. 05/29/16   [provider]    Allergies    Percocet [oxycodone-acetaminophen], Vicodin [hydrocodone-acetaminophen], Shellfish allergy, and Tomato  Review of Systems   Review of Systems  Constitutional: Positive for chills and fever.  Gastrointestinal: Positive for diarrhea.  All other systems reviewed and are negative.   Physical Exam Updated Vital Signs BP (!) 66/54   Pulse 95   Temp (!) 101.8 F (38.8 C) (Rectal)   Resp (!) 27   Ht 5\' 8"  (1.727 m)   SpO2 100%  BMI 28.66 kg/m   Physical Exam Vitals and nursing note reviewed.  Constitutional:      Comments: Chronically ill   HENT:     Head: Normocephalic.     Nose: Nose normal.     Mouth/Throat:     Mouth: Mucous membranes are dry.  Eyes:     Extraocular Movements: Extraocular movements intact.     Pupils: Pupils are equal, round, and reactive to light.  Cardiovascular:     Rate and Rhythm: Normal rate and regular rhythm.     Pulses: Normal pulses.     Heart sounds: Normal heart sounds.  Pulmonary:     Effort: Pulmonary effort is normal.     Breath sounds: Normal breath sounds.  Abdominal:     General: Abdomen is flat.     Palpations: Abdomen is soft.  Musculoskeletal:        General: Normal range of motion.     Cervical back: Normal range of motion.  Skin:    General: Skin is warm.     Capillary Refill: Capillary refill takes less than 2 seconds.     Comments: L shin wound vac in place, 2+ pedal pulses   Neurological:     General: No focal deficit present.     Mental Status: He is alert.  Psychiatric:        Mood and Affect: Mood normal.        Behavior: Behavior normal.     ED Results / Procedures / Treatments   Labs (all labs ordered are listed, but only abnormal results are displayed) Labs Reviewed  CBC WITH DIFFERENTIAL/PLATELET - Abnormal; Notable for the following components:      Result Value   WBC 3.7 (*)    MCV 78.9 (*)     MCH 24.5 (*)    RDW 18.6 (*)    Platelets 101 (*)    Abs Immature Granulocytes 0.08 (*)    All other components within normal limits  COMPREHENSIVE METABOLIC PANEL - Abnormal; Notable for the following components:   CO2 21 (*)    Glucose, Bld 116 (*)    BUN 41 (*)    Creatinine, Ser 3.60 (*)    Albumin 3.1 (*)    GFR calc non Af Amer 17 (*)    GFR calc Af Amer 20 (*)    All other components within normal limits  CULTURE, BLOOD (ROUTINE X 2)  CULTURE, BLOOD (ROUTINE X 2)  RESPIRATORY PANEL BY RT PCR (FLU A&B, COVID)  GI PATHOGEN PANEL BY PCR, STOOL  C DIFFICILE QUICK SCREEN W PCR REFLEX  LACTIC ACID, PLASMA  LIPASE, BLOOD  LACTIC ACID, PLASMA  I-STAT CHEM 8, ED    EKG EKG Interpretation  Date/Time:  Saturday November 07 2019 21:09:39 EST Ventricular Rate:  89 PR Interval:    QRS Duration: 99 QT Interval:  380 QTC Calculation: 463 R Axis:   41 Text Interpretation: Atrial fibrillation Probable LVH with secondary repol abnrm Abnormal inferior Q waves Anterior Q waves, possibly due to LVH Artifact in lead(s) V1 No significant change since last tracing Confirmed by Wandra Arthurs ([id number]) on 11/07/2019 9:49:57 PM   Radiology DG Chest Port 1 View  Result Date: 11/07/2019 CLINICAL DATA:  Fever EXAM: PORTABLE CHEST 1 VIEW COMPARISON:  05/30/2019 FINDINGS: Heart is normal size. Prior median sternotomy. No confluent opacities or effusions. No acute bony abnormality. IMPRESSION: No active disease. Electronically Signed   By: Rolm Baptise M.D.   On: 11/07/2019  22:15   DG Tibia/Fibula Left Port  Result Date: 11/07/2019 CLINICAL DATA:  Fever.  Wound infection EXAM: PORTABLE LEFT TIBIA AND FIBULA - 2 VIEW COMPARISON:  None. FINDINGS: Soft tissue defect/wound within the lateral soft tissues. Vascular calcifications. No acute bony abnormality. No bone destruction to suggest osteomyelitis. IMPRESSION: Lateral soft tissue wound.  No bony abnormality. Electronically Signed   By: Rolm Baptise  M.D.   On: 11/07/2019 22:16    Procedures Procedures (including critical care time)  CRITICAL CARE Performed by: Wandra Arthurs   Total critical care time: 30 minutes  Critical care time was exclusive of separately billable procedures and treating other patients.  Critical care was necessary to treat or prevent imminent or life-threatening deterioration.  Critical care was time spent personally by me on the following activities: development of treatment plan with patient and/or surrogate as well as nursing, discussions with consultants, evaluation of patient's response to treatment, examination of patient, obtaining history from patient or surrogate, ordering and performing treatments and interventions, ordering and review of laboratory studies, ordering and review of radiographic studies, pulse oximetry and re-evaluation of patient's condition.  Angiocath insertion Performed by: Wandra Arthurs  Consent: Verbal consent obtained. Risks and benefits: risks, benefits and alternatives were discussed Time out: Immediately prior to procedure a "time out" was called to verify the correct patient, procedure, equipment, support staff and site/side marked as required.  Preparation: Patient was prepped and draped in the usual sterile fashion.  Vein Location: L brachial  Ultrasound Guided  Gauge: 20 long   Normal blood return and flush without difficulty Patient tolerance: Patient tolerated the procedure well with no immediate complications.     Medications Ordered in ED Medications  acetaminophen (TYLENOL) tablet 650 mg (650 mg Oral Given 11/07/19 2152)  sodium chloride 0.9 % bolus 1,000 mL (1,000 mLs Intravenous New Bag/Given 11/07/19 2200)    ED Course  I have reviewed the triage vital signs and the nursing notes.  Pertinent labs & imaging results that were available during my care of the patient were reviewed by me and considered in my medical decision making (see chart for  details).    MDM Rules/Calculators/A&P                      Theodore Arellano is a 59 y.o. male here presenting with fever, abdominal pain, diarrhea.  He was recently diagnosed with Covid unclear when the exact time of diagnosis was.  He has been running fevers and has been having worsening abdominal pain and diarrhea.  Consider C. difficile versus colitis versus abdominal pain associated with Covid.  Since I am unable to find any results of Covid in our system or care everywhere, will retest for Covid.  We will also get labs and lactate and cultures and C. difficile and GI pathogen.  We will also get CT abdomen pelvis as well.  11:25 PM CT pending. Has acute renal failure with Cr 3.6. xray tib/fib unremarkable. WBC is normal. Lactate normal. Fever can be from COVID vs C diff. CT ab/pel pending and GI pathogen and C Diff pending. Anticipate admission. Signed out to Dr. Leonides Schanz in the ED.   Final Clinical Impression(s) / ED Diagnoses Final diagnoses:  Wound infection    Rx / DC Orders ED Discharge Orders    None       Drenda Freeze, MD 11/07/19 2326

## 2019-11-07 NOTE — ED Provider Notes (Signed)
11:30 AM  Assumed care from Dr. Darl Householder.  Patient is a 59 y.o. M with history of renal transplant who presents to ED with fever, diarrhea, abdominal pain.  Likely COVID +.  He states was positive but can't tell us when and there is no firm documentation.  Febrile 101 here.  Diffuse abdominal pain.  Has ARF here - now 3.6 and was 1.2 in August 2020.  Getting IVF.  Thought prerenal in nature secondary to diarrhea.  Has wound vac to LLE due to chronic wound.  No signs of cellultis.  CTAP pending.  Will need admission.   Patient is Covid positive.  CT shows signs of possible cystitis.  Will obtain urinalysis, urine culture and discuss with medicine for admission.    1:39 AM Discussed patient's case with hospitalist, Dr. Alcario Drought.  I have recommended admission and patient (and family if present) agree with this plan. Admitting physician will place admission orders.   I reviewed all nursing notes, vitals, pertinent previous records and interpreted all EKGs, lab and urine results, imaging (as available).     Havana Baldwin, Delice Bison, DO 11/08/19 0139

## 2019-11-07 NOTE — ED Triage Notes (Signed)
Pt h/o kidney transplant 2009 (pt unsure), wound vac to L leg from vascular bypass surgery that became infected.   Pt reports that he takes all home meds as described, unable to name any specific medications.

## 2019-11-08 ENCOUNTER — Inpatient Hospital Stay: Payer: Self-pay

## 2019-11-08 ENCOUNTER — Inpatient Hospital Stay (HOSPITAL_COMMUNITY): Payer: Medicare Other

## 2019-11-08 ENCOUNTER — Emergency Department (HOSPITAL_COMMUNITY): Payer: Medicare Other

## 2019-11-08 DIAGNOSIS — I2510 Atherosclerotic heart disease of native coronary artery without angina pectoris: Secondary | ICD-10-CM | POA: Diagnosis present

## 2019-11-08 DIAGNOSIS — Z86718 Personal history of other venous thrombosis and embolism: Secondary | ICD-10-CM

## 2019-11-08 DIAGNOSIS — Z8249 Family history of ischemic heart disease and other diseases of the circulatory system: Secondary | ICD-10-CM | POA: Diagnosis not present

## 2019-11-08 DIAGNOSIS — Z91013 Allergy to seafood: Secondary | ICD-10-CM | POA: Diagnosis not present

## 2019-11-08 DIAGNOSIS — R197 Diarrhea, unspecified: Secondary | ICD-10-CM | POA: Diagnosis not present

## 2019-11-08 DIAGNOSIS — Z833 Family history of diabetes mellitus: Secondary | ICD-10-CM | POA: Diagnosis not present

## 2019-11-08 DIAGNOSIS — Z7952 Long term (current) use of systemic steroids: Secondary | ICD-10-CM | POA: Diagnosis not present

## 2019-11-08 DIAGNOSIS — U071 COVID-19: Secondary | ICD-10-CM | POA: Diagnosis present

## 2019-11-08 DIAGNOSIS — N179 Acute kidney failure, unspecified: Secondary | ICD-10-CM | POA: Diagnosis present

## 2019-11-08 DIAGNOSIS — Z79899 Other long term (current) drug therapy: Secondary | ICD-10-CM | POA: Diagnosis not present

## 2019-11-08 DIAGNOSIS — E1140 Type 2 diabetes mellitus with diabetic neuropathy, unspecified: Secondary | ICD-10-CM | POA: Diagnosis present

## 2019-11-08 DIAGNOSIS — E785 Hyperlipidemia, unspecified: Secondary | ICD-10-CM | POA: Diagnosis present

## 2019-11-08 DIAGNOSIS — Z940 Kidney transplant status: Secondary | ICD-10-CM

## 2019-11-08 DIAGNOSIS — T8619 Other complication of kidney transplant: Secondary | ICD-10-CM | POA: Diagnosis present

## 2019-11-08 DIAGNOSIS — Z951 Presence of aortocoronary bypass graft: Secondary | ICD-10-CM | POA: Diagnosis not present

## 2019-11-08 DIAGNOSIS — I10 Essential (primary) hypertension: Secondary | ICD-10-CM | POA: Diagnosis present

## 2019-11-08 DIAGNOSIS — E1151 Type 2 diabetes mellitus with diabetic peripheral angiopathy without gangrene: Secondary | ICD-10-CM | POA: Diagnosis present

## 2019-11-08 DIAGNOSIS — Z7982 Long term (current) use of aspirin: Secondary | ICD-10-CM | POA: Diagnosis not present

## 2019-11-08 DIAGNOSIS — E1159 Type 2 diabetes mellitus with other circulatory complications: Secondary | ICD-10-CM

## 2019-11-08 DIAGNOSIS — N400 Enlarged prostate without lower urinary tract symptoms: Secondary | ICD-10-CM | POA: Diagnosis present

## 2019-11-08 DIAGNOSIS — E860 Dehydration: Secondary | ICD-10-CM | POA: Diagnosis present

## 2019-11-08 DIAGNOSIS — F17210 Nicotine dependence, cigarettes, uncomplicated: Secondary | ICD-10-CM | POA: Diagnosis present

## 2019-11-08 DIAGNOSIS — Y830 Surgical operation with transplant of whole organ as the cause of abnormal reaction of the patient, or of later complication, without mention of misadventure at the time of the procedure: Secondary | ICD-10-CM | POA: Diagnosis present

## 2019-11-08 LAB — CBC WITH DIFFERENTIAL/PLATELET
Abs Immature Granulocytes: 0.05 10*3/uL (ref 0.00–0.07)
Basophils Absolute: 0 10*3/uL (ref 0.0–0.1)
Basophils Relative: 0 %
Eosinophils Absolute: 0 10*3/uL (ref 0.0–0.5)
Eosinophils Relative: 1 %
HCT: 41.3 % (ref 39.0–52.0)
Hemoglobin: 13.4 g/dL (ref 13.0–17.0)
Immature Granulocytes: 2 %
Lymphocytes Relative: 13 %
Lymphs Abs: 0.4 10*3/uL — ABNORMAL LOW (ref 0.7–4.0)
MCH: 24.7 pg — ABNORMAL LOW (ref 26.0–34.0)
MCHC: 32.4 g/dL (ref 30.0–36.0)
MCV: 76.2 fL — ABNORMAL LOW (ref 80.0–100.0)
Monocytes Absolute: 0.2 10*3/uL (ref 0.1–1.0)
Monocytes Relative: 6 %
Neutro Abs: 2.5 10*3/uL (ref 1.7–7.7)
Neutrophils Relative %: 78 %
Platelets: 88 10*3/uL — ABNORMAL LOW (ref 150–400)
RBC: 5.42 MIL/uL (ref 4.22–5.81)
RDW: 18.7 % — ABNORMAL HIGH (ref 11.5–15.5)
WBC: 3.3 10*3/uL — ABNORMAL LOW (ref 4.0–10.5)
nRBC: 0 % (ref 0.0–0.2)

## 2019-11-08 LAB — BASIC METABOLIC PANEL
Anion gap: 13 (ref 5–15)
BUN: 41 mg/dL — ABNORMAL HIGH (ref 6–20)
CO2: 16 mmol/L — ABNORMAL LOW (ref 22–32)
Calcium: 8.8 mg/dL — ABNORMAL LOW (ref 8.9–10.3)
Chloride: 108 mmol/L (ref 98–111)
Creatinine, Ser: 2.46 mg/dL — ABNORMAL HIGH (ref 0.61–1.24)
GFR calc Af Amer: 32 mL/min — ABNORMAL LOW (ref >60)
GFR calc non Af Amer: 28 mL/min — ABNORMAL LOW (ref >60)
Glucose, Bld: 87 mg/dL (ref 70–99)
Potassium: 4.1 mmol/L (ref 3.5–5.1)
Sodium: 137 mmol/L (ref 135–145)

## 2019-11-08 LAB — D-DIMER, QUANTITATIVE: D-Dimer, Quant: 3.79 ug/mL-FEU — ABNORMAL HIGH (ref 0.00–0.50)

## 2019-11-08 LAB — GLUCOSE, CAPILLARY
Glucose-Capillary: 88 mg/dL (ref 70–99)
Glucose-Capillary: 96 mg/dL (ref 70–99)
Glucose-Capillary: 127 mg/dL — ABNORMAL HIGH (ref 70–99)

## 2019-11-08 LAB — CBG MONITORING, ED
Glucose-Capillary: 93 mg/dL (ref 70–99)
Glucose-Capillary: 130 mg/dL — ABNORMAL HIGH (ref 70–99)

## 2019-11-08 LAB — RESPIRATORY PANEL BY RT PCR (FLU A&B, COVID)
Influenza A by PCR: NEGATIVE
Influenza B by PCR: NEGATIVE
SARS Coronavirus 2 by RT PCR: POSITIVE — AB

## 2019-11-08 LAB — PROCALCITONIN: Procalcitonin: 0.28 ng/mL

## 2019-11-08 LAB — APTT: aPTT: 179 seconds (ref 24–36)

## 2019-11-08 LAB — ABO/RH: ABO/RH(D): O POS

## 2019-11-08 LAB — C-REACTIVE PROTEIN: CRP: 15.7 mg/dL — ABNORMAL HIGH (ref <1.0)

## 2019-11-08 LAB — HEPARIN LEVEL (UNFRACTIONATED): Heparin Unfractionated: 0.69 IU/mL (ref 0.30–0.70)

## 2019-11-08 MED ORDER — SODIUM CHLORIDE 0.9 % IV SOLN: 200.0000 mg | Freq: Once | INTRAVENOUS | Status: DC

## 2019-11-08 MED ORDER — ACETAMINOPHEN 325 MG PO TABS
650.0000 mg | ORAL_TABLET | Freq: Once | ORAL | Status: AC
  Administered 2019-11-08: 11:00:00 650 mg via ORAL
  Filled 2019-11-08: qty 2

## 2019-11-08 MED ORDER — ATORVASTATIN CALCIUM 40 MG PO TABS
80.0000 mg | ORAL_TABLET | Freq: Every day | ORAL | Status: DC
  Administered 2019-11-08 – 2019-11-10 (×3): 80 mg via ORAL
  Filled 2019-11-08 (×3): qty 2

## 2019-11-08 MED ORDER — ACETAMINOPHEN 325 MG PO TABS
650.0000 mg | ORAL_TABLET | Freq: Four times a day (QID) | ORAL | Status: DC | PRN
  Administered 2019-11-08 – 2019-11-11 (×3): 650 mg via ORAL
  Filled 2019-11-08 (×3): qty 2

## 2019-11-08 MED ORDER — HEPARIN BOLUS VIA INFUSION
4000.0000 [IU] | Freq: Once | INTRAVENOUS | Status: AC
  Administered 2019-11-08: 4000 [IU] via INTRAVENOUS
  Filled 2019-11-08: qty 4000

## 2019-11-08 MED ORDER — PREDNISONE 10 MG PO TABS
15.0000 mg | ORAL_TABLET | Freq: Every day | ORAL | Status: DC
  Administered 2019-11-08: 15 mg via ORAL
  Filled 2019-11-08: qty 2
  Filled 2019-11-08: qty 1

## 2019-11-08 MED ORDER — SODIUM CHLORIDE 0.9 % IV SOLN
200.0000 mg | Freq: Once | INTRAVENOUS | Status: AC
  Administered 2019-11-08: 200 mg via INTRAVENOUS
  Filled 2019-11-08: qty 200

## 2019-11-08 MED ORDER — TACROLIMUS 1 MG PO CAPS
3.0000 mg | ORAL_CAPSULE | Freq: Every day | ORAL | Status: DC
  Administered 2019-11-08 – 2019-11-11 (×4): 3 mg via ORAL
  Filled 2019-11-08 (×4): qty 3

## 2019-11-08 MED ORDER — SODIUM CHLORIDE 0.9 % IV SOLN: 100.0000 mg | Freq: Every day | INTRAVENOUS | Status: DC

## 2019-11-08 MED ORDER — GUAIFENESIN-DM 100-10 MG/5ML PO SYRP
10.0000 mL | ORAL_SOLUTION | ORAL | Status: DC | PRN
  Filled 2019-11-08: qty 10

## 2019-11-08 MED ORDER — PREDNISONE 5 MG PO TABS: 15.0000 mg | ORAL_TABLET | Freq: Every day | ORAL | Status: DC

## 2019-11-08 MED ORDER — ONDANSETRON HCL 4 MG PO TABS: 4.0000 mg | ORAL_TABLET | Freq: Four times a day (QID) | ORAL | Status: DC | PRN

## 2019-11-08 MED ORDER — HEPARIN (PORCINE) 25000 UT/250ML-% IV SOLN
1250.0000 [IU]/h | INTRAVENOUS | Status: DC
  Administered 2019-11-08 – 2019-11-09 (×2): 1250 [IU]/h via INTRAVENOUS
  Filled 2019-11-08 (×2): qty 250

## 2019-11-08 MED ORDER — TACROLIMUS 1 MG PO CAPS
2.0000 mg | ORAL_CAPSULE | Freq: Every evening | ORAL | Status: DC
  Administered 2019-11-08 – 2019-11-10 (×3): 2 mg via ORAL
  Filled 2019-11-08 (×4): qty 2

## 2019-11-08 MED ORDER — PREDNISONE 20 MG PO TABS
40.0000 mg | ORAL_TABLET | Freq: Every day | ORAL | Status: DC
  Administered 2019-11-09 – 2019-11-11 (×3): 40 mg via ORAL
  Filled 2019-11-08 (×3): qty 2

## 2019-11-08 MED ORDER — SODIUM CHLORIDE 0.9 % IV SOLN: INTRAVENOUS | Status: DC

## 2019-11-08 MED ORDER — HEPARIN (PORCINE) 25000 UT/250ML-% IV SOLN
1150.0000 [IU]/h | INTRAVENOUS | Status: DC
  Administered 2019-11-08: 09:00:00 1150 [IU]/h via INTRAVENOUS
  Filled 2019-11-08: qty 250

## 2019-11-08 MED ORDER — ONDANSETRON HCL 4 MG/2ML IJ SOLN: 4.0000 mg | Freq: Four times a day (QID) | INTRAMUSCULAR | Status: DC | PRN

## 2019-11-08 MED ORDER — PREDNISONE 20 MG PO TABS
25.0000 mg | ORAL_TABLET | Freq: Once | ORAL | Status: AC
  Administered 2019-11-08: 25 mg via ORAL
  Filled 2019-11-08: qty 1

## 2019-11-08 MED ORDER — INSULIN ASPART 100 UNIT/ML ~~LOC~~ SOLN
0.0000 [IU] | SUBCUTANEOUS | Status: DC
  Administered 2019-11-08 (×2): 1 [IU] via SUBCUTANEOUS
  Administered 2019-11-09: 3 [IU] via SUBCUTANEOUS
  Administered 2019-11-09: 2 [IU] via SUBCUTANEOUS
  Administered 2019-11-10: 3 [IU] via SUBCUTANEOUS
  Administered 2019-11-10 (×3): 1 [IU] via SUBCUTANEOUS
  Administered 2019-11-11: 5 [IU] via SUBCUTANEOUS
  Administered 2019-11-11: 12:00:00 2 [IU] via SUBCUTANEOUS
  Administered 2019-11-11: 1 [IU] via SUBCUTANEOUS

## 2019-11-08 MED ORDER — RANOLAZINE ER 500 MG PO TB12
500.0000 mg | ORAL_TABLET | Freq: Two times a day (BID) | ORAL | Status: DC
  Filled 2019-11-08: qty 1

## 2019-11-08 MED ORDER — SODIUM CHLORIDE 0.9 % IV SOLN
100.0000 mg | Freq: Every day | INTRAVENOUS | Status: DC
  Administered 2019-11-09 – 2019-11-11 (×3): 100 mg via INTRAVENOUS
  Filled 2019-11-08 (×3): qty 20

## 2019-11-08 MED ORDER — SODIUM BICARBONATE 650 MG PO TABS
1300.0000 mg | ORAL_TABLET | Freq: Two times a day (BID) | ORAL | Status: DC
  Administered 2019-11-08 – 2019-11-11 (×7): 1300 mg via ORAL
  Filled 2019-11-08 (×8): qty 2

## 2019-11-08 MED ORDER — CHLORHEXIDINE GLUCONATE CLOTH 2 % EX PADS
6.0000 | MEDICATED_PAD | Freq: Every day | CUTANEOUS | Status: DC
  Administered 2019-11-08 – 2019-11-10 (×3): 6 via TOPICAL

## 2019-11-08 NOTE — ED Notes (Signed)
Pt transported to CT ?

## 2019-11-08 NOTE — Progress Notes (Signed)
ANTICOAGULATION CONSULT NOTE - [place] for Heparin Indication: h/o DVT (last month) - while Xarelto on hold  Allergies  Allergen Reactions  . Percocet [Oxycodone-Acetaminophen] Hives and Itching  . Vicodin [Hydrocodone-Acetaminophen] Hives and Itching  . Shellfish Allergy Hives  . Tomato Hives    Patient Measurements: Height: 5\' 8"  (172.7 cm) Weight: 182 lb 1.6 oz (82.6 kg) IBW/kg (Calculated) : 68.4  Wt: 83 kg  Vital Signs: Temp: 99.5 F (37.5 C) (02/07 1600) Temp Source: Oral (02/07 1600) BP: 120/86 (02/07 1600) Pulse Rate: 76 (02/07 1600)  Labs: Recent Labs    11/07/19 2119 11/08/19 1030  HGB 13.6 13.4  HCT 43.8 41.3  PLT 101* 88*  APTT  --  179*  HEPARINUNFRC  --  0.69  CREATININE 3.60* 2.46*    Estimated Creatinine Clearance: 33.9 mL/min (A) (by C-G formula based on SCr of 2.46 mg/dL (H)).   Medical History: Past Medical History:  Diagnosis Date  . Anxiety   . Arthritis    "lower back" (11/28/2017)  . CAD (coronary artery disease)   . Chronic lower back pain   . H/O immunosuppressive therapy    chronic/notes 11/28/2017  . History of gout    "before kidney transport" (11/28/2017)  . Hyperlipidemia   . Hypertension   . Kidney disease    s/p kidney transplant 2011; "not on dialysis now" (11/28/2017)    Medications:    Assessment: 59 y.o. M presents with diarrhea. Also noted pt with recent diagnosis of COVID+. Pt on Xarelto PTA for recent DVT (1 mos ago) - last dose 2/4 pm. Pt now with AKI (SCr up to 3.6) so holding Xarelto and will use heparin for now.  The patient's heparin drip was started late due to lack of IV access. The levels this AM were drawn ~2 hours after the bolus and start of the drip - so likely falsely high with aPTT 179 and HL 0.69 - will not make any adjustments at this time and will recheck this afternoon.   Pt had IV line issue today, therefore, a central line was placed. Heparin can be restarted after 2  hrs. We will increase the rate when heparin is resumed to avoid bolus.  Goal of Therapy:  Heparin level 0.3-0.7 units/ml; 66-102 sec Monitor platelets by anticoagulation protocol: Yes   Plan:  Resume  Heparin at 1250 units/hr  Check 8 hr HL and PTT Daily HL/PTT  Onnie Boer, PharmD, [place], AAHIVP, CPP Infectious Disease Pharmacist 11/08/2019 5:29 PM

## 2019-11-08 NOTE — ED Notes (Signed)
Pt's wallet and what appears to be a charger for a laptop was left in pt's room. This RN gave items to security to transport to [employer].

## 2019-11-08 NOTE — ED Notes (Signed)
Date and time results received: 11/08/19   (use smartphrase ".now" to insert current time)  Test: Covid  Critical Value:Positive Name of Provider Notified: [place]

## 2019-11-08 NOTE — Progress Notes (Signed)
PHARMACY - PHYSICIAN COMMUNICATION CRITICAL VALUE ALERT - BLOOD CULTURE IDENTIFICATION (BCID)  Theodore Arellano is an 59 y.o. male who presented to [facility] on 11/07/2019 with a chief complaint of COVID  Assessment: Lab called with blood culture gram stain>1/4 bottles with GPC. This will likely be contaminant CNS.   Name of physician (or Provider) Contacted: Dr. Reesa Chew  Current antibiotics: None  Changes to prescribed antibiotics recommended:  None  Onnie Boer, PharmD, BCIDP, AAHIVP, CPP Infectious Disease Pharmacist 11/08/2019 1:54 PM

## 2019-11-08 NOTE — Progress Notes (Signed)
ANTICOAGULATION CONSULT NOTE - [place] for Heparin Indication: h/o DVT (last month) - while Xarelto on hold  Allergies  Allergen Reactions  . Percocet [Oxycodone-Acetaminophen] Hives and Itching  . Vicodin [Hydrocodone-Acetaminophen] Hives and Itching  . Shellfish Allergy Hives  . Tomato Hives    Patient Measurements: Height: 5\' 8"  (172.7 cm) IBW/kg (Calculated) : 68.4  Wt: 83 kg  Vital Signs: Temp: 102.6 F (39.2 C) (02/07 0845) Temp Source: Oral (02/07 0845) BP: 147/80 (02/07 0820) Pulse Rate: 89 (02/07 0820)  Labs: Recent Labs    11/07/19 2119  HGB 13.6  HCT 43.8  PLT 101*  CREATININE 3.60*    CrCl cannot be calculated (Unknown ideal weight.).   Medical History: Past Medical History:  Diagnosis Date  . Anxiety   . Arthritis    "lower back" (11/28/2017)  . CAD (coronary artery disease)   . Chronic lower back pain   . H/O immunosuppressive therapy    chronic/notes 11/28/2017  . History of gout    "before kidney transport" (11/28/2017)  . Hyperlipidemia   . Hypertension   . Kidney disease    s/p kidney transplant 2011; "not on dialysis now" (11/28/2017)    Medications:    Assessment: 59 y.o. M presents with diarrhea. Also noted pt with recent diagnosis of COVID+. Pt on Xarelto PTA for recent DVT (1 mos ago) - last dose 2/4 pm. Pt now with AKI (SCr up to 3.6) so holding Xarelto and will use heparin for now.  The patient's heparin drip was started late due to lack of IV access. The levels this AM were drawn ~2 hours after the bolus and start of the drip - so likely falsely high with aPTT 179 and HL 0.69 - will not make any adjustments at this time and will recheck this afternoon.   Goal of Therapy:  Heparin level 0.3-0.7 units/ml; 66-102 sec Monitor platelets by anticoagulation protocol: Yes   Plan:  - Continue Heparin at 1150 units/hr (11.5 ml/hr) - Will continue to monitor for any signs/symptoms of bleeding and will follow  up with heparin level and aPTT this evening.   Thank you for allowing pharmacy to be a part of this patient's care.  Alycia Rossetti, PharmD, BCPS Clinical Pharmacist 11/08/2019 12:52 PM   **Pharmacist phone directory can now be found on [email or web address] ([email or web address]).  Listed under [place].

## 2019-11-08 NOTE — ED Notes (Signed)
+  Tele, [place]  Breakfast ordered

## 2019-11-08 NOTE — Plan of Care (Signed)
  Problem: Education: Goal: Knowledge of risk factors and measures for prevention of condition will improve Outcome: Progressing   Problem: Respiratory: Goal: Will maintain a patent airway Outcome: Progressing Goal: Complications related to the disease process, condition or treatment will be avoided or minimized Outcome: Progressing   

## 2019-11-08 NOTE — H&P (Signed)
History and Physical    Theodore Arellano FYB:[id number] DOB: [birth year] DOA: 11/07/2019  PCP: [name], MD  Patient coming from: Home  I have personally briefly reviewed patient's old medical records in [place]  Chief Complaint: Diarrhea  HPI: Theodore Arellano is a 59 y.o. male with medical history significant of HTN, kidney transplant in 2011, CAD, PAD with ulcer and wound vac.  Patient presents to the ED with several day history of fever and chills.  Developed diarrhea and abd cramps starting 2 days ago.  Not currently on ABx.  Apparently was recently diagnosed with COVID per care-everywhere notes.   ED Course: COVID+, WBC 3.9k.  Creat 3.9 up from 0.9 in Dec.  BUN 41.  Given 1L IVF bolus.   Review of Systems: As per HPI, otherwise all review of systems negative.  Past Medical History:  Diagnosis Date  . Anxiety   . Arthritis    "lower back" (11/28/2017)  . CAD (coronary artery disease)   . Chronic lower back pain   . H/O immunosuppressive therapy    chronic/notes 11/28/2017  . History of gout    "before kidney transport" (11/28/2017)  . Hyperlipidemia   . Hypertension   . Kidney disease    s/p kidney transplant 2011; "not on dialysis now" (11/28/2017)    Past Surgical History:  Procedure Laterality Date  . ARTERIOVENOUS GRAFT PLACEMENT Right 04/2007   Archie Endo 02/01/2011  . AV FISTULA PLACEMENT Left 12/20/2005; 12/2006   Archie Endo 5/15/2012Marland Kitchen Archie Endo 02/13/2011  . CARDIAC CATHETERIZATION    . HD access procedures    . INGUINAL HERNIA REPAIR Left   . KIDNEY TRANSPLANT  2011  . LEFT HEART CATH AND CORONARY ANGIOGRAPHY N/A 11/29/2017   Procedure: LEFT HEART CATH AND CORONARY ANGIOGRAPHY;  Surgeon: Leonie Man, MD;  Location: [facility];  Service: Cardiovascular;  Laterality: N/A;  . LEFT HEART CATH AND CORONARY ANGIOGRAPHY N/A 06/01/2019   Procedure: LEFT HEART CATH AND CORONARY ANGIOGRAPHY;  Surgeon: Burnell Blanks, MD;  Location: [facility];  Service: Cardiovascular;  Laterality: N/A;  . LEFT HEART CATHETERIZATION WITH CORONARY ANGIOGRAM  03/11/2012   Procedure: LEFT HEART CATHETERIZATION WITH CORONARY ANGIOGRAM;  Surgeon: Jolaine Artist, MD;  Location: [facility] CATH LAB;  Service: Cardiovascular;;  . RIGHT HEART CATHETERIZATION N/A 03/11/2012   Procedure: RIGHT HEART CATH;  Surgeon: Jolaine Artist, MD;  Location: [facility] CATH LAB;  Service: Cardiovascular;  Laterality: N/A;  . THROMBECTOMY Right 12/2007   Archie Endo 02/01/2011  . THROMBECTOMY / ARTERIOVENOUS GRAFT REVISION Left 12/2006   Archie Endo 02/13/2011  . THROMBECTOMY / ARTERIOVENOUS GRAFT REVISION Right 07/2007; 10/2007;01/2008;   Archie Endo 01/31/2011; Archie Endo 02/01/2011; Archie Endo 01/31/2011  . THROMBECTOMY AND REVISION OF ARTERIOVENTOUS (AV) GORETEX  GRAFT  03/2007 X 2   /notes 02/01/2011     reports that he has been smoking cigarettes. He has a 20.00 pack-year smoking history. He has never used smokeless tobacco. He reports current alcohol use of about 12.0 standard drinks of alcohol per week. He reports that he does not use drugs.  Allergies  Allergen Reactions  . Percocet [Oxycodone-Acetaminophen] Hives and Itching  . Vicodin [Hydrocodone-Acetaminophen] Hives and Itching  . Shellfish Allergy Hives  . Tomato Hives    Family History  Problem Relation Age of Onset  . Hypertension Father   . Diabetes Mother      Prior to Admission medications   Medication Sig Start Date End Date Taking? Authorizing Provider  amLODipine (  NORVASC) 10 MG tablet Take 10 mg by mouth daily.     [provider]  aspirin EC 81 MG tablet Take 81 mg by mouth daily.    [provider]  carvedilol (COREG) 25 MG tablet Take 25 mg by mouth 2 (two) times daily.  10/14/16   [provider]  colchicine 0.6 MG tablet Take 0.6 mg by mouth daily as needed (for gout).     [provider]  isosorbide mononitrate (IMDUR) 60 MG 24 hr tablet Take 1 tablet (60 mg total) by mouth daily.  06/01/19   Cheryln Manly, NP  lisinopril (PRINIVIL,ZESTRIL) 10 MG tablet Take 1 tablet (10 mg total) by mouth daily. 08/28/16   Geradine Girt, DO  magnesium oxide (MAG-OX) 400 MG tablet Take 400 mg by mouth 2 (two) times daily.     [provider]  mycophenolate (MYFORTIC) 360 MG TBEC Take 720 mg by mouth 2 (two) times daily.    [provider]  NITROSTAT 0.4 MG SL tablet Place 0.4 mg under the tongue every 5 (five) minutes as needed for chest pain.  07/27/14   [provider]  omeprazole (PRILOSEC) 20 MG capsule Take 20 mg by mouth daily.    [provider]  predniSONE (DELTASONE) 5 MG tablet Take 5 mg by mouth every morning.     [provider]  ranolazine (RANEXA) 500 MG 12 hr tablet Take 500 mg by mouth 2 (two) times daily.    [provider]  rosuvastatin (CRESTOR) 40 MG tablet Take 1 tablet (40 mg total) by mouth daily at 6 PM. 12/02/17   Elwyn Reach, MD  sodium bicarbonate 650 MG tablet Take 1,300 mg by mouth 2 (two) times daily.    [provider]  tacrolimus (PROGRAF) 1 MG capsule Take 2-3 mg by mouth 2 (two) times daily. Take 3 mg every AM and 2 mg every PM    [provider]  traMADol (ULTRAM) 50 MG tablet Take 50-100 mg by mouth every 4 (four) hours as needed for pain. 05/29/16   [provider]    Physical Exam: Vitals:   11/08/19 0000 11/08/19 0015 11/08/19 0038 11/08/19 0100  BP: (!) 118/99 (!) 141/88  (!) 139/92  Pulse:   81 84  Resp: (!) 21 (!) 21 13 (!) 26  Temp: 98.7 F (37.1 C)     TempSrc: Oral     SpO2:   96% 96%  Height:        Constitutional: NAD, calm, comfortable Eyes: PERRL, lids and conjunctivae normal ENMT: Mucous membranes are moist. Posterior pharynx clear of any exudate or lesions.Normal dentition.  Neck: normal, supple, no masses, no thyromegaly Respiratory: clear to auscultation bilaterally, no wheezing, no crackles. Normal respiratory effort. No accessory muscle  use.  Cardiovascular: Regular rate and rhythm, no murmurs / rubs / gallops. No extremity edema. 2+ pedal pulses. No carotid bruits.  Abdomen: no tenderness, no masses palpated. No hepatosplenomegaly. Bowel sounds positive.  Musculoskeletal: no clubbing / cyanosis. No joint deformity upper and lower extremities. Good ROM, no contractures. Normal muscle tone.  Skin: no rashes, lesions, ulcers. No induration Neurologic: CN 2-12 grossly intact. Sensation intact, DTR normal. Strength 5/5 in all 4.  Psychiatric: Normal judgment and insight. Alert and oriented x 3. Normal mood.    Labs on Admission: I have personally reviewed following labs and imaging studies  CBC: Recent Labs  Lab 11/07/19 2119  WBC 3.7*  NEUTROABS 2.4  HGB  13.6  HCT 43.8  MCV 78.9*  PLT 893*   Basic Metabolic Panel: Recent Labs  Lab 11/07/19 2119  NA 139  K 4.3  CL 106  CO2 21*  GLUCOSE 116*  BUN 41*  CREATININE 3.60*  CALCIUM 9.2   GFR: CrCl cannot be calculated (Unknown ideal weight.). Liver Function Tests: Recent Labs  Lab 11/07/19 2119  AST 24  ALT 16  ALKPHOS 42  BILITOT 0.7  PROT 7.5  ALBUMIN 3.1*   Recent Labs  Lab 11/07/19 2119  LIPASE 44   No results for input(s): AMMONIA in the last 168 hours. Coagulation Profile: No results for input(s): INR, PROTIME in the last 168 hours. Cardiac Enzymes: No results for input(s): CKTOTAL, CKMB, CKMBINDEX, TROPONINI in the last 168 hours. BNP (last 3 results) No results for input(s): PROBNP in the last 8760 hours. HbA1C: No results for input(s): HGBA1C in the last 72 hours. CBG: No results for input(s): GLUCAP in the last 168 hours. Lipid Profile: No results for input(s): CHOL, HDL, LDLCALC, TRIG, CHOLHDL, LDLDIRECT in the last 72 hours. Thyroid Function Tests: No results for input(s): TSH, T4TOTAL, FREET4, T3FREE, THYROIDAB in the last 72 hours. Anemia Panel: No results for input(s): VITAMINB12, FOLATE, FERRITIN, TIBC, IRON, RETICCTPCT in  the last 72 hours. Urine analysis:    Component Value Date/Time   COLORURINE YELLOW 01/07/2014 0015   APPEARANCEUR CLEAR 01/07/2014 0015   LABSPEC 1.025 01/07/2014 0015   PHURINE 6.0 01/07/2014 0015   GLUCOSEU NEGATIVE 01/07/2014 0015   HGBUR NEGATIVE 01/07/2014 0015   BILIRUBINUR NEGATIVE 01/07/2014 0015   KETONESUR NEGATIVE 01/07/2014 0015   PROTEINUR NEGATIVE 01/07/2014 0015   UROBILINOGEN 1.0 01/07/2014 0015   NITRITE NEGATIVE 01/07/2014 0015   LEUKOCYTESUR NEGATIVE 01/07/2014 0015    Radiological Exams on Admission: DG Chest Port 1 View  Result Date: 11/07/2019 CLINICAL DATA:  Fever EXAM: PORTABLE CHEST 1 VIEW COMPARISON:  05/30/2019 FINDINGS: Heart is normal size. Prior median sternotomy. No confluent opacities or effusions. No acute bony abnormality. IMPRESSION: No active disease. Electronically Signed   By: Rolm Baptise M.D.   On: 11/07/2019 22:15   DG Tibia/Fibula Left Port  Result Date: 11/07/2019 CLINICAL DATA:  Fever.  Wound infection EXAM: PORTABLE LEFT TIBIA AND FIBULA - 2 VIEW COMPARISON:  None. FINDINGS: Soft tissue defect/wound within the lateral soft tissues. Vascular calcifications. No acute bony abnormality. No bone destruction to suggest osteomyelitis. IMPRESSION: Lateral soft tissue wound.  No bony abnormality. Electronically Signed   By: Rolm Baptise M.D.   On: 11/07/2019 22:16   CT Renal Stone Study  Result Date: 11/08/2019 CLINICAL DATA:  Flank pain, diarrhea EXAM: CT ABDOMEN AND PELVIS WITHOUT CONTRAST TECHNIQUE: Multidetector CT imaging of the abdomen and pelvis was performed following the standard protocol without IV contrast. COMPARISON:  MRI abdomen Feb 19, 2015 FINDINGS: Lower chest: The visualized heart size within normal limits. No pericardial fluid/thickening. There is a small hiatal hernia. Minimal bibasilar dependent atelectasis is noted. Hepatobiliary: Although limited due to the lack of intravenous contrast, normal in appearance without gross focal  abnormality. No evidence of calcified gallstones or biliary ductal dilatation. Pancreas:  Unremarkable.  No surrounding inflammatory changes. Spleen: Normal in size. Although limited due to the lack of intravenous contrast, normal in appearance. Adrenals/Urinary Tract: Both adrenal glands appear normal. There is atrophy of the bilateral native kidneys. There is a low-density lesion seen off the upper pole of the left kidney measuring 1.3 cm. No native kidney hydronephrosis is seen. There  is a left lower pelvic renal transplant. There is mild pelviectasis as on a prior MRI. Scattered small tiny calcifications are seen. The largest measuring 2 mm in the lower pole. No ureteral dilatation is seen. There is mild bladder wall thickening seen diffusely. Minimal fat stranding changes seen around the superior bladder. Stomach/Bowel: The stomach, small bowel, and colon are normal in appearance. No inflammatory changes or obstructive findings. Scattered colonic diverticula are noted without diverticulitis. Vascular/Lymphatic: There are no enlarged abdominal or pelvic lymph nodes. Scattered dense aortic atherosclerotic calcifications are seen without aneurysmal dilatation. Reproductive: The prostate is unremarkable. Other: There is a small rounded calcified nodule seen in the anterior left lower pelvis, likely area of fat necrosis. Musculoskeletal: No acute or significant osseous findings. Degenerative changes are seen throughout the thoracolumbar spine. IMPRESSION: Unchanged mild pelviectasis of the left renal transplant with nonobstructing tiny renal calcifications. Findings which could be suggestive of cystitis. Diverticulosis without diverticulitis. Aortic Atherosclerosis (ICD10-I70.0). Electronically Signed   By: Prudencio Pair M.D.   On: 11/08/2019 00:47    EKG: Independently reviewed.  Assessment/Plan Principal Problem:   COVID-19 virus infection Active Problems:   Renal transplant recipient   Essential  hypertension   Type 2 diabetes mellitus with vascular disease ([place])   AKI (acute kidney injury) ([place])   History of DVT (deep vein thrombosis)    1. COVID-19 1. Main symptoms seems to be GI and fever at this time, no O2 requirement 2. COVID pathway 3. Remdesivir 4. Daily labs 5. Cont pulse ox 6. Tele monitor 2. AKI of renal transplant - 1. Likely pre-renal due to diarrhea from COVID-19 2. Spoke with Dr. Carolin Sicks 1. US renal 2. Hold mycophenolate 3. Continue tacrolimus 4. Call them back if renal function doesn't improve 3. Will triple up on his home prednisone (so 15mg  daily) first dose now; but dont think he needs full stress dose steroids just yet. 4. BP actually running high side of normal (932T and up systolic). 5. IVF: 1L Bolus in ED and NS at 75 cc/hr 6. Repeat CMP in AM 7. Strict intake and output 8. Hold neurontin 9. Continue chronic PO bicarb 3. H/o DVT - 1. Had DVT found during Dec admit at [facility] per the DC summary 2. Hold Xarelto in setting of AKI 3. Use heparin gtt instead 4. HTN - 1. DC summary from [place] just had him on 12.5mg  metoprolol BID as only BP med in contrast to Aug DC from here 2. Will hold off on ordering BP meds for now in acutely ill patient 3. Monitor BP and treat as needed 5. CAD - 1. Cont statin 2. Cont ranexa 6. DM2 - 1. Seems to be a chart diagnosis, not on any chronic meds for this 2. But given steroid increase, will put him on SSI sensitive scale Q4H for now  DVT prophylaxis: Heparin gtt Code Status: Full Family Communication: No family in room Disposition Plan: Home after admit Consults called: Spoke with Dr. Carolin Sicks, call them back if kidney not improving Admission status: Admit to inpatient  Severity of Illness: The appropriate patient status for this patient is INPATIENT. Inpatient status is judged to be reasonable and necessary in order to provide the required intensity of service to ensure the patient's safety. The patient's  presenting symptoms, physical exam findings, and initial radiographic and laboratory data in the context of their chronic comorbidities is felt to place them at high risk for further clinical deterioration. Furthermore, it is not anticipated that the patient will  be medically stable for discharge from the hospital within 2 midnights of admission. The following factors support the patient status of inpatient.   IP status due to acute kidney failure with creat 3.6 up from 0.9 baseline in a renal transplant patient in setting of COVID-19.  * I certify that at the point of admission it is my clinical judgment that the patient will require inpatient hospital care spanning beyond 2 midnights from the point of admission due to high intensity of service, high risk for further deterioration and high frequency of surveillance required.*    Theodore Arellano M. DO [facility]  How to contact the [facility] Attending or Consulting provider [place] or covering provider during after hours [place], for this patient?  1. Check the care team in [facility] and look for a) attending/consulting [facility] provider listed and b) the [facility] team listed 2. Log into [email or web address]  Amion Physician Scheduling and messaging for groups and whole hospitals  On call and physician scheduling software for group practices, residents, hospitalists and other medical providers for call, clinic, rotation and shift schedules. OnCall Enterprise is a hospital-wide system for scheduling doctors and paging doctors on call. EasyPlot is for scientific plotting and data analysis.  [email or web address]  and use [place]'s universal password to access. If you do not have the password, please contact the hospital operator.  3. Locate the [facility] provider you are looking for under [facility] and page to a number that you can be directly reached. 4. If you still have difficulty reaching the provider, please page the [facility] (Director on Call) for the Hospitalists listed  on amion for assistance.  11/08/2019, 2:13 AM

## 2019-11-08 NOTE — ED Notes (Signed)
+  Tele, [place] carelink for pt going to [facility]

## 2019-11-08 NOTE — Progress Notes (Signed)
Iv start as per iv flowsheet. L upper arm with infiltration from previous IV prior to my arrival.

## 2019-11-08 NOTE — Progress Notes (Signed)
[facility] wants stable IV before transfer.  Spoke with [employer].  ED staff to speak with Dr Alcario Drought about PICC.

## 2019-11-08 NOTE — Progress Notes (Signed)
Admitted earlier this morning by Dr. Roda Shutters see his note for further details.  Brief narrative: 59 year old with history of kidney transplant, PAD with chronic left leg ulceration/wound VAC in place, DVT on anticoagulation-who presented with 2-day history of diarrhea, was found to have COVID-19 infection and AKI.  Subjective: Lying comfortably in bed-complains of generalized myalgias.  Diarrhea has improved.  No vomiting.  Objective: Blood pressure (!) 147/80, pulse 89, temperature (!) 102.6 F (39.2 C), temperature source Oral, resp. rate 18, height 5\' 8"  (1.727 m), SpO2 98 %. Chest: Clear to auscultation anteriorly CVS: S1-S2 regular Abdomen: Soft nontender nondistended Extremities: No edema-appears to have a chronic wound in his left leg-it is bandaged-did not open. Neurology: Seems to be moving all 4 extremities.  Assessment and plan: AKI-history of renal transplant patient on immunosuppressive's: AKI hemodynamically mediated in the setting of large volume diarrhea.  Hydrate with IV fluids-hold Myfortic-continue steroids and tacrolimus.  Briefly curbside with Dr. Talbert Cage recommended above-and asked [place] to touch base with nephrology service if his renal function does not improve.  Diarrhea: Likely secondary to Covid-start as needed Imodium.  COVID-19 infection: Not hypoxic-chest x-ray without pneumonia-on remdesivir-already on steroids for transplant.  Continue remdesivir.  Inflammatory markers/procalcitonin pending  Hypertension: Continue to hold all antihypertensives  DVT: Xarelto on hold-on IV heparin for now given renal function.  See H&P for further details.  No charge note.

## 2019-11-08 NOTE — ED Notes (Signed)
Per pharmacy, treat with warm compress to IV infiltration site.

## 2019-11-08 NOTE — Progress Notes (Signed)
ANTICOAGULATION CONSULT NOTE - Initial Consult  Pharmacy Consult for Heparin Indication: h/o DVT (last month)  Allergies  Allergen Reactions  . Percocet [Oxycodone-Acetaminophen] Hives and Itching  . Vicodin [Hydrocodone-Acetaminophen] Hives and Itching  . Shellfish Allergy Hives  . Tomato Hives    Patient Measurements: Height: 5\' 8"  (172.7 cm) IBW/kg (Calculated) : 68.4  Wt: 83 kg  Vital Signs: Temp: 98.7 F (37.1 C) (02/07 0000) Temp Source: Oral (02/07 0000) BP: 139/92 (02/07 0100) Pulse Rate: 84 (02/07 0100)  Labs: Recent Labs    11/07/19 2119  HGB 13.6  HCT 43.8  PLT 101*  CREATININE 3.60*    CrCl cannot be calculated (Unknown ideal weight.).   Medical History: Past Medical History:  Diagnosis Date  . Anxiety   . Arthritis    "lower back" (11/28/2017)  . CAD (coronary artery disease)   . Chronic lower back pain   . H/O immunosuppressive therapy    chronic/notes 11/28/2017  . History of gout    "before kidney transport" (11/28/2017)  . Hyperlipidemia   . Hypertension   . Kidney disease    s/p kidney transplant 2011; "not on dialysis now" (11/28/2017)    Medications:  Awaiting electronic med rec  Assessment: 59 y.o. M presents with diarrhea. Also noted pt with recent diagnosis of COVID+. Pt on Xarelto PTA for recent DVT (1 mos ago) - last dose 2/4 pm. Pt now with AKI (SCr up to 3.6) so holding Xarelto and will use heparin for now.  Xarelto may still be affecting heparin levels so will utilize PTT for monitoring until levels correlating.  Goal of Therapy:  Heparin level 0.3-0.7 units/ml; 66-102 sec Monitor platelets by anticoagulation protocol: Yes   Plan:  Heparin IV bolus 4000 units Heparin gtt at 1150 units/hr Will f/u heparin level and PTT in 8 hours Daily heparin level, PTT, and CBC  Sherlon Handing, PharmD, BCPS Please see amion for complete clinical pharmacist phone list 11/08/2019,1:28 AM

## 2019-11-08 NOTE — Procedures (Signed)
Central Venous Catheter Insertion Procedure Note Theodore Arellano [id number] 1961/05/04  Procedure: Insertion of Central Venous Catheter Indications: Assessment of intravascular volume, Drug and/or fluid administration and Frequent blood sampling  Procedure Details Consent: Risks of procedure as well as the alternatives and risks of each were explained to the (patient/caregiver).  Consent for procedure obtained. Time Out: Verified patient identification, verified procedure, site/side was marked, verified correct patient position, special equipment/implants available, medications/allergies/relevent history reviewed, required imaging and test results available.  Performed  Maximum sterile technique was used including antiseptics, cap, gloves, gown, hand hygiene, mask and sheet. Skin prep: Chlorhexidine; local anesthetic administered A antimicrobial bonded/coated triple lumen catheter was placed in the left internal jugular vein using the Seldinger technique.  Evaluation Blood flow good Complications: No apparent complications Patient did tolerate procedure well. Chest X-ray ordered to verify placement.  CXR: pending.  [name] MD [facility] 11/08/2019, 5:45 PM

## 2019-11-08 NOTE — ED Notes (Signed)
Report called to Fredrik Rigger at [facility]

## 2019-11-08 NOTE — ED Notes (Signed)
Called to bedside by NT Darian for possible IV infiltration. Upon inspection, patient's IV has infiltrated with Remdesivir infusion. Notified MD. Hulen Skains pharmacy for protocol and to see if med is a vesicant.

## 2019-11-08 NOTE — Progress Notes (Addendum)
Continuing to have difficulty getting and maintaining IV access for past couple of hours now, several peripherals have infiltrated.  PICC line ordered.  Looks like patient had CT renal stone protocol done just before I saw him in ED.  Will go ahead and cancel the Renal US.

## 2019-11-09 LAB — URINALYSIS, ROUTINE W REFLEX MICROSCOPIC
Bilirubin Urine: NEGATIVE
Glucose, UA: NEGATIVE mg/dL
Hgb urine dipstick: NEGATIVE
Ketones, ur: NEGATIVE mg/dL
Leukocytes,Ua: NEGATIVE
Nitrite: NEGATIVE
Protein, ur: 30 mg/dL — AB
Specific Gravity, Urine: 1.02 (ref 1.005–1.030)
pH: 5 (ref 5.0–8.0)

## 2019-11-09 LAB — CBC WITH DIFFERENTIAL/PLATELET
Abs Immature Granulocytes: 0.07 10*3/uL (ref 0.00–0.07)
Basophils Absolute: 0 10*3/uL (ref 0.0–0.1)
Basophils Relative: 1 %
Eosinophils Absolute: 0 10*3/uL (ref 0.0–0.5)
Eosinophils Relative: 0 %
HCT: 36.7 % — ABNORMAL LOW (ref 39.0–52.0)
Hemoglobin: 11.6 g/dL — ABNORMAL LOW (ref 13.0–17.0)
Immature Granulocytes: 4 %
Lymphocytes Relative: 21 %
Lymphs Abs: 0.4 10*3/uL — ABNORMAL LOW (ref 0.7–4.0)
MCH: 24.4 pg — ABNORMAL LOW (ref 26.0–34.0)
MCHC: 31.6 g/dL (ref 30.0–36.0)
MCV: 77.3 fL — ABNORMAL LOW (ref 80.0–100.0)
Monocytes Absolute: 0.2 10*3/uL (ref 0.1–1.0)
Monocytes Relative: 10 %
Neutro Abs: 1.3 10*3/uL — ABNORMAL LOW (ref 1.7–7.7)
Neutrophils Relative %: 64 %
Platelets: 92 10*3/uL — ABNORMAL LOW (ref 150–400)
RBC: 4.75 MIL/uL (ref 4.22–5.81)
RDW: 18.2 % — ABNORMAL HIGH (ref 11.5–15.5)
WBC: 2 10*3/uL — ABNORMAL LOW (ref 4.0–10.5)
nRBC: 0 % (ref 0.0–0.2)

## 2019-11-09 LAB — COMPREHENSIVE METABOLIC PANEL
ALT: 14 U/L (ref 0–44)
AST: 21 U/L (ref 15–41)
Albumin: 2.9 g/dL — ABNORMAL LOW (ref 3.5–5.0)
Alkaline Phosphatase: 34 U/L — ABNORMAL LOW (ref 38–126)
Anion gap: 10 (ref 5–15)
BUN: 46 mg/dL — ABNORMAL HIGH (ref 6–20)
CO2: 19 mmol/L — ABNORMAL LOW (ref 22–32)
Calcium: 8.8 mg/dL — ABNORMAL LOW (ref 8.9–10.3)
Chloride: 109 mmol/L (ref 98–111)
Creatinine, Ser: 1.81 mg/dL — ABNORMAL HIGH (ref 0.61–1.24)
GFR calc Af Amer: 46 mL/min — ABNORMAL LOW (ref >60)
GFR calc non Af Amer: 40 mL/min — ABNORMAL LOW (ref >60)
Glucose, Bld: 123 mg/dL — ABNORMAL HIGH (ref 70–99)
Potassium: 4.3 mmol/L (ref 3.5–5.1)
Sodium: 138 mmol/L (ref 135–145)
Total Bilirubin: 0.6 mg/dL (ref 0.3–1.2)
Total Protein: 6.8 g/dL (ref 6.5–8.1)

## 2019-11-09 LAB — GLUCOSE, CAPILLARY
Glucose-Capillary: 117 mg/dL — ABNORMAL HIGH (ref 70–99)
Glucose-Capillary: 118 mg/dL — ABNORMAL HIGH (ref 70–99)
Glucose-Capillary: 120 mg/dL — ABNORMAL HIGH (ref 70–99)
Glucose-Capillary: 169 mg/dL — ABNORMAL HIGH (ref 70–99)
Glucose-Capillary: 202 mg/dL — ABNORMAL HIGH (ref 70–99)

## 2019-11-09 LAB — C-REACTIVE PROTEIN: CRP: 14.9 mg/dL — ABNORMAL HIGH (ref <1.0)

## 2019-11-09 LAB — IRON AND TIBC
Iron: 30 ug/dL — ABNORMAL LOW (ref 45–182)
Saturation Ratios: 19 % (ref 17.9–39.5)
TIBC: 155 ug/dL — ABNORMAL LOW (ref 250–450)
UIBC: 125 ug/dL

## 2019-11-09 LAB — HEMOGLOBIN A1C
Hgb A1c MFr Bld: 6.5 % — ABNORMAL HIGH (ref 4.8–5.6)
Mean Plasma Glucose: 139.85 mg/dL

## 2019-11-09 LAB — APTT
aPTT: >200 seconds (ref 24–36)
aPTT: 176 seconds (ref 24–36)

## 2019-11-09 LAB — D-DIMER, QUANTITATIVE: D-Dimer, Quant: 2.85 ug/mL-FEU — ABNORMAL HIGH (ref 0.00–0.50)

## 2019-11-09 LAB — HEPARIN LEVEL (UNFRACTIONATED)
Heparin Unfractionated: 0.71 IU/mL — ABNORMAL HIGH (ref 0.30–0.70)
Heparin Unfractionated: 0.37 IU/mL (ref 0.30–0.70)

## 2019-11-09 MED ORDER — HEPARIN (PORCINE) 25000 UT/250ML-% IV SOLN: 1050.0000 [IU]/h | INTRAVENOUS | Status: DC

## 2019-11-09 MED ORDER — SODIUM CHLORIDE 0.9 % IV SOLN: INTRAVENOUS | Status: DC

## 2019-11-09 MED ORDER — METOPROLOL TARTRATE 25 MG PO TABS
25.0000 mg | ORAL_TABLET | Freq: Two times a day (BID) | ORAL | Status: DC
  Administered 2019-11-09 (×2): 25 mg via ORAL
  Filled 2019-11-09 (×2): qty 1

## 2019-11-09 MED ORDER — RIVAROXABAN 2.5 MG PO TABS: 2.5000 mg | ORAL_TABLET | Freq: Two times a day (BID) | ORAL | Status: DC

## 2019-11-09 MED ORDER — GABAPENTIN 100 MG PO CAPS: 200.0000 mg | ORAL_CAPSULE | Freq: Three times a day (TID) | ORAL | Status: DC

## 2019-11-09 MED ORDER — SODIUM ZIRCONIUM CYCLOSILICATE 10 G PO PACK
10.0000 g | PACK | Freq: Once | ORAL | Status: DC
  Filled 2019-11-09: qty 1

## 2019-11-09 MED ORDER — TAMSULOSIN HCL 0.4 MG PO CAPS
0.4000 mg | ORAL_CAPSULE | Freq: Every day | ORAL | Status: DC
  Administered 2019-11-09 – 2019-11-10 (×3): 0.4 mg via ORAL
  Filled 2019-11-09 (×3): qty 1

## 2019-11-09 MED ORDER — GABAPENTIN 100 MG PO CAPS
200.0000 mg | ORAL_CAPSULE | Freq: Three times a day (TID) | ORAL | Status: DC
  Administered 2019-11-09 – 2019-11-11 (×8): 200 mg via ORAL
  Filled 2019-11-09 (×8): qty 2

## 2019-11-09 MED ORDER — HEPARIN (PORCINE) 25000 UT/250ML-% IV SOLN
900.0000 [IU]/h | INTRAVENOUS | Status: DC
  Administered 2019-11-10: 11:00:00 900 [IU]/h via INTRAVENOUS
  Filled 2019-11-09: qty 250

## 2019-11-09 NOTE — Consult Note (Addendum)
[facility] Nurse wound consult note Consultation was completed by review of records, images and assistance from the bedside nurse/clinical staff.    Reason for Consult: Wound VAC Present from home; leg wound with previous grafting. Managed by [facility] and outside facility MD  Wound type: surgical  Pressure Injury POA: NA Measurement:see nursing flowsheet Wound bed: seen nursing flow sheet; nurse to change dressing when supplies/equipment arrives to the room Drainage (amount, consistency, odor)  Periwound:intact  Dressing procedure/placement/frequency: NPWT dressings to be changed by bedside staff M/W/F Continuous/ 159mmHG.  Patient's home machine to be left in the patients room; will need new canister for home transfer To be placed on hospital NPWT equipment.  Discussed with bedside nurse. Ordered supplies and equipment for nurse  Discussed POC with bedside nurse.  Re consult if needed, will not follow at this time. Thanks  Charlyn Vialpando [employer], RN,CWOCN, CNS, [place] [phone])

## 2019-11-09 NOTE — Progress Notes (Signed)
Physical Therapy Evaluation Patient Details Name: Theodore Arellano MRN: [id number] DOB: 1960-10-14 Today's Date: 11/09/2019   History of Present Illness  Pt is a 59 y/o M, who presents to ED on 11/07/19 secondary to diarrhea and fever. He was diagnosed at an outside facility with COVID-19 (per md record date unknown), and transfered to [facility] for further management on 11/08/19. His past medical history is significant for Renal transplant, DM2, HKI, History of DVT, current smoker.  Clinical Impression  Pt demonstrates good functional mobility, requiring assistance mainly for line management and safety. At current level main assistance required at home upon discharge would be assistance with cleaning due to fatigue levels and safety with dynamic balance. Pt states he would be too embarrassed for other home health services at this time. He was able to help manage lines during ambulation and showed no near LOB with RW with stable vitals.     Follow Up Recommendations No PT follow up    Equipment Recommendations  3in1 (PT)    Recommendations for Other Services Other (comment)(House cleaner/assistance)     Precautions / Restrictions Precautions Precautions: Other (comment);Fall Precaution Comments: Airborne Restrictions Weight Bearing Restrictions: No      Mobility  Bed Mobility Overal bed mobility: Modified Independent             General bed mobility comments: use rails and bed to sit up and move to EOB  Transfers Overall transfer level: Modified independent Equipment used: Rolling walker (2 wheeled)                Ambulation/Gait Ambulation/Gait assistance: Modified independent (Device/Increase time)   Assistive device: Rolling walker (2 wheeled) Gait Pattern/deviations: Step-through pattern;Shuffle;Wide base of support     General Gait Details: Pt required intermittent cues for increased step length and speed  Stairs            Wheelchair Mobility    Modified  Rankin (Stroke Patients Only)       Balance Overall balance assessment: Mild deficits observed, not formally tested   Sitting balance-Leahy Scale: Good       Standing balance-Leahy Scale: Fair                               Pertinent Vitals/Pain      Home Living Family/patient expects to be discharged to:: Private residence Living Arrangements: Parent(Parent on hospice) Available Help at Discharge: Other (Comment)(none, states house is so bad unable to clean) Type of Home: House(no stairs to enter) Home Access: Level entry     Home Layout: One level Home Equipment: Cane - single point;Walker - 2 wheels      Prior Function Level of Independence: Independent with assistive device(s)(Difficulty cleaning, SPC intermittently)               Hand Dominance   Dominant Hand: Right    Extremity/Trunk Assessment                Communication   Communication: No difficulties  Cognition Arousal/Alertness: Awake/alert Behavior During Therapy: WFL for tasks assessed/performed Overall Cognitive Status: Within Functional Limits for tasks assessed                                        General Comments      Exercises Other Exercises Other Exercises: Spirometry maxed x5 Other Exercises:  pursed lip breathing encouraged throughout   Assessment/Plan    PT Assessment Patient needs continued PT services  PT Problem List Decreased strength;Decreased range of motion;Decreased activity tolerance;Decreased mobility;Decreased knowledge of use of DME;Decreased safety awareness;Cardiopulmonary status limiting activity;Decreased skin integrity       PT Treatment Interventions DME instruction;Gait training;Stair training;Functional mobility training;Therapeutic activities;Therapeutic exercise;Balance training;Neuromuscular re-education;Cognitive remediation;Patient/family education;Manual techniques    PT Goals (Current goals can be found in the  Care Plan section)  Acute Rehab PT Goals Patient Stated Goal: Return home PT Goal Formulation: With patient Time For Goal Achievement: 11/23/19 Potential to Achieve Goals: Good    Frequency Min 3X/week   Barriers to discharge Decreased caregiver support      Co-evaluation               AM-PAC PT "6 Clicks" Mobility  Outcome Measure Help needed turning from your back to your side while in a flat bed without using bedrails?: None Help needed moving from lying on your back to sitting on the side of a flat bed without using bedrails?: A Little Help needed moving to and from a bed to a chair (including a wheelchair)?: A Little Help needed standing up from a chair using your arms (e.g., wheelchair or bedside chair)?: A Little Help needed to walk in hospital room?: A Little Help needed climbing 3-5 steps with a railing? : A Lot 6 Click Score: 18    End of Session Equipment Utilized During Treatment: Gait belt;Oxygen(RW) Activity Tolerance: Patient limited by fatigue Patient left: in chair;with call bell/phone within reach Nurse Communication: Mobility status;Other (comment)(notified of IV air in line) PT Visit Diagnosis: Unsteadiness on feet (R26.81);Other abnormalities of gait and mobility (R26.89);Muscle weakness (generalized) (M62.81);Difficulty in walking, not elsewhere classified (R26.2)    Time: 1610-9604 PT Time Calculation (min) (ACUTE ONLY): 35 min   Charges:      Gait training 8-22 min        Ann Held PT, DPT [place] P: [place] 11/09/2019, 5:35 PM

## 2019-11-09 NOTE — Progress Notes (Signed)
ANTICOAGULATION CONSULT NOTE - [place] for Heparin Indication: h/o DVT (last month) - while Xarelto on hold  Allergies  Allergen Reactions  . Percocet [Oxycodone-Acetaminophen] Hives and Itching  . Vicodin [Hydrocodone-Acetaminophen] Hives and Itching  . Shellfish Allergy Hives  . Tomato Hives    Patient Measurements: Height: 5\' 8"  (172.7 cm) Weight: 182 lb 1.6 oz (82.6 kg) IBW/kg (Calculated) : 68.4  Wt: 83 kg  Vital Signs: Temp: 98.4 F (36.9 C) (02/08 0400) Temp Source: Oral (02/08 0400) BP: 137/91 (02/08 0036) Pulse Rate: 65 (02/08 0036)  Labs: Recent Labs    11/07/19 2119 11/07/19 2119 11/08/19 1030 11/09/19 0530  HGB 13.6   < > 13.4 11.6*  HCT 43.8  --  41.3 36.7*  PLT 101*  --  88* 92*  APTT  --   --  179* >200*  HEPARINUNFRC  --   --  0.69 0.71*  CREATININE 3.60*  --  2.46* 1.81*   < > = values in this interval not displayed.    Estimated Creatinine Clearance: 46.1 mL/min (A) (by C-G formula based on SCr of 1.81 mg/dL (H)).   Medical History: Past Medical History:  Diagnosis Date  . Anxiety   . Arthritis    "lower back" (11/28/2017)  . CAD (coronary artery disease)   . Chronic lower back pain   . H/O immunosuppressive therapy    chronic/notes 11/28/2017  . History of gout    "before kidney transport" (11/28/2017)  . Hyperlipidemia   . Hypertension   . Kidney disease    s/p kidney transplant 2011; "not on dialysis now" (11/28/2017)    Medications:    Assessment: 59 y.o. M presents with diarrhea. Also noted pt with recent diagnosis of COVID+. Pt on Xarelto PTA for recent DVT (1 mos ago) - last dose 2/4 pm. Pt now with AKI on admission  (SCr up to 3.6) so holding Xarelto and will use heparin for now.  PTT > 200 seconds this AM, likely holding on to Xarelto with AKI HgB stable  Goal of Therapy:  Heparin level 0.3-0.7 units/ml; 66-102 sec Monitor platelets by anticoagulation protocol: Yes   Plan:  Hold heparin x 1  hour Decrease heparin rate to 1050 units / hr Check 8 hr HL and PTT Daily HL/PTT  Thank you Anette Guarneri, PharmD 11/09/2019 8:17 AM

## 2019-11-09 NOTE — Progress Notes (Addendum)
Occupational Therapy Evaluation Patient Details Name: Theodore Arellano MRN: [id number] DOB: 01-08-1961 Today's Date: 11/09/2019    History of Present Illness Pt is a 59 y/o M, who presents to ED on 11/07/19 secondary to diarrhea and fever. He was diagnosed at an outside facility with COVID-19 (per md record date unknown), and transfered to [facility] for further management on 11/08/19. His past medical history is significant for Renal transplant, DM2, HKI, History of DVT, current smoker.   Clinical Impression   PTA Patient resides in a single story home with mother, but mother is currently receiving hospice services. Patient will be alone at home with ability to receive assistance from adult children as needed with IADL tasks. Patient's O2 stats remained in the 90s throughout O2 tx session. Patient was only able to mobilize from recliner to bed with Minimal Assist for steadying and extra time due to discomfort in L LE. Patient instructed on safety with wound vac management and breathing techniques during mobility. Patient was able to complete grooming skills at Setup level while sitting only. Incentive Spirometer exercises 5 times at 2500ML. Recommend HHOT services and continued skilled acute OT services to decrease burden of care upon d/c with ADL tasks.       Follow Up Recommendations  Home health OT    Equipment Recommendations  3 in 1 bedside commode    Recommendations for Other Services       Precautions / Restrictions Precautions Precautions: Other (comment);Fall(Airborne) Restrictions Weight Bearing Restrictions: No      Mobility Bed Mobility Overal bed mobility: Modified Independent( use of bed rail )             General bed mobility comments: use rails and bed to sit up and move to EOB  Transfers Overall transfer level: Modified independent Equipment used: Rolling walker (2 wheeled)                  Balance Overall balance assessment: Needs assistance   Sitting  balance-Leahy Scale: Good       Standing balance-Leahy Scale: Fair                             ADL either performed or assessed with clinical judgement   ADL Overall ADL's : Needs assistance/impaired Eating/Feeding: Independent   Grooming: Set up   Upper Body Bathing: Set up   Lower Body Bathing: Minimal assistance   Upper Body Dressing : Set up   Lower Body Dressing: Minimal assistance   Toilet Transfer: Minimal assistance           Functional mobility during ADLs: Minimal assistance       Vision Baseline Vision/History: No visual deficits Patient Visual Report: No change from baseline       Perception     Praxis      Pertinent Vitals/Pain Pain Assessment: No/denies pain     Hand Dominance Right   Extremity/Trunk Assessment Upper Extremity Assessment Upper Extremity Assessment: RUE deficits/detail;Generalized weakness;LUE deficits/detail RUE: (Grossly 3+/5 for R UE, AROM WFL) LUE: (Grossly 3+/5 for R UE, AROM WFL)   Lower Extremity Assessment Lower Extremity Assessment: Defer to PT evaluation       Communication Communication Communication: No difficulties   Cognition Arousal/Alertness: Awake/alert Behavior During Therapy: WFL for tasks assessed/performed Overall Cognitive Status: Within Functional Limits for tasks assessed  General Comments  (L Lateral LE wound  requiring use of wound vac )    Exercises Exercises: Other exercises Other Exercises Other Exercises: Spirometry maxed x5   Shoulder Instructions      Home Living Family/patient expects to be discharged to:: Private residence Living Arrangements: Parent Available Help at Discharge: Other (Comment) Type of Home: House Home Access: Level entry     Home Layout: One level     Bathroom Shower/Tub: Teacher, early years/pre: Standard Bathroom Accessibility: No   Home Equipment: Cane - single  point;Walker - 2 wheels;Grab bars - tub/shower          Prior Functioning/Environment Level of Independence: Independent with assistive device(s)                 OT Problem List: Decreased strength;Decreased activity tolerance;Impaired balance (sitting and/or standing);Decreased safety awareness;Decreased knowledge of use of DME or AE;Decreased knowledge of precautions;Cardiopulmonary status limiting activity      OT Treatment/Interventions: Self-care/ADL training;Therapeutic exercise;Energy conservation;DME and/or AE instruction;Therapeutic activities;Patient/family education;Balance training    OT Goals(Current goals can be found in the care plan section) Acute Rehab OT Goals Patient Stated Goal: Return home OT Goal Formulation: With patient Time For Goal Achievement: 11/23/19 Potential to Achieve Goals: Good  OT Frequency: Min 3X/week   Barriers to D/C:            Co-evaluation              AM-PAC OT "6 Clicks" Daily Activity     Outcome Measure Help from another person eating meals?: None Help from another person taking care of personal grooming?: A Little Help from another person toileting, which includes using toliet, bedpan, or urinal?: A Little Help from another person bathing (including washing, rinsing, drying)?: A Little Help from another person to put on and taking off regular upper body clothing?: A Little Help from another person to put on and taking off regular lower body clothing?: A Little 6 Click Score: 19   End of Session Equipment Utilized During Treatment: (wound vac)  Activity Tolerance: Patient tolerated treatment well Patient left: in bed  OT Visit Diagnosis: Muscle weakness (generalized) (M62.81);Unsteadiness on feet (R26.81)                Time: 2440-1027 OT Time Calculation (min): 30 min Charges:  OT General Charges $OT Visit: 1 Visit OT Evaluation $OT Eval Moderate Complexity: 1 Mod OT Treatments $Self Care/Home Management :  8-22 mins  Tahiri Shareef OTR/L   Dessie Tatem 11/09/2019, 1:20 PM

## 2019-11-09 NOTE — Progress Notes (Signed)
ANTICOAGULATION CONSULT NOTE - [place] for Heparin Indication: h/o DVT (last month) - while Xarelto on hold  Allergies  Allergen Reactions  . Percocet [Oxycodone-Acetaminophen] Hives and Itching  . Vicodin [Hydrocodone-Acetaminophen] Hives and Itching  . Shellfish Allergy Hives  . Tomato Hives    Patient Measurements: Height: 5\' 8"  (172.7 cm) Weight: 182 lb 1.6 oz (82.6 kg) IBW/kg (Calculated) : 68.4  Wt: 83 kg  Vital Signs: Temp: 97.2 F (36.2 C) (02/08 1937) Temp Source: Oral (02/08 1937) BP: 145/84 (02/08 1937) Pulse Rate: 52 (02/08 1937)  Labs: Recent Labs    11/07/19 2119 11/07/19 2119 11/08/19 1030 11/09/19 0530 11/09/19 1835  HGB 13.6   < > 13.4 11.6*  --   HCT 43.8  --  41.3 36.7*  --   PLT 101*  --  88* 92*  --   APTT  --   --  179* >200* 176*  HEPARINUNFRC  --   --  0.69 0.71* 0.37  CREATININE 3.60*  --  2.46* 1.81*  --    < > = values in this interval not displayed.    Estimated Creatinine Clearance: 46.1 mL/min (A) (by C-G formula based on SCr of 1.81 mg/dL (H)).   Medical History: Past Medical History:  Diagnosis Date  . Anxiety   . Arthritis    "lower back" (11/28/2017)  . CAD (coronary artery disease)   . Chronic lower back pain   . H/O immunosuppressive therapy    chronic/notes 11/28/2017  . History of gout    "before kidney transport" (11/28/2017)  . Hyperlipidemia   . Hypertension   . Kidney disease    s/p kidney transplant 2011; "not on dialysis now" (11/28/2017)    Medications:    Assessment: 59 y.o. M presents with diarrhea. Also noted pt with recent diagnosis of COVID+. Pt on Xarelto PTA for recent DVT (1 mos ago) - last dose 2/4 pm. Pt now with AKI on admission  (SCr up to 3.6) so holding Xarelto and will use heparin for now.  PTT > 176 seconds (supratherapeutic) on heparin 1050 units/hr Heparin level 0.37 (therapeutic but not correlating yet) No bleeding reported per discussion with RN  Goal of  Therapy:  Heparin level 0.3-0.7 units/ml; 66-102 sec Monitor platelets by anticoagulation protocol: Yes   Plan:  Hold heparin x 1 hour Decrease heparin rate to 850 units / hr Check 8 hr HL and PTT with AM labs Daily HL/PTT  Thank you for allowing [place] to be involved in this patient's care.  Peggyann Juba, PharmD, BCPS Pharmacy: [phone] 11/09/2019 8:53 PM

## 2019-11-09 NOTE — Progress Notes (Signed)
CRITICAL VALUE ALERT  Critical Value:  PTT >200  Date & Time Notied:  11/09/2019 @8 :02  Provider Notified: Dr. Reesa Chew  Orders Received/Actions taken: awaiting orders

## 2019-11-09 NOTE — Progress Notes (Signed)
Spoke w/ [facility] nurse today over the phone. Discussed wound vac exchange d/t hospital admission. Wound vac & supplies was received this evening-however there isn't a clear order to exchange the current wound vac but is indicated in note. After discussing w/ night charge RN advised to wait until correct order is on place.  Care RN Theodore Arellano is updated on all of the above.

## 2019-11-09 NOTE — Progress Notes (Signed)
Patient family members updated over the phone late morning on speaker phone w/ patient as well. All questions & concerns were addressed.  Heparin gtt cont today PTT was elevated this am -gtt was paused for 1 hr then restarted at 10.53ml/hr see MAR for details  HR dropped down to 30's today & pt had a 2 sec pause. I notified MD Amin Ankit & he d/c metoprolol   Pt remains afebrile today A/Ox4 RA  BP elevated at times Lethargic Decreased appetite Pt worked w/ PT today in his room-see note for details. No BM today 313ml dark amber urine today  LLE Wound vac -167mmHG cont Clean Dry Intact **Machine/Canister from home is in pt room.

## 2019-11-09 NOTE — Progress Notes (Signed)
PROGRESS NOTE    Theodore Arellano  XIP:[id number] DOB: 11-Nov-1960 DOA: 11/07/2019 PCP: [name], MD   Brief Narrative:  59 year old with a history of peripheral arterial disease with chronic left lower extremity ulceration with wound VAC in place, CAD status post CABG, DVT on anticoagulation, essential hypertension, DM 2, renal transplant follows at [facility] has been having GI symptoms from COVID-19 for the past several days with poor oral intake.  Admitted to the hospital for acute kidney injury transferred to [facility].   Assessment & Plan:   Principal Problem:   COVID-19 virus infection Active Problems:   Renal transplant recipient   Essential hypertension   Type 2 diabetes mellitus with vascular disease ([place])   AKI (acute kidney injury) ([place])   History of DVT (deep vein thrombosis)  Acute kidney injury with history of renal transplant Moderate dehydration secondary to diarrhea and poor oral intake Diarrhea secondary to COVID-19 infection -Oxygen levels-room air -Remdesivir-day 2 -Decadrone/Solumedrol-day 2 -Actemra-none -Convalescent plasma-none -Antibiotics-none -procalcitonin-0.2 -Chest x-ray-central venous congestion -Supportive care-antitussive, inhalers, I-S/flutter -CODE STATUS confirmed -Vitamin C & Zinc. Prone >16hrs/day.  -Routine: Labs have been reviewed including ferritin, LDH, CRP, d-dimer, fibrinogen.  Will need to trend this lab daily. -Due to lack of IV access, central line placed 2/7  History of renal transplant -Follows a [facility].  Case discussed with on-call nephrologist on the day of admission. -Prednisone, Prograf  Peripheral arterial disease with chronic left lower extremity wound -Routine wound care.  Monitor his wound VAC  History of coronary artery disease status post CABG -Metoprolol and statin  Left lower extremity DVT -Xarelto on hold due to renal dysfunction.  Currently on IV drip Heparin drip  Diabetes  mellitus type 2 -Blood glucose in acceptable range  Hyperlipidemia -Daily statin  Essential hypertension -Metoprolol  History of BPH -Flomax   DVT prophylaxis: Heparin drip Code Status: Full code Family Communication:   Disposition Plan:   Patient From= home  Patient Anticipated D/C place= Home  Barriers= currently awaiting his GI symptoms to improve and his oral intake.  In the setting of acute kidney injury with history of renal transplant, history of stay in the hospital to be hydrated aggressively intravascularly.  Hopefully discharge in next 24/48 hours once tolerating orals adequately and renal function has improved appropriately  Consultants:   Curbside-nephrology  Procedures:   Central line placed 2/7  Antimicrobials:   Remdesivir   Subjective: Feels okay, no complaints.  Urine output slightly better today.  Review of Systems Otherwise negative except as per HPI, including: General = no fevers, chills, dizziness,  fatigue HEENT/EYES = negative for loss of vision, double vision, blurred vision,  sore throa Cardiovascular= negative for chest pain, palpitation Respiratory/lungs= negative for shortness of breath, cough, wheezing; hemoptysis,  Gastrointestinal= negative for nausea, vomiting, abdominal pain Genitourinary= negative for Dysuria MSK = Negative for arthralgia, myalgias Neurology= Negative for headache, numbness, tingling  Psychiatry= Negative for suicidal and homocidal ideation Skin= Negative for Rash   Examination:  Constitutional: Not in acute distress, dry mouth Respiratory: Bibasilar crackles Cardiovascular: Normal sinus rhythm, no rubs Abdomen: Nontender nondistended good bowel sounds Musculoskeletal: No edema noted Skin: No rashes seen Neurologic: CN 2-12 grossly intact.  And nonfocal Psychiatric: Normal judgment and insight. Alert and oriented x 3. Normal mood.   Right IJ central line placed 2/7.  Objective: Vitals:   11/08/19  1600 11/08/19 2126 11/09/19 0036 11/09/19 0400  BP: 120/86 (!) 142/108 (!) 137/91   Pulse: 76  66 65   Resp: 20 18 15    Temp: 99.5 F (37.5 C) 99.2 F (37.3 C) 99.4 F (37.4 C) 98.4 F (36.9 C)  TempSrc: Oral Oral Oral Oral  SpO2: 100% 99% 96%   Weight:      Height:        Intake/Output Summary (Last 24 hours) at 11/09/2019 0750 Last data filed at 11/09/2019 0600 Gross per 24 hour  Intake 400 ml  Output 950 ml  Net -550 ml   Filed Weights   11/08/19 1400  Weight: 82.6 kg     Data Reviewed:   CBC: Recent Labs  Lab 11/07/19 2119 11/08/19 1030 11/09/19 0530  WBC 3.7* 3.3* 2.0*  NEUTROABS 2.4 2.5 PENDING  HGB 13.6 13.4 11.6*  HCT 43.8 41.3 36.7*  MCV 78.9* 76.2* 77.3*  PLT 101* 88* 92*   Basic Metabolic Panel: Recent Labs  Lab 11/07/19 2119 11/08/19 1030 11/09/19 0530  NA 139 137 138  K 4.3 4.1 4.3  CL 106 108 109  CO2 21* 16* 19*  GLUCOSE 116* 87 123*  BUN 41* 41* 46*  CREATININE 3.60* 2.46* 1.81*  CALCIUM 9.2 8.8* 8.8*   GFR: Estimated Creatinine Clearance: 46.1 mL/min (A) (by C-G formula based on SCr of 1.81 mg/dL (H)). Liver Function Tests: Recent Labs  Lab 11/07/19 2119 11/09/19 0530  AST 24 21  ALT 16 14  ALKPHOS 42 34*  BILITOT 0.7 0.6  PROT 7.5 6.8  ALBUMIN 3.1* 2.9*   Recent Labs  Lab 11/07/19 2119  LIPASE 44   No results for input(s): AMMONIA in the last 168 hours. Coagulation Profile: No results for input(s): INR, PROTIME in the last 168 hours. Cardiac Enzymes: No results for input(s): CKTOTAL, CKMB, CKMBINDEX, TROPONINI in the last 168 hours. BNP (last 3 results) No results for input(s): PROBNP in the last 8760 hours. HbA1C: Recent Labs    11/08/19 1030  HGBA1C 6.5*   CBG: Recent Labs  Lab 11/08/19 1214 11/08/19 1627 11/08/19 1947 11/09/19 0033 11/09/19 0433  GLUCAP 88 96 127* 117* 118*   Lipid Profile: No results for input(s): CHOL, HDL, LDLCALC, TRIG, CHOLHDL, LDLDIRECT in the last 72 hours. Thyroid Function  Tests: No results for input(s): TSH, T4TOTAL, FREET4, T3FREE, THYROIDAB in the last 72 hours. Anemia Panel: No results for input(s): VITAMINB12, FOLATE, FERRITIN, TIBC, IRON, RETICCTPCT in the last 72 hours. Sepsis Labs: Recent Labs  Lab 11/07/19 2120 11/08/19 1030  PROCALCITON  --  0.28  LATICACIDVEN 1.7  --     Recent Results (from the past 240 hour(s))  Blood culture (routine x 2)     Status: None (Preliminary result)   Collection Time: 11/07/19  9:20 PM   Specimen: BLOOD  Result Value Ref Range Status   Specimen Description BLOOD SITE NOT SPECIFIED  Final   Special Requests   Final    BOTTLES DRAWN AEROBIC AND ANAEROBIC Blood Culture adequate volume   Culture NO GROWTH < 12 HOURS  Final   Report Status PENDING  Incomplete  Blood culture (routine x 2)     Status: None (Preliminary result)   Collection Time: 11/07/19  9:40 PM   Specimen: BLOOD LEFT FOREARM  Result Value Ref Range Status   Specimen Description BLOOD LEFT FOREARM  Final   Special Requests   Final    BOTTLES DRAWN AEROBIC AND ANAEROBIC Blood Culture adequate volume   Culture  Setup Time   Final    GRAM POSITIVE COCCI IN BOTH AEROBIC AND ANAEROBIC  BOTTLES CRITICAL RESULT CALLED TO, READ BACK BY AND VERIFIED WITH: PHARMD M. PHAM [phone] FCP    Culture   Final    CULTURE REINCUBATED FOR BETTER GROWTH Performed at [facility], [place] [address] [place], [place] [address]    Report Status PENDING  Incomplete  Respiratory Panel by RT PCR (Flu A&B, Covid) - Nasopharyngeal Swab     Status: Abnormal   Collection Time: 11/07/19  9:46 PM   Specimen: Nasopharyngeal Swab  Result Value Ref Range Status   SARS Coronavirus 2 by RT PCR POSITIVE (A) NEGATIVE Final    Comment: RESULT CALLED TO, READ BACK BY AND VERIFIED WITH: Cristie Hem, RN AT [id number] ON 11/08/2019 BY SAINVILUS S (NOTE) SARS-CoV-2 target nucleic acids are DETECTED. SARS-CoV-2 RNA is generally detectable in upper respiratory specimens  during the  acute phase of infection. Positive results are indicative of the presence of the identified virus, but do not rule out bacterial infection or co-infection with other pathogens not detected by the test. Clinical correlation with patient history and other diagnostic information is necessary to determine patient infection status. The expected result is Negative. Fact Sheet for Patients:  PinkCheek.be Fact Sheet for Healthcare Providers: GravelBags.it This test is not yet approved or cleared by the [place] FDA and  has been authorized for detection and/or diagnosis of SARS-CoV-2 by FDA under an Emergency Use Authorization (EUA).  This EUA will remain in effect (meaning this test c an be used) for the duration of  the COVID-19 declaration under Section 564(b)(1) of the Act, 21 U.S.C. section 360bbb-3(b)(1), unless the authorization is terminated or revoked sooner.    Influenza A by PCR NEGATIVE NEGATIVE Final   Influenza B by PCR NEGATIVE NEGATIVE Final    Comment: (NOTE) The Xpert Xpress SARS-CoV-2/FLU/RSV assay is intended as an aid in  the diagnosis of influenza from Nasopharyngeal swab specimens and  should not be used as a sole basis for treatment. Nasal washings and  aspirates are unacceptable for Xpert Xpress SARS-CoV-2/FLU/RSV  testing. Fact Sheet for Patients: PinkCheek.be Fact Sheet for Healthcare Providers: GravelBags.it This test is not yet approved or cleared by the [place] FDA and  has been authorized for detection and/or diagnosis of SARS-CoV-2 by  FDA under an Emergency Use Authorization (EUA). This EUA will remain  in effect (meaning this test can be used) for the duration of the  Covid-19 declaration under Section 564(b)(1) of the Act, 21  U.S.C. section 360bbb-3(b)(1), unless the authorization is  terminated or revoked. Performed at [facility], [place] [address], [place], [place] [address]          Radiology Studies: DG CHEST PORT 1 VIEW  Result Date: 11/08/2019 CLINICAL DATA:  Central line placement EXAM: PORTABLE CHEST 1 VIEW COMPARISON:  11/07/2019 FINDINGS: Right central line tip at the cavoatrial junction. No pneumothorax. Cardiomegaly. Bibasilar atelectasis. No effusions or edema. IMPRESSION: Right central line placement with the tip at the cavoatrial junction. No pneumothorax. Cardiomegaly with vascular congestion and bibasilar atelectasis. Electronically Signed   By: Rolm Baptise M.D.   On: 11/08/2019 18:26   DG Chest Port 1 View  Result Date: 11/07/2019 CLINICAL DATA:  Fever EXAM: PORTABLE CHEST 1 VIEW COMPARISON:  05/30/2019 FINDINGS: Heart is normal size. Prior median sternotomy. No confluent opacities or effusions. No acute bony abnormality. IMPRESSION: No active disease. Electronically Signed   By: Rolm Baptise M.D.   On: 11/07/2019 22:15   DG Tibia/Fibula Left Port  Result Date: 11/07/2019 CLINICAL DATA:  Fever.  Wound infection EXAM: PORTABLE LEFT TIBIA AND FIBULA - 2 VIEW COMPARISON:  None. FINDINGS: Soft tissue defect/wound within the lateral soft tissues. Vascular calcifications. No acute bony abnormality. No bone destruction to suggest osteomyelitis. IMPRESSION: Lateral soft tissue wound.  No bony abnormality. Electronically Signed   By: Rolm Baptise M.D.   On: 11/07/2019 22:16   CT Renal Stone Study  Result Date: 11/08/2019 CLINICAL DATA:  Flank pain, diarrhea EXAM: CT ABDOMEN AND PELVIS WITHOUT CONTRAST TECHNIQUE: Multidetector CT imaging of the abdomen and pelvis was performed following the standard protocol without IV contrast. COMPARISON:  MRI abdomen Feb 19, 2015 FINDINGS: Lower chest: The visualized heart size within normal limits. No pericardial fluid/thickening. There is a small hiatal hernia. Minimal bibasilar dependent atelectasis is noted. Hepatobiliary: Although limited due to the lack of  intravenous contrast, normal in appearance without gross focal abnormality. No evidence of calcified gallstones or biliary ductal dilatation. Pancreas:  Unremarkable.  No surrounding inflammatory changes. Spleen: Normal in size. Although limited due to the lack of intravenous contrast, normal in appearance. Adrenals/Urinary Tract: Both adrenal glands appear normal. There is atrophy of the bilateral native kidneys. There is a low-density lesion seen off the upper pole of the left kidney measuring 1.3 cm. No native kidney hydronephrosis is seen. There is a left lower pelvic renal transplant. There is mild pelviectasis as on a prior MRI. Scattered small tiny calcifications are seen. The largest measuring 2 mm in the lower pole. No ureteral dilatation is seen. There is mild bladder wall thickening seen diffusely. Minimal fat stranding changes seen around the superior bladder. Stomach/Bowel: The stomach, small bowel, and colon are normal in appearance. No inflammatory changes or obstructive findings. Scattered colonic diverticula are noted without diverticulitis. Vascular/Lymphatic: There are no enlarged abdominal or pelvic lymph nodes. Scattered dense aortic atherosclerotic calcifications are seen without aneurysmal dilatation. Reproductive: The prostate is unremarkable. Other: There is a small rounded calcified nodule seen in the anterior left lower pelvis, likely area of fat necrosis. Musculoskeletal: No acute or significant osseous findings. Degenerative changes are seen throughout the thoracolumbar spine. IMPRESSION: Unchanged mild pelviectasis of the left renal transplant with nonobstructing tiny renal calcifications. Findings which could be suggestive of cystitis. Diverticulosis without diverticulitis. Aortic Atherosclerosis (ICD10-I70.0). Electronically Signed   By: Prudencio Pair M.D.   On: 11/08/2019 00:47   [place] EKG SITE RITE  Result Date: 11/08/2019 If Site Rite image not attached, placement could not be  confirmed due to current cardiac rhythm.       Scheduled Meds: . atorvastatin  80 mg Oral q1800  . Chlorhexidine Gluconate Cloth  6 each Topical Daily  . gabapentin  200 mg Oral TID  . insulin aspart  0-9 Units Subcutaneous Q4H  . metoprolol tartrate  25 mg Oral BID  . predniSONE  40 mg Oral Q breakfast  . sodium bicarbonate  1,300 mg Oral BID  . tacrolimus  2 mg Oral QPM  . tacrolimus  3 mg Oral Daily  . tamsulosin  0.4 mg Oral QHS   Continuous Infusions: . sodium chloride    . heparin 1,250 Units/hr (11/09/19 0518)  . remdesivir 100 mg in NS 100 mL       LOS: 1 day   Time spent= 35 mins    Inger Wiest Arsenio Loader, MD [facility]  If 7PM-7AM, please contact night-coverage  11/09/2019, 7:50 AM

## 2019-11-10 LAB — COMPREHENSIVE METABOLIC PANEL
ALT: 13 U/L (ref 0–44)
AST: 18 U/L (ref 15–41)
Albumin: 2.7 g/dL — ABNORMAL LOW (ref 3.5–5.0)
Alkaline Phosphatase: 34 U/L — ABNORMAL LOW (ref 38–126)
Anion gap: 7 (ref 5–15)
BUN: 42 mg/dL — ABNORMAL HIGH (ref 6–20)
CO2: 19 mmol/L — ABNORMAL LOW (ref 22–32)
Calcium: 8.9 mg/dL (ref 8.9–10.3)
Chloride: 115 mmol/L — ABNORMAL HIGH (ref 98–111)
Creatinine, Ser: 1.36 mg/dL — ABNORMAL HIGH (ref 0.61–1.24)
GFR calc Af Amer: >60 mL/min (ref >60)
GFR calc non Af Amer: 57 mL/min — ABNORMAL LOW (ref >60)
Glucose, Bld: 142 mg/dL — ABNORMAL HIGH (ref 70–99)
Potassium: 4.2 mmol/L (ref 3.5–5.1)
Sodium: 141 mmol/L (ref 135–145)
Total Bilirubin: 0.6 mg/dL (ref 0.3–1.2)
Total Protein: 6.6 g/dL (ref 6.5–8.1)

## 2019-11-10 LAB — CBC WITH DIFFERENTIAL/PLATELET
Abs Immature Granulocytes: 0.08 10*3/uL — ABNORMAL HIGH (ref 0.00–0.07)
Basophils Absolute: 0 10*3/uL (ref 0.0–0.1)
Basophils Relative: 0 %
Eosinophils Absolute: 0 10*3/uL (ref 0.0–0.5)
Eosinophils Relative: 0 %
HCT: 39.8 % (ref 39.0–52.0)
Hemoglobin: 12.6 g/dL — ABNORMAL LOW (ref 13.0–17.0)
Immature Granulocytes: 2 %
Lymphocytes Relative: 17 %
Lymphs Abs: 0.6 10*3/uL — ABNORMAL LOW (ref 0.7–4.0)
MCH: 24.5 pg — ABNORMAL LOW (ref 26.0–34.0)
MCHC: 31.7 g/dL (ref 30.0–36.0)
MCV: 77.3 fL — ABNORMAL LOW (ref 80.0–100.0)
Monocytes Absolute: 0.3 10*3/uL (ref 0.1–1.0)
Monocytes Relative: 10 %
Neutro Abs: 2.5 10*3/uL (ref 1.7–7.7)
Neutrophils Relative %: 71 %
Platelets: 117 10*3/uL — ABNORMAL LOW (ref 150–400)
RBC: 5.15 MIL/uL (ref 4.22–5.81)
RDW: 18.7 % — ABNORMAL HIGH (ref 11.5–15.5)
WBC: 3.5 10*3/uL — ABNORMAL LOW (ref 4.0–10.5)
nRBC: 0 % (ref 0.0–0.2)

## 2019-11-10 LAB — GLUCOSE, CAPILLARY
Glucose-Capillary: 126 mg/dL — ABNORMAL HIGH (ref 70–99)
Glucose-Capillary: 128 mg/dL — ABNORMAL HIGH (ref 70–99)
Glucose-Capillary: 148 mg/dL — ABNORMAL HIGH (ref 70–99)
Glucose-Capillary: 113 mg/dL — ABNORMAL HIGH (ref 70–99)
Glucose-Capillary: 248 mg/dL — ABNORMAL HIGH (ref 70–99)

## 2019-11-10 LAB — URINE CULTURE: Culture: <10000 — AB

## 2019-11-10 LAB — CULTURE, BLOOD (ROUTINE X 2): Special Requests: ADEQUATE

## 2019-11-10 LAB — FERRITIN: Ferritin: 724 ng/mL — ABNORMAL HIGH (ref 24–336)

## 2019-11-10 LAB — HEPARIN LEVEL (UNFRACTIONATED): Heparin Unfractionated: 0.32 IU/mL (ref 0.30–0.70)

## 2019-11-10 LAB — D-DIMER, QUANTITATIVE: D-Dimer, Quant: 1.3 ug/mL-FEU — ABNORMAL HIGH (ref 0.00–0.50)

## 2019-11-10 LAB — APTT: aPTT: 90 seconds — ABNORMAL HIGH (ref 24–36)

## 2019-11-10 LAB — LACTATE DEHYDROGENASE: LDH: 221 U/L — ABNORMAL HIGH (ref 98–192)

## 2019-11-10 LAB — C-REACTIVE PROTEIN: CRP: 7.9 mg/dL — ABNORMAL HIGH (ref <1.0)

## 2019-11-10 MED ORDER — RIVAROXABAN 2.5 MG PO TABS
2.5000 mg | ORAL_TABLET | Freq: Two times a day (BID) | ORAL | Status: DC
  Administered 2019-11-10 – 2019-11-11 (×3): 2.5 mg via ORAL
  Filled 2019-11-10 (×5): qty 1

## 2019-11-10 MED ORDER — ASPIRIN EC 81 MG PO TBEC
81.0000 mg | DELAYED_RELEASE_TABLET | Freq: Every day | ORAL | Status: DC
  Administered 2019-11-10 – 2019-11-11 (×2): 81 mg via ORAL
  Filled 2019-11-10 (×2): qty 1

## 2019-11-10 NOTE — Progress Notes (Signed)
Pt. HR decreased to low 30s and had a 2.75sec pause. HR is now 65, Sinus rhythm and pt. is alert,  responsive and asymptomatic.  MD page

## 2019-11-10 NOTE — Progress Notes (Signed)
ANTICOAGULATION CONSULT NOTE - Follow-Up Consult  Pharmacy Consult for Heparin Indication: DVT (Dec 2020)- while Xarelto on hold  Patient Measurements: Height: 5\' 8"  (172.7 cm) Weight: 182 lb 1.6 oz (82.6 kg) IBW/kg (Calculated) : 68.4  Wt: 83 kg  Vital Signs: Temp: 97.7 F (36.5 C) (02/09 0400) Temp Source: Axillary (02/09 0400) BP: 118/78 (02/09 0400) Pulse Rate: 63 (02/09 0400)  Labs: Recent Labs    11/08/19 1030 11/08/19 1030 11/09/19 0530 11/09/19 1835 11/10/19 0520  HGB 13.4   < > 11.6*  --  12.6*  HCT 41.3  --  36.7*  --  39.8  PLT 88*  --  92*  --  117*  APTT 179*   < > >200* 176* 90*  HEPARINUNFRC 0.69   < > 0.71* 0.37 0.32  CREATININE 2.46*  --  1.81*  --  1.36*   < > = values in this interval not displayed.    Estimated Creatinine Clearance: 61.3 mL/min (A) (by C-G formula based on SCr of 1.36 mg/dL (H)).  Assessment: 59 y.o. M on Xarelto PTA for recent DVT (Dec 2020) - last dose 2/4 pm. Pt now with AKI on admission  (SCr up to 3.6) so holding Xarelto and will use heparin for now.  HL and aPTT therapeutic. HL has plateaued at 0.3 and is lower than expected compared to aPTT. If rivaroxaban still in system would expect increased HL. Current HL should be reflecting heparin anticoaulation given 4.5 days from last rivaroxaban dose and renal function improved to near baseline. Will follow HL from now and increase infusion to keep pt in goal.     Goal of Therapy:  Heparin level 0.3-0.7 units/ml Monitor platelets by anticoagulation protocol: Yes   Plan:  Increase heparin rate to 900 units / hr Check 8 hr HL Monitor daily HL, CBC, plt Monitor for signs/symptoms of bleeding   Benetta Spar, PharmD, BCPS, BCCP Clinical Pharmacist  Please check AMION for all [place] phone numbers After 10:00 PM, call [place]

## 2019-11-10 NOTE — Progress Notes (Addendum)
PROGRESS NOTE    KARANDEEP RESENDE  XVQ:[id number] DOB: 11-24-1960 DOA: 11/07/2019 PCP: [name], MD   Brief Narrative:  59 year old with a history of peripheral arterial disease with chronic left lower extremity ulceration with wound VAC in place, CAD status post CABG, DVT on anticoagulation, essential hypertension, DM 2, renal transplant follows at [facility] has been having GI symptoms from COVID-19 for the past several days with poor oral intake.  Admitted to the hospital for acute kidney injury transferred to [facility].  Upon admission he was started on Decadron and remdesivir which he tolerated well.  For acute kidney injury he received IV fluids with significantly improved his creatinine.  Central line was placed due to lack of IV access, Xarelto discontinued and placed on heparin drip.  Oral intake is slowly improving.  Prograf levels managed by pharmacy team.  Staph in blood culture, likely contaminant.   Assessment & Plan:   Principal Problem:   COVID-19 virus infection Active Problems:   Renal transplant recipient   Essential hypertension   Type 2 diabetes mellitus with vascular disease ([place])   AKI (acute kidney injury) ([place])   History of DVT (deep vein thrombosis)  Acute kidney injury with history of renal transplant Moderate dehydration secondary to diarrhea and poor oral intake, improving Diarrhea secondary to COVID-19 infection, improved -Oxygen levels-patient is now on room air. -Remdesivir-day 3 -prednisone 40 mg daily -day 3 -Actemra-none -Convalescent plasma-none -Antibiotics-none -procalcitonin-0.2 -Chest x-ray-central venous congestion -Supportive care-antitussive, inhalers, I-S/flutter -CODE STATUS confirmed -Vitamin C & Zinc. Prone >16hrs/day.  -Routine: Labs have been reviewed including ferritin, LDH, CRP, d-dimer, fibrinogen.  Will need to trend this lab daily. -Central line placed 2/7 due to lack of IV access  History of renal  transplant -Follows a [facility].  Case discussed with on-call nephrologist on the day of admission. -Prednisone, Prograf.  We will check 12-hour trough for Prograf today, if therapeutic range we will continue current dosing otherwise further adjust as needed.  Long-term dose adjustments can be made by his primary nephrologist at [facility].  Peripheral arterial disease with chronic left lower extremity wound -Routine wound care per nursing staff.  Wound VAC in place.  History of coronary artery disease status post CABG Asymptomatic bradycardia -On aspirin and statin -Holding Ranexa and metoprolol  Left lower extremity DVT -Transition from IV heparin to Xarelto.  Diabetes mellitus type 2 -Blood glucose in acceptable range  Hyperlipidemia -Daily statin  Essential hypertension -Metoprolol  History of BPH -Flomax  Gram-positive bacteremia-likely contaminated.  Only 1 set of cultures positive.  We will continue to monitor him.   DVT prophylaxis: Heparin drip Code Status: Full code Family Communication:   Disposition Plan:   Patient From= home  Patient Anticipated D/C place= Home  Barriers= awaiting his nausea and oral intake to improve.  Once this has been adequately achieved, will transition him home.  In the meantime continue IV remdesivir.  Due to lack of support at home he is unable to come to our infusion center.  Consultants:   Curbside-nephrology  Procedures:   Central line placed 2/7  Antimicrobials:   Remdesivir   Subjective: Has some nausea but overall feels slightly better.  Does not yet feel back to his baseline.  Afraid of going home without ensuring his oral intake has remained consistently adequate given his history of renal transplant and at risk of acute kidney injury again.  Review of Systems Otherwise negative except as per HPI, including: General =  no fevers, chills, dizziness,  fatigue HEENT/EYES = negative for loss of vision, double vision,  blurred vision,  sore throa Cardiovascular= negative for chest pain, palpitation Respiratory/lungs= negative for shortness of breath, cough, wheezing; hemoptysis,  Gastrointestinal= negative for nausea, vomiting, abdominal pain Genitourinary= negative for Dysuria MSK = Negative for arthralgia, myalgias Neurology= Negative for headache, numbness, tingling  Psychiatry= Negative for suicidal and homocidal ideation Skin= Negative for Rash   Examination:  Constitutional: Not in acute distress Respiratory: Bibasilar rhonchi-minimal Cardiovascular: Normal sinus rhythm, no rubs Abdomen: Nontender nondistended good bowel sounds Musculoskeletal: No edema noted Skin: No rashes seen Neurologic: CN 2-12 grossly intact.  And nonfocal Psychiatric: Normal judgment and insight. Alert and oriented x 3. Normal mood.    Right IJ central line placed 2/7.  Objective: Vitals:   11/09/19 1937 11/10/19 0035 11/10/19 0400 11/10/19 0800  BP: (!) 145/84 132/68 118/78 126/88  Pulse: (!) 52 (!) 47 63 (!) 52  Resp: 16 16 16 16   Temp: (!) 97.2 F (36.2 C) 97.6 F (36.4 C) 97.7 F (36.5 C) 98 F (36.7 C)  TempSrc: Oral Axillary Axillary Axillary  SpO2: 100% 100% 95% 98%  Weight:      Height:        Intake/Output Summary (Last 24 hours) at 11/10/2019 1039 Last data filed at 11/10/2019 0615 Gross per 24 hour  Intake 2365 ml  Output 1100 ml  Net 1265 ml   Filed Weights   11/08/19 1400  Weight: 82.6 kg     Data Reviewed:   CBC: Recent Labs  Lab 11/07/19 2119 11/08/19 1030 11/09/19 0530 11/10/19 0520  WBC 3.7* 3.3* 2.0* 3.5*  NEUTROABS 2.4 2.5 1.3* 2.5  HGB 13.6 13.4 11.6* 12.6*  HCT 43.8 41.3 36.7* 39.8  MCV 78.9* 76.2* 77.3* 77.3*  PLT 101* 88* 92* 101*   Basic Metabolic Panel: Recent Labs  Lab 11/07/19 2119 11/08/19 1030 11/09/19 0530 11/10/19 0520  NA 139 137 138 141  K 4.3 4.1 4.3 4.2  CL 106 108 109 115*  CO2 21* 16* 19* 19*  GLUCOSE 116* 87 123* 142*  BUN 41* 41* 46*  42*  CREATININE 3.60* 2.46* 1.81* 1.36*  CALCIUM 9.2 8.8* 8.8* 8.9   GFR: Estimated Creatinine Clearance: 61.3 mL/min (A) (by C-G formula based on SCr of 1.36 mg/dL (H)). Liver Function Tests: Recent Labs  Lab 11/07/19 2119 11/09/19 0530 11/10/19 0520  AST 24 21 18   ALT 16 14 13   ALKPHOS 42 34* 34*  BILITOT 0.7 0.6 0.6  PROT 7.5 6.8 6.6  ALBUMIN 3.1* 2.9* 2.7*   Recent Labs  Lab 11/07/19 2119  LIPASE 44   No results for input(s): AMMONIA in the last 168 hours. Coagulation Profile: No results for input(s): INR, PROTIME in the last 168 hours. Cardiac Enzymes: No results for input(s): CKTOTAL, CKMB, CKMBINDEX, TROPONINI in the last 168 hours. BNP (last 3 results) No results for input(s): PROBNP in the last 8760 hours. HbA1C: Recent Labs    11/08/19 1030  HGBA1C 6.5*   CBG: Recent Labs  Lab 11/09/19 1229 11/09/19 1703 11/09/19 1939 11/10/19 0033 11/10/19 0404  GLUCAP 148* 169* 202* 126* 128*   Lipid Profile: No results for input(s): CHOL, HDL, LDLCALC, TRIG, CHOLHDL, LDLDIRECT in the last 72 hours. Thyroid Function Tests: No results for input(s): TSH, T4TOTAL, FREET4, T3FREE, THYROIDAB in the last 72 hours. Anemia Panel: Recent Labs    11/09/19 0732 11/10/19 0520  FERRITIN  --  724*  TIBC 155*  --  IRON 30*  --    Sepsis Labs: Recent Labs  Lab 11/07/19 2120 11/08/19 1030  PROCALCITON  --  0.28  LATICACIDVEN 1.7  --     Recent Results (from the past 240 hour(s))  Blood culture (routine x 2)     Status: None (Preliminary result)   Collection Time: 11/07/19  9:20 PM   Specimen: BLOOD  Result Value Ref Range Status   Specimen Description BLOOD SITE NOT SPECIFIED  Final   Special Requests   Final    BOTTLES DRAWN AEROBIC AND ANAEROBIC Blood Culture adequate volume   Culture   Final    NO GROWTH 3 DAYS Performed at [facility], [address], [place], [place] [address]    Report Status PENDING  Incomplete  Blood culture (routine x 2)      Status: Abnormal   Collection Time: 11/07/19  9:40 PM   Specimen: BLOOD LEFT FOREARM  Result Value Ref Range Status   Specimen Description BLOOD LEFT FOREARM  Final   Special Requests   Final    BOTTLES DRAWN AEROBIC AND ANAEROBIC Blood Culture adequate volume   Culture  Setup Time   Final    GRAM POSITIVE COCCI IN BOTH AEROBIC AND ANAEROBIC BOTTLES CRITICAL RESULT CALLED TO, READ BACK BY AND VERIFIED WITH: PHARMD M. PHAM [phone] FCP    Culture (A)  Final    STAPHYLOCOCCUS SPECIES (COAGULASE NEGATIVE) THE SIGNIFICANCE OF ISOLATING THIS ORGANISM FROM A SINGLE SET OF BLOOD CULTURES WHEN MULTIPLE SETS ARE DRAWN IS UNCERTAIN. PLEASE NOTIFY THE MICROBIOLOGY DEPARTMENT WITHIN ONE WEEK IF SPECIATION AND SENSITIVITIES ARE REQUIRED. Performed at [facility], [place] [address], [place], [place] [address]    Report Status 11/10/2019 FINAL  Final  Respiratory Panel by RT PCR (Flu A&B, Covid) - Nasopharyngeal Swab     Status: Abnormal   Collection Time: 11/07/19  9:46 PM   Specimen: Nasopharyngeal Swab  Result Value Ref Range Status   SARS Coronavirus 2 by RT PCR POSITIVE (A) NEGATIVE Final    Comment: RESULT CALLED TO, READ BACK BY AND VERIFIED WITH: Cristie Hem, RN AT [phone] ON 11/08/2019 BY SAINVILUS S (NOTE) SARS-CoV-2 target nucleic acids are DETECTED. SARS-CoV-2 RNA is generally detectable in upper respiratory specimens  during the acute phase of infection. Positive results are indicative of the presence of the identified virus, but do not rule out bacterial infection or co-infection with other pathogens not detected by the test. Clinical correlation with patient history and other diagnostic information is necessary to determine patient infection status. The expected result is Negative. Fact Sheet for Patients:  PinkCheek.be Fact Sheet for Healthcare Providers: GravelBags.it This test is not yet approved or cleared by the  [place] FDA and  has been authorized for detection and/or diagnosis of SARS-CoV-2 by FDA under an Emergency Use Authorization (EUA).  This EUA will remain in effect (meaning this test c an be used) for the duration of  the COVID-19 declaration under Section 564(b)(1) of the Act, 21 U.S.C. section 360bbb-3(b)(1), unless the authorization is terminated or revoked sooner.    Influenza A by PCR NEGATIVE NEGATIVE Final   Influenza B by PCR NEGATIVE NEGATIVE Final    Comment: (NOTE) The Xpert Xpress SARS-CoV-2/FLU/RSV assay is intended as an aid in  the diagnosis of influenza from Nasopharyngeal swab specimens and  should not be used as a sole basis for treatment. Nasal washings and  aspirates are unacceptable for Xpert Xpress SARS-CoV-2/FLU/RSV  testing.  Fact Sheet for Patients: PinkCheek.be Fact Sheet for Healthcare Providers: GravelBags.it This test is not yet approved or cleared by the [place] FDA and  has been authorized for detection and/or diagnosis of SARS-CoV-2 by  FDA under an Emergency Use Authorization (EUA). This EUA will remain  in effect (meaning this test can be used) for the duration of the  Covid-19 declaration under Section 564(b)(1) of the Act, 21  U.S.C. section 360bbb-3(b)(1), unless the authorization is  terminated or revoked. Performed at [facility], [place] [address], [place], [place] [address]   Urine Culture     Status: Abnormal   Collection Time: 11/09/19  5:30 AM   Specimen: Urine, Clean Catch  Result Value Ref Range Status   Specimen Description   Final    URINE, CLEAN CATCH Performed at [facility], [place] [address] [place], [place] [address]    Special Requests   Final    NONE Performed at [facility], [place] [address] [place], [place] [address]    Culture (A)  Final    <10,000 COLONIES/mL INSIGNIFICANT GROWTH Performed at [place] [address], [place], [place] [address]    Report Status 11/10/2019 FINAL  Final         Radiology Studies: DG CHEST PORT 1 VIEW  Result Date: 11/08/2019 CLINICAL DATA:  Central line placement EXAM: PORTABLE CHEST 1 VIEW COMPARISON:  11/07/2019 FINDINGS: Right central line tip at the cavoatrial junction. No pneumothorax. Cardiomegaly. Bibasilar atelectasis. No effusions or edema. IMPRESSION: Right central line placement with the tip at the cavoatrial junction. No pneumothorax. Cardiomegaly with vascular congestion and bibasilar atelectasis. Electronically Signed   By: Rolm Baptise M.D.   On: 11/08/2019 18:26        Scheduled Meds: . aspirin EC  81 mg Oral Daily  . atorvastatin  80 mg Oral q1800  . Chlorhexidine Gluconate Cloth  6 each Topical Daily  . gabapentin  200 mg Oral TID  . insulin aspart  0-9 Units Subcutaneous Q4H  . predniSONE  40 mg Oral Q breakfast  . sodium bicarbonate  1,300 mg Oral BID  . tacrolimus  2 mg Oral QPM  . tacrolimus  3 mg Oral Daily  . tamsulosin  0.4 mg Oral QHS   Continuous Infusions: . sodium chloride    . sodium chloride 75 mL/hr at 11/09/19 2301  . heparin 900 Units/hr (11/10/19 0842)  . remdesivir 100 mg in NS 100 mL 100 mg (11/10/19 0940)     LOS: 2 days   Time spent= 25 mins    Shawnie Nicole Arsenio Loader, MD [facility]  If 7PM-7AM, please contact night-coverage  11/10/2019, 10:39 AM

## 2019-11-10 NOTE — Progress Notes (Signed)
      11/10/19 1534  Wound / Incision (Open or Dehisced) 11/08/19 Non-pressure wound Leg Left;Lower;Circumferential  Date First Assessed/Time First Assessed: 11/08/19 1900   Wound Type: Non-pressure wound  Location: Leg  Location Orientation: Left;Lower;Circumferential  Wound Image Images linked    Wound dressing change. 4.5 cm x 2.5 cm. 1 black foam used.

## 2019-11-10 NOTE — Consult Note (Signed)
WOC noted that NPWT dressing was not change as requested 11/09/19, confusion with order placed.  Order updated, discussed with bedside nurse. To be changed today and order updated to T/Th/Sat change.  3 dressing kits/canister and machine on the department.    Thanks  Tamaj Jurgens [employer], RN,CWOCN, CNS, [place] [phone])

## 2019-11-10 NOTE — Progress Notes (Signed)
Physical Therapy Treatment Patient Details Name: Theodore Arellano MRN: [id number] DOB: 1960/11/08 Today's Date: 11/10/2019    History of Present Illness Pt is a 59 y/o M, who presents to ED on 11/07/19 secondary to diarrhea and fever. He was diagnosed at an outside facility with COVID-19 (per md record date unknown), and transfered to [facility] for further management on 11/08/19. His past medical history is significant for Renal transplant, DM2, HKI, History of DVT, current smoker.    PT Comments    Pt did well with tx today, demonstrated increased activity tolerance and also independence with functional mobility. He c/o pain in chest which he states is indigestion from drinking too much soda. He was able to ambulate approx 53ft in room with SBA, pt was on room air throughout and managed to maintain sats in low 90s. Completed breathing exercises with vcs and also sit<>stand exercises with vcs.      Follow Up Recommendations  Home health PT     Equipment Recommendations  3in1 (PT)    Recommendations for Other Services Other (comment)     Precautions / Restrictions Precautions Precautions: Other (comment);Fall Precaution Comments: Airborne Restrictions Weight Bearing Restrictions: No    Mobility  Bed Mobility Overal bed mobility: Modified Independent                Transfers Overall transfer level: Modified independent Equipment used: None             General transfer comment: able to get from bed to recliner and also off low commode  Ambulation/Gait Ambulation/Gait assistance: Modified independent (Device/Increase time);Supervision Gait Distance (Feet): 60 Feet Assistive device: None Gait Pattern/deviations: Step-through pattern Gait velocity: fair       Stairs             Wheelchair Mobility    Modified Rankin (Stroke Patients Only)       Balance Overall balance assessment: Mild deficits observed, not formally tested   Sitting balance-Leahy Scale:  Good       Standing balance-Leahy Scale: Fair                              Cognition Arousal/Alertness: Awake/alert Behavior During Therapy: WFL for tasks assessed/performed Overall Cognitive Status: No family/caregiver present to determine baseline cognitive functioning                                        Exercises Other Exercises Other Exercises: Spirometry maxed x5 Other Exercises: sit<>stand at edge of bed x 5    General Comments        Pertinent Vitals/Pain Pain Assessment: Faces Faces Pain Scale: Hurts little more Pain Location: c/o what he thinks is heart burn from drinking too much soda Pain Descriptors / Indicators: Cramping;Burning    Home Living                      Prior Function            PT Goals (current goals can now be found in the care plan section) Acute Rehab PT Goals Patient Stated Goal: Return home PT Goal Formulation: With patient Time For Goal Achievement: 11/23/19 Potential to Achieve Goals: Good    Frequency    Min 3X/week      PT Plan Discharge plan needs to be updated  Co-evaluation              AM-PAC PT "6 Clicks" Mobility   Outcome Measure  Help needed turning from your back to your side while in a flat bed without using bedrails?: None Help needed moving from lying on your back to sitting on the side of a flat bed without using bedrails?: None Help needed moving to and from a bed to a chair (including a wheelchair)?: A Little Help needed standing up from a chair using your arms (e.g., wheelchair or bedside chair)?: A Little Help needed to walk in hospital room?: A Little Help needed climbing 3-5 steps with a railing? : A Lot 6 Click Score: 19    End of Session Equipment Utilized During Treatment: Gait belt Activity Tolerance: Patient limited by fatigue;Treatment limited secondary to medical complications (Comment) Patient left: with call bell/phone within reach;in bed    PT Visit Diagnosis: Unsteadiness on feet (R26.81);Other abnormalities of gait and mobility (R26.89);Muscle weakness (generalized) (M62.81);Difficulty in walking, not elsewhere classified (R26.2)     Time: 0221-7981 PT Time Calculation (min) (ACUTE ONLY): 29 min  Charges:  $Gait Training: 8-22 mins $Therapeutic Exercise: 8-22 mins                     Theodore Arellano, PT    Theodore Arellano 11/10/2019, 2:36 PM

## 2019-11-10 NOTE — Plan of Care (Addendum)
Patient remained on room air throughout the shift. Aox4. No complaints of pain.   Wound vac changed to inpatient/hospital provided wound vac. Wound is 4.5 cm x 2.5 cm. 1 piece of black foam used for the wound change. No leak in the wound dressing when switched appliances. Pt requested that he updates his own loved ones and family members since he has his phone.   Patient's wound vac from home is in his patient belongings bag that is in the room.   Problem: Education: Goal: Knowledge of risk factors and measures for prevention of condition will improve Outcome: Progressing   Problem: Respiratory: Goal: Will maintain a patent airway Outcome: Progressing Goal: Complications related to the disease process, condition or treatment will be avoided or minimized Outcome: Progressing

## 2019-11-11 ENCOUNTER — Ambulatory Visit (HOSPITAL_COMMUNITY): Payer: Medicare Other

## 2019-11-11 LAB — GLUCOSE, CAPILLARY
Glucose-Capillary: 147 mg/dL — ABNORMAL HIGH (ref 70–99)
Glucose-Capillary: 254 mg/dL — ABNORMAL HIGH (ref 70–99)
Glucose-Capillary: 126 mg/dL — ABNORMAL HIGH (ref 70–99)
Glucose-Capillary: 100 mg/dL — ABNORMAL HIGH (ref 70–99)
Glucose-Capillary: 164 mg/dL — ABNORMAL HIGH (ref 70–99)

## 2019-11-11 MED ORDER — ALBUTEROL SULFATE HFA 108 (90 BASE) MCG/ACT IN AERS: 2.0000 | INHALATION_SPRAY | Freq: Once | RESPIRATORY_TRACT | Status: DC | PRN

## 2019-11-11 MED ORDER — BLOOD GLUCOSE MONITOR KIT: PACK | 0 refills | Status: DC

## 2019-11-11 MED ORDER — METHYLPREDNISOLONE SODIUM SUCC 125 MG IJ SOLR: 125.0000 mg | Freq: Once | INTRAMUSCULAR | Status: DC | PRN

## 2019-11-11 MED ORDER — SODIUM CHLORIDE 0.9 % IV SOLN: 100.0000 mg | Freq: Once | INTRAVENOUS | Status: DC

## 2019-11-11 MED ORDER — ATORVASTATIN CALCIUM 40 MG PO TABS: 80.0000 mg | ORAL_TABLET | Freq: Every day | ORAL | Status: DC

## 2019-11-11 MED ORDER — FAMOTIDINE IN NACL 20-0.9 MG/50ML-% IV SOLN: 20.0000 mg | Freq: Once | INTRAVENOUS | Status: DC | PRN

## 2019-11-11 MED ORDER — SODIUM CHLORIDE 0.9 % IV SOLN: INTRAVENOUS | Status: DC | PRN

## 2019-11-11 MED ORDER — ISOSORBIDE MONONITRATE ER 30 MG PO TB24: 60.0000 mg | ORAL_TABLET | Freq: Every day | ORAL | 0 refills | Status: DC

## 2019-11-11 MED ORDER — EPINEPHRINE 0.3 MG/0.3ML IJ SOAJ: 0.3000 mg | Freq: Once | INTRAMUSCULAR | Status: DC | PRN

## 2019-11-11 MED ORDER — PREDNISONE 20 MG PO TABS: ORAL_TABLET | ORAL | 0 refills | Status: AC

## 2019-11-11 MED ORDER — DIPHENHYDRAMINE HCL 50 MG/ML IJ SOLN: 50.0000 mg | Freq: Once | INTRAMUSCULAR | Status: DC | PRN

## 2019-11-11 NOTE — Discharge Instructions (Addendum)
You are scheduled for an outpatient infusion of Remdesivir at 10:00 AM on Thursday 2/11.  Please report to [facility] at [address].  Drive to the security guard and tell them you are here for an infusion. They will direct you to the front entrance where we will come and get you.  For questions call [phone].  Thanks    1. Needs 1 more Rx of Remdisivir 2. RN to show Glucomter use and method 3. Need renal panel 1 week as well as coordination of tacrolimus and Prograf levels as it appears patient was not on days after last admission at [facility] 4. Patient will need tapering doses of steroids as instructed and will require outpatient follow-up with regards to the same 5. Patient may require either SGL PT 1 inhibitor or Metformin on discharge based on kidney function his A1c was 6.5 this admission 6. He will require wound VAC management he has the VAC at home    COVID-19 COVID-19 is a respiratory infection that is caused by a virus called severe acute respiratory syndrome coronavirus 2 (SARS-CoV-2). The disease is also known as coronavirus disease or novel coronavirus. In some people, the virus may not cause any symptoms. In others, it may cause a serious infection. The infection can get worse quickly and can lead to complications, such as:  Pneumonia, or infection of the lungs.  Acute respiratory distress syndrome or ARDS. This is a condition in which fluid build-up in the lungs prevents the lungs from filling with air and passing oxygen into the blood.  Acute respiratory failure. This is a condition in which there is not enough oxygen passing from the lungs to the body or when carbon dioxide is not passing from the lungs out of the body.  Sepsis or septic shock. This is a serious bodily reaction to an infection.  Blood clotting problems.  Secondary infections due to bacteria or fungus.  Organ failure. This is when your body's organs stop working. The virus that  causes COVID-19 is contagious. This means that it can spread from person to person through droplets from coughs and sneezes (respiratory secretions). What are the causes? This illness is caused by a virus. You may catch the virus by:  Breathing in droplets from an infected person. Droplets can be spread by a person breathing, speaking, singing, coughing, or sneezing.  Touching something, like a table or a doorknob, that was exposed to the virus (contaminated) and then touching your mouth, nose, or eyes. What increases the risk? Risk for infection You are more likely to be infected with this virus if you:  Are within 6 feet (2 meters) of a person with COVID-19.  Provide care for or live with a person who is infected with COVID-19.  Spend time in crowded indoor spaces or live in shared housing. Risk for serious illness You are more likely to become seriously ill from the virus if you:  Are 6 years of age or older. The higher your age, the more you are at risk for serious illness.  Live in a nursing home or long-term care facility.  Have cancer.  Have a long-term (chronic) disease such as: ? Chronic lung disease, including chronic obstructive pulmonary disease or asthma. ? A long-term disease that lowers your body's ability to fight infection (immunocompromised). ? Heart disease, including heart failure, a condition in which the arteries that lead to the heart become narrow or blocked (coronary artery disease), a disease which makes the  heart muscle thick, weak, or stiff (cardiomyopathy). ? Diabetes. ? Chronic kidney disease. ? Sickle cell disease, a condition in which red blood cells have an abnormal "sickle" shape. ? Liver disease.  Are obese. What are the signs or symptoms? Symptoms of this condition can range from mild to severe. Symptoms may appear any time from 2 to 14 days after being exposed to the virus. They include:  A fever or chills.  A cough.  Difficulty  breathing.  Headaches, body aches, or muscle aches.  Runny or stuffy (congested) nose.  A sore throat.  New loss of taste or smell. Some people may also have stomach problems, such as nausea, vomiting, or diarrhea. Other people may not have any symptoms of COVID-19. How is this diagnosed? This condition may be diagnosed based on:  Your signs and symptoms, especially if: ? You live in an area with a COVID-19 outbreak. ? You recently traveled to or from an area where the virus is common. ? You provide care for or live with a person who was diagnosed with COVID-19. ? You were exposed to a person who was diagnosed with COVID-19.  A physical exam.  Lab tests, which may include: ? Taking a sample of fluid from the back of your nose and throat (nasopharyngeal fluid), your nose, or your throat using a swab. ? A sample of mucus from your lungs (sputum). ? Blood tests.  Imaging tests, which may include, X-rays, CT scan, or ultrasound. How is this treated? At present, there is no medicine to treat COVID-19. Medicines that treat other diseases are being used on a trial basis to see if they are effective against COVID-19. Your health care provider will talk with you about ways to treat your symptoms. For most people, the infection is mild and can be managed at home with rest, fluids, and over-the-counter medicines. Treatment for a serious infection usually takes places in a hospital intensive care unit (ICU). It may include one or more of the following treatments. These treatments are given until your symptoms improve.  Receiving fluids and medicines through an IV.  Supplemental oxygen. Extra oxygen is given through a tube in the nose, a face mask, or a hood.  Positioning you to lie on your stomach (prone position). This makes it easier for oxygen to get into the lungs.  Continuous positive airway pressure (CPAP) or bi-level positive airway pressure (BPAP) machine. This treatment uses mild  air pressure to keep the airways open. A tube that is connected to a motor delivers oxygen to the body.  Ventilator. This treatment moves air into and out of the lungs by using a tube that is placed in your windpipe.  Tracheostomy. This is a procedure to create a hole in the neck so that a breathing tube can be inserted.  Extracorporeal membrane oxygenation (ECMO). This procedure gives the lungs a chance to recover by taking over the functions of the heart and lungs. It supplies oxygen to the body and removes carbon dioxide. Follow these instructions at home: Lifestyle  If you are sick, stay home except to get medical care. Your health care provider will tell you how long to stay home. Call your health care provider before you go for medical care.  Rest at home as told by your health care provider.  Do not use any products that contain nicotine or tobacco, such as cigarettes, e-cigarettes, and chewing tobacco. If you need help quitting, ask your health care provider.  Return to your normal  activities as told by your health care provider. Ask your health care provider what activities are safe for you. General instructions  Take over-the-counter and prescription medicines only as told by your health care provider.  Drink enough fluid to keep your urine pale yellow.  Keep all follow-up visits as told by your health care provider. This is important. How is this prevented?  There is no vaccine to help prevent COVID-19 infection. However, there are steps you can take to protect yourself and others from this virus. To protect yourself:   Do not travel to areas where COVID-19 is a risk. The areas where COVID-19 is reported change often. To identify high-risk areas and travel restrictions, check the CDC travel website: FatFares.com.br  If you live in, or must travel to, an area where COVID-19 is a risk, take precautions to avoid infection. ? Stay away from people who are  sick. ? Wash your hands often with soap and water for 20 seconds. If soap and water are not available, use an alcohol-based hand sanitizer. ? Avoid touching your mouth, face, eyes, or nose. ? Avoid going out in public, follow guidance from your state and local health authorities. ? If you must go out in public, wear a cloth face covering or face mask. Make sure your mask covers your nose and mouth. ? Avoid crowded indoor spaces. Stay at least 6 feet (2 meters) away from others. ? Disinfect objects and surfaces that are frequently touched every day. This may include:  Counters and tables.  Doorknobs and light switches.  Sinks and faucets.  Electronics, such as phones, remote controls, keyboards, computers, and tablets. To protect others: If you have symptoms of COVID-19, take steps to prevent the virus from spreading to others.  If you think you have a COVID-19 infection, contact your health care provider right away. Tell your health care team that you think you may have a COVID-19 infection.  Stay home. Leave your house only to seek medical care. Do not use public transport.  Do not travel while you are sick.  Wash your hands often with soap and water for 20 seconds. If soap and water are not available, use alcohol-based hand sanitizer.  Stay away from other members of your household. Let healthy household members care for children and pets, if possible. If you have to care for children or pets, wash your hands often and wear a mask. If possible, stay in your own room, separate from others. Use a different bathroom.  Make sure that all people in your household wash their hands well and often.  Cough or sneeze into a tissue or your sleeve or elbow. Do not cough or sneeze into your hand or into the air.  Wear a cloth face covering or face mask. Make sure your mask covers your nose and mouth. Where to find more information  Centers for Disease Control and Prevention:  PurpleGadgets.be  World Health Organization: https://www.castaneda.info/ Contact a health care provider if:  You live in or have traveled to an area where COVID-19 is a risk and you have symptoms of the infection.  You have had contact with someone who has COVID-19 and you have symptoms of the infection. Get help right away if:  You have trouble breathing.  You have pain or pressure in your chest.  You have confusion.  You have bluish lips and fingernails.  You have difficulty waking from sleep.  You have symptoms that get worse. These symptoms may represent a serious problem  that is an emergency. Do not wait to see if the symptoms will go away. Get medical help right away. Call your local emergency services (911 in the [place] Do not drive yourself to the hospital. Let the emergency medical personnel know if you think you have COVID-19. Summary  COVID-19 is a respiratory infection that is caused by a virus. It is also known as coronavirus disease or novel coronavirus. It can cause serious infections, such as pneumonia, acute respiratory distress syndrome, acute respiratory failure, or sepsis.  The virus that causes COVID-19 is contagious. This means that it can spread from person to person through droplets from breathing, speaking, singing, coughing, or sneezing.  You are more likely to develop a serious illness if you are 24 years of age or older, have a weak immune system, live in a nursing home, or have chronic disease.  There is no medicine to treat COVID-19. Your health care provider will talk with you about ways to treat your symptoms.  Take steps to protect yourself and others from infection. Wash your hands often and disinfect objects and surfaces that are frequently touched every day. Stay away from people who are sick and wear a mask if you are sick. This information is not intended to replace advice given to you by your health care  provider. Make sure you discuss any questions you have with your health care provider. Document Revised: 07/17/2019 Document Reviewed: 10/23/2018 Elsevier Patient Education  [place].

## 2019-11-11 NOTE — Progress Notes (Signed)
I have reviewed using a glucometer and how to take blood sugar with pt, AVS notes, and given instructions for remdesivir infusion tomorrow. All questions were answered. Pt going home with PTAR at 4pm.

## 2019-11-11 NOTE — Progress Notes (Signed)
PT Cancellation Note  Patient Details Name: Theodore Arellano MRN: [id number] DOB: [birth year]   Cancelled Treatment:    Reason Eval/Treat Not Completed: Patient declined, no reason specified Pt states ready to go home and does not want to participate today.  Ann Held PT, DPT Acute Rehab [place]: [place] 11/11/2019, 4:38 PM

## 2019-11-11 NOTE — Progress Notes (Signed)
Pt discharged with PTAR with all belongings and wound vac in place.

## 2019-11-11 NOTE — Progress Notes (Signed)
Occupational Therapy Treatment Patient Details Name: Theodore Arellano MRN: [id number] DOB: 1961/07/19 Today's Date: 11/11/2019    History of present illness Pt is a 59 y/o M, who presents to ED on 11/07/19 secondary to diarrhea and fever. He was diagnosed at an outside facility with COVID-19 (per md record date unknown), and transfered to [facility] for further management on 11/08/19. His past medical history is significant for Renal transplant, DM2, HKI, History of DVT, current smoker.   OT comments  Patient is making steady progress toward OT goals. Patient requires verbal cues of encouragement to sit OOB in recliner during the daytime. Patient was able to safely complete bed mobility with Modified (I). Patient required the use of furniture to maintain dynamic standing balance from bed to recliner. Recommendation for patient to utilize rolling walker in home setting for mobility. Patient was able to complete grooming with Modified [place], while sitting in recliner close to sink. Patient instructed on B UE HEP for AROM to improve muscle strength with Moderate resistance theraband. Patient able to provide return demo with Supervision only with stats dropping to 92% on room air. Patient will benefit from continued OT services in acute setting.    Follow Up Recommendations  Home health OT    Equipment Recommendations  3 in 1 bedside commode    Recommendations for Other Services      Precautions / Restrictions Precautions Precautions: Other (comment);Fall Precaution Comments: Airborne Restrictions Weight Bearing Restrictions: No       Mobility Bed Mobility Overal bed mobility: Modified Independent                Transfers Overall transfer level: Modified independent                    Balance                                           ADL either performed or assessed with clinical judgement   ADL   Eating/Feeding: Independent   Grooming: Wash/dry  hands;Wash/dry face;Oral care;Modified independent                               Functional mobility during ADLs: Supervision/safety       Vision       Perception     Praxis      Cognition Arousal/Alertness: Awake/alert Behavior During Therapy: WFL for tasks assessed/performed                                            Exercises Exercises: General Upper Extremity General Exercises - Upper Extremity Shoulder Flexion: AROM Shoulder Extension: AROM Shoulder ABduction: AROM Shoulder ADduction: AROM Elbow Flexion: AROM Elbow Extension: AROM   Shoulder Instructions       General Comments      Pertinent Vitals/ Pain       Pain Assessment: No/denies pain  Home Living                                          Prior Functioning/Environment  Frequency           Progress Toward Goals  OT Goals(current goals can now be found in the care plan section)  Progress towards OT goals: Progressing toward goals  Acute Rehab OT Goals Patient Stated Goal: Return home  Plan      Co-evaluation                 AM-PAC OT "6 Clicks" Daily Activity     Outcome Measure   Help from another person eating meals?: None Help from another person taking care of personal grooming?: None Help from another person toileting, which includes using toliet, bedpan, or urinal?: None Help from another person bathing (including washing, rinsing, drying)?: A Little Help from another person to put on and taking off regular upper body clothing?: None Help from another person to put on and taking off regular lower body clothing?: A Little 6 Click Score: 22    End of Session        Activity Tolerance Patient tolerated treatment well   Patient Left with call bell/phone within reach;in chair   Nurse Communication Mobility status        Time: 5520-8022 OT Time Calculation (min): 36 min  Charges: OT General  Charges $OT Visit: 1 Visit OT Treatments $Self Care/Home Management : 8-22 mins $Therapeutic Exercise: 8-22 mins  Theodore Arellano OTR/L    Theodore Arellano 11/11/2019, 1:56 PM

## 2019-11-11 NOTE — Consult Note (Signed)
   [facility] Inpatient Consult   11/11/2019  Theodore Arellano 1960/12/25 [id number]  Patient screened for Medicare [employer] post hospitalizations to check   Review of patient's medical record reveals patient has a non-[employer] physician noted.  Chart review reveals patient has [employer] as of 10/02/2019.  Primary Care Provider is Dr. [name] is not in the [place]:  Call attempt to patient's hospital room number was not successful to confirm the above. And attempts to reach patient mobile number listed was beeping busy.  Currently, patient not showing to have a [facility] provider.  For questions contact:   Natividad Brood, RN BSN [facility] Liaison  [phone] business mobile phone Toll free office [phone]  Fax number: [phone] [email or web address]

## 2019-11-11 NOTE — Progress Notes (Addendum)
CSW attempted to reach patient on room phone x2 to complete assessment and set up with [facility]. No answer.  CSW attempted to reach RN, no answer.   Please contact CSW to arrange discharge planning needs.   [place], [place]

## 2019-11-11 NOTE — Discharge Summary (Signed)
Physician Discharge Summary  Theodore Arellano:[id number] DOB: 1961-04-20 DOA: 11/07/2019  PCP: [name], MD  Admit date: 11/07/2019 Discharge date: 11/11/2019  Time spent: 35 minutes  Recommendations for Outpatient Follow-up:  1. Needs 1 more Rx of Remdisivir 2. RN to show Glucomter use and method 3. Need renal panel 1 week as well as coordination of tacrolimus and Prograf levels as it appears patient was not on days after last admission at [facility] 4. Patient will need tapering doses of steroids as instructed and will require outpatient follow-up with regards to the same 5. Patient may require either SGL PT 1 inhibitor or Metformin on discharge based on kidney function his A1c was 6.5 this admission 6. He will require wound VAC management he has the VAC at home  Discharge Diagnoses:  Principal Problem:   COVID-19 virus infection Active Problems:   Renal transplant recipient   Essential hypertension   Type 2 diabetes mellitus with vascular disease ([place])   AKI (acute kidney injury) ([place])   History of DVT (deep vein thrombosis)   Discharge Condition: Improved  Diet recommendation: Diabetic heart healthy  Filed Weights   11/08/19 1400  Weight: 82.6 kg    History of present illness:  40 black male, PAD + chronic L LE ED ulcer + VAC (left femoral endarterectomy left femoropopliteal bypass, left peroneal acute DVT 09/18/2019 CAD status post CABG-last cardiac cath 05/2019 no good targets by either cardiology or CVTS, mild bradycardia, renal transplant [facility] DD KT on immunosuppressants tacrolimus (no Prograf since 09/2019), gout Admitted on 11/08/2019 severe diarrhea abdominal cramps fever chills Covid positive, WBC 3.9 creatinine 0.9-->3.9 BUN 41   Hospital Course:  Coronavirus 19 diarrhea Received remdesivir from 2/7-stop date 2/11-I have pended and held the discharge orders and he will be getting remdesivir at the outpatient clinic on 2/11 for his final dose He is able  to coordinate this and lives in [facility] and will drive back Currently on prednisone 40 and tapering doses were given on discharge (usually on immunosuppressants as below) Empiric management and follow-up Acute kidney injury superimposed on ESRD status post DD KT on tacrolimus Kidney injury seems improved-cut back saline from 125 to 75 cc/H and was discontinued Continue Prograf 2 every afternoon, 3 every morning Continue steroids as above Continue sodium bicarb 1.3 g twice daily PAD, femoropopliteal bypass, femoral endarterectomy 09/2019 Acute DVT 09/2019 admission Heparin changed back to Xarelto Outpatient management of risk factors continue atorvastatin 80 HTN Continue metoprolol 25 twice daily, resume aspirin 81 mg on discharge Continue Imdur at lower dose of 30 on discharge-at this time would hold home dosage of lisinopril and resume in the next several weeks in the outpatient setting DM TY 2 with neuropathy nephropathy Dose adjustments made to gabapentin Not on insulin at home?  Continue sliding scale-ranges between 126 and 238 but on steroids do not expect to be well controlled Nursing has instructed him in the use of a glucometer and he will get one on discharge Probable BPH-continue Flomax 0.4 at bedtime    Discharge Exam: Vitals:   11/11/19 0329 11/11/19 0800  BP: (!) 140/93 (!) 150/83  Pulse: (!) 52 (!) 54  Resp: 15 16  Temp: (!) 97.5 F (36.4 C) 97.6 F (36.4 C)  SpO2: 98% 99%    General: Awake alert coherent no distress EOMI NCAT no focal deficit Right-sided IJ noted Cardiovascular: S1-S2 no murmur rub or gallop Respiratory: Chest clear Patient has lower extremity wound VAC in place  Moving all 4 limbs equally  Discharge Instructions   Discharge Instructions    Diet - low sodium heart healthy   Complete by: As directed    Discharge instructions   Complete by: As directed    Take your steroids as directed as a taper-you will eventually need to  resume your 5 mg dosing once you complete the taper We will prescribe for you a gluometer--you should check your blood sugar daily and I expect that your blood sugar will come down--once u leave the hopsital make sure you follow up with your MD--you might need medication for diabetes if your sugars remain high We will have to schedule you for the last appointment for Remdisicir, the Covid mediicne for tomorrow--make sure that you present yourself to get it Notice med changes to Lisinopril and Imdur   Increase activity slowly   Complete by: As directed      Allergies as of 11/11/2019      Reactions   Percocet [oxycodone-acetaminophen] Hives, Itching   Vicodin [hydrocodone-acetaminophen] Hives, Itching   Shellfish Allergy Hives   Tomato Hives      Medication List    STOP taking these medications   atorvastatin 80 MG tablet Commonly known as: LIPITOR   lisinopril 10 MG tablet Commonly known as: ZESTRIL   rosuvastatin 40 MG tablet Commonly known as: CRESTOR     TAKE these medications   aspirin EC 81 MG tablet Take 81 mg by mouth daily as needed.   blood glucose meter kit and supplies Kit Dispense based on patient and insurance preference. Use up to four times daily as directed. (FOR ICD-9 250.00, 250.01).   colchicine 0.6 MG tablet Take 0.6 mg by mouth daily as needed (for gout).   gabapentin 100 MG capsule Commonly known as: NEURONTIN Take 200 mg by mouth 3 (three) times daily as needed.   isosorbide mononitrate 30 MG 24 hr tablet Commonly known as: IMDUR Take 2 tablets (60 mg total) by mouth daily. What changed: medication strength   metoprolol tartrate 25 MG tablet Commonly known as: LOPRESSOR Take 25 mg by mouth 2 (two) times daily.   mycophenolate 360 MG Tbec EC tablet Commonly known as: MYFORTIC Take 720 mg by mouth 2 (two) times daily.   pantoprazole 40 MG tablet Commonly known as: PROTONIX Take 40 mg by mouth daily.   predniSONE 20 MG tablet Commonly  known as: DELTASONE Take 2 tablets (40 mg total) by mouth daily with breakfast for 3 days, THEN 1 tablet (20 mg total) daily with breakfast for 3 days, THEN 0.5 tablets (10 mg total) daily with breakfast for 3 days. And thewn resume your regular 5 mg dose. Start taking on: November 12, 2019 What changed:   medication strength  See the new instructions.   sodium bicarbonate 650 MG tablet Take 1,300 mg by mouth 2 (two) times daily.   tacrolimus 1 MG capsule Commonly known as: PROGRAF Take 2-3 mg by mouth 2 (two) times daily. Take '3mg'$  in the morning and '2mg'$  at night, 12 hours apart.   tamsulosin 0.4 MG Caps capsule Commonly known as: FLOMAX Take 0.4 mg by mouth at bedtime.   Xarelto 2.5 MG Tabs tablet Generic drug: rivaroxaban Take 2.5 mg by mouth 2 (two) times daily.      Allergies  Allergen Reactions  . Percocet [Oxycodone-Acetaminophen] Hives and Itching  . Vicodin [Hydrocodone-Acetaminophen] Hives and Itching  . Shellfish Allergy Hives  . Tomato Hives      The results of  significant diagnostics from this hospitalization (including imaging, microbiology, ancillary and laboratory) are listed below for reference.    Significant Diagnostic Studies: DG CHEST PORT 1 VIEW  Result Date: 11/08/2019 CLINICAL DATA:  Central line placement EXAM: PORTABLE CHEST 1 VIEW COMPARISON:  11/07/2019 FINDINGS: Right central line tip at the cavoatrial junction. No pneumothorax. Cardiomegaly. Bibasilar atelectasis. No effusions or edema. IMPRESSION: Right central line placement with the tip at the cavoatrial junction. No pneumothorax. Cardiomegaly with vascular congestion and bibasilar atelectasis. Electronically Signed   By: Rolm Baptise M.D.   On: 11/08/2019 18:26   DG Chest Port 1 View  Result Date: 11/07/2019 CLINICAL DATA:  Fever EXAM: PORTABLE CHEST 1 VIEW COMPARISON:  05/30/2019 FINDINGS: Heart is normal size. Prior median sternotomy. No confluent opacities or effusions. No acute bony  abnormality. IMPRESSION: No active disease. Electronically Signed   By: Rolm Baptise M.D.   On: 11/07/2019 22:15   DG Tibia/Fibula Left Port  Result Date: 11/07/2019 CLINICAL DATA:  Fever.  Wound infection EXAM: PORTABLE LEFT TIBIA AND FIBULA - 2 VIEW COMPARISON:  None. FINDINGS: Soft tissue defect/wound within the lateral soft tissues. Vascular calcifications. No acute bony abnormality. No bone destruction to suggest osteomyelitis. IMPRESSION: Lateral soft tissue wound.  No bony abnormality. Electronically Signed   By: Rolm Baptise M.D.   On: 11/07/2019 22:16   CT Renal Stone Study  Result Date: 11/08/2019 CLINICAL DATA:  Flank pain, diarrhea EXAM: CT ABDOMEN AND PELVIS WITHOUT CONTRAST TECHNIQUE: Multidetector CT imaging of the abdomen and pelvis was performed following the standard protocol without IV contrast. COMPARISON:  MRI abdomen Feb 19, 2015 FINDINGS: Lower chest: The visualized heart size within normal limits. No pericardial fluid/thickening. There is a small hiatal hernia. Minimal bibasilar dependent atelectasis is noted. Hepatobiliary: Although limited due to the lack of intravenous contrast, normal in appearance without gross focal abnormality. No evidence of calcified gallstones or biliary ductal dilatation. Pancreas:  Unremarkable.  No surrounding inflammatory changes. Spleen: Normal in size. Although limited due to the lack of intravenous contrast, normal in appearance. Adrenals/Urinary Tract: Both adrenal glands appear normal. There is atrophy of the bilateral native kidneys. There is a low-density lesion seen off the upper pole of the left kidney measuring 1.3 cm. No native kidney hydronephrosis is seen. There is a left lower pelvic renal transplant. There is mild pelviectasis as on a prior MRI. Scattered small tiny calcifications are seen. The largest measuring 2 mm in the lower pole. No ureteral dilatation is seen. There is mild bladder wall thickening seen diffusely. Minimal fat stranding  changes seen around the superior bladder. Stomach/Bowel: The stomach, small bowel, and colon are normal in appearance. No inflammatory changes or obstructive findings. Scattered colonic diverticula are noted without diverticulitis. Vascular/Lymphatic: There are no enlarged abdominal or pelvic lymph nodes. Scattered dense aortic atherosclerotic calcifications are seen without aneurysmal dilatation. Reproductive: The prostate is unremarkable. Other: There is a small rounded calcified nodule seen in the anterior left lower pelvis, likely area of fat necrosis. Musculoskeletal: No acute or significant osseous findings. Degenerative changes are seen throughout the thoracolumbar spine. IMPRESSION: Unchanged mild pelviectasis of the left renal transplant with nonobstructing tiny renal calcifications. Findings which could be suggestive of cystitis. Diverticulosis without diverticulitis. Aortic Atherosclerosis (ICD10-I70.0). Electronically Signed   By: Prudencio Pair M.D.   On: 11/08/2019 00:47   [place] EKG SITE RITE  Result Date: 11/08/2019 If Site Rite image not attached, placement could not be confirmed due to current cardiac rhythm.   Microbiology:  Recent Results (from the past 240 hour(s))  Blood culture (routine x 2)     Status: None (Preliminary result)   Collection Time: 11/07/19  9:20 PM   Specimen: BLOOD  Result Value Ref Range Status   Specimen Description BLOOD SITE NOT SPECIFIED  Final   Special Requests   Final    BOTTLES DRAWN AEROBIC AND ANAEROBIC Blood Culture adequate volume   Culture   Final    NO GROWTH 4 DAYS Performed at [facility], [address], [place], [place] [address]    Report Status PENDING  Incomplete  Blood culture (routine x 2)     Status: Abnormal   Collection Time: 11/07/19  9:40 PM   Specimen: BLOOD LEFT FOREARM  Result Value Ref Range Status   Specimen Description BLOOD LEFT FOREARM  Final   Special Requests   Final    BOTTLES DRAWN AEROBIC AND ANAEROBIC Blood  Culture adequate volume   Culture  Setup Time   Final    GRAM POSITIVE COCCI IN BOTH AEROBIC AND ANAEROBIC BOTTLES CRITICAL RESULT CALLED TO, READ BACK BY AND VERIFIED WITH: PHARMD M. PHAM [phone] FCP    Culture (A)  Final    STAPHYLOCOCCUS SPECIES (COAGULASE NEGATIVE) THE SIGNIFICANCE OF ISOLATING THIS ORGANISM FROM A SINGLE SET OF BLOOD CULTURES WHEN MULTIPLE SETS ARE DRAWN IS UNCERTAIN. PLEASE NOTIFY THE MICROBIOLOGY DEPARTMENT WITHIN ONE WEEK IF SPECIATION AND SENSITIVITIES ARE REQUIRED. Performed at [facility], [place] [address] [place], [place] [address]    Report Status 11/10/2019 FINAL  Final  Respiratory Panel by RT PCR (Flu A&B, Covid) - Nasopharyngeal Swab     Status: Abnormal   Collection Time: 11/07/19  9:46 PM   Specimen: Nasopharyngeal Swab  Result Value Ref Range Status   SARS Coronavirus 2 by RT PCR POSITIVE (A) NEGATIVE Final    Comment: RESULT CALLED TO, READ BACK BY AND VERIFIED WITH: Cristie Hem, RN AT [phone] ON 11/08/2019 BY SAINVILUS S (NOTE) SARS-CoV-2 target nucleic acids are DETECTED. SARS-CoV-2 RNA is generally detectable in upper respiratory specimens  during the acute phase of infection. Positive results are indicative of the presence of the identified virus, but do not rule out bacterial infection or co-infection with other pathogens not detected by the test. Clinical correlation with patient history and other diagnostic information is necessary to determine patient infection status. The expected result is Negative. Fact Sheet for Patients:  PinkCheek.be Fact Sheet for Healthcare Providers: GravelBags.it This test is not yet approved or cleared by the [place] FDA and  has been authorized for detection and/or diagnosis of SARS-CoV-2 by FDA under an Emergency Use Authorization (EUA).  This EUA will remain in effect (meaning this test c an be used) for the duration of  the COVID-19  declaration under Section 564(b)(1) of the Act, 21 U.S.C. section 360bbb-3(b)(1), unless the authorization is terminated or revoked sooner.    Influenza A by PCR NEGATIVE NEGATIVE Final   Influenza B by PCR NEGATIVE NEGATIVE Final    Comment: (NOTE) The Xpert Xpress SARS-CoV-2/FLU/RSV assay is intended as an aid in  the diagnosis of influenza from Nasopharyngeal swab specimens and  should not be used as a sole basis for treatment. Nasal washings and  aspirates are unacceptable for Xpert Xpress SARS-CoV-2/FLU/RSV  testing. Fact Sheet for Patients: PinkCheek.be Fact Sheet for Healthcare Providers: GravelBags.it This test is not yet approved or cleared by the [place] FDA and  has been authorized for detection and/or  diagnosis of SARS-CoV-2 by  FDA under an Emergency Use Authorization (EUA). This EUA will remain  in effect (meaning this test can be used) for the duration of the  Covid-19 declaration under Section 564(b)(1) of the Act, 21  U.S.C. section 360bbb-3(b)(1), unless the authorization is  terminated or revoked. Performed at [facility], [place] [address], [place], [place] [address]   Urine Culture     Status: Abnormal   Collection Time: 11/09/19  5:30 AM   Specimen: Urine, Clean Catch  Result Value Ref Range Status   Specimen Description   Final    URINE, CLEAN CATCH Performed at [facility], [place] [address] [place], [place] [address]    Special Requests   Final    NONE Performed at [facility], [place] [address], [place], [place] [address]    Culture (A)  Final    <10,000 COLONIES/mL INSIGNIFICANT GROWTH Performed at [place] [address], [place], [place] [address]    Report Status 11/10/2019 FINAL  Final     Labs: Basic Metabolic Panel: Recent Labs  Lab 11/07/19 2119 11/08/19 1030 11/09/19 0530 11/10/19 0520  NA 139 137 138 141  K 4.3 4.1  4.3 4.2  CL 106 108 109 115*  CO2 21* 16* 19* 19*  GLUCOSE 116* 87 123* 142*  BUN 41* 41* 46* 42*  CREATININE 3.60* 2.46* 1.81* 1.36*  CALCIUM 9.2 8.8* 8.8* 8.9   Liver Function Tests: Recent Labs  Lab 11/07/19 2119 11/09/19 0530 11/10/19 0520  AST '24 21 18  '$ ALT '16 14 13  '$ ALKPHOS 42 34* 34*  BILITOT 0.7 0.6 0.6  PROT 7.5 6.8 6.6  ALBUMIN 3.1* 2.9* 2.7*   Recent Labs  Lab 11/07/19 2119  LIPASE 44   No results for input(s): AMMONIA in the last 168 hours. CBC: Recent Labs  Lab 11/07/19 2119 11/08/19 1030 11/09/19 0530 11/10/19 0520  WBC 3.7* 3.3* 2.0* 3.5*  NEUTROABS 2.4 2.5 1.3* 2.5  HGB 13.6 13.4 11.6* 12.6*  HCT 43.8 41.3 36.7* 39.8  MCV 78.9* 76.2* 77.3* 77.3*  PLT 101* 88* 92* 117*   Cardiac Enzymes: No results for input(s): CKTOTAL, CKMB, CKMBINDEX, TROPONINI in the last 168 hours. BNP: BNP (last 3 results) No results for input(s): BNP in the last 8760 hours.  ProBNP (last 3 results) No results for input(s): PROBNP in the last 8760 hours.  CBG: Recent Labs  Lab 11/10/19 1511 11/10/19 2014 11/10/19 2303 11/11/19 0325 11/11/19 0744  GLUCAP 147* 248* 254* 126* 100*       Signed:  Nita Sells MD   [facility] 11/11/2019, 11:05 AM

## 2019-11-11 NOTE — Care Management Important Message (Signed)
Important Message  Patient Details  Name: Theodore Arellano MRN: [id number] Date of Birth: 1961/09/25   Medicare Important Message Given:  Yes - Important Message mailed due to current National Emergency  Verbal consent obtained due to current National Emergency  Relationship to patient: Mother Contact Name: Eulises Kijowski Call Date: 11/11/19  Time: 1204 Phone: [phone] Outcome: No Answer/Busy Important Message mailed to: Patient address on file    Delorse Lek 11/11/2019, 12:05 PM

## 2019-11-11 NOTE — Progress Notes (Signed)
Patient scheduled for outpatient Remdesivir infusion at 10:00 AM on Thursday 2/11.  Please advise them to report to [facility] at [address]  Drive to the security guard and tell them you are here for an infusion. They will direct you to the front entrance where we will come and get you.  For questions call [phone].  Thanks

## 2019-11-11 NOTE — TOC Initial Note (Addendum)
Transition of Care [facility]) - Initial/Assessment Note    Patient Details  Name: Theodore Arellano MRN: [id number] Date of Birth: 08/06/1961  Transition of Care [facility]) CM/SW Contact:    Alberteen Sam, [place] Phone Number: [phone] 11/11/2019, 2:46 PM  Clinical Narrative:                  CSW spoke with patient regarding discharge planning, reports he is active with [facility] and would require RN only at dc to assist with his wound vac at home.   CSW called [facility], they confirm patient is active for RN and will resume services today. Will fax dc summary and home health orders to [phone].  Patient reports he already has a 3in1 at home he uses but does request PTAR to be set up for home transportation. He requests PTAR be scheduled for 4:00 pm. RN made aware.   Update: CSW has faxed dc summary and home health orders to [facility].   Expected Discharge Plan: [place] Barriers to Discharge: No Barriers Identified   Patient Goals and CMS Choice Patient states their goals for this hospitalization and ongoing recovery are:: to go home CMS Medicare.gov Compare Post Acute Care list provided to:: Patient Choice offered to / list presented to : Patient  Expected Discharge Plan and Services Expected Discharge Plan: [place] Choice: [place] arrangements for the past 2 months: Single Family Home Expected Discharge Date: 11/11/19                         HH Arranged: RN [place] Agency: [place] Date [place]: 11/11/19 Time [place]: 0245    Prior Living Arrangements/Services Living arrangements for the past 2 months: [place] with:: Self Patient language and need for interpreter reviewed:: Yes Do you feel safe going back to the place where you live?: Yes      Need for Family Participation in Patient Care: No  (Comment) Care giver support system in place?: Yes (comment)   Criminal Activity/Legal Involvement Pertinent to Current Situation/Hospitalization: No - Comment as needed  Activities of Daily Living Home Assistive Devices/Equipment: Cane (specify quad or straight) ADL Screening (condition at time of admission) Patient's cognitive ability adequate to safely complete daily activities?: Yes Is the patient deaf or have difficulty hearing?: No Does the patient have difficulty seeing, even when wearing glasses/contacts?: No Does the patient have difficulty concentrating, remembering, or making decisions?: No Patient able to express need for assistance with ADLs?: Yes Does the patient have difficulty dressing or bathing?: Yes Independently performs ADLs?: Yes (appropriate for developmental age) Does the patient have difficulty walking or climbing stairs?: Yes Weakness of Legs: Left Weakness of Arms/Hands: None  Permission Sought/Granted                  Emotional Assessment Appearance:: Other (Comment Required(unable to assess - remote) Attitude/Demeanor/Rapport: Gracious Affect (typically observed): Calm Orientation: : Oriented to Self, Oriented to Place, Oriented to  Time, Oriented to Situation Alcohol / Substance Use: Not Applicable Psych Involvement: No (comment)  Admission diagnosis:  Wound infection [T14.8XXA, L08.9] AKI (acute kidney injury) ([place]) [N17.9] Diarrhea, unspecified type [R19.7] COVID-19 virus infection [U07.1] Patient Active Problem List   Diagnosis Date Noted  . AKI (acute kidney injury) ([place]) 11/08/2019  . COVID-19 virus infection 11/08/2019  .  History of DVT (deep vein thrombosis) 11/08/2019  . Unstable angina pectoris due to coronary arteriosclerosis ([place]) 05/30/2019  . Coronary artery disease involving native coronary artery of native heart with unstable angina pectoris ([place])   . Coronary artery disease due to lipid rich plaque   . Elevated troponin   .  Chest pain 08/27/2016  . Renal transplant recipient 08/27/2016  . Essential hypertension 08/27/2016  . HLD (hyperlipidemia) 08/27/2016  . Type 2 diabetes mellitus with vascular disease ([place]) 08/27/2016  . Left groin pain 06/24/2013   PCP:  [name], MD Pharmacy:   [id number] - [place], [place] [address] Phone: [phone] Fax: [phone]     Social Determinants of Health (SDOH) Interventions    Readmission Risk Interventions No flowsheet data found.

## 2019-11-12 ENCOUNTER — Ambulatory Visit (HOSPITAL_COMMUNITY)
Admission: RE | Admit: 2019-11-12 | Discharge: 2019-11-12 | Disposition: A | Discharge status: Home / Self Care | Payer: Medicare Other | Source: Ambulatory Visit | Attending: Pulmonary Disease | Admitting: Pulmonary Disease

## 2019-11-12 DIAGNOSIS — U071 COVID-19: Secondary | ICD-10-CM | POA: Diagnosis not present

## 2019-11-12 DIAGNOSIS — J1282 Pneumonia due to coronavirus disease 2019: Secondary | ICD-10-CM | POA: Diagnosis not present

## 2019-11-12 LAB — CULTURE, BLOOD (ROUTINE X 2)
Culture: NO GROWTH
Special Requests: ADEQUATE

## 2019-11-12 MED ORDER — METHYLPREDNISOLONE SODIUM SUCC 125 MG IJ SOLR: 125.0000 mg | Freq: Once | INTRAMUSCULAR | Status: DC | PRN

## 2019-11-12 MED ORDER — FAMOTIDINE IN NACL 20-0.9 MG/50ML-% IV SOLN: 20.0000 mg | Freq: Once | INTRAVENOUS | Status: DC | PRN

## 2019-11-12 MED ORDER — SODIUM CHLORIDE 0.9 % IV SOLN: INTRAVENOUS | Status: DC | PRN

## 2019-11-12 MED ORDER — SODIUM CHLORIDE 0.9 % IV SOLN
100.0000 mg | Freq: Once | INTRAVENOUS | Status: AC
  Administered 2019-11-12: 100 mg via INTRAVENOUS
  Filled 2019-11-12: qty 20

## 2019-11-12 MED ORDER — DIPHENHYDRAMINE HCL 50 MG/ML IJ SOLN: 50.0000 mg | Freq: Once | INTRAMUSCULAR | Status: DC | PRN

## 2019-11-12 MED ORDER — EPINEPHRINE 0.3 MG/0.3ML IJ SOAJ: 0.3000 mg | Freq: Once | INTRAMUSCULAR | Status: DC | PRN

## 2019-11-12 MED ORDER — ALBUTEROL SULFATE HFA 108 (90 BASE) MCG/ACT IN AERS: 2.0000 | INHALATION_SPRAY | Freq: Once | RESPIRATORY_TRACT | Status: DC | PRN

## 2019-11-12 NOTE — Progress Notes (Signed)
  Diagnosis: COVID-19  Physician: Dr. Joya Gaskins  Procedure: Covid Infusion Clinic Med: remdesivir infusion.  Complications: No immediate complications noted.  Discharge: Discharged home   Theodore Arellano 11/12/2019

## 2019-11-14 LAB — TACROLIMUS LEVEL: Tacrolimus (FK506) - LabCorp: 8.9 ng/mL (ref 2.0–20.0)

## 2019-11-26 ENCOUNTER — Ambulatory Visit: Payer: Medicare Other | Admitting: Sports Medicine

## 2019-12-11 ENCOUNTER — Ambulatory Visit: Payer: Medicare Other | Admitting: Sports Medicine

## 2019-12-26 ENCOUNTER — Other Ambulatory Visit: Payer: Self-pay

## 2019-12-26 ENCOUNTER — Emergency Department (HOSPITAL_COMMUNITY)
Admission: EM | Admit: 2019-12-26 | Discharge: 2019-12-26 | Disposition: A | Discharge status: Home / Self Care | Payer: Medicare Other | Attending: Emergency Medicine | Admitting: Emergency Medicine

## 2019-12-26 DIAGNOSIS — E119 Type 2 diabetes mellitus without complications: Secondary | ICD-10-CM | POA: Insufficient documentation

## 2019-12-26 DIAGNOSIS — Z7982 Long term (current) use of aspirin: Secondary | ICD-10-CM | POA: Diagnosis not present

## 2019-12-26 DIAGNOSIS — Z79899 Other long term (current) drug therapy: Secondary | ICD-10-CM | POA: Diagnosis not present

## 2019-12-26 DIAGNOSIS — Z4800 Encounter for change or removal of nonsurgical wound dressing: Secondary | ICD-10-CM | POA: Insufficient documentation

## 2019-12-26 DIAGNOSIS — Z8616 Personal history of COVID-19: Secondary | ICD-10-CM | POA: Insufficient documentation

## 2019-12-26 DIAGNOSIS — Z4689 Encounter for fitting and adjustment of other specified devices: Secondary | ICD-10-CM

## 2019-12-26 DIAGNOSIS — I2510 Atherosclerotic heart disease of native coronary artery without angina pectoris: Secondary | ICD-10-CM | POA: Insufficient documentation

## 2019-12-26 DIAGNOSIS — F17210 Nicotine dependence, cigarettes, uncomplicated: Secondary | ICD-10-CM | POA: Diagnosis not present

## 2019-12-26 DIAGNOSIS — Z940 Kidney transplant status: Secondary | ICD-10-CM | POA: Insufficient documentation

## 2019-12-26 DIAGNOSIS — I10 Essential (primary) hypertension: Secondary | ICD-10-CM | POA: Insufficient documentation

## 2019-12-26 DIAGNOSIS — Z7689 Persons encountering health services in other specified circumstances: Secondary | ICD-10-CM

## 2019-12-26 NOTE — ED Provider Notes (Signed)
[facility] DEPT Provider Note   CSN: [id number] Arrival date & time: 12/26/19  5701     History Chief Complaint  Patient presents with  . Wound Check    wound vac came loose     Theodore Arellano is a 59 y.o. male with a history of CAD, hypertension, hyperlipidemia, s/p renal transplant on prograf & mycophenolate, prior DVT on xarelto, & PAD who presents to the ED with complaints of wound vac dysfunction today. Patient states he has a chronic wound to the left lower leg which has been present since his surgery in October 2020 and has had a wound vac since December 2020. He has a nurse that comes to his home to change the wound vac dressing on Mondays and Thursdays- most recently came 2 day ago on Thursday. He states the wound has been doing well, no drainage, increased erythema, or warmth. The nurse who changed his dressing stated things looked good. Last night he was gambling and his battery pack ran out, he states typically it beeps, but did not, the wound vac subsequently disattached, he is unsure when exactly this happened. He is here today to see if this problem cam be resolved. He denies fever, chills, numbness, weakness, or acute swelling (does have baseline swelling around the wound).    Per chart review of most recent vascular note: Patient is s/p Left femoral to posterior tibial artery bypass with cryovein- Left external ilia, common femoral and profunda endarterectomy and profundaplasty with patch angioplasty in 10/ 2020. Followed by Incision and drainage of a left lower extremity abscess, debridement of skin, subcutaneous tissue, fascia, and muscle measuring 12 cm x 3 cm Wound Vac placement with skin opening 6 x 3 cm in 12/20.       HPI     Past Medical History:  Diagnosis Date  . Anxiety   . Arthritis    "lower back" (11/28/2017)  . CAD (coronary artery disease)   . Chronic lower back pain   . H/O immunosuppressive therapy    chronic/notes  11/28/2017  . History of gout    "before kidney transport" (11/28/2017)  . Hyperlipidemia   . Hypertension   . Kidney disease    s/p kidney transplant 2011; "not on dialysis now" (11/28/2017)    Patient Active Problem List   Diagnosis Date Noted  . AKI (acute kidney injury) ([place]) 11/08/2019  . COVID-19 virus infection 11/08/2019  . History of DVT (deep vein thrombosis) 11/08/2019  . Unstable angina pectoris due to coronary arteriosclerosis ([place]) 05/30/2019  . Coronary artery disease involving native coronary artery of native heart with unstable angina pectoris ([place])   . Coronary artery disease due to lipid rich plaque   . Elevated troponin   . Chest pain 08/27/2016  . Renal transplant recipient 08/27/2016  . Essential hypertension 08/27/2016  . HLD (hyperlipidemia) 08/27/2016  . Type 2 diabetes mellitus with vascular disease ([place]) 08/27/2016  . Left groin pain 06/24/2013    Past Surgical History:  Procedure Laterality Date  . ARTERIOVENOUS GRAFT PLACEMENT Right 04/2007   Archie Endo 02/01/2011  . AV FISTULA PLACEMENT Left 12/20/2005; 12/2006   Archie Endo 5/15/2012Marland Kitchen Archie Endo 02/13/2011  . CARDIAC CATHETERIZATION    . HD access procedures    . INGUINAL HERNIA REPAIR Left   . KIDNEY TRANSPLANT  2011  . LEFT HEART CATH AND CORONARY ANGIOGRAPHY N/A 11/29/2017   Procedure: LEFT HEART CATH AND CORONARY ANGIOGRAPHY;  Surgeon: Leonie Man, MD;  Location: [facility]  CV LAB;  Service: Cardiovascular;  Laterality: N/A;  . LEFT HEART CATH AND CORONARY ANGIOGRAPHY N/A 06/01/2019   Procedure: LEFT HEART CATH AND CORONARY ANGIOGRAPHY;  Surgeon: Burnell Blanks, MD;  Location: [facility] LAB;  Service: Cardiovascular;  Laterality: N/A;  . LEFT HEART CATHETERIZATION WITH CORONARY ANGIOGRAM  03/11/2012   Procedure: LEFT HEART CATHETERIZATION WITH CORONARY ANGIOGRAM;  Surgeon: Jolaine Artist, MD;  Location: [facility] CATH LAB;  Service: Cardiovascular;;  . RIGHT HEART CATHETERIZATION N/A 03/11/2012    Procedure: RIGHT HEART CATH;  Surgeon: Jolaine Artist, MD;  Location: [facility] CATH LAB;  Service: Cardiovascular;  Laterality: N/A;  . THROMBECTOMY Right 12/2007   Archie Endo 02/01/2011  . THROMBECTOMY / ARTERIOVENOUS GRAFT REVISION Left 12/2006   Archie Endo 02/13/2011  . THROMBECTOMY / ARTERIOVENOUS GRAFT REVISION Right 07/2007; 10/2007;01/2008;   Archie Endo 01/31/2011; Archie Endo 02/01/2011; Archie Endo 01/31/2011  . THROMBECTOMY AND REVISION OF ARTERIOVENTOUS (AV) GORETEX  GRAFT  03/2007 X 2   /notes 02/01/2011       Family History  Problem Relation Age of Onset  . Hypertension Father   . Diabetes Mother     Social History   Tobacco Use  . Smoking status: Current Every Day Smoker    Packs/day: 0.50    Years: 40.00    Pack years: 20.00    Types: Cigarettes  . Smokeless tobacco: Never Used  Substance Use Topics  . Alcohol use: Yes    Alcohol/week: 12.0 standard drinks    Types: 12 Cans of beer per week  . Drug use: No    Home Medications Prior to Admission medications   Medication Sig Start Date End Date Taking? Authorizing Provider  aspirin EC 81 MG tablet Take 81 mg by mouth daily as needed.     [provider]  atorvastatin (LIPITOR) 80 MG tablet Take 80 mg by mouth at bedtime. 09/13/19   [provider]  blood glucose meter kit and supplies KIT Dispense based on patient and insurance preference. Use up to four times daily as directed. (FOR ICD-9 250.00, 250.01). 11/11/19   Nita Sells, MD  colchicine 0.6 MG tablet Take 0.6 mg by mouth daily as needed (for gout).     [provider]  gabapentin (NEURONTIN) 100 MG capsule Take 200 mg by mouth 3 (three) times daily as needed. 10/30/19   [provider]  isosorbide mononitrate (IMDUR) 30 MG 24 hr tablet Take 2 tablets (60 mg total) by mouth daily. 11/11/19   Nita Sells, MD  metoprolol tartrate (LOPRESSOR) 25 MG tablet Take 25 mg by mouth 2 (two) times daily. 10/30/19   [provider]    mycophenolate (MYFORTIC) 360 MG TBEC EC tablet Take 720 mg by mouth 2 (two) times daily.    [provider]  pantoprazole (PROTONIX) 40 MG tablet Take 40 mg by mouth daily. 11/02/19   [provider]  sodium bicarbonate 650 MG tablet Take 1,300 mg by mouth 2 (two) times daily.    [provider]  tacrolimus (PROGRAF) 1 MG capsule Take 2-3 mg by mouth 2 (two) times daily. Take '3mg'$  in the morning and '2mg'$  at night, 12 hours apart.    [provider]  tamsulosin (FLOMAX) 0.4 MG CAPS capsule Take 0.4 mg by mouth at bedtime. 08/28/19   [provider]  XARELTO 2.5 MG TABS tablet Take 2.5 mg by mouth 2 (two) times daily. 09/04/19   [provider]    Allergies    Percocet [oxycodone-acetaminophen], Vicodin [hydrocodone-acetaminophen],  Shellfish allergy, Chocolate, and Tomato  Review of Systems   Review of Systems  Constitutional: Negative for chills and fever.  Respiratory: Negative for shortness of breath.   Cardiovascular: Positive for leg swelling (LLE, baseline, no acute change). Negative for chest pain.  Gastrointestinal: Negative for abdominal pain and vomiting.  Skin: Positive for wound. Negative for color change, pallor and rash.  Neurological: Negative for weakness and numbness.    Physical Exam Updated Vital Signs BP (!) 164/93 (BP Location: Left Arm)   Pulse 70   Temp 97.8 F (36.6 C)   Resp 19   Ht '5\' 8"'$  (1.727 m)   Wt 78.9 kg   SpO2 100%   BMI 26.46 kg/m   Physical Exam Vitals and nursing note reviewed.  Constitutional:      General: He is not in acute distress.    Appearance: He is not ill-appearing or toxic-appearing.  HENT:     Head: Normocephalic and atraumatic.  Cardiovascular:     Rate and Rhythm: Normal rate and regular rhythm.     Pulses:          Dorsalis pedis pulses are 1+ on the left side.       Posterior tibial pulses are 1+ on the left side.     Comments: Midline sternotomy scar present.   Pulmonary:     Effort: Pulmonary effort is normal.  Musculoskeletal:     Comments: Lower extremities: left lower leg wound vac sponge and seal present with portion of wound vac disattached. Mild surrounding trace edema. No drainage. No warmth to the touch. Not particularly tender to palpation.   Skin:    General: Skin is warm and dry.     Capillary Refill: Capillary refill takes less than 2 seconds.  Neurological:     Mental Status: He is alert.     Comments: Alert. Clear speech. Sensation grossly intact to bilateral lower extremities. 5/5 strength with plantar/dorsiflexion bilaterally. Patient ambulatory.   Psychiatric:        Mood and Affect: Mood normal.        Behavior: Behavior normal.     ED Results / Procedures / Treatments   Labs (all labs ordered are listed, but only abnormal results are displayed) Labs Reviewed - No data to display  EKG None  Radiology No results found.  Procedures Procedures (including critical care time)  Medications Ordered in ED Medications - No data to display  ED Course  I have reviewed the triage vital signs and the nursing notes.  Pertinent labs & imaging results that were available during my care of the patient were reviewed by me and considered in my medical decision making (see chart for details).    MDM Rules/Calculators/A&P                     Patient presents for wound vac dysfunction.  Nontoxic, resting comfortably, vitals WNL with the exception of elevated BP- doubt HTN emergency. On exam wound vac sponge/seal in place but disattached from tubing that attaches to battery pack. No significant signs of infection, patient states wound appears lower leg appears at baseline. He has 1+ pulses in the LLE which is consistent with most recent vascular note in chart review. I discussed with our nursing staff, we unfortunately do not have supplies at this hospital to be able to change the wound vac dressing for the patient and we also  unfortunately do not have a wound care nurse on the weekends. Discussed  further with the patient, he is to call his wound care nurse line to have someone from the team that typically comes out Monday/Thursday to come today to change this for him, he is in agreement with this. Stressed importance of doing so today. I discussed treatment plan, need for follow-up, and return precautions with the patient. Provided opportunity for questions, patient confirmed understanding and is in agreement with plan.    Final Clinical Impression(s) / ED Diagnoses Final diagnoses:  Encounter for management of wound VAC    Rx / DC Orders ED Discharge Orders    None       Amaryllis Dyke, PA-C 12/26/19 0740    Dorie Rank, MD 12/26/19 [phone]

## 2019-12-26 NOTE — Discharge Instructions (Signed)
You were seen in the emergency department today due to a problem with your wound VAC.  We unfortunately do not have the supplies in the emergency department/at the hospital to change this today.  We would like you to call your wound care nurse line as soon as possible this morning to have them come out and change the dressing today.  Please keep the sponge and overlying dressing in place until you are seen by your wound care nurse.  Return to the emergency department for new or worsening symptoms including but not limited to increased pain to the wound, redness, drainage, swelling, fever, chills, or any other concerns.

## 2019-12-26 NOTE — ED Triage Notes (Signed)
Pt reports his wound vac turned off and he didn't realize and detached from wound dressing.

## 2019-12-29 ENCOUNTER — Other Ambulatory Visit: Payer: Self-pay | Admitting: Nephrology

## 2019-12-29 ENCOUNTER — Other Ambulatory Visit: Payer: Self-pay

## 2019-12-29 ENCOUNTER — Ambulatory Visit
Admission: RE | Admit: 2019-12-29 | Discharge: 2019-12-29 | Disposition: A | Discharge status: Home / Self Care | Payer: Medicare Other | Source: Ambulatory Visit | Attending: Nephrology | Admitting: Nephrology

## 2019-12-29 DIAGNOSIS — M1000 Idiopathic gout, unspecified site: Secondary | ICD-10-CM

## 2020-01-06 ENCOUNTER — Other Ambulatory Visit: Payer: Self-pay

## 2020-01-06 ENCOUNTER — Encounter: Payer: Self-pay | Admitting: Podiatrist

## 2020-01-06 ENCOUNTER — Telehealth: Payer: Self-pay | Admitting: *Deleted

## 2020-01-06 ENCOUNTER — Ambulatory Visit (INDEPENDENT_AMBULATORY_CARE_PROVIDER_SITE_OTHER): Payer: Medicare Other | Admitting: Podiatrist

## 2020-01-06 DIAGNOSIS — N186 End stage renal disease: Secondary | ICD-10-CM | POA: Insufficient documentation

## 2020-01-06 DIAGNOSIS — L602 Onychogryphosis: Secondary | ICD-10-CM

## 2020-01-06 DIAGNOSIS — B351 Tinea unguium: Secondary | ICD-10-CM

## 2020-01-06 DIAGNOSIS — M7989 Other specified soft tissue disorders: Secondary | ICD-10-CM | POA: Diagnosis not present

## 2020-01-06 DIAGNOSIS — E1159 Type 2 diabetes mellitus with other circulatory complications: Secondary | ICD-10-CM

## 2020-01-06 DIAGNOSIS — M79672 Pain in left foot: Secondary | ICD-10-CM

## 2020-01-06 DIAGNOSIS — R609 Edema, unspecified: Secondary | ICD-10-CM

## 2020-01-06 DIAGNOSIS — M79662 Pain in left lower leg: Secondary | ICD-10-CM

## 2020-01-06 NOTE — Telephone Encounter (Signed)
-----   Message from Bronson Ing, DPM sent at 01/06/2020 12:56 PM EDT ----- Regarding: dvt vascular study left Hi Valery-  Wanted to get a vascular study (venous- ultrasound r/o dvt-  pain swelling left foot and leg)  Would you mind ordering?  Thanks!!  Dr. Johnette Abraham

## 2020-01-06 NOTE — Progress Notes (Signed)
   Chief Complaint  Patient presents with  . Ankle Pain    Left ankle lateral aspect pain, 1 month duration. Pt states no known injuries. Denies fever/chills/nausea/vomiting. Pt states he has wound vac on left lower extremity and is unsure if it is related.  . Nail Problem    Nail trim 1-5 bilateral     HPI: Patient is 59 y.o. male who presents today for pain in the left heel and lower leg for the past month. He has a wound vac. He denies any injury. He has a wound vac in place on the leg due to a non healing wound.  Toenails are also long, thick and and he would like them trimmed.    Allergies  Allergen Reactions  . Percocet [Oxycodone-Acetaminophen] Hives and Itching  . Vicodin [Hydrocodone-Acetaminophen] Hives and Itching  . Shellfish Allergy Hives  . Chocolate Flavor Other (See Comments)    Unknown reaction  . Chocolate Hives  . Tomato Hives    Review of systems is reviewed and negative.   Physical Exam  Patient is awake, alert, and oriented x 3.  In no acute distress.    Vascular status is decreased  with non palpable pedal pulses DP and PT bilateral and capillary refill time less than 3 seconds bilateral. Non pitting edema and mild erythema noted left foot compared to right.   Neurological exam reveals epicritic and protective sensation grossly intact bilateral.   Dermatological exam reveals elongated and thickened, mycotic toenails x 10. Dry skin of the left heel and lateral foot noted.  Wound vac is in place on the left lower leg- patient states it is from surgery for revascularization where the wound would not heal. Wound vac left undisturbed.    Musculoskeletal exam: Musculature intact with dorsiflexion, plantarflexion, inversion, eversion. Ankle and First MPJ joint range of motion normal.  Exquisite pain is present with compression on the left heel and posterior left achilles and up to the wound vac dressing.  Unable to evaluate the calf due to wound vac.     Assessment: Type 2 diabetes mellitus with vascular disease (HCC)  Onychomycosis  Overgrown toenails  Swelling of left foot  Left foot pain  Rule out DVT   Plan: Recommended a vascular study to rule out dvt as he is very symptomatic with pressure and the left foot and leg are significantly more swollen than the right.  Will set this up asap.  Will call with result and further recommendations.  Also trimmed nails today in length and thickness.

## 2020-01-06 NOTE — Patient Instructions (Signed)
We will order a vascular study for you and call with the results   Deep Vein Thrombosis  Deep vein thrombosis (DVT) is a condition in which a blood clot forms in a deep vein, such as a lower leg, thigh, or arm vein. A clot is blood that has thickened into a gel or solid. This condition is dangerous. It can lead to serious and even life-threatening complications if the clot travels to the lungs and causes a blockage (pulmonary embolism). It can also damage veins in the leg. This can result in leg pain, swelling, discoloration, and sores (post-thrombotic syndrome). What are the causes? This condition may be caused by:  A slowdown of blood flow.  Damage to a vein.  A condition that causes blood to clot more easily, such as an inherited clotting disorder. What increases the risk? The following factors may make you more likely to develop this condition:  Being overweight.  Being older, especially over age 75.  Sitting or lying down for more than four hours.  Being in the hospital.  Lack of physical activity (sedentary lifestyle).  Pregnancy, being in childbirth, or having recently given birth.  Taking medicines that contain estrogen, such as medicines to prevent pregnancy.  Smoking.  A history of any of the following: ? Blood clots or a blood clotting disease. ? Peripheral vascular disease. ? Inflammatory bowel disease. ? Cancer. ? Heart disease. ? Genetic conditions that affect how your blood clots, such as Factor V Leiden mutation. ? Neurological diseases that affect your legs (leg paresis). ? A recent injury, such as a car accident. ? Major or lengthy surgery. ? A central line placed inside a large vein. What are the signs or symptoms? Symptoms of this condition include:  Swelling, pain, or tenderness in an arm or leg.  Warmth, redness, or discoloration in an arm or leg. If the clot is in your leg, symptoms may be more noticeable or worse when you stand or walk. Some  people may not develop any symptoms. How is this diagnosed? This condition is diagnosed with:  A medical history and physical exam.  Tests, such as: ? Blood tests. These are done to check how well your blood clots. ? Ultrasound. This is done to check for clots. ? Venogram. For this test, contrast dye is injected into a vein and X-rays are taken to check for any clots. How is this treated? Treatment for this condition depends on:  The cause of your DVT.  Your risk for bleeding or developing more clots.  Any other medical conditions that you have. Treatment may include:  Taking a blood thinner (anticoagulant). This type of medicine prevents clots from forming. It may be taken by mouth, injected under the skin, or injected through an IV (catheter).  Injecting clot-dissolving medicines into the affected vein (catheter-directed thrombolysis).  Having surgery. Surgery may be done to: ? Remove the clot. ? Place a filter in a large vein to catch blood clots before they reach the lungs. Some treatments may be continued for up to six months. Follow these instructions at home: If you are taking blood thinners:  Take the medicine exactly as told by your health care provider. Some blood thinners need to be taken at the same time every day. Do not skip a dose.  Talk with your health care provider before you take any medicines that contain aspirin or NSAIDs. These medicines increase your risk for dangerous bleeding.  Ask your health care provider about foods and drugs  that could change the way the medicine works (may interact). Avoid those things if your health care provider tells you to do so.  Blood thinners can cause easy bruising and may make it difficult to stop bleeding. Because of this: ? Be very careful when using knives, scissors, or other sharp objects. ? Use an electric razor instead of a blade. ? Avoid activities that could cause injury or bruising, and follow instructions about  how to prevent falls.  Wear a medical alert bracelet or carry a card that lists what medicines you take. General instructions  Take over-the-counter and prescription medicines only as told by your health care provider.  Return to your normal activities as told by your health care provider. Ask your health care provider what activities are safe for you.  Wear compression stockings if recommended by your health care provider.  Keep all follow-up visits as told by your health care provider. This is important. How is this prevented? To lower your risk of developing this condition again:  For 30 or more minutes every day, do an activity that: ? Involves moving your arms and legs. ? Increases your heart rate.  When traveling for longer than four hours: ? Exercise your arms and legs every hour. ? Drink plenty of water. ? Avoid drinking alcohol.  Avoid sitting or lying for a long time without moving your legs.  If you have surgery or you are hospitalized, ask about ways to prevent blood clots. These may include taking frequent walks or using anticoagulants.  Stay at a healthy weight.  If you are a woman who is older than age 78, avoid unnecessary use of medicines that contain estrogen, such as some birth control pills.  Do not use any products that contain nicotine or tobacco, such as cigarettes and e-cigarettes. This is especially important if you take estrogen medicines. If you need help quitting, ask your health care provider. Contact a health care provider if:  You miss a dose of your blood thinner.  Your menstrual period is heavier than usual.  You have unusual bruising. Get help right away if:  You have: ? New or increased pain, swelling, or redness in an arm or leg. ? Numbness or tingling in an arm or leg. ? Shortness of breath. ? Chest pain. ? A rapid or irregular heartbeat. ? A severe headache or confusion. ? A cut that will not stop bleeding.  There is blood in  your vomit, stool, or urine.  You have a serious fall or accident, or you hit your head.  You feel light-headed or dizzy.  You cough up blood. These symptoms may represent a serious problem that is an emergency. Do not wait to see if the symptoms will go away. Get medical help right away. Call your local emergency services (911 in the [place] Do not drive yourself to the hospital. Summary  Deep vein thrombosis (DVT) is a condition in which a blood clot forms in a deep vein, such as a lower leg, thigh, or arm vein.  Symptoms can include swelling, warmth, pain, and redness in your leg or arm.  This condition may be treated with a blood thinner (anticoagulant medicine), medicine that is injected to dissolve blood clots,compression stockings, or surgery.  If you are prescribed blood thinners, take them exactly as told. This information is not intended to replace advice given to you by your health care provider. Make sure you discuss any questions you have with your health care provider. Document Revised:  08/30/2017 Document Reviewed: 02/15/2017 Elsevier Patient Education  [place].

## 2020-01-06 NOTE — Telephone Encounter (Signed)
Faxed orders to [facility]. 

## 2020-01-08 ENCOUNTER — Telehealth: Payer: Self-pay | Admitting: *Deleted

## 2020-01-08 ENCOUNTER — Ambulatory Visit (HOSPITAL_BASED_OUTPATIENT_CLINIC_OR_DEPARTMENT_OTHER)
Admission: RE | Admit: 2020-01-08 | Discharge: 2020-01-08 | Disposition: A | Discharge status: Home / Self Care | Payer: Medicare Other | Source: Ambulatory Visit | Attending: Cardiovascular Disease | Admitting: Cardiovascular Disease

## 2020-01-08 ENCOUNTER — Telehealth: Payer: Self-pay

## 2020-01-08 ENCOUNTER — Other Ambulatory Visit: Payer: Self-pay | Admitting: Podiatrist

## 2020-01-08 DIAGNOSIS — R531 Weakness: Secondary | ICD-10-CM | POA: Diagnosis not present

## 2020-01-08 DIAGNOSIS — R609 Edema, unspecified: Secondary | ICD-10-CM | POA: Insufficient documentation

## 2020-01-08 DIAGNOSIS — T8141XA Infection following a procedure, superficial incisional surgical site, initial encounter: Secondary | ICD-10-CM | POA: Diagnosis not present

## 2020-01-08 DIAGNOSIS — M79662 Pain in left lower leg: Secondary | ICD-10-CM | POA: Insufficient documentation

## 2020-01-08 MED ORDER — AMOXICILLIN-POT CLAVULANATE 875-125 MG PO TABS: 1.0000 | ORAL_TABLET | Freq: Two times a day (BID) | ORAL | 0 refills | Status: DC

## 2020-01-08 NOTE — Telephone Encounter (Signed)
I spoke with pt and informed of the venous doppler results and antibiotic order.

## 2020-01-08 NOTE — Telephone Encounter (Signed)
Pt came in to office today for VAS [place] lower extremity venous left today. While lobby, pt lost balance in his left foot and landed on his elbows. He denies dizziness or lightheadedness.  Dr.[place] evaluated pt in lobby.  Able to get pt up to wheelchair and take him back to [place].  His V/S are as follows: BP 120/68 HR 68 O2 88 on room air Glucose 97  Pt states that he took a pain pill this morning (hydromorphone) and pulled out his pill bottle- the pill bottle had wholes in it and had different types of pills.  Pt helped up to the bed for his VAS [place] at this time. [place] tech to help pt call his daughter for help when he gets home.  Pt's primary Card. Is Dr.Revankar- will route to MD to make aware.

## 2020-01-08 NOTE — Telephone Encounter (Signed)
Agree  Thank you

## 2020-01-08 NOTE — Telephone Encounter (Signed)
-----   Message from Bronson Ing, DPM sent at 01/08/2020 12:55 PM EDT ----- Regarding: FW: LEFT LEG VENOUS DUPLEX Hi Marcy Siren and Janett Billow!  Wanted to see if someone could call this patient  (Mr. Rorke) and let him know his veins are fine and there is no DVT.  The pain and swelling could be from an infection and I have called in Augmentin to his pharmacy in [place].  He also needs to follow up with his wound care provider so his wound can be checked.  Thanks!  Dr. Johnette Abraham  ----- Message ----- From: Cira Servant, RVT Sent: 01/08/2020  10:46 AM EDT To: Bronson Ing, DPM Subject: LEFT LEG VENOUS DUPLEX                         This patient has no DVT in the left leg down to mid calf, patent BPG from groin to mid calf.  I didn't remove the wound vac dressings.  I threw a doppler on the pedal artery on the plantar surface of foot and it was normal.  He does have an enlarged lymph node in the upper thigh appx 3.0 cm.....could the swelling be from a cellulitis??   Thanks, Salvadore Dom, RVT [facility]

## 2020-01-08 NOTE — Progress Notes (Signed)
Dopplers negative for DVT-  Calling in Augmentin to  Address swelling may be due to cellulitus from wound.  Will have him start taking today and follow up for leg wound.

## 2020-01-11 ENCOUNTER — Inpatient Hospital Stay (HOSPITAL_COMMUNITY)
Admission: EM | Admit: 2020-01-11 | Discharge: 2020-01-13 | DRG: 863 | Payer: Medicare Other | Attending: Internal Medicine | Admitting: Internal Medicine

## 2020-01-11 ENCOUNTER — Other Ambulatory Visit: Payer: Self-pay

## 2020-01-11 ENCOUNTER — Encounter (HOSPITAL_COMMUNITY): Payer: Self-pay | Admitting: *Deleted

## 2020-01-11 ENCOUNTER — Emergency Department (HOSPITAL_COMMUNITY): Payer: Medicare Other

## 2020-01-11 ENCOUNTER — Ambulatory Visit: Payer: Medicare Other

## 2020-01-11 DIAGNOSIS — I739 Peripheral vascular disease, unspecified: Secondary | ICD-10-CM | POA: Diagnosis not present

## 2020-01-11 DIAGNOSIS — I25110 Atherosclerotic heart disease of native coronary artery with unstable angina pectoris: Secondary | ICD-10-CM | POA: Diagnosis present

## 2020-01-11 DIAGNOSIS — A419 Sepsis, unspecified organism: Secondary | ICD-10-CM | POA: Diagnosis present

## 2020-01-11 DIAGNOSIS — Z833 Family history of diabetes mellitus: Secondary | ICD-10-CM

## 2020-01-11 DIAGNOSIS — Z7902 Long term (current) use of antithrombotics/antiplatelets: Secondary | ICD-10-CM | POA: Diagnosis not present

## 2020-01-11 DIAGNOSIS — G894 Chronic pain syndrome: Secondary | ICD-10-CM | POA: Diagnosis present

## 2020-01-11 DIAGNOSIS — I2583 Coronary atherosclerosis due to lipid rich plaque: Secondary | ICD-10-CM | POA: Diagnosis present

## 2020-01-11 DIAGNOSIS — I129 Hypertensive chronic kidney disease with stage 1 through stage 4 chronic kidney disease, or unspecified chronic kidney disease: Secondary | ICD-10-CM | POA: Diagnosis present

## 2020-01-11 DIAGNOSIS — E1151 Type 2 diabetes mellitus with diabetic peripheral angiopathy without gangrene: Secondary | ICD-10-CM | POA: Diagnosis present

## 2020-01-11 DIAGNOSIS — Z8249 Family history of ischemic heart disease and other diseases of the circulatory system: Secondary | ICD-10-CM | POA: Diagnosis not present

## 2020-01-11 DIAGNOSIS — T8144XA Sepsis following a procedure, initial encounter: Secondary | ICD-10-CM | POA: Diagnosis present

## 2020-01-11 DIAGNOSIS — Z91013 Allergy to seafood: Secondary | ICD-10-CM

## 2020-01-11 DIAGNOSIS — Y830 Surgical operation with transplant of whole organ as the cause of abnormal reaction of the patient, or of later complication, without mention of misadventure at the time of the procedure: Secondary | ICD-10-CM | POA: Diagnosis present

## 2020-01-11 DIAGNOSIS — Z8616 Personal history of COVID-19: Secondary | ICD-10-CM | POA: Diagnosis not present

## 2020-01-11 DIAGNOSIS — Z86718 Personal history of other venous thrombosis and embolism: Secondary | ICD-10-CM

## 2020-01-11 DIAGNOSIS — F17210 Nicotine dependence, cigarettes, uncomplicated: Secondary | ICD-10-CM | POA: Diagnosis present

## 2020-01-11 DIAGNOSIS — Z951 Presence of aortocoronary bypass graft: Secondary | ICD-10-CM | POA: Diagnosis not present

## 2020-01-11 DIAGNOSIS — I2510 Atherosclerotic heart disease of native coronary artery without angina pectoris: Secondary | ICD-10-CM

## 2020-01-11 DIAGNOSIS — L0390 Cellulitis, unspecified: Secondary | ICD-10-CM

## 2020-01-11 DIAGNOSIS — Z789 Other specified health status: Secondary | ICD-10-CM | POA: Diagnosis present

## 2020-01-11 DIAGNOSIS — T8619 Other complication of kidney transplant: Secondary | ICD-10-CM | POA: Diagnosis present

## 2020-01-11 DIAGNOSIS — Z7982 Long term (current) use of aspirin: Secondary | ICD-10-CM | POA: Diagnosis not present

## 2020-01-11 DIAGNOSIS — Z20822 Contact with and (suspected) exposure to covid-19: Secondary | ICD-10-CM | POA: Diagnosis present

## 2020-01-11 DIAGNOSIS — Z91018 Allergy to other foods: Secondary | ICD-10-CM

## 2020-01-11 DIAGNOSIS — Z79899 Other long term (current) drug therapy: Secondary | ICD-10-CM | POA: Diagnosis not present

## 2020-01-11 DIAGNOSIS — E1122 Type 2 diabetes mellitus with diabetic chronic kidney disease: Secondary | ICD-10-CM | POA: Diagnosis present

## 2020-01-11 DIAGNOSIS — Z7901 Long term (current) use of anticoagulants: Secondary | ICD-10-CM | POA: Diagnosis not present

## 2020-01-11 DIAGNOSIS — N179 Acute kidney failure, unspecified: Secondary | ICD-10-CM | POA: Diagnosis present

## 2020-01-11 DIAGNOSIS — Z885 Allergy status to narcotic agent status: Secondary | ICD-10-CM

## 2020-01-11 DIAGNOSIS — M86652 Other chronic osteomyelitis, left thigh: Secondary | ICD-10-CM | POA: Diagnosis not present

## 2020-01-11 DIAGNOSIS — L03116 Cellulitis of left lower limb: Secondary | ICD-10-CM | POA: Diagnosis present

## 2020-01-11 DIAGNOSIS — T8141XA Infection following a procedure, superficial incisional surgical site, initial encounter: Secondary | ICD-10-CM | POA: Diagnosis present

## 2020-01-11 DIAGNOSIS — N1832 Chronic kidney disease, stage 3b: Secondary | ICD-10-CM | POA: Diagnosis present

## 2020-01-11 DIAGNOSIS — E785 Hyperlipidemia, unspecified: Secondary | ICD-10-CM | POA: Diagnosis present

## 2020-01-11 DIAGNOSIS — R531 Weakness: Secondary | ICD-10-CM | POA: Diagnosis present

## 2020-01-11 DIAGNOSIS — Z940 Kidney transplant status: Secondary | ICD-10-CM

## 2020-01-11 DIAGNOSIS — Z7952 Long term (current) use of systemic steroids: Secondary | ICD-10-CM

## 2020-01-11 LAB — CBC
HCT: 35.5 % — ABNORMAL LOW (ref 39.0–52.0)
Hemoglobin: 10.7 g/dL — ABNORMAL LOW (ref 13.0–17.0)
MCH: 25.7 pg — ABNORMAL LOW (ref 26.0–34.0)
MCHC: 30.1 g/dL (ref 30.0–36.0)
MCV: 85.1 fL (ref 80.0–100.0)
Platelets: 423 10*3/uL — ABNORMAL HIGH (ref 150–400)
RBC: 4.17 MIL/uL — ABNORMAL LOW (ref 4.22–5.81)
RDW: 18.8 % — ABNORMAL HIGH (ref 11.5–15.5)
WBC: 12.8 10*3/uL — ABNORMAL HIGH (ref 4.0–10.5)
nRBC: 0 % (ref 0.0–0.2)

## 2020-01-11 LAB — URINALYSIS, ROUTINE W REFLEX MICROSCOPIC
Bilirubin Urine: NEGATIVE
Glucose, UA: NEGATIVE mg/dL
Hgb urine dipstick: NEGATIVE
Ketones, ur: NEGATIVE mg/dL
Leukocytes,Ua: NEGATIVE
Nitrite: NEGATIVE
Protein, ur: 30 mg/dL — AB
Specific Gravity, Urine: 1.02 (ref 1.005–1.030)
pH: 5 (ref 5.0–8.0)

## 2020-01-11 LAB — SEDIMENTATION RATE: Sed Rate: 115 mm/hr — ABNORMAL HIGH (ref 0–16)

## 2020-01-11 LAB — C-REACTIVE PROTEIN: CRP: 39.8 mg/dL — ABNORMAL HIGH (ref <1.0)

## 2020-01-11 LAB — BASIC METABOLIC PANEL
Anion gap: 15 (ref 5–15)
BUN: 39 mg/dL — ABNORMAL HIGH (ref 6–20)
CO2: 22 mmol/L (ref 22–32)
Calcium: 9.5 mg/dL (ref 8.9–10.3)
Chloride: 97 mmol/L — ABNORMAL LOW (ref 98–111)
Creatinine, Ser: 3.04 mg/dL — ABNORMAL HIGH (ref 0.61–1.24)
GFR calc Af Amer: 25 mL/min — ABNORMAL LOW (ref >60)
GFR calc non Af Amer: 21 mL/min — ABNORMAL LOW (ref >60)
Glucose, Bld: 97 mg/dL (ref 70–99)
Potassium: 3.9 mmol/L (ref 3.5–5.1)
Sodium: 134 mmol/L — ABNORMAL LOW (ref 135–145)

## 2020-01-11 LAB — CBG MONITORING, ED
Glucose-Capillary: 121 mg/dL — ABNORMAL HIGH (ref 70–99)
Glucose-Capillary: 118 mg/dL — ABNORMAL HIGH (ref 70–99)

## 2020-01-11 LAB — LACTIC ACID, PLASMA: Lactic Acid, Venous: 1.2 mmol/L (ref 0.5–1.9)

## 2020-01-11 MED ORDER — SODIUM CHLORIDE 0.9 % IV SOLN
500.0000 mg | Freq: Two times a day (BID) | INTRAVENOUS | Status: DC
  Administered 2020-01-11: 500 mg via INTRAVENOUS
  Filled 2020-01-11 (×3): qty 0.5

## 2020-01-11 MED ORDER — SODIUM CHLORIDE 0.9 % IV SOLN: Freq: Once | INTRAVENOUS | Status: DC

## 2020-01-11 MED ORDER — METOPROLOL TARTRATE 25 MG PO TABS
25.0000 mg | ORAL_TABLET | Freq: Two times a day (BID) | ORAL | Status: DC
  Administered 2020-01-11: 25 mg via ORAL
  Filled 2020-01-11 (×2): qty 1

## 2020-01-11 MED ORDER — NICOTINE 14 MG/24HR TD PT24
14.0000 mg | MEDICATED_PATCH | Freq: Every day | TRANSDERMAL | Status: DC
  Administered 2020-01-11 – 2020-01-13 (×3): 14 mg via TRANSDERMAL
  Filled 2020-01-11 (×3): qty 1

## 2020-01-11 MED ORDER — SODIUM CHLORIDE 0.9% FLUSH
3.0000 mL | Freq: Once | INTRAVENOUS | Status: AC
  Administered 2020-01-11: 3 mL via INTRAVENOUS

## 2020-01-11 MED ORDER — TACROLIMUS 1 MG PO CAPS
3.0000 mg | ORAL_CAPSULE | Freq: Every morning | ORAL | Status: DC
  Administered 2020-01-12 – 2020-01-13 (×2): 3 mg via ORAL
  Filled 2020-01-11 (×4): qty 3

## 2020-01-11 MED ORDER — RANOLAZINE ER 500 MG PO TB12
500.0000 mg | ORAL_TABLET | Freq: Two times a day (BID) | ORAL | Status: DC
  Administered 2020-01-12 – 2020-01-13 (×4): 500 mg via ORAL
  Filled 2020-01-11 (×5): qty 1

## 2020-01-11 MED ORDER — LACTATED RINGERS IV BOLUS
1000.0000 mL | Freq: Once | INTRAVENOUS | Status: AC
  Administered 2020-01-11: 1000 mL via INTRAVENOUS

## 2020-01-11 MED ORDER — PREDNISONE 5 MG PO TABS
5.0000 mg | ORAL_TABLET | Freq: Every day | ORAL | Status: DC
  Administered 2020-01-12 – 2020-01-13 (×2): 5 mg via ORAL
  Filled 2020-01-11 (×3): qty 1

## 2020-01-11 MED ORDER — VANCOMYCIN VARIABLE DOSE PER UNSTABLE RENAL FUNCTION (PHARMACIST DOSING): Status: DC

## 2020-01-11 MED ORDER — MYCOPHENOLATE SODIUM 180 MG PO TBEC
360.0000 mg | DELAYED_RELEASE_TABLET | Freq: Two times a day (BID) | ORAL | Status: DC
  Administered 2020-01-11 – 2020-01-12 (×2): 360 mg via ORAL
  Filled 2020-01-11 (×2): qty 2

## 2020-01-11 MED ORDER — TACROLIMUS 1 MG PO CAPS
2.0000 mg | ORAL_CAPSULE | Freq: Every day | ORAL | Status: DC
  Administered 2020-01-12 (×2): 2 mg via ORAL
  Filled 2020-01-11 (×3): qty 2

## 2020-01-11 MED ORDER — TAMSULOSIN HCL 0.4 MG PO CAPS
0.4000 mg | ORAL_CAPSULE | Freq: Every day | ORAL | Status: DC
  Administered 2020-01-11 – 2020-01-12 (×2): 0.4 mg via ORAL
  Filled 2020-01-11 (×2): qty 1

## 2020-01-11 MED ORDER — ASPIRIN EC 81 MG PO TBEC
81.0000 mg | DELAYED_RELEASE_TABLET | Freq: Every day | ORAL | Status: DC
  Administered 2020-01-12 – 2020-01-13 (×2): 81 mg via ORAL
  Filled 2020-01-11 (×2): qty 1

## 2020-01-11 MED ORDER — HYDROMORPHONE HCL 1 MG/ML IJ SOLN: 0.5000 mg | INTRAMUSCULAR | Status: DC | PRN

## 2020-01-11 MED ORDER — PANTOPRAZOLE SODIUM 40 MG PO TBEC
40.0000 mg | DELAYED_RELEASE_TABLET | Freq: Every day | ORAL | Status: DC
  Administered 2020-01-12 – 2020-01-13 (×2): 40 mg via ORAL
  Filled 2020-01-11 (×2): qty 1

## 2020-01-11 MED ORDER — SODIUM BICARBONATE 650 MG PO TABS
1300.0000 mg | ORAL_TABLET | Freq: Two times a day (BID) | ORAL | Status: DC
  Administered 2020-01-12 – 2020-01-13 (×4): 1300 mg via ORAL
  Filled 2020-01-11 (×5): qty 2

## 2020-01-11 MED ORDER — INSULIN ASPART 100 UNIT/ML ~~LOC~~ SOLN
0.0000 [IU] | Freq: Three times a day (TID) | SUBCUTANEOUS | Status: DC
  Administered 2020-01-12: 2 [IU] via SUBCUTANEOUS
  Administered 2020-01-12 (×2): 1 [IU] via SUBCUTANEOUS
  Administered 2020-01-12: 2 [IU] via SUBCUTANEOUS

## 2020-01-11 MED ORDER — ISOSORBIDE MONONITRATE ER 60 MG PO TB24
60.0000 mg | ORAL_TABLET | Freq: Every day | ORAL | Status: DC
  Administered 2020-01-12 – 2020-01-13 (×2): 60 mg via ORAL
  Filled 2020-01-11 (×2): qty 1

## 2020-01-11 MED ORDER — RIVAROXABAN 2.5 MG PO TABS
2.5000 mg | ORAL_TABLET | Freq: Two times a day (BID) | ORAL | Status: DC
  Administered 2020-01-12 (×2): 2.5 mg via ORAL
  Filled 2020-01-11 (×3): qty 1

## 2020-01-11 MED ORDER — ATORVASTATIN CALCIUM 80 MG PO TABS
80.0000 mg | ORAL_TABLET | Freq: Every day | ORAL | Status: DC
  Administered 2020-01-11 – 2020-01-12 (×2): 80 mg via ORAL
  Filled 2020-01-11 (×2): qty 1

## 2020-01-11 MED ORDER — LACTATED RINGERS IV SOLN
INTRAVENOUS | Status: AC
  Administered 2020-01-11: 1000 mL via INTRAVENOUS

## 2020-01-11 MED ORDER — LACTATED RINGERS IV BOLUS: 1000.0000 mL | Freq: Once | INTRAVENOUS | Status: DC

## 2020-01-11 MED ORDER — HYDROMORPHONE HCL 1 MG/ML IJ SOLN
1.0000 mg | INTRAMUSCULAR | Status: DC | PRN
  Administered 2020-01-11 – 2020-01-13 (×8): 1 mg via INTRAVENOUS
  Filled 2020-01-11 (×8): qty 1

## 2020-01-11 MED ORDER — VANCOMYCIN HCL 1500 MG/300ML IV SOLN
1500.0000 mg | Freq: Once | INTRAVENOUS | Status: AC
  Administered 2020-01-11: 1500 mg via INTRAVENOUS
  Filled 2020-01-11: qty 300

## 2020-01-11 MED ORDER — HYDRALAZINE HCL 20 MG/ML IJ SOLN: 10.0000 mg | Freq: Four times a day (QID) | INTRAMUSCULAR | Status: DC | PRN

## 2020-01-11 MED ORDER — HYDROMORPHONE HCL 2 MG PO TABS
2.0000 mg | ORAL_TABLET | Freq: Once | ORAL | Status: AC
  Administered 2020-01-11: 2 mg via ORAL
  Filled 2020-01-11: qty 1

## 2020-01-11 MED ORDER — LACTATED RINGERS IV SOLN: INTRAVENOUS | Status: DC

## 2020-01-11 NOTE — ED Triage Notes (Signed)
Pt arrived by [facility] from [facility] for follow up appt. Pt had reported generalized weakness and left leg pain. Has wound vac to left leg. Pt received fentanyl 168mc iv pta.

## 2020-01-11 NOTE — ED Notes (Signed)
EDP at bedside. Pt's L leg wound vac dressing removed. Suction to wound vac fell off immediately and when I assessed his vac monitor, it was turned off. No suction was being applied to wound. Area around wound very tender to touch and swollen. Per EDP -- leave foam from vac intact and will have wound nurse address application of new dressing.

## 2020-01-11 NOTE — ED Provider Notes (Signed)
[facility] DEPARTMENT Provider Note   CSN: [id number] Arrival date & time: 01/11/20  1155     History Chief Complaint  Patient presents with  . Weakness    Theodore Arellano is a 59 y.o. male.  Presents to emergency department with chief complaint of left leg infection.  Patient reports "chronic wound, wound VAC to left lower leg wound.  Recently has been having worsening pain, swelling.  Podiatrist started Augmentin 3 days ago, patient reports taking this antibiotic but not having any improvement.  Denies any fevers, numbness, weakness, tingling, drainage.  Unsure if his wound VAC is operating correctly.  Pain is sharp, stabbing, worse with movement, no alleviating factors.  HPI     Past Medical History:  Diagnosis Date  . Anxiety   . Arthritis    "lower back" (11/28/2017)  . CAD (coronary artery disease)   . Chronic lower back pain   . H/O immunosuppressive therapy    chronic/notes 11/28/2017  . History of gout    "before kidney transport" (11/28/2017)  . Hyperlipidemia   . Hypertension   . Kidney disease    s/p kidney transplant 2011; "not on dialysis now" (11/28/2017)    Patient Active Problem List   Diagnosis Date Noted  . ESRD (end stage renal disease) ([place]) 01/06/2020  . AKI (acute kidney injury) ([place]) 11/08/2019  . COVID-19 virus infection 11/08/2019  . History of DVT (deep vein thrombosis) 11/08/2019  . Hx of CABG 09/04/2019  . Disorder of arteries and arterioles, unspecified ([place]) 07/21/2019  . Wound of left leg 07/21/2019  . Left leg pain 06/28/2019  . Abnormal ECG 06/05/2019  . Mobitz type I Wenckebach atrioventricular block 06/05/2019  . Unstable angina pectoris due to coronary arteriosclerosis ([place]) 05/30/2019  . Left ventricular dysfunction 01/08/2018  . Coronary artery disease involving native coronary artery of native heart with unstable angina pectoris ([place])   . Coronary artery disease due to lipid rich plaque   . Elevated  troponin   . Tobacco use 09/02/2017  . Chest pain 08/27/2016  . Renal transplant recipient 08/27/2016  . Essential hypertension 08/27/2016  . HLD (hyperlipidemia) 08/27/2016  . Type 2 diabetes mellitus with vascular disease ([place]) 08/27/2016  . Abnormal nuclear stress test 11/02/2015  . CKD (chronic kidney disease) 11/02/2015  . Current smoker 11/02/2015  . Dyslipidemia 11/02/2015  . Immunosuppression ([place]) 09/02/2014  . Rash 09/02/2014  . Left groin pain 06/24/2013  . Anemia 05/21/2012  . Leucopenia 05/21/2012  . Personal history of immunosupression therapy 05/21/2012    Past Surgical History:  Procedure Laterality Date  . ARTERIOVENOUS GRAFT PLACEMENT Right 04/2007   Archie Endo 02/01/2011  . AV FISTULA PLACEMENT Left 12/20/2005; 12/2006   Archie Endo 5/15/2012Marland Kitchen Archie Endo 02/13/2011  . CARDIAC CATHETERIZATION    . HD access procedures    . INGUINAL HERNIA REPAIR Left   . KIDNEY TRANSPLANT  2011  . LEFT HEART CATH AND CORONARY ANGIOGRAPHY N/A 11/29/2017   Procedure: LEFT HEART CATH AND CORONARY ANGIOGRAPHY;  Surgeon: Leonie Man, MD;  Location: [facility];  Service: Cardiovascular;  Laterality: N/A;  . LEFT HEART CATH AND CORONARY ANGIOGRAPHY N/A 06/01/2019   Procedure: LEFT HEART CATH AND CORONARY ANGIOGRAPHY;  Surgeon: Burnell Blanks, MD;  Location: [facility];  Service: Cardiovascular;  Laterality: N/A;  . LEFT HEART CATHETERIZATION WITH CORONARY ANGIOGRAM  03/11/2012   Procedure: LEFT HEART CATHETERIZATION WITH CORONARY ANGIOGRAM;  Surgeon: Jolaine Artist, MD;  Location: [facility] CATH  LAB;  Service: Cardiovascular;;  . RIGHT HEART CATHETERIZATION N/A 03/11/2012   Procedure: RIGHT HEART CATH;  Surgeon: Jolaine Artist, MD;  Location: [facility] CATH LAB;  Service: Cardiovascular;  Laterality: N/A;  . THROMBECTOMY Right 12/2007   Archie Endo 02/01/2011  . THROMBECTOMY / ARTERIOVENOUS GRAFT REVISION Left 12/2006   Archie Endo 02/13/2011  . THROMBECTOMY / ARTERIOVENOUS GRAFT REVISION  Right 07/2007; 10/2007;01/2008;   Archie Endo 01/31/2011; Archie Endo 02/01/2011; Archie Endo 01/31/2011  . THROMBECTOMY AND REVISION OF ARTERIOVENTOUS (AV) GORETEX  GRAFT  03/2007 X 2   /notes 02/01/2011       Family History  Problem Relation Age of Onset  . Hypertension Father   . Diabetes Mother     Social History   Tobacco Use  . Smoking status: Current Every Day Smoker    Packs/day: 0.50    Years: 40.00    Pack years: 20.00    Types: Cigarettes  . Smokeless tobacco: Never Used  Substance Use Topics  . Alcohol use: Yes    Alcohol/week: 12.0 standard drinks    Types: 12 Cans of beer per week  . Drug use: No    Home Medications Prior to Admission medications   Medication Sig Start Date End Date Taking? Authorizing Provider  amoxicillin-clavulanate (AUGMENTIN) 875-125 MG tablet Take 1 tablet by mouth 2 (two) times daily for 10 days. 01/08/20 01/18/20  Bronson Ing, DPM  aspirin EC 81 MG tablet Take 81 mg by mouth daily as needed.     [provider]  atorvastatin (LIPITOR) 80 MG tablet Take 80 mg by mouth at bedtime. 09/13/19   [provider]  blood glucose meter kit and supplies KIT Dispense based on patient and insurance preference. Use up to four times daily as directed. (FOR ICD-9 250.00, 250.01). 11/11/19   Nita Sells, MD  clopidogrel (PLAVIX) 75 MG tablet Take 75 mg by mouth daily. 09/25/19   [provider]  colchicine 0.6 MG tablet Take 0.6 mg by mouth daily as needed (for gout).     [provider]  gabapentin (NEURONTIN) 100 MG capsule Take 200 mg by mouth 3 (three) times daily as needed. 10/30/19   [provider]  HYDROmorphone (DILAUDID) 2 MG tablet Take 2 mg by mouth 3 (three) times daily as needed. 12/15/19   [provider]  isosorbide mononitrate (IMDUR) 30 MG 24 hr tablet Take 2 tablets (60 mg total) by mouth daily. 11/11/19   Nita Sells, MD  methylPREDNISolone (MEDROL DOSEPAK) 4 MG TBPK tablet TAKE 6  TABLETS ON DAY 1 AS DIRECTED ON PACKAGE AND DECREASE BY 1 TAB EACH DAY FOR A TOTAL OF 6 DAYS 11/30/19   [provider]  metoprolol tartrate (LOPRESSOR) 25 MG tablet Take 25 mg by mouth 2 (two) times daily. 10/30/19   [provider]  mycophenolate (MYFORTIC) 360 MG TBEC EC tablet Take 720 mg by mouth 2 (two) times daily.    [provider]  NARCAN 4 MG/0.1ML LIQD nasal spray kit SMARTSIG:1 Spray(s) Both Nares As Needed 12/02/19   [provider]  pantoprazole (PROTONIX) 40 MG tablet Take 40 mg by mouth daily. 11/02/19   [provider]  predniSONE (DELTASONE) 5 MG tablet Take 5 mg by mouth daily. 12/07/19   [provider]  ranolazine (RANEXA) 500 MG 12 hr tablet Take 500 mg by mouth 2 (two) times daily. 01/04/20   [provider]  sodium bicarbonate 650 MG tablet Take 1,300 mg by mouth 2 (two) times daily.  [provider]  tacrolimus (PROGRAF) 1 MG capsule Take 2-3 mg by mouth 2 (two) times daily. Take 29m in the morning and 238mat night, 12 hours apart.    [provider]  tamsulosin (FLOMAX) 0.4 MG CAPS capsule Take 0.4 mg by mouth at bedtime. 08/28/19   [provider]  Vitamin D, Ergocalciferol, (DRISDOL) 1.25 MG (50000 UNIT) CAPS capsule Take 50,000 Units by mouth once a week. 12/25/19   [provider]  XARELTO 2.5 MG TABS tablet Take 2.5 mg by mouth 2 (two) times daily. 09/04/19   [provider]    Allergies    Percocet [oxycodone-acetaminophen], Vicodin [hydrocodone-acetaminophen], Shellfish allergy, Chocolate flavor, Chocolate, and Tomato  Review of Systems   Review of Systems  Constitutional: Negative for chills and fever.  HENT: Negative for ear pain and sore throat.   Eyes: Negative for pain and visual disturbance.  Respiratory: Negative for cough and shortness of breath.   Cardiovascular: Negative for chest pain and palpitations.  Gastrointestinal: Negative for abdominal pain and  vomiting.  Genitourinary: Negative for dysuria and hematuria.  Musculoskeletal: Positive for arthralgias. Negative for back pain.  Skin: Negative for color change and rash.  Neurological: Negative for seizures and syncope.  All other systems reviewed and are negative.   Physical Exam Updated Vital Signs BP (!) 151/84   Pulse (!) 102   Temp (!) 100.8 F (38.2 C)   Resp 17   Ht _0  (1.702 m)   Wt 78.9 kg   SpO2 100%   BMI 27.25 kg/m   Physical Exam Vitals and nursing note reviewed.  Constitutional:      Appearance: He is well-developed.  HENT:     Head: Normocephalic and atraumatic.  Eyes:     Conjunctiva/sclera: Conjunctivae normal.  Cardiovascular:     Rate and Rhythm: Normal rate and regular rhythm.     Heart sounds: No murmur.  Pulmonary:     Effort: Pulmonary effort is normal. No respiratory distress.     Breath sounds: Normal breath sounds.  Abdominal:     Palpations: Abdomen is soft.     Tenderness: There is no abdominal tenderness.  Musculoskeletal:     Cervical back: Neck supple.     Comments: LLE: L lateral wound vac dressing intact except vacuum is detached and not on, generalized swelling, warmth but no induration or crepitus  Skin:    General: Skin is warm and dry.     Capillary Refill: Capillary refill takes less than 2 seconds.  Neurological:     General: No focal deficit present.     Mental Status: He is alert and oriented to person, place, and time.  Psychiatric:        Mood and Affect: Mood normal.     ED Results / Procedures / Treatments   Labs (all labs ordered are listed, but only abnormal results are displayed) Labs Reviewed  BASIC METABOLIC PANEL - Abnormal; Notable for the following components:      Result Value   Sodium 134 (*)    Chloride 97 (*)    BUN 39 (*)    Creatinine, Ser 3.04 (*)    GFR calc non Af Amer 21 (*)    GFR calc Af Amer 25 (*)    All other components within normal limits  CBC - Abnormal; Notable for the  following components:   WBC 12.8 (*)    RBC 4.17 (*)    Hemoglobin 10.7 (*)  HCT 35.5 (*)    MCH 25.7 (*)    RDW 18.8 (*)    Platelets 423 (*)    All other components within normal limits  URINALYSIS, ROUTINE W REFLEX MICROSCOPIC  CBG MONITORING, ED    EKG None  Radiology No results found.  Procedures Procedures (including critical care time)  Medications Ordered in ED Medications  sodium chloride flush (NS) 0.9 % injection 3 mL (has no administration in time range)    ED Course  I have reviewed the triage vital signs and the nursing notes.  Pertinent labs & imaging results that were available during my care of the patient were reviewed by me and considered in my medical decision making (see chart for details).    MDM Rules/Calculators/A&P                      59 year old male presenting to ER with concern for left leg infection.  Chronic wound, noted mild swelling and warmth.  Also borderline febrile here.  Concern for acute on chronic infection.  Given symptoms worsening despite oral antibiotics, believe he would benefit from admission for IV antibiotics and further monitoring.  Blood work was notable for AKI.  Patient is a renal transplant, I discussed with transplant team at [facility], Dr. Stann Mainland, can drop mycophenolate dosing in half while in hospital, check ultrasound, fluids, okay to admit at [facility].  Place patient on broad-spectrum antibiotics, started fluids, ordered ultrasound, consult hospitalist for admission.  Dr. Cyd Silence excepting to [facility] hospitalist service for further management.  Final Clinical Impression(s) / ED Diagnoses Final diagnoses:  Cellulitis, unspecified cellulitis site  AKI (acute kidney injury) [facility])    Rx / DC Orders ED Discharge Orders    None       Lucrezia Starch, MD 01/11/20 2342

## 2020-01-11 NOTE — H&P (Signed)
History and Physical    Theodore Arellano GBT:[id number] DOB: 13-Feb-1961 DOA: 01/11/2020  PCP: [name], MD  Patient coming from: Home   Chief Complaint:  Chief Complaint  Patient presents with  . 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  Patient explains that he wears a wound VAC for management of his left lower extremity vascular wound.  He unfortunately forgot to charge the wound VAC for an unspecified amount of time.  In the days that followed, patient began to develop worsening pain of the left lower extremity.  Patient describes the pain is severe in intensity, throbbing in quality, worse with weightbearing or movement of the left lower extremity.  Pain is nonradiating.  Over the following neck several weeks patient also began to develop redness swelling and warmth of the left lower extremity with increasing foul smell coming from the left leg.  As the patient symptoms continue to worsen he began to develop worsening generalized weakness and decreasing appetite.  Patient denies any associated fevers or sick contacts.  Patient denies any recent travel.  States that he is compliant with all of his medications.  Symptoms continue to worsen until he was seen by an outpatient provider several days ago where he was provided with a course of oral Augmentin.  Despite taking this course of oral Augmentin patient symptoms continue to  worsen.  Patient actually presented to [facility] emergency department for evaluation.  Upon evaluation in the emergency department, patient was found to have multiple SIRS criteria including leukocytosis, fever and tachycardia as well as foul smell coming from the left leg with surrounding redness and erythema concerning for sepsis secondary to developing left lower extremity wound infection and cellulitis.  Patient was initiated on broad-spectrum intravenous antibiotic therapy, initiated on intravenous fluids and the hospitalist group was then called to assess patient for admission to hospital.     Review of Systems: A 10-system review of systems has been performed and all systems are negative with the exception of what is listed in the HPI.   Past Medical History:  Diagnosis Date  . Anxiety   . Arthritis    "lower back" (11/28/2017)  . CAD (coronary artery disease)   . Chronic lower back pain   . H/O immunosuppressive therapy    chronic/notes 11/28/2017  . History of gout    "before kidney transport" (11/28/2017)  . Hyperlipidemia   . Hypertension   . Kidney disease    s/p kidney transplant 2011; "not on dialysis now" (11/28/2017)    Past Surgical History:  Procedure Laterality Date  . ARTERIOVENOUS GRAFT PLACEMENT Right 04/2007   Archie Endo 02/01/2011  . AV FISTULA PLACEMENT Left 12/20/2005; 12/2006   Archie Endo 5/15/2012Marland Kitchen Archie Endo 02/13/2011  . CARDIAC CATHETERIZATION    . HD access procedures    . INGUINAL HERNIA REPAIR Left   . KIDNEY TRANSPLANT  2011  . LEFT HEART CATH AND CORONARY ANGIOGRAPHY N/A 11/29/2017   Procedure: LEFT  HEART CATH AND CORONARY ANGIOGRAPHY;  Surgeon: Leonie Man, MD;  Location: [place] [facility];  Service: Cardiovascular;  Laterality: N/A;  . LEFT HEART CATH AND CORONARY ANGIOGRAPHY N/A 06/01/2019   Procedure: LEFT HEART CATH AND CORONARY ANGIOGRAPHY;  Surgeon: Burnell Blanks, MD;  Location: [place] [facility];  Service: Cardiovascular;   Laterality: N/A;  . LEFT HEART CATHETERIZATION WITH CORONARY ANGIOGRAM  03/11/2012   Procedure: LEFT HEART CATHETERIZATION WITH CORONARY ANGIOGRAM;  Surgeon: Jolaine Artist, MD;  Location: [facility] CATH LAB;  Service: Cardiovascular;;  . RIGHT HEART CATHETERIZATION N/A 03/11/2012   Procedure: RIGHT HEART CATH;  Surgeon: Jolaine Artist, MD;  Location: [facility] CATH LAB;  Service: Cardiovascular;  Laterality: N/A;  . THROMBECTOMY Right 12/2007   Archie Endo 02/01/2011  . THROMBECTOMY / ARTERIOVENOUS GRAFT REVISION Left 12/2006   Archie Endo 02/13/2011  . THROMBECTOMY / ARTERIOVENOUS GRAFT REVISION Right 07/2007; 10/2007;01/2008;   Archie Endo 01/31/2011; Archie Endo 02/01/2011; Archie Endo 01/31/2011  . THROMBECTOMY AND REVISION OF ARTERIOVENTOUS (AV) GORETEX  GRAFT  03/2007 X 2   /notes 02/01/2011     reports that he has been smoking cigarettes. He has a 20.00 pack-year smoking history. He has never used smokeless tobacco. He reports current alcohol use of about 12.0 standard drinks of alcohol per week. He reports that he does not use drugs.  Allergies  Allergen Reactions  . Percocet [Oxycodone-Acetaminophen] Hives and Itching  . Vicodin [Hydrocodone-Acetaminophen] Hives and Itching  . Shellfish Allergy Hives  . Chocolate Flavor Other (See Comments)    Unknown reaction  . Chocolate Hives  . Tomato Hives    Family History  Problem Relation Age of Onset  . Hypertension Father   . Diabetes Mother      Prior to Admission medications   Medication Sig Start Date End Date Taking? Authorizing Provider  amoxicillin-clavulanate (AUGMENTIN) 875-125 MG tablet Take 1 tablet by mouth 2 (two) times daily for 10 days. 01/08/20 01/18/20  Bronson Ing, DPM  aspirin EC 81 MG tablet Take 81 mg by mouth daily as needed.     [provider]  atorvastatin (LIPITOR) 80 MG tablet Take 80 mg by mouth at bedtime. 09/13/19   [provider]  blood glucose meter kit and supplies KIT Dispense based on patient and insurance  preference. Use up to four times daily as directed. (FOR ICD-9 250.00, 250.01). 11/11/19   Nita Sells, MD  clopidogrel (PLAVIX) 75 MG tablet Take 75 mg by mouth daily. 09/25/19   [provider]  colchicine 0.6 MG tablet Take 0.6 mg by mouth daily as needed (for gout).     [provider]  gabapentin (NEURONTIN) 100 MG capsule Take 200 mg by mouth 3 (three) times daily as needed. 10/30/19   [provider]  HYDROmorphone (DILAUDID) 2 MG tablet Take 2 mg by mouth 3 (three) times daily as needed. 12/15/19   [provider]  isosorbide mononitrate (IMDUR) 30 MG 24 hr tablet Take 2 tablets (60 mg total) by mouth daily. 11/11/19   Nita Sells, MD  metoprolol tartrate (LOPRESSOR) 25 MG tablet Take 25 mg by mouth 2 (two) times daily. 10/30/19   [provider]  mycophenolate (MYFORTIC) 360 MG TBEC EC tablet Take 720 mg by mouth 2 (two) times daily.    [provider]  NARCAN 4 MG/0.1ML LIQD nasal spray kit Place 1 spray into the nose once.  12/02/19   [provider]  pantoprazole (PROTONIX) 40 MG tablet Take 40 mg by mouth  daily. 11/02/19   [provider]  predniSONE (DELTASONE) 5 MG tablet Take 5 mg by mouth daily. 12/07/19   [provider]  ranolazine (RANEXA) 500 MG 12 hr tablet Take 500 mg by mouth 2 (two) times daily. 01/04/20   [provider]  sodium bicarbonate 650 MG tablet Take 1,300 mg by mouth 2 (two) times daily.    [provider]  tacrolimus (PROGRAF) 1 MG capsule Take 2-3 mg by mouth 2 (two) times daily. Take '3mg'$  in the morning and '2mg'$  at night, 12 hours apart.    [provider]  tamsulosin (FLOMAX) 0.4 MG CAPS capsule Take 0.4 mg by mouth at bedtime. 08/28/19   [provider]  Vitamin D, Ergocalciferol, (DRISDOL) 1.25 MG (50000 UNIT) CAPS capsule Take 50,000 Units by mouth once a week. 12/25/19   [provider]  XARELTO 2.5 MG TABS tablet Take 2.5 mg by  mouth 2 (two) times daily. 09/04/19   [provider]    Physical Exam: Vitals:   01/11/20 1801 01/11/20 1835 01/11/20 1900 01/11/20 1930  BP: (!) 166/92 (!) 169/100 (!) 173/88 (!) 181/104  Pulse:      Resp: (!) 24 (!) '21 17 19  '$ Temp:      TempSrc:      SpO2:  97%  97%  Weight:      Height:        Constitutional: Lethargic but arousable, oriented x3, patient is in distress due to left leg pain. Skin: Extremely poor skin turgor noted.  Dry scaly skin noted of the left lower extremity.  Circumferential deep tunneling wound of the left lateral lower extremity noted.  Associated foul smell without significant drainage.  There is surrounding redness induration and tenderness around the wound as well.   Eyes: Pupils are equally reactive to light.  No evidence of scleral icterus or conjunctival pallor.  ENMT: Extremely dry mucous membranes.  Posterior pharynx clear of any exudate or lesions. Normal dentition.   Neck: normal, supple, no masses, no thyromegaly Respiratory: clear to auscultation bilaterally, no wheezing no org, no crackles. Normal respiratory effort. No accessory muscle use.  Cardiovascular: Tachycardic rate with regular rhythm.  No murmurs / rubs / gallops. No extremity edema. 2+ pedal pulses. No carotid bruits.  Back:   Nontender without crepitus or deformity. Abdomen: Abdomen is soft and nontender.  No evidence of intra-abdominal masses.  Positive bowel sounds noted in all quadrants.   Musculoskeletal: Significant tenderness of the left lower extremity surrounding the left lower extremity wound as well as the distal left lower extremity.  There is significant edema erythema and tenderness with further details noted in skin examination.  No joint deformity upper and lower extremities. Good ROM, no contractures. Normal muscle tone.  Neurologic: CN 2-12 grossly intact. Sensation intact, strength noted to be 5 out of 5 in all 4 extremities.  Patient is following all commands.   Patient is responsive to verbal stimuli.   Psychiatric: Patient presents as a normal mood with appropriate affect.  Patient seems to possess insight as to theircurrent situation.     Labs on Admission: I have personally reviewed following labs and imaging studies -   CBC: Recent Labs  Lab 01/11/20 1233  WBC 12.8*  HGB 10.7*  HCT 35.5*  MCV 85.1  PLT 644*   Basic Metabolic Panel: Recent Labs  Lab 01/11/20 1233  NA 134*  K 3.9  CL 97*  CO2 22  GLUCOSE 97  BUN 39*  CREATININE 3.04*  CALCIUM 9.5   GFR: Estimated Creatinine Clearance: 24.5 mL/min (A) (by C-G formula based on SCr of 3.04 mg/dL (H)). Liver Function Tests: No results for input(s): AST, ALT, ALKPHOS, BILITOT, PROT, ALBUMIN in the last 168 hours. No results for input(s): LIPASE, AMYLASE in the last 168 hours. No results for input(s): AMMONIA in the last 168 hours. Coagulation Profile: No results for input(s): INR, PROTIME in the last 168 hours. Cardiac Enzymes: No results for input(s): CKTOTAL, CKMB, CKMBINDEX, TROPONINI in the last 168 hours. BNP (last 3 results) No results for input(s): PROBNP in the last 8760 hours. HbA1C: No results for input(s): HGBA1C in the last 72 hours. CBG: Recent Labs  Lab 01/11/20 1742  GLUCAP 121*   Lipid Profile: No results for input(s): CHOL, HDL, LDLCALC, TRIG, CHOLHDL, LDLDIRECT in the last 72 hours. Thyroid Function Tests: No results for input(s): TSH, T4TOTAL, FREET4, T3FREE, THYROIDAB in the last 72 hours. Anemia Panel: No results for input(s): VITAMINB12, FOLATE, FERRITIN, TIBC, IRON, RETICCTPCT in the last 72 hours. Urine analysis:    Component Value Date/Time   COLORURINE YELLOW 11/09/2019 0530   APPEARANCEUR CLEAR 11/09/2019 0530   LABSPEC 1.020 11/09/2019 0530   PHURINE 5.0 11/09/2019 0530   GLUCOSEU NEGATIVE 11/09/2019 0530   HGBUR NEGATIVE 11/09/2019 0530   BILIRUBINUR NEGATIVE 11/09/2019 0530   KETONESUR NEGATIVE 11/09/2019 0530   PROTEINUR 30 (A)  11/09/2019 0530   UROBILINOGEN 1.0 01/07/2014 0015   NITRITE NEGATIVE 11/09/2019 0530   LEUKOCYTESUR NEGATIVE 11/09/2019 0530    Radiological Exams on Admission: DG Chest 2 View  Result Date: 01/11/2020 CLINICAL DATA:  Fever, lower extremity infection EXAM: CHEST - 2 VIEW COMPARISON:  11/08/2019 FINDINGS: Frontal and lateral views of the chest demonstrate stable postoperative changes from CABG. Cardiac silhouette is enlarged but stable. No airspace disease, effusion, or pneumothorax. IMPRESSION: 1. No acute intrathoracic process. Electronically Signed   By: Randa Ngo M.D.   On: 01/11/2020 18:36   DG Tibia/Fibula Left  Result Date: 01/11/2020 CLINICAL DATA:  Left lower leg wound concern for deep space infection EXAM: LEFT TIBIA AND FIBULA - 2 VIEW COMPARISON:  Radiograph 12/29/2019 FINDINGS: There is a large, saucerized ulceration along the lateral aspect of the distal left calf. There is a separate focus of subcutaneous gas distal to this site along the lateral aspect of the lower leg (see annotated image). No other foci of soft tissue gas or radiopaque foreign body. Extensive vascular calcium is noted in the soft tissues. No subjacent areas of periostitis, osseous destructive change, or other features to suggest subjacent osteomyelitis. With mild degenerative changes noted at the knee and ankle. IMPRESSION: Ulcer along the lateral aspect of the distal left calf. There is a separate focus of subcutaneous gas along the lateral aspect of the lower leg. No radiographic evidence of osteomyelitis or radiopaque foreign body. Electronically Signed   By: Lovena Le M.D.   On: 01/11/2020 19:33    EKG: Personally reviewed.  Rhythm is normal sinus rhythm with a heart rate of 85 bpm.  Notable significant Q waves noted in the inferior leads.  No dynamic ST segment changes appreciated.  Assessment/Plan Active Problems:  Sepsis with associated acute organ dysfunction secondary to cellulitis and wound  infection.   Significant foul smell of the left lower extremity wound with surrounding cellulitis and induration concerning for source of infection  Patient exhibits multiple SIRS criteria with associated acute kidney injury concerning for developing sepsis.  Significant concern for rapid clinical  decline due to immunocompromise state due to multiple immunomodulator agents  Patient been placed on broad-spectrum intravenous antibiotic therapy with meropenem and vancomycin can be deescalated once cultures have resulted.  Blood cultures have been obtained.  Wound culture has been obtained at the bedside.  Urine culture has been ordered.  He is being hydrated with intravenous isotonic fluids with lactated Ringer's  Patient closely for clinical improvement.    Acute kidney injury ([place])   High risk secondary to history of left renal transplant.  Hydration with intravenous isotonic fluids  Check input and output monitoring  On renal function and electrolytes with serial chemistries     Cellulitis of left lower extremity   Please see plan above.      Coronary artery disease involving native coronary artery of native heart without angina pectoris  Patient is currently chest pain-free  On her inpatient on telemetry  Continuing home regimen of aspirin in addition to home regimen of Xarelto being used for patient's known history of peripheral vascular disease  Status post CABG and August 2020    DM (diabetes mellitus) type II, controlled, with peripheral vascular disorder ([place])   Accu-Cheks before every meal and nightly with slight scale insulin  Recent hemoglobin A1c performed in February found to be 6.5 suggestive of reasonable control    Chronic pain syndrome   Patient currently receiving regimen of outpatient oral Dilaudid confirmed via controlled substance database.  Providing patient with as needed intravenous Dilaudid while hospitalized due to substantial associated  pain of the left lower extremity.        Code Status:  Full code Family Communication: Deferred  Disposition Plan: Patient is anticipated to be discharged to home with home health services once patient has met maximum benefit from current hospitalization.   Consults called: Case discussed with Dr. Stann Mainland with Renal transplant at [facility] by ED Provider. Wound care consult placed for the AM. Admission status: Patient will be admitted to Inpatient and is anticipated to remain in the hospital for greater than 2 midnights.   Vernelle Emerald MD [facility] Pager [phone]  If 7PM-7AM, please contact night-coverage [email or web address] Use universal [place] password for that web site. If you do not have the password, please call the hospital operator.  01/11/2020, 8:43 PM

## 2020-01-11 NOTE — ED Notes (Signed)
Will administer meropenem once vanco infusion has completed.

## 2020-01-11 NOTE — Progress Notes (Addendum)
Pharmacy Antibiotic Note  Theodore Arellano is a 59 y.o. male admitted on 01/11/2020 with cellulitis, on augmentin PTA.  Pharmacy has been consulted for vancomycin dosing.  AKI w/hx of renal transplant.    Plan: Vancomycin 1500 mg IV x 1, then variable dosing based on renal function Monitor renal function, clinical progression and LOT Vancomycin random level as needed  Height: 5\' 7"  (170.2 cm) Weight: 78.9 kg (174 lb) IBW/kg (Calculated) : 66.1  Temp (24hrs), Avg:99.7 F (37.6 C), Min:98.4 F (36.9 C), Max:100.8 F (38.2 C)  Recent Labs  Lab 01/11/20 1233  WBC 12.8*  CREATININE 3.04*    Estimated Creatinine Clearance: 24.5 mL/min (A) (by C-G formula based on SCr of 3.04 mg/dL (H)).    Allergies  Allergen Reactions  . Percocet [Oxycodone-Acetaminophen] Hives and Itching  . Vicodin [Hydrocodone-Acetaminophen] Hives and Itching  . Shellfish Allergy Hives  . Chocolate Flavor Other (See Comments)    Unknown reaction  . Chocolate Hives  . Tomato Hives    Addendum: Consulted to add meropenem for sepsis Meropenem 500mg  IV every 12 hours  Bertis Ruddy, PharmD Clinical Pharmacist ED Pharmacist Phone # [phone] 01/11/2020 7:40 PM

## 2020-01-11 NOTE — ED Notes (Signed)
Pt name called for vitals, no response 

## 2020-01-11 NOTE — ED Notes (Signed)
Pt unable to urinate at this time.  

## 2020-01-11 NOTE — ED Notes (Signed)
Wound repacked with saline soaked 4x4 and gauze wrap was placed around wound per attending. Pt to be evaluated by wound specialist tomorrow for wound vac application.

## 2020-01-12 ENCOUNTER — Inpatient Hospital Stay (HOSPITAL_COMMUNITY): Payer: Medicare Other

## 2020-01-12 DIAGNOSIS — I739 Peripheral vascular disease, unspecified: Secondary | ICD-10-CM

## 2020-01-12 DIAGNOSIS — L0390 Cellulitis, unspecified: Secondary | ICD-10-CM

## 2020-01-12 DIAGNOSIS — M86652 Other chronic osteomyelitis, left thigh: Secondary | ICD-10-CM

## 2020-01-12 LAB — CBC WITH DIFFERENTIAL/PLATELET
Abs Immature Granulocytes: 0.17 10*3/uL — ABNORMAL HIGH (ref 0.00–0.07)
Basophils Absolute: 0 10*3/uL (ref 0.0–0.1)
Basophils Relative: 0 %
Eosinophils Absolute: 0.3 10*3/uL (ref 0.0–0.5)
Eosinophils Relative: 3 %
HCT: 29.5 % — ABNORMAL LOW (ref 39.0–52.0)
Hemoglobin: 9.1 g/dL — ABNORMAL LOW (ref 13.0–17.0)
Immature Granulocytes: 1 %
Lymphocytes Relative: 7 %
Lymphs Abs: 0.9 10*3/uL (ref 0.7–4.0)
MCH: 25.6 pg — ABNORMAL LOW (ref 26.0–34.0)
MCHC: 30.8 g/dL (ref 30.0–36.0)
MCV: 83.1 fL (ref 80.0–100.0)
Monocytes Absolute: 1.1 10*3/uL — ABNORMAL HIGH (ref 0.1–1.0)
Monocytes Relative: 9 %
Neutro Abs: 9.6 10*3/uL — ABNORMAL HIGH (ref 1.7–7.7)
Neutrophils Relative %: 80 %
Platelets: 419 10*3/uL — ABNORMAL HIGH (ref 150–400)
RBC: 3.55 MIL/uL — ABNORMAL LOW (ref 4.22–5.81)
RDW: 18.2 % — ABNORMAL HIGH (ref 11.5–15.5)
WBC: 12 10*3/uL — ABNORMAL HIGH (ref 4.0–10.5)
nRBC: 0 % (ref 0.0–0.2)

## 2020-01-12 LAB — COMPREHENSIVE METABOLIC PANEL
ALT: 8 U/L (ref 0–44)
AST: 13 U/L — ABNORMAL LOW (ref 15–41)
Albumin: 2.2 g/dL — ABNORMAL LOW (ref 3.5–5.0)
Alkaline Phosphatase: 59 U/L (ref 38–126)
Anion gap: 10 (ref 5–15)
BUN: 25 mg/dL — ABNORMAL HIGH (ref 6–20)
CO2: 23 mmol/L (ref 22–32)
Calcium: 9.2 mg/dL (ref 8.9–10.3)
Chloride: 102 mmol/L (ref 98–111)
Creatinine, Ser: 1.74 mg/dL — ABNORMAL HIGH (ref 0.61–1.24)
GFR calc Af Amer: 49 mL/min — ABNORMAL LOW (ref >60)
GFR calc non Af Amer: 42 mL/min — ABNORMAL LOW (ref >60)
Glucose, Bld: 131 mg/dL — ABNORMAL HIGH (ref 70–99)
Potassium: 3.6 mmol/L (ref 3.5–5.1)
Sodium: 135 mmol/L (ref 135–145)
Total Bilirubin: 1.1 mg/dL (ref 0.3–1.2)
Total Protein: 6.6 g/dL (ref 6.5–8.1)

## 2020-01-12 LAB — SARS CORONAVIRUS 2 (TAT 6-24 HRS): SARS Coronavirus 2: NEGATIVE

## 2020-01-12 LAB — PROTIME-INR
INR: 1.6 — ABNORMAL HIGH (ref 0.8–1.2)
Prothrombin Time: 18.9 seconds — ABNORMAL HIGH (ref 11.4–15.2)

## 2020-01-12 LAB — GLUCOSE, CAPILLARY
Glucose-Capillary: 132 mg/dL — ABNORMAL HIGH (ref 70–99)
Glucose-Capillary: 140 mg/dL — ABNORMAL HIGH (ref 70–99)
Glucose-Capillary: 157 mg/dL — ABNORMAL HIGH (ref 70–99)
Glucose-Capillary: 184 mg/dL — ABNORMAL HIGH (ref 70–99)

## 2020-01-12 LAB — CORTISOL-AM, BLOOD: Cortisol - AM: 10.9 ug/dL (ref 6.7–22.6)

## 2020-01-12 LAB — PROCALCITONIN: Procalcitonin: 3.48 ng/mL

## 2020-01-12 LAB — MRSA PCR SCREENING: MRSA by PCR: NEGATIVE

## 2020-01-12 MED ORDER — VANCOMYCIN HCL IN DEXTROSE 1-5 GM/200ML-% IV SOLN: 1000.0000 mg | Freq: Once | INTRAVENOUS | Status: DC

## 2020-01-12 MED ORDER — METOPROLOL TARTRATE 25 MG PO TABS
12.5000 mg | ORAL_TABLET | Freq: Two times a day (BID) | ORAL | Status: DC
  Administered 2020-01-12 – 2020-01-13 (×3): 12.5 mg via ORAL
  Filled 2020-01-12 (×3): qty 1

## 2020-01-12 MED ORDER — ENOXAPARIN SODIUM 80 MG/0.8ML ~~LOC~~ SOLN
80.0000 mg | Freq: Two times a day (BID) | SUBCUTANEOUS | Status: DC
  Administered 2020-01-12 – 2020-01-13 (×2): 80 mg via SUBCUTANEOUS
  Filled 2020-01-12 (×4): qty 0.8

## 2020-01-12 MED ORDER — MYCOPHENOLATE SODIUM 180 MG PO TBEC
360.0000 mg | DELAYED_RELEASE_TABLET | Freq: Once | ORAL | Status: AC
  Administered 2020-01-12: 360 mg via ORAL
  Filled 2020-01-12: qty 2

## 2020-01-12 MED ORDER — PIPERACILLIN-TAZOBACTAM 3.375 G IVPB
3.3750 g | Freq: Three times a day (TID) | INTRAVENOUS | Status: DC
  Administered 2020-01-12 – 2020-01-13 (×3): 3.375 g via INTRAVENOUS
  Filled 2020-01-12 (×3): qty 50

## 2020-01-12 MED ORDER — SODIUM CHLORIDE 0.9 % IV SOLN
1.0000 g | Freq: Two times a day (BID) | INTRAVENOUS | Status: DC
  Administered 2020-01-12: 1 g via INTRAVENOUS
  Filled 2020-01-12 (×2): qty 1

## 2020-01-12 MED ORDER — MYCOPHENOLATE SODIUM 180 MG PO TBEC
720.0000 mg | DELAYED_RELEASE_TABLET | Freq: Two times a day (BID) | ORAL | Status: DC
  Administered 2020-01-12 – 2020-01-13 (×2): 720 mg via ORAL
  Filled 2020-01-12 (×3): qty 4

## 2020-01-12 MED ORDER — RIVAROXABAN 2.5 MG PO TABS
2.5000 mg | ORAL_TABLET | Freq: Two times a day (BID) | ORAL | Status: DC
  Filled 2020-01-12: qty 1

## 2020-01-12 NOTE — Consult Note (Signed)
WOC Nurse Consult Note: Patient receiving care in [facility] 5N19. Reason for Consult: LLE wound Wound type: non-healing surgical wound of many months duration Pressure Injury POA: Yes/No/NA Measurement: 4.2 cm x 2.2 cm x 2.3 cm with a 4.2 cm tunnel at 6 o'clock tracking down towards his lateral malleolus.  With the slightest pressure below the inferior wound margin a large volume of tan/red purulent appearing drainage poured out.  More could be expressed from the soft tissues inferiorly, however, the patient is in pain without pressure to the area. Wound bed: red, clean Drainage (amount, consistency, odor) thick, red/tan drainage Periwound: edematous, hard, erythematous.  GAS in tissues per imaging report from yesterday. Dressing procedure/placement/frequency: I have placed a foam dressing over the area.  I have spoken to Dr. Marlowe Sax about my findings.  He will discuss options with the patient. If the patient remains here I recommend a surgical consult.  Thank you for the consult.  Discussed plan of care with the patient and bedside nurse.  [facility] nurse will not follow at this time.  Please re-consult the [place] team if needed.  Val Riles, RN, MSN, CWOCN, CNS-BC, pager [phone]

## 2020-01-12 NOTE — Progress Notes (Signed)
[place] for Lovenox (while Xarelto on hold) Indication: PVD, graft patency, hx DVT  Allergies  Allergen Reactions  . Percocet [Oxycodone-Acetaminophen] Hives and Itching  . Vicodin [Hydrocodone-Acetaminophen] Hives and Itching  . Shellfish Allergy Hives  . Chocolate Flavor Other (See Comments)    Unknown reaction  . Chocolate Hives  . Tomato Hives    Patient Measurements: Height: 5\' 7"  (170.2 cm) Weight: 78.9 kg (174 lb) IBW/kg (Calculated) : 66.1  Vital Signs: Temp: 98.9 F (37.2 C) (04/13 0804) Temp Source: Oral (04/13 0804) BP: 151/90 (04/13 0804) Pulse Rate: 89 (04/13 0804)  Labs: Recent Labs    01/11/20 1233 01/12/20 0656  HGB 10.7* 9.1*  HCT 35.5* 29.5*  PLT 423* 419*  LABPROT  --  18.9*  INR  --  1.6*  CREATININE 3.04* 1.74*    Estimated Creatinine Clearance: 42.7 mL/min (A) (by C-G formula based on SCr of 1.74 mg/dL (H)).   Medical History: Past Medical History:  Diagnosis Date  . Anxiety   . Arthritis    "lower back" (11/28/2017)  . CAD (coronary artery disease)   . Chronic lower back pain   . H/O immunosuppressive therapy    chronic/notes 11/28/2017  . History of gout    "before kidney transport" (11/28/2017)  . Hyperlipidemia   . Hypertension   . Kidney disease    s/p kidney transplant 2011; "not on dialysis now" (11/28/2017)    Assessment: 38 YOM on Xarelto PTA for hx PVD, graft patency, prior DVT - now with plans to hold for possible arteriography later this week for VVS. Pharmacy consulted to bridge with full-dose Lovenox.   Goal of Therapy:  Anti-Xa level 0.6-1 units/ml 4hrs after LMWH dose given Monitor platelets by anticoagulation protocol: Yes   Plan:  D/c Xarelto Start Lovenox 80 mg SQ bid Will follow-up plans for surgery date/time and plan to hold 12 hours prior to the procedure per VVS  Thank you for allowing pharmacy to be a part of this patient's care.  Alycia Rossetti, PharmD,  BCPS Clinical Pharmacist Clinical phone for 01/12/2020: [phone] 01/12/2020 1:41 PM   **Pharmacist phone directory can now be found on [email or web address] ([email or web address]).  Listed under [place].

## 2020-01-12 NOTE — Plan of Care (Signed)
  Problem: Clinical Measurements: Goal: Ability to avoid or minimize complications of infection will improve Outcome: Progressing   Problem: Skin Integrity: Goal: Skin integrity will improve Outcome: Progressing   Problem: Education: Goal: Knowledge of General Education information will improve Description: Including pain rating scale, medication(s)/side effects and non-pharmacologic comfort measures Outcome: Progressing   Problem: Clinical Measurements: Goal: Ability to maintain clinical measurements within normal limits will improve Outcome: Progressing   Problem: Activity: Goal: Risk for activity intolerance will decrease Outcome: Progressing   Problem: Nutrition: Goal: Adequate nutrition will be maintained Outcome: Progressing   Problem: Coping: Goal: Level of anxiety will decrease Outcome: Progressing   Problem: Elimination: Goal: Will not experience complications related to bowel motility Outcome: Progressing   Problem: Pain Managment: Goal: General experience of comfort will improve Outcome: Progressing   Problem: Safety: Goal: Ability to remain free from injury will improve Outcome: Progressing   Problem: Skin Integrity: Goal: Risk for impaired skin integrity will decrease Outcome: Progressing

## 2020-01-12 NOTE — ED Notes (Signed)
Attempted to call report x 1  

## 2020-01-12 NOTE — Consult Note (Addendum)
REASON FOR CONSULT:    Osteomyelitis left leg.  The consult is requested by Dr. 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  Plain x-ray does not show evidence of osteomyelitis.  However given the chronic nature of the wound certainly I would be concerned that this is in the differential diagnosis.  He has undergone previous left lower extremity bypass in [place].  He has a fairly brisk posterior tibial signal with the Doppler.  I will obtain a graft duplex on the left and also ABIs.  As he may not tolerate ankle pressures because of the wound we will at least get toe pressures on the left to help predict his chances for healing from a vascular standpoint.  If his studies are significantly abnormal we would have to consider arteriography.  Given that he is undergone renal transplant this would likely have to be done with CO2 and minimal contrast.  I will make further recommendations pending the results of his noninvasive studies.  Given the chronic nature of the wound I think osteomyelitis is in the differential diagnosis and I will order an MRI to further evaluate for osteo.   ID: He is on vancomycin and Zosyn.  ANTICOAGULATION: He is on Xarelto.  The patient is not sure why he is on Xarelto and I cannot determine from the records why he is on Xarelto.  Has since he may require arteriography I will hold his Xarelto and start him on full dose Lovenox per pharmacy.  Deitra Mayo, MD Office: [phone]   HPI:   Theodore Arellano is a pleasant 59 y.o. male, who is a poor historian so some of his history is obtained from the records.  He underwent coronary revascularization in [place] in August 2020.  He is also status post left renal transplantation.  He had developed a wound on his left leg and underwent a left femoral endarterectomy  in October 2020 followed by a left femoral to posterior tibial artery bypass in October 2020.  I believe this was in [place].  He has had a chronic wound on the lateral aspect of his left leg.  He also had a recent COVID-19 infection in February 2021.  He apparently had a VAC on the left leg wound but forgot to change it and presented with worsening pain in the left leg.  On my history the patient thinks that he has had this wound since October.  His risk factors for peripheral vascular disease include diabetes, hypertension, hypercholesterolemia, and tobacco use.  He denies any family history of premature cardiovascular disease.  He does admit to bilateral lower extremity claudication.  His symptoms are equal on both sides.  He can walk 50 yards before experiencing symptoms.  He denies any history of rest pain or nonhealing ulcers.  He tells me that he is on Xarelto but does not know why.  Past Medical History:  Diagnosis Date  . Anxiety   . Arthritis    "lower back" (11/28/2017)  . CAD (coronary artery disease)   . Chronic lower back pain   . H/O immunosuppressive therapy    chronic/notes 11/28/2017  . History of gout    "before kidney transport" (11/28/2017)  . Hyperlipidemia   . Hypertension   . Kidney disease    s/p kidney transplant 2011; "not on dialysis now" (11/28/2017)  Family History  Problem Relation Age of Onset  . Hypertension Father   . Diabetes Mother     SOCIAL HISTORY: He smokes half a pack per day of cigarettes and has been smoking since high school. Social History   Socioeconomic History  . Marital status: Single    Spouse name: Not on file  . Number of children: Not on file  . Years of education: Not on file  . Highest education level: Not on file  Occupational History  . Occupation: Unemployed Administrator  Tobacco Use  . Smoking status: Current Every Day Smoker    Packs/day: 0.50    Years: 40.00    Pack years: 20.00    Types: Cigarettes  .  Smokeless tobacco: Never Used  Substance and Sexual Activity  . Alcohol use: Yes    Alcohol/week: 12.0 standard drinks    Types: 12 Cans of beer per week  . Drug use: No  . Sexual activity: Never    Birth control/protection: None  Other Topics Concern  . Not on file  Social History Narrative  . Not on file   Social Determinants of Health   Financial Resource Strain:   . Difficulty of Paying Living Expenses:   Food Insecurity:   . Worried About Charity fundraiser in the Last Year:   . Arboriculturist in the Last Year:   Transportation Needs:   . Film/video editor (Medical):   Marland Kitchen Lack of Transportation (Non-Medical):   Physical Activity:   . Days of Exercise per Week:   . Minutes of Exercise per Session:   Stress:   . Feeling of Stress :   Social Connections:   . Frequency of Communication with Friends and Family:   . Frequency of Social Gatherings with Friends and Family:   . Attends Religious Services:   . Active Member of Clubs or Organizations:   . Attends Archivist Meetings:   Marland Kitchen Marital Status:   Intimate Partner Violence:   . Fear of Current or Ex-Partner:   . Emotionally Abused:   Marland Kitchen Physically Abused:   . Sexually Abused:     Allergies  Allergen Reactions  . Percocet [Oxycodone-Acetaminophen] Hives and Itching  . Vicodin [Hydrocodone-Acetaminophen] Hives and Itching  . Shellfish Allergy Hives  . Chocolate Flavor Other (See Comments)    Unknown reaction  . Chocolate Hives  . Tomato Hives    Current Facility-Administered Medications  Medication Dose Route Frequency Provider Last Rate Last Admin  . aspirin EC tablet 81 mg  81 mg Oral Daily Vernelle Emerald, MD   81 mg at 01/12/20 0831  . atorvastatin (LIPITOR) tablet 80 mg  80 mg Oral QHS Vernelle Emerald, MD   80 mg at 01/11/20 2321  . hydrALAZINE (APRESOLINE) injection 10 mg  10 mg Intravenous Q6H PRN Vernelle Emerald, MD      . HYDROmorphone (DILAUDID) injection 0.5 mg  0.5 mg  Intravenous Q3H PRN Shalhoub, Sherryll Burger, MD       Or  . HYDROmorphone (DILAUDID) injection 1 mg  1 mg Intravenous Q3H PRN Shalhoub, Sherryll Burger, MD   1 mg at 01/12/20 1027  . insulin aspart (novoLOG) injection 0-9 Units  0-9 Units Subcutaneous TID AC & HS Shalhoub, Sherryll Burger, MD   1 Units at 01/12/20 1248  . isosorbide mononitrate (IMDUR) 24 hr tablet 60 mg  60 mg Oral Daily Shalhoub, Sherryll Burger, MD   60 mg at 01/12/20 0833  .  metoprolol tartrate (LOPRESSOR) tablet 12.5 mg  12.5 mg Oral BID Lavina Hamman, MD   12.5 mg at 01/12/20 0843  . mycophenolate (MYFORTIC) EC tablet 720 mg  720 mg Oral BID Lavina Hamman, MD      . nicotine (NICODERM CQ - dosed in mg/24 hours) patch 14 mg  14 mg Transdermal Daily Shalhoub, Sherryll Burger, MD   14 mg at 01/12/20 0845  . pantoprazole (PROTONIX) EC tablet 40 mg  40 mg Oral Daily Shalhoub, Sherryll Burger, MD   40 mg at 01/12/20 8416  . piperacillin-tazobactam (ZOSYN) IVPB 3.375 g  3.375 g Intravenous Q8H Rolla Flatten, [facility]      . predniSONE (DELTASONE) tablet 5 mg  5 mg Oral Daily Shalhoub, Sherryll Burger, MD   5 mg at 01/12/20 0830  . ranolazine (RANEXA) 12 hr tablet 500 mg  500 mg Oral BID Vernelle Emerald, MD   500 mg at 01/12/20 0830  . rivaroxaban (XARELTO) tablet 2.5 mg  2.5 mg Oral BID Vernelle Emerald, MD   2.5 mg at 01/12/20 6063  . sodium bicarbonate tablet 1,300 mg  1,300 mg Oral BID Vernelle Emerald, MD   1,300 mg at 01/12/20 0830  . tacrolimus (PROGRAF) capsule 2 mg  2 mg Oral QHS Shalhoub, Sherryll Burger, MD   2 mg at 01/12/20 0022  . tacrolimus (PROGRAF) capsule 3 mg  3 mg Oral q AM Shalhoub, Sherryll Burger, MD   3 mg at 01/12/20 1030  . tamsulosin (FLOMAX) capsule 0.4 mg  0.4 mg Oral QHS Shalhoub, Sherryll Burger, MD   0.4 mg at 01/11/20 2322    REVIEW OF SYSTEMS:  [X]  denotes positive finding, [ ]  denotes negative finding Cardiac  Comments:  Chest pain or chest pressure: x   Shortness of breath upon exertion:    Short of breath when lying flat:    Irregular heart  rhythm:        Vascular    Pain in calf, thigh, or hip brought on by ambulation: x   Pain in feet at night that wakes you up from your sleep:     Blood clot in your veins:    Leg swelling:         Pulmonary    Oxygen at home:    Productive cough:     Wheezing:         Neurologic    Sudden weakness in arms or legs:     Sudden numbness in arms or legs:     Sudden onset of difficulty speaking or slurred speech:    Temporary loss of vision in one eye:     Problems with dizziness:         Gastrointestinal    Blood in stool:     Vomited blood:         Genitourinary    Burning when urinating:     Blood in urine:        Psychiatric    Major depression:         Hematologic    Bleeding problems:    Problems with blood clotting too easily:        Skin    Rashes or ulcers: x       Constitutional    Fever or chills:     PHYSICAL EXAM:   Vitals:   01/12/20 0330 01/12/20 0340 01/12/20 0451 01/12/20 0804  BP: 120/70 120/70 127/79 (!) 151/90  Pulse:  90 91 89  Resp: 20 20 18 18   Temp:   98.4 F (36.9 C) 98.9 F (37.2 C)  TempSrc:   Oral Oral  SpO2:  100% 100% 99%  Weight:      Height:        GENERAL: The patient is a well-nourished male, in no acute distress. The vital signs are documented above. CARDIAC: There is a regular rate and rhythm.  VASCULAR: I do not detect carotid bruits. On the right side, he has a palpable femoral pulse.  I cannot palpate popliteal or pedal pulses.  He has a monophasic dorsalis pedis and posterior tibial signal with the Doppler.  He also has a peroneal signal. On the left side, which is the side of concern, he has a palpable femoral pulse.  I cannot palpate a popliteal or pedal pulses.  He does have a fairly brisk posterior tibial signal with the Doppler and a fairly brisk peroneal signal.  He has a dampened monophasic anterior tibial signal with Doppler.  I cannot obtain a dorsalis pedis signal. He currently has no significant lower  extremity swelling. PULMONARY: There is good air exchange bilaterally without wheezing or rales. ABDOMEN: Soft and non-tender with normal pitched bowel sounds.  MUSCULOSKELETAL: There are no major deformities or cyanosis. NEUROLOGIC: No focal weakness or paresthesias are detected. SKIN: He has a wound on the lateral aspect of his left leg.  This tracks approximately 4 inches all the way down towards the lateral malleolus.  There is moderate drainage from this.    PSYCHIATRIC: The patient has a normal affect.  DATA:    LABS: His GFR is 49.  Creatinine 1.74.  White blood cell count 12.  Hemoglobin 9.1.  Platelets 419,000.  CULTURES: 2 blood cultures done on 01/11/2020 are negative so far.  The wound culture done on 01/11/2020 so far is negative.  Gram stain did not show any organisms.  X-RAY TIB-FIB LEFT: His plain x-ray did not show any evidence of osteomyelitis.

## 2020-01-12 NOTE — Progress Notes (Addendum)
Pharmacy Antibiotic Note  Theodore Arellano is a 59 y.o. male admitted on 01/11/2020 with worsening LLE wound. Pharmacy consulted for Vancomycin + Meropenem dosing.   The patient is noted to have a renal transplant and was admitted with AKI with SCr 3.04 (baseline 1-1.3?) - now improving with SCr down to 1.74. Will enter a one time dose of Vanc 1g to be given this evening and consider entering standing doses starting on 4/14 based on SCr trends.  Plan: - Vancomycin 1g IV x 1 dose at 2200 today - No standing Vancomycin for now pending trends in SCr - Adjust Meropenem to 1g IV every 12 hours - Will continue to follow renal function, culture results, LOT, and antibiotic de-escalation plans   --------------------------------------------------------------------------------------- Addendum: The MD has reviewed the patient's antibiotic regimen and the plan is now to transition to Zosyn.  The RN had already mixed the dose of Meropenem for the 1000 AM dose - instructed her to give that to avoid waste and will plan to start Zosyn dosing this evening.   Plan - D/c Meropenem  - Zosyn 3.375g EI q8h - Will continue to follow renal function, culture results, LOT, and antibiotic de-escalation plans   Alycia Rossetti, PharmD, BCPS 10:40 AM    Height: 5\' 7"  (170.2 cm) Weight: 78.9 kg (174 lb) IBW/kg (Calculated) : 66.1  Temp (24hrs), Avg:99.4 F (37.4 C), Min:98.4 F (36.9 C), Max:100.8 F (38.2 C)  Recent Labs  Lab 01/11/20 1233 01/11/20 2010 01/12/20 0656  WBC 12.8*  --  12.0*  CREATININE 3.04*  --  1.74*  LATICACIDVEN  --  1.2  --     Estimated Creatinine Clearance: 42.7 mL/min (A) (by C-G formula based on SCr of 1.74 mg/dL (H)).    Allergies  Allergen Reactions  . Percocet [Oxycodone-Acetaminophen] Hives and Itching  . Vicodin [Hydrocodone-Acetaminophen] Hives and Itching  . Shellfish Allergy Hives  . Chocolate Flavor Other (See Comments)    Unknown reaction  . Chocolate Hives  .  Tomato Hives    Augmentin PTA Vanc 4/12 >> Mero 4/12 >>  4/12 COVID >> neg 4/12 LLE WCx >> 4/12 BCx >> 4/13 MRSA PCR >> neg  Thank you for allowing pharmacy to be a part of this patient's care.  Alycia Rossetti, PharmD, BCPS Clinical Pharmacist Clinical phone for 01/12/2020: [phone] 01/12/2020 9:38 AM   **Pharmacist phone directory can now be found on [email or web address] ([email or web address]).  Listed under [place].

## 2020-01-12 NOTE — Plan of Care (Signed)
  Problem: Clinical Measurements: Goal: Ability to avoid or minimize complications of infection will improve Outcome: Progressing   

## 2020-01-12 NOTE — Progress Notes (Signed)
ABI has been completed.   Preliminary results in CV Proc.   Theodore Arellano 01/12/2020 2:36 PM

## 2020-01-12 NOTE — Progress Notes (Signed)
[facility] Progress Note  Patient: Theodore Arellano    LOV:[id number]  DOA: 01/11/2020     Date of Service: the patient was seen and examined on 01/12/2020  Chief Complaint  Patient presents with   Weakness   Brief hospital course: 58 year old male with past medical history of coronary artery disease status post CABG in August 2020, status post left renal transplant in 2011, but is mellitus type II, gastroesophageal reflux disease, peripheral vascular disease (status post left femoral endarterectomy October 2020 followed by left femoral to posterior tibial artery bypass in October 2020), 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 Patient explains that he wears a wound VAC for management of his left lower extremity vascular wound.  He unfortunately forgot to charge the wound VAC for an unspecified amount of time.  In the days that followed, patient began to develop worsening pain of the left lower extremity.  Patient describes the pain is severe in intensity, throbbing in quality, worse with weightbearing or movement of the left lower extremity.  Pain is nonradiating.  Over the following neck several weeks patient also began to develop redness swelling and warmth of the left lower extremity with increasing foul smell coming from the left leg. Presented with above complaint.  Wound care was consulted.  Vascular surgery consulted. Discussed with vascular surgery at [facility]. Currently further plan is continuing IV antibiotics and follow-up on vascular recommendation as well as potentially arrange for transfer to [facility]..  Assessment and Plan: 1.  Sepsis secondary to wound infection. Concern for osteomyelitis.  Patient has chronic left lower extremity wound and has a wound VAC. Patient has peripheral  vascular disease and has undergone bypass surgery on that left leg. Incision and debridement was performed on December 2020 at [facility] and wound [facility] was placed. Patient was recently started on oral Augmentin outpatient without any benefit. Currently I will continue the antibiotics transition to IV Zosyn. Discussed with vascular surgery at [facility], they recommended vascular surgery evaluation here for need for debridement but otherwise the patient is currently accepted for transfer to [facility] for further care and treatment. Continue pain control.  Continue IV fluids.  2.  Acute kidney injury on chronic kidney disease. SP renal transplant. Transplant performed at [facility]. Patient is on IV fluids per Renal function improving.  Monitor.  3.  Immunosuppressive straight. History of renal transplant. Continue patient's home regimen. Continue prednisone  4.  Type 2 diabetes mellitus controlled with PVD. Continue sliding scale insulin.  5.  CAD Currently chest pain-free. History of CABG in 2020. Continue to monitor for now.  6.  History of DVT Patient is on Xarelto. DVT identified in December 2020.  Patient has been on Xarelto.  Monitor.  Diet: Cardiac diet DVT Prophylaxis: Therapeutic Anticoagulation with Heparin   Advance goals of care discussion: Full code  Family Communication: no family was present at bedside, at the time of interview.   Disposition:  Status is: Inpatient  Remains inpatient appropriate because:Ongoing diagnostic testing needed not appropriate for outpatient work up   Dispo: The patient is from: Home              Anticipated d/c is to: SNF  Anticipated d/c date is: 3 days              Patient currently is not medically stable to d/c.         Subjective: Reports pain.  No nausea no vomiting.  No fever no chills, no chest pain.  No diarrhea.  Pain controlled with pain medications.  Physical  Exam: General:  alert oriented to time, place, and person.  Appear in mild distress, affect appropriate Eyes: PERRL ENT: Oral Mucosa Clear, moist  Neck: no JVD,  Cardiovascular: S1 and S2 Present, no Murmur,  Respiratory: good respiratory effort, Bilateral Air entry equal and Decreased, no Crackles, no wheezes Abdomen: Bowel Sound present, Soft and no tenderness,  Skin: no rash Extremities: no Pedal edema, no calf tenderness Neurologic: without any new focal findings  Gait not checked due to patient safety concerns  Vitals:   01/12/20 0340 01/12/20 0451 01/12/20 0804 01/12/20 1522  BP: 120/70 127/79 (!) 151/90 114/71  Pulse: 90 91 89 75  Resp: 20 18 18 16   Temp:  98.4 F (36.9 C) 98.9 F (37.2 C) (!) 97.5 F (36.4 C)  TempSrc:  Oral Oral Oral  SpO2: 100% 100% 99% 100%  Weight:      Height:        Intake/Output Summary (Last 24 hours) at 01/12/2020 1853 Last data filed at 01/12/2020 1548 Gross per 24 hour  Intake 2101.81 ml  Output 350 ml  Net 1751.81 ml   Filed Weights   01/11/20 1230  Weight: 78.9 kg    Data Reviewed: I have personally reviewed and interpreted daily labs, tele strips, imagings as discussed above. I reviewed all nursing notes, pharmacy notes, vitals, pertinent old records I have discussed plan of care as described above with RN and patient/family.  CBC: Recent Labs  Lab 01/11/20 1233 01/12/20 0656  WBC 12.8* 12.0*  NEUTROABS  --  9.6*  HGB 10.7* 9.1*  HCT 35.5* 29.5*  MCV 85.1 83.1  PLT 423* 829*   Basic Metabolic Panel: Recent Labs  Lab 01/11/20 1233 01/12/20 0656  NA 134* 135  K 3.9 3.6  CL 97* 102  CO2 22 23  GLUCOSE 97 131*  BUN 39* 25*  CREATININE 3.04* 1.74*  CALCIUM 9.5 9.2    Studies: US Renal Transplant w/Doppler  Result Date: 01/11/2020 CLINICAL DATA:  Transplant kidney, AKI EXAM: ULTRASOUND OF RENAL TRANSPLANT WITH RENAL DOPPLER ULTRASOUND TECHNIQUE: Ultrasound examination of the renal transplant was performed with  gray-scale, color and duplex doppler evaluation. COMPARISON:  CT 11/08/2019 FINDINGS: Transplant kidney location: LLQ Transplant Kidney: Renal measurements: 11 x 6.2 x 5.7 cm = volume: 203.51mL. Normal in size and parenchymal echogenicity. No evidence of mass or hydronephrosis. No peri-transplant fluid collection seen. Mild pelvicaliectasis without calyceal blunting to suggest frank hydronephrosis. Color flow in the main renal artery:  Yes Color flow in the main renal vein:  Yes Duplex Doppler Evaluation: Main Renal Artery Resistive Index: 0.75 Venous waveform in main renal vein:  Present Intrarenal resistive index in upper pole:  0.71 (normal 0.6-0.8; equivocal 0.8-0.9; abnormal >= 0.9) Intrarenal resistive index in lower pole: 0.62 (normal 0.6-0.8; equivocal 0.8-0.9; abnormal >= 0.9) Bladder: Mild bladder wall thickening is similar to comparison CTs. No focal wall thickening. Bladder jets not identified. Other findings: A native left kidney is not well visualized. Atrophic right kidney measuring 7.8 x 2.8 x 3.3 cm with increased cortical echogenicity and small anechoic cysts measuring up to 1.2 cm in size. IMPRESSION: Transplant kidney  in the left lower quadrant with mild pelvicaliectasis but absent calyceal blunting to suggest frank hydronephrosis. No peritransplant fluid collection is evident. Resistive indices are within normal limits. Please note, clinical utility of these values is limited in the absence of comparison as these are typically utilized to document change over time. Mild circumferential mural thickening of the bladder, similar to comparison studies, correlate with patient history and consider urinalysis to exclude superimposed cystitis. Atrophic native right kidney with few small cysts. Native left kidney is poorly visualized. Electronically Signed   By: Lovena Le M.D.   On: 01/11/2020 21:20   VAS [place] ABI WITH/WO TBI  Result Date: 01/12/2020 LOWER EXTREMITY DOPPLER STUDY Indications:  Ulceration, and PVD. High Risk Factors: Hypertension, hyperlipidemia.  Comparison Study: no prior Performing Technologist: Abram Sander RVS  Examination Guidelines: A complete evaluation includes at minimum, Doppler waveform signals and systolic blood pressure reading at the level of bilateral brachial, anterior tibial, and posterior tibial arteries, when vessel segments are accessible. Bilateral testing is considered an integral part of a complete examination. Photoelectric Plethysmograph (PPG) waveforms and toe systolic pressure readings are included as required and additional duplex testing as needed. Limited examinations for reoccurring indications may be performed as noted.  ABI Findings: +---------+------------------+-----+----------+--------+  Right     Rt Pressure (mmHg) Index Waveform   Comment   +---------+------------------+-----+----------+--------+  Brachial  137                      triphasic            +---------+------------------+-----+----------+--------+  PTA       54                 0.39  monophasic           +---------+------------------+-----+----------+--------+  DP        57                 0.42  monophasic           +---------+------------------+-----+----------+--------+  Great Toe 29                 0.21                       +---------+------------------+-----+----------+--------+ +---------+------------------+-----+--------+----------------------------------+  Left      Lt Pressure (mmHg) Index Waveform Comment                             +---------+------------------+-----+--------+----------------------------------+  Brachial  134                                                                   +---------+------------------+-----+--------+----------------------------------+  PTA                                biphasic unable to do pressures due to pain                                               tolerance                            +---------+------------------+-----+--------+----------------------------------+  DP                                 biphasic unable to do pressures due to pain                                               tolerance                           +---------+------------------+-----+--------+----------------------------------+  Great Toe 100                0.73                                               +---------+------------------+-----+--------+----------------------------------+  Summary: Right: Resting right ankle-brachial index indicates severe right lower extremity arterial disease. The right toe-brachial index is abnormal. Left: The left toe-brachial index is normal. Unable to do pressures due to pain tolerance.  *See table(s) above for measurements and observations.  Electronically signed by Deitra Mayo MD on 01/12/2020 at 3:14:50 PM.   Final     Scheduled Meds:  aspirin EC  81 mg Oral Daily   atorvastatin  80 mg Oral QHS   enoxaparin (LOVENOX) injection  80 mg Subcutaneous Q12H   insulin aspart  0-9 Units Subcutaneous TID AC & HS   isosorbide mononitrate  60 mg Oral Daily   metoprolol tartrate  12.5 mg Oral BID   mycophenolate  720 mg Oral BID   nicotine  14 mg Transdermal Daily   pantoprazole  40 mg Oral Daily   predniSONE  5 mg Oral Daily   ranolazine  500 mg Oral BID   sodium bicarbonate  1,300 mg Oral BID   tacrolimus  2 mg Oral QHS   tacrolimus  3 mg Oral q AM   tamsulosin  0.4 mg Oral QHS   Continuous Infusions:  piperacillin-tazobactam (ZOSYN)  IV     PRN Meds: hydrALAZINE, HYDROmorphone (DILAUDID) injection **OR** HYDROmorphone (DILAUDID) injection  Time spent: 35 minutes  Author: Berle Mull, MD [facility] 01/12/2020 6:53 PM  To reach On-call, see care teams to locate the attending and reach out to them via [email or web address]. If 7PM-7AM, please contact night-coverage If you still have difficulty reaching the attending provider, please page  the [facility] (Director on Call) for [facility] on amion for assistance.

## 2020-01-13 DIAGNOSIS — L03116 Cellulitis of left lower limb: Secondary | ICD-10-CM | POA: Diagnosis not present

## 2020-01-13 DIAGNOSIS — G894 Chronic pain syndrome: Secondary | ICD-10-CM | POA: Diagnosis not present

## 2020-01-13 DIAGNOSIS — M869 Osteomyelitis, unspecified: Secondary | ICD-10-CM | POA: Insufficient documentation

## 2020-01-13 DIAGNOSIS — N179 Acute kidney failure, unspecified: Secondary | ICD-10-CM | POA: Diagnosis not present

## 2020-01-13 DIAGNOSIS — L0390 Cellulitis, unspecified: Secondary | ICD-10-CM | POA: Diagnosis not present

## 2020-01-13 LAB — CBC WITH DIFFERENTIAL/PLATELET
Abs Immature Granulocytes: 0.13 10*3/uL — ABNORMAL HIGH (ref 0.00–0.07)
Basophils Absolute: 0 10*3/uL (ref 0.0–0.1)
Basophils Relative: 0 %
Eosinophils Absolute: 0.3 10*3/uL (ref 0.0–0.5)
Eosinophils Relative: 3 %
HCT: 26.7 % — ABNORMAL LOW (ref 39.0–52.0)
Hemoglobin: 8.4 g/dL — ABNORMAL LOW (ref 13.0–17.0)
Immature Granulocytes: 1 %
Lymphocytes Relative: 10 %
Lymphs Abs: 0.9 10*3/uL (ref 0.7–4.0)
MCH: 25.8 pg — ABNORMAL LOW (ref 26.0–34.0)
MCHC: 31.5 g/dL (ref 30.0–36.0)
MCV: 82.2 fL (ref 80.0–100.0)
Monocytes Absolute: 1 10*3/uL (ref 0.1–1.0)
Monocytes Relative: 10 %
Neutro Abs: 7.3 10*3/uL (ref 1.7–7.7)
Neutrophils Relative %: 76 %
Platelets: 410 10*3/uL — ABNORMAL HIGH (ref 150–400)
RBC: 3.25 MIL/uL — ABNORMAL LOW (ref 4.22–5.81)
RDW: 18.1 % — ABNORMAL HIGH (ref 11.5–15.5)
WBC: 9.6 10*3/uL (ref 4.0–10.5)
nRBC: 0 % (ref 0.0–0.2)

## 2020-01-13 LAB — COMPREHENSIVE METABOLIC PANEL
ALT: 9 U/L (ref 0–44)
AST: 16 U/L (ref 15–41)
Albumin: 2 g/dL — ABNORMAL LOW (ref 3.5–5.0)
Alkaline Phosphatase: 59 U/L (ref 38–126)
Anion gap: 10 (ref 5–15)
BUN: 23 mg/dL — ABNORMAL HIGH (ref 6–20)
CO2: 25 mmol/L (ref 22–32)
Calcium: 9.1 mg/dL (ref 8.9–10.3)
Chloride: 104 mmol/L (ref 98–111)
Creatinine, Ser: 1.46 mg/dL — ABNORMAL HIGH (ref 0.61–1.24)
GFR calc Af Amer: >60 mL/min (ref >60)
GFR calc non Af Amer: 52 mL/min — ABNORMAL LOW (ref >60)
Glucose, Bld: 110 mg/dL — ABNORMAL HIGH (ref 70–99)
Potassium: 3.8 mmol/L (ref 3.5–5.1)
Sodium: 139 mmol/L (ref 135–145)
Total Bilirubin: 0.7 mg/dL (ref 0.3–1.2)
Total Protein: 5.9 g/dL — ABNORMAL LOW (ref 6.5–8.1)

## 2020-01-13 LAB — HEMOGLOBIN A1C
Hgb A1c MFr Bld: 5.5 % (ref 4.8–5.6)
Mean Plasma Glucose: 111 mg/dL

## 2020-01-13 LAB — MAGNESIUM: Magnesium: 1.7 mg/dL (ref 1.7–2.4)

## 2020-01-13 LAB — GLUCOSE, CAPILLARY
Glucose-Capillary: 82 mg/dL (ref 70–99)
Glucose-Capillary: 120 mg/dL — ABNORMAL HIGH (ref 70–99)

## 2020-01-13 MED ORDER — ENOXAPARIN SODIUM 80 MG/0.8ML ~~LOC~~ SOLN: 80.0000 mg | Freq: Two times a day (BID) | SUBCUTANEOUS | Status: DC

## 2020-01-13 MED ORDER — PIPERACILLIN-TAZOBACTAM 3.375 G IVPB: 3.3750 g | Freq: Three times a day (TID) | INTRAVENOUS | Status: DC

## 2020-01-13 MED ORDER — INSULIN ASPART 100 UNIT/ML ~~LOC~~ SOLN: 0.0000 [IU] | Freq: Three times a day (TID) | SUBCUTANEOUS | 11 refills | Status: DC

## 2020-01-13 MED ORDER — NICOTINE 14 MG/24HR TD PT24: 14.0000 mg | MEDICATED_PATCH | Freq: Every day | TRANSDERMAL | 0 refills | Status: DC

## 2020-01-13 NOTE — Plan of Care (Signed)

## 2020-01-13 NOTE — Discharge Summary (Signed)
Discharge Summary  Theodore Arellano QPY:[id number] DOB: [birth year]  PCP: [name], MD  Admit date: 01/11/2020 Discharge date: 01/13/2020  Time spent: 40 mins  Recommendations for Outpatient Follow-up:  1. To be determined by [facility] physicians  Discharge Diagnoses:  Active Hospital Problems   Diagnosis Date Noted  . Cellulitis of left lower extremity 01/11/2020  . Sepsis ([place]) 01/11/2020  . DM (diabetes mellitus) type II, controlled, with peripheral vascular disorder ([place]) 01/11/2020  . Chronic pain syndrome 01/11/2020  . Sepsis due to cellulitis ([place]) 01/11/2020  . Acute kidney injury ([place]) 11/08/2019  . Coronary artery disease involving native coronary artery of native heart without angina pectoris     Resolved Hospital Problems  No resolved problems to display.    Discharge Condition: Stable  Diet recommendation: Heart healthy, modified carbs  Vitals:   01/13/20 0817 01/13/20 0846  BP: (!) 153/74 (!) 147/72  Pulse: 72 72  Resp: 17 17  Temp: 98.5 F (36.9 C) 97.8 F (36.6 C)  SpO2: 100% 100%    History of present illness:  59 year old male with past medical history of coronary artery disease status post CABG in August 2020, status post left renal transplant in 2011, but is mellitus type II, gastroesophageal reflux disease, peripheral vascular disease (status post left femoral endarterectomy October 2020 followed by left femoral to posterior tibial artery bypass in October 2020), 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 Patient explains that he wears a wound VAC for management of his left lower extremity vascular wound.  He unfortunately forgot to charge the wound VAC for an unspecified amount of time.  In the days that followed, patient began to develop worsening pain of the left  lower extremity.  Patient describes the pain is severe in intensity, throbbing in quality, worse with weightbearing or movement of the left lower extremity.  Pain is nonradiating.  Over the following neck several weeks patient also began to develop redness swelling and warmth of the left lower extremity with increasing foul smell coming from the left leg. Presented with above complaint.  Wound care was consulted.  Vascular surgery consulted. Discussed with vascular surgery at [facility]. Currently further plan is continuing IV antibiotics and follow-up on vascular recommendation as well as transfer to [facility]. Pt has been accepted, and a bed available for transfer today.    Today, patient denies any new complaints, denies any shortness of breath, chest pain, abdominal pain, nausea/vomiting, fever/chills.  Patient to transfer to [facility] for further management.   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 Patient has peripheral vascular disease and has undergone bypass surgery on that left leg. Incision and debridement was performed on December 2020 at [facility] and wound [facility] was placed. Patient was recently started on oral Augmentin outpatient without any benefit, currently on  IV Zosyn Discussed with vascular surgery at [facility], they recommended vascular surgery evaluation here for need for debridement but otherwise the patient is currently accepted for transfer to  [facility] for further care and treatment. Continue pain control Transfer to [facility]  Acute kidney injury on chronic kidney disease SP renal transplant Transplant performed at [facility]. Renal function improving s/p IVF  Immunosuppressive state History of renal transplant Continue patient's immunosuppressive regimen Continue prednisone  Type 2 diabetes mellitus controlled with PVD Continue sliding scale insulin, ranolazine  CAD Currently chest pain-free. History of CABG in 2020  History of DVT Patient is on Xarelto DVT identified in December 2020 Patient has been on Xarelto, currently on hold due to possible upcoming procedure Continue therapeutic Lovenox twice daily for now        Malnutrition Type:      Malnutrition Characteristics:      Nutrition Interventions:      Estimated body mass index is 27.25 kg/m as calculated from the following:   Height as of this encounter: 5' 7" (1.702 m).   Weight as of this encounter: 78.9 kg.    Procedures:  None  Consultations:  Vascular surgery  Discharge Exam: BP (!) 147/72 (BP Location: Left Arm)   Pulse 72   Temp 97.8 F (36.6 C) (Oral)   Resp 17   Ht 5' 7" (1.702 m)   Wt 78.9 kg   SpO2 100%   BMI 27.25 kg/m   General: NAD Cardiovascular: S1, S2 present Respiratory: CTA B  Discharge Instructions You were cared for by a hospitalist during your hospital stay. If you have any questions about your discharge medications or the care you received while you were in the hospital after you are discharged, you can call the unit and asked to speak with the hospitalist on call if the hospitalist that took care of you is not available. Once you are discharged, your primary care physician will handle any further medical issues. Please note that NO REFILLS for any discharge medications will be authorized once you are discharged, as it is imperative that you return to your primary care  physician (or establish a relationship with a primary care physician if you do not have one) for your aftercare needs so that they can reassess your need for medications and monitor your lab values.  Discharge Instructions    Diet - low sodium heart healthy   Complete by: As directed    Increase activity slowly   Complete by: As directed      Allergies as of 01/13/2020      Reactions   Percocet [oxycodone-acetaminophen] Hives, Itching   Vicodin [hydrocodone-acetaminophen] Hives, Itching   Shellfish Allergy Hives   Chocolate Flavor Other (See Comments)   Unknown reaction   Chocolate Hives   Tomato Hives      Medication List    STOP taking these medications   amoxicillin-clavulanate 875-125 MG tablet Commonly known as: AUGMENTIN   Xarelto 2.5 MG Tabs tablet Generic drug: rivaroxaban     TAKE these medications   aspirin EC 81 MG tablet Take 81 mg by mouth daily as needed.   atorvastatin 80 MG tablet Commonly known as: LIPITOR Take 80 mg by mouth at bedtime.   blood glucose meter kit and supplies Kit Dispense based on patient and insurance preference. Use up to four times daily as directed. (FOR ICD-9 250.00, 250.01).   colchicine 0.6 MG tablet Take 0.6 mg by mouth daily as  needed (for gout).   enoxaparin 80 MG/0.8ML injection Commonly known as: LOVENOX Inject 0.8 mLs (80 mg total) into the skin every 12 (twelve) hours.   gabapentin 100 MG capsule Commonly known as: NEURONTIN Take 200 mg by mouth 3 (three) times daily as needed.   HYDROmorphone 4 MG tablet Commonly known as: DILAUDID Take 4 mg by mouth 3 (three) times daily as needed for pain.   insulin aspart 100 UNIT/ML injection Commonly known as: novoLOG Inject 0-9 Units into the skin 4 (four) times daily -  before meals and at bedtime.   isosorbide mononitrate 30 MG 24 hr tablet Commonly known as: IMDUR Take 2 tablets (60 mg total) by mouth daily.   metoprolol tartrate 25 MG tablet Commonly known as:  LOPRESSOR Take 12.5 mg by mouth 2 (two) times daily.   mycophenolate 360 MG Tbec EC tablet Commonly known as: MYFORTIC Take 720 mg by mouth 2 (two) times daily.   Narcan 4 MG/0.1ML Liqd nasal spray kit Generic drug: naloxone Place 1 spray into the nose once.   nicotine 14 mg/24hr patch Commonly known as: NICODERM CQ - dosed in mg/24 hours Place 1 patch (14 mg total) onto the skin daily. Start taking on: January 14, 2020   pantoprazole 40 MG tablet Commonly known as: PROTONIX Take 40 mg by mouth daily.   piperacillin-tazobactam 3.375 GM/50ML IVPB Commonly known as: ZOSYN Inject 50 mLs (3.375 g total) into the vein every 8 (eight) hours.   predniSONE 5 MG tablet Commonly known as: DELTASONE Take 5 mg by mouth daily.   ranolazine 500 MG 12 hr tablet Commonly known as: RANEXA Take 500 mg by mouth 2 (two) times daily.   sodium bicarbonate 650 MG tablet Take 1,300 mg by mouth 2 (two) times daily.   tacrolimus 1 MG capsule Commonly known as: PROGRAF Take 2-3 mg by mouth See admin instructions. Take 59m in the morning and 246mat night, 12 hours apart.   tamsulosin 0.4 MG Caps capsule Commonly known as: FLOMAX Take 0.4 mg by mouth at bedtime.   Vitamin D (Ergocalciferol) 1.25 MG (50000 UNIT) Caps capsule Commonly known as: DRISDOL Take 50,000 Units by mouth once a week.      Allergies  Allergen Reactions  . Percocet [Oxycodone-Acetaminophen] Hives and Itching  . Vicodin [Hydrocodone-Acetaminophen] Hives and Itching  . Shellfish Allergy Hives  . Chocolate Flavor Other (See Comments)    Unknown reaction  . Chocolate Hives  . Tomato Hives      The results of significant diagnostics from this hospitalization (including imaging, microbiology, ancillary and laboratory) are listed below for reference.    Significant Diagnostic Studies: DG Chest 2 View  Result Date: 01/11/2020 CLINICAL DATA:  Fever, lower extremity infection EXAM: CHEST - 2 VIEW COMPARISON:  11/08/2019  FINDINGS: Frontal and lateral views of the chest demonstrate stable postoperative changes from CABG. Cardiac silhouette is enlarged but stable. No airspace disease, effusion, or pneumothorax. IMPRESSION: 1. No acute intrathoracic process. Electronically Signed   By: MiRanda Ngo.D.   On: 01/11/2020 18:36   DG Tibia/Fibula Left  Result Date: 01/11/2020 CLINICAL DATA:  Left lower leg wound concern for deep space infection EXAM: LEFT TIBIA AND FIBULA - 2 VIEW COMPARISON:  Radiograph 12/29/2019 FINDINGS: There is a large, saucerized ulceration along the lateral aspect of the distal left calf. There is a separate focus of subcutaneous gas distal to this site along the lateral aspect of the lower leg (see annotated image). No other foci of  soft tissue gas or radiopaque foreign body. Extensive vascular calcium is noted in the soft tissues. No subjacent areas of periostitis, osseous destructive change, or other features to suggest subjacent osteomyelitis. With mild degenerative changes noted at the knee and ankle. IMPRESSION: Ulcer along the lateral aspect of the distal left calf. There is a separate focus of subcutaneous gas along the lateral aspect of the lower leg. No radiographic evidence of osteomyelitis or radiopaque foreign body. Electronically Signed   By: Lovena Le M.D.   On: 01/11/2020 19:33   DG Ankle Complete Left  Result Date: 12/29/2019 CLINICAL DATA:  Nonhealing wound along the lateral lower leg, initial encounter EXAM: LEFT ANKLE COMPLETE - 3+ VIEW COMPARISON:  11/07/2019 FINDINGS: Soft tissue wound is again seen with wound VAC in place. Degenerative changes of the tarsal bones are seen. Diffuse vascular calcifications are noted. No underlying bony erosive changes are seen. IMPRESSION: Soft tissue wound as described. Chronic changes about the ankle are noted without acute abnormality. Electronically Signed   By: Inez Catalina M.D.   On: 12/29/2019 23:59   US Renal Transplant  w/Doppler  Result Date: 01/11/2020 CLINICAL DATA:  Transplant kidney, AKI EXAM: ULTRASOUND OF RENAL TRANSPLANT WITH RENAL DOPPLER ULTRASOUND TECHNIQUE: Ultrasound examination of the renal transplant was performed with gray-scale, color and duplex doppler evaluation. COMPARISON:  CT 11/08/2019 FINDINGS: Transplant kidney location: LLQ Transplant Kidney: Renal measurements: 11 x 6.2 x 5.7 cm = volume: 203.79m. Normal in size and parenchymal echogenicity. No evidence of mass or hydronephrosis. No peri-transplant fluid collection seen. Mild pelvicaliectasis without calyceal blunting to suggest frank hydronephrosis. Color flow in the main renal artery:  Yes Color flow in the main renal vein:  Yes Duplex Doppler Evaluation: Main Renal Artery Resistive Index: 0.75 Venous waveform in main renal vein:  Present Intrarenal resistive index in upper pole:  0.71 (normal 0.6-0.8; equivocal 0.8-0.9; abnormal >= 0.9) Intrarenal resistive index in lower pole: 0.62 (normal 0.6-0.8; equivocal 0.8-0.9; abnormal >= 0.9) Bladder: Mild bladder wall thickening is similar to comparison CTs. No focal wall thickening. Bladder jets not identified. Other findings: A native left kidney is not well visualized. Atrophic right kidney measuring 7.8 x 2.8 x 3.3 cm with increased cortical echogenicity and small anechoic cysts measuring up to 1.2 cm in size. IMPRESSION: Transplant kidney in the left lower quadrant with mild pelvicaliectasis but absent calyceal blunting to suggest frank hydronephrosis. No peritransplant fluid collection is evident. Resistive indices are within normal limits. Please note, clinical utility of these values is limited in the absence of comparison as these are typically utilized to document change over time. Mild circumferential mural thickening of the bladder, similar to comparison studies, correlate with patient history and consider urinalysis to exclude superimposed cystitis. Atrophic native right kidney with few small  cysts. Native left kidney is poorly visualized. Electronically Signed   By: PLovena LeM.D.   On: 01/11/2020 21:20   MR TIBIA FIBULA LEFT WO CONTRAST  Result Date: 01/12/2020 CLINICAL DATA:  Open wound near the lateral malleolus. EXAM: MRI OF LOWER LEFT EXTREMITY WITHOUT CONTRAST TECHNIQUE: Multiplanar, multisequence MR imaging of the left lower extremity was performed. No intravenous contrast was administered. COMPARISON:  Radiographs 01/11/2020 FINDINGS: There is a large open wound along the lateral aspect of the left leg beginning near the mid calf region extending down along the distal fibular shaft. Gas is noted in the soft tissues. There is diffuse cellulitis and myofasciitis involving the entire lower extremity below the knee. I do not  see any definite findings for pyomyositis. Diffuse osteomyelitis involving the fibula Tenosynovitis involving the peroneal tendons, likely septic tenosynovitis. I suspect the tendons may be ruptured. The medial tendons are intact and the Achilles tendon is intact. No findings suspicious for septic arthritis at the knee or ankle joints. IMPRESSION: 1. Large open wound along the lateral aspect of the left leg extending down along the distal fibular shaft. 2. Diffuse cellulitis and myofasciitis involving the entire lower extremity below the knee. No definite findings for pyomyositis. 3. Osteomyelitis involving the mid distal fibula. 4. Suspect septic tenosynovitis involving the peroneal tendons. Electronically Signed   By: Marijo Sanes M.D.   On: 01/12/2020 20:11   VAS [place] ABI WITH/WO TBI  Result Date: 01/12/2020 LOWER EXTREMITY DOPPLER STUDY Indications: Ulceration, and PVD. High Risk Factors: Hypertension, hyperlipidemia.  Comparison Study: no prior Performing Technologist: Abram Sander RVS  Examination Guidelines: A complete evaluation includes at minimum, Doppler waveform signals and systolic blood pressure reading at the level of bilateral brachial, anterior tibial,  and posterior tibial arteries, when vessel segments are accessible. Bilateral testing is considered an integral part of a complete examination. Photoelectric Plethysmograph (PPG) waveforms and toe systolic pressure readings are included as required and additional duplex testing as needed. Limited examinations for reoccurring indications may be performed as noted.  ABI Findings: +---------+------------------+-----+----------+--------+ Right    Rt Pressure (mmHg)IndexWaveform  Comment  +---------+------------------+-----+----------+--------+ Brachial 137                    triphasic          +---------+------------------+-----+----------+--------+ PTA      54                0.39 monophasic         +---------+------------------+-----+----------+--------+ DP       57                0.42 monophasic         +---------+------------------+-----+----------+--------+ Great Toe29                0.21                    +---------+------------------+-----+----------+--------+ +---------+------------------+-----+--------+----------------------------------+ Left     Lt Pressure (mmHg)IndexWaveformComment                            +---------+------------------+-----+--------+----------------------------------+ Brachial 134                                                               +---------+------------------+-----+--------+----------------------------------+ PTA                             biphasicunable to do pressures due to pain                                         tolerance                          +---------+------------------+-----+--------+----------------------------------+ DP  biphasicunable to do pressures due to pain                                         tolerance                          +---------+------------------+-----+--------+----------------------------------+ Great Toe100               0.73                                             +---------+------------------+-----+--------+----------------------------------+  Summary: Right: Resting right ankle-brachial index indicates severe right lower extremity arterial disease. The right toe-brachial index is abnormal. Left: The left toe-brachial index is normal. Unable to do pressures due to pain tolerance.  *See table(s) above for measurements and observations.  Electronically signed by Deitra Mayo MD on 01/12/2020 at 3:14:50 PM.   Final    VAS [place] LOWER EXTREMITY VENOUS (DVT)  Result Date: 01/09/2020  Lower Venous DVTStudy Indications: Patient has swelling and pain in left ankle and foot x 2 months. Presently a wound vac is in place in the left lateral calf.  Risk Factors: Patient has PAD with known left CFA-PTA BPG with cryo saphenous vein graft 07/24/2019 at [facility] performed by Dr. Charlyne Quale. Comparison Study: None Performing Technologist: Alecia Mackin RVT, RDCS (AE), RDMS  Examination Guidelines: A complete evaluation includes B-mode imaging, spectral Doppler, color Doppler, and power Doppler as needed of all accessible portions of each vessel. Bilateral testing is considered an integral part of a complete examination. Limited examinations for reoccurring indications may be performed as noted. The reflux portion of the exam is performed with the patient in reverse Trendelenburg.  +-----+---------------+---------+-----------+----------+--------------+ RIGHTCompressibilityPhasicitySpontaneityPropertiesThrombus Aging +-----+---------------+---------+-----------+----------+--------------+ CFV  Full           Yes      Yes                                 +-----+---------------+---------+-----------+----------+--------------+   +---------+---------------+---------+-----------+----------+--------------+ LEFT     CompressibilityPhasicitySpontaneityPropertiesThrombus Aging  +---------+---------------+---------+-----------+----------+--------------+ CFV      Full           Yes      Yes                                 +---------+---------------+---------+-----------+----------+--------------+ SFJ      Full           Yes      Yes                                 +---------+---------------+---------+-----------+----------+--------------+ FV Prox  Full           Yes      Yes                                 +---------+---------------+---------+-----------+----------+--------------+ FV Mid   Full           Yes      Yes                                 +---------+---------------+---------+-----------+----------+--------------+  FV DistalFull           Yes      Yes                                 +---------+---------------+---------+-----------+----------+--------------+ PFV      Full                                                        +---------+---------------+---------+-----------+----------+--------------+ POP      Full           Yes      Yes                                 +---------+---------------+---------+-----------+----------+--------------+ PTV      Full           Yes      Yes                                 +---------+---------------+---------+-----------+----------+--------------+ PERO     Full           Yes      Yes                                 +---------+---------------+---------+-----------+----------+--------------+ Gastroc  Full                                                        +---------+---------------+---------+-----------+----------+--------------+ GSV      Full           Yes      Yes                                 +---------+---------------+---------+-----------+----------+--------------+    Findings reported [facility] Dr. Shaune Pollack [facility] at 10:50 am.  Summary: RIGHT: - No evidence of common femoral vein obstruction.  LEFT: - No evidence of deep vein thrombosis in the lower  extremity. No indirect evidence of obstruction proximal to the inguinal ligament. - No cystic structure found in the popliteal fossa. - There is superficial edema seen on the medial mid calf. Patent BPG noted from groin down to mid calf. Wound Vac wrappings not removed. Left pedal artery was normal with acceleration time of 43m, no ischemia category 1. Enlarged upper thigh lymph node measuring 2.9 x 1.0 x 1.5 cm.  *See table(s) above for measurements and observations. Electronically signed by JCarlyle DollyMD on 01/09/2020 at 8:49:56 AM.    Final     Microbiology: Recent Results (from the past 240 hour(s))  Blood culture (routine x 2)     Status: None (Preliminary result)   Collection Time: 01/11/20  5:45 PM   Specimen: BLOOD LEFT FOREARM  Result Value Ref Range Status   Specimen Description BLOOD LEFT FOREARM  Final   Special Requests   Final    BOTTLES DRAWN  AEROBIC AND ANAEROBIC Blood Culture adequate volume Performed at [facility] [address] [place], [place] [address]    Culture NO GROWTH 2 DAYS  Final   Report Status PENDING  Incomplete  Blood culture (routine x 2)     Status: None (Preliminary result)   Collection Time: 01/11/20  5:45 PM   Specimen: BLOOD  Result Value Ref Range Status   Specimen Description BLOOD LEFT ANTECUBITAL  Final   Special Requests   Final    BOTTLES DRAWN AEROBIC AND ANAEROBIC Blood Culture adequate volume Performed at [facility], [place] [address] [place], [place] [address]    Culture NO GROWTH 2 DAYS  Final   Report Status PENDING  Incomplete  Wound or Superficial Culture     Status: None (Preliminary result)   Collection Time: 01/11/20  8:10 PM   Specimen: Wound  Result Value Ref Range Status   Specimen Description WOUND LEG  Final   Special Requests NONE  Final   Gram Stain   Final    FEW WBC PRESENT, PREDOMINANTLY PMN NO ORGANISMS SEEN    Culture   Final    CULTURE REINCUBATED FOR BETTER GROWTH Performed at [facility], [place] [address] [place], [place] [address]    Report Status PENDING  Incomplete  SARS CORONAVIRUS 2 (TAT 6-24 HRS) Nasopharyngeal Nasopharyngeal Swab     Status: None   Collection Time: 01/11/20  9:06 PM   Specimen: Nasopharyngeal Swab  Result Value Ref Range Status   SARS Coronavirus 2 NEGATIVE NEGATIVE Final    Comment: (NOTE) SARS-CoV-2 target nucleic acids are NOT DETECTED. The SARS-CoV-2 RNA is generally detectable in upper and lower respiratory specimens during the acute phase of infection. Negative results do not preclude SARS-CoV-2 infection, do not rule out co-infections with other pathogens, and should not be used as the sole basis for treatment or other patient management decisions. Negative results must be combined with clinical observations, patient history, and epidemiological information. The expected result is Negative. Fact Sheet for Patients: SugarRoll.be Fact Sheet for Healthcare Providers: [email or web address]-[email or web address]/ This test is not yet approved or cleared by the [place] FDA and  has been authorized for detection and/or diagnosis of SARS-CoV-2 by FDA under an Emergency Use Authorization (EUA). This EUA will remain  in effect (meaning this test can be used) for the duration of the COVID-19 declaration under Section 56 4(b)(1) of the Act, 21 U.S.C. section 360bbb-3(b)(1), unless the authorization is terminated or revoked sooner. Performed at [facility], [place] [address], [place], [place] [address]   MRSA PCR Screening     Status: None   Collection Time: 01/12/20  5:13 AM   Specimen: Nasal Mucosa; Nasopharyngeal  Result Value Ref Range Status   MRSA by PCR NEGATIVE NEGATIVE Final    Comment:        The GeneXpert MRSA Assay (FDA approved for NASAL specimens only), is one component of a comprehensive MRSA colonization surveillance program. It is not intended to diagnose MRSA infection nor to  guide or monitor treatment for MRSA infections. Performed at [facility], [place] [address], [place], [place] [address]      Labs: Basic Metabolic Panel: Recent Labs  Lab 01/11/20 1233 01/12/20 0656 01/13/20 0248  NA 134* 135 139  K 3.9 3.6 3.8  CL 97* 102 104  CO2 _0 GLUCOSE 97 131* 110*  BUN 39* 25* 23*  CREATININE 3.04* 1.74* 1.46*  CALCIUM 9.5 9.2 9.1  MG  --   --  1.7   Liver Function Tests: Recent Labs  Lab 01/12/20 0656 01/13/20 0248  AST 13* 16  ALT 8 9  ALKPHOS 59 59  BILITOT 1.1 0.7  PROT 6.6 5.9*  ALBUMIN 2.2* 2.0*   No results for input(s): LIPASE, AMYLASE in the last 168 hours. No results for input(s): AMMONIA in the last 168 hours. CBC: Recent Labs  Lab 01/11/20 1233 01/12/20 0656 01/13/20 0248  WBC 12.8* 12.0* 9.6  NEUTROABS  --  9.6* 7.3  HGB 10.7* 9.1* 8.4*  HCT 35.5* 29.5* 26.7*  MCV 85.1 83.1 82.2  PLT 423* 419* 410*   Cardiac Enzymes: No results for input(s): CKTOTAL, CKMB, CKMBINDEX, TROPONINI in the last 168 hours. BNP: BNP (last 3 results) No results for input(s): BNP in the last 8760 hours.  ProBNP (last 3 results) No results for input(s): PROBNP in the last 8760 hours.  CBG: Recent Labs  Lab 01/12/20 1145 01/12/20 1821 01/12/20 2042 01/13/20 0813 01/13/20 1211  GLUCAP 140* 157* 184* 82 120*       Signed:  Alma Friendly, MD [facility] 01/13/2020, 2:37 PM

## 2020-01-13 NOTE — Progress Notes (Signed)
   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  Is left femoral to posterior tibial bypass graft is patent with biphasic flow throughout.  Toe pressure on the left is 100 mmHg.  All of this would suggest adequate circulation for healing any procedure that needs to be done to address the left fibula.  ANTICOAGULATION: He was on Xarelto which was held yesterday in anticipation of possible arteriography.  Given that he does not need an arteriogram his Xarelto can be resumed from our standpoint.  However, I am not sure if orthopedics plans any procedures.  Vascular surgery will be available as needed.   SUBJECTIVE:   No complaints.  PHYSICAL EXAM:   Vitals:   01/12/20 0804 01/12/20 1522 01/12/20 1911 01/13/20 0330  BP: (!) 151/90 114/71 (!) 121/92 134/73  Pulse: 89 75 72 66  Resp: 18 16 17 17   Temp: 98.9 F (37.2 C) (!) 97.5 F (36.4 C) 98.1 F (36.7 C) 97.8 F (36.6 C)  TempSrc: Oral Oral Oral Oral  SpO2: 99% 100% 98% 97%  Weight:      Height:       Dressing on left leg is dry.  LABS:   Lab Results  Component Value Date   WBC 9.6 01/13/2020   HGB 8.4 (L) 01/13/2020   HCT 26.7 (L) 01/13/2020   MCV 82.2 01/13/2020   PLT 410 (H) 01/13/2020   Lab Results  Component Value Date   CREATININE 1.46 (H) 01/13/2020    CBG (last 3)  Recent Labs    01/12/20 1145 01/12/20 1821 01/12/20 2042  GLUCAP 140* 157* 184*    PROBLEM LIST:    Active Problems:   Coronary artery disease involving native coronary artery of native heart without angina pectoris   Acute kidney injury ([place])   Cellulitis of left lower extremity   Sepsis ([place])   DM (diabetes mellitus) type II, controlled, with peripheral vascular disorder (HCC)   Chronic pain syndrome   Sepsis due to cellulitis (HCC)   CURRENT MEDS:   . aspirin EC  81 mg Oral Daily  . atorvastatin  80 mg Oral QHS  . enoxaparin (LOVENOX) injection  80 mg  Subcutaneous Q12H  . insulin aspart  0-9 Units Subcutaneous TID AC & HS  . isosorbide mononitrate  60 mg Oral Daily  . metoprolol tartrate  12.5 mg Oral BID  . mycophenolate  720 mg Oral BID  . nicotine  14 mg Transdermal Daily  . pantoprazole  40 mg Oral Daily  . predniSONE  5 mg Oral Daily  . ranolazine  500 mg Oral BID  . sodium bicarbonate  1,300 mg Oral BID  . tacrolimus  2 mg Oral QHS  . tacrolimus  3 mg Oral q AM  . tamsulosin  0.4 mg Oral QHS    Deitra Mayo Office: [phone] 01/13/2020

## 2020-01-13 NOTE — Progress Notes (Addendum)
Received a call from [place] at  [facility] stating that they have a bed available for the patient today. Patient will be admitted to the 5th floor, [employer] [facility] [phone]. Report called in to Amy RN. MD notified. Will follow up with CM to arrange transportation.  1540: Rennis Golden at [employer] and she will  schedule transportation.

## 2020-01-14 LAB — AEROBIC CULTURE W GRAM STAIN (SUPERFICIAL SPECIMEN)

## 2020-01-14 LAB — GLUCOSE, CAPILLARY: Glucose-Capillary: 95 mg/dL (ref 70–99)

## 2020-01-16 LAB — CULTURE, BLOOD (ROUTINE X 2)
Culture: NO GROWTH
Culture: NO GROWTH
Special Requests: ADEQUATE
Special Requests: ADEQUATE

## 2020-01-18 MED ORDER — TRAMADOL HCL 50 MG PO TABS
50.00 | ORAL_TABLET | ORAL | Status: DC
Start: ? — End: 2020-01-18

## 2020-01-18 MED ORDER — BAYER WOMENS 81-300 MG PO TABS
40.00 | ORAL_TABLET | ORAL | Status: DC
Start: 2020-01-19 — End: 2020-01-18

## 2020-01-18 MED ORDER — AMPHOTER B CHOLEST SULF CMPLX
81.00 | Status: DC
Start: 2020-01-19 — End: 2020-01-18

## 2020-01-18 MED ORDER — SUPER B-50 B COMPLEX PO
0.40 | ORAL | Status: DC
Start: ? — End: 2020-01-18

## 2020-01-18 MED ORDER — PREDNISONE 5 MG PO TABS
5.00 | ORAL_TABLET | ORAL | Status: DC
Start: 2020-01-20 — End: 2020-01-18

## 2020-01-18 MED ORDER — CVS KIDPANT BOYS X-LARGE MISC
80.00 | Status: DC
Start: 2020-01-19 — End: 2020-01-18

## 2020-01-18 MED ORDER — SENSI-CARE PERINEAL/SKIN CLNSR EX LIQD
4.00 | CUTANEOUS | Status: DC
Start: ? — End: 2020-01-18

## 2020-01-18 MED ORDER — QUINERVA 260 MG PO TABS
650.00 | ORAL_TABLET | ORAL | Status: DC
Start: ? — End: 2020-01-18

## 2020-01-18 MED ORDER — GENERIC EXTERNAL MEDICATION
12.50 | Status: DC
Start: 2020-01-19 — End: 2020-01-18

## 2020-01-18 MED ORDER — GABAPENTIN 100 MG PO CAPS
100.00 | ORAL_CAPSULE | ORAL | Status: DC
Start: 2020-01-19 — End: 2020-01-18

## 2020-01-18 MED ORDER — CVS B-6 PO
17.00 | ORAL | Status: DC
Start: 2020-01-19 — End: 2020-01-18

## 2020-01-18 MED ORDER — TACROLIMUS 1 MG PO CAPS
2.00 | ORAL_CAPSULE | ORAL | Status: DC
Start: 2020-01-20 — End: 2020-01-18

## 2020-01-18 MED ORDER — MAGNESIUM CHLORIDE 64 MG PO TBEC
64.00 | DELAYED_RELEASE_TABLET | ORAL | Status: DC
Start: 2020-01-18 — End: 2020-01-18

## 2020-01-18 MED ORDER — RANOLAZINE ER 500 MG PO TB12
500.00 | ORAL_TABLET | ORAL | Status: DC
Start: 2020-01-19 — End: 2020-01-18

## 2020-01-18 MED ORDER — Medication
Status: DC
Start: 2020-01-18 — End: 2020-01-18

## 2020-01-18 MED ORDER — TACROLIMUS 1 MG PO CAPS
1.00 | ORAL_CAPSULE | ORAL | Status: DC
Start: 2020-01-19 — End: 2020-01-18

## 2020-01-18 MED ORDER — ENOXAPARIN SODIUM 40 MG/0.4ML ~~LOC~~ SOLN
40.00 | SUBCUTANEOUS | Status: DC
Start: 2020-01-20 — End: 2020-01-18

## 2020-01-18 MED ORDER — Medication
3.00 | Status: DC
Start: 2020-01-18 — End: 2020-01-18

## 2020-01-18 MED ORDER — EQL PEDIATRIC ELECTROLYTE PO SOLN
0.40 | ORAL | Status: DC
Start: 2020-01-19 — End: 2020-01-18

## 2020-01-18 MED ORDER — SODIUM BICARBONATE 650 MG PO TABS
1300.00 | ORAL_TABLET | ORAL | Status: DC
Start: 2020-01-19 — End: 2020-01-18

## 2020-01-18 MED ORDER — GENERIC EXTERNAL MEDICATION
540.00 | Status: DC
Start: 2020-01-19 — End: 2020-01-18

## 2020-01-18 MED ORDER — MELATONIN 3 MG PO TABS
6.00 | ORAL_TABLET | ORAL | Status: DC
Start: ? — End: 2020-01-18

## 2020-01-21 MED ORDER — MAGNESIUM CHLORIDE 64 MG PO TBEC
128.00 | DELAYED_RELEASE_TABLET | ORAL | Status: DC
Start: 2020-01-19 — End: 2020-01-21

## 2020-01-21 MED ORDER — GENERIC EXTERNAL MEDICATION
20.00 | Status: DC
Start: 2020-01-20 — End: 2020-01-21

## 2020-01-22 ENCOUNTER — Ambulatory Visit: Payer: Medicare Other | Admitting: Sports Medicine

## 2020-02-04 ENCOUNTER — Ambulatory Visit: Payer: Medicare Other | Attending: Internal Medicine

## 2020-02-04 DIAGNOSIS — Z23 Encounter for immunization: Secondary | ICD-10-CM

## 2020-02-04 NOTE — Progress Notes (Signed)
   Covid-19 Vaccination Clinic  Name:  Theodore Arellano    MRN: [id number] DOB: 04/30/1961  02/04/2020  [name] was observed post Covid-19 immunization for 15 minutes without incident. He was provided with Vaccine Information Sheet and instruction to access the V-Safe system.   [name] was instructed to call 911 with any severe reactions post vaccine: Marland Kitchen Difficulty breathing  . Swelling of face and throat  . A fast heartbeat  . A bad rash all over body  . Dizziness and weakness   Immunizations Administered    Name Date Dose VIS Date Route   Pfizer COVID-19 Vaccine 02/04/2020  1:06 PM 0.3 mL 11/25/2018 Intramuscular   Manufacturer: [place]   Lot: [id number]   [place]: [address]-3

## 2020-03-01 ENCOUNTER — Ambulatory Visit: Payer: Medicare Other | Attending: Internal Medicine

## 2020-03-01 DIAGNOSIS — Z23 Encounter for immunization: Secondary | ICD-10-CM

## 2020-03-01 NOTE — Progress Notes (Signed)
   Covid-19 Vaccination Clinic  Name:  Theodore Arellano    MRN: [id number] DOB: [birth year]  03/01/2020  [name] was observed post Covid-19 immunization for 15 minutes without incident. He was provided with Vaccine Information Sheet and instruction to access the V-Safe system.   [name] was instructed to call 911 with any severe reactions post vaccine: Marland Kitchen Difficulty breathing  . Swelling of face and throat  . A fast heartbeat  . A bad rash all over body  . Dizziness and weakness   Immunizations Administered    Name Date Dose VIS Date Route   Pfizer COVID-19 Vaccine 03/01/2020 10:55 AM 0.3 mL 11/25/2018 Intramuscular   Manufacturer: [place]   Lot: [id number]   [place]: [address]-6

## 2020-09-10 ENCOUNTER — Other Ambulatory Visit: Payer: Self-pay

## 2020-09-10 ENCOUNTER — Emergency Department (HOSPITAL_COMMUNITY)
Admission: EM | Admit: 2020-09-10 | Discharge: 2020-09-11 | Disposition: A | Discharge status: Home / Self Care | Payer: Medicare Other | Attending: Emergency Medicine | Admitting: Emergency Medicine

## 2020-09-10 DIAGNOSIS — Z8616 Personal history of COVID-19: Secondary | ICD-10-CM | POA: Insufficient documentation

## 2020-09-10 DIAGNOSIS — I120 Hypertensive chronic kidney disease with stage 5 chronic kidney disease or end stage renal disease: Secondary | ICD-10-CM | POA: Insufficient documentation

## 2020-09-10 DIAGNOSIS — R110 Nausea: Secondary | ICD-10-CM | POA: Insufficient documentation

## 2020-09-10 DIAGNOSIS — K5900 Constipation, unspecified: Secondary | ICD-10-CM

## 2020-09-10 DIAGNOSIS — N186 End stage renal disease: Secondary | ICD-10-CM | POA: Diagnosis not present

## 2020-09-10 DIAGNOSIS — Z794 Long term (current) use of insulin: Secondary | ICD-10-CM | POA: Diagnosis not present

## 2020-09-10 DIAGNOSIS — Z951 Presence of aortocoronary bypass graft: Secondary | ICD-10-CM | POA: Diagnosis not present

## 2020-09-10 DIAGNOSIS — F17210 Nicotine dependence, cigarettes, uncomplicated: Secondary | ICD-10-CM | POA: Diagnosis not present

## 2020-09-10 DIAGNOSIS — Z992 Dependence on renal dialysis: Secondary | ICD-10-CM | POA: Insufficient documentation

## 2020-09-10 DIAGNOSIS — I2510 Atherosclerotic heart disease of native coronary artery without angina pectoris: Secondary | ICD-10-CM | POA: Insufficient documentation

## 2020-09-10 DIAGNOSIS — R143 Flatulence: Secondary | ICD-10-CM | POA: Diagnosis not present

## 2020-09-10 DIAGNOSIS — H5711 Ocular pain, right eye: Secondary | ICD-10-CM | POA: Diagnosis present

## 2020-09-10 DIAGNOSIS — R519 Headache, unspecified: Secondary | ICD-10-CM | POA: Diagnosis not present

## 2020-09-10 DIAGNOSIS — E1122 Type 2 diabetes mellitus with diabetic chronic kidney disease: Secondary | ICD-10-CM | POA: Diagnosis not present

## 2020-09-10 DIAGNOSIS — Z79899 Other long term (current) drug therapy: Secondary | ICD-10-CM | POA: Diagnosis not present

## 2020-09-10 DIAGNOSIS — Z7982 Long term (current) use of aspirin: Secondary | ICD-10-CM | POA: Insufficient documentation

## 2020-09-10 MED ORDER — IOHEXOL 9 MG/ML PO SOLN
ORAL | Status: AC
  Administered 2020-09-11: 500 mL
  Filled 2020-09-10: qty 500

## 2020-09-10 MED ORDER — TETRACAINE HCL 0.5 % OP SOLN
2.0000 [drp] | Freq: Once | OPHTHALMIC | Status: AC
  Administered 2020-09-11: 2 [drp] via OPHTHALMIC
  Filled 2020-09-10: qty 4

## 2020-09-10 MED ORDER — FLUORESCEIN SODIUM 1 MG OP STRP
1.0000 | ORAL_STRIP | Freq: Once | OPHTHALMIC | Status: AC
  Administered 2020-09-11: 1 via OPHTHALMIC
  Filled 2020-09-10: qty 1
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
## 2020-09-11 ENCOUNTER — Emergency Department (HOSPITAL_COMMUNITY): Payer: Medicare Other

## 2020-09-11 DIAGNOSIS — H5711 Ocular pain, right eye: Secondary | ICD-10-CM | POA: Diagnosis not present

## 2020-09-11 LAB — CBC WITH DIFFERENTIAL/PLATELET
Abs Immature Granulocytes: 0.02 10*3/uL (ref 0.00–0.07)
Basophils Absolute: 0 10*3/uL (ref 0.0–0.1)
Basophils Relative: 1 %
Eosinophils Absolute: 0.1 10*3/uL (ref 0.0–0.5)
Eosinophils Relative: 2 %
HCT: 53 % — ABNORMAL HIGH (ref 39.0–52.0)
Hemoglobin: 16.2 g/dL (ref 13.0–17.0)
Immature Granulocytes: 0 %
Lymphocytes Relative: 28 %
Lymphs Abs: 1.8 10*3/uL (ref 0.7–4.0)
MCH: 25.9 pg — ABNORMAL LOW (ref 26.0–34.0)
MCHC: 30.6 g/dL (ref 30.0–36.0)
MCV: 84.8 fL (ref 80.0–100.0)
Monocytes Absolute: 0.6 10*3/uL (ref 0.1–1.0)
Monocytes Relative: 9 %
Neutro Abs: 3.9 10*3/uL (ref 1.7–7.7)
Neutrophils Relative %: 60 %
Platelets: 277 10*3/uL (ref 150–400)
RBC: 6.25 MIL/uL — ABNORMAL HIGH (ref 4.22–5.81)
RDW: 18.5 % — ABNORMAL HIGH (ref 11.5–15.5)
WBC: 6.5 10*3/uL (ref 4.0–10.5)
nRBC: 0 % (ref 0.0–0.2)

## 2020-09-11 LAB — COMPREHENSIVE METABOLIC PANEL
ALT: 12 U/L (ref 0–44)
AST: 17 U/L (ref 15–41)
Albumin: 3.8 g/dL (ref 3.5–5.0)
Alkaline Phosphatase: 94 U/L (ref 38–126)
Anion gap: 13 (ref 5–15)
BUN: 11 mg/dL (ref 6–20)
CO2: 24 mmol/L (ref 22–32)
Calcium: 9.8 mg/dL (ref 8.9–10.3)
Chloride: 99 mmol/L (ref 98–111)
Creatinine, Ser: 1.37 mg/dL — ABNORMAL HIGH (ref 0.61–1.24)
GFR, Estimated: 59 mL/min — ABNORMAL LOW (ref >60)
Glucose, Bld: 107 mg/dL — ABNORMAL HIGH (ref 70–99)
Potassium: 4.1 mmol/L (ref 3.5–5.1)
Sodium: 136 mmol/L (ref 135–145)
Total Bilirubin: 0.7 mg/dL (ref 0.3–1.2)
Total Protein: 8.9 g/dL — ABNORMAL HIGH (ref 6.5–8.1)

## 2020-09-11 LAB — LIPASE, BLOOD: Lipase: 19 U/L (ref 11–51)

## 2020-09-11 MED ORDER — ACETAMINOPHEN 325 MG PO TABS
650.0000 mg | ORAL_TABLET | Freq: Once | ORAL | Status: AC
  Administered 2020-09-11: 650 mg via ORAL
  Filled 2020-09-11: qty 2
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
## 2020-09-11 NOTE — ED Notes (Signed)
RN aware of pts BP.  

## 2020-09-11 NOTE — ED Notes (Signed)
Pt ambulatory to [facility]

## 2020-10-27 IMAGING — DX PORTABLE CHEST - 1 VIEW
1 series · 1 of 1 positions shown · non-contrast
Comparison: 11/27/2017

CLINICAL DATA: Chest pain

EXAM:
PORTABLE CHEST 1 VIEW

[chest ap]
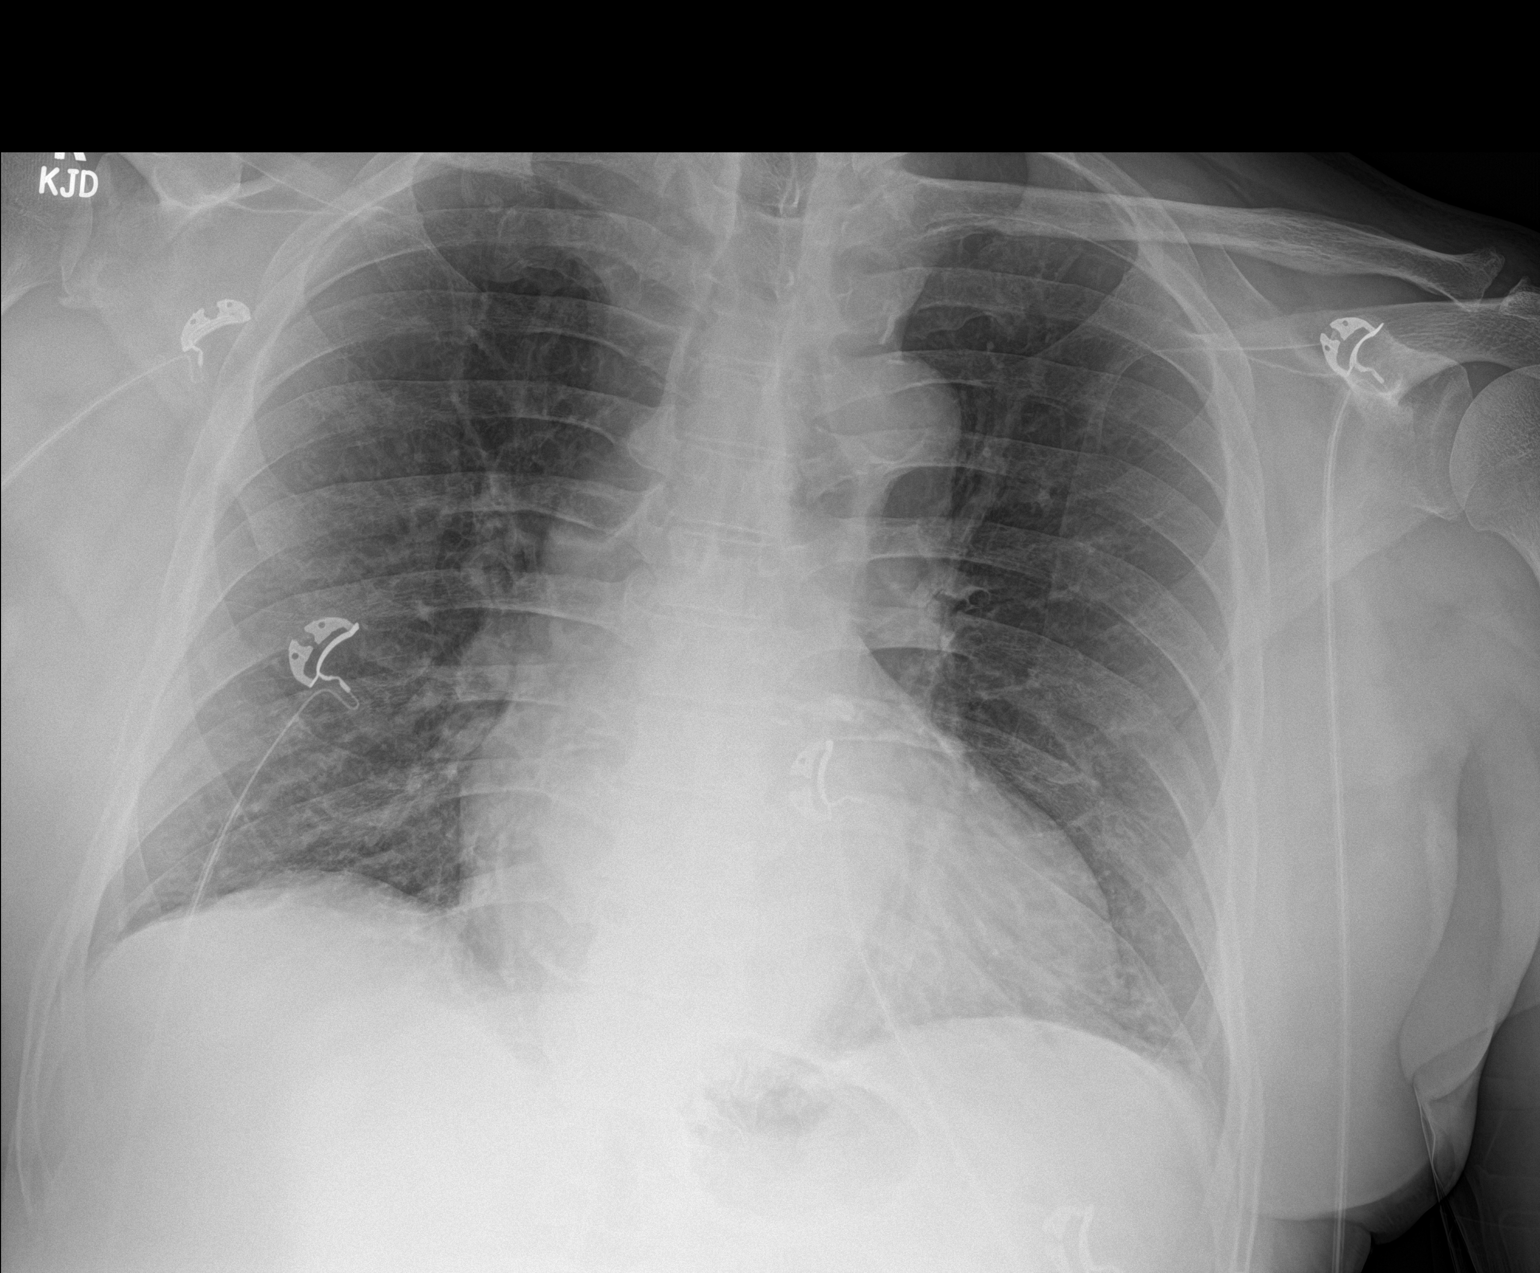

[1 of 1 positions shown; findings below may reference images not displayed]

FINDINGS: The heart size and mediastinal contours are within normal limits.
Both lungs are clear. The visualized skeletal structures are
unremarkable.
IMPRESSION: No active disease.

## 2020-12-04 ENCOUNTER — Emergency Department (HOSPITAL_COMMUNITY): Payer: Medicare Other

## 2020-12-04 ENCOUNTER — Emergency Department (HOSPITAL_COMMUNITY)
Admission: EM | Admit: 2020-12-04 | Discharge: 2020-12-06 | Disposition: A | Discharge status: Home / Self Care | Payer: Medicare Other | Attending: Emergency Medicine | Admitting: Emergency Medicine

## 2020-12-04 ENCOUNTER — Encounter (HOSPITAL_COMMUNITY): Payer: Self-pay

## 2020-12-04 DIAGNOSIS — Z20822 Contact with and (suspected) exposure to covid-19: Secondary | ICD-10-CM | POA: Diagnosis not present

## 2020-12-04 DIAGNOSIS — Z8616 Personal history of COVID-19: Secondary | ICD-10-CM | POA: Insufficient documentation

## 2020-12-04 DIAGNOSIS — Z951 Presence of aortocoronary bypass graft: Secondary | ICD-10-CM | POA: Diagnosis not present

## 2020-12-04 DIAGNOSIS — R451 Restlessness and agitation: Secondary | ICD-10-CM | POA: Diagnosis not present

## 2020-12-04 DIAGNOSIS — E1122 Type 2 diabetes mellitus with diabetic chronic kidney disease: Secondary | ICD-10-CM | POA: Diagnosis not present

## 2020-12-04 DIAGNOSIS — N186 End stage renal disease: Secondary | ICD-10-CM | POA: Diagnosis not present

## 2020-12-04 DIAGNOSIS — F1994 Other psychoactive substance use, unspecified with psychoactive substance-induced mood disorder: Secondary | ICD-10-CM

## 2020-12-04 DIAGNOSIS — I2510 Atherosclerotic heart disease of native coronary artery without angina pectoris: Secondary | ICD-10-CM | POA: Diagnosis not present

## 2020-12-04 DIAGNOSIS — F1924 Other psychoactive substance dependence with psychoactive substance-induced mood disorder: Secondary | ICD-10-CM | POA: Diagnosis not present

## 2020-12-04 DIAGNOSIS — I160 Hypertensive urgency: Secondary | ICD-10-CM | POA: Insufficient documentation

## 2020-12-04 DIAGNOSIS — Z794 Long term (current) use of insulin: Secondary | ICD-10-CM | POA: Diagnosis not present

## 2020-12-04 DIAGNOSIS — I120 Hypertensive chronic kidney disease with stage 5 chronic kidney disease or end stage renal disease: Secondary | ICD-10-CM | POA: Diagnosis not present

## 2020-12-04 DIAGNOSIS — F17210 Nicotine dependence, cigarettes, uncomplicated: Secondary | ICD-10-CM | POA: Diagnosis not present

## 2020-12-04 DIAGNOSIS — Z79899 Other long term (current) drug therapy: Secondary | ICD-10-CM | POA: Insufficient documentation

## 2020-12-04 DIAGNOSIS — Z7901 Long term (current) use of anticoagulants: Secondary | ICD-10-CM | POA: Insufficient documentation

## 2020-12-04 DIAGNOSIS — Z7982 Long term (current) use of aspirin: Secondary | ICD-10-CM | POA: Diagnosis not present

## 2020-12-04 DIAGNOSIS — R410 Disorientation, unspecified: Secondary | ICD-10-CM | POA: Diagnosis not present

## 2020-12-04 DIAGNOSIS — R4182 Altered mental status, unspecified: Secondary | ICD-10-CM | POA: Diagnosis present

## 2020-12-04 DIAGNOSIS — F19959 Other psychoactive substance use, unspecified with psychoactive substance-induced psychotic disorder, unspecified: Secondary | ICD-10-CM

## 2020-12-04 DIAGNOSIS — I10 Essential (primary) hypertension: Secondary | ICD-10-CM

## 2020-12-04 DIAGNOSIS — F1410 Cocaine abuse, uncomplicated: Secondary | ICD-10-CM

## 2020-12-04 LAB — URINALYSIS, COMPLETE (UACMP) WITH MICROSCOPIC
Bacteria, UA: NONE SEEN
Bilirubin Urine: NEGATIVE
Glucose, UA: NEGATIVE mg/dL
Hgb urine dipstick: NEGATIVE
Ketones, ur: 5 mg/dL — AB
Leukocytes,Ua: NEGATIVE
Nitrite: NEGATIVE
Protein, ur: >=300 mg/dL — AB
Specific Gravity, Urine: 1.015 (ref 1.005–1.030)
pH: 6 (ref 5.0–8.0)

## 2020-12-04 LAB — ACETAMINOPHEN LEVEL: Acetaminophen (Tylenol), Serum: <10 ug/mL — ABNORMAL LOW (ref 10–30)

## 2020-12-04 LAB — RAPID URINE DRUG SCREEN, HOSP PERFORMED
Amphetamines: NOT DETECTED
Barbiturates: NOT DETECTED
Benzodiazepines: NOT DETECTED
Cocaine: POSITIVE — AB
Opiates: NOT DETECTED
Tetrahydrocannabinol: NOT DETECTED

## 2020-12-04 LAB — CBC WITH DIFFERENTIAL/PLATELET
Abs Immature Granulocytes: 0.03 10*3/uL (ref 0.00–0.07)
Basophils Absolute: 0 10*3/uL (ref 0.0–0.1)
Basophils Relative: 1 %
Eosinophils Absolute: 0.1 10*3/uL (ref 0.0–0.5)
Eosinophils Relative: 2 %
HCT: 51.6 % (ref 39.0–52.0)
Hemoglobin: 16.5 g/dL (ref 13.0–17.0)
Immature Granulocytes: 0 %
Lymphocytes Relative: 16 %
Lymphs Abs: 1.3 10*3/uL (ref 0.7–4.0)
MCH: 27.7 pg (ref 26.0–34.0)
MCHC: 32 g/dL (ref 30.0–36.0)
MCV: 86.7 fL (ref 80.0–100.0)
Monocytes Absolute: 0.8 10*3/uL (ref 0.1–1.0)
Monocytes Relative: 10 %
Neutro Abs: 5.7 10*3/uL (ref 1.7–7.7)
Neutrophils Relative %: 71 %
Platelets: 245 10*3/uL (ref 150–400)
RBC: 5.95 MIL/uL — ABNORMAL HIGH (ref 4.22–5.81)
RDW: 16 % — ABNORMAL HIGH (ref 11.5–15.5)
WBC: 8 10*3/uL (ref 4.0–10.5)
nRBC: 0 % (ref 0.0–0.2)

## 2020-12-04 LAB — COMPREHENSIVE METABOLIC PANEL
ALT: 14 U/L (ref 0–44)
AST: 22 U/L (ref 15–41)
Albumin: 3.6 g/dL (ref 3.5–5.0)
Alkaline Phosphatase: 92 U/L (ref 38–126)
Anion gap: 14 (ref 5–15)
BUN: 13 mg/dL (ref 6–20)
CO2: 22 mmol/L (ref 22–32)
Calcium: 9.8 mg/dL (ref 8.9–10.3)
Chloride: 102 mmol/L (ref 98–111)
Creatinine, Ser: 1.55 mg/dL — ABNORMAL HIGH (ref 0.61–1.24)
GFR, Estimated: 51 mL/min — ABNORMAL LOW (ref >60)
Glucose, Bld: 105 mg/dL — ABNORMAL HIGH (ref 70–99)
Potassium: 3.8 mmol/L (ref 3.5–5.1)
Sodium: 138 mmol/L (ref 135–145)
Total Bilirubin: 1.3 mg/dL — ABNORMAL HIGH (ref 0.3–1.2)
Total Protein: 8.4 g/dL — ABNORMAL HIGH (ref 6.5–8.1)

## 2020-12-04 LAB — SALICYLATE LEVEL: Salicylate Lvl: <7 mg/dL — ABNORMAL LOW (ref 7.0–30.0)

## 2020-12-04 LAB — AMMONIA: Ammonia: 23 umol/L (ref 9–35)

## 2020-12-04 LAB — RESP PANEL BY RT-PCR (FLU A&B, COVID) ARPGX2
Influenza A by PCR: NEGATIVE
Influenza B by PCR: NEGATIVE
SARS Coronavirus 2 by RT PCR: NEGATIVE

## 2020-12-04 LAB — CBG MONITORING, ED: Glucose-Capillary: 107 mg/dL — ABNORMAL HIGH (ref 70–99)

## 2020-12-04 LAB — ETHANOL: Alcohol, Ethyl (B): <10 mg/dL (ref <10)

## 2020-12-04 MED ORDER — LORAZEPAM 2 MG/ML IJ SOLN: 0.5000 mg | INTRAMUSCULAR | Status: DC | PRN

## 2020-12-04 MED ORDER — TACROLIMUS 1 MG PO CAPS
2.0000 mg | ORAL_CAPSULE | Freq: Every day | ORAL | Status: DC
  Administered 2020-12-05: 2 mg via ORAL
  Filled 2020-12-04 (×3): qty 2

## 2020-12-04 MED ORDER — LORAZEPAM 2 MG/ML IJ SOLN
1.0000 mg | Freq: Once | INTRAMUSCULAR | Status: AC
  Administered 2020-12-04: 1 mg via INTRAVENOUS
  Filled 2020-12-04: qty 1

## 2020-12-04 MED ORDER — GABAPENTIN 100 MG PO CAPS
100.0000 mg | ORAL_CAPSULE | Freq: Two times a day (BID) | ORAL | Status: DC
  Administered 2020-12-05 – 2020-12-06 (×3): 100 mg via ORAL
  Filled 2020-12-04 (×3): qty 1

## 2020-12-04 MED ORDER — RIVAROXABAN 2.5 MG PO TABS
2.5000 mg | ORAL_TABLET | Freq: Two times a day (BID) | ORAL | Status: DC
  Administered 2020-12-05 – 2020-12-06 (×3): 2.5 mg via ORAL
  Filled 2020-12-04 (×5): qty 1

## 2020-12-04 MED ORDER — AMOXICILLIN-POT CLAVULANATE 875-125 MG PO TABS
1.0000 | ORAL_TABLET | Freq: Two times a day (BID) | ORAL | Status: DC
  Administered 2020-12-05 – 2020-12-06 (×3): 1 via ORAL
  Filled 2020-12-04 (×3): qty 1

## 2020-12-04 MED ORDER — SODIUM BICARBONATE 650 MG PO TABS
1300.0000 mg | ORAL_TABLET | Freq: Two times a day (BID) | ORAL | Status: DC
  Administered 2020-12-05 – 2020-12-06 (×3): 1300 mg via ORAL
  Filled 2020-12-04 (×5): qty 2

## 2020-12-04 MED ORDER — PANTOPRAZOLE SODIUM 40 MG PO TBEC
40.0000 mg | DELAYED_RELEASE_TABLET | Freq: Every day | ORAL | Status: DC
  Administered 2020-12-05 – 2020-12-06 (×2): 40 mg via ORAL
  Filled 2020-12-04 (×2): qty 1

## 2020-12-04 MED ORDER — METOPROLOL TARTRATE 25 MG PO TABS
25.0000 mg | ORAL_TABLET | Freq: Two times a day (BID) | ORAL | Status: DC
  Administered 2020-12-05 – 2020-12-06 (×3): 25 mg via ORAL
  Filled 2020-12-04 (×3): qty 1

## 2020-12-04 MED ORDER — PREDNISONE 5 MG PO TABS
5.0000 mg | ORAL_TABLET | Freq: Every day | ORAL | Status: DC
  Administered 2020-12-06: 5 mg via ORAL
  Filled 2020-12-04 (×3): qty 1

## 2020-12-04 MED ORDER — MAGNESIUM OXIDE 400 (241.3 MG) MG PO TABS
400.0000 mg | ORAL_TABLET | Freq: Two times a day (BID) | ORAL | Status: DC
  Administered 2020-12-05 – 2020-12-06 (×3): 400 mg via ORAL
  Filled 2020-12-04 (×3): qty 1

## 2020-12-04 MED ORDER — RANOLAZINE ER 500 MG PO TB12
500.0000 mg | ORAL_TABLET | Freq: Two times a day (BID) | ORAL | Status: DC
  Administered 2020-12-05 – 2020-12-06 (×3): 500 mg via ORAL
  Filled 2020-12-04 (×5): qty 1

## 2020-12-04 MED ORDER — HYDROXYZINE PAMOATE 25 MG PO CAPS
25.0000 mg | ORAL_CAPSULE | Freq: Three times a day (TID) | ORAL | Status: DC | PRN
  Filled 2020-12-04: qty 1

## 2020-12-04 MED ORDER — MYCOPHENOLATE SODIUM 180 MG PO TBEC
720.0000 mg | DELAYED_RELEASE_TABLET | Freq: Two times a day (BID) | ORAL | Status: DC
  Administered 2020-12-05 – 2020-12-06 (×3): 720 mg via ORAL
  Filled 2020-12-04 (×5): qty 4

## 2020-12-04 MED ORDER — TACROLIMUS 1 MG PO CAPS
3.0000 mg | ORAL_CAPSULE | Freq: Every day | ORAL | Status: DC
  Administered 2020-12-05 – 2020-12-06 (×2): 3 mg via ORAL
  Filled 2020-12-04 (×2): qty 3

## 2020-12-04 MED ORDER — HYDROMORPHONE HCL 2 MG PO TABS
4.0000 mg | ORAL_TABLET | Freq: Three times a day (TID) | ORAL | Status: DC | PRN
  Administered 2020-12-05 (×3): 4 mg via ORAL
  Filled 2020-12-04 (×3): qty 2

## 2020-12-04 MED ORDER — METOPROLOL TARTRATE 25 MG PO TABS
25.0000 mg | ORAL_TABLET | Freq: Once | ORAL | Status: AC
  Administered 2020-12-04: 25 mg via ORAL
  Filled 2020-12-04: qty 1

## 2020-12-04 MED ORDER — ACETAMINOPHEN 500 MG PO TABS
500.0000 mg | ORAL_TABLET | Freq: Four times a day (QID) | ORAL | Status: DC | PRN
  Administered 2020-12-05: 500 mg via ORAL
  Filled 2020-12-04: qty 1

## 2020-12-04 NOTE — ED Notes (Addendum)
Patient refusing to keep pulse ox/cardiac leads on

## 2020-12-04 NOTE — ED Notes (Signed)
Pt's daughter would like an update on pt please [phone]
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
## 2020-12-04 NOTE — ED Provider Notes (Signed)
[facility] EMERGENCY DEPARTMENT Provider Note   CSN: [id number] Arrival date & time: 12/04/20  1240     History Chief Complaint  Patient presents with  . Altered Mental Status    Theodore Arellano is a 60 y.o. male with a past medical history of CAD, hypertension, hyperlipidemia, kidney disease status post kidney transplant in 2011, arthritis with chronic lower back pain presenting to the ED with altered mental status.  History provided by EMS at the bedside.  States that neighbor had called due to patient "not acting like himself."  They are unsure how long this has been going on.  They tried to contact patient's family but they did not have any information about the patient's current status.  Level 5 caveat secondary to altered mental status.  HPI     Past Medical History:  Diagnosis Date  . Anxiety   . Arthritis    "lower back" (11/28/2017)  . CAD (coronary artery disease)   . Chronic lower back pain   . H/O immunosuppressive therapy    chronic/notes 11/28/2017  . History of gout    "before kidney transport" (11/28/2017)  . Hyperlipidemia   . Hypertension   . Kidney disease    s/p kidney transplant 2011; "not on dialysis now" (11/28/2017)    Patient Active Problem List   Diagnosis Date Noted  . Cellulitis of left lower extremity 01/11/2020  . Sepsis ([place]) 01/11/2020  . DM (diabetes mellitus) type II, controlled, with peripheral vascular disorder ([place]) 01/11/2020  . Chronic pain syndrome 01/11/2020  . Sepsis due to cellulitis ([place]) 01/11/2020  . ESRD (end stage renal disease) ([place]) 01/06/2020  . Acute kidney injury ([place]) 11/08/2019  . COVID-19 virus infection 11/08/2019  . History of DVT (deep vein thrombosis) 11/08/2019  . Hx of CABG 09/04/2019  . Disorder of arteries and arterioles, unspecified ([place]) 07/21/2019  . Wound of left leg 07/21/2019  . Left leg pain 06/28/2019  . Abnormal ECG 06/05/2019  . Mobitz type I Wenckebach atrioventricular block  06/05/2019  . Unstable angina pectoris due to coronary arteriosclerosis ([place]) 05/30/2019  . Left ventricular dysfunction 01/08/2018  . Coronary artery disease involving native coronary artery of native heart without angina pectoris   . Coronary artery disease due to lipid rich plaque   . Elevated troponin   . Tobacco use 09/02/2017  . Chest pain 08/27/2016  . Renal transplant recipient 08/27/2016  . Essential hypertension 08/27/2016  . HLD (hyperlipidemia) 08/27/2016  . Type 2 diabetes mellitus with vascular disease ([place]) 08/27/2016  . Abnormal nuclear stress test 11/02/2015  . CKD (chronic kidney disease) 11/02/2015  . Current smoker 11/02/2015  . Dyslipidemia 11/02/2015  . Immunosuppression ([place]) 09/02/2014  . Rash 09/02/2014  . Left groin pain 06/24/2013  . Anemia 05/21/2012  . Leucopenia 05/21/2012  . Personal history of immunosupression therapy 05/21/2012    Past Surgical History:  Procedure Laterality Date  . ARTERIOVENOUS GRAFT PLACEMENT Right 04/2007   Archie Endo 02/01/2011  . AV FISTULA PLACEMENT Left 12/20/2005; 12/2006   Archie Endo 5/15/2012Marland Kitchen Archie Endo 02/13/2011  . CARDIAC CATHETERIZATION    . HD access procedures    . INGUINAL HERNIA REPAIR Left   . KIDNEY TRANSPLANT  2011  . LEFT HEART CATH AND CORONARY ANGIOGRAPHY N/A 11/29/2017   Procedure: LEFT HEART CATH AND CORONARY ANGIOGRAPHY;  Surgeon: Leonie Man, MD;  Location: [facility] [facility] CV LAB;  Service: Cardiovascular;  Laterality: N/A;  . LEFT HEART CATH AND CORONARY ANGIOGRAPHY N/A 06/01/2019  Procedure: LEFT HEART CATH AND CORONARY ANGIOGRAPHY;  Surgeon: Burnell Blanks, MD;  Location: [facility] CV LAB;  Service: Cardiovascular;  Laterality: N/A;  . LEFT HEART CATHETERIZATION WITH CORONARY ANGIOGRAM  03/11/2012   Procedure: LEFT HEART CATHETERIZATION WITH CORONARY ANGIOGRAM;  Surgeon: Jolaine Artist, MD;  Location: [facility] CATH LAB;  Service: Cardiovascular;;  . RIGHT HEART CATHETERIZATION N/A 03/11/2012   Procedure:  RIGHT HEART CATH;  Surgeon: Jolaine Artist, MD;  Location: [facility] CATH LAB;  Service: Cardiovascular;  Laterality: N/A;  . THROMBECTOMY Right 12/2007   Archie Endo 02/01/2011  . THROMBECTOMY / ARTERIOVENOUS GRAFT REVISION Left 12/2006   Archie Endo 02/13/2011  . THROMBECTOMY / ARTERIOVENOUS GRAFT REVISION Right 07/2007; 10/2007;01/2008;   Archie Endo 01/31/2011; Archie Endo 02/01/2011; Archie Endo 01/31/2011  . THROMBECTOMY AND REVISION OF ARTERIOVENTOUS (AV) GORETEX  GRAFT  03/2007 X 2   /notes 02/01/2011       Family History  Problem Relation Age of Onset  . Hypertension Father   . Diabetes Mother     Social History   Tobacco Use  . Smoking status: Current Every Day Smoker    Packs/day: 0.50    Years: 40.00    Pack years: 20.00    Types: Cigarettes  . Smokeless tobacco: Never Used  Vaping Use  . Vaping Use: Never used  Substance Use Topics  . Alcohol use: Yes    Alcohol/week: 12.0 standard drinks    Types: 12 Cans of beer per week  . Drug use: No    Home Medications Prior to Admission medications   Medication Sig Start Date End Date Taking? Authorizing Provider  aspirin EC 81 MG tablet Take 81 mg by mouth daily.    [provider]  atorvastatin (LIPITOR) 80 MG tablet Take 80 mg by mouth at bedtime. 09/13/19   [provider]  atropine 1 % ophthalmic solution Place 1 drop into the right eye daily. 09/14/20   [provider]  blood glucose meter kit and supplies KIT Dispense based on patient and insurance preference. Use up to four times daily as directed. (FOR ICD-9 250.00, 250.01). 11/11/19   Nita Sells, MD  brimonidine (ALPHAGAN) 0.2 % ophthalmic solution Place 1 drop into the right eye 3 (three) times daily. 11/10/20   [provider]  colchicine 0.6 MG tablet Take 0.6 mg by mouth daily as needed (for gout).     [provider]  dorzolamide-timolol (COSOPT) 22.3-6.8 MG/ML ophthalmic solution Place 1 drop into the right eye 3 (three) times daily.  11/14/20   [provider]  enoxaparin (LOVENOX) 80 MG/0.8ML injection Inject 0.8 mLs (80 mg total) into the skin every 12 (twelve) hours. Patient not taking: No sig reported 01/13/20   Alma Friendly, MD  gabapentin (NEURONTIN) 100 MG capsule Take 200 mg by mouth 3 (three) times daily as needed (pain). 10/30/19   [provider]  HYDROmorphone (DILAUDID) 4 MG tablet Take 4 mg by mouth 3 (three) times daily as needed for pain. 01/10/20   [provider]  hydrOXYzine (VISTARIL) 25 MG capsule Take 25 mg by mouth 3 (three) times daily as needed. 11/15/20   [provider]  insulin aspart (NOVOLOG) 100 UNIT/ML injection Inject 0-9 Units into the skin 4 (four) times daily -  before meals and at bedtime. Patient not taking: Reported on 09/11/2020 01/13/20   Alma Friendly, MD  isosorbide mononitrate (IMDUR) 30 MG 24 hr tablet Take 2 tablets (60 mg total) by mouth daily. Patient not taking: Reported  on 09/10/2020 11/11/19   Nita Sells, MD  magnesium oxide (MAG-OX) 400 MG tablet Take 1 tablet by mouth 2 (two) times daily. 09/08/20 09/08/21  [provider]  metoprolol tartrate (LOPRESSOR) 25 MG tablet Take 12.5 mg by mouth 2 (two) times daily.  10/30/19   [provider]  mycophenolate (MYFORTIC) 360 MG TBEC EC tablet Take 720 mg by mouth 2 (two) times daily.    [provider]  NARCAN 4 MG/0.1ML LIQD nasal spray kit Place 1 spray into the nose as needed (overdose). 12/02/19   [provider]  nicotine (NICODERM CQ - DOSED IN MG/24 HOURS) 14 mg/24hr patch Place 1 patch (14 mg total) onto the skin daily. 01/14/20   Alma Friendly, MD  pantoprazole (PROTONIX) 40 MG tablet Take 40 mg by mouth daily. 11/02/19   [provider]  prednisoLONE acetate (PRED FORTE) 1 % ophthalmic suspension Place 1 drop into the right eye 3 (three) times daily. 09/13/20   [provider]  predniSONE (DELTASONE) 5 MG tablet Take 5  mg by mouth daily. 12/07/19   [provider]  ranolazine (RANEXA) 500 MG 12 hr tablet Take 500 mg by mouth 2 (two) times daily. 01/04/20   [provider]  rivaroxaban (XARELTO) 2.5 MG TABS tablet Take 1 tablet by mouth 2 (two) times daily. 06/10/20   [provider]  sodium bicarbonate 650 MG tablet Take 1,300 mg by mouth 2 (two) times daily.    [provider]  tacrolimus (PROGRAF) 1 MG capsule Take 2-3 mg by mouth See admin instructions. Take 39m in the morning and 221mat night, 12 hours apart.    [provider]  tamsulosin (FLOMAX) 0.4 MG CAPS capsule Take 0.4 mg by mouth at bedtime. 08/28/19   [provider]  trimethoprim-polymyxin b (POLYTRIM) ophthalmic solution Place 1 drop into the right eye in the morning and at bedtime. 09/05/20   [provider]    Allergies    Percocet [oxycodone-acetaminophen], Vicodin [hydrocodone-acetaminophen], Shellfish allergy, Chocolate flavor, Chocolate, and Tomato  Review of Systems   Review of Systems  Unable to perform ROS: Mental status change    Physical Exam Updated Vital Signs BP (!) 206/84   Pulse 75   Temp 98 F (36.7 C) (Rectal)   Resp 20   SpO2 95%   Physical Exam Vitals and nursing note reviewed.  Constitutional:      General: He is not in acute distress.    Appearance: He is well-developed and well-nourished.     Comments: Intermittently agitated.  Appears to moving all extremities without difficulty.  HENT:     Head: Normocephalic and atraumatic.     Nose: Nose normal.  Eyes:     General: No scleral icterus.       Right eye: No discharge.        Left eye: No discharge.     Extraocular Movements: EOM normal.     Conjunctiva/sclera: Conjunctivae normal.     Comments: ?cataract R eye. L pupil reactive.  Cardiovascular:     Rate and Rhythm: Normal rate and regular rhythm.     Pulses: Intact distal pulses.     Heart sounds: Normal heart sounds. No murmur heard. No  friction rub. No gallop.   Pulmonary:     Effort: Pulmonary effort is normal. No respiratory distress.     Breath sounds: Normal breath sounds.  Abdominal:     General: Bowel sounds are normal. There is no distension.  Palpations: Abdomen is soft.     Tenderness: There is no abdominal tenderness. There is no guarding.  Musculoskeletal:        General: No edema. Normal range of motion.     Cervical back: Normal range of motion and neck supple.  Skin:    General: Skin is warm and dry.     Findings: Wound present. No rash.     Comments: Chronic appearing wounds to L shin.   Neurological:     Mental Status: He is alert.     Sensory: No sensory deficit.     Motor: No abnormal muscle tone.     Coordination: Coordination normal.     Comments: Unable to assess strength and sensation.  Psychiatric:        Mood and Affect: Mood and affect normal.        Behavior: Behavior is uncooperative and agitated.     ED Results / Procedures / Treatments   Labs (all labs ordered are listed, but only abnormal results are displayed) Labs Reviewed  COMPREHENSIVE METABOLIC PANEL - Abnormal; Notable for the following components:      Result Value   Glucose, Bld 105 (*)    Creatinine, Ser 1.55 (*)    Total Protein 8.4 (*)    Total Bilirubin 1.3 (*)    GFR, Estimated 51 (*)    All other components within normal limits  CBC WITH DIFFERENTIAL/PLATELET - Abnormal; Notable for the following components:   RBC 5.95 (*)    RDW 16.0 (*)    All other components within normal limits  ACETAMINOPHEN LEVEL - Abnormal; Notable for the following components:   Acetaminophen (Tylenol), Serum <10 (*)    All other components within normal limits  SALICYLATE LEVEL - Abnormal; Notable for the following components:   Salicylate Lvl <7.4 (*)    All other components within normal limits  CBG MONITORING, ED - Abnormal; Notable for the following components:   Glucose-Capillary 107 (*)    All other components within  normal limits  CULTURE, BLOOD (ROUTINE X 2)  CULTURE, BLOOD (ROUTINE X 2)  URINE CULTURE  AMMONIA  ETHANOL  URINALYSIS, COMPLETE (UACMP) WITH MICROSCOPIC  RAPID URINE DRUG SCREEN, HOSP PERFORMED  CBG MONITORING, ED    EKG EKG Interpretation  Date/Time:  Sunday December 04 2020 12:51:03 EST Ventricular Rate:  86 PR Interval:    QRS Duration: 92 QT Interval:  399 QTC Calculation: 478 R Axis:   13 Text Interpretation: Sinus rhythm Prolonged PR interval Probable left atrial enlargement LVH with secondary repolarization abnormality Inferior infarct, old Probable anterior infarct, age indeterminate When compared to prior, similar apperance. No STEMI Confirmed by Antony Blackbird [phone]) on 12/04/2020 12:56:21 PM   Radiology CT HEAD WO CONTRAST  Result Date: 12/04/2020 CLINICAL DATA:  Delirium, altered mental status EXAM: CT HEAD WITHOUT CONTRAST TECHNIQUE: Contiguous axial images were obtained from the base of the skull through the vertex without intravenous contrast. COMPARISON:  09/19/2020. FINDINGS: Brain: No acute intracranial hemorrhage. No focal mass lesion. No CT evidence of acute infarction. No midline shift or mass effect. No hydrocephalus. Basilar cisterns are patent. There are periventricular and subcortical white matter hypodensities. Generalized cortical atrophy. Vascular: No hyperdense vessel or unexpected calcification. Skull: Normal. Negative for fracture or focal lesion. Sinuses/Orbits: Chronic opacification of the RIGHT maxillary sinus Other: None. : 1. No acute intracranial findings. 2. Chronic atrophy and white matter microvascular disease. 3. Chronic opacification of the RIGHT maxillary sinus. Electronically Signed   By:  Suzy Bouchard M.D.   On: 12/04/2020 14:55   DG Chest Port 1 View  Result Date: 12/04/2020 CLINICAL DATA:  Altered mental status. EXAM: PORTABLE CHEST 1 VIEW COMPARISON:  July 17, 2020 FINDINGS: Cardiomediastinal silhouette is stable with a tortuous thoracic  aorta. No pneumothorax. No nodules or masses. No focal infiltrates. IMPRESSION: No active disease. Electronically Signed   By: Dorise Bullion III M.D   On: 12/04/2020 13:36    Procedures Procedures   Medications Ordered in ED Medications  metoprolol tartrate (LOPRESSOR) tablet 25 mg (has no administration in time range)  LORazepam (ATIVAN) injection 1 mg (1 mg Intravenous Given 12/04/20 1355)    ED Course  I have reviewed the triage vital signs and the nursing notes.  Pertinent labs & imaging results that were available during my care of the patient were reviewed by me and considered in my medical decision making (see chart for details).  Clinical Course as of 12/04/20 1519  Nancy Fetter Dec 04, 2020  1311 Chart review shows that patient's last wound care appointment was 12/02/2020 and they noted that he has had gradual improvement in this left lower extremity wound. [HK]  1317 BP(!): 215/105 [HK]  1317 Medication bottles at bedside and will have pharmacy complete med rec.  I am unsure if he has been taking these medications. [HK]  1320 Temp: 98 F (36.7 C) This is rectal temp. [HK]  1320 DG Chest Port 1 View No acute findings. [HK]  1354 WBC: 8.0 [HK]  1415 Ammonia: 23 [HK]  1416 Creatinine(!): 1.55 Around baseline. [HK]  1418 Alcohol, Ethyl (B): <10 [HK]  1427 Given ativan for agitation. [HK]  1501 CT HEAD WO CONTRAST No acute findings. [HK]    Clinical Course User Index [HK] Delia Heady, PA-C   MDM Rules/Calculators/A&P                          60 year old male with a past medical history of CAD, hypertension, hyperlipidemia, kidney disease status post kidney transplant in 2011, arthritis with chronic lower back pain presenting to the ED with altered mental status.  Unsure how long patient has been altered.  EMS was called by neighbor due to patient "not acting like himself."  He remembers did not have any additional information or any information about his chronic medical issues  even.  On arrival patient moving all extremities that difficulty.  Abdomen is soft and does not appear to be tender.  He is intermittently agitated and trying to get out of bed but otherwise is resting.  Cannot assess for strength or sensation due to being uncooperative in exam.  No signs of trauma noted.  Patient hypertensive here to 250 systolic I am unsure if he has taking his antihypertension medications although we have a bag of his medications here.  Will order lab work, imaging studies to evaluate for cause of his altered mental status. Will order MRI of the head as his CT is without any abnormal findings. Will order home dose of Lopressor to help with blood pressure control. Care handed off to oncoming provider pending remainder of workup and reassessment.   Portions of this note were generated with Lobbyist. Dictation errors may occur despite best attempts at proofreading.  Final Clinical Impression(s) / ED Diagnoses Final diagnoses:  Disorientation  Hypertensive urgency    Rx / DC Orders ED Discharge Orders    None       Khatri, Hina, PA-C  12/04/20 1519    Tegeler, Gwenyth Allegra, MD 12/04/20 [phone]

## 2020-12-04 NOTE — ED Triage Notes (Signed)
Pt arrived from home by EMS. neighbor called concerned about Diabetic crisis and AMS. Pt family initially denies changed with pt.  The [employer], states that pt is A&O4 at baseline and able to carry a conversation.  Pt not following commands and does not have clear speech.  Fire Department reported seeing pt attempting to take several of his prescriptions medications unsure if he ingested any or how many he ingested prior to their arrival.   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 Was most recently seen 12/02/20 with improvement

## 2020-12-04 NOTE — ED Notes (Signed)
Patient not agreeing to go to xray. Xray contacted and will be down shortly

## 2020-12-04 NOTE — [facility] Counselor (Signed)
Attempted TTS assessment. Pt confirmed name and date of birth but otherwise appeared somnolent and would not respond to any questions. Pt did not respond when asked if TTS had permission to speak with his family.  TTS will complete assessment when Pt is alert and responding to questions.   Evelena Peat, [facility], [facility] Triage Specialist [phone]

## 2020-12-04 NOTE — ED Notes (Signed)
Pt making multiple attempts to get out of bed and pull at lines and IVs  Provider aware, ordered ativan.   Condom cath placed on pt to obtain UA

## 2020-12-04 NOTE — ED Notes (Signed)
Patient sleeping upon entering room, respirations even and unlabored. Patient arousable to light touch. Patient opens eyes and groans at staff, but the quickly closes eyes again. RN offered patient PO fluids for meds, patient would not take same. Room safe.

## 2020-12-05 DIAGNOSIS — R410 Disorientation, unspecified: Secondary | ICD-10-CM | POA: Diagnosis not present

## 2020-12-05 LAB — URINE CULTURE: Culture: <10000 — AB

## 2020-12-05 MED ORDER — LORAZEPAM 1 MG PO TABS: 1.0000 mg | ORAL_TABLET | Freq: Four times a day (QID) | ORAL | Status: DC | PRN

## 2020-12-05 MED ORDER — LORAZEPAM 2 MG/ML IJ SOLN: 1.0000 mg | Freq: Four times a day (QID) | INTRAMUSCULAR | Status: DC | PRN

## 2020-12-05 MED ORDER — ARTIFICIAL TEARS OPHTHALMIC OINT
1.0000 "application " | TOPICAL_OINTMENT | OPHTHALMIC | Status: DC | PRN
  Administered 2020-12-05 – 2020-12-06 (×2): 1 via OPHTHALMIC
  Filled 2020-12-05: qty 3.5

## 2020-12-05 MED ORDER — ALBUTEROL SULFATE HFA 108 (90 BASE) MCG/ACT IN AERS
2.0000 | INHALATION_SPRAY | Freq: Four times a day (QID) | RESPIRATORY_TRACT | Status: DC | PRN
  Administered 2020-12-05: 2 via RESPIRATORY_TRACT
  Filled 2020-12-05: qty 6.7

## 2020-12-05 MED ORDER — HYDROXYZINE HCL 25 MG PO TABS: 25.0000 mg | ORAL_TABLET | Freq: Three times a day (TID) | ORAL | Status: DC | PRN

## 2020-12-05 NOTE — ED Notes (Signed)
Lunch Tray Ordered @ 1023. 

## 2020-12-05 NOTE — ED Notes (Signed)
Patient called out, RN to room. Patient sitting on side of bed, asked if he was in prison, speech clear. Patient reoriented to place and situation, he verbalized understanding of same. Patient c/o 10/10 pain to Rt eye. Advised patient he does have medications ordered for same that can be given. RN reinforced need to take scheduled medications in the morning, as he did not take his night time meds. Patient verbalized understanding of same. Lights dimmed. Room safe. PO fluids left at bedside. Patient denies further needs.

## 2020-12-05 NOTE — ED Notes (Signed)
Dinner Tray Ordered @ 1700. 

## 2020-12-05 NOTE — ED Notes (Signed)
1st cousin Theodore Arellano [phone] would like an update

## 2020-12-05 NOTE — ED Notes (Signed)
Pt resting comfortably in bed with eyes closed.  Pt ate 75% of breakfast tray.   Pt continues to be concerned that he did something wrong to be here, concerned about his house.  Continues to have Suicidal Ideation

## 2020-12-05 NOTE — BH Assessment (Signed)
Comprehensive Clinical Assessment (CCA) Note  12/05/2020 [name] [id number]  Per Theodore Sine, NP, patient is recommended for inpatient treatment.    Theodore Arellano is a 60 year old male presenting to [facility] voluntarily with chief complaint of altered mental status. Patient is oriented to person and place he is alert, engaged, and cooperative during assessment. Patient eye contact is normal however patient complains about his eyes hurting. Patient is confused, scattered, and has disorganized. His affect is somewhat anxious and his speech is tangential and slurred. Patient reports being in the ED today because "people or so-called friends want to take over my house." Patient further explains that "people picking at me in my own house". Patient reports that he has several housing issues; specifically, plumbing issues with no running water for the past year. Patient reports that his mother died about 2 years ago and since then he has not been compliant with his medications. Patient reports feeling paranoid and states "It feels like people trying to set me up. I don't want to go to jail for the rest of my life." Patient reports that he had some "friends" over his house using drugs and patient feels like the police is going to find drugs in his home and take him to jail. Patient makes several statements about people picking at him and people saying that he is an undercover cop. Patient also states "they put stuff on the tv trying to pick at me". Patient also feels that his daughter is going to get into trouble because of the things going on in his home.   Patient denies a prior psychiatric history and does not have any outpatient services. Patient acknowledges smoking crack cocaine "the other day" but does not know how much. Patient denies buying drugs but reports that he smokes crack when his friends come over. Patient also reports drinking one beer a day. Patient denies SI/HI/AVH and SIB and contracts for safety  but while in ED patient made suicidal statements to ED staff. Patient denies prior suicidal attempts.   Patient consents for TTS to obtain collateral from his daughter Theodore Arellano [phone]). Daughter reports that patient has been acting bizarrely since patient mom died about a year ago last 2023-01-28. Daughter reports that patient presents with a lot of confusion and has been making passive suicidal statements saying "I'm not going to be around no more". Daughter reports that she was in the ED when patient stated that he was making suicidal statements.  Daughter reports that before patient came to the ED he was laying in the front yard with an unloaded shotgun and was attempting to drink power steering fluid. Daughter denies patient having a mental health diagnosis but reports that patient presented this way a couple months ago and the hospital discharged him. Daughter reports that patient has been using drugs as long as she can remember and she acknowledges that patient does not have any running water.   The patient demonstrates the following risk factors for suicide: Chronic risk factors for suicide include: substance use disorder, medical illness , chronic pain and demographic factors (male, >24 y/o). Acute risk factors for suicide include: loss (financial, interpersonal, professional). Protective factors for this patient include: none. Considering these factors, the overall suicide risk at this point appears to be medium Patient is not appropriate for outpatient follow up.  [facility] from 12/04/2020 in [facility] to Hosp-Admission (Discharged) from 11/07/2019 in [facility]  CATEGORY High Risk Error: Q3, 4, or 5 should not be populated when Q2 is No      Chief Complaint:  Chief Complaint  Patient presents with  . Altered Mental Status   Visit Diagnosis: Substance-induced mood disorder    CCA Screening, Triage and  Referral (STR)  Patient Reported Information How did you hear about [place]? No data recorded Referral name: No data recorded Referral phone number: No data recorded  Whom do you see for routine medical problems? No data recorded Practice/Facility Name: No data recorded Practice/Facility Phone Number: No data recorded Name of Contact: No data recorded Contact Number: No data recorded Contact Fax Number: No data recorded Prescriber Name: No data recorded Prescriber Address (if known): No data recorded  What Is the Reason for Your Visit/Call Today? No data recorded How Long Has This Been Causing You Problems? No data recorded What Do You Feel Would Help You the Most Today? No data recorded  Have You Recently Been in Any Inpatient Treatment (Hospital/Detox/Crisis Center/28-Day Program)? No data recorded Name/Location of Program/Hospital:No data recorded How Long Were You There? No data recorded When Were You Discharged? No data recorded  Have You Ever Received Services From [facility] Before? No data recorded Who Do You See at [facility]? No data recorded  Have You Recently Had Any Thoughts About Hurting Yourself? No data recorded Are You Planning to Commit Suicide/Harm Yourself At This time? No data recorded  Have you Recently Had Thoughts About [place]? No data recorded Explanation: No data recorded  Have You Used Any Alcohol or Drugs in the Past 24 Hours? No data recorded How Long Ago Did You Use Drugs or Alcohol? No data recorded What Did You Use and How Much? No data recorded  Do You Currently Have a Therapist/Psychiatrist? No data recorded Name of Therapist/Psychiatrist: No data recorded  Have You Been Recently Discharged From Any Office Practice or Programs? No data recorded Explanation of Discharge From Practice/Program: No data recorded    CCA Screening Triage Referral Assessment Type of Contact: No data recorded Is this Initial or Reassessment? No data  recorded Date Telepsych consult ordered in [facility]:  No data recorded Time Telepsych consult ordered in [facility]:  No data recorded  Patient Reported Information Reviewed? No data recorded Patient Left Without Being Seen? No data recorded Reason for Not Completing Assessment: No data recorded  Collateral Involvement: No data recorded  Does Patient Have a [place]? No data recorded Name and Contact of Legal Guardian: No data recorded If Minor and Not Living with Parent(s), Who has Custody? No data recorded Is CPS involved or ever been involved? No data recorded Is APS involved or ever been involved? No data recorded  Patient Determined To Be At Risk for Harm To Self or Others Based on Review of Patient Reported Information or Presenting Complaint? No data recorded Method: No data recorded Availability of Means: No data recorded Intent: No data recorded Notification Required: No data recorded Additional Information for Danger to Others Potential: No data recorded Additional Comments for Danger to Others Potential: No data recorded Are There Guns or Other Weapons in Your Home? No data recorded Types of Guns/Weapons: No data recorded Are These Weapons Safely Secured?                            No data recorded Who Could Verify You Are Able To Have These Secured: No data recorded  Do You Have any Outstanding Charges, Pending Court Dates, Parole/Probation? No data recorded Contacted To Inform of Risk of Harm To Self or Others: No data recorded  Location of Assessment: No data recorded  Does Patient Present under Involuntary Commitment? No data recorded IVC Papers Initial File Date: No data recorded  [place] of Residence: No data recorded  Patient Currently Receiving the Following Services: No data recorded  Determination of Need: No data recorded  Options For Referral: No data recorded    CCA Biopsychosocial Intake/Chief Complaint:  AMS and SI  Current  Symptoms/Problems: Eye pain   Patient Reported Schizophrenia/Schizoaffective Diagnosis in Past: No   Strengths: UTA  Preferences: UTA  Abilities: UTA   Type of Services Patient Feels are Needed: No data recorded  Initial Clinical Notes/Concerns: No data recorded  Mental Health Symptoms Depression:  None   Duration of Depressive symptoms: No data recorded  Mania:  None   Anxiety:   Worrying; Tension   Psychosis:  None   Duration of Psychotic symptoms: Less than six months   Trauma:  None   Obsessions:  None   Compulsions:  None   Inattention:  None   Hyperactivity/Impulsivity:  N/A   Oppositional/Defiant Behaviors:  N/A   Emotional Irregularity:  N/A   Other Mood/Personality Symptoms:  No data recorded   Mental Status Exam Appearance and self-care  Stature:  Average   Weight:  Average weight   Clothing:  No data recorded  Grooming:  Neglected   Cosmetic use:  None   Posture/gait:  Slumped   Motor activity:  Not Remarkable   Sensorium  Attention:  Confused   Concentration:  Scattered   Orientation:  Person; Place   Recall/memory:  Defective in Short-term   Affect and Mood  Affect:  Anxious   Mood:  Anxious; Irritable   Relating  Eye contact:  Normal   Facial expression:  Tense   Attitude toward examiner:  Cooperative   Thought and Language  Speech flow: Slurred; Articulation error; Garbled   Thought content:  Appropriate to Mood and Circumstances; Delusions   Preoccupation:  None   Hallucinations:  None   Organization:  No data recorded  [employer] of Knowledge:  Impoverished by (Comment)   Intelligence:  Average   Abstraction:  No data recorded  Judgement:  Dangerous   Reality Testing:  No data recorded  Insight:  Lacking   Decision Making:  Confused   Social Functioning  Social Maturity:  Irresponsible   Social Judgement:  Victimized   Stress  Stressors:  Illness; Housing; Grief/losses    Coping Ability:  Exhausted; Deficient supports   Skill Deficits:  Responsibility; Decision making   Supports:  Support needed     Religion:    Leisure/Recreation:    Exercise/Diet:     CCA Employment/Education Employment/Work Situation: Employment / Work Scientist, research (life sciences) is the longest time patient has a held a job?: UTA Where was the patient employed at that time?: UTA  Education:     CCA Family/Childhood History Family and Relationship History: Family history What is your sexual orientation?: UTA Has your sexual activity been affected by drugs, alcohol, medication, or emotional stress?: UTA Does patient have children?: Yes How is patient's relationship with their children?: daughter who  is available for support  Childhood History:  Childhood History Additional childhood history information: UTA Description of patient's relationship with caregiver when they were a child: UTA Patient's description of current relationship with people who raised him/her: UTA  How were you disciplined when you got in trouble as a child/adolescent?: UTA  Child/Adolescent Assessment:     CCA Substance Use Alcohol/Drug Use: Alcohol / Drug Use Pain Medications: See MAR Prescriptions: See MAR Over the Counter: See MAR History of alcohol / drug use?: Yes Substance #1 Name of Substance 1: Crack Cocaine 1 - Frequency: "every three days" 1 - Duration: ongoing 1 - Last Use / Amount: "the other day" Substance #2 Name of Substance 2: ETOH 2 - Amount (size/oz): 1 beer 2 - Frequency: daily 2 - Duration: ongoing 2 - Last Use / Amount: unknown                     ASAM's:  Six Dimensions of Multidimensional Assessment  Dimension 1:  Acute Intoxication and/or Withdrawal Potential:      Dimension 2:  Biomedical Conditions and Complications:      Dimension 3:  Emotional, Behavioral, or Cognitive Conditions and Complications:     Dimension 4:  Readiness to Change:      Dimension 5:  Relapse, Continued use, or Continued Problem Potential:     Dimension 6:  Recovery/Living Environment:     ASAM Severity Score:    ASAM Recommended Level of Treatment:     Substance use Disorder (SUD)    Recommendations for Services/Supports/Treatments: Recommendations for Services/Supports/Treatments Recommendations For Services/Supports/Treatments: SAIOP (Substance Abuse Intensive Outpatient Program)  DSM5 Diagnoses: Patient Active Problem List   Diagnosis Date Noted  . Cellulitis of left lower extremity 01/11/2020  . Sepsis ([place]) 01/11/2020  . DM (diabetes mellitus) type II, controlled, with peripheral vascular disorder ([place]) 01/11/2020  . Chronic pain syndrome 01/11/2020  . Sepsis due to cellulitis ([place]) 01/11/2020  . ESRD (end stage renal disease) ([place]) 01/06/2020  . Acute kidney injury ([place]) 11/08/2019  . COVID-19 virus infection 11/08/2019  . History of DVT (deep vein thrombosis) 11/08/2019  . Hx of CABG 09/04/2019  . Disorder of arteries and arterioles, unspecified ([place]) 07/21/2019  . Wound of left leg 07/21/2019  . Left leg pain 06/28/2019  . Abnormal ECG 06/05/2019  . Mobitz type I Wenckebach atrioventricular block 06/05/2019  . Unstable angina pectoris due to coronary arteriosclerosis ([place]) 05/30/2019  . Left ventricular dysfunction 01/08/2018  . Coronary artery disease involving native coronary artery of native heart without angina pectoris   . Coronary artery disease due to lipid rich plaque   . Elevated troponin   . Tobacco use 09/02/2017  . Chest pain 08/27/2016  . Renal transplant recipient 08/27/2016  . Essential hypertension 08/27/2016  . HLD (hyperlipidemia) 08/27/2016  . Type 2 diabetes mellitus with vascular disease ([place]) 08/27/2016  . Abnormal nuclear stress test 11/02/2015  . CKD (chronic kidney disease) 11/02/2015  . Current smoker 11/02/2015  . Dyslipidemia 11/02/2015  . Immunosuppression ([place]) 09/02/2014  . Rash 09/02/2014  .  Left groin pain 06/24/2013  . Anemia 05/21/2012  . Leucopenia 05/21/2012  . Personal history of immunosupression therapy 05/21/2012    Per Theodore Sine, NP, patient is recommended for inpatient treatment.    [place], [facility]

## 2020-12-05 NOTE — ED Notes (Signed)
Patient was given Catheryn Bacon, and Cheese, with Applesauce.

## 2020-12-05 NOTE — ED Provider Notes (Signed)
Emergency Medicine Observation Re-evaluation Note  Theodore Arellano is a 60 y.o. male, seen on rounds today.  Pt initially presented to the ED for complaints of Altered Mental Status Currently, the patient is being reassessed by TTS  Physical Exam  BP (!) 152/104 (BP Location: Right Arm)   Pulse 83   Temp 97.9 F (36.6 C) (Oral)   Resp 18   SpO2 99%  Physical Exam General: wdwn Cardiac: rrr Lungs: clear Psych: awake  alert  ED Course / MDM  EKG:EKG Interpretation  Date/Time:  Sunday December 04 2020 12:51:03 EST Ventricular Rate:  86 PR Interval:    QRS Duration: 92 QT Interval:  399 QTC Calculation: 478 R Axis:   13 Text Interpretation: Sinus rhythm Prolonged PR interval Probable left atrial enlargement LVH with secondary repolarization abnormality Inferior infarct, old Probable anterior infarct, age indeterminate When compared to prior, similar apperance. No STEMI Confirmed by Antony Blackbird [phone]) on 12/04/2020 12:56:21 PM  Clinical Course as of 12/05/20 0959  Sun Dec 04, 2020  1311 Chart review shows that patient's last wound care appointment was 12/02/2020 and they noted that he has had gradual improvement in this left lower extremity wound. [HK]  1317 BP(!): 215/105 [HK]  1317 Medication bottles at bedside and will have pharmacy complete med rec.  I am unsure if he has been taking these medications. [HK]  1320 Temp: 98 F (36.7 C) This is rectal temp. [HK]  1320 DG Chest Port 1 View No acute findings. [HK]  1354 WBC: 8.0 [HK]  1415 Ammonia: 23 [HK]  1416 Creatinine(!): 1.55 Around baseline. [HK]  1418 Alcohol, Ethyl (B): <10 [HK]  1427 Given ativan for agitation. [HK]  1501 CT HEAD WO CONTRAST No acute findings. [HK]    Clinical Course User Index [HK] Khatri, Hina, PA-C   I have reviewed the labs performed to date as well as medications administered while in observation.  Recent changes in the last 24 hours include none  Plan  Current plan is for  TTS camera in  room.  Pt being reassessed,  I will follow up for current plan  Patient is not under full IVC at this time.   Fransico Meadow, Hershal Coria 12/05/20 1002    Dorie Rank, MD 12/07/20 [phone]

## 2020-12-05 NOTE — ED Notes (Signed)
Pt resting comfortably in bed with eyes closed Respirations are even and non-labored

## 2020-12-05 NOTE — Progress Notes (Signed)
Pt. meets criteria for inpatient treatment per Harriett Sine, NP. Referred out to the following hospitals:   [facility] Provider Address Phone Fax  [facility]  [address] [place] [address] [phone]  [facility]  [address], [place] [address] [phone] [facility]  [address] [place] [address] [phone] [facility]  [place], [place] [address] [phone]  [facility]  [address] [place], [place] [address] [phone]  [facility]  [address], [place] [address] [phone] [facility]  [address], [place] [address] [phone] [facility]  [address] [place] [address] [phone]  [facility] Office  [address] [place] [address] [phone]     Disposition CSW will continue to follow for placement.  Signed:  Durenda Hurt, MSW, [place], LCASA 12/05/2020 2:49 PM

## 2020-12-05 NOTE — ED Notes (Signed)
Patient having TTS at this time.

## 2020-12-05 NOTE — ED Notes (Signed)
Pt daughter updated on plan of care Pt given pain medications and warm blankets  Calm and cooperative at this time

## 2020-12-06 DIAGNOSIS — R410 Disorientation, unspecified: Secondary | ICD-10-CM | POA: Diagnosis not present

## 2020-12-06 DIAGNOSIS — F19959 Other psychoactive substance use, unspecified with psychoactive substance-induced psychotic disorder, unspecified: Secondary | ICD-10-CM

## 2020-12-06 MED ORDER — HYDROCERIN EX CREA
TOPICAL_CREAM | Freq: Every day | CUTANEOUS | Status: DC
  Filled 2020-12-06: qty 113

## 2020-12-06 NOTE — ED Provider Notes (Signed)
Emergency Medicine Observation Re-evaluation Note  Theodore Arellano is a 60 y.o. male, seen on rounds today.  Pt initially presented to the ED for complaints of Altered Mental Status Currently, the patient is alert, content, no new c/o, states feels ready to go home.   Physical Exam  BP (!) 154/71 (BP Location: Left Arm)   Pulse 72   Temp 98 F (36.7 C) (Oral)   Resp 17   SpO2 100%  Physical Exam General: alert, content Cardiac: regular rate Lungs: breathing comfortably Psych: normal mood and affect. Patient denies any SI or any thoughts of self harm.   ED Course / MDM  EKG:EKG Interpretation  Date/Time:  Sunday December 04 2020 12:51:03 EST Ventricular Rate:  86 PR Interval:    QRS Duration: 92 QT Interval:  399 QTC Calculation: 478 R Axis:   13 Text Interpretation: Sinus rhythm Prolonged PR interval Probable left atrial enlargement LVH with secondary repolarization abnormality Inferior infarct, old Probable anterior infarct, age indeterminate When compared to prior, similar apperance. No STEMI Confirmed by Antony Blackbird [phone]) on 12/04/2020 12:56:21 PM  I have reviewed the labs performed to date as well as medications administered while in observation.  Recent changes in the last 24 hours include stabilization/improvement on meds, BH reassessment.   Plan  [facility] team has reassessed and indicates patient is psych clear for discharge.   Pt ambulatory back and forth to bathroom, steady gait. Pt is eating and drinking. Pt w normal mood and affect. No thoughts of harm to self or others. No SI. Pt is alert, oriented, and appears to be thinking clearly and rationally. Indicates he has adequate of his meds at home. Pt indicates has weekly f/u with wound care. Wound care team here has reassessed and rec good hygiene to area, and eucerin/moisturizer, and outpt f/u.   No pain, tenderness,  or spreading redness to area.  +dry scaly skin.  Pt is afebrile. bp is improves/stable. Pt denies any new  physical c/o, no fever/chills, no new pain or other complaint.   Pt does acknowledge financial stressors, indicating he spent monthly check 'on things I shouldn't have'.  Does indicate has support/family support in area, has place to stay, and indicates has adequate meds.   Will make peer support referral and encourage pt to pursue sobriety/outpt rehab (currently pt not interested in inpatient rehab/drug tx when offered).  Also discussed w TOC team who will visit w pt and share outpatient resources.  Pt currently appears stable for d/c.   Return precautions provided.       Lajean Saver, MD 12/06/20 1355

## 2020-12-06 NOTE — Consult Note (Signed)
WOC Nurse Consult Note: Patient receiving care in [facility] [phone]. Reason for Consult: wound care to LLE Wound type: nearly healed surgical area Pressure Injury POA: Yes/No/NA Measurement: 1.3 cm x 0.6 cm x 0.1 cm Wound bed: 100% pink Drainage (amount, consistency, odor) none Periwound: heavily flaking, crusting, peeling Dressing procedure/placement/frequency:  Wash LLE with soap and water. Use the washcloth to remove as much of the dry, flaking skin as possible. Dry the leg. Apply Eucerin cream to leg. Place a small piece of Xeroform gauze Kellie Simmering [phone]) over the small open area on the lateral side of the leg. Secure with a few turns of kerlex. Perform daily. Thank you for the consult.  Discussed plan of care with the patient and bedside nurse.  [place] nurse will not follow at this time.  Please re-consult the [place] team if needed.  Val Riles, RN, MSN, CWOCN, CNS-BC, pager [phone]

## 2020-12-06 NOTE — Discharge Instructions (Addendum)
It was our pleasure to provide your ER care today - we hope that you feel better.  For skin/legs, keep area very clean and dry - wash with warm water and mild soap 2x/day, then dry well, and apply thin coat of eucerin or aveeno moisturizer cream to area.  Follow up with your wound care specialist in the coming week.   For your health and well-being, avoid any smoking or cocaine use. See resource guide provided to help access outpatient treatment programs.   Also follow up with behavioral health provider in the coming week.   For mental health issues and/or crisis, you may go directly to the [facility] - it is open 24/7 and walk-ins are welcome.   Return to ER right away if worse, new symptoms, fevers, severe leg pain, spreading redness, chest pain, trouble breathing, or other concern.   Take your blood pressure meds as prescribed, and limit salt intake.  Follow up with primary care doctor for recheck in one week.

## 2020-12-06 NOTE — ED Notes (Signed)
Lunch Tray Ordered @ 1015. 

## 2020-12-06 NOTE — ED Provider Notes (Signed)
   Received notification from Harriett Sine, psych NP, patient is now psych cleared. They determined the patient had "substance-induced mood/psychotic symptoms."   Patient is alert and oriented x4.  He has no current complaints.  Denies SI/HI.  Denies A/V hallucinations. Social work added outpatient resources to the patient's AVS.  Patient is not under IVC at this time.    Lorayne Bender, PA-C 12/06/20 1401    [name], MD 12/06/20 [phone]

## 2020-12-06 NOTE — ED Provider Notes (Signed)
Emergency Medicine Observation Re-evaluation Note  Theodore Arellano is a 60 y.o. male, seen on rounds today.  Pt initially presented to the ED for complaints of Altered Mental Status Currently, the patient is calm and cooperative.  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 No STEMI Confirmed by Theodore Arellano [phone]) on 12/04/2020 12:56:21 PM  Clinical Course as of 12/06/20 0940  Sun Dec 04, 2020  1311 Chart review shows that patient's last wound care appointment was 12/02/2020 and they noted that he has had gradual improvement in this left lower extremity wound. [HK]  1317 BP(!): 215/105 [HK]  1317 Medication bottles at bedside and will have pharmacy complete med rec.  I am unsure if he has been taking these medications. [HK]  1320 Temp: 98 F (36.7 C) This is rectal temp. [HK]  1320 DG Chest Port 1 View No acute findings. [HK]  1354 WBC: 8.0 [HK]  1415 Ammonia: 23 [HK]  1416 Creatinine(!): 1.55 Around baseline. [HK]  1418 Alcohol, Ethyl (B): <10 [HK]  1427 Given ativan for agitation. [HK]  1501 CT HEAD WO CONTRAST No acute findings. [HK]    Clinical Course User Index [HK] Khatri, Hina, PA-C   I have reviewed the labs performed to date as well as medications administered while in observation.  Recent changes in the last 24 hours include  awaiting placement.  Plan  Current plan is for inpatient psychiatric treatment per Harriett Sine, psych NP, referenced in [facility]'s note on 12/05/2020. Patient is not under full IVC at this time.  All home medications have been ordered.   As of this morning, still searching for placement in [facility] psych facility.  Vitals:   12/05/20 1304 12/05/20 2207 12/06/20 0607 12/06/20 0815  BP: (!) 194/98 (!) 173/90 (!) 144/73 (!) 154/71  Pulse: 66 60 70 72  Resp: 16 20 16 17   Temp: 98.5 F (36.9 C) 98.3 F (36.8 C) 98 F (36.7 C)   TempSrc: Oral Oral Oral   SpO2: 96% 100% 100% 100%      [name] 12/06/20 1159    [name], MD 12/06/20 1702

## 2020-12-06 NOTE — ED Notes (Signed)
Pt d/c home per MD order. Discharge summary reviewed, pt verbalizes understanding. Pt daughter here to ED for discharge ride home. All personal property returned. No s/s of acute distress noted at discharge.

## 2020-12-06 NOTE — Progress Notes (Addendum)
Disposition CSW referred patient to geriatric inpatient [place] facilities again at this time. See note by writer on 3/07 @ 1448 for list. Will continue to monitor progress.    Signed:  Durenda Hurt, MSW, [place], [place] 12/06/2020 10:57 AM   ADDENDUM  Patient has been psychiatrically cleared per Harriett Sine, NP. CSW will no longer pursue inpatient geriatric [facility] referral. Outpatient resources have been added to AVS.   Signed:  Durenda Hurt, MSW, LCSWA, LCASA 12/06/2020 12:12 PM

## 2020-12-06 NOTE — ED Notes (Signed)
Tele psych machine at bedside for assessment

## 2020-12-06 NOTE — ED Notes (Signed)
Pt up at nurses station using phone to talk to daughter, sitter present. Will continue to monitor.

## 2020-12-06 NOTE — Progress Notes (Signed)
CSW contacted patients daughter [name] to let her know her father was ready to be picked up. Desiree stated she would be up there to get him.
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
## 2020-12-06 NOTE — Progress Notes (Signed)
CSW spoke with patient about substance abuse resources. Disposition social worker already attached outpatient resources to his AVS.

## 2020-12-06 NOTE — ED Notes (Signed)
Wound care at bedside.

## 2020-12-06 NOTE — ED Notes (Signed)
Wound care provided to LLE per MD order. Pt tolerated well.

## 2020-12-08 ENCOUNTER — Emergency Department (HOSPITAL_COMMUNITY)
Admission: EM | Admit: 2020-12-08 | Discharge: 2020-12-08 | Disposition: A | Discharge status: Home / Self Care | Payer: Medicare Other | Attending: Emergency Medicine | Admitting: Emergency Medicine

## 2020-12-08 ENCOUNTER — Emergency Department (HOSPITAL_COMMUNITY): Payer: Medicare Other

## 2020-12-08 DIAGNOSIS — Z79899 Other long term (current) drug therapy: Secondary | ICD-10-CM | POA: Insufficient documentation

## 2020-12-08 DIAGNOSIS — E1122 Type 2 diabetes mellitus with diabetic chronic kidney disease: Secondary | ICD-10-CM | POA: Insufficient documentation

## 2020-12-08 DIAGNOSIS — Z7901 Long term (current) use of anticoagulants: Secondary | ICD-10-CM | POA: Insufficient documentation

## 2020-12-08 DIAGNOSIS — R001 Bradycardia, unspecified: Secondary | ICD-10-CM | POA: Insufficient documentation

## 2020-12-08 DIAGNOSIS — E1151 Type 2 diabetes mellitus with diabetic peripheral angiopathy without gangrene: Secondary | ICD-10-CM | POA: Insufficient documentation

## 2020-12-08 DIAGNOSIS — Z940 Kidney transplant status: Secondary | ICD-10-CM | POA: Diagnosis not present

## 2020-12-08 DIAGNOSIS — N186 End stage renal disease: Secondary | ICD-10-CM | POA: Diagnosis not present

## 2020-12-08 DIAGNOSIS — Z8616 Personal history of COVID-19: Secondary | ICD-10-CM | POA: Insufficient documentation

## 2020-12-08 DIAGNOSIS — S80811A Abrasion, right lower leg, initial encounter: Secondary | ICD-10-CM | POA: Insufficient documentation

## 2020-12-08 DIAGNOSIS — R9431 Abnormal electrocardiogram [ECG] [EKG]: Secondary | ICD-10-CM | POA: Diagnosis not present

## 2020-12-08 DIAGNOSIS — F17210 Nicotine dependence, cigarettes, uncomplicated: Secondary | ICD-10-CM | POA: Insufficient documentation

## 2020-12-08 DIAGNOSIS — S8991XA Unspecified injury of right lower leg, initial encounter: Secondary | ICD-10-CM | POA: Diagnosis present

## 2020-12-08 DIAGNOSIS — R4182 Altered mental status, unspecified: Secondary | ICD-10-CM | POA: Diagnosis not present

## 2020-12-08 DIAGNOSIS — I120 Hypertensive chronic kidney disease with stage 5 chronic kidney disease or end stage renal disease: Secondary | ICD-10-CM | POA: Insufficient documentation

## 2020-12-08 DIAGNOSIS — Z951 Presence of aortocoronary bypass graft: Secondary | ICD-10-CM | POA: Diagnosis not present

## 2020-12-08 DIAGNOSIS — I2510 Atherosclerotic heart disease of native coronary artery without angina pectoris: Secondary | ICD-10-CM | POA: Insufficient documentation

## 2020-12-08 DIAGNOSIS — Z7982 Long term (current) use of aspirin: Secondary | ICD-10-CM | POA: Diagnosis not present

## 2020-12-08 DIAGNOSIS — Y92410 Unspecified street and highway as the place of occurrence of the external cause: Secondary | ICD-10-CM | POA: Insufficient documentation

## 2020-12-08 DIAGNOSIS — Z20822 Contact with and (suspected) exposure to covid-19: Secondary | ICD-10-CM | POA: Insufficient documentation

## 2020-12-08 LAB — I-STAT CHEM 8, ED
BUN: 22 mg/dL — ABNORMAL HIGH (ref 6–20)
Calcium, Ion: 1.19 mmol/L (ref 1.15–1.40)
Chloride: 107 mmol/L (ref 98–111)
Creatinine, Ser: 1.6 mg/dL — ABNORMAL HIGH (ref 0.61–1.24)
Glucose, Bld: 115 mg/dL — ABNORMAL HIGH (ref 70–99)
HCT: 46 % (ref 39.0–52.0)
Hemoglobin: 15.6 g/dL (ref 13.0–17.0)
Potassium: 5 mmol/L (ref 3.5–5.1)
Sodium: 139 mmol/L (ref 135–145)
TCO2: 26 mmol/L (ref 22–32)

## 2020-12-08 LAB — CBC WITH DIFFERENTIAL/PLATELET
Abs Immature Granulocytes: 0.02 10*3/uL (ref 0.00–0.07)
Basophils Absolute: 0 10*3/uL (ref 0.0–0.1)
Basophils Relative: 0 %
Eosinophils Absolute: 0.1 10*3/uL (ref 0.0–0.5)
Eosinophils Relative: 2 %
HCT: 46.3 % (ref 39.0–52.0)
Hemoglobin: 14.5 g/dL (ref 13.0–17.0)
Immature Granulocytes: 0 %
Lymphocytes Relative: 14 %
Lymphs Abs: 0.8 10*3/uL (ref 0.7–4.0)
MCH: 28.1 pg (ref 26.0–34.0)
MCHC: 31.3 g/dL (ref 30.0–36.0)
MCV: 89.7 fL (ref 80.0–100.0)
Monocytes Absolute: 0.5 10*3/uL (ref 0.1–1.0)
Monocytes Relative: 8 %
Neutro Abs: 4.1 10*3/uL (ref 1.7–7.7)
Neutrophils Relative %: 76 %
Platelets: 214 10*3/uL (ref 150–400)
RBC: 5.16 MIL/uL (ref 4.22–5.81)
RDW: 15.5 % (ref 11.5–15.5)
WBC: 5.4 10*3/uL (ref 4.0–10.5)
nRBC: 0 % (ref 0.0–0.2)

## 2020-12-08 LAB — COMPREHENSIVE METABOLIC PANEL
ALT: 12 U/L (ref 0–44)
AST: 19 U/L (ref 15–41)
Albumin: 3 g/dL — ABNORMAL LOW (ref 3.5–5.0)
Alkaline Phosphatase: 75 U/L (ref 38–126)
Anion gap: 6 (ref 5–15)
BUN: 20 mg/dL (ref 6–20)
CO2: 26 mmol/L (ref 22–32)
Calcium: 9.1 mg/dL (ref 8.9–10.3)
Chloride: 104 mmol/L (ref 98–111)
Creatinine, Ser: 1.64 mg/dL — ABNORMAL HIGH (ref 0.61–1.24)
GFR, Estimated: 48 mL/min — ABNORMAL LOW (ref >60)
Glucose, Bld: 120 mg/dL — ABNORMAL HIGH (ref 70–99)
Potassium: 5.1 mmol/L (ref 3.5–5.1)
Sodium: 136 mmol/L (ref 135–145)
Total Bilirubin: 0.5 mg/dL (ref 0.3–1.2)
Total Protein: 7 g/dL (ref 6.5–8.1)

## 2020-12-08 LAB — MAGNESIUM: Magnesium: 1.9 mg/dL (ref 1.7–2.4)

## 2020-12-08 LAB — ACETAMINOPHEN LEVEL: Acetaminophen (Tylenol), Serum: <10 ug/mL — ABNORMAL LOW (ref 10–30)

## 2020-12-08 LAB — ETHANOL: Alcohol, Ethyl (B): <10 mg/dL (ref <10)

## 2020-12-08 LAB — POC SARS CORONAVIRUS 2 AG -  ED: SARS Coronavirus 2 Ag: NEGATIVE

## 2020-12-08 LAB — SALICYLATE LEVEL: Salicylate Lvl: <7 mg/dL — ABNORMAL LOW (ref 7.0–30.0)

## 2020-12-08 LAB — LIPASE, BLOOD: Lipase: 31 U/L (ref 11–51)

## 2020-12-08 LAB — CBG MONITORING, ED: Glucose-Capillary: 102 mg/dL — ABNORMAL HIGH (ref 70–99)

## 2020-12-08 NOTE — Discharge Instructions (Addendum)
As discussed, although you have elected to follow-up as an outpatient and not stay for admission, it is very important that you monitor your condition carefully and do not hesitate to return here for any concerning changes.  Otherwise, please be sure to follow-up with your primary care physician and your cardiologist as soon as possible.  In the interim, please do not take your metoprolol/Lopressor.
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
## 2020-12-08 NOTE — ED Notes (Signed)
Pt stated he had to go and left without dc paperwork and vital signs

## 2020-12-08 NOTE — ED Provider Notes (Signed)
4:42 PM Care of the patient assumed at signout.  Now, on exam patient is awake, alert, in no distress. Vital signs unremarkable. There is noted to have had episodes of bradycardia here, he has none currently. We discussed indication for admission, and the recommendation for this. Patient notes that he does not want to stay, is interested in going home, as he has obligations. I reiterated the importance of staying, but patient notes that he has to leave, would like to follow-up with his primary care physician and his cardiologist. He is amenable to stopping his beta-blocker in the interim.  Patient discharged, per request to follow-up as an outpatient.   Carmin Muskrat, MD 12/08/20 [phone]

## 2020-12-08 NOTE — ED Notes (Signed)
Patient transported to CT 
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
## 2020-12-09 LAB — CULTURE, BLOOD (ROUTINE X 2): Culture: NO GROWTH

## 2020-12-27 ENCOUNTER — Inpatient Hospital Stay (HOSPITAL_COMMUNITY)
Admission: EM | Admit: 2020-12-27 | Discharge: 2021-01-07 | DRG: 253 | Disposition: A | Discharge status: Skilled Nursing Facility | Payer: Medicare Other | Attending: Family Medicine | Admitting: Family Medicine

## 2020-12-27 ENCOUNTER — Emergency Department (HOSPITAL_COMMUNITY): Payer: Medicare Other

## 2020-12-27 ENCOUNTER — Encounter (HOSPITAL_COMMUNITY): Payer: Self-pay | Admitting: Emergency Medicine

## 2020-12-27 ENCOUNTER — Other Ambulatory Visit: Payer: Self-pay

## 2020-12-27 ENCOUNTER — Inpatient Hospital Stay (HOSPITAL_COMMUNITY): Payer: Medicare Other

## 2020-12-27 ENCOUNTER — Emergency Department (HOSPITAL_BASED_OUTPATIENT_CLINIC_OR_DEPARTMENT_OTHER): Payer: Medicare Other

## 2020-12-27 DIAGNOSIS — I771 Stricture of artery: Secondary | ICD-10-CM | POA: Diagnosis present

## 2020-12-27 DIAGNOSIS — I2510 Atherosclerotic heart disease of native coronary artery without angina pectoris: Secondary | ICD-10-CM | POA: Diagnosis present

## 2020-12-27 DIAGNOSIS — K219 Gastro-esophageal reflux disease without esophagitis: Secondary | ICD-10-CM | POA: Diagnosis present

## 2020-12-27 DIAGNOSIS — E1151 Type 2 diabetes mellitus with diabetic peripheral angiopathy without gangrene: Principal | ICD-10-CM | POA: Diagnosis present

## 2020-12-27 DIAGNOSIS — Z951 Presence of aortocoronary bypass graft: Secondary | ICD-10-CM

## 2020-12-27 DIAGNOSIS — Z01810 Encounter for preprocedural cardiovascular examination: Secondary | ICD-10-CM | POA: Diagnosis not present

## 2020-12-27 DIAGNOSIS — Z833 Family history of diabetes mellitus: Secondary | ICD-10-CM

## 2020-12-27 DIAGNOSIS — Z7901 Long term (current) use of anticoagulants: Secondary | ICD-10-CM

## 2020-12-27 DIAGNOSIS — F209 Schizophrenia, unspecified: Secondary | ICD-10-CM | POA: Diagnosis present

## 2020-12-27 DIAGNOSIS — M79604 Pain in right leg: Secondary | ICD-10-CM

## 2020-12-27 DIAGNOSIS — M1A9XX0 Chronic gout, unspecified, without tophus (tophi): Secondary | ICD-10-CM | POA: Diagnosis present

## 2020-12-27 DIAGNOSIS — R001 Bradycardia, unspecified: Secondary | ICD-10-CM | POA: Diagnosis not present

## 2020-12-27 DIAGNOSIS — L97909 Non-pressure chronic ulcer of unspecified part of unspecified lower leg with unspecified severity: Secondary | ICD-10-CM | POA: Diagnosis not present

## 2020-12-27 DIAGNOSIS — L97819 Non-pressure chronic ulcer of other part of right lower leg with unspecified severity: Secondary | ICD-10-CM | POA: Diagnosis present

## 2020-12-27 DIAGNOSIS — Z9114 Patient's other noncompliance with medication regimen: Secondary | ICD-10-CM

## 2020-12-27 DIAGNOSIS — I2583 Coronary atherosclerosis due to lipid rich plaque: Secondary | ICD-10-CM | POA: Diagnosis present

## 2020-12-27 DIAGNOSIS — Z20822 Contact with and (suspected) exposure to covid-19: Secondary | ICD-10-CM | POA: Diagnosis present

## 2020-12-27 DIAGNOSIS — F17210 Nicotine dependence, cigarettes, uncomplicated: Secondary | ICD-10-CM | POA: Diagnosis present

## 2020-12-27 DIAGNOSIS — H409 Unspecified glaucoma: Secondary | ICD-10-CM | POA: Diagnosis present

## 2020-12-27 DIAGNOSIS — Z86718 Personal history of other venous thrombosis and embolism: Secondary | ICD-10-CM

## 2020-12-27 DIAGNOSIS — F1490 Cocaine use, unspecified, uncomplicated: Secondary | ICD-10-CM | POA: Diagnosis present

## 2020-12-27 DIAGNOSIS — I745 Embolism and thrombosis of iliac artery: Secondary | ICD-10-CM | POA: Diagnosis present

## 2020-12-27 DIAGNOSIS — H5461 Unqualified visual loss, right eye, normal vision left eye: Secondary | ICD-10-CM | POA: Diagnosis present

## 2020-12-27 DIAGNOSIS — E785 Hyperlipidemia, unspecified: Secondary | ICD-10-CM | POA: Diagnosis present

## 2020-12-27 DIAGNOSIS — D84821 Immunodeficiency due to drugs: Secondary | ICD-10-CM | POA: Diagnosis present

## 2020-12-27 DIAGNOSIS — Z885 Allergy status to narcotic agent status: Secondary | ICD-10-CM

## 2020-12-27 DIAGNOSIS — S81801A Unspecified open wound, right lower leg, initial encounter: Secondary | ICD-10-CM | POA: Diagnosis not present

## 2020-12-27 DIAGNOSIS — I480 Paroxysmal atrial fibrillation: Secondary | ICD-10-CM | POA: Diagnosis present

## 2020-12-27 DIAGNOSIS — Z940 Kidney transplant status: Secondary | ICD-10-CM

## 2020-12-27 DIAGNOSIS — E1159 Type 2 diabetes mellitus with other circulatory complications: Secondary | ICD-10-CM | POA: Diagnosis not present

## 2020-12-27 DIAGNOSIS — G894 Chronic pain syndrome: Secondary | ICD-10-CM | POA: Diagnosis present

## 2020-12-27 DIAGNOSIS — Z79899 Other long term (current) drug therapy: Secondary | ICD-10-CM

## 2020-12-27 DIAGNOSIS — Z8616 Personal history of COVID-19: Secondary | ICD-10-CM

## 2020-12-27 DIAGNOSIS — L03115 Cellulitis of right lower limb: Secondary | ICD-10-CM | POA: Diagnosis present

## 2020-12-27 DIAGNOSIS — I70221 Atherosclerosis of native arteries of extremities with rest pain, right leg: Secondary | ICD-10-CM | POA: Diagnosis present

## 2020-12-27 DIAGNOSIS — I441 Atrioventricular block, second degree: Secondary | ICD-10-CM | POA: Diagnosis present

## 2020-12-27 DIAGNOSIS — E1142 Type 2 diabetes mellitus with diabetic polyneuropathy: Secondary | ICD-10-CM | POA: Diagnosis present

## 2020-12-27 DIAGNOSIS — I739 Peripheral vascular disease, unspecified: Secondary | ICD-10-CM

## 2020-12-27 DIAGNOSIS — R609 Edema, unspecified: Secondary | ICD-10-CM

## 2020-12-27 DIAGNOSIS — I10 Essential (primary) hypertension: Secondary | ICD-10-CM | POA: Diagnosis not present

## 2020-12-27 DIAGNOSIS — I70229 Atherosclerosis of native arteries of extremities with rest pain, unspecified extremity: Secondary | ICD-10-CM | POA: Diagnosis not present

## 2020-12-27 DIAGNOSIS — Z7982 Long term (current) use of aspirin: Secondary | ICD-10-CM

## 2020-12-27 DIAGNOSIS — Z7952 Long term (current) use of systemic steroids: Secondary | ICD-10-CM

## 2020-12-27 DIAGNOSIS — E781 Pure hyperglyceridemia: Secondary | ICD-10-CM | POA: Diagnosis present

## 2020-12-27 DIAGNOSIS — Z95820 Peripheral vascular angioplasty status with implants and grafts: Secondary | ICD-10-CM

## 2020-12-27 DIAGNOSIS — F419 Anxiety disorder, unspecified: Secondary | ICD-10-CM | POA: Diagnosis present

## 2020-12-27 DIAGNOSIS — I70232 Atherosclerosis of native arteries of right leg with ulceration of calf: Secondary | ICD-10-CM | POA: Diagnosis not present

## 2020-12-27 DIAGNOSIS — Z8249 Family history of ischemic heart disease and other diseases of the circulatory system: Secondary | ICD-10-CM

## 2020-12-27 HISTORY — DX: Tobacco use: Z72.0

## 2020-12-27 LAB — CBC WITH DIFFERENTIAL/PLATELET
Abs Immature Granulocytes: 0.04 10*3/uL (ref 0.00–0.07)
Basophils Absolute: 0 10*3/uL (ref 0.0–0.1)
Basophils Relative: 0 %
Eosinophils Absolute: 0.1 10*3/uL (ref 0.0–0.5)
Eosinophils Relative: 1 %
HCT: 44.7 % (ref 39.0–52.0)
Hemoglobin: 14.8 g/dL (ref 13.0–17.0)
Immature Granulocytes: 1 %
Lymphocytes Relative: 17 %
Lymphs Abs: 1.4 10*3/uL (ref 0.7–4.0)
MCH: 28.6 pg (ref 26.0–34.0)
MCHC: 33.1 g/dL (ref 30.0–36.0)
MCV: 86.3 fL (ref 80.0–100.0)
Monocytes Absolute: 0.6 10*3/uL (ref 0.1–1.0)
Monocytes Relative: 8 %
Neutro Abs: 5.9 10*3/uL (ref 1.7–7.7)
Neutrophils Relative %: 73 %
Platelets: 265 10*3/uL (ref 150–400)
RBC: 5.18 MIL/uL (ref 4.22–5.81)
RDW: 15.3 % (ref 11.5–15.5)
WBC: 8.1 10*3/uL (ref 4.0–10.5)
nRBC: 0 % (ref 0.0–0.2)

## 2020-12-27 LAB — COMPREHENSIVE METABOLIC PANEL
ALT: 18 U/L (ref 0–44)
AST: 26 U/L (ref 15–41)
Albumin: 3.2 g/dL — ABNORMAL LOW (ref 3.5–5.0)
Alkaline Phosphatase: 76 U/L (ref 38–126)
Anion gap: 8 (ref 5–15)
BUN: 17 mg/dL (ref 6–20)
CO2: 24 mmol/L (ref 22–32)
Calcium: 9.3 mg/dL (ref 8.9–10.3)
Chloride: 104 mmol/L (ref 98–111)
Creatinine, Ser: 1.43 mg/dL — ABNORMAL HIGH (ref 0.61–1.24)
GFR, Estimated: 56 mL/min — ABNORMAL LOW (ref >60)
Glucose, Bld: 100 mg/dL — ABNORMAL HIGH (ref 70–99)
Potassium: 4 mmol/L (ref 3.5–5.1)
Sodium: 136 mmol/L (ref 135–145)
Total Bilirubin: 0.8 mg/dL (ref 0.3–1.2)
Total Protein: 7.8 g/dL (ref 6.5–8.1)

## 2020-12-27 LAB — RAPID URINE DRUG SCREEN, HOSP PERFORMED
Amphetamines: NOT DETECTED
Barbiturates: NOT DETECTED
Benzodiazepines: NOT DETECTED
Cocaine: POSITIVE — AB
Opiates: NOT DETECTED
Tetrahydrocannabinol: NOT DETECTED

## 2020-12-27 LAB — RESP PANEL BY RT-PCR (FLU A&B, COVID) ARPGX2
Influenza A by PCR: NEGATIVE
Influenza B by PCR: NEGATIVE
SARS Coronavirus 2 by RT PCR: NEGATIVE

## 2020-12-27 LAB — HIV ANTIBODY (ROUTINE TESTING W REFLEX): HIV Screen 4th Generation wRfx: NONREACTIVE

## 2020-12-27 MED ORDER — CEFAZOLIN SODIUM-DEXTROSE 1-4 GM/50ML-% IV SOLN
1.0000 g | Freq: Once | INTRAVENOUS | Status: AC
  Administered 2020-12-27: 1 g via INTRAVENOUS
  Filled 2020-12-27: qty 50

## 2020-12-27 MED ORDER — HYDROCODONE-ACETAMINOPHEN 5-325 MG PO TABS
1.0000 | ORAL_TABLET | Freq: Once | ORAL | Status: DC
  Filled 2020-12-27: qty 1

## 2020-12-27 MED ORDER — HYDROMORPHONE HCL 2 MG PO TABS
2.0000 mg | ORAL_TABLET | ORAL | Status: DC | PRN
  Administered 2020-12-27 – 2020-12-28 (×3): 2 mg via ORAL
  Filled 2020-12-27 (×3): qty 1

## 2020-12-27 MED ORDER — HYDROMORPHONE HCL 2 MG PO TABS
1.0000 mg | ORAL_TABLET | Freq: Once | ORAL | Status: AC
  Administered 2020-12-27: 1 mg via ORAL
  Filled 2020-12-27: qty 1

## 2020-12-27 MED ORDER — HEPARIN BOLUS VIA INFUSION
4500.0000 [IU] | Freq: Once | INTRAVENOUS | Status: AC
  Administered 2020-12-27: 4500 [IU] via INTRAVENOUS
  Filled 2020-12-27: qty 4500

## 2020-12-27 MED ORDER — ADULT MULTIVITAMIN W/MINERALS CH
1.0000 | ORAL_TABLET | Freq: Every day | ORAL | Status: DC
  Administered 2020-12-28 – 2021-01-07 (×11): 1 via ORAL
  Filled 2020-12-27 (×11): qty 1

## 2020-12-27 MED ORDER — MORPHINE SULFATE (PF) 4 MG/ML IV SOLN: 4.0000 mg | Freq: Once | INTRAVENOUS | Status: DC

## 2020-12-27 MED ORDER — SODIUM CHLORIDE 0.9 % IV BOLUS: 500.0000 mL | Freq: Once | INTRAVENOUS | Status: DC

## 2020-12-27 MED ORDER — ACETAMINOPHEN 650 MG RE SUPP: 650.0000 mg | Freq: Four times a day (QID) | RECTAL | Status: DC

## 2020-12-27 MED ORDER — TACROLIMUS 1 MG PO CAPS
3.0000 mg | ORAL_CAPSULE | Freq: Every morning | ORAL | Status: DC
  Administered 2020-12-28 – 2021-01-07 (×11): 3 mg via ORAL
  Filled 2020-12-27 (×11): qty 3

## 2020-12-27 MED ORDER — HYDROMORPHONE HCL 1 MG/ML IJ SOLN
2.0000 mg | Freq: Once | INTRAMUSCULAR | Status: AC
  Administered 2020-12-27: 2 mg via INTRAVENOUS
  Filled 2020-12-27: qty 2

## 2020-12-27 MED ORDER — TACROLIMUS 1 MG PO CAPS: 2.0000 mg | ORAL_CAPSULE | ORAL | Status: DC

## 2020-12-27 MED ORDER — PREDNISONE 5 MG PO TABS
5.0000 mg | ORAL_TABLET | Freq: Every day | ORAL | Status: DC
  Administered 2020-12-27 – 2021-01-07 (×12): 5 mg via ORAL
  Filled 2020-12-27 (×13): qty 1

## 2020-12-27 MED ORDER — HEPARIN (PORCINE) 25000 UT/250ML-% IV SOLN
1500.0000 [IU]/h | INTRAVENOUS | Status: DC
  Administered 2020-12-27: 1200 [IU]/h via INTRAVENOUS
  Administered 2020-12-28 – 2020-12-29 (×2): 1500 [IU]/h via INTRAVENOUS
  Filled 2020-12-27 (×3): qty 250

## 2020-12-27 MED ORDER — ASPIRIN EC 81 MG PO TBEC
81.0000 mg | DELAYED_RELEASE_TABLET | Freq: Every day | ORAL | Status: DC
  Administered 2020-12-27 – 2021-01-07 (×12): 81 mg via ORAL
  Filled 2020-12-27 (×12): qty 1

## 2020-12-27 MED ORDER — ONDANSETRON HCL 4 MG/2ML IJ SOLN
4.0000 mg | Freq: Once | INTRAMUSCULAR | Status: DC
Start: 2020-12-27 — End: 2021-01-04
  Filled 2020-12-27: qty 2

## 2020-12-27 MED ORDER — METOPROLOL TARTRATE 25 MG PO TABS: 25.0000 mg | ORAL_TABLET | Freq: Two times a day (BID) | ORAL | Status: DC

## 2020-12-27 MED ORDER — METRONIDAZOLE IN NACL 5-0.79 MG/ML-% IV SOLN
500.0000 mg | Freq: Four times a day (QID) | INTRAVENOUS | Status: DC
  Administered 2020-12-27 – 2020-12-28 (×3): 500 mg via INTRAVENOUS
  Filled 2020-12-27 (×3): qty 100

## 2020-12-27 MED ORDER — NICOTINE 14 MG/24HR TD PT24
14.0000 mg | MEDICATED_PATCH | Freq: Every day | TRANSDERMAL | Status: DC | PRN
  Administered 2020-12-30: 14 mg via TRANSDERMAL
  Filled 2020-12-27: qty 1

## 2020-12-27 MED ORDER — TAMSULOSIN HCL 0.4 MG PO CAPS
0.4000 mg | ORAL_CAPSULE | Freq: Every day | ORAL | Status: DC
  Administered 2020-12-27 – 2021-01-06 (×11): 0.4 mg via ORAL
  Filled 2020-12-27 (×11): qty 1

## 2020-12-27 MED ORDER — VANCOMYCIN HCL 1250 MG/250ML IV SOLN
1250.0000 mg | INTRAVENOUS | Status: DC
  Administered 2020-12-27: 1250 mg via INTRAVENOUS
  Filled 2020-12-27: qty 250

## 2020-12-27 MED ORDER — TACROLIMUS 1 MG PO CAPS
2.0000 mg | ORAL_CAPSULE | Freq: Every day | ORAL | Status: DC
  Administered 2020-12-27 – 2021-01-06 (×11): 2 mg via ORAL
  Filled 2020-12-27 (×12): qty 2

## 2020-12-27 MED ORDER — MYCOPHENOLATE SODIUM 180 MG PO TBEC
720.0000 mg | DELAYED_RELEASE_TABLET | Freq: Two times a day (BID) | ORAL | Status: DC
  Administered 2020-12-27 – 2021-01-07 (×21): 720 mg via ORAL
  Filled 2020-12-27 (×24): qty 4

## 2020-12-27 MED ORDER — GABAPENTIN 100 MG PO CAPS
200.0000 mg | ORAL_CAPSULE | Freq: Three times a day (TID) | ORAL | Status: DC
  Administered 2020-12-27 – 2021-01-07 (×30): 200 mg via ORAL
  Filled 2020-12-27 (×31): qty 2

## 2020-12-27 MED ORDER — MAGNESIUM OXIDE 400 (241.3 MG) MG PO TABS
400.0000 mg | ORAL_TABLET | Freq: Two times a day (BID) | ORAL | Status: DC
  Administered 2020-12-27 – 2021-01-04 (×15): 400 mg via ORAL
  Filled 2020-12-27 (×15): qty 1

## 2020-12-27 MED ORDER — LABETALOL HCL 5 MG/ML IV SOLN
10.0000 mg | Freq: Once | INTRAVENOUS | Status: AC
  Administered 2020-12-27: 10 mg via INTRAVENOUS
  Filled 2020-12-27: qty 4

## 2020-12-27 MED ORDER — SODIUM CHLORIDE 0.9 % IV BOLUS
500.0000 mL | Freq: Once | INTRAVENOUS | Status: AC
  Administered 2020-12-27: 500 mL via INTRAVENOUS

## 2020-12-27 MED ORDER — PANTOPRAZOLE SODIUM 40 MG PO TBEC
40.0000 mg | DELAYED_RELEASE_TABLET | Freq: Every day | ORAL | Status: DC
  Administered 2020-12-28 – 2021-01-07 (×11): 40 mg via ORAL
  Filled 2020-12-27 (×11): qty 1

## 2020-12-27 MED ORDER — SODIUM BICARBONATE 650 MG PO TABS
1300.0000 mg | ORAL_TABLET | Freq: Two times a day (BID) | ORAL | Status: DC
  Administered 2020-12-27 – 2021-01-07 (×21): 1300 mg via ORAL
  Filled 2020-12-27 (×22): qty 2

## 2020-12-27 MED ORDER — ACETAMINOPHEN 325 MG PO TABS
650.0000 mg | ORAL_TABLET | Freq: Four times a day (QID) | ORAL | Status: DC
  Administered 2020-12-27 – 2021-01-07 (×41): 650 mg via ORAL
  Filled 2020-12-27 (×41): qty 2

## 2020-12-27 MED ORDER — POLYETHYLENE GLYCOL 3350 17 G PO PACK
17.0000 g | PACK | Freq: Every day | ORAL | Status: DC
  Administered 2020-12-28: 17 g via ORAL
  Filled 2020-12-27: qty 1

## 2020-12-27 NOTE — ED Notes (Signed)
Pt transported to MRI 

## 2020-12-27 NOTE — Progress Notes (Signed)
FPTS Interim Progress Note  S: Went to speak with patient regarding trying to attempt the MRI for his right leg once again. Patient reported he initially could not complete it secondary to pain. Nurse had just given him morphine and he felt like he could do the MRI now, but patient fell asleep during the middle of the conversation.  O: BP (!) 193/99 (BP Location: Left Leg)   Pulse 88   Temp 98.4 F (36.9 C)   Resp 18   SpO2 100%   General: well-appearing, NAD, laying on right side in bed CV: BLE warm and dry, pedal pulses not palpable on exam in BLE Skin: extensive scaling of left leg, right leg with pretibial eschar and erythema  A/P: Plan as per H&P. Vascular surgery has assessed the patient with plan for arteriography for evaluation of possible revascularization. As patient had ABI  12/2019 with severe disease and patient is to undergo arteriography, discussed with co-resident Dr. Higinio Plan and canceled the ABI.  - Patient NPO at [place], [place], DO 12/27/2020, 10:21 PM PGY-1, [facility] Service pager [phone]

## 2020-12-27 NOTE — Progress Notes (Signed)
Lower extremity venous RT study completed.  Preliminary results relayed to Caryl Pina, RN and [place], DO.   See CV Proc for preliminary results report.   Darlin Coco, RDMS, RVT
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
## 2020-12-27 NOTE — Progress Notes (Signed)
Pt is refusing MRI. Pt states he cannot extend his legs for any length of time in order to get imaging. [facility] sent to ordering physician.
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
## 2020-12-27 NOTE — ED Notes (Signed)
Vascular at bedside

## 2020-12-27 NOTE — ED Notes (Signed)
Attempted to call report an Nurse is off the floor

## 2020-12-27 NOTE — ED Notes (Signed)
Attempted IV x 2 unsuccessful.
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
## 2020-12-27 NOTE — Progress Notes (Signed)
ANTICOAGULATION CONSULT NOTE - Initial Consult  Pharmacy Consult for heparin Indication: atrial fibrillation and concern for critical limb ischemia  Allergies  Allergen Reactions  . Percocet [Oxycodone-Acetaminophen] Hives and Itching  . Vicodin [Hydrocodone-Acetaminophen] Hives and Itching  . Shellfish Allergy Hives  . Chocolate Flavor Hives  . Chocolate Hives  . Tomato Hives    Patient Measurements:   Heparin Dosing Weight: 82.6 kg  Vital Signs: Temp: 98.1 F (36.7 C) (03/29 0802) Temp Source: Oral (03/29 0802) BP: 179/166 (03/29 1351) Pulse Rate: 82 (03/29 1351)  Labs: Recent Labs    12/27/20 0539  HGB 14.8  HCT 44.7  PLT 265  CREATININE 1.43*    CrCl cannot be calculated (Unknown ideal weight.).   Medical History: Past Medical History:  Diagnosis Date  . Anxiety   . Arthritis    "lower back" (11/28/2017)  . CAD (coronary artery disease)   . Chronic lower back pain   . H/O immunosuppressive therapy    chronic/notes 11/28/2017  . History of gout    "before kidney transport" (11/28/2017)  . Hyperlipidemia   . Hypertension   . Kidney disease    s/p kidney transplant 2011; "not on dialysis now" (11/28/2017)    Assessment: 86 YOM admitted with worsening RLE pain concerning for critical limb ischemia on Xarelto 2.5 mg BID PTA for PAD (LD >12 hours ago). Pt in intermittent atrial fibrillation with no h/o. Pharmacy has been consulted to dose heparin. Will bolus at lower end of goal range for afib given intermittent atrial fibrillation and current PTA Xarelto for PAD. CBC WNL.   Goal of Therapy:  Heparin level 0.3-0.7 units/ml aPTT 66-102 seconds Monitor platelets by anticoagulation protocol: Yes   Plan:  Heparin 4500 units x1, followed by 1200 units/hr aPTT level in 6-8 hours Daily aPTT/heparin level, CBC  Romilda Garret, PharmD PGY1 Acute Care Pharmacy Resident 12/27/2020 4:28 PM  Please check [email or web address] for unit specific pharmacy phone numbers.

## 2020-12-27 NOTE — Progress Notes (Addendum)
Pharmacy Antibiotic Note  Theodore Arellano is a 60 y.o. male admitted on 12/27/2020 with cellulitis with hx of chronic LLE wound. Pharmacy has been consulted for vancomycin dosing. Patient has a history of renal transplant with Scr 1.43 (BL ~1.3-1.4).  Plan: Vancomycin 1250 mg IV every 24 hours (eAUC 433, goal AUC 400-550) Monitor renal function, C&S, clinical status, vanc levels as indicated    Temp (24hrs), Avg:98.2 F (36.8 C), Min:98.1 F (36.7 C), Max:98.3 F (36.8 C)  Recent Labs  Lab 12/27/20 0539  WBC 8.1  CREATININE 1.43*    CrCl cannot be calculated (Unknown ideal weight.).    Allergies  Allergen Reactions  . Percocet [Oxycodone-Acetaminophen] Hives and Itching  . Vicodin [Hydrocodone-Acetaminophen] Hives and Itching  . Shellfish Allergy Hives  . Chocolate Flavor Hives  . Chocolate Hives  . Tomato Hives    Antimicrobials this admission: Vancomycin 3/29> Flagyl 3/29>  Dose adjustments this admission: N/A  Microbiology results: 3/29 Bcx:  Thank you for allowing pharmacy to be a part of this patient's care.  Romilda Garret, PharmD PGY1 Acute Care Pharmacy Resident Phone: [phone] 12/27/2020 4:53 PM  Please check [email or web address] for unit specific pharmacy phone numbers.

## 2020-12-27 NOTE — ED Notes (Signed)
Patient transported to MRI 

## 2020-12-27 NOTE — ED Notes (Signed)
This RN unable to establish IV access, IV team consult placed. ED provider made aware.

## 2020-12-27 NOTE — ED Notes (Signed)
Pt transported to xray 
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
## 2020-12-28 ENCOUNTER — Inpatient Hospital Stay (HOSPITAL_COMMUNITY): Payer: Medicare Other

## 2020-12-28 DIAGNOSIS — Z9114 Patient's other noncompliance with medication regimen: Secondary | ICD-10-CM | POA: Diagnosis not present

## 2020-12-28 DIAGNOSIS — L03115 Cellulitis of right lower limb: Secondary | ICD-10-CM | POA: Diagnosis not present

## 2020-12-28 DIAGNOSIS — I10 Essential (primary) hypertension: Secondary | ICD-10-CM | POA: Diagnosis not present

## 2020-12-28 DIAGNOSIS — I739 Peripheral vascular disease, unspecified: Secondary | ICD-10-CM | POA: Diagnosis not present

## 2020-12-28 LAB — HEMOGLOBIN A1C
Hgb A1c MFr Bld: 6.4 % — ABNORMAL HIGH (ref 4.8–5.6)
Mean Plasma Glucose: 136.98 mg/dL

## 2020-12-28 LAB — SEDIMENTATION RATE: Sed Rate: 49 mm/hr — ABNORMAL HIGH (ref 0–16)

## 2020-12-28 LAB — BASIC METABOLIC PANEL
Anion gap: 9 (ref 5–15)
BUN: 18 mg/dL (ref 6–20)
CO2: 22 mmol/L (ref 22–32)
Calcium: 9.2 mg/dL (ref 8.9–10.3)
Chloride: 106 mmol/L (ref 98–111)
Creatinine, Ser: 1.5 mg/dL — ABNORMAL HIGH (ref 0.61–1.24)
GFR, Estimated: 53 mL/min — ABNORMAL LOW (ref >60)
Glucose, Bld: 95 mg/dL (ref 70–99)
Potassium: 4.3 mmol/L (ref 3.5–5.1)
Sodium: 137 mmol/L (ref 135–145)

## 2020-12-28 LAB — C-REACTIVE PROTEIN: CRP: 10.7 mg/dL — ABNORMAL HIGH (ref <1.0)

## 2020-12-28 LAB — CBC
HCT: 47.8 % (ref 39.0–52.0)
Hemoglobin: 14.8 g/dL (ref 13.0–17.0)
MCH: 28 pg (ref 26.0–34.0)
MCHC: 31 g/dL (ref 30.0–36.0)
MCV: 90.4 fL (ref 80.0–100.0)
Platelets: 214 10*3/uL (ref 150–400)
RBC: 5.29 MIL/uL (ref 4.22–5.81)
RDW: 15.5 % (ref 11.5–15.5)
WBC: 6.7 10*3/uL (ref 4.0–10.5)
nRBC: 0 % (ref 0.0–0.2)

## 2020-12-28 LAB — LIPID PANEL
Cholesterol: 150 mg/dL (ref 0–200)
HDL: 66 mg/dL (ref >40)
LDL Cholesterol: 60 mg/dL (ref 0–99)
Total CHOL/HDL Ratio: 2.3 RATIO
Triglycerides: 122 mg/dL (ref <150)
VLDL: 24 mg/dL (ref 0–40)

## 2020-12-28 LAB — HEPARIN LEVEL (UNFRACTIONATED)
Heparin Unfractionated: 0.55 IU/mL (ref 0.30–0.70)
Heparin Unfractionated: 0.43 IU/mL (ref 0.30–0.70)

## 2020-12-28 LAB — APTT
aPTT: 39 seconds — ABNORMAL HIGH (ref 24–36)
aPTT: 85 seconds — ABNORMAL HIGH (ref 24–36)

## 2020-12-28 LAB — TSH: TSH: 1.86 u[IU]/mL (ref 0.350–4.500)

## 2020-12-28 MED ORDER — POLYETHYLENE GLYCOL 3350 17 G PO PACK
17.0000 g | PACK | Freq: Two times a day (BID) | ORAL | Status: DC | PRN
  Administered 2020-12-28: 17 g via ORAL
  Filled 2020-12-28: qty 1

## 2020-12-28 MED ORDER — HEPARIN BOLUS VIA INFUSION
3000.0000 [IU] | Freq: Once | INTRAVENOUS | Status: AC
  Administered 2020-12-28: 3000 [IU] via INTRAVENOUS
  Filled 2020-12-28: qty 3000

## 2020-12-28 MED ORDER — CARVEDILOL 6.25 MG PO TABS
6.2500 mg | ORAL_TABLET | Freq: Two times a day (BID) | ORAL | Status: DC
  Administered 2020-12-28 – 2020-12-30 (×4): 6.25 mg via ORAL
  Filled 2020-12-28 (×5): qty 1

## 2020-12-28 MED ORDER — SODIUM CHLORIDE 0.9 % IV SOLN: INTRAVENOUS | Status: DC

## 2020-12-28 MED ORDER — AMOXICILLIN-POT CLAVULANATE 875-125 MG PO TABS
1.0000 | ORAL_TABLET | Freq: Two times a day (BID) | ORAL | Status: AC
  Administered 2020-12-28 – 2021-01-01 (×9): 1 via ORAL
  Filled 2020-12-28 (×9): qty 1

## 2020-12-28 MED ORDER — LORAZEPAM 2 MG/ML IJ SOLN
1.0000 mg | Freq: Once | INTRAMUSCULAR | Status: AC
  Administered 2020-12-28: 1 mg via INTRAVENOUS
  Filled 2020-12-28 (×2): qty 1

## 2020-12-28 MED ORDER — ROSUVASTATIN CALCIUM 20 MG PO TABS
20.0000 mg | ORAL_TABLET | Freq: Every day | ORAL | Status: DC
  Administered 2020-12-28 – 2021-01-06 (×10): 20 mg via ORAL
  Filled 2020-12-28 (×12): qty 1

## 2020-12-28 MED ORDER — HYDROMORPHONE HCL 2 MG PO TABS
1.0000 mg | ORAL_TABLET | ORAL | Status: DC | PRN
  Administered 2020-12-28 – 2020-12-30 (×10): 2 mg via ORAL
  Filled 2020-12-28 (×10): qty 1

## 2020-12-28 NOTE — NC FL2 (Signed)
[place] LEVEL OF CARE SCREENING TOOL     IDENTIFICATION  Patient Name: Theodore Arellano Birthdate: 07-06-1961 Sex: male Admission Date (Current Location): 12/27/2020  [facility] and [place] Number:  Oval Linsey (Patient lives in [place]) [id number] P Facility and Address:  [facility] [place], [place] [address], [place], [place] [address]      Provider Number: [id number]  Attending Physician Name and Address:  Martyn Malay, MD  Relative Name and Phone Number:  Garald Balding - daughter - [phone]    Current Level of Care: Hospital Recommended Level of Care: [place] Prior Approval Number:    Date Approved/Denied:   PASRR Number: [id number] (Eff. 07/09/19)  Discharge Plan: SNF    Current Diagnoses: Patient Active Problem List   Diagnosis Date Noted  . Leg pain, anterior, right 12/27/2020  . Non-healing wound of right lower extremity 12/27/2020  . Cellulitis of right lower extremity 12/27/2020  . Arterial leg ulcer ([place]) 12/27/2020  . 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 Cellulitis of left lower extremity 01/11/2020  . Sepsis ([place]) 01/11/2020  . DM (diabetes mellitus) type II, controlled, with peripheral vascular disorder ([place]) 01/11/2020  . Chronic pain syndrome 01/11/2020  . Sepsis due to cellulitis ([place]) 01/11/2020  . ESRD (end stage renal disease) ([place]) 01/06/2020  . Acute kidney injury ([place]) 11/08/2019  . COVID-19 virus infection 11/08/2019  . History of DVT (deep vein thrombosis) 11/08/2019  . Hx of CABG 09/04/2019  . Disorder of arteries and arterioles, unspecified ([place]) 07/21/2019  . Wound of left leg 07/21/2019  . Left leg pain 06/28/2019  . Abnormal ECG 06/05/2019  . Mobitz type I Wenckebach atrioventricular block 06/05/2019  . Unstable angina pectoris due to coronary arteriosclerosis ([place]) 05/30/2019  . Left ventricular dysfunction 01/08/2018  . Coronary artery disease involving  native coronary artery of native heart without angina pectoris   . Coronary artery disease due to lipid rich plaque   . Elevated troponin   . Tobacco use 09/02/2017  . Chest pain 08/27/2016  . Renal transplant recipient 08/27/2016  . Essential hypertension 08/27/2016  . HLD (hyperlipidemia) 08/27/2016  . Type 2 diabetes mellitus with vascular disease ([place]) 08/27/2016  . Abnormal nuclear stress test 11/02/2015  . CKD (chronic kidney disease) 11/02/2015  . Current smoker 11/02/2015  . Dyslipidemia 11/02/2015  . Immunosuppression ([place]) 09/02/2014  . Rash 09/02/2014  . Left groin pain 06/24/2013  . Anemia 05/21/2012  . Leucopenia 05/21/2012  . Personal history of immunosupression therapy 05/21/2012    Orientation RESPIRATION BLADDER Height & Weight     Self,Situation,Place,Time  Normal Continent Weight:   Height:  5\' 7"  (170.2 cm)  BEHAVIORAL SYMPTOMS/MOOD NEUROLOGICAL BOWEL NUTRITION STATUS      Continent Diet (Heart-healthy)  AMBULATORY STATUS COMMUNICATION OF NEEDS Skin   Limited Assist (Min assist per PT) Verbally Other (Comment) (Right pretibial right vertical open wound with black eschar and surrounding erythema.  Cellulitis left leg; Abrasion left and right nee)                       Personal Care Assistance Level of Assistance  Bathing,Feeding,Dressing Bathing Assistance: Limited assistance (Lower body Min guard assistance) Feeding assistance: Limited assistance (Assistance with set-up) Dressing Assistance: Limited assistance (Lower body min guard assistance)     Functional Limitations Info  Sight,Hearing,Speech Sight Info: Impaired (Blind right eye) Hearing Info: Adequate Speech Info: Adequate    SPECIAL CARE FACTORS FREQUENCY  PT (By licensed  PT),OT (By licensed OT)     PT Frequency: Evaluated 3/30. PT at SNF a minimum of 5 days per week OT Frequency: Evaluated 3/30. OT at SNF a minimum of 5 days per week            Contractures Contractures Info:  Not present    Additional Factors Info  Code Status,Allergies Code Status Info: Full Allergies Info: Percocet (Oxycodone-acetaminophen), Vicodin (Hydrocodone-acetaminophen), Shellfish Allergy, Chocolate Flavor, Chocolate, Tomato           Current Medications (12/28/2020):  This is the current hospital active medication list Current Facility-Administered Medications  Medication Dose Route Frequency Provider Last Rate Last Admin  . 0.9 %  sodium chloride infusion   Intravenous Continuous Espinoza, Alejandra, DO      . acetaminophen (TYLENOL) tablet 650 mg  650 mg Oral Q6H Espinoza, Alejandra, DO   650 mg at 12/28/20 1047   Or  . acetaminophen (TYLENOL) suppository 650 mg  650 mg Rectal Q6H Espinoza, Alejandra, DO      . amoxicillin-clavulanate (AUGMENTIN) 875-125 MG per tablet 1 tablet  1 tablet Oral Q12H Espinoza, Alejandra, DO      . aspirin EC tablet 81 mg  81 mg Oral Daily Espinoza, Alejandra, DO   81 mg at 12/28/20 1037  . carvedilol (COREG) tablet 6.25 mg  6.25 mg Oral BID WC Espinoza, Alejandra, DO      . gabapentin (NEURONTIN) capsule 200 mg  200 mg Oral TID Sharion Settler, DO   200 mg at 12/28/20 1037  . heparin ADULT infusion 100 units/mL (25000 units/2107mL)  1,500 Units/hr Intravenous Continuous Martyn Malay, MD 15 mL/hr at 12/28/20 1116 1,500 Units/hr at 12/28/20 1116  . HYDROmorphone (DILAUDID) tablet 1-2 mg  1-2 mg Oral Q4H PRN Martyn Malay, MD      . magnesium oxide (MAG-OX) tablet 400 mg  400 mg Oral BID Espinoza, Alejandra, DO   400 mg at 12/28/20 1037  . multivitamin with minerals tablet 1 tablet  1 tablet Oral Daily Espinoza, Alejandra, DO   1 tablet at 12/28/20 1037  . mycophenolate (MYFORTIC) EC tablet 720 mg  720 mg Oral BID Espinoza, Alejandra, DO   720 mg at 12/28/20 1037  . nicotine (NICODERM CQ - dosed in mg/24 hours) patch 14 mg  14 mg Transdermal Daily PRN Sharion Settler, DO      . ondansetron (ZOFRAN) injection 4 mg  4 mg Intravenous Once  Espinoza, Alejandra, DO      . pantoprazole (PROTONIX) EC tablet 40 mg  40 mg Oral Daily Espinoza, Alejandra, DO   40 mg at 12/28/20 1037  . polyethylene glycol (MIRALAX / GLYCOLAX) packet 17 g  17 g Oral Daily Espinoza, Alejandra, DO   17 g at 12/28/20 1037  . predniSONE (DELTASONE) tablet 5 mg  5 mg Oral Daily Espinoza, Alejandra, DO   5 mg at 12/28/20 1037  . rosuvastatin (CRESTOR) tablet 20 mg  20 mg Oral q1800 Lattie Haw, MD   20 mg at 12/28/20 1307  . sodium bicarbonate tablet 1,300 mg  1,300 mg Oral BID Espinoza, Alejandra, DO   1,300 mg at 12/28/20 1037  . tacrolimus (PROGRAF) capsule 3 mg  3 mg Oral q AM Martyn Malay, MD   3 mg at 12/28/20 1038   And  . tacrolimus (PROGRAF) capsule 2 mg  2 mg Oral QHS Martyn Malay, MD   2 mg at 12/27/20 2211  . tamsulosin (FLOMAX) capsule 0.4 mg  0.4 mg  Oral QHS Sharion Settler, DO   0.4 mg at 12/27/20 2210     Discharge Medications: Please see discharge summary for a list of discharge medications.  Relevant Imaging Results:  Relevant Lab Results:   Additional Information ss#[id number]; ESRD not on HD. Pfizer vaccinations: 5/6, 6/1 and 07/06/20.  Sable Feil, LCSW

## 2020-12-28 NOTE — Progress Notes (Signed)
Patient ID: Theodore Arellano, male   DOB: 11-22-1960, 60 y.o.   MRN: [id number] Back from MRI.  Resting comfortably. MRI results pending For arteriogram tomorrow.  Did not have room on the schedule for today. We will make revascularization plans pending tomorrow study

## 2020-12-28 NOTE — TOC Initial Note (Signed)
Transition of Care [facility]) - Initial/Assessment Note    Patient Details  Name: Theodore Arellano MRN: [id number] Date of Birth: Aug 23, 1961  Transition of Care [facility]) CM/SW Contact:    Sable Feil, LCSW Phone Number: 12/28/2020, 4:07 PM  Clinical Narrative:  CSW talked with patient at the bedside regarding his discharge disposition and recommendation of ST rehab. Patient was dozing when CSW entered room, but awakened when his name was called and was agreeable to talking with CSW. CSW explained recommendation of ST rehab and when asked, Theodore Arellano responded that he has been to a facility in [facility] and wants the search in that county. Patient reported that he has never been married and has a daughter Theodore Arellano and son Theodore Arellano.    The facility search process was explained and patient was provided with a ist of [facility] skilled nursing facilities.                Expected Discharge Plan: [place] Barriers to Discharge: Continued Medical Work up   Patient Goals and CMS Choice Patient states their goals for this hospitalization and ongoing recovery are:: Patient agreeable to ST rehab before going home CMS Medicare.gov Compare Post Acute Care list provided to:: Patient Choice offered to / list presented to : Patient  Expected Discharge Plan and Services Expected Discharge Plan: [place] In-house Referral: Clinical Social Work     Living arrangements for the past 2 months: [place]                                      Prior Living Arrangements/Services Living arrangements for the past 2 months: Single Family Home Lives with:: Self Patient language and need for interpreter reviewed:: Yes Do you feel safe going back to the place where you live?: No   Patient agreeable to [facility] rehab before returning home  Need for Family Participation in Patient Care: No (Comment) Care giver support system in place?: No (comment)    Criminal Activity/Legal Involvement Pertinent to Current Situation/Hospitalization: No - Comment as needed  Activities of Daily Living Home Assistive Devices/Equipment: Cane (specify quad or straight) ADL Screening (condition at time of admission) Patient's cognitive ability adequate to safely complete daily activities?: Yes Is the patient deaf or have difficulty hearing?: No Does the patient have difficulty seeing, even when wearing glasses/contacts?: No Does the patient have difficulty concentrating, remembering, or making decisions?: No Patient able to express need for assistance with ADLs?: Yes Does the patient have difficulty dressing or bathing?: No Independently performs ADLs?: Yes (appropriate for developmental age) Does the patient have difficulty walking or climbing stairs?: Yes Weakness of Legs: Both Weakness of Arms/Hands: None  Permission Sought/Granted Permission sought to share information with : Family Supports Permission granted to share information with : Yes, Verbal Permission Granted  Share Information with NAME: Theodore Arellano     Permission granted to share info w Relationship: Daughter  Permission granted to share info w Contact Information: [phone]  Emotional Assessment Appearance:: Appears stated age Attitude/Demeanor/Rapport: Engaged (Patient was sleepy but engaged briefly in conversation with CSW) Affect (typically observed): Appropriate Orientation: : Oriented to Self,Oriented to Place,Oriented to Situation Alcohol / Substance Use: Tobacco Use,Alcohol Use,Illicit Drugs (Per H&P patient smokes, drinks 12 cans of beer per week and does not use illicit drugs) Psych Involvement: No (comment)  Admission diagnosis:  PAD (peripheral artery disease) ([place]) [  I73.9] Anticoagulant long-term use [Z79.01] Cellulitis of right lower extremity [L03.115] Non compliance w medication regimen [Z91.14] Leg pain, anterior, right [M79.604] Hypertension, unspecified  type [I10] Patient Active Problem List   Diagnosis Date Noted  . Leg pain, anterior, right 12/27/2020  . Non-healing wound of right lower extremity 12/27/2020  . Cellulitis of right lower extremity 12/27/2020  . Arterial leg ulcer ([place]) 12/27/2020  . 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 Cellulitis of left lower extremity 01/11/2020  . Sepsis ([place]) 01/11/2020  . DM (diabetes mellitus) type II, controlled, with peripheral vascular disorder ([place]) 01/11/2020  . Chronic pain syndrome 01/11/2020  . Sepsis due to cellulitis ([place]) 01/11/2020  . ESRD (end stage renal disease) ([place]) 01/06/2020  . Acute kidney injury ([place]) 11/08/2019  . COVID-19 virus infection 11/08/2019  . History of DVT (deep vein thrombosis) 11/08/2019  . Hx of CABG 09/04/2019  . Disorder of arteries and arterioles, unspecified ([place]) 07/21/2019  . Wound of left leg 07/21/2019  . Left leg pain 06/28/2019  . Abnormal ECG 06/05/2019  . Mobitz type I Wenckebach atrioventricular block 06/05/2019  . Unstable angina pectoris due to coronary arteriosclerosis ([place]) 05/30/2019  . Left ventricular dysfunction 01/08/2018  . Coronary artery disease involving native coronary artery of native heart without angina pectoris   . Coronary artery disease due to lipid rich plaque   . Elevated troponin   . Tobacco use 09/02/2017  . Chest pain 08/27/2016  . Renal transplant recipient 08/27/2016  . Essential hypertension 08/27/2016  . HLD (hyperlipidemia) 08/27/2016  . Type 2 diabetes mellitus with vascular disease ([place]) 08/27/2016  . Abnormal nuclear stress test 11/02/2015  . CKD (chronic kidney disease) 11/02/2015  . Current smoker 11/02/2015  . Dyslipidemia 11/02/2015  . Immunosuppression ([place]) 09/02/2014  . Rash 09/02/2014  . Left groin pain 06/24/2013  . Anemia 05/21/2012  . Leucopenia 05/21/2012  . Personal history of immunosupression therapy 05/21/2012   PCP:  [name], MD Pharmacy:    [id number] - [place], [place] [address] Phone: [phone] Fax: [phone]    Social Determinants of Health (SDOH) Interventions  No SDOH interventions requested or needed at this time.  Readmission Risk Interventions No flowsheet data found.

## 2020-12-28 NOTE — Progress Notes (Signed)
ANTICOAGULATION CONSULT NOTE - Initial Consult  Pharmacy Consult for heparin Indication: limb ischemia   Allergies  Allergen Reactions  . Percocet [Oxycodone-Acetaminophen] Hives and Itching  . Vicodin [Hydrocodone-Acetaminophen] Hives and Itching  . Shellfish Allergy Hives  . Chocolate Flavor Hives  . Chocolate Hives  . Tomato Hives    Patient Measurements: Heparin Dosing Weight: 82.6 kg  Vital Signs: Temp: 98.2 F (36.8 C) (03/30 0350) Temp Source: Oral (03/29 2119) BP: 133/69 (03/30 0350) Pulse Rate: 60 (03/30 0350)  Labs: Recent Labs    12/27/20 0539 12/28/20 0356 12/28/20 1314  HGB 14.8 14.8  --   HCT 44.7 47.8  --   PLT 265 214  --   APTT  --  39* 85*  HEPARINUNFRC  --   --  0.55  CREATININE 1.43* 1.50*  --     CrCl cannot be calculated (Unknown ideal weight.).  Assessment: 60 y.o. male with RLE ischemia, h/o significant vascular disease with bypass on left leg, PAD. Rivaroxaban on hold (last dose 3/28 at 0800). Pharmacy consulted for IV heparin.   No signs of bleeding noted. Hgb stable at 14.8, PLT at 214. Heparin level 0.55 is therapeutic, correlates with APTT of 85, and it is 48 hours since last rivaroxaban dose. Can monitor with heparin levels now.   Goal of Therapy:  Heparin level 0.3-0.7 units/ml aPTT 66-102 seconds Monitor platelets by anticoagulation protocol: Yes   Plan:  Continue heparin at 1500 units/hr Confirmatory heparin level in 6 hours.  Monitor signs of bleeding, daily CBC  Norina Buzzard, PharmD PGY1 Pharmacy Resident 12/28/2020 8:57 AM

## 2020-12-28 NOTE — Plan of Care (Signed)
  Problem: Education: Goal: Knowledge of General Education information will improve Description Including pain rating scale, medication(s)/side effects and non-pharmacologic comfort measures Outcome: Progressing   Problem: Health Behavior/Discharge Planning: Goal: Ability to manage health-related needs will improve Outcome: Progressing   

## 2020-12-28 NOTE — Progress Notes (Signed)
ANTICOAGULATION CONSULT NOTE - Initial Consult  Pharmacy Consult for heparin Indication: limb ischemia   Allergies  Allergen Reactions  . Percocet [Oxycodone-Acetaminophen] Hives and Itching  . Vicodin [Hydrocodone-Acetaminophen] Hives and Itching  . Shellfish Allergy Hives  . Chocolate Flavor Hives  . Chocolate Hives  . Tomato Hives    Patient Measurements:   Heparin Dosing Weight: 82.6 kg  Vital Signs: Temp: 98.4 F (36.9 C) (03/29 2137) Temp Source: Oral (03/29 2119) BP: 139/74 (03/29 2347) Pulse Rate: 66 (03/29 2347)  Labs: Recent Labs    12/27/20 0539 12/28/20 0356  HGB 14.8 14.8  HCT 44.7 47.8  PLT 265 214  APTT  --  39*  CREATININE 1.43*  --     CrCl cannot be calculated (Unknown ideal weight.).  Assessment: 60 y.o. male with RLE ischemia, h/o DVT and PAD and Xarelto on hold, for heparin  Goal of Therapy:  Heparin level 0.3-0.7 units/ml aPTT 66-102 seconds Monitor platelets by anticoagulation protocol: Yes   Plan:  Heparin 3000 units IV bolus, then increase heparin  1500 units/hr Check heparin level in 6 hours.   Phillis Knack, PharmD, BCPS

## 2020-12-28 NOTE — Plan of Care (Signed)
  Problem: Education: Goal: Knowledge of General Education information will improve Description Including pain rating scale, medication(s)/side effects and non-pharmacologic comfort measures Outcome: Progressing   

## 2020-12-28 NOTE — Progress Notes (Signed)
New Admission Note:   Arrival Method: via stretcher from ED Mental Orientation: Alert and oriented x4 Telemetry: 35m10, CCMD notified Assessment: to be completed Skin: refer to flowsheet IV: LUA infusing with heparin (25000/238ml) at 85ml/hr and LFA, nsl Pain: 7/10, pain medication given Tubes: None Safety Measures: Safety Fall Prevention Plan has been discussed  Admission: to be completed 5 [place] Orientation: Patient has been oriented to the room, unit and staff.   Family: none at bedside  Orders to be reviewed and implemented. Will continue to monitor the patient. Call light has been placed within reach and bed alarm has been activated.
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
## 2020-12-28 NOTE — Progress Notes (Addendum)
ANTICOAGULATION CONSULT NOTE - Follow Up Consult  Pharmacy Consult for IV Heparin Indication:  Limb ischemia   Allergies  Allergen Reactions  . Percocet [Oxycodone-Acetaminophen] Hives and Itching  . Vicodin [Hydrocodone-Acetaminophen] Hives and Itching  . Shellfish Allergy Hives  . Chocolate Flavor Hives  . Chocolate Hives  . Tomato Hives    Patient Measurements: Heparin Dosing Weight: 82.6 kg  Vital Signs: Temp: 98.4 F (36.9 C) (03/30 1935) Temp Source: Oral (03/30 1935) BP: 176/118 (03/30 1935) Pulse Rate: 76 (03/30 1935)  Labs: Recent Labs    12/27/20 0539 12/28/20 0356 12/28/20 1314 12/28/20 1914  HGB 14.8 14.8  --   --   HCT 44.7 47.8  --   --   PLT 265 214  --   --   APTT  --  39* 85*  --   HEPARINUNFRC  --   --  0.55 0.43  CREATININE 1.43* 1.50*  --   --     CrCl cannot be calculated (Unknown ideal weight.).  Assessment: 60 y.o. male with RLE ischemia, hx of significant vascular disease with bypass on left leg, PAD. Rivaroxaban on hold (last dose 3/28 at 0800). Pharmacy was consulted for IV heparin dosing.   Earlier today, heparin level was therapeutic at 0.55 units/ml and correlated with aPTT of 85 sec. It is >48 hours since last rivaroxaban dose; will monitor anticoagulation with heparin levels going forward.  Confirmatory heparin level drawn 6 hrs after earlier therapeutic level was 0.43 units/ml, which remains within the goal range for this pt. H/H 14.8/47.8, plt 214 . Per RN, no issues with IV or bleeding observed.  Goal of Therapy:  Heparin level: 0.3-0.7 units/ml Monitor platelets by anticoagulation protocol: Yes   Plan:  Continue heparin at 1500 units/hr Monitor daily heparin level, CBC Monitor for bleeding F/U transition back to rivaroxaban when able  Gillermina Hu, PharmD, BCPS, [facility] Clinical Pharmacist 12/28/2020 9:02 PM
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
## 2020-12-29 ENCOUNTER — Encounter (HOSPITAL_COMMUNITY)
Admission: EM | Disposition: A | Discharge status: Skilled Nursing Facility | Payer: Self-pay | Source: Home / Self Care | Attending: Family Medicine

## 2020-12-29 DIAGNOSIS — S81801A Unspecified open wound, right lower leg, initial encounter: Secondary | ICD-10-CM | POA: Diagnosis not present

## 2020-12-29 DIAGNOSIS — I70232 Atherosclerosis of native arteries of right leg with ulceration of calf: Secondary | ICD-10-CM

## 2020-12-29 HISTORY — PX: ABDOMINAL AORTOGRAM W/LOWER EXTREMITY: CATH118223

## 2020-12-29 LAB — RENAL FUNCTION PANEL
Albumin: 2.9 g/dL — ABNORMAL LOW (ref 3.5–5.0)
Anion gap: 9 (ref 5–15)
BUN: 14 mg/dL (ref 6–20)
CO2: 23 mmol/L (ref 22–32)
Calcium: 9.1 mg/dL (ref 8.9–10.3)
Chloride: 104 mmol/L (ref 98–111)
Creatinine, Ser: 1.26 mg/dL — ABNORMAL HIGH (ref 0.61–1.24)
GFR, Estimated: >60 mL/min (ref >60)
Glucose, Bld: 99 mg/dL (ref 70–99)
Phosphorus: 3.3 mg/dL (ref 2.5–4.6)
Potassium: 3.8 mmol/L (ref 3.5–5.1)
Sodium: 136 mmol/L (ref 135–145)

## 2020-12-29 LAB — CBC
HCT: 44.5 % (ref 39.0–52.0)
Hemoglobin: 14.5 g/dL (ref 13.0–17.0)
MCH: 28 pg (ref 26.0–34.0)
MCHC: 32.6 g/dL (ref 30.0–36.0)
MCV: 86.1 fL (ref 80.0–100.0)
Platelets: 236 10*3/uL (ref 150–400)
RBC: 5.17 MIL/uL (ref 4.22–5.81)
RDW: 14.7 % (ref 11.5–15.5)
WBC: 6.5 10*3/uL (ref 4.0–10.5)
nRBC: 0 % (ref 0.0–0.2)

## 2020-12-29 LAB — HEPARIN LEVEL (UNFRACTIONATED): Heparin Unfractionated: 0.55 IU/mL (ref 0.30–0.70)

## 2020-12-29 LAB — TACROLIMUS LEVEL: Tacrolimus (FK506) - LabCorp: 4.3 ng/mL (ref 2.0–20.0)

## 2020-12-29 SURGERY — ABDOMINAL AORTOGRAM W/LOWER EXTREMITY
Anesthesia: LOCAL | Laterality: Bilateral

## 2020-12-29 MED ORDER — HYDRALAZINE HCL 20 MG/ML IJ SOLN
5.0000 mg | INTRAMUSCULAR | Status: AC | PRN
  Administered 2020-12-29 – 2020-12-30 (×2): 5 mg via INTRAVENOUS
  Filled 2020-12-29 (×2): qty 1

## 2020-12-29 MED ORDER — LIDOCAINE HCL (PF) 1 % IJ SOLN
INTRAMUSCULAR | Status: AC
  Filled 2020-12-29: qty 30

## 2020-12-29 MED ORDER — LIDOCAINE HCL (PF) 1 % IJ SOLN
INTRAMUSCULAR | Status: DC | PRN
  Administered 2020-12-29: 15 mL

## 2020-12-29 MED ORDER — HEPARIN (PORCINE) 25000 UT/250ML-% IV SOLN
1600.0000 [IU]/h | INTRAVENOUS | Status: DC
  Administered 2020-12-30: 1500 [IU]/h via INTRAVENOUS
  Administered 2020-12-30 – 2021-01-03 (×6): 1600 [IU]/h via INTRAVENOUS
  Filled 2020-12-29 (×8): qty 250

## 2020-12-29 MED ORDER — LABETALOL HCL 5 MG/ML IV SOLN
INTRAVENOUS | Status: AC
  Filled 2020-12-29: qty 4

## 2020-12-29 MED ORDER — SODIUM CHLORIDE 0.9 % IV SOLN: 250.0000 mL | INTRAVENOUS | Status: DC | PRN

## 2020-12-29 MED ORDER — MIDAZOLAM HCL 2 MG/2ML IJ SOLN
INTRAMUSCULAR | Status: DC | PRN
  Administered 2020-12-29: 2 mg via INTRAVENOUS

## 2020-12-29 MED ORDER — HYDRALAZINE HCL 20 MG/ML IJ SOLN
INTRAMUSCULAR | Status: AC
  Filled 2020-12-29: qty 1

## 2020-12-29 MED ORDER — HEPARIN (PORCINE) IN NACL 1000-0.9 UT/500ML-% IV SOLN
INTRAVENOUS | Status: DC | PRN
  Administered 2020-12-29: 500 mL

## 2020-12-29 MED ORDER — FENTANYL CITRATE (PF) 100 MCG/2ML IJ SOLN
INTRAMUSCULAR | Status: DC | PRN
  Administered 2020-12-29: 50 ug via INTRAVENOUS

## 2020-12-29 MED ORDER — FENTANYL CITRATE (PF) 100 MCG/2ML IJ SOLN
INTRAMUSCULAR | Status: AC
  Filled 2020-12-29: qty 2

## 2020-12-29 MED ORDER — SODIUM CHLORIDE 0.9% FLUSH
3.0000 mL | Freq: Two times a day (BID) | INTRAVENOUS | Status: DC
  Administered 2020-12-29 – 2021-01-07 (×11): 3 mL via INTRAVENOUS

## 2020-12-29 MED ORDER — LABETALOL HCL 5 MG/ML IV SOLN
10.0000 mg | INTRAVENOUS | Status: DC | PRN
  Administered 2020-12-29 – 2020-12-30 (×3): 10 mg via INTRAVENOUS
  Filled 2020-12-29 (×2): qty 4

## 2020-12-29 MED ORDER — MIDAZOLAM HCL 2 MG/2ML IJ SOLN
INTRAMUSCULAR | Status: AC
  Filled 2020-12-29: qty 2

## 2020-12-29 MED ORDER — HEPARIN (PORCINE) IN NACL 1000-0.9 UT/500ML-% IV SOLN
INTRAVENOUS | Status: AC
  Filled 2020-12-29: qty 1000

## 2020-12-29 MED ORDER — SODIUM CHLORIDE 0.9% FLUSH: 3.0000 mL | INTRAVENOUS | Status: DC | PRN

## 2020-12-29 MED ORDER — ONDANSETRON HCL 4 MG/2ML IJ SOLN: 4.0000 mg | Freq: Four times a day (QID) | INTRAMUSCULAR | Status: DC | PRN

## 2020-12-29 SURGICAL SUPPLY — 11 items
CATH OMNI FLUSH 5F 65CM (CATHETERS) ×1 IMPLANT
FILTER CO2 0.2 MICRON (VASCULAR PRODUCTS) ×1 IMPLANT
KIT MICROPUNCTURE NIT STIFF (SHEATH) ×1 IMPLANT
KIT PV (KITS) ×2 IMPLANT
RESERVOIR CO2 (VASCULAR PRODUCTS) ×1 IMPLANT
SET FLUSH CO2 (MISCELLANEOUS) ×1 IMPLANT
SHEATH PINNACLE 5F 10CM (SHEATH) ×1 IMPLANT
SHEATH PROBE COVER 6X72 (BAG) ×1 IMPLANT
TRANSDUCER W/STOPCOCK (MISCELLANEOUS) ×2 IMPLANT
TRAY PV CATH (CUSTOM PROCEDURE TRAY) ×2 IMPLANT
WIRE BENTSON .035X145CM (WIRE) ×1 IMPLANT

## 2020-12-29 NOTE — Progress Notes (Signed)
[facility]  Subjective  - right leg, pre-tibial wound   Objective (!) 152/116 68 97.7 F (36.5 C) (Oral) 18 100%  Intake/Output Summary (Last 24 hours) at 12/29/2020 1537 Last data filed at 12/29/2020 1300 Gross per 24 hour  Intake 2242.25 ml  Output 925 ml  Net 1317.25 ml    Left femoral pulse No right femoral pulse  Laboratory Lab Results: Recent Labs    12/28/20 0356 12/29/20 0445  WBC 6.7 6.5  HGB 14.8 14.5  HCT 47.8 44.5  PLT 214 236   BMET Recent Labs    12/28/20 0356 12/29/20 0445  NA 137 136  K 4.3 3.8  CL 106 104  CO2 22 23  GLUCOSE 95 99  BUN 18 14  CREATININE 1.50* 1.26*  CALCIUM 9.2 9.1    COAG Lab Results  Component Value Date   INR 1.6 (H) 01/12/2020   INR 1.0 05/30/2019   INR 0.98 11/28/2017   No results found for: PTT  Assessment/Planning:  Plan aortogram, right lower extremity arteriogram for right leg pretibial wound.  Has a functioning kidney transplant as well as a nonfunctioning loop graft in the right groin.  No palpable right femoral pulse as previously noted by Dr. Donnetta Hutching.  Marty Heck 12/29/2020 3:37 PM --

## 2020-12-29 NOTE — Progress Notes (Signed)
Site area: left groin fa sheath pulled by Caren Griffins Site Prior to Removal:  Level 0 Pressure Applied For: 20 minutes Manual:   yes Patient Status During Pull:  stable Post Pull Site:  Level 0 Post Pull Instructions Given:  yes Post Pull Pulses Present: left popliteal pulse dopplered Dressing Applied:  Gauze and tegaderm Bedrest begins @ 1700 Comments:

## 2020-12-29 NOTE — Op Note (Addendum)
    Patient name: Theodore Arellano MRN: [id number] DOB: [birth year] Sex: male  12/29/2020 Pre-operative Diagnosis: Right lower extremity pretibial wound with known peripheral arterial disease Post-operative diagnosis:  Same Surgeon:  Marty Heck, MD Procedure Performed: 1.  Ultrasound-guided access of left common femoral artery 2.  CO2 aortogram 3.  Bilateral lower extremity arteriogram with CO2 4.  20 minutes of monitored moderate conscious sedation time  Indications: 60 year old male who has a complex history including a previous kidney transplant in 2011 as well as previous left common femoral endarterectomy and subsequent left common femoral to PT artery bypass and multiple additional endovascular interventions at [facility].  He was seen in consultation by Dr. Donnetta Hutching earlier this week with right pretibial wound and depressed ABI.  He presents today for formal arteriogram with CO2 after risk benefits discussed/  Findings:   Ultrasound-guided access of left common femoral artery above his left leg bypass.    CO2 aortogram showed patent infra-renal aorta with high-grade stenosis greater than 80% in the proximal right common iliac artery with patent right hypogastric.  The right external iliac as well as the right common femoral is completely occluded.  He does reconstitute right profunda.  Proximal SFA is occluded.  He reconstitutes a very calcified and diseased mid SFA with what appears to be a patent above and below-knee popliteal artery with three-vessel runoff although limited evaluation by CO2  Left leg common femoral artery and common femoral to PT bypass is patent although limited evaluation given CO2.   Procedure:  The patient was identified in the holding area and taken to room 8.  The patient was then placed supine on the table and prepped and draped in the usual sterile fashion.  A time out was called.  Ultrasound was used to evaluate the left common femoral artery.  It was  patent .  A digital ultrasound image was acquired.  A micropuncture needle was used to access the left common femoral artery under ultrasound guidance above his existing bypass.  An 018 wire was advanced without resistance and a micropuncture sheath was placed.  The 018 wire was removed and a benson wire was placed.  The micropuncture sheath was exchanged for a 5 french sheath.  An omniflush catheter was advanced over the wire to the level of L-1.  An abdominal angiogram was obtained.  Next the catheter was pulled down and bilateral iliac shots were obtained.  He then got bilateral lower extremity runoff with CO2 with pertinent findings noted above.  There were no endovascular options.  Wires and catheters were removed.  He remained stable.  Taken to holding to have the sheath removed from the left common femoral artery.   Plan: Will discuss with Dr. Donnetta Hutching next steps given would potentially require complex revascularization for diseased right common iliac, occluded right external iliac, occluded right common femoral, and occluded proximal right SFA.  Complicated by functioning kidney transplant off left iliac, patent left leg bypass, and extensive cardiac history.  Marty Heck, MD [facility] [place] Office: [place]

## 2020-12-29 NOTE — Progress Notes (Signed)
Pt arrived to 4e from cath lab. Pt oriented to room and unit. Vitals obtained. CHG bath completed. Left groin level 0. Bedrest until 9 pm. Pt educated and all questions answered.

## 2020-12-29 NOTE — Progress Notes (Signed)
Pt off the unit upon arrival.

## 2020-12-29 NOTE — Plan of Care (Signed)
  Problem: Clinical Measurements: Goal: Diagnostic test results will improve Outcome: Progressing   Problem: Activity: Goal: Risk for activity intolerance will decrease Outcome: Progressing   
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
## 2020-12-29 NOTE — Consult Note (Signed)
WOC Nurse Consult Note: Patient receiving care in [facility] 5M11.  I have reviewed the chart and photos associated with this hospitalization. Reason for Consult:  LLE wound.  Per the detailed note from Vascular Surgeon Sherren Mocha Early from 12/27/20 at 2158, "SKIN: Left leg is noted for extensive scaling of the skin but no open defects.  Does have a healed lateral fasciotomy wound. On the right leg he has eschar over the pretibial area with mild surrounding erythema" Wound type: extensive scaling of skin on LLE. Dr. Donnetta Hutching is managing the RLE.  If there are questions pertaining to the care of the RLE, direct questions to Dr. Curt Jews. Pressure Injury POA: Yes/No/NA Measurement: Wound bed: Drainage (amount, consistency, odor)  Periwound: Dressing procedure/placement/frequency:  The following nurse care instruction for the LLE has been entered: Wash the LLE with soap and water. Apply light pressure when doing so in order to remove as much of the dry flaking skin as possible.  Apply Sween Moisturizing Ointment (pink and white tube in clean utility) in a heavy layer to LLE. Massage in. [place] nurse will not follow at this time.  Please re-consult the [place] team if needed.  Val Riles, RN, MSN, CWOCN, CNS-BC, pager [phone]

## 2020-12-29 NOTE — Progress Notes (Signed)
ANTICOAGULATION CONSULT NOTE - Follow Up Consult  Pharmacy Consult for IV heparin Indication: limb ischemia  Allergies  Allergen Reactions  . Percocet [Oxycodone-Acetaminophen] Hives and Itching  . Vicodin [Hydrocodone-Acetaminophen] Hives and Itching  . Shellfish Allergy Hives  . Chocolate Flavor Hives  . Chocolate Hives  . Tomato Hives    Patient Measurements: Height: 5\' 7"  (170.2 cm) Weight: 77.8 kg (171 lb 8.3 oz) IBW/kg (Calculated) : 66.1  Vital Signs: Temp: 98.3 F (36.8 C) (03/31 0556) Temp Source: Oral (03/30 1935) BP: 170/110 (03/31 0606) Pulse Rate: 81 (03/31 0556)  Labs: Recent Labs    12/27/20 0539 12/28/20 0356 12/28/20 1314 12/28/20 1914 12/29/20 0445  HGB 14.8 14.8  --   --  14.5  HCT 44.7 47.8  --   --  44.5  PLT 265 214  --   --  236  APTT  --  39* 85*  --   --   HEPARINUNFRC  --   --  0.55 0.43 0.55  CREATININE 1.43* 1.50*  --   --  1.26*    Estimated Creatinine Clearance: 58.3 mL/min (A) (by C-G formula based on SCr of 1.26 mg/dL (H)).   Assessment: 60 y.o. male presenting with RLE pain, determined to be RLE ischemia. PMH includes significant vascular disease with bypass on left leg, PAD on rivaroxaban 2.5 mg. Home rivaroxaban currently on hold (last dose 3/28 at 0800).  Pharmacy consulted for IV heparin.   Daily heparin level therapeutic at 0.55 . Heparin levels from 3/30 correlated with APTT. Hgb stable at 14.5, Plt 236. No signs or symptoms of bleeding reported.   Goal of Therapy:  Heparin level 0.3-0.7 units/ml Monitor platelets by anticoagulation protocol: Yes   Plan:  Continue heparin at 1500 units/hr Monitor daily heparin level, daily CBC, and signs/symptoms of bleed. F/u transition to home rivaroxaban pending vascular evalulation.  Mickeal Skinner, PharmD Student 12/29/2020,6:52 AM

## 2020-12-29 NOTE — Progress Notes (Signed)
ANTICOAGULATION CONSULT NOTE - Follow Up Consult  Pharmacy Consult for IV heparin Indication: limb ischemia  Allergies  Allergen Reactions  . Percocet [Oxycodone-Acetaminophen] Hives and Itching  . Vicodin [Hydrocodone-Acetaminophen] Hives and Itching  . Shellfish Allergy Hives  . Chocolate Flavor Hives  . Chocolate Hives  . Tomato Hives    Patient Measurements: Height: 5\' 7"  (170.2 cm) Weight: 77.8 kg (171 lb 8.3 oz) IBW/kg (Calculated) : 66.1  Vital Signs: Temp: 97.7 F (36.5 C) (03/31 0938) Temp Source: Oral (03/31 0938) BP: 194/120 (03/31 1607) Pulse Rate: 88 (03/31 1607)  Labs: Recent Labs    12/27/20 0539 12/28/20 0356 12/28/20 1314 12/28/20 1914 12/29/20 0445  HGB 14.8 14.8  --   --  14.5  HCT 44.7 47.8  --   --  44.5  PLT 265 214  --   --  236  APTT  --  39* 85*  --   --   HEPARINUNFRC  --   --  0.55 0.43 0.55  CREATININE 1.43* 1.50*  --   --  1.26*    Estimated Creatinine Clearance: 58.3 mL/min (A) (by C-G formula based on SCr of 1.26 mg/dL (H)).   Assessment: 60 y.o. male presenting with RLE pain, determined to be RLE ischemia. PMH includes significant vascular disease with bypass on left leg, PAD on rivaroxaban 2.5 mg. Home rivaroxaban currently on hold (last dose 3/28 at 0800).    Patient s/p aortogram. Pharmacy consulted to resume heparin 8 hrs after sheath removal. Sheath removed at ~1615. Patient previously therapeutic on heparin 1500 units/hr. Monitoring by heparin level now that aPTT and heparin level correlating.    Goal of Therapy:  Heparin level 0.3-0.7 units/ml Monitor platelets by anticoagulation protocol: Yes   Plan:  Resume heparin at 1500 units/hr at 0030 on 4/1 Check 6h heparin level Monitor daily heparin level, daily CBC, and signs/symptoms of bleed. F/u transition to home rivaroxaban pending vascular evalulation.  Cristela Felt, PharmD Clinical Pharmacist  12/29/2020,4:19 PM

## 2020-12-29 NOTE — TOC Progression Note (Signed)
Transition of Care [facility]) - Progression Note    Patient Details  Name: Theodore Arellano MRN: [id number] Date of Birth: May 26, 1961  [facility] CM/SW Contact  Sharlet Salina Mila Homer, LCSW Phone Number: 12/29/2020, 3:26 PM  Clinical Narrative:  [place] and patient had no bed offers this morning. Calls made to SNF's that did not respond: [facility], [facility], English as a foreign language. After reviewing patient's information, [name], admissions director at [facility] extended a bed offer. Visited with patient and informed him of bed offer, and he expressed agreement in going to this facility.  Contacted admissions director [name] at [facility] and they can accept patient once medically stable and pending insurance auth.   Call made to [employer] to initiate authorization - reference [id number] and fax number for clinicals - [phone].    Expected Discharge Plan: [place] Barriers to Discharge: Continued Medical Work up  Expected Discharge Plan and Services Expected Discharge Plan: [place] In-house Referral: Clinical Social Work     Living arrangements for the past 2 months: Single Family Home                                     Social Determinants of Health (SDOH) Interventions  No SDOH interventions requested or needed at this time  Readmission Risk Interventions No flowsheet data found.

## 2020-12-30 ENCOUNTER — Encounter (HOSPITAL_COMMUNITY): Payer: Self-pay | Admitting: Vascular Surgery

## 2020-12-30 ENCOUNTER — Inpatient Hospital Stay (HOSPITAL_COMMUNITY): Payer: Medicare Other

## 2020-12-30 DIAGNOSIS — Z01810 Encounter for preprocedural cardiovascular examination: Secondary | ICD-10-CM

## 2020-12-30 DIAGNOSIS — S81801A Unspecified open wound, right lower leg, initial encounter: Secondary | ICD-10-CM | POA: Diagnosis not present

## 2020-12-30 LAB — MAGNESIUM: Magnesium: 1.8 mg/dL (ref 1.7–2.4)

## 2020-12-30 LAB — CBC
HCT: 44.3 % (ref 39.0–52.0)
Hemoglobin: 14.6 g/dL (ref 13.0–17.0)
MCH: 28 pg (ref 26.0–34.0)
MCHC: 33 g/dL (ref 30.0–36.0)
MCV: 85 fL (ref 80.0–100.0)
Platelets: 237 10*3/uL (ref 150–400)
RBC: 5.21 MIL/uL (ref 4.22–5.81)
RDW: 14.9 % (ref 11.5–15.5)
WBC: 5.9 10*3/uL (ref 4.0–10.5)
nRBC: 0 % (ref 0.0–0.2)

## 2020-12-30 LAB — HEPARIN LEVEL (UNFRACTIONATED)
Heparin Unfractionated: 0.27 IU/mL — ABNORMAL LOW (ref 0.30–0.70)
Heparin Unfractionated: 0.34 IU/mL (ref 0.30–0.70)

## 2020-12-30 LAB — RENAL FUNCTION PANEL
Albumin: 2.9 g/dL — ABNORMAL LOW (ref 3.5–5.0)
Anion gap: 10 (ref 5–15)
BUN: 19 mg/dL (ref 6–20)
CO2: 22 mmol/L (ref 22–32)
Calcium: 9.5 mg/dL (ref 8.9–10.3)
Chloride: 104 mmol/L (ref 98–111)
Creatinine, Ser: 1.52 mg/dL — ABNORMAL HIGH (ref 0.61–1.24)
GFR, Estimated: 52 mL/min — ABNORMAL LOW (ref >60)
Glucose, Bld: 111 mg/dL — ABNORMAL HIGH (ref 70–99)
Phosphorus: 4.1 mg/dL (ref 2.5–4.6)
Potassium: 4.1 mmol/L (ref 3.5–5.1)
Sodium: 136 mmol/L (ref 135–145)

## 2020-12-30 MED ORDER — HYDROMORPHONE HCL 2 MG PO TABS
4.0000 mg | ORAL_TABLET | Freq: Three times a day (TID) | ORAL | Status: DC | PRN
  Administered 2020-12-30 – 2021-01-02 (×6): 4 mg via ORAL
  Administered 2021-01-03 (×2): 2 mg via ORAL
  Administered 2021-01-04 (×3): 4 mg via ORAL
  Filled 2020-12-30 (×11): qty 2

## 2020-12-30 MED ORDER — CARVEDILOL 3.125 MG PO TABS
3.1250 mg | ORAL_TABLET | Freq: Two times a day (BID) | ORAL | Status: DC
  Administered 2020-12-31 – 2021-01-07 (×14): 3.125 mg via ORAL
  Filled 2020-12-30 (×16): qty 1

## 2020-12-30 NOTE — Plan of Care (Signed)

## 2020-12-30 NOTE — TOC Progression Note (Addendum)
Transition of Care [facility]) - Progression Note    Patient Details  Name: Theodore Arellano MRN: [id number] Date of Birth: 1960-10-30  Transition of Care [facility]) CM/SW Contact  Sharlet Salina Mila Homer, LCSW Phone Number: 12/30/2020, 4:14 PM  Clinical Narrative:  CSW received call from [facility] with [employer]. Patient approved for ST rehab eff. 4/1 for 5 days. Reference number [id number]. Next review date 4/5 and case manager is Darnelle Bos.  Fax number for continued care clinicals: [phone]. Call made to [facility] at [facility] and she had also been contacted by [employer] with auth information. Per [name], they will not be able to admit patient over the weekend.    Expected Discharge Plan: [place] Barriers to Discharge: Continued Medical Work up  Expected Discharge Plan and Services Expected Discharge Plan: [place] In-house Referral: Clinical Social Work     Living arrangements for the past 2 months: Single Family Home                                     Social Determinants of Health (SDOH) Interventions  No SDOH interventions requested or needed at this time.  Readmission Risk Interventions No flowsheet data found.

## 2020-12-30 NOTE — Progress Notes (Signed)
ANTICOAGULATION CONSULT NOTE - Follow Up Consult  Pharmacy Consult for IV heparin Indication: limb ischemia  Allergies  Allergen Reactions  . Percocet [Oxycodone-Acetaminophen] Hives and Itching  . Vicodin [Hydrocodone-Acetaminophen] Hives and Itching  . Shellfish Allergy Hives  . Chocolate Flavor Hives  . Chocolate Hives  . Tomato Hives    Patient Measurements: Height: 5\' 7"  (170.2 cm) Weight: 77.8 kg (171 lb 8.3 oz) IBW/kg (Calculated) : 66.1  Vital Signs: Temp: 98 F (36.7 C) (04/01 1625) Temp Source: Oral (04/01 1625) BP: 168/107 (04/01 1842) Pulse Rate: 80 (04/01 1625)  Labs: Recent Labs    12/28/20 0356 12/28/20 1314 12/28/20 1914 12/29/20 0445 12/30/20 0204 12/30/20 0735 12/30/20 1757  HGB 14.8  --   --  14.5 14.6  --   --   HCT 47.8  --   --  44.5 44.3  --   --   PLT 214  --   --  236 237  --   --   APTT 39* 85*  --   --   --   --   --   HEPARINUNFRC  --  0.55   < > 0.55  --  0.27* 0.34  CREATININE 1.50*  --   --  1.26* 1.52*  --   --    < > = values in this interval not displayed.    Estimated Creatinine Clearance: 48.3 mL/min (A) (by C-G formula based on SCr of 1.52 mg/dL (H)).   Assessment: 60 y.o. male presenting with RLE pain, determined to be RLE ischemia. PMH includes significant vascular disease with bypass on left leg, PAD on rivaroxaban 2.5 mg. Home rivaroxaban currently on hold (last dose 3/28 at 0800).    Patient s/p sheath removal on 4/1. Heparin level therapeutic this PM  Goal of Therapy:  Heparin level 0.3-0.7 units/ml Monitor platelets by anticoagulation protocol: Yes   Plan:  Continue heparin at 1600 units / hr Follow up AM labs Monitor CBC, and signs/symptoms of bleed. F/u transition to home rivaroxaban pending vascular evalulation.  Thank you Anette Guarneri, PharmD  12/30/2020,6:55 PM
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
## 2020-12-30 NOTE — Care Management Important Message (Signed)
Important Message  Patient Details  Name: Theodore Arellano MRN: [id number] Date of Birth: April 04, 1961   Medicare Important Message Given:  Yes     Shelda Altes 12/30/2020, 10:22 AM
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
## 2020-12-30 NOTE — Progress Notes (Signed)
ANTICOAGULATION CONSULT NOTE - Follow Up Consult  Pharmacy Consult for IV heparin Indication: limb ischemia  Allergies  Allergen Reactions  . Percocet [Oxycodone-Acetaminophen] Hives and Itching  . Vicodin [Hydrocodone-Acetaminophen] Hives and Itching  . Shellfish Allergy Hives  . Chocolate Flavor Hives  . Chocolate Hives  . Tomato Hives    Patient Measurements: Height: 5\' 7"  (170.2 cm) Weight: 77.8 kg (171 lb 8.3 oz) IBW/kg (Calculated) : 66.1  Vital Signs: Temp: 98.2 F (36.8 C) (04/01 0725) Temp Source: Oral (04/01 0725) BP: 133/78 (04/01 0725) Pulse Rate: 82 (04/01 0725)  Labs: Recent Labs    12/28/20 0356 12/28/20 1314 12/28/20 1314 12/28/20 1914 12/29/20 0445 12/30/20 0204 12/30/20 0735  HGB 14.8  --   --   --  14.5 14.6  --   HCT 47.8  --   --   --  44.5 44.3  --   PLT 214  --   --   --  236 237  --   APTT 39* 85*  --   --   --   --   --   HEPARINUNFRC  --  0.55   < > 0.43 0.55  --  0.27*  CREATININE 1.50*  --   --   --  1.26* 1.52*  --    < > = values in this interval not displayed.    Estimated Creatinine Clearance: 48.3 mL/min (A) (by C-G formula based on SCr of 1.52 mg/dL (H)).   Assessment: 60 y.o. male presenting with RLE pain, determined to be RLE ischemia. PMH includes significant vascular disease with bypass on left leg, PAD on rivaroxaban 2.5 mg. Home rivaroxaban currently on hold (last dose 3/28 at 0800).    Patient s/p sheath removal on 4/1. Pharmacy consulted to resume heparin 8 hrs after procedure. Patient previously therapeutic on heparin 1500 units/hr, restarted 4/1 at 0054. HL obtained 0735 and subtherapeutic at 0.27. No bleeding or issues with infusion per RN. Hgb 14.6 (stable), Plts 237 (stable). Approaching dose adjustments conservatively considering recent procedure.   Goal of Therapy:  Heparin level 0.3-0.7 units/ml Monitor platelets by anticoagulation protocol: Yes   Plan:  Increase heparin to 1600 units/hr Check 8h heparin  level Monitor CBC, and signs/symptoms of bleed. F/u transition to home rivaroxaban pending vascular evalulation.  Benna Dunks  PharmD Candidate, Class of 2022  12/30/2020,8:36 AM

## 2020-12-30 NOTE — CV Procedure (Signed)
Vein mapping completed.  Results can be found under chart review under CV PROC. 12/30/2020 5:25 PM Cataldo Cosgriff RVT, RDMS

## 2020-12-30 NOTE — Progress Notes (Signed)
Pt having changes on EKG monitor. Pt asymptomatic. MD paged.

## 2020-12-30 NOTE — Progress Notes (Signed)
FPTS Interim Progress Note  S: Went to see patient at bedside.  No concerns at this time.  O: BP (!) 182/93 (BP Location: Left Arm)   Pulse 85   Temp 98.1 F (36.7 C) (Oral)   Resp 20   Ht 5\' 7"  (1.702 m)   Wt 77.8 kg   SpO2 100%   BMI 26.86 kg/m   General: Resting in bed comfortably watching television, NAD CV: RRR, HR in the 80s on monitors  A/P: HTN BP persistently elevated throughout the day, most recently 182/93.  He received a as needed dose of hydralazine 5 mg earlier today at 1846, which is no longer ordered.  Suspect elevated BP may be secondary to pain.  We will continue to monitor BP for now, suspect he will improve with sleep and pain control.  Consider starting amlodipine in the morning.  Bradycardia Normal rate at this time, will continue to monitor.  Zola Button, MD 12/30/2020, 9:42 PM PGY-1, [place] Medicine Service pager [phone]

## 2020-12-30 NOTE — Progress Notes (Addendum)
Progress Note    12/30/2020 8:01 AM 1 Day Post-Op  Subjective:  No complaints   Vitals:   12/30/20 0500 12/30/20 0725  BP: 129/85 133/78  Pulse: 70 82  Resp: 15 16  Temp: 98.5 F (36.9 C) 98.2 F (36.8 C)  SpO2: 98% 90%   Physical Exam: Lungs:  Non labored Incisions:  L groin soft Extremities:  R pretibial wound Abdomen:  soft  CBC    Component Value Date/Time   WBC 5.9 12/30/2020 0204   RBC 5.21 12/30/2020 0204   HGB 14.6 12/30/2020 0204   HCT 44.3 12/30/2020 0204   PLT 237 12/30/2020 0204   MCV 85.0 12/30/2020 0204   MCH 28.0 12/30/2020 0204   MCHC 33.0 12/30/2020 0204   RDW 14.9 12/30/2020 0204   LYMPHSABS 1.4 12/27/2020 0539   MONOABS 0.6 12/27/2020 0539   EOSABS 0.1 12/27/2020 0539   BASOSABS 0.0 12/27/2020 0539    BMET    Component Value Date/Time   NA 136 12/30/2020 0204   K 4.1 12/30/2020 0204   CL 104 12/30/2020 0204   CO2 22 12/30/2020 0204   GLUCOSE 111 (H) 12/30/2020 0204   BUN 19 12/30/2020 0204   CREATININE 1.52 (H) 12/30/2020 0204   CALCIUM 9.5 12/30/2020 0204   GFRNONAA 52 (L) 12/30/2020 0204   GFRAA >60 01/13/2020 0248    INR    Component Value Date/Time   INR 1.6 (H) 01/12/2020 0656     Intake/Output Summary (Last 24 hours) at 12/30/2020 0801 Last data filed at 12/30/2020 0549 Gross per 24 hour  Intake 430.49 ml  Output 375 ml  Net 55.49 ml     Assessment/Plan:  60 y.o. male is s/p diagnostic arteriogram for RLE wound 1 Day Post-Op   -L groin cath site soft -Arteriogram demonstrating multi-level disease of RLE including diseased right common iliac, occluded right external iliac, occluded right common femoral, and occluded proximal right SFA -Dr. Carlis Abbott and Dr. Donnetta Hutching will discuss plan for revascularization with patient later today   Dagoberto Ligas, PA-C [facility] [phone] 12/30/2020 8:01 AM  I have examined the patient, reviewed and agree with above.  Discussed arteriogram with the patient at  length.  Very difficult management situation.  Complete occlusion of iliac inflow into his right groin with profunda branches only as outflow.  Diseased superficial femoral artery when it reconstitutes.  Limited options regarding inflow to the right groin.  Do not feel he is a candidate for aortobifemoral bypass grafting.  Would be concerned regarding a left to right femorofemoral bypass and due to his left femoral to posterior tibial bypass and recent extensive treatments for limb salvage on the left.  He does have a bounding pulse at his right brachial artery.  No right radial pulse.  He has a patent access in his right brachial artery.  I discussed option of right axillary to femoral bypass and possible femoral to popliteal bypass.  He does not have any evidence of bony involvement on his MRI but does have extensive wound over his pretibial area.  I do not feel that he has adequate flow for healing of this.  He understands this is a attempt at limb salvage to allow healing of this wound.  Will obtain vein mapping.  In all likelihood has no vein available due to his coronary bypass grafting.  We will plan right axillary to femoral bypass on Tuesday, 01/03/2021 with possible femoropopliteal bypass as well  Curt Jews, MD 12/30/2020 12:37 PM

## 2020-12-30 NOTE — Progress Notes (Signed)
Received page from RN stating telemetry reported bradycardia as low as 36. Reported to bedside. Patient noted to be asymptomatic, denying dizziness, nausea, lightheadedness. Stated that he was sleeping when nursing staff woke him for assessment.  Contacted cardiology MD on call, Dr. Gasper Sells, who reviewed telemetry reported patient was in mobitz type I, recommended decreasing coreg to 3.125mg  from 6.25 BID. Recommended monitoring rhythm and counseled that decrease in HR likely related to sleeping. Recommended to call cardiology if any cardiac related questions over the weekend.   Eulis Foster, MD  [facility] Service, PGY-2  [place] Intern Pager [phone]

## 2020-12-31 DIAGNOSIS — S81801A Unspecified open wound, right lower leg, initial encounter: Secondary | ICD-10-CM | POA: Diagnosis not present

## 2020-12-31 DIAGNOSIS — I70232 Atherosclerosis of native arteries of right leg with ulceration of calf: Secondary | ICD-10-CM | POA: Diagnosis not present

## 2020-12-31 LAB — CBC
HCT: 42.9 % (ref 39.0–52.0)
Hemoglobin: 14.2 g/dL (ref 13.0–17.0)
MCH: 27.8 pg (ref 26.0–34.0)
MCHC: 33.1 g/dL (ref 30.0–36.0)
MCV: 84.1 fL (ref 80.0–100.0)
Platelets: 229 10*3/uL (ref 150–400)
RBC: 5.1 MIL/uL (ref 4.22–5.81)
RDW: 14.7 % (ref 11.5–15.5)
WBC: 7.1 10*3/uL (ref 4.0–10.5)
nRBC: 0 % (ref 0.0–0.2)

## 2020-12-31 LAB — RENAL FUNCTION PANEL
Albumin: 2.8 g/dL — ABNORMAL LOW (ref 3.5–5.0)
Anion gap: 10 (ref 5–15)
BUN: 22 mg/dL — ABNORMAL HIGH (ref 6–20)
CO2: 22 mmol/L (ref 22–32)
Calcium: 9.3 mg/dL (ref 8.9–10.3)
Chloride: 102 mmol/L (ref 98–111)
Creatinine, Ser: 1.51 mg/dL — ABNORMAL HIGH (ref 0.61–1.24)
GFR, Estimated: 53 mL/min — ABNORMAL LOW (ref >60)
Glucose, Bld: 107 mg/dL — ABNORMAL HIGH (ref 70–99)
Phosphorus: 4.3 mg/dL (ref 2.5–4.6)
Potassium: 3.8 mmol/L (ref 3.5–5.1)
Sodium: 134 mmol/L — ABNORMAL LOW (ref 135–145)

## 2020-12-31 LAB — HEPARIN LEVEL (UNFRACTIONATED): Heparin Unfractionated: 0.31 IU/mL (ref 0.30–0.70)

## 2020-12-31 MED ORDER — AMLODIPINE BESYLATE 5 MG PO TABS
5.0000 mg | ORAL_TABLET | Freq: Every day | ORAL | Status: DC
  Administered 2020-12-31 – 2021-01-04 (×5): 5 mg via ORAL
  Filled 2020-12-31 (×5): qty 1

## 2020-12-31 NOTE — Progress Notes (Signed)
ANTICOAGULATION CONSULT NOTE - Follow Up Consult  Pharmacy Consult for IV heparin Indication: limb ischemia  Allergies  Allergen Reactions  . Percocet [Oxycodone-Acetaminophen] Hives and Itching  . Vicodin [Hydrocodone-Acetaminophen] Hives and Itching  . Shellfish Allergy Hives  . Chocolate Flavor Hives  . Chocolate Hives  . Tomato Hives    Patient Measurements: Height: 5\' 7"  (170.2 cm) Weight: 70.9 kg (156 lb 4.9 oz) IBW/kg (Calculated) : 66.1  Vital Signs: Temp: 98 F (36.7 C) (04/02 0551) Temp Source: Oral (04/02 0551) BP: 168/96 (04/02 0551) Pulse Rate: 76 (04/02 0551)  Labs: Recent Labs    12/28/20 1314 12/28/20 1914 12/29/20 0445 12/30/20 0204 12/30/20 0735 12/30/20 1757 12/31/20 0129  HGB  --    < > 14.5 14.6  --   --  14.2  HCT  --   --  44.5 44.3  --   --  42.9  PLT  --   --  236 237  --   --  229  APTT 85*  --   --   --   --   --   --   HEPARINUNFRC 0.55   < > 0.55  --  0.27* 0.34 0.31  CREATININE  --   --  1.26* 1.52*  --   --  1.51*   < > = values in this interval not displayed.    Estimated Creatinine Clearance: 48.6 mL/min (A) (by C-G formula based on SCr of 1.51 mg/dL (H)).   Assessment: 60 y.o. male presenting with RLE pain, determined to be RLE ischemia. PMH includes significant vascular disease with bypass on left leg, PAD on rivaroxaban 2.5 mg. Home rivaroxaban currently on hold (last dose 3/28 at 0800).    Patient s/p sheath removal on 4/1. Heparin level therapeutic. Hgb 14.2, PLT 229 are stable. No signs of overt bleeding noted.   Goal of Therapy:  Heparin level 0.3-0.7 units/ml Monitor platelets by anticoagulation protocol: Yes   Plan:  Continue heparin at 1600 units / hr Daily heparin level and CBC Monitor signs/symptoms of bleed.  F/u transition to home rivaroxaban pending vascular evalulation.  Norina Buzzard, PharmD PGY1 Pharmacy Resident 12/31/2020 7:20 AM
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
## 2020-12-31 NOTE — Progress Notes (Signed)
S) Mr. Storlie was assessed at bedside shortly following signout.  He denies any trouble with shortness of breath, chest pain, palpitations throughout the day.  He is aware that he had a low heart rate last night and we have decreased his beta-blocker as result.  He has no new concerns or issues at this time.  His pain is currently well controlled on his home regimen.  O) BP (!) 155/98 (BP Location: Left Arm)   Pulse 85   Temp 98.2 F (36.8 C) (Oral)   Resp 16   Ht 5\' 7"  (1.702 m)   Wt 70.9 kg   SpO2 100%   BMI 24.48 kg/m   General: Alert and cooperative and appears to be in no acute distress.  Resting in bed comfortably, watching basketball, no acute distress. Cardio: Normal S1 and S2, no S3 or S4. Rhythm is regular. No murmurs or rubs.   Pulm: Clear to auscultation bilaterally, no crackles, wheezing, or diminished breath sounds. Normal respiratory effort Abdomen: Abdomen soft and non-tender.  Extremities: No peripheral edema.  Poorly healing wound over right lower extremity.  No active bleeding. Neuro: Cranial nerves grossly intact   A/P) Mr. Schurman is a 60 year old gentleman who was hospitalized for significant vascular disease awaiting revascularization procedures.  He did have a notable episode of bradycardia last night but has been well since then.  No evidence of any cardiac distress at this time.  We will continue to monitor.
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
## 2020-12-31 NOTE — Progress Notes (Addendum)
FMTS Attending Daily Note: Yehuda Savannah, MD  Team Pager [phone] Pager [phone] I have seen and examined this patient, reviewed their chart. I have discussed this patient with the resident. I agree with the resident's findings, assessment and care plan.  Mr Levit has no concerns this morning, pain well controlled on PO meds. Today is the last day of his Augmentin. Denies chest pain, palpitations, or dyspnea. Amlodipine 5mg  started, consider increasing to 10mg  if BP remain elevated.  Edits made in note below.       Family Medicine Teaching Service Daily Progress Note Intern Pager: [phone]  Patient name: MAHKAI FANGMAN Medical record number: [id number] Date of birth: 04/11/1961 Age: 60 y.o. Gender: male  Primary Care Provider: [name], MD Consultants: Vascular surgery Code Status: Full  Pt Overview and Major Events to Date:  3/29: Admitted   Assessment and Plan: Jahmal Dunavant Brownis a 60 y.o.malewho presentedwith worsening RLE painconcerning for limb ischemia. PMH is significant forrenal transplant2011, CAD, type II DM, HLD, COVID-19 infection, cocaineuse,ESRD chronic left lower extremity wound followed by [facility].  Peripheral vascular disease with multiple areas of stenosis Right common iliac artery: 80% stenosed. right external iliac artery: occluded.  Right common femoral artery: occluded.  Superficial femoral artery: occluded.  No changes in his physical exam at this time.  No concern for acute limb ischemia currently.  Plan for right axillary femoral bypass on 4/5 per vascular surgery. - Vascular surgery following, appreciate continued involvement and recommendations  - Scheduled Tylenol 650mg  q6h - On home PO dilaudid pain regimen  Right lower extremity wound, poorly healing Likely to the above-noted peripheral vascular disease.  Well-appearing at this time without evidence of gross purulence or erythema. -S/pVanc and  flagyl(3/29-3/30) -ContinueAugmentin (3/30-), plan to continue until 4/3  PAD  CAD s/p CABG Chronic left lower extremity wound s/p CABG -Continue ASA 81 mg daily -Heparin gtt -Rosuvastatin 20 mg daily -Wound care consult  Type 2 DM  Hgb A1c 6.4. Not on home medications. -Holding CBG checks/SSI for now  HTN  Bradycardia  Mobitz Type I Heart rate down to 45 overnight.  No trouble with shortness of breath, chest pain, palpitations. -Monitor BP -carvedilol 3.125 mg BID - amlodipine 5 mg daily  Neuropathy  -Continue Gabapentin 200 mg TID  HLD: chronic, stable Lipid panel WNL with Triglycerides 122, LDL 60. -Continue Rosuvastatin 20 mg daily  GERD: chronic, stable Home medication: Protonix. On chronic steroids, will continue for GI ppx.  -Continue protonix 40 mg daily.   ESRDnot on HD s/p DDKT  -F/u Tacrolimus -Continue Tacrolimus 3mg  in AM, 2mg  in PM  -Continue mycophenolate 720 mg BID -Continue daily prednisone.  -Continuehome sodium bicarb  Gout: chronic, stable. -Hold colchicine  Cocaine Use  Tobacco Use Alcohol Misuse UDS positive for cocaine. -Encourage cessation -Nicotine patch PRN  FEN/GI: heart healthy PPx: heparin ggt   Status is: Inpatient  Remains inpatient appropriate because:Inpatient level of care appropriate due to severity of illness   Dispo:  Patient From: Home  Planned Disposition: [place]  Medically stable for discharge: No     Subjective:  No acute events overnight.  No evidence of shortness of breath, chest pain, palpitations.  Has no new concerns this morning.  Pain is well controlled with his current pain regimen.  Objective: Temp:  [98 F (36.7 C)-98.3 F (36.8 C)] 98.3 F (36.8 C) (04/02 2125) Pulse Rate:  [62-85] 72 (04/02 2125) Resp:  [13-25] 25 (04/02 2125) BP: (155-174)/(93-110) 163/93 (  04/02 2125) SpO2:  [96 %-100 %] 100 % (04/02 2125) Weight:  [70.9 kg] 70.9 kg (04/02  0550) Physical Exam: General: Alert and cooperative and appears to be in no acute distress.  Resting in bed comfortably.  Asleep when I entered the room. Cardio: Normal S1 and S2, no S3 or S4. Rhythm is regular. No murmurs or rubs.   Pulm: Clear to auscultation bilaterally, no crackles, wheezing, or diminished breath sounds. Normal respiratory effort. Abdomen: Bowel sounds normal. Abdomen soft and non-tender.  Extremities: No peripheral edema.  Right lower extremity leg wound without evidence of purulence or surrounding erythema. Neuro: Cranial nerves grossly intact   Laboratory: Recent Labs  Lab 12/29/20 0445 12/30/20 0204 12/31/20 0129  WBC 6.5 5.9 7.1  HGB 14.5 14.6 14.2  HCT 44.5 44.3 42.9  PLT 236 237 229   Recent Labs  Lab 12/27/20 0539 12/28/20 0356 12/29/20 0445 12/30/20 0204 12/31/20 0129  NA 136   < > 136 136 134*  K 4.0   < > 3.8 4.1 3.8  CL 104   < > 104 104 102  CO2 24   < > 23 22 22   BUN 17   < > 14 19 22*  CREATININE 1.43*   < > 1.26* 1.52* 1.51*  CALCIUM 9.3   < > 9.1 9.5 9.3  PROT 7.8  --   --   --   --   BILITOT 0.8  --   --   --   --   ALKPHOS 76  --   --   --   --   ALT 18  --   --   --   --   AST 26  --   --   --   --   GLUCOSE 100*   < > 99 111* 107*   < > = values in this interval not displayed.    Imaging/Diagnostic Tests: No results found.  Matilde Haymaker, MD 12/31/2020, 9:31 PM PGY-3, [place] Intern pager: [phone], text pages welcome

## 2020-12-31 NOTE — Progress Notes (Signed)
   12/31/20 1335  Vitals  BP (!) 174/103  MAP (mmHg) 124  ECG Heart Rate 89  Resp (!) 27  Level of Consciousness  Level of Consciousness Alert    Norvasc given

## 2020-12-31 NOTE — Progress Notes (Addendum)
  Progress Note    12/31/2020 9:23 AM 2 Days Post-Op  Subjective:  No complaints   Vitals:   12/31/20 0551 12/31/20 0902  BP: (!) 168/96 (!) 157/96  Pulse: 76 62  Resp: 20 20  Temp: 98 F (36.7 C) 98.2 F (36.8 C)  SpO2: 96% 96%   Physical Exam: Lungs:  Non labored Incisions:  L groin cath site soft Extremities:  R pretibial wound stable Neurologic: A&O  CBC    Component Value Date/Time   WBC 7.1 12/31/2020 0129   RBC 5.10 12/31/2020 0129   HGB 14.2 12/31/2020 0129   HCT 42.9 12/31/2020 0129   PLT 229 12/31/2020 0129   MCV 84.1 12/31/2020 0129   MCH 27.8 12/31/2020 0129   MCHC 33.1 12/31/2020 0129   RDW 14.7 12/31/2020 0129   LYMPHSABS 1.4 12/27/2020 0539   MONOABS 0.6 12/27/2020 0539   EOSABS 0.1 12/27/2020 0539   BASOSABS 0.0 12/27/2020 0539    BMET    Component Value Date/Time   NA 134 (L) 12/31/2020 0129   K 3.8 12/31/2020 0129   CL 102 12/31/2020 0129   CO2 22 12/31/2020 0129   GLUCOSE 107 (H) 12/31/2020 0129   BUN 22 (H) 12/31/2020 0129   CREATININE 1.51 (H) 12/31/2020 0129   CALCIUM 9.3 12/31/2020 0129   GFRNONAA 53 (L) 12/31/2020 0129   GFRAA >60 01/13/2020 0248    INR    Component Value Date/Time   INR 1.6 (H) 01/12/2020 0656     Intake/Output Summary (Last 24 hours) at 12/31/2020 1594 Last data filed at 12/31/2020 0402 Gross per 24 hour  Intake 819.04 ml  Output --  Net 819.04 ml     Assessment/Plan:  60 y.o. male is s/p RLE arteriogram for wound 2 Days Post-Op   L groin cath site soft without hematoma Vein mapping with inadequate R GSV; portion of B GSV harvested for CABG Plan is for R ax-fem and possible fem pop Tuesday 4/5   Dagoberto Ligas, PA-C [facility] [phone] 12/31/2020 9:23 AM   I have seen and evaluated the patient. I agree with the PA note as documented above.  60 year old male with significant right iliac disease, common femoral disease, and SFA disease with right pre-tibial wound.   Complex situation given functioning kidney transplant with left femoral to PT bypass.  Ultimately he is on the schedule for Tuesday for right axillofemoral and possible femoropopliteal bypass with Dr. Donnetta Hutching.  Updated him on plan of care.  Marty Heck, MD [facility] [place] Office: [phone]

## 2021-01-01 DIAGNOSIS — Z7901 Long term (current) use of anticoagulants: Secondary | ICD-10-CM

## 2021-01-01 DIAGNOSIS — I2583 Coronary atherosclerosis due to lipid rich plaque: Secondary | ICD-10-CM

## 2021-01-01 DIAGNOSIS — I2510 Atherosclerotic heart disease of native coronary artery without angina pectoris: Secondary | ICD-10-CM | POA: Diagnosis not present

## 2021-01-01 DIAGNOSIS — L97909 Non-pressure chronic ulcer of unspecified part of unspecified lower leg with unspecified severity: Secondary | ICD-10-CM

## 2021-01-01 DIAGNOSIS — L03115 Cellulitis of right lower limb: Secondary | ICD-10-CM | POA: Diagnosis not present

## 2021-01-01 LAB — RENAL FUNCTION PANEL
Albumin: 2.7 g/dL — ABNORMAL LOW (ref 3.5–5.0)
Anion gap: 6 (ref 5–15)
BUN: 21 mg/dL — ABNORMAL HIGH (ref 6–20)
CO2: 24 mmol/L (ref 22–32)
Calcium: 9.2 mg/dL (ref 8.9–10.3)
Chloride: 105 mmol/L (ref 98–111)
Creatinine, Ser: 1.33 mg/dL — ABNORMAL HIGH (ref 0.61–1.24)
GFR, Estimated: >60 mL/min (ref >60)
Glucose, Bld: 149 mg/dL — ABNORMAL HIGH (ref 70–99)
Phosphorus: 3.9 mg/dL (ref 2.5–4.6)
Potassium: 3.9 mmol/L (ref 3.5–5.1)
Sodium: 135 mmol/L (ref 135–145)

## 2021-01-01 LAB — CBC
HCT: 41.5 % (ref 39.0–52.0)
Hemoglobin: 13.7 g/dL (ref 13.0–17.0)
MCH: 28.6 pg (ref 26.0–34.0)
MCHC: 33 g/dL (ref 30.0–36.0)
MCV: 86.6 fL (ref 80.0–100.0)
Platelets: 220 10*3/uL (ref 150–400)
RBC: 4.79 MIL/uL (ref 4.22–5.81)
RDW: 15 % (ref 11.5–15.5)
WBC: 6.2 10*3/uL (ref 4.0–10.5)
nRBC: 0 % (ref 0.0–0.2)

## 2021-01-01 LAB — CULTURE, BLOOD (ROUTINE X 2)
Culture: NO GROWTH
Culture: NO GROWTH
Special Requests: ADEQUATE

## 2021-01-01 LAB — HEPARIN LEVEL (UNFRACTIONATED): Heparin Unfractionated: 0.34 IU/mL (ref 0.30–0.70)

## 2021-01-01 MED ORDER — HYDROXYZINE HCL 25 MG PO TABS
25.0000 mg | ORAL_TABLET | Freq: Three times a day (TID) | ORAL | Status: DC | PRN
  Administered 2021-01-01 – 2021-01-02 (×2): 25 mg via ORAL
  Filled 2021-01-01 (×2): qty 1

## 2021-01-01 NOTE — Progress Notes (Signed)
ANTICOAGULATION CONSULT NOTE - Follow Up Consult  Pharmacy Consult for IV heparin Indication: limb ischemia  Allergies  Allergen Reactions  . Percocet [Oxycodone-Acetaminophen] Hives and Itching  . Vicodin [Hydrocodone-Acetaminophen] Hives and Itching  . Shellfish Allergy Hives  . Chocolate Flavor Hives  . Chocolate Hives  . Tomato Hives    Patient Measurements: Height: 5\' 7"  (170.2 cm) Weight: 70.9 kg (156 lb 4.9 oz) IBW/kg (Calculated) : 66.1  Vital Signs: Temp: 98.4 F (36.9 C) (04/03 0721) Temp Source: Oral (04/03 0721) BP: 150/86 (04/03 0721) Pulse Rate: 68 (04/03 0721)  Labs: Recent Labs    12/30/20 0204 12/30/20 0735 12/30/20 1757 12/31/20 0129 01/01/21 0146  HGB 14.6  --   --  14.2 13.7  HCT 44.3  --   --  42.9 41.5  PLT 237  --   --  229 220  HEPARINUNFRC  --    < > 0.34 0.31 0.34  CREATININE 1.52*  --   --  1.51* 1.33*   < > = values in this interval not displayed.    Estimated Creatinine Clearance: 55.2 mL/min (A) (by C-G formula based on SCr of 1.33 mg/dL (H)).   Assessment: 60 y.o. male presenting with RLE pain, determined to be RLE ischemia. PMH includes significant vascular disease with bypass on left leg, PAD on rivaroxaban 2.5 mg. Home rivaroxaban currently on hold (last dose 3/28 at 0800).    Patient s/p sheath removal on 4/1. Heparin level therapeutic. Hgb 13.7, PLT 220 are mildly downtrending. No signs of overt bleeding noted. Scheduled for Tuesday for right axillofemoral and possible femoropopliteal bypass with Dr. Donnetta Hutching.  Goal of Therapy:  Heparin level 0.3-0.7 units/ml Monitor platelets by anticoagulation protocol: Yes   Plan:  Continue heparin at 1600 units / hr Daily heparin level and CBC Monitor signs/symptoms of bleed.  F/u transition to home rivaroxaban pending procedures.  Norina Buzzard, PharmD PGY1 Pharmacy Resident 01/01/2021 7:49 AM

## 2021-01-02 DIAGNOSIS — S81801A Unspecified open wound, right lower leg, initial encounter: Secondary | ICD-10-CM | POA: Diagnosis not present

## 2021-01-02 LAB — CBC
HCT: 43.2 % (ref 39.0–52.0)
Hemoglobin: 13.7 g/dL (ref 13.0–17.0)
MCH: 27.6 pg (ref 26.0–34.0)
MCHC: 31.7 g/dL (ref 30.0–36.0)
MCV: 86.9 fL (ref 80.0–100.0)
Platelets: 222 10*3/uL (ref 150–400)
RBC: 4.97 MIL/uL (ref 4.22–5.81)
RDW: 15 % (ref 11.5–15.5)
WBC: 6.8 10*3/uL (ref 4.0–10.5)
nRBC: 0 % (ref 0.0–0.2)

## 2021-01-02 LAB — RENAL FUNCTION PANEL
Albumin: 2.9 g/dL — ABNORMAL LOW (ref 3.5–5.0)
Anion gap: 12 (ref 5–15)
BUN: 23 mg/dL — ABNORMAL HIGH (ref 6–20)
CO2: 24 mmol/L (ref 22–32)
Calcium: 9.7 mg/dL (ref 8.9–10.3)
Chloride: 102 mmol/L (ref 98–111)
Creatinine, Ser: 1.41 mg/dL — ABNORMAL HIGH (ref 0.61–1.24)
GFR, Estimated: 57 mL/min — ABNORMAL LOW (ref >60)
Glucose, Bld: 94 mg/dL (ref 70–99)
Phosphorus: 4.2 mg/dL (ref 2.5–4.6)
Potassium: 4.1 mmol/L (ref 3.5–5.1)
Sodium: 138 mmol/L (ref 135–145)

## 2021-01-02 LAB — HEPARIN LEVEL (UNFRACTIONATED): Heparin Unfractionated: 0.51 IU/mL (ref 0.30–0.70)

## 2021-01-02 MED ORDER — CEFAZOLIN SODIUM-DEXTROSE 2-4 GM/100ML-% IV SOLN
2.0000 g | INTRAVENOUS | Status: DC
  Filled 2021-01-02 (×2): qty 100

## 2021-01-02 NOTE — Progress Notes (Signed)
[facility]  Subjective  -no complaints   Objective (!) 165/75 68 98 F (36.7 C) (Oral) 19 100%  Intake/Output Summary (Last 24 hours) at 01/02/2021 0949 Last data filed at 01/02/2021 0719 Gross per 24 hour  Intake 480 ml  Output 1880 ml  Net -1400 ml    Right pretibial wound  Laboratory Lab Results: Recent Labs    01/01/21 0146 01/02/21 0441  WBC 6.2 6.8  HGB 13.7 13.7  HCT 41.5 43.2  PLT 220 222   BMET Recent Labs    12/31/20 0129 01/01/21 0146  NA 134* 135  K 3.8 3.9  CL 102 105  CO2 22 24  GLUCOSE 107* 149*  BUN 22* 21*  CREATININE 1.51* 1.33*  CALCIUM 9.3 9.2    COAG Lab Results  Component Value Date   INR 1.6 (H) 01/12/2020   INR 1.0 05/30/2019   INR 0.98 11/28/2017   No results found for: PTT  Assessment/Planning:  60 year old male with a complex medical history including functioning renal transplant and multiple previous left leg revascularizations that presents with critical limb ischemia of the right lower extremity with tissue loss.  Plan is right axillary to femoral artery bypass and possible right femoral to distal bypass with Dr. Donnetta Hutching tomorrow.  Dr. Donnetta Hutching has already discussed in detail options with the patient.  Please keep him n.p.o. after midnight.  Consent order placed in the chart.  Marty Heck 01/02/2021 9:49 AM --

## 2021-01-02 NOTE — Progress Notes (Addendum)
ANTICOAGULATION CONSULT NOTE - Follow Up Consult  Pharmacy Consult for IV heparin Indication: limb ischemia  Allergies  Allergen Reactions  . Percocet [Oxycodone-Acetaminophen] Hives and Itching  . Vicodin [Hydrocodone-Acetaminophen] Hives and Itching  . Shellfish Allergy Hives  . Chocolate Flavor Hives  . Chocolate Hives  . Tomato Hives    Patient Measurements: Height: 5\' 7"  (170.2 cm) Weight: 70.9 kg (156 lb 4.9 oz) IBW/kg (Calculated) : 66.1  Vital Signs: Temp: 98 F (36.7 C) (04/04 0718) Temp Source: Oral (04/04 0718) BP: 165/75 (04/04 0718) Pulse Rate: 68 (04/04 0718)  Labs: Recent Labs    12/31/20 0129 01/01/21 0146 01/02/21 0441  HGB 14.2 13.7 13.7  HCT 42.9 41.5 43.2  PLT 229 220 222  HEPARINUNFRC 0.31 0.34 0.51  CREATININE 1.51* 1.33*  --     Estimated Creatinine Clearance: 55.2 mL/min (A) (by C-G formula based on SCr of 1.33 mg/dL (H)).   Assessment: 60 y.o. male presenting with RLE pain, determined to be RLE ischemia. PMH includes significant vascular disease with bypass on left leg, PAD on rivaroxaban 2.5 mg PTA, currently on hold (last dose 3/28 at 0800).    Patient s/p sheath removal on 4/1, R axillofemoral and possible femoropopliteal bypass scheduled for 4/5. Heparin level therapeutic at 0.51. Hgb 13.7, PLT 222, stable. No signs of bleeding or problems with infusion per RN.   Goal of Therapy:  Heparin level 0.3-0.7 units/ml Monitor platelets by anticoagulation protocol: Yes   Plan:  Continue heparin at 1600 units / hr  Spoke with vascular -- plan is to stop heparin on call to the OR Follow daily heparin level and CBC Monitor signs/symptoms of bleed.  F/u transition to home rivaroxaban pending procedures.  Benna Dunks  PharmD Candidate, Class of 2022

## 2021-01-02 NOTE — Progress Notes (Signed)
Mobility Specialist - Progress Note   01/02/21 1205  Mobility  Activity Ambulated in hall  Level of Assistance Standby assist, set-up cues, supervision of patient - no hands on  Assistive Device Front wheel walker  Distance Ambulated (ft) 32 ft  Mobility Response Tolerated well  Mobility performed by Mobility specialist  $Mobility charge 1 Mobility   Pre-mobility: 76 HR, 182/99 (123) BP Post-mobility: 77 HR, 180/103 (127) BP  Pt initially felt lightheaded when standing, states he got up too fast. No lightheadedness on second trial. Distance limited by R LE pain. Pt back in bed after walk, RN notified of BP.   Pricilla Handler Mobility Specialist Mobility Specialist Phone: [phone]

## 2021-01-02 NOTE — H&P (View-Only) (Signed)
[facility]   Subjective  -no complaints   Objective (!) 165/75 68 98 F (36.7 C) (Oral) 19 100%  Intake/Output Summary (Last 24 hours) at 01/02/2021 0949 Last data filed at 01/02/2021 0719 Gross per 24 hour  Intake 480 ml  Output 1880 ml  Net -1400 ml    Right pretibial wound  Laboratory Lab Results: Recent Labs    01/01/21 0146 01/02/21 0441  WBC 6.2 6.8  HGB 13.7 13.7  HCT 41.5 43.2  PLT 220 222   BMET Recent Labs    12/31/20 0129 01/01/21 0146  NA 134* 135  K 3.8 3.9  CL 102 105  CO2 22 24  GLUCOSE 107* 149*  BUN 22* 21*  CREATININE 1.51* 1.33*  CALCIUM 9.3 9.2    COAG Lab Results  Component Value Date   INR 1.6 (H) 01/12/2020   INR 1.0 05/30/2019   INR 0.98 11/28/2017   No results found for: PTT  Assessment/Planning:  60 year old male with a complex medical history including functioning renal transplant and multiple previous left leg revascularizations that presents with critical limb ischemia of the right lower extremity with tissue loss.  Plan is right axillary to femoral artery bypass and possible right femoral to distal bypass with Dr. Donnetta Hutching tomorrow.  Dr. Donnetta Hutching has already discussed in detail options with the patient.  Please keep him n.p.o. after midnight.  Consent order placed in the chart.  Marty Heck 01/02/2021 9:49 AM --

## 2021-01-02 NOTE — Care Management Important Message (Signed)
Important Message  Patient Details  Name: Theodore Arellano MRN: [id number] Date of Birth: 29-Feb-1961   Medicare Important Message Given:  Yes     Shelda Altes 01/02/2021, 10:29 AM

## 2021-01-02 NOTE — Progress Notes (Signed)
Family Medicine Teaching Service Daily Progress Note Intern Pager: [phone]  Patient name: Theodore Arellano Medical record number: [id number] Date of birth: 08-29-1961 Age: 60 y.o. Gender: male  Primary Care Provider: [name], MD Consultants: Vascular surgery   Code Status: Full Code  Pt Overview and Major Events to Date:  3/29 admitted  Assessment and Plan: Theodore Arellano is a 60 y.o. male who presentedwith worsening RLE painconcerning for limb ischemia. PMH is significant forrenal transplant2011, CAD, type II DM, HLD, COVID-19 infection, cocaineuse,ESRD chronic left lower extremity wound followed by [facility].  PVD With multiple areas of demonstrated occlusion.  Stable at this time, planning for right axillary femoral bypass on 4/5 with vascular surgery.  Pain well controlled on current regimen. - scheduled Tylenol - PO hydromorphone 4 mg TID prn (home regimen) - vascular surgery following, plan for OR 4/5  Right lower extremity wound Poorly healing due to peripheral vascular disease.  Has completed a course of antibiotics and is not grossly infected at this time. - s/p vancomycin and metronidazole (3/29-3/30) - s/p amoxicillin-clavulanate (3/30-4/3)  PAD  CAD - ASA 81 mg daily - rosuvastatin 20 mg daily - heparin gtt - WOC consult  T2DM A1c 6.4.  Well controlled without any medications, not on any medications prior to admission.  HTN He has been hypertensive with SBP 160s to 180s, suspect some component secondary to pain.  He has been started on amlodipine this admission. - carvedilol 3.125 mg BID - amlodipine 5 mg, consider increasing if persistently hypertensive after procedure  Mobitz I heart block Has had intermittent bradycardia events this admission during sleep.  Beta-blocker dose has been reduced.  Heart rate has been within normal limits overnight. - continue to monitor  Neuropathy - gabapentin 200 mg TID  HLD Chronic, stable.  Most  recent LDL (16) at goal. - continue rosuvastatin 20 mg daily  GERD Chronic, stable. - pantoprazole 40 mg daily  ESRD s/p renal transplant, not currently on HD.  On multiple immunosuppressants. - F/u Tacrolimus - Continue tacrolimus - Continue mycophenolate - Continue daily prednisone - Continuehome sodium bicarb  Gout On colchicine prn. Chronic, stable. - hold colchicine  Substance use UDS positive for cocaine.  Also known tobacco user and alcohol user. - encourage cessation - nicotine patch prn  FEN/GI: regular diet, NPO after midnight PPx: heparin gtt  Disposition: cardiac progressive, SNF when medically stable  Subjective:  NAOE.  Patient states pain feels a little better compared to yesterday.  He is ready to get his procedure over with.  Objective: Temp:  [97.9 F (36.6 C)-98.6 F (37 C)] 97.9 F (36.6 C) (04/04 0307) Pulse Rate:  [60-100] 90 (04/04 0307) Resp:  [16-20] 20 (04/04 0307) BP: (150-188)/(84-105) 188/105 (04/04 0307) SpO2:  [98 %-100 %] 100 % (04/04 0307) Weight:  [70.9 kg] 70.9 kg (04/04 0307) Physical Exam: General: Resting comfortably in bed, NAD Cardiovascular: RRR, no murmurs Respiratory: CTAB Abdomen: soft, non-tender Extremities: WWP, no pain with palpation of bilateral feet, right pretibial wound without surrounding erythema or purulence  Laboratory: Recent Labs  Lab 12/31/20 0129 01/01/21 0146 01/02/21 0441  WBC 7.1 6.2 6.8  HGB 14.2 13.7 13.7  HCT 42.9 41.5 43.2  PLT 229 220 222   Recent Labs  Lab 12/27/20 0539 12/28/20 0356 12/30/20 0204 12/31/20 0129 01/01/21 0146  NA 136   < > 136 134* 135  K 4.0   < > 4.1 3.8 3.9  CL 104   < > 104  102 105  CO2 24   < > 22 22 24   BUN 17   < > 19 22* 21*  CREATININE 1.43*   < > 1.52* 1.51* 1.33*  CALCIUM 9.3   < > 9.5 9.3 9.2  PROT 7.8  --   --   --   --   BILITOT 0.8  --   --   --   --   ALKPHOS 76  --   --   --   --   ALT 18  --   --   --   --   AST 26  --   --   --   --    GLUCOSE 100*   < > 111* 107* 149*   < > = values in this interval not displayed.     Imaging/Diagnostic Tests: No new imaging.  Theodore Button, MD 01/02/2021, 6:54 AM PGY-1, [place] Intern pager: [phone], text pages welcome

## 2021-01-03 ENCOUNTER — Encounter (HOSPITAL_COMMUNITY): Payer: Self-pay | Admitting: Family Medicine

## 2021-01-03 ENCOUNTER — Inpatient Hospital Stay (HOSPITAL_COMMUNITY): Payer: Medicare Other | Admitting: Certified Registered"

## 2021-01-03 ENCOUNTER — Encounter (HOSPITAL_COMMUNITY)
Admission: EM | Disposition: A | Discharge status: Skilled Nursing Facility | Payer: Self-pay | Source: Home / Self Care | Attending: Family Medicine

## 2021-01-03 ENCOUNTER — Encounter (HOSPITAL_COMMUNITY): Payer: Medicare Other

## 2021-01-03 DIAGNOSIS — I70232 Atherosclerosis of native arteries of right leg with ulceration of calf: Secondary | ICD-10-CM | POA: Diagnosis not present

## 2021-01-03 HISTORY — PX: AXILLARY-FEMORAL BYPASS GRAFT: SHX894

## 2021-01-03 HISTORY — PX: FEMORAL-POPLITEAL BYPASS GRAFT: SHX937

## 2021-01-03 LAB — HEPARIN LEVEL (UNFRACTIONATED): Heparin Unfractionated: 0.37 IU/mL (ref 0.30–0.70)

## 2021-01-03 LAB — CBC
HCT: 40.6 % (ref 39.0–52.0)
Hemoglobin: 13.2 g/dL (ref 13.0–17.0)
MCH: 28 pg (ref 26.0–34.0)
MCHC: 32.5 g/dL (ref 30.0–36.0)
MCV: 86 fL (ref 80.0–100.0)
Platelets: 250 10*3/uL (ref 150–400)
RBC: 4.72 MIL/uL (ref 4.22–5.81)
RDW: 14.7 % (ref 11.5–15.5)
WBC: 8.8 10*3/uL (ref 4.0–10.5)
nRBC: 0 % (ref 0.0–0.2)

## 2021-01-03 LAB — SURGICAL PCR SCREEN
MRSA, PCR: POSITIVE — AB
Staphylococcus aureus: POSITIVE — AB

## 2021-01-03 LAB — GLUCOSE, CAPILLARY
Glucose-Capillary: 101 mg/dL — ABNORMAL HIGH (ref 70–99)
Glucose-Capillary: 90 mg/dL (ref 70–99)

## 2021-01-03 LAB — RENAL FUNCTION PANEL
Albumin: 3 g/dL — ABNORMAL LOW (ref 3.5–5.0)
Anion gap: 8 (ref 5–15)
BUN: 23 mg/dL — ABNORMAL HIGH (ref 6–20)
CO2: 26 mmol/L (ref 22–32)
Calcium: 9.4 mg/dL (ref 8.9–10.3)
Chloride: 103 mmol/L (ref 98–111)
Creatinine, Ser: 1.35 mg/dL — ABNORMAL HIGH (ref 0.61–1.24)
GFR, Estimated: >60 mL/min (ref >60)
Glucose, Bld: 124 mg/dL — ABNORMAL HIGH (ref 70–99)
Phosphorus: 3.9 mg/dL (ref 2.5–4.6)
Potassium: 3.9 mmol/L (ref 3.5–5.1)
Sodium: 137 mmol/L (ref 135–145)

## 2021-01-03 SURGERY — CREATION, BYPASS, ARTERIAL, AXILLARY TO BILATERAL FEMORAL, USING GRAFT
Anesthesia: General | Site: Leg Upper | Laterality: Right

## 2021-01-03 MED ORDER — LABETALOL HCL 5 MG/ML IV SOLN
INTRAVENOUS | Status: AC
  Filled 2021-01-03: qty 4

## 2021-01-03 MED ORDER — MIDAZOLAM HCL 5 MG/5ML IJ SOLN
INTRAMUSCULAR | Status: DC | PRN
  Administered 2021-01-03: 2 mg via INTRAVENOUS

## 2021-01-03 MED ORDER — SODIUM CHLORIDE 0.9 % IV SOLN: INTRAVENOUS | Status: DC

## 2021-01-03 MED ORDER — HYDRALAZINE HCL 20 MG/ML IJ SOLN: 5.0000 mg | INTRAMUSCULAR | Status: DC | PRN

## 2021-01-03 MED ORDER — SUGAMMADEX SODIUM 200 MG/2ML IV SOLN
INTRAVENOUS | Status: DC | PRN
  Administered 2021-01-03: 200 mg via INTRAVENOUS

## 2021-01-03 MED ORDER — CHLORHEXIDINE GLUCONATE CLOTH 2 % EX PADS
6.0000 | MEDICATED_PAD | Freq: Every day | CUTANEOUS | Status: DC
  Administered 2021-01-05: 6 via TOPICAL

## 2021-01-03 MED ORDER — PHENYLEPHRINE 40 MCG/ML (10ML) SYRINGE FOR IV PUSH (FOR BLOOD PRESSURE SUPPORT)
PREFILLED_SYRINGE | INTRAVENOUS | Status: AC
  Filled 2021-01-03: qty 20

## 2021-01-03 MED ORDER — MORPHINE SULFATE (PF) 2 MG/ML IV SOLN
2.0000 mg | INTRAVENOUS | Status: DC | PRN
Start: 2021-01-03 — End: 2021-01-04
  Administered 2021-01-03: 2 mg via INTRAVENOUS
  Filled 2021-01-03 (×2): qty 1

## 2021-01-03 MED ORDER — ACETAMINOPHEN 650 MG RE SUPP: 325.0000 mg | RECTAL | Status: DC | PRN

## 2021-01-03 MED ORDER — PROTAMINE SULFATE 10 MG/ML IV SOLN
INTRAVENOUS | Status: DC | PRN
  Administered 2021-01-03: 50 mg via INTRAVENOUS

## 2021-01-03 MED ORDER — HEPARIN SODIUM (PORCINE) 1000 UNIT/ML IJ SOLN
INTRAMUSCULAR | Status: DC | PRN
  Administered 2021-01-03: 8000 [IU] via INTRAVENOUS
  Administered 2021-01-03: 3000 [IU] via INTRAVENOUS

## 2021-01-03 MED ORDER — PHENOL 1.4 % MT LIQD: 1.0000 | OROMUCOSAL | Status: DC | PRN

## 2021-01-03 MED ORDER — LIDOCAINE 2% (20 MG/ML) 5 ML SYRINGE
INTRAMUSCULAR | Status: DC | PRN
  Administered 2021-01-03: 100 mg via INTRAVENOUS

## 2021-01-03 MED ORDER — LIDOCAINE 2% (20 MG/ML) 5 ML SYRINGE
INTRAMUSCULAR | Status: AC
  Filled 2021-01-03: qty 5

## 2021-01-03 MED ORDER — FENTANYL CITRATE (PF) 100 MCG/2ML IJ SOLN
INTRAMUSCULAR | Status: AC
  Filled 2021-01-03: qty 2

## 2021-01-03 MED ORDER — HYDROMORPHONE HCL 1 MG/ML IJ SOLN
INTRAMUSCULAR | Status: DC | PRN
  Administered 2021-01-03 (×2): .5 mg via INTRAVENOUS

## 2021-01-03 MED ORDER — MIDAZOLAM HCL 2 MG/2ML IJ SOLN
INTRAMUSCULAR | Status: AC
  Filled 2021-01-03: qty 2

## 2021-01-03 MED ORDER — ROCURONIUM BROMIDE 10 MG/ML (PF) SYRINGE
PREFILLED_SYRINGE | INTRAVENOUS | Status: DC | PRN
  Administered 2021-01-03: 50 mg via INTRAVENOUS
  Administered 2021-01-03: 30 mg via INTRAVENOUS
  Administered 2021-01-03: 50 mg via INTRAVENOUS

## 2021-01-03 MED ORDER — EPHEDRINE 5 MG/ML INJ
INTRAVENOUS | Status: AC
  Filled 2021-01-03: qty 10

## 2021-01-03 MED ORDER — 0.9 % SODIUM CHLORIDE (POUR BTL) OPTIME
TOPICAL | Status: DC | PRN
  Administered 2021-01-03: 500 mL

## 2021-01-03 MED ORDER — EPHEDRINE SULFATE-NACL 50-0.9 MG/10ML-% IV SOSY
PREFILLED_SYRINGE | INTRAVENOUS | Status: DC | PRN
  Administered 2021-01-03: 5 mg via INTRAVENOUS
  Administered 2021-01-03 (×2): 10 mg via INTRAVENOUS

## 2021-01-03 MED ORDER — HEPARIN (PORCINE) 25000 UT/250ML-% IV SOLN
1600.0000 [IU]/h | INTRAVENOUS | Status: DC
  Administered 2021-01-04: 1600 [IU]/h via INTRAVENOUS
  Filled 2021-01-03: qty 250

## 2021-01-03 MED ORDER — SODIUM CHLORIDE 0.9 % IV SOLN
INTRAVENOUS | Status: AC
  Filled 2021-01-03: qty 1.2

## 2021-01-03 MED ORDER — POTASSIUM CHLORIDE CRYS ER 20 MEQ PO TBCR: 20.0000 meq | EXTENDED_RELEASE_TABLET | Freq: Every day | ORAL | Status: DC | PRN

## 2021-01-03 MED ORDER — ORAL CARE MOUTH RINSE: 15.0000 mL | Freq: Once | OROMUCOSAL | Status: AC

## 2021-01-03 MED ORDER — DEXAMETHASONE SODIUM PHOSPHATE 10 MG/ML IJ SOLN
INTRAMUSCULAR | Status: AC
  Filled 2021-01-03: qty 1

## 2021-01-03 MED ORDER — GUAIFENESIN-DM 100-10 MG/5ML PO SYRP: 15.0000 mL | ORAL_SOLUTION | ORAL | Status: DC | PRN

## 2021-01-03 MED ORDER — LACTATED RINGERS IV SOLN: INTRAVENOUS | Status: DC

## 2021-01-03 MED ORDER — PROPOFOL 10 MG/ML IV BOLUS
INTRAVENOUS | Status: DC | PRN
  Administered 2021-01-03: 150 mg via INTRAVENOUS

## 2021-01-03 MED ORDER — PHENYLEPHRINE 40 MCG/ML (10ML) SYRINGE FOR IV PUSH (FOR BLOOD PRESSURE SUPPORT)
PREFILLED_SYRINGE | INTRAVENOUS | Status: DC | PRN
  Administered 2021-01-03: 80 ug via INTRAVENOUS

## 2021-01-03 MED ORDER — FENTANYL CITRATE (PF) 250 MCG/5ML IJ SOLN
INTRAMUSCULAR | Status: AC
  Filled 2021-01-03: qty 5

## 2021-01-03 MED ORDER — HYDROMORPHONE HCL 1 MG/ML IJ SOLN
INTRAMUSCULAR | Status: AC
  Filled 2021-01-03: qty 0.5

## 2021-01-03 MED ORDER — SODIUM CHLORIDE 0.9 % IV SOLN: 500.0000 mL | Freq: Once | INTRAVENOUS | Status: DC | PRN

## 2021-01-03 MED ORDER — LACTATED RINGERS IV SOLN: INTRAVENOUS | Status: DC | PRN

## 2021-01-03 MED ORDER — CEFAZOLIN SODIUM-DEXTROSE 2-4 GM/100ML-% IV SOLN
2.0000 g | Freq: Three times a day (TID) | INTRAVENOUS | Status: AC
  Administered 2021-01-03 – 2021-01-04 (×2): 2 g via INTRAVENOUS
  Filled 2021-01-03 (×2): qty 100

## 2021-01-03 MED ORDER — PROPOFOL 10 MG/ML IV BOLUS
INTRAVENOUS | Status: AC
  Filled 2021-01-03: qty 20

## 2021-01-03 MED ORDER — SODIUM CHLORIDE 0.9 % IV SOLN
INTRAVENOUS | Status: DC | PRN
  Administered 2021-01-03: 500 mL

## 2021-01-03 MED ORDER — ONDANSETRON HCL 4 MG/2ML IJ SOLN
INTRAMUSCULAR | Status: DC | PRN
  Administered 2021-01-03: 4 mg via INTRAVENOUS

## 2021-01-03 MED ORDER — ROCURONIUM BROMIDE 10 MG/ML (PF) SYRINGE
PREFILLED_SYRINGE | INTRAVENOUS | Status: AC
  Filled 2021-01-03: qty 10

## 2021-01-03 MED ORDER — FENTANYL CITRATE (PF) 250 MCG/5ML IJ SOLN
INTRAMUSCULAR | Status: DC | PRN
  Administered 2021-01-03 (×2): 50 ug via INTRAVENOUS
  Administered 2021-01-03: 100 ug via INTRAVENOUS
  Administered 2021-01-03: 50 ug via INTRAVENOUS

## 2021-01-03 MED ORDER — OXYCODONE-ACETAMINOPHEN 5-325 MG PO TABS: 1.0000 | ORAL_TABLET | ORAL | Status: DC | PRN

## 2021-01-03 MED ORDER — CEFAZOLIN SODIUM-DEXTROSE 2-3 GM-%(50ML) IV SOLR
INTRAVENOUS | Status: DC | PRN
  Administered 2021-01-03: 2 g via INTRAVENOUS

## 2021-01-03 MED ORDER — LABETALOL HCL 5 MG/ML IV SOLN
10.0000 mg | INTRAVENOUS | Status: DC | PRN
  Administered 2021-01-03 – 2021-01-04 (×2): 10 mg via INTRAVENOUS
  Filled 2021-01-03: qty 4

## 2021-01-03 MED ORDER — METOPROLOL TARTRATE 5 MG/5ML IV SOLN
2.0000 mg | INTRAVENOUS | Status: DC | PRN
Start: 2021-01-03 — End: 2021-01-04

## 2021-01-03 MED ORDER — CHLORHEXIDINE GLUCONATE 0.12 % MT SOLN: 15.0000 mL | Freq: Once | OROMUCOSAL | Status: AC

## 2021-01-03 MED ORDER — MAGNESIUM SULFATE 2 GM/50ML IV SOLN: 2.0000 g | Freq: Every day | INTRAVENOUS | Status: DC | PRN

## 2021-01-03 MED ORDER — ALUM & MAG HYDROXIDE-SIMETH 200-200-20 MG/5ML PO SUSP: 15.0000 mL | ORAL | Status: DC | PRN

## 2021-01-03 MED ORDER — PHENYLEPHRINE HCL-NACL 10-0.9 MG/250ML-% IV SOLN
INTRAVENOUS | Status: DC | PRN
  Administered 2021-01-03: 40 ug/min via INTRAVENOUS

## 2021-01-03 MED ORDER — MUPIROCIN 2 % EX OINT
1.0000 "application " | TOPICAL_OINTMENT | Freq: Two times a day (BID) | CUTANEOUS | Status: DC
  Administered 2021-01-03 – 2021-01-07 (×4): 1 via NASAL
  Filled 2021-01-03 (×2): qty 22

## 2021-01-03 MED ORDER — CHLORHEXIDINE GLUCONATE 0.12 % MT SOLN
OROMUCOSAL | Status: AC
  Administered 2021-01-03: 15 mL via OROMUCOSAL
  Filled 2021-01-03: qty 15

## 2021-01-03 MED ORDER — ONDANSETRON HCL 4 MG/2ML IJ SOLN
INTRAMUSCULAR | Status: AC
  Filled 2021-01-03: qty 2

## 2021-01-03 MED ORDER — FENTANYL CITRATE (PF) 100 MCG/2ML IJ SOLN
25.0000 ug | INTRAMUSCULAR | Status: DC | PRN
  Administered 2021-01-03 (×2): 50 ug via INTRAVENOUS

## 2021-01-03 MED ORDER — ACETAMINOPHEN 325 MG PO TABS: 325.0000 mg | ORAL_TABLET | ORAL | Status: DC | PRN

## 2021-01-03 SURGICAL SUPPLY — 60 items
ADH SKN CLS APL DERMABOND .7 (GAUZE/BANDAGES/DRESSINGS) ×6
BANDAGE ESMARK 6X9 LF (GAUZE/BANDAGES/DRESSINGS) IMPLANT
BLADE SURG 11 STRL SS (BLADE) ×1 IMPLANT
BNDG CMPR 9X6 STRL LF SNTH (GAUZE/BANDAGES/DRESSINGS) ×2
BNDG ESMARK 6X9 LF (GAUZE/BANDAGES/DRESSINGS) ×3
CANISTER SUCT 3000ML PPV (MISCELLANEOUS) ×3 IMPLANT
CANNULA VESSEL 3MM 2 BLNT TIP (CANNULA) ×6 IMPLANT
CLIP LIGATING EXTRA MED SLVR (CLIP) ×6 IMPLANT
CLIP LIGATING EXTRA SM BLUE (MISCELLANEOUS) ×6 IMPLANT
COVER WAND RF STERILE (DRAPES) ×3 IMPLANT
CUFF TOURN SGL QUICK 24 (TOURNIQUET CUFF) ×3
CUFF TOURN SGL QUICK 34 (TOURNIQUET CUFF)
CUFF TOURN SGL QUICK 42 (TOURNIQUET CUFF) IMPLANT
CUFF TRNQT CYL 24X4X16.5-23 (TOURNIQUET CUFF) IMPLANT
CUFF TRNQT CYL 34X4.125X (TOURNIQUET CUFF) IMPLANT
DERMABOND ADVANCED (GAUZE/BANDAGES/DRESSINGS) ×3
DERMABOND ADVANCED .7 DNX12 (GAUZE/BANDAGES/DRESSINGS) ×2 IMPLANT
DRAIN SNY 10X20 3/4 PERF (WOUND CARE) IMPLANT
DRAPE HALF SHEET 40X57 (DRAPES) IMPLANT
DRAPE INCISE IOBAN 66X45 STRL (DRAPES) ×3 IMPLANT
DRAPE X-RAY CASS 24X20 (DRAPES) IMPLANT
ELECT REM PT RETURN 9FT ADLT (ELECTROSURGICAL) ×3
ELECTRODE REM PT RTRN 9FT ADLT (ELECTROSURGICAL) ×2 IMPLANT
EVACUATOR SILICONE 100CC (DRAIN) IMPLANT
GAUZE SPONGE 4X4 12PLY STRL (GAUZE/BANDAGES/DRESSINGS) ×3 IMPLANT
GLOVE SS BIOGEL STRL SZ 7.5 (GLOVE) ×2 IMPLANT
GLOVE SUPERSENSE BIOGEL SZ 7.5 (GLOVE) ×1
GLOVE SURG UNDER POLY LF SZ6.5 (GLOVE) ×1 IMPLANT
GOWN STRL REUS W/ TWL LRG LVL3 (GOWN DISPOSABLE) ×6 IMPLANT
GOWN STRL REUS W/TWL LRG LVL3 (GOWN DISPOSABLE) ×9
GRAFT CV 60X8STRG TUBE KNTD (Vascular Products) IMPLANT
GRAFT HEMASHIELD 8MM (Vascular Products) ×3 IMPLANT
GRAFT PROPATEN W/RING 6X80X60 (Vascular Products) ×1 IMPLANT
INSERT FOGARTY SM (MISCELLANEOUS) IMPLANT
KIT BASIN OR (CUSTOM PROCEDURE TRAY) ×3 IMPLANT
KIT TURNOVER KIT B (KITS) ×3 IMPLANT
LOOP VESSEL MINI RED (MISCELLANEOUS) ×2 IMPLANT
NS IRRIG 1000ML POUR BTL (IV SOLUTION) ×6 IMPLANT
PACK PERIPHERAL VASCULAR (CUSTOM PROCEDURE TRAY) ×3 IMPLANT
PAD ARMBOARD 7.5X6 YLW CONV (MISCELLANEOUS) ×6 IMPLANT
PADDING CAST COTTON 6X4 STRL (CAST SUPPLIES) ×1 IMPLANT
SET COLLECT BLD 21X3/4 12 (NEEDLE) IMPLANT
STAPLER VISISTAT 35W (STAPLE) IMPLANT
STOPCOCK 4 WAY LG BORE MALE ST (IV SETS) IMPLANT
SUT ETHILON 3 0 PS 1 (SUTURE) IMPLANT
SUT PROLENE 5 0 C 1 24 (SUTURE) ×3 IMPLANT
SUT PROLENE 5 0 CC 1 (SUTURE) ×3 IMPLANT
SUT PROLENE 6 0 CC (SUTURE) ×5 IMPLANT
SUT SILK 2 0 PERMA HAND 18 BK (SUTURE) IMPLANT
SUT SILK 2 0 SH (SUTURE) ×3 IMPLANT
SUT SILK 3 0 (SUTURE) ×3
SUT SILK 3-0 18XBRD TIE 12 (SUTURE) IMPLANT
SUT VIC AB 2-0 CTX 36 (SUTURE) ×6 IMPLANT
SUT VIC AB 3-0 SH 27 (SUTURE) ×6
SUT VIC AB 3-0 SH 27X BRD (SUTURE) ×4 IMPLANT
TOWEL GREEN STERILE (TOWEL DISPOSABLE) ×3 IMPLANT
TRAY FOLEY MTR SLVR 16FR STAT (SET/KITS/TRAYS/PACK) ×3 IMPLANT
TUBING EXTENTION W/L.L. (IV SETS) IMPLANT
UNDERPAD 30X36 HEAVY ABSORB (UNDERPADS AND DIAPERS) ×3 IMPLANT
WATER STERILE IRR 1000ML POUR (IV SOLUTION) ×3 IMPLANT

## 2021-01-03 NOTE — Anesthesia Procedure Notes (Signed)
Procedure Name: Intubation Date/Time: 01/03/2021 11:36 AM Performed by: Amadeo Garnet, CRNA Pre-anesthesia Checklist: Patient identified, Emergency Drugs available, Suction available and Patient being monitored Patient Re-evaluated:Patient Re-evaluated prior to induction Oxygen Delivery Method: Circle system utilized Preoxygenation: Pre-oxygenation with 100% oxygen Induction Type: IV induction Ventilation: Mask ventilation without difficulty and Oral airway inserted - appropriate to patient size Laryngoscope Size: Mac and 4 Grade View: Grade I Tube type: Oral Tube size: 7.5 mm Number of attempts: 1 Airway Equipment and Method: Stylet and Oral airway Placement Confirmation: ETT inserted through vocal cords under direct vision,  positive ETCO2 and breath sounds checked- equal and bilateral Secured at: 23 cm Tube secured with: Tape Dental Injury: Teeth and Oropharynx as per pre-operative assessment

## 2021-01-03 NOTE — Progress Notes (Signed)
Pt arrived back to 4e from PACU. Vitals obtained. Pedal and PT pulses dopplered in right foot. Foley in place. Left radial a-line zeroed and reading with good waveform. Right leg and groin incisions clean, dry, and intact.

## 2021-01-03 NOTE — Anesthesia Preprocedure Evaluation (Signed)
Anesthesia Evaluation  Patient identified by MRN, date of birth, ID band Patient awake    Reviewed: Allergy & Precautions, Patient's Chart, lab work & pertinent test results  Airway Mallampati: II  TM Distance: >3 FB     Dental   Pulmonary Current Smoker and Patient abstained from smoking.,    breath sounds clear to auscultation       Cardiovascular hypertension, + angina + CAD and + Peripheral Vascular Disease  + dysrhythmias  Rhythm:Regular Rate:Normal     Neuro/Psych PSYCHIATRIC DISORDERS Anxiety Schizophrenia    GI/Hepatic negative GI ROS, Neg liver ROS,   Endo/Other  diabetes  Renal/GU Renal disease     Musculoskeletal  (+) Arthritis ,   Abdominal   Peds  Hematology  (+) anemia ,   Anesthesia Other Findings   Reproductive/Obstetrics                             Anesthesia Physical Anesthesia Plan  ASA: III  Anesthesia Plan: General   Post-op Pain Management:    Induction: Intravenous  PONV Risk Score and Plan: 2 and Ondansetron, Dexamethasone and Midazolam  Airway Management Planned: Oral ETT  Additional Equipment:   Intra-op Plan:   Post-operative Plan: Possible Post-op intubation/ventilation  Informed Consent: I have reviewed the patients History and Physical, chart, labs and discussed the procedure including the risks, benefits and alternatives for the proposed anesthesia with the patient or authorized representative who has indicated his/her understanding and acceptance.     Dental advisory given  Plan Discussed with: CRNA and Anesthesiologist  Anesthesia Plan Comments:         Anesthesia Quick Evaluation

## 2021-01-03 NOTE — Plan of Care (Signed)

## 2021-01-03 NOTE — Op Note (Signed)
OPERATIVE REPORT  DATE OF SURGERY: 01/03/2021  PATIENT: Theodore Arellano, 60 y.o. male MRN: [id number]  DOB: Feb 16, 1961  PRE-OPERATIVE DIAGNOSIS: Nonhealing wound right pretibial area  POST-OPERATIVE DIAGNOSIS:  Same  PROCEDURE: #1 right axillary to profundus femoris bypass with 8 mm Dacron graft, #2 right femoral to below-knee popliteal bypass with 6 mm propatent Gore-Tex graft  SURGEON:  Curt Jews, M.D.  PHYSICIAN ASSISTANT: Setzer PAC  The assistant was needed for exposure and to expedite the case  ANESTHESIA: General  EBL: per anesthesia record  Total I/O In: 1550 [I.V.:1500; IV Piggyback:50] Out: 1200 [Urine:1100; Blood:100]  BLOOD ADMINISTERED: none  DRAINS: none  SPECIMEN: none  COUNTS CORRECT:  YES  PATIENT DISPOSITION:  PACU - hemodynamically stable  PROCEDURE DETAILS: The patient was taken to room placed evaluation where the area of the right neck chest right groin and right leg and foot were prepped and draped in usual sterile fashion.  Incision was made under the right clavicle and carried down to isolate the axillary artery.  The pectoralis muscle may was divided in line with the fibers and the pectoralis major minor was bluntly dissected free.  The axillary vein was identified and was mobilized inferiorly.  Patient had the several small branches off the subclavian artery and these were ligated with 3-0 silk ties and divided.  Next a separate incision was made over the prior groin incision.  The patient had an old nonfunctional right femoral loop graft.  The graft was arising off the superficial femoral which was chronically occluded.  The external iliac and common femoral arteries were also chronically occluded.  The profundus femoris artery was extremely calcified.  The profundus femoris artery was dissected down to the tertiary branches and these were controlled with Vesseloops.  Finally a separate incision was made in the medial approach to the below-knee  popliteal artery.  The artery was extensively calcified but did have a soft area on the medial aspect for anastomosis.  A tunnel was created from the level of the groin to the axillary artery and a 8 mm Dacron graft was brought through the tunnel.  A separate tunnel was made from the below-knee popliteal artery to the groin.  The patient was given 8000 intravenous heparin affect circulation time the axillary artery was occluded proximally distally and was opened with an 11 blade and sent longitudinally with Potts scissors.  The artery had minimal atherosclerotic change.  The Dacron graft was sewn end-to-side to the artery with a running 5-0 Prolene suture.  This anastomosis was tested and found to be adequate.  Next incision was made over the common femoral artery which was chronically occluded.  The superficial femoral artery was also chronically occluded.  Decision was made to anastomose end-to-side to the profundus femoris artery.  The profundus femoris was endarterectomized.  The common femoral was ligated proximally and the superficial femoral artery was ligated distally.  The endarterectomy skidded onto the branches of the profunda and there was good backbleeding from these.  The plaque was tacked with several 6-0 Prolene sutures.  The Dacron graft was cut to the appropriate length and was spatulated and sewn into into the profundus femoris artery with a running 6-0 Prolene suture.  Prior to completion of the closure the usual flushing maneuvers were undertaken and anastomosis was completed.  Next the propatent graft was brought onto the field.  An incision was made on the hood of the graft and the propatent graft was spatulated and sewn end-to-side  to the hood of the Dacron graft at the femoral anastomosis.  This was with a running 5-0 Prolene suture.  This anastomosis was tested and found to be adequate.  The propatent graft was brought through the prior created tunnel down to the level of the below-knee  popliteal artery.  The graft was flushed with heparinized saline and reoccluded.  Next the pneumatic tourniquet was placed around the distal thigh.  The leg was elevated and exsanguinated with an Esmarch tourniquet and the pneumatic tourniquet was inflated.  The below-knee popliteal artery was opened on the medial side of this area soft area.  The artery was extremely calcified but did have a soft area on the medial aspect.  The Gore-Tex graft was cut to the appropriate length and was spatulated and sewn end-to-side to the artery with a running 6-0 Prolene suture.  Prior to completion of the anastomosis the pneumatic tourniquet was deflated and the usual flushing maneuvers were undertaken.  Anastomosis was completed and clamps were removed and graft dependent flow was noted.  The patient was given 50 mg of protamine reverse heparin.  Wounds irrigated with saline.  Hemostasis left cautery.  The wounds were closed with 2-0 Vicryl in the all wounds in the fascia.  Skin was closed with 3-0 subcuticular Vicryl stitch.  Patient was transferred to the recovery room in stable condition   Rosetta Posner, M.D., [facility] 01/03/2021 4:34 PM  Note: Portions of this report may have been transcribed using voice recognition software.  Every effort has been made to ensure accuracy; however, inadvertent computerized transcription errors may still be present.

## 2021-01-03 NOTE — Progress Notes (Signed)
OT Cancellation Note  Patient Details Name: Theodore Arellano MRN: [id number] DOB: 11-17-1960   Cancelled Treatment:     Pt off floor for procedure. Will follow up as able.   Corinne Ports E. Prateek Knipple, COTA/L Acute Rehabilitation Services [phone] [place] 01/03/2021, 11:04 AM

## 2021-01-03 NOTE — Progress Notes (Addendum)
ANTICOAGULATION CONSULT NOTE - Follow Up Consult  Pharmacy Consult for IV heparin Indication: limb ischemia  Allergies  Allergen Reactions  . Percocet [Oxycodone-Acetaminophen] Hives and Itching  . Vicodin [Hydrocodone-Acetaminophen] Hives and Itching  . Shellfish Allergy Hives  . Chocolate Flavor Hives  . Chocolate Hives  . Tomato Hives    Patient Measurements: Height: 5\' 7"  (170.2 cm) Weight: 71.9 kg (158 lb 8.2 oz) IBW/kg (Calculated) : 66.1  Vital Signs: Temp: 97.9 F (36.6 C) (04/05 0305) Temp Source: Oral (04/05 0305) BP: 119/83 (04/05 0305) Pulse Rate: 58 (04/05 0305)  Labs: Recent Labs    01/01/21 0146 01/02/21 0441 01/02/21 0942 01/03/21 0208  HGB 13.7 13.7  --  13.2  HCT 41.5 43.2  --  40.6  PLT 220 222  --  250  HEPARINUNFRC 0.34 0.51  --  0.37  CREATININE 1.33*  --  1.41* 1.35*    Estimated Creatinine Clearance: 54.4 mL/min (A) (by C-G formula based on SCr of 1.35 mg/dL (H)).   Assessment: 60 y.o. male presenting with RLE pain, determined to be RLE ischemia. PMH includes significant vascular disease with bypass on left leg, PAD on rivaroxaban 2.5 mg PTA, currently on hold (last dose 3/28 at 0800).    R axillofemoral and possible femoropopliteal bypass scheduled for today. Heparin level therapeutic at 0.37. Hgb 13.2, PLT 250. No signs of bleeding or problems with infusion per RN.   Goal of Therapy:  Heparin level 0.3-0.7 units/ml Monitor platelets by anticoagulation protocol: Yes   Plan:  Continue heparin at 1600 units/hr  Follow daily heparin level and CBC Monitor signs/symptoms of bleed.  Follow-up anticoagulation plan following procedure.  Benna Dunks  PharmD Candidate, Class of 2022

## 2021-01-03 NOTE — Interval H&P Note (Signed)
History and Physical Interval Note:  01/03/2021 10:42 AM  Theodore Arellano  has presented today for surgery, with the diagnosis of non healing wound on right lower extremity.  The various methods of treatment have been discussed with the patient and family. After consideration of risks, benefits and other options for treatment, the patient has consented to  Procedure(s): RIGHT AXILLARY TO FEMORAL BYPASS (Right) POSSIBLE RIGHT FEMORAL TO POPLITEAL ARTERY BYPASS (Right) as a surgical intervention.  The patient's history has been reviewed, patient examined, no change in status, stable for surgery.  I have reviewed the patient's chart and labs.  Questions were answered to the patient's satisfaction.     Curt Jews

## 2021-01-03 NOTE — Anesthesia Procedure Notes (Signed)
Arterial Line Insertion Start/End4/01/2021 10:10 AM, 01/03/2021 10:20 AM Performed by: Rande Brunt, CRNA  Patient location: Pre-op. Preanesthetic checklist: patient identified, IV checked, site marked, risks and benefits discussed, surgical consent, monitors and equipment checked, pre-op evaluation, timeout performed and anesthesia consent Lidocaine 1% used for infiltration Left, radial was placed Catheter size: 20 G Hand hygiene performed  and maximum sterile barriers used   Attempts: 1 Procedure performed without using ultrasound guided technique. Following insertion, dressing applied and Biopatch. Post procedure assessment: normal and unchanged  Patient tolerated the procedure well with no immediate complications.

## 2021-01-03 NOTE — Progress Notes (Addendum)
Family Medicine Teaching Service Daily Progress Note Intern Pager: [phone]  Patient name: Theodore Arellano Medical record number: [id number] Date of birth: [birth year] Age: 60 y.o. Gender: male  Primary Care Provider: [name], MD Consultants: Vascular surgery   Code Status: Full Code  Pt Overview and Major Events to Date:  3/29 admitted  Assessment and Plan: Theodore Arellano is a 60 y.o. male who presentedwith worsening RLE painconcerning for limb ischemia. PMH is significant forrenal transplant2011, CAD, type II DM, HLD, COVID-19 infection, cocaineuse,ESRD chronic left lower extremity wound followed by [facility].  PVD With multiple areas of demonstrated occlusion.  Stable at this time, planning for right axillary femoral bypass today with vascular surgery.  Pain well controlled on current regimen.  Suspect right pretibial pain likely secondary to underlying PAD. - scheduled Tylenol - PO hydromorphone 4 mg TID prn (home regimen) - vascular surgery following, plan for OR today  Right lower extremity wound Poorly healing due to peripheral vascular disease.  Has completed a course of antibiotics and is not grossly infected at this time. - s/p vancomycin and metronidazole (3/29-3/30) - s/p amoxicillin-clavulanate (3/30-4/3)  PAD  CAD - ASA 81 mg daily - rosuvastatin 20 mg daily - heparin gtt - WOC consult  T2DM A1c 6.4.  Well controlled without any medications, not on any medications prior to admission.  HTN He has been hypertensive with SBP 160s to 180s, suspect some component secondary to pain.  He has been started on amlodipine this admission. - carvedilol 3.125 mg BID - amlodipine 5 mg, consider increasing if persistently hypertensive after procedure  Mobitz I heart block Has had intermittent bradycardia events this admission during sleep.  Beta-blocker dose has been reduced.  Heart rate has been within normal limits. - continue to monitor  Neuropathy -  gabapentin 200 mg TID  HLD Chronic, stable.  Most recent LDL (16) at goal. - continue rosuvastatin 20 mg daily  GERD Chronic, stable. - pantoprazole 40 mg daily  ESRD s/p renal transplant, not currently on HD.  On multiple immunosuppressants. - F/u Tacrolimus - Continue tacrolimus - Continue mycophenolate - Continue daily prednisone - Continuehome sodium bicarb  Gout On colchicine prn. Chronic, stable.  Low suspicion that right pretibial pain is due to gout. - hold colchicine  Substance use UDS positive for cocaine.  Also known tobacco user and alcohol user. - encourage cessation - nicotine patch prn  FEN/GI: NPO PPx: heparin gtt  Disposition: cardiac progressive, SNF when medically stable  Subjective:  NAOE.  Patient reports having some pain in his right lower leg (pointing to his right proximal pretibial area) starting last night and believes it is due to his gout.  Objective: Temp:  [97.3 F (36.3 C)-98 F (36.7 C)] 97.9 F (36.6 C) (04/05 0305) Pulse Rate:  [58-100] 58 (04/05 0305) Resp:  [17-20] 18 (04/05 0305) BP: (119-195)/(75-102) 119/83 (04/05 0305) SpO2:  [97 %-100 %] 100 % (04/05 0305) Weight:  [71.9 kg] 71.9 kg (04/05 0305) Physical Exam: General: Sitting up comfortably in bed, NAD Cardiovascular: RRR, no murmurs Respiratory: CTAB Abdomen: soft, non-tender Extremities: WWP, no pain with palpation of bilateral feet, right pretibial wound without surrounding erythema or purulence, point tenderness to palpation to proximal right pretibial area without erythema or swelling  Laboratory: Recent Labs  Lab 01/01/21 0146 01/02/21 0441 01/03/21 0208  WBC 6.2 6.8 8.8  HGB 13.7 13.7 13.2  HCT 41.5 43.2 40.6  PLT 220 222 250   Recent Labs  Lab 01/01/21  9924 01/02/21 0942 01/03/21 0208  NA 135 138 137  K 3.9 4.1 3.9  CL 105 102 103  CO2 24 24 26   BUN 21* 23* 23*  CREATININE 1.33* 1.41* 1.35*  CALCIUM 9.2 9.7 9.4  GLUCOSE 149* 94 124*      Imaging/Diagnostic Tests: No new imaging.  Zola Button, MD 01/03/2021, 7:15 AM PGY-1, [place] Intern pager: [phone], text pages welcome

## 2021-01-03 NOTE — Progress Notes (Addendum)
PT Cancellation Note  Patient Details Name: Theodore Arellano MRN: [id number] DOB: 18-Apr-1961   Cancelled Treatment:    Reason Eval/Treat Not Completed: (P) Patient at procedure or test/unavailable (pt at OR for procedure at 9:47AM, checked again 2:24 PM still off floor.) Will continue efforts next day if medically appropriate per PT POC.    Kara Pacer [name] 01/03/2021, 9:47 AM

## 2021-01-03 NOTE — Transfer of Care (Signed)
Immediate Anesthesia Transfer of Care Note  Patient: Theodore Arellano  Procedure(s) Performed: RIGHT AXILLARY TO PROFUNDA FEMORAL BYPASS (Right Abdomen) RIGHT FEMORAL TO BELOW KNEE POPLITEAL ARTERY BYPASS (Right Leg Upper)  Patient Location: PACU  Anesthesia Type:General  Level of Consciousness: awake, alert  and oriented  Airway & Oxygen Therapy: Patient Spontanous Breathing and Patient connected to face mask oxygen  Post-op Assessment: Report given to RN, Post -op Vital signs reviewed and stable and Patient moving all extremities  Post vital signs: Reviewed and stable  Last Vitals:  Vitals Value Taken Time  BP 145/79 01/03/21 1604  Temp    Pulse 72 01/03/21 1605  Resp 42 01/03/21 1605  SpO2 100 % 01/03/21 1605  Vitals shown include unvalidated device data.  Last Pain:  Vitals:   01/03/21 0836  TempSrc: Oral  PainSc:       Patients Stated Pain Goal: 3 (76/81/15 7262)  Complications: No complications documented.

## 2021-01-04 ENCOUNTER — Encounter (HOSPITAL_COMMUNITY): Payer: Self-pay | Admitting: Vascular Surgery

## 2021-01-04 DIAGNOSIS — S81801A Unspecified open wound, right lower leg, initial encounter: Secondary | ICD-10-CM | POA: Diagnosis not present

## 2021-01-04 LAB — BASIC METABOLIC PANEL
Anion gap: 9 (ref 5–15)
BUN: 19 mg/dL (ref 6–20)
CO2: 24 mmol/L (ref 22–32)
Calcium: 9 mg/dL (ref 8.9–10.3)
Chloride: 100 mmol/L (ref 98–111)
Creatinine, Ser: 1.32 mg/dL — ABNORMAL HIGH (ref 0.61–1.24)
GFR, Estimated: >60 mL/min (ref >60)
Glucose, Bld: 116 mg/dL — ABNORMAL HIGH (ref 70–99)
Potassium: 4.7 mmol/L (ref 3.5–5.1)
Sodium: 133 mmol/L — ABNORMAL LOW (ref 135–145)

## 2021-01-04 LAB — CBC
HCT: 35.1 % — ABNORMAL LOW (ref 39.0–52.0)
Hemoglobin: 11.4 g/dL — ABNORMAL LOW (ref 13.0–17.0)
MCH: 28.1 pg (ref 26.0–34.0)
MCHC: 32.5 g/dL (ref 30.0–36.0)
MCV: 86.7 fL (ref 80.0–100.0)
Platelets: 200 10*3/uL (ref 150–400)
RBC: 4.05 MIL/uL — ABNORMAL LOW (ref 4.22–5.81)
RDW: 14.9 % (ref 11.5–15.5)
WBC: 7.2 10*3/uL (ref 4.0–10.5)
nRBC: 0 % (ref 0.0–0.2)

## 2021-01-04 LAB — LIPID PANEL
Cholesterol: 98 mg/dL (ref 0–200)
HDL: 42 mg/dL (ref >40)
LDL Cholesterol: 28 mg/dL (ref 0–99)
Total CHOL/HDL Ratio: 2.3 RATIO
Triglycerides: 141 mg/dL (ref <150)
VLDL: 28 mg/dL (ref 0–40)

## 2021-01-04 LAB — HEPARIN LEVEL (UNFRACTIONATED): Heparin Unfractionated: 0.25 IU/mL — ABNORMAL LOW (ref 0.30–0.70)

## 2021-01-04 MED ORDER — RIVAROXABAN 2.5 MG PO TABS
2.5000 mg | ORAL_TABLET | Freq: Two times a day (BID) | ORAL | Status: DC
  Administered 2021-01-04 – 2021-01-07 (×6): 2.5 mg via ORAL
  Filled 2021-01-04 (×7): qty 1

## 2021-01-04 MED ORDER — MAGNESIUM OXIDE 400 (241.3 MG) MG PO TABS: 400.0000 mg | ORAL_TABLET | Freq: Two times a day (BID) | ORAL | Status: DC | PRN

## 2021-01-04 MED ORDER — BRIMONIDINE TARTRATE 0.2 % OP SOLN
1.0000 [drp] | Freq: Three times a day (TID) | OPHTHALMIC | Status: DC
  Administered 2021-01-04 – 2021-01-07 (×10): 1 [drp] via OPHTHALMIC
  Filled 2021-01-04: qty 5

## 2021-01-04 MED ORDER — DORZOLAMIDE HCL-TIMOLOL MAL 2-0.5 % OP SOLN
1.0000 [drp] | Freq: Three times a day (TID) | OPHTHALMIC | Status: DC
  Administered 2021-01-04 – 2021-01-07 (×10): 1 [drp] via OPHTHALMIC
  Filled 2021-01-04: qty 10

## 2021-01-04 NOTE — Progress Notes (Signed)
PHARMACIST LIPID MONITORING   Theodore Arellano is a 60 y.o. male admitted on 12/27/2020 with RLE pain concerning for limb ischemia. PMH of PAD. Pharmacy has been consulted to optimize lipid-lowering therapy with the indication of secondary prevention for clinical ASCVD.  Recent Labs:  Lipid Panel (last 6 months):   Lab Results  Component Value Date   CHOL 98 01/04/2021   TRIG 141 01/04/2021   HDL 42 01/04/2021   CHOLHDL 2.3 01/04/2021   VLDL 28 01/04/2021   LDLCALC 28 01/04/2021    Hepatic function panel (last 6 months):   Lab Results  Component Value Date   AST 26 12/27/2020   ALT 18 12/27/2020   ALKPHOS 76 12/27/2020   BILITOT 0.8 12/27/2020    SCr (since admission):   Serum creatinine: 1.32 mg/dL (H) 01/04/21 0640 Estimated creatinine clearance: 55.6 mL/min (A)  Current therapy and lipid therapy tolerance Current lipid-lowering therapy: rosuvastatin 20 mg daily Previous lipid-lowering therapies (if applicable): None Documented or reported allergies or intolerances to lipid-lowering therapies (if applicable): N/A  Assessment:   4/6 Lipid Panel:  TC 98 TG 141 HDL 42 LDL 28   Patient LDL at goal of <70   Plan:    1.Statin intensity (high intensity recommended for all patients regardless of the LDL):  No statin changes. The patient is already on a high intensity statin.  2.Add ezetimibe (if any one of the following):   Not indicated at this time.  3.Refer to lipid clinic:   No  4.Follow-up with:  Primary care provider - [name], MD  5.Follow-up labs after discharge:  No changes in lipid therapy, repeat a lipid panel in one year.       Benna Dunks, PharmD 01/04/2021, 2:31 PM

## 2021-01-04 NOTE — Progress Notes (Signed)
[facility]  Subjective  - Right foot and incisions are painful.   Objective (!) 142/88 80 98.7 F (37.1 C) (Oral) 18 100%  Intake/Output Summary (Last 24 hours) at 01/04/2021 0732 Last data filed at 01/03/2021 1846 Gross per 24 hour  Intake 1637.1 ml  Output 1200 ml  Net 437.1 ml   Right subclavian incision healing well without hematoma Brisk doppler ulnar signal and radial signal Palpable axillary bypass pulse Groin incision healing well, soft without hematoma Lower leg incision healing well, anterior tibial wound with Tegaderm Doppler DP/PT/Peroneal right foot Lungs non labored breathing     Assessment/Planning: POD # 1  #1 right axillary to profundus femoris bypass with 8 mm Dacron graft, #2 right femoral to below-knee popliteal bypass with 6 mm propatent Gore-Tex graft  Good in flow with doppler signals PT/OT for mobility and pain control Elevated Cr trending down now 1.35, good urine Op 1,100 cc last 24 hours. Afebrile without leukocytosis  Roxy Horseman 01/04/2021 7:32 AM --  Laboratory Lab Results: Recent Labs    01/02/21 0441 01/03/21 0208  WBC 6.8 8.8  HGB 13.7 13.2  HCT 43.2 40.6  PLT 222 250   BMET Recent Labs    01/02/21 0942 01/03/21 0208  NA 138 137  K 4.1 3.9  CL 102 103  CO2 24 26  GLUCOSE 94 124*  BUN 23* 23*  CREATININE 1.41* 1.35*  CALCIUM 9.7 9.4    COAG Lab Results  Component Value Date   INR 1.6 (H) 01/12/2020   INR 1.0 05/30/2019   INR 0.98 11/28/2017   No results found for: PTT
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
## 2021-01-04 NOTE — Progress Notes (Signed)
Mobility Specialist: Progress Note   01/04/21 1355  Mobility  Activity Stood at bedside;Transferred:  Bed to chair  Level of Assistance Contact guard assist, steadying assist  Assistive Device Front wheel walker  Distance Ambulated (ft) 2 ft  Mobility Response Tolerated fair  Mobility performed by Mobility specialist  Bed Position Chair  $Mobility charge 1 Mobility   Post-Mobility: 77 HR, 176/79 BP, 100% SpO2  Pt performed 2 sets of 10 leg extensions while sitting EOB. Pt agreeable to stand. Instructed pt to shift his weight from side to side to work on bearing weight on his RLE/ Pt c/o 5/10 pain in RLE and abdomen during session, RN notified. Pt was able to stand for 1-2 minutes before needing to sit. Pt agreeable to transfer to chair with call bell on his table.   [facility] Tametha Banning Mobility Specialist Mobility Specialist Phone: [phone]
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
## 2021-01-04 NOTE — Anesthesia Postprocedure Evaluation (Signed)
Anesthesia Post Note  Patient: Theodore Arellano  Procedure(s) Performed: RIGHT AXILLARY TO PROFUNDA FEMORAL BYPASS (Right Abdomen) RIGHT FEMORAL TO BELOW KNEE POPLITEAL ARTERY BYPASS (Right Leg Upper)     Patient location during evaluation: PACU Anesthesia Type: General Level of consciousness: awake Pain management: pain level controlled Vital Signs Assessment: post-procedure vital signs reviewed and stable Respiratory status: spontaneous breathing Cardiovascular status: stable Postop Assessment: no apparent nausea or vomiting Anesthetic complications: no   No complications documented.  Last Vitals:  Vitals:   01/04/21 1839 01/04/21 1957  BP: (!) 163/81 (!) 150/66  Pulse:  79  Resp: 16 15  Temp:  36.9 C  SpO2: 98% 100%    Last Pain:  Vitals:   01/04/21 2109  TempSrc:   PainSc: 10-Worst pain ever                 Marceline Napierala

## 2021-01-04 NOTE — Progress Notes (Signed)
Family Medicine Teaching Service Daily Progress Note Intern Pager: [phone]  Patient name: Theodore Arellano Medical record number: [id number] Date of birth: [birth year] Age: 60 y.o. Gender: male  Primary Care Provider: [name], MD Consultants: Vascular surgery   Code Status: Full Code  Pt Overview and Major Events to Date:  3/29 admitted  Assessment and Plan: Theodore Arellano is a 60 y.o. male who presentedwith worsening RLE painconcerning for limb ischemia now s/p uncomplicated right axillary-femoral bypass on 4/5. PMH is significant forCAD, T2DM, HLD, COVID-19 infection, cocaineuse,ESRD s/p renal transplant 2011, chronic left lower extremity wound followed by [facility].  PVD Now s/p uncomplicated right axillary-femoral bypass on 4/5. Pain well controlled on current regimen, incision sites look clean. - scheduled Tylenol - PO hydromorphone 4 mg TID prn (home regimen) - ABI today - vascular surgery following, appreciate recommendations  Right lower extremity wound Poorly healing due to peripheral vascular disease.  Has completed a course of antibiotics and is not grossly infected at this time. - s/p vancomycin and metronidazole (3/29-3/30) - s/p amoxicillin-clavulanate (3/30-4/3)  PAD  CAD - ASA 81 mg daily - rosuvastatin 20 mg daily - heparin gtt - WOC consult  T2DM A1c 6.4.  Well controlled without any medications, not on any medications prior to admission.  HTN Adequately controlled. - carvedilol 3.125 mg BID - amlodipine 5 mg, consider increasing if persistently hypertensive  Mobitz I heart block Has had intermittent bradycardia events this admission during sleep.  Beta-blocker dose has been reduced.  Heart rate has been within normal limits. - continue to monitor  Neuropathy - gabapentin 200 mg TID  HLD Chronic, stable. LDL (28) at goal this admission. - continue rosuvastatin 20 mg daily  GERD Chronic, stable. - pantoprazole 40 mg  daily  ESRD S/p renal transplant, not currently on HD.  On multiple immunosuppressants. Tacrolimus level therapeutic. 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  He takes eyedrops at home and has not been receiving it here in the hospital, asking for it to be restarted.  He is unsure which ones that he takes.  Per medication refill records, he takes Cosopt and brimonidine eyedrops so will restart those. - restarted home meds: Cosopt and brimonidine  Gout On colchicine prn. Chronic, stable. - hold colchicine  Substance use UDS positive for cocaine.  Also known tobacco user and alcohol user. - encourage cessation - nicotine patch prn  FEN/GI: heart-healthy diet PPx: heparin gtt  Disposition: cardiac progressive, SNF when medically stable  Subjective:  NAOE.  Patient states surgery went well yesterday.  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  He typically uses eyedrops at home, but he is unsure which ones he takes.  He states he gets these refilled at [facility].  Objective: Temp:  [97.7 F (36.5 C)-98.7 F (37.1 C)] 98.7 F (37.1 C) (04/06 0400) Pulse Rate:  [54-100] 80 (04/06 0400) Resp:  [13-25] 18 (04/06 0400) BP: (132-176)/(79-98) 142/88 (04/06 0400) SpO2:  [95 %-100 %] 100 % (04/06 0400) Arterial Line BP: (140-190)/(77-94) 140/80 (04/06 0400) Physical Exam: General: Sitting up comfortably in bed, NAD Cardiovascular: RRR, no murmurs Respiratory: CTAB Abdomen: soft, non-tender Extremities: WWP, no pain with palpation of bilateral feet Derm: incision sites on  right lower leg, right groin, and right chest without surrounding erythema or purulence  Laboratory: Recent Labs  Lab 01/01/21 0146 01/02/21 0441 01/03/21 0208   WBC 6.2 6.8 8.8  HGB 13.7 13.7 13.2  HCT 41.5 43.2 40.6  PLT 220 222 250   Recent Labs  Lab 01/01/21 0146 01/02/21 0942 01/03/21 0208  NA 135 138 137  K 3.9 4.1 3.9  CL 105 102 103  CO2 24 24 26   BUN 21* 23* 23*  CREATININE 1.33* 1.41* 1.35*  CALCIUM 9.2 9.7 9.4  GLUCOSE 149* 94 124*     Imaging/Diagnostic Tests: No new imaging.  Zola Button, MD 01/04/2021, 7:27 AM PGY-1, [place] Intern pager: [phone], text pages welcome

## 2021-01-05 DIAGNOSIS — S81801A Unspecified open wound, right lower leg, initial encounter: Secondary | ICD-10-CM | POA: Diagnosis not present

## 2021-01-05 DIAGNOSIS — I739 Peripheral vascular disease, unspecified: Secondary | ICD-10-CM | POA: Diagnosis present

## 2021-01-05 DIAGNOSIS — L03115 Cellulitis of right lower limb: Secondary | ICD-10-CM | POA: Diagnosis not present

## 2021-01-05 DIAGNOSIS — E1159 Type 2 diabetes mellitus with other circulatory complications: Secondary | ICD-10-CM

## 2021-01-05 DIAGNOSIS — Z940 Kidney transplant status: Secondary | ICD-10-CM

## 2021-01-05 DIAGNOSIS — I70229 Atherosclerosis of native arteries of extremities with rest pain, unspecified extremity: Secondary | ICD-10-CM | POA: Diagnosis present

## 2021-01-05 DIAGNOSIS — L97909 Non-pressure chronic ulcer of unspecified part of unspecified lower leg with unspecified severity: Secondary | ICD-10-CM | POA: Diagnosis not present

## 2021-01-05 DIAGNOSIS — Z7901 Long term (current) use of anticoagulants: Secondary | ICD-10-CM

## 2021-01-05 LAB — BASIC METABOLIC PANEL
Anion gap: 11 (ref 5–15)
BUN: 17 mg/dL (ref 6–20)
CO2: 23 mmol/L (ref 22–32)
Calcium: 9.4 mg/dL (ref 8.9–10.3)
Chloride: 100 mmol/L (ref 98–111)
Creatinine, Ser: 1.25 mg/dL — ABNORMAL HIGH (ref 0.61–1.24)
GFR, Estimated: >60 mL/min (ref >60)
Glucose, Bld: 95 mg/dL (ref 70–99)
Potassium: 5.2 mmol/L — ABNORMAL HIGH (ref 3.5–5.1)
Sodium: 134 mmol/L — ABNORMAL LOW (ref 135–145)

## 2021-01-05 LAB — CBC
HCT: 35.8 % — ABNORMAL LOW (ref 39.0–52.0)
Hemoglobin: 11.7 g/dL — ABNORMAL LOW (ref 13.0–17.0)
MCH: 28.5 pg (ref 26.0–34.0)
MCHC: 32.7 g/dL (ref 30.0–36.0)
MCV: 87.3 fL (ref 80.0–100.0)
Platelets: 237 10*3/uL (ref 150–400)
RBC: 4.1 MIL/uL — ABNORMAL LOW (ref 4.22–5.81)
RDW: 14.9 % (ref 11.5–15.5)
WBC: 7.7 10*3/uL (ref 4.0–10.5)
nRBC: 0 % (ref 0.0–0.2)

## 2021-01-05 MED ORDER — AMLODIPINE BESYLATE 10 MG PO TABS
10.0000 mg | ORAL_TABLET | Freq: Every day | ORAL | Status: DC
  Administered 2021-01-05 – 2021-01-07 (×3): 10 mg via ORAL
  Filled 2021-01-05 (×3): qty 1

## 2021-01-05 MED ORDER — HYDROMORPHONE HCL 2 MG PO TABS
4.0000 mg | ORAL_TABLET | Freq: Three times a day (TID) | ORAL | Status: DC | PRN
  Administered 2021-01-05 – 2021-01-07 (×4): 4 mg via ORAL
  Filled 2021-01-05 (×6): qty 2

## 2021-01-05 NOTE — TOC Progression Note (Signed)
Transition of Care [facility]) - Progression Note    Patient Details  Name: Theodore Arellano MRN: [id number] Date of Birth: 1960-12-06  Transition of Care [facility]) CM/SW [place], [place] Phone Number: 01/05/2021, 12:59 PM  Clinical Narrative:     CSW spoke with patient by phone. CSW introduced self and explained role. Patient confirms he remains agreeable to SNF placement at [facility]. CSW confirmed with [facility] bed offer and availability.   Patient will insurance authorization and covid test before discharge to SNF.  Thurmond Butts, MSW, LCSW Clinical Social Worker   Expected Discharge Plan: Skilled Nursing Facility Barriers to Discharge: Continued Medical Work up  Expected Discharge Plan and Services Expected Discharge Plan: [facility] In-house Referral: Clinical Social Work     Living arrangements for the past 2 months: Single Family Home                                       Social Determinants of Health (SDOH) Interventions    Readmission Risk Interventions No flowsheet data found.

## 2021-01-05 NOTE — Plan of Care (Signed)
  Problem: Clinical Measurements: Goal: Respiratory complications will improve Outcome: Progressing Goal: Cardiovascular complication will be avoided Outcome: Progressing   

## 2021-01-05 NOTE — Consult Note (Signed)
WOC Nurse Consult Note: Patient receiving care in [facility] 4E10. Reason for Consult: RLE wound related to arterial insufficiency, s/p intervention to extremity. Request for recommendation for care to area Wound type: started as a trauma injury from the patient bumping his shin, determined continued presence from arterial insufficiency. Patient underwent revascularization to extremity during this admission Pressure Injury POA: Yes/No/NA Measurement: 5 cm x 3 cm Wound bed: dried, darkened Drainage (amount, consistency, odor) dried blood around the wound Periwound: intact Dressing procedure/placement/frequency: Apply iodine from the swabsticks or swab pads from clean utility to the right shin wound, and use iodine to clean any dried secretions from around the wound. Allow to air dry.  Thank you for the consult.  Discussed plan of care with the patient.  [place] nurse will not follow at this time.  Please re-consult the [facility] team if needed.  Val Riles, RN, MSN, CWOCN, CNS-BC, pager [phone]

## 2021-01-05 NOTE — Progress Notes (Signed)
Mobility Specialist: Progress Note   01/05/21 1225  Mobility  Activity Ambulated in room  Level of Assistance Minimal assist, patient does 75% or more  Assistive Device Front wheel walker  Distance Ambulated (ft) 6 ft  Mobility Response Tolerated poorly  Mobility performed by Mobility specialist  Bed Position Chair  $Mobility charge 1 Mobility   Pre-Mobility: 78 HR, 100% SpO2 Post-Mobility: 79 HR, 125/65 BP, 100% SpO2  Pt attempted to ambulate in room. Pt walked 13ft and said he needed to go back to the chair. Pt unable to progress gait due to pain level in RLE, no rating given. Pt performed leg extensions on each leg and ankle mobility in the chair after walk.Pt otherwise asx. Encouraged pt to continue working with staff.   [facility] Devanee [place] Mobility Specialist Mobility Specialist Phone: [phone]

## 2021-01-05 NOTE — Progress Notes (Signed)
Noticed patient's O2 sats alarms was ringing out. Checked on patient and sats were in 2's. Check to make sure that the pulse ox was on patient. Patient difficult to arouse. Patient would open his eyes and shut them right after. Had another RN come into to room to help arouse patient. Hard sternal rub done and patient was more alert. During this time, patient's heart rate dipped to 47 but returned to 70's. sats remained in 70-80's.  applied at 4L but has since reduced to 2L. Patient maintaining sats in 90's.  Patient's rhythm would waiver between 1st degree and 2nd degree. Patient asymptomatic during this time. VSS. Notified on call MD. Stat EKG ordered and completed.   Please see note from MD concerning the results of EKG in comparison of previous EKG.   Will continue to monitor and will notify on call for any changes.

## 2021-01-05 NOTE — Care Management Important Message (Signed)
Important Message  Patient Details  Name: AUREN VALDES MRN: [id number] Date of Birth: 1960/10/12   Medicare Important Message Given:  Yes     Shelda Altes 01/05/2021, 11:15 AM

## 2021-01-05 NOTE — Progress Notes (Signed)
Family Medicine Teaching Service Daily Progress Note Intern Pager: [phone]  Patient name: Theodore Arellano Medical record number: [id number] Date of birth: [birth year] Age: 60 y.o. Gender: male  Primary Care Provider: [name], MD Consultants: Vascular surgery   Code Status: Full Code  Pt Overview and Major Events to Date:  3/29 admitted 4/5 right axillary-femoral bypass  Assessment and Plan: JAMEY HARMAN is a 60 y.o. male who presentedwith worsening RLE painconcerning for limb ischemia now s/p uncomplicated right axillary-femoral bypass on 4/5. PMH is significant forCAD, T2DM, HLD, COVID-19 infection, cocaineuse,ESRD s/p renal transplant 2011, chronic left lower extremity wound followed by [facility]. Medically stable for discharge to SNF.  PVD S/p uncomplicated right axillary-femoral bypass on 4/5. Pain well controlled on current regimen, incision sites look clean. - scheduled Tylenol - PO hydromorphone 4 mg TID prn (home regimen) - ABI (can get outpatient if not done here) - vascular surgery following, appreciate recommendations  Right lower extremity wound Poorly healing due to peripheral vascular disease.  Has completed a course of antibiotics and is not grossly infected at this time. - s/p vancomycin and metronidazole (3/29-3/30) - s/p amoxicillin-clavulanate (3/30-4/3) - WOC c/s  PAD  CAD - ASA 81 mg daily - rosuvastatin 20 mg daily - WOC consult  T2DM A1c 6.4.  Well controlled without any medications, not on any medications prior to admission.  HTN Has been hypertensive to the 140s-160s. - carvedilol 3.125 mg BID - increase to amlodipine 10 mg  Mobitz I heart block Has had intermittent bradycardia events due to Mobitz I this admission during sleep.  Beta-blocker dose has been reduced.  Heart rate has been within normal limits. - continue to monitor  Neuropathy - gabapentin 200 mg TID  HLD Chronic, stable. LDL (28) at goal this admission. -  continue rosuvastatin 20 mg daily  GERD Chronic, stable. - pantoprazole 40 mg daily  ESRD S/p renal transplant, not currently on HD.  On multiple immunosuppressants. Tacrolimus level therapeutic. 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 No signs of infection at this time. - continue home Cosopt and brimonidine  Gout On colchicine prn. Chronic, stable. - hold colchicine  Substance use UDS positive for cocaine.  Also known tobacco user and alcohol user. - encourage cessation - nicotine patch prn  FEN/GI: heart-healthy diet PPx: rivaroxaban  Disposition: cardiac progressive, medically stable for SNF when available  Subjective:  NAOE.  Reports pain in his right eye is improved from yesterday after receiving eyedrops.  Still having some pain in his right lower extremity, primarily with movement.  Pain is manageable.  Objective: Temp:  [98.1 F (36.7 C)-98.9 F (37.2 C)] 98.1 F (36.7 C) (04/07 0408) Pulse Rate:  [76-79] 76 (04/07 0408) Resp:  [15-20] 18 (04/07 0408) BP: (129-184)/(66-93) 165/87 (04/07 0408) SpO2:  [98 %-100 %] 100 % (04/07 0408) Physical Exam: General: Sitting up comfortably in bed, NAD Cardiovascular: RRR, no murmurs Respiratory: CTAB Abdomen: soft, non-tender Extremities: WWP, 1+ pitting edema to proximal shins, no pain with palpation of bilateral feet Derm: incision sites on right lower leg, right groin, and right chest without surrounding erythema or purulence  Laboratory: Recent Labs  Lab 01/03/21 0208 01/04/21 0800 01/05/21 0204  WBC 8.8 7.2 7.7  HGB 13.2 11.4* 11.7*  HCT 40.6 35.1* 35.8*  PLT 250 200 237   Recent Labs  Lab 01/03/21 0208 01/04/21  7989 01/05/21 0204  NA 137 133* 134*  K 3.9 4.7 5.2*  CL 103 100 100  CO2 26 24 23   BUN 23* 19 17  CREATININE 1.35* 1.32* 1.25*  CALCIUM  9.4 9.0 9.4  GLUCOSE 124* 116* 95     Imaging/Diagnostic Tests: No new imaging.  Zola Button, MD 01/05/2021, 7:29 AM PGY-1, [place] Intern pager: [phone], text pages welcome

## 2021-01-05 NOTE — Progress Notes (Signed)
Went to see pt after a page by pt's nurse stating he had a brief bradycardic event into the 40s and some difficulty awakening.  When I saw patient he was easily awoken to voice.  HR was in the 70s.  Not having any new chest pain or dyspnea. Ordered new ekg and compared it to most recent prior ekg.  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 Discussed with his nurse. Advised her to page [place] if he worsens or she has any more concerns.     Clemetine Marker, MD PGY3

## 2021-01-05 NOTE — Progress Notes (Addendum)
  Progress Note    01/05/2021 9:51 AM 2 Days Post-Op  Subjective:  No major complaints. Surgical sites sore. Says he has not ambulated and eating breakfast now. Says he did not eat yesterday   Vitals:   01/05/21 0408 01/05/21 0839  BP: (!) 165/87 (!) 148/79  Pulse: 76 77  Resp: 18 16  Temp: 98.1 F (36.7 C) 98.5 F (36.9 C)  SpO2: 100% 99%   Physical Exam: Cardiac: regular  Lungs: non labored Incisions: right subclavian incision is clean, dry and intact. There is swelling present but soft. No hematoma. Right groin incision is clean, dry and intact. Right BK pop incision clean dry and intact. No swelling or hematoma Extremities:  Palpable pulse in Ax-fem bypass graft. BLE well perfused and warm. Doppler right PT/ At/ pero signals. Right anterior leg wound dry Abdomen: soft, non distended Neurologic:alert and oriented  CBC    Component Value Date/Time   WBC 7.7 01/05/2021 0204   RBC 4.10 (L) 01/05/2021 0204   HGB 11.7 (L) 01/05/2021 0204   HCT 35.8 (L) 01/05/2021 0204   PLT 237 01/05/2021 0204   MCV 87.3 01/05/2021 0204   MCH 28.5 01/05/2021 0204   MCHC 32.7 01/05/2021 0204   RDW 14.9 01/05/2021 0204   LYMPHSABS 1.4 12/27/2020 0539   MONOABS 0.6 12/27/2020 0539   EOSABS 0.1 12/27/2020 0539   BASOSABS 0.0 12/27/2020 0539    BMET    Component Value Date/Time   NA 134 (L) 01/05/2021 0204   K 5.2 (H) 01/05/2021 0204   CL 100 01/05/2021 0204   CO2 23 01/05/2021 0204   GLUCOSE 95 01/05/2021 0204   BUN 17 01/05/2021 0204   CREATININE 1.25 (H) 01/05/2021 0204   CALCIUM 9.4 01/05/2021 0204   GFRNONAA >60 01/05/2021 0204   GFRAA >60 01/13/2020 0248    INR    Component Value Date/Time   INR 1.6 (H) 01/12/2020 0656     Intake/Output Summary (Last 24 hours) at 01/05/2021 0951 Last data filed at 01/05/2021 0306 Gross per 24 hour  Intake 1440 ml  Output 2350 ml  Net -910 ml     Assessment/Plan:  60 y.o. male is s/p right axillary to profundus femoris bypass with  8 mm Dacron graft, #2 right femoral to below-knee popliteal bypass with 6 mm propatent Gore-Tex graft 2 Days Post-Op   RLE well perfused and warm with doppler AT/ PT/ pero signals Right subclavian incision with a little swelling but soft Right groin incision and BK pop incisions clean, dry and intact without swelling or hematoma Right anterior shin incision with dry eschar Hemodynamically stable. SCr trending down 1.25 , good UOP Encourage mobilization/ oob PT/ OT recommending SNF Pain control  DVT prophylaxis:  Rivaroxaban   Karoline Caldwell, PA-C [facility] [phone] 01/05/2021 9:51 AM   I have seen and evaluated the patient. I agree with the PA note as documented above.  60 year old male status post right axillary to profunda bypass with a right femoral to below-knee popliteal bypass by Dr. Donnetta Hutching.  Right groin looks good.  He has very brisk Doppler flow in the right foot with dorsalis pedis posterior tibial signals.  All of his incisions look good including axillary and leg incisions.  Watching this pretibial eschar wound.  Marty Heck, MD [facility] [place] Office: [phone]

## 2021-01-06 DIAGNOSIS — I70229 Atherosclerosis of native arteries of extremities with rest pain, unspecified extremity: Secondary | ICD-10-CM | POA: Diagnosis not present

## 2021-01-06 DIAGNOSIS — Z7901 Long term (current) use of anticoagulants: Secondary | ICD-10-CM | POA: Diagnosis not present

## 2021-01-06 DIAGNOSIS — L97909 Non-pressure chronic ulcer of unspecified part of unspecified lower leg with unspecified severity: Secondary | ICD-10-CM | POA: Diagnosis not present

## 2021-01-06 DIAGNOSIS — L03115 Cellulitis of right lower limb: Secondary | ICD-10-CM | POA: Diagnosis not present

## 2021-01-06 LAB — CBC
HCT: 35.1 % — ABNORMAL LOW (ref 39.0–52.0)
Hemoglobin: 11.3 g/dL — ABNORMAL LOW (ref 13.0–17.0)
MCH: 28.2 pg (ref 26.0–34.0)
MCHC: 32.2 g/dL (ref 30.0–36.0)
MCV: 87.5 fL (ref 80.0–100.0)
Platelets: 246 10*3/uL (ref 150–400)
RBC: 4.01 MIL/uL — ABNORMAL LOW (ref 4.22–5.81)
RDW: 14.5 % (ref 11.5–15.5)
WBC: 7.8 10*3/uL (ref 4.0–10.5)
nRBC: 0 % (ref 0.0–0.2)

## 2021-01-06 LAB — BASIC METABOLIC PANEL
Anion gap: 9 (ref 5–15)
BUN: 24 mg/dL — ABNORMAL HIGH (ref 6–20)
CO2: 24 mmol/L (ref 22–32)
Calcium: 9.5 mg/dL (ref 8.9–10.3)
Chloride: 102 mmol/L (ref 98–111)
Creatinine, Ser: 1.39 mg/dL — ABNORMAL HIGH (ref 0.61–1.24)
GFR, Estimated: 58 mL/min — ABNORMAL LOW (ref >60)
Glucose, Bld: 119 mg/dL — ABNORMAL HIGH (ref 70–99)
Potassium: 4.2 mmol/L (ref 3.5–5.1)
Sodium: 135 mmol/L (ref 135–145)

## 2021-01-06 LAB — SARS CORONAVIRUS 2 (TAT 6-24 HRS): SARS Coronavirus 2: NEGATIVE

## 2021-01-06 MED ORDER — COLCHICINE 0.6 MG PO TABS
0.6000 mg | ORAL_TABLET | Freq: Every day | ORAL | Status: DC | PRN
Start: 2021-01-06 — End: 2024-05-02

## 2021-01-06 MED ORDER — GABAPENTIN 100 MG PO CAPS: 200.0000 mg | ORAL_CAPSULE | Freq: Three times a day (TID) | ORAL | Status: DC

## 2021-01-06 MED ORDER — ROSUVASTATIN CALCIUM 20 MG PO TABS: 20.0000 mg | ORAL_TABLET | Freq: Every day | ORAL | Status: DC

## 2021-01-06 MED ORDER — HYDROMORPHONE HCL 4 MG PO TABS: 4.0000 mg | ORAL_TABLET | Freq: Three times a day (TID) | ORAL | 0 refills | Status: AC | PRN

## 2021-01-06 MED ORDER — CARVEDILOL 3.125 MG PO TABS: 3.1250 mg | ORAL_TABLET | Freq: Two times a day (BID) | ORAL | Status: DC

## 2021-01-06 MED ORDER — COLCHICINE 0.6 MG PO TABS
0.6000 mg | ORAL_TABLET | Freq: Once | ORAL | Status: AC
  Administered 2021-01-06: 0.6 mg via ORAL
  Filled 2021-01-06: qty 1

## 2021-01-06 MED ORDER — AMLODIPINE BESYLATE 10 MG PO TABS: 10.0000 mg | ORAL_TABLET | Freq: Every day | ORAL | Status: DC
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
## 2021-01-06 NOTE — Progress Notes (Signed)
Mobility Specialist - Progress Note   01/06/21 1155  Mobility  Activity Ambulated in room  Level of Assistance Standby assist, set-up cues, supervision of patient - no hands on  Assistive Device Front wheel walker  Distance Ambulated (ft) 6 ft  Mobility Response Tolerated fair  Mobility performed by Mobility specialist  $Mobility charge 1 Mobility   Distance limited by R LE "burning" pain. Pt to recliner after walk. VSS throughout.   Pricilla Handler Mobility Specialist Mobility Specialist Phone: [phone]

## 2021-01-06 NOTE — Plan of Care (Signed)
  Problem: Clinical Measurements: Goal: Respiratory complications will improve Outcome: Progressing Goal: Cardiovascular complication will be avoided Outcome: Progressing   Problem: Activity: Goal: Risk for activity intolerance will decrease Outcome: Not Progressing   

## 2021-01-06 NOTE — Progress Notes (Addendum)
[facility]  Subjective  - not feeling well pain issues.   Objective 140/85 65 99.1 F (37.3 C) (Oral) 16 100%  Intake/Output Summary (Last 24 hours) at 01/06/2021 0726 Last data filed at 01/06/2021 0034 Gross per 24 hour  Intake 0 ml  Output 1100 ml  Net -1100 ml   Right subclavian incision is healing Palpable ax-fem bypass Right groin soft incision healing well Doppler PT/AT/peroneal Lungs non labored breathing   Assessment/Planning: POD # 3 60 y.o. male is s/p right axillary to profundus femoris bypass with 8 mm Dacron graft, #2 right femoral to below-knee popliteal bypass with 6 mm propatent Gore-Tex graft  In flow good decreased malodor anterior eschar without change Incisions healing well Urine Op good  PT/ OT recommending SNF Pain control issues  Theodore Arellano 01/06/2021 7:26 AM --  Laboratory Lab Results: Recent Labs    01/04/21 0800 01/05/21 0204  WBC 7.2 7.7  HGB 11.4* 11.7*  HCT 35.1* 35.8*  PLT 200 237   BMET Recent Labs    01/04/21 0640 01/05/21 0204  NA 133* 134*  K 4.7 5.2*  CL 100 100  CO2 24 23  GLUCOSE 116* 95  BUN 19 17  CREATININE 1.32* 1.25*  CALCIUM 9.0 9.4    COAG Lab Results  Component Value Date   INR 1.6 (H) 01/12/2020   INR 1.0 05/30/2019   INR 0.98 11/28/2017   No results found for: PTT   I have examined the patient, reviewed and agree with above.  Excellent Doppler flow to the posterior tibial and anterior tibial level.  Right subclavian, right groin and right popliteal incisions healing.  Eschar remains dry however his pretibial area begins to be separating around the periphery.  Curt Jews, MD 01/06/2021 2:47 PM
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
## 2021-01-06 NOTE — Progress Notes (Signed)
Pt with episode of brady cardia last night and 2nd degree HB this morning.  Currently SR with HR in 60's.  Discussed with MD and AM BB will be held.  Will cont plan of care.
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
## 2021-01-06 NOTE — Progress Notes (Addendum)
Family Medicine Teaching Service Daily Progress Note Intern Pager: [phone]  Patient name: Theodore Arellano Medical record number: [id number] Date of birth: 06/11/1961 Age: 60 y.o. Gender: male  Primary Care Provider: [name], MD Consultants: Vascular surgery   Code Status: Full Code  Pt Overview and Major Events to Date:  3/29 admitted 4/5 right axillary-femoral bypass  Assessment and Plan: Theodore Arellano is a 60 y.o. male who presentedwith worsening RLE painconcerning for limb ischemia now s/p uncomplicated right axillary-femoral bypass on 4/5. PMH is significant forCAD, T2DM, HLD, COVID-19 infection, cocaineuse,ESRD s/p renal transplant 2011, chronic left lower extremity wound followed by [facility]. Medically stable for discharge to SNF.  PVD S/p uncomplicated right axillary-femoral bypass on 4/5. Pain well controlled on current regimen, incision sites look clean. - scheduled Tylenol - PO hydromorphone 4 mg TID prn (home regimen) - ABI (can get outpatient if not done here) - vascular surgery following, appreciate recommendations  Right lower extremity wound Poorly healing due to peripheral vascular disease.  Has completed a course of antibiotics and is not grossly infected at this time. - s/p vancomycin and metronidazole (3/29-3/30) - s/p amoxicillin-clavulanate (3/30-4/3) - WOC c/s  PAD  CAD - ASA 81 mg daily - rosuvastatin 20 mg daily - WOC consult  T2DM A1c 6.4.  Well controlled without any medications, not on any medications prior to admission.  HTN Has been hypertensive to the 140s-160s. - carvedilol 3.125 mg BID - increase to amlodipine 10 mg  Mobitz I heart block Has had intermittent bradycardia events due to Mobitz I this admission during sleep.  Beta-blocker dose has been reduced.  Had another event last night with with transient bradycardia HR as low as 47 which quickly returned to 70s. Also with desat to 70s put on 4L now weaned to room air.   EKG revealed Mobitz 1. - carvedilol dose held this AM - continue to monitor - consider OSA workup outpatient  Neuropathy - gabapentin 200 mg TID  HLD Chronic, stable. LDL (28) at goal this admission. - continue rosuvastatin 20 mg daily  GERD Chronic, stable. - pantoprazole 40 mg daily  ESRD S/p renal transplant, not currently on HD.  On multiple immunosuppressants. Tacrolimus level therapeutic. 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 No signs of infection at this time. - continue home Cosopt and brimonidine  Gout On colchicine prn. Chronic, stable. - hold colchicine  Substance use UDS positive for cocaine.  Also known tobacco user and alcohol user. - encourage cessation - nicotine patch prn  FEN/GI: heart-healthy diet PPx: rivaroxaban  Disposition: cardiac progressive, medically stable for SNF when available  Subjective:  Overnight, patient had another episode of transient bradycardia with heart rate as low as 47, but shortly returned to his 70s.  He also had desaturation to 70s and was put on 4 L O2.  There was some difficulty arousing the patient and had to do a hard sternal rub to awaken him. This morning, he states his right eye pain is now resolved with his home eyedrops.  Still having some pain in his right lower extremity when moving around, largely unchanged from yesterday.  He states his leg pain is controlled with current medications.  Regarding bradycardia last night, patient states he did not have any symptoms and that he was in a deep sleep.  Denies any shortness of breath.  Objective: Temp:  [98.5 F (36.9 C)-99.1 F (37.3 C)] 99.1 F (37.3 C) (04/08 0324) Pulse Rate:  [60-79] 65 (04/08 0324) Resp:  [16-20] 16 (04/08 0324) BP: (133-173)/(64-94) 140/85 (04/08 0324) SpO2:  [99 %-100 %] 100 %  (04/08 0324) Physical Exam: General: Resting comfortably in bed, NAD Cardiovascular: RRR, no murmurs Respiratory: CTAB Abdomen: soft, non-tender Extremities: WWP, 1+ pitting edema to proximal shins, no pain with palpation of bilateral feet Derm: incision sites on right lower leg, right groin, and right chest without surrounding erythema or purulence  Laboratory: Recent Labs  Lab 01/03/21 0208 01/04/21 0800 01/05/21 0204  WBC 8.8 7.2 7.7  HGB 13.2 11.4* 11.7*  HCT 40.6 35.1* 35.8*  PLT 250 200 237   Recent Labs  Lab 01/03/21 0208 01/04/21 0640 01/05/21 0204  NA 137 133* 134*  K 3.9 4.7 5.2*  CL 103 100 100  CO2 26 24 23   BUN 23* 19 17  CREATININE 1.35* 1.32* 1.25*  CALCIUM 9.4 9.0 9.4  GLUCOSE 124* 116* 95     Imaging/Diagnostic Tests: No new imaging.  Zola Button, MD 01/06/2021, 7:28 AM PGY-1, [place] Intern pager: [phone], text pages welcome

## 2021-01-06 NOTE — TOC Transition Note (Deleted)
Transition of Care [facility]) - CM/SW Discharge Note   Patient Details  Name: Theodore Arellano MRN: [id number] Date of Birth: [birth year]  [facility]) CM/SW Contact:  Vinie Sill, LCSW Phone Number: 01/06/2021, 1:37 PM   Clinical Narrative:     Patient will Discharge to: [place]  Discharge Date: 01/06/2021 Family Notified: Karena,daughter Transport XG:XIVH  Per MD patient is ready for discharge. RN, patient, and facility notified of discharge. Discharge Summary sent to facility. RN given number for [phone], Room 1107. Ambulance transport requested for patient.   Clinical Social Worker signing off.  Thurmond Butts, MSW, LCSW Clinical Social Worker    Final next level of care: Skilled Nursing Facility Barriers to Discharge: Continued Medical Work up   Patient Goals and CMS Choice Patient states their goals for this hospitalization and ongoing recovery are:: Patient agreeable to ST rehab before going home CMS Medicare.gov Compare Post Acute Care list provided to:: Patient Choice offered to / list presented to : Patient  Discharge Placement                       Discharge Plan and Services In-house Referral: Clinical Social Work                                   Social Determinants of Health ([place]) Interventions     Readmission Risk Interventions No flowsheet data found.

## 2021-01-06 NOTE — TOC Initial Note (Signed)
Transition of Care [facility]) - Initial/Assessment Note    Patient Details  Name: Theodore Arellano MRN: [id number] Date of Birth: 1961-03-27  Transition of Care [facility]) CM/SW Contact:    Vinie Sill, LCSW Phone Number: 01/06/2021, 2:25 PM  Clinical Narrative:                  CSW spoke with [place] advised SNF can admit patient tomorrow- he would arrive too late this afternoon to admit. Covid remains pending.  Patient has received insurance authorization -reference # [id number] form 04/08-04/12  Thurmond Butts, MSW, LCSW Clinical Social Worker   Expected Discharge Plan: Skilled Nursing Facility Barriers to Discharge: Continued Medical Work up   Patient Goals and CMS Choice Patient states their goals for this hospitalization and ongoing recovery are:: Patient agreeable to ST rehab before going home CMS Medicare.gov Compare Post Acute Care list provided to:: Patient Choice offered to / list presented to : Patient  Expected Discharge Plan and Services Expected Discharge Plan: [place] In-house Referral: Clinical Social Work     Living arrangements for the past 2 months: [place]                                      Prior Living Arrangements/Services Living arrangements for the past 2 months: Single Family Home Lives with:: Self Patient language and need for interpreter reviewed:: Yes Do you feel safe going back to the place where you live?: No   Patient agreeable to [facility] rehab before returning home  Need for Family Participation in Patient Care: No (Comment) Care giver support system in place?: No (comment)   Criminal Activity/Legal Involvement Pertinent to Current Situation/Hospitalization: No - Comment as needed  Activities of Daily Living Home Assistive Devices/Equipment: Cane (specify quad or straight) ADL Screening (condition at time of admission) Patient's cognitive ability adequate to safely complete daily  activities?: Yes Is the patient deaf or have difficulty hearing?: No Does the patient have difficulty seeing, even when wearing glasses/contacts?: No Does the patient have difficulty concentrating, remembering, or making decisions?: No Patient able to express need for assistance with ADLs?: Yes Does the patient have difficulty dressing or bathing?: No Independently performs ADLs?: Yes (appropriate for developmental age) Does the patient have difficulty walking or climbing stairs?: Yes Weakness of Legs: Both Weakness of Arms/Hands: None  Permission Sought/Granted Permission sought to share information with : Family Supports Permission granted to share information with : Yes, Verbal Permission Granted  Share Information with NAME: Garald Balding     Permission granted to share info w Relationship: Daughter  Permission granted to share info w Contact Information: [phone]  Emotional Assessment Appearance:: Appears stated age Attitude/Demeanor/Rapport: Engaged (Patient was sleepy but engaged briefly in conversation with CSW) Affect (typically observed): Appropriate Orientation: : Oriented to Self,Oriented to Place,Oriented to Situation Alcohol / Substance Use: Tobacco Use,Alcohol Use,Illicit Drugs (Per H&P patient smokes, drinks 12 cans of beer per week and does not use illicit drugs) Psych Involvement: No (comment)  Admission diagnosis:  PAD (peripheral artery disease) ([place]) [I73.9] Anticoagulant long-term use [Z79.01] Cellulitis of right lower extremity [L03.115] Non compliance w medication regimen [Z91.14] Leg pain, anterior, right [M79.604] Hypertension, unspecified type [I10] Patient Active Problem List   Diagnosis Date Noted  . Critical lower limb ischemia ([place]) 01/05/2021  . Anticoagulant long-term use   . PAD (peripheral artery disease) ([place])   .  Leg pain, anterior, right 12/27/2020  . Non-healing wound of right lower extremity 12/27/2020  . Cellulitis of right lower  extremity 12/27/2020  . Arterial leg ulcer ([place]) 12/27/2020  . Substance-induced psychotic disorder ([place]) 12/06/2020  . Cellulitis of left lower extremity 01/11/2020  . Sepsis ([place]) 01/11/2020  . DM (diabetes mellitus) type II, controlled, with peripheral vascular disorder ([place]) 01/11/2020  . Chronic pain syndrome 01/11/2020  . Sepsis due to cellulitis ([place]) 01/11/2020  . ESRD (end stage renal disease) ([place]) 01/06/2020  . Acute kidney injury ([place]) 11/08/2019  . COVID-19 virus infection 11/08/2019  . History of DVT (deep vein thrombosis) 11/08/2019  . Hx of CABG 09/04/2019  . Disorder of arteries and arterioles, unspecified ([place]) 07/21/2019  . Wound of left leg 07/21/2019  . Left leg pain 06/28/2019  . Abnormal ECG 06/05/2019  . Mobitz type I Wenckebach atrioventricular block 06/05/2019  . Unstable angina pectoris due to coronary arteriosclerosis ([place]) 05/30/2019  . Left ventricular dysfunction 01/08/2018  . Coronary artery disease involving native coronary artery of native heart without angina pectoris   . Coronary artery disease due to lipid rich plaque   . Elevated troponin   . Tobacco use 09/02/2017  . Chest pain 08/27/2016  . Renal transplant recipient 08/27/2016  . Essential hypertension 08/27/2016  . HLD (hyperlipidemia) 08/27/2016  . Type 2 diabetes mellitus with vascular disease ([place]) 08/27/2016  . Abnormal nuclear stress test 11/02/2015  . CKD (chronic kidney disease) 11/02/2015  . Current smoker 11/02/2015  . Dyslipidemia 11/02/2015  . Immunosuppression ([place]) 09/02/2014  . Rash 09/02/2014  . Left groin pain 06/24/2013  . Anemia 05/21/2012  . Leucopenia 05/21/2012  . Personal history of immunosupression therapy 05/21/2012   PCP:  [name], MD Pharmacy:   [id number] - [place], [address] [place] [address] Phone: [phone] Fax: [phone]     Social Determinants of Health  (SDOH) Interventions    Readmission Risk Interventions No flowsheet data found.

## 2021-01-07 DIAGNOSIS — I10 Essential (primary) hypertension: Secondary | ICD-10-CM

## 2021-01-07 DIAGNOSIS — L97909 Non-pressure chronic ulcer of unspecified part of unspecified lower leg with unspecified severity: Secondary | ICD-10-CM | POA: Diagnosis not present

## 2021-01-07 DIAGNOSIS — L03115 Cellulitis of right lower limb: Secondary | ICD-10-CM | POA: Diagnosis not present

## 2021-01-07 DIAGNOSIS — I70229 Atherosclerosis of native arteries of extremities with rest pain, unspecified extremity: Secondary | ICD-10-CM | POA: Diagnosis not present

## 2021-01-07 LAB — CBC
HCT: 34.8 % — ABNORMAL LOW (ref 39.0–52.0)
Hemoglobin: 11.3 g/dL — ABNORMAL LOW (ref 13.0–17.0)
MCH: 28.4 pg (ref 26.0–34.0)
MCHC: 32.5 g/dL (ref 30.0–36.0)
MCV: 87.4 fL (ref 80.0–100.0)
Platelets: 295 10*3/uL (ref 150–400)
RBC: 3.98 MIL/uL — ABNORMAL LOW (ref 4.22–5.81)
RDW: 14.1 % (ref 11.5–15.5)
WBC: 7.8 10*3/uL (ref 4.0–10.5)
nRBC: 0 % (ref 0.0–0.2)

## 2021-01-07 NOTE — Plan of Care (Signed)
  Problem: Skin Integrity: Goal: Risk for impaired skin integrity will decrease Outcome: Progressing   

## 2021-01-07 NOTE — Progress Notes (Signed)
Family Medicine Teaching Service Daily Progress Note Intern Pager: [phone]  Patient name: Theodore Arellano Medical record number: [id number] Date of birth: [birth year] Age: 60 y.o. Gender: male  Primary Care Provider: [name], MD Consultants: vascular surgery Code Status: full  Pt Overview and Major Events to Date:  3/29 admitted 4/5 right axillary-femoral bypass  Assessment and Plan: Theodore Arellano is a 60 y.o. male who presentedwith worsening RLE painconcerning for limb ischemia now s/p uncomplicated right axillary-femoral bypass on 4/5. PMH is significant forCAD, T2DM, HLD, COVID-19 infection, cocaineuse,ESRD s/p renal transplant 2011, chronic left lower extremity wound followed by [facility]. Medically stable for discharge to SNF.  PVD S/p uncomplicated right axillary-femoral bypass on 4/5. Pain well controlled on current regimen, incision sites look clean. - scheduled Tylenol - PO hydromorphone 4 mg TID prn (home regimen) - ABI (can get outpatient if not done here) - vascular surgery following, appreciate recommendations - aspirin 81mg   Right lower extremity wound Poorly healing due to peripheral vascular disease.  Has completed a course of antibiotics and is not grossly infected at this time. - s/p vancomycin and metronidazole (3/29-3/30) - s/p amoxicillin-clavulanate (3/30-4/3) - WOC c/s  PAD  CAD - ASA 81 mg daily - rosuvastatin 20 mg daily - WOC consult  T2DM A1c 6.4.  Well controlled without any medications, not on any medications prior to admission.  HTN Most recent 176/96. Systolic increased to 481E overnight, but pt asymptomatic.   - carvedilol 3.125 mg BID - increase to amlodipine 10 mg  Mobitz I heart block Has had intermittent bradycardia events due to Mobitz I this admission during sleep. EKG revealed Mobitz 1. Beta-blocker dose has been reduced.  - continue carvedilol  - continue to monitor - consider OSA workup  outpatient  Neuropathy - gabapentin 200 mg TID  HLD Chronic, stable. LDL (28) at goal this admission. - continue rosuvastatin 20 mg daily  GERD Chronic, stable. - pantoprazole 40 mg daily  ESRD S/p renal transplant, not currently on HD.  On multiple immunosuppressants. Tacrolimus level therapeutic. 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 No signs of infection at this time. - continue home Cosopt and brimonidine  Gout On colchicine prn. Chronic, stable. - hold colchicine  Substance use UDS positive for cocaine.  Also known tobacco user and alcohol user. - encourage cessation - nicotine patch prn  FEN/GI: heart-healthy diet PPx: rivaroxaban   Disposition: SNF  Subjective:  Pt states his leg feels somewhat better today. No complaints.  Aware will be transferred to SNF today.    Objective: Temp:  [98 F (36.7 C)-99.2 F (37.3 C)] 98.9 F (37.2 C) (04/09 0351) Pulse Rate:  [68-80] 77 (04/09 0351) Resp:  [16-18] 16 (04/09 0351) BP: (139-186)/(74-92) 186/88 (04/09 0351) SpO2:  [100 %] 100 % (04/09 0351) Physical Exam: General: alert.oriented. no acute distress. Sitting on the side of his hospital bed eating breakfast.   Cardiovascular: normal rate. Regularly irregular rhythm. No murmurs.    Respiratory: LCTAB.   Abdomen: soft, nontender.   Extremities: healing ulcer overlying the right tibia. No signs of infection on incision sites on the right leg or groin.    Laboratory: Recent Labs  Lab 01/05/21 0204 01/06/21 0814 01/07/21 0142  WBC 7.7 7.8 7.8  HGB 11.7* 11.3* 11.3*  HCT 35.8* 35.1* 34.8*  PLT 237 246 295   Recent  Labs  Lab 01/04/21 0640 01/05/21 0204 01/06/21 0814  NA 133* 134* 135  K 4.7 5.2* 4.2  CL 100 100 102  CO2 24 23 24   BUN 19 17 24*  CREATININE 1.32* 1.25* 1.39*   CALCIUM 9.0 9.4 9.5  GLUCOSE 116* 95 119*    Benay Pike, MD 01/07/2021, 5:33 AM PGY-3, [place] Intern pager: [phone], text pages welcome

## 2021-01-07 NOTE — Progress Notes (Signed)
RN called SNF and gave report to [facility], RN. Pt awaiting transportation to come and get pt. Last set of vitals are: Temp: 98.2 BP:138/94 (reported elevated), HR: 78 R: 16. Pt left w/ no oxygen needs on room air. Charge RN took out pt IV and printed D/C papers and went over with pt prior to SNF arrival.

## 2021-01-07 NOTE — Progress Notes (Signed)
   VASCULAR SURGERY ASSESSMENT & PLAN:   4 Days Post-Op Right axillofemoral and right fem below-knee pop bypass with PTFE: His grafts are patent with good Doppler flow in the right foot.  The pretibial wound is stable.  Agree with plans for discharge to a skilled nursing facility today.   SUBJECTIVE:   No complaints.  PHYSICAL EXAM:   Vitals:   01/06/21 1940 01/06/21 2331 01/07/21 0351 01/07/21 0759  BP: (!) 168/92 (!) 151/89 (!) 186/88 (!) 176/96  Pulse: 76 75 77   Resp: 17 16 16 20   Temp: 99.2 F (37.3 C) 98.7 F (37.1 C) 98.9 F (37.2 C) 98.5 F (36.9 C)  TempSrc: Oral Oral Oral Oral  SpO2: 100% 100% 100% 100%  Weight:      Height:       His incisions look fine. Brisk Doppler signals in the right foot.  LABS:   Lab Results  Component Value Date   WBC 7.8 01/07/2021   HGB 11.3 (L) 01/07/2021   HCT 34.8 (L) 01/07/2021   MCV 87.4 01/07/2021   PLT 295 01/07/2021   Lab Results  Component Value Date   CREATININE 1.39 (H) 01/06/2021   Lab Results  Component Value Date   INR 1.6 (H) 01/12/2020    PROBLEM LIST:    Principal Problem:   Critical lower limb ischemia (HCC) Active Problems:   Renal transplant recipient   Essential hypertension   Type 2 diabetes mellitus with vascular disease ([place])   Coronary artery disease due to lipid rich plaque   Coronary artery disease involving native coronary artery of native heart without angina pectoris   Hx of CABG   Leg pain, anterior, right   Non-healing wound of right lower extremity   Cellulitis of right lower extremity   Arterial leg ulcer (HCC)   Anticoagulant long-term use   PAD (peripheral artery disease) (H[facility])   CURRENT MEDS:   . acetaminophen  650 mg Oral Q6H   Or  . acetaminophen  650 mg Rectal Q6H  . amLODipine  10 mg Oral Daily  . aspirin EC  81 mg Oral Daily  . brimonidine  1 drop Right Eye TID  . carvedilol  3.125 mg Oral BID WC  . Chlorhexidine Gluconate Cloth  6 each Topical Q0600  .  dorzolamide-timolol  1 drop Right Eye TID  . gabapentin  200 mg Oral TID  . multivitamin with minerals  1 tablet Oral Daily  . mupirocin ointment  1 application Nasal BID  . mycophenolate  720 mg Oral BID  . pantoprazole  40 mg Oral Daily  . predniSONE  5 mg Oral Daily  . rivaroxaban  2.5 mg Oral Q12H  . rosuvastatin  20 mg Oral q1800  . sodium bicarbonate  1,300 mg Oral BID  . sodium chloride flush  3 mL Intravenous Q12H  . tacrolimus  3 mg Oral q AM   And  . tacrolimus  2 mg Oral QHS  . tamsulosin  0.4 mg Oral QHS    Deitra Mayo Office: [phone] 01/07/2021

## 2021-01-07 NOTE — TOC Transition Note (Signed)
Transition of Care [facility]) - CM/SW Discharge Note   Patient Details  Name: Theodore Arellano MRN: [id number] Date of Birth: [birth year]  Transition of Care [employer]) CM/SW Contact:  Ina Homes, [place] Phone Number: 01/07/2021, 11:44 AM   Clinical Narrative:     Pt d/c to [facility] via [facility]. Attempted to reach family to inform of d/c but no answer.   Call Report: [phone] Room:110  Final next level of care: Skilled Nursing Facility Barriers to Discharge: Barriers Resolved   Patient Goals and CMS Choice Patient states their goals for this hospitalization and ongoing recovery are:: pt agreeable to SNF CMS Medicare.gov Compare Post Acute Care list provided to:: Patient Choice offered to / list presented to : Patient  Discharge Placement              Patient chooses bed at: [facility] Patient to be transferred to facility by: [facility] Name of family member notified: Desiree Patient and family notified of of transfer: 01/07/21  Discharge Plan and Services In-house Referral: Clinical Social Work                                   Social Determinants of Health ([place]) Interventions     Readmission Risk Interventions No flowsheet data found.

## 2021-01-07 NOTE — Progress Notes (Signed)
Mobility Specialist - Progress Note   01/07/21 1148  Mobility  Activity Ambulated in room  Level of Assistance Standby assist, set-up cues, supervision of patient - no hands on  Assistive Device Front wheel walker  Distance Ambulated (ft) 30 ft (15 ft x 2)  Mobility Response Tolerated well  Mobility performed by Mobility specialist  $Mobility charge 1 Mobility   Pt took one seated rest break by bathroom to see if he had to use it. Pt then walked back to bed. He was left sitting on edge of bed with call bell at his side. VSS throughout.   Pricilla Handler Mobility Specialist Mobility Specialist Phone: [phone]

## 2021-01-07 NOTE — TOC Progression Note (Addendum)
Transition of Care [facility]) - Progression Note    Patient Details  Name: [name] MRN: [id number] Date of Birth: [birth year]  Transition of Care [facility]) CM/SW Contact  Ina Homes, [place] Phone Number: 01/07/2021, 10:26 AM  Clinical Narrative:     SW confirmed with receptionist at [facility] [phone] pt able to admit today. SW requested d/c summary from Dr. Jeannine Kitten  Update 1140am Call report: [phone] ask for [facility], RN Room :110  SW called [facility]  SW attempted to update pt daughter [name] [phone] but no answer   Expected Discharge Plan: Skilled Nursing Facility Barriers to Discharge: Barriers Resolved  Expected Discharge Plan and Services Expected Discharge Plan: [place] In-house Referral: Clinical Social Work     Living arrangements for the past 2 months: Single Family Home                                       Social Determinants of Health (SDOH) Interventions    Readmission Risk Interventions No flowsheet data found.

## 2021-01-07 NOTE — Progress Notes (Signed)
[facility] [place]  Subjective  - still having pain   Objective (!) 186/88 77 98.9 F (37.2 C) (Oral) 16 100%  Intake/Output Summary (Last 24 hours) at 01/07/2021 0741 Last data filed at 01/07/2021 2334 Gross per 24 hour  Intake 480 ml  Output 1525 ml  Net -1045 ml   Right subclavian incision is healing Palpable ax-fem bypass Right groin soft incision healing well Doppler PT/AT/peroneal No change in pre tibial wound Lungs non labored breathing   Assessment/Planning: POD # 4 60 y.o.maleis s/p right axillary to profundus femoris bypass with 8 mm Dacron graft, #2 right femoral to below-knee popliteal bypass with 6 mm propatent Gore-Tex graft  Plan for discharge to SNF today Stable from a vascular point of point of view  Roxy Horseman 01/07/2021 7:41 AM --  Laboratory Lab Results: Recent Labs    01/06/21 0814 01/07/21 0142  WBC 7.8 7.8  HGB 11.3* 11.3*  HCT 35.1* 34.8*  PLT 246 295   BMET Recent Labs    01/05/21 0204 01/06/21 0814  NA 134* 135  K 5.2* 4.2  CL 100 102  CO2 23 24  GLUCOSE 95 119*  BUN 17 24*  CREATININE 1.25* 1.39*  CALCIUM 9.4 9.5    COAG Lab Results  Component Value Date   INR 1.6 (H) 01/12/2020   INR 1.0 05/30/2019   INR 0.98 11/28/2017   No results found for: PTT

## 2021-02-07 DIAGNOSIS — Z7409 Other reduced mobility: Secondary | ICD-10-CM | POA: Insufficient documentation

## 2021-02-08 ENCOUNTER — Other Ambulatory Visit: Payer: Self-pay

## 2021-02-08 ENCOUNTER — Ambulatory Visit (INDEPENDENT_AMBULATORY_CARE_PROVIDER_SITE_OTHER): Payer: Medicare Other | Admitting: Physician Assistant

## 2021-02-08 VITALS — BP 166/89 | HR 64 | Temp 98.3°F | Resp 20 | Ht 67.0 in

## 2021-02-08 DIAGNOSIS — I739 Peripheral vascular disease, unspecified: Secondary | ICD-10-CM

## 2021-02-08 NOTE — Progress Notes (Signed)
POST OPERATIVE OFFICE NOTE    CC:  F/u for surgery  HPI:  This is a 60 y.o. male who is s/p right axillary to profundus femoris bypass with Dacron graft, right femoral to below-knee popliteal bypass with propatent Gore-Tex graft on January 03, 2021 by Dr. Donnetta Hutching.  This was done secondary to nonhealing wound of the right pretibial area.  Prior to surgery, he had undergone abdominal aortogram Dr. Carlis Abbott on December 29, 2020 with known history of depressed ABI.  He currently resides in rehab facility and states he is not walking much.  His primary complaint today is right foot and ankle swelling and pain.  He says he feels as though this is a gout flare but the facility is not giving him his colchicine.  He does not endorse right calf pain with exercise or rest pain.  His prior surgical history includes left common femoral endarterectomy and subsequent left common femoral to posterior tibial artery bypass and multiple additional endovascular interventions at [facility].  He is status post kidney transplant 2011.  History of coronary artery bypass graft and is on Xarelto.  He is compliant with aspirin and statin. He smokes approximately 1/2 pack of cigarettes daily He has a history of hypertension and diabetes mellitus, type II. Allergies  Allergen Reactions  . Percocet [Oxycodone-Acetaminophen] Hives and Itching  . Vicodin [Hydrocodone-Acetaminophen] Hives and Itching  . Shellfish Allergy Hives  . Chocolate Flavor Hives  . Chocolate Hives  . Tomato Hives    Current Outpatient Medications  Medication Sig Dispense Refill  . acetaminophen (TYLENOL) 500 MG tablet Take 500 mg by mouth every 6 (six) hours as needed for moderate pain or headache.    Marland Kitchen amLODipine (NORVASC) 10 MG tablet Take 1 tablet (10 mg total) by mouth daily.    Marland Kitchen aspirin EC 81 MG tablet Take 81 mg by mouth daily.    . blood glucose meter kit and supplies KIT Dispense based on patient and insurance preference. Use up to four times  daily as directed. (FOR ICD-9 250.00, 250.01). 1 each 0  . brimonidine (ALPHAGAN) 0.2 % ophthalmic solution Place 1 drop into the right eye 3 (three) times daily.    . carvedilol (COREG) 3.125 MG tablet Take 1 tablet (3.125 mg total) by mouth 2 (two) times daily with a meal.    . colchicine 0.6 MG tablet Take 1 tablet (0.6 mg total) by mouth daily as needed.    . dorzolamide-timolol (COSOPT) 22.3-6.8 MG/ML ophthalmic solution Place 1 drop into the right eye 3 (three) times daily.    Marland Kitchen gabapentin (NEURONTIN) 100 MG capsule Take 2 capsules (200 mg total) by mouth 3 (three) times daily.    . hydrOXYzine (VISTARIL) 25 MG capsule Take 25 mg by mouth 3 (three) times daily as needed for anxiety.    . magnesium oxide (MAG-OX) 400 MG tablet Take 1 tablet by mouth 2 (two) times daily.    . mycophenolate (MYFORTIC) 360 MG TBEC EC tablet Take 720 mg by mouth 2 (two) times daily.    Marland Kitchen NARCAN 4 MG/0.1ML LIQD nasal spray kit Place 1 spray into the nose as needed (overdose).    . pantoprazole (PROTONIX) 40 MG tablet Take 40 mg by mouth daily.    . predniSONE (DELTASONE) 5 MG tablet Take 5 mg by mouth daily.    . rivaroxaban (XARELTO) 2.5 MG TABS tablet Take 1 tablet by mouth 2 (two) times daily.    . rosuvastatin (CRESTOR) 20 MG tablet Take  1 tablet (20 mg total) by mouth daily at 6 PM.    . sodium bicarbonate 650 MG tablet Take 1,300 mg by mouth 2 (two) times daily.    . tacrolimus (PROGRAF) 1 MG capsule Take 2-3 mg by mouth See admin instructions. Take 3mg  in the morning and 2mg  at night, 12 hours apart.    . tamsulosin (FLOMAX) 0.4 MG CAPS capsule Take 0.4 mg by mouth at bedtime.     No current facility-administered medications for this visit.     ROS:  See HPI  BP (!) 166/89 (BP Location: Left Arm, Patient Position: Sitting, Cuff Size: Normal)   Pulse 64   Temp 98.3 F (36.8 C) (Temporal)   Resp 20   Ht 5\' 7"  (1.702 m)   SpO2 100%   BMI 24.83 kg/m   Physical Exam:  General appearance: WD, WN  in NAD Cardiac: Rate and rhythm are regular Respiratory: Unlabored Incision: Right upper anterior chest and bilateral groin incisions are well-healed Extremities: Left lower extremity: 2-3+ left femoral pulse.  Foot is warm with intact motor function and sensation.  Skin appearance is much improved.  Old vein harvest wound continues to heal. Right lower extremity: 2+ right femoral pulse, 1+ right dorsalis pedis and posterior tibial pulses.  His pretibial wound has robust granulation and measures approximately 8 cm x 5 cm. Neuro: Alert and oriented x4   Right pre-tibial area    Assessment/Plan:  This is a 60 y.o. male who is s/p: right axillary to profundus femoris bypass with Dacron graft, right femoral to below-knee popliteal bypass with propatent Gore-Tex graft.  He has  palpable right pedal pulses and his right pretibial wound is healing.  I treated the exuberant granulation tissue with silver nitrate.  Continue wound care.  I do not think his left foot and ankle pain are vascular in nature.  Follow-up in 9 months with arterial duplex study of axillary femoral bypass and right lower extremity bypass graft.  Encourage smoking cessation.  Continue aspirin and statin.  Risa Grill, PA-C [facility] [phone]  Clinic MD:  Dr. Scot Dock

## 2021-02-09 ENCOUNTER — Other Ambulatory Visit: Payer: Self-pay

## 2021-02-09 DIAGNOSIS — I739 Peripheral vascular disease, unspecified: Secondary | ICD-10-CM

## 2021-10-01 ENCOUNTER — Ambulatory Visit (HOSPITAL_COMMUNITY)
Admission: EM | Admit: 2021-10-01 | Discharge: 2021-10-01 | Disposition: A | Discharge status: Home / Self Care | Payer: Medicare Other | Attending: Family Medicine | Admitting: Family Medicine

## 2021-10-01 ENCOUNTER — Other Ambulatory Visit: Payer: Self-pay

## 2021-10-01 ENCOUNTER — Encounter (HOSPITAL_COMMUNITY): Payer: Self-pay | Admitting: *Deleted

## 2021-10-01 DIAGNOSIS — S81801A Unspecified open wound, right lower leg, initial encounter: Secondary | ICD-10-CM | POA: Diagnosis not present

## 2021-10-01 DIAGNOSIS — S91001A Unspecified open wound, right ankle, initial encounter: Secondary | ICD-10-CM

## 2021-10-01 DIAGNOSIS — S81001A Unspecified open wound, right knee, initial encounter: Secondary | ICD-10-CM | POA: Diagnosis not present

## 2021-10-01 NOTE — ED Triage Notes (Signed)
T reports he missed his wound care appt. And was instructed to come to [facility] to have dsy changed.

## 2021-10-01 NOTE — ED Provider Notes (Signed)
[place]    CSN: [id number] Arrival date & time: 10/01/21  1740      History   Chief Complaint No chief complaint on file.   HPI Theodore Arellano is a 61 y.o. male.   HPI Here for bandage change. Has been going to wound care for chronic leg ulceration/wound, and he missed his appt on 12/30. States that is the only one he has missed.   On review of epic he has not had a wound care visit since 12/15, so he has missed 2 treatments, as would have been due around 12/22 and 12/29 or 12/30.   Past Medical History:  Diagnosis Date   Anxiety    Arthritis    "lower back" (11/28/2017)   CAD (coronary artery disease)    Chronic lower back pain    H/O immunosuppressive therapy    chronic/notes 11/28/2017   History of gout    "before kidney transport" (11/28/2017)   Hyperlipidemia    Hypertension    Kidney disease    s/p kidney transplant 2011; "not on dialysis now" (11/28/2017)   Tobacco use     Patient Active Problem List   Diagnosis Date Noted   Impaired functional mobility, balance, gait, and endurance 02/07/2021   Hypertension    Critical lower limb ischemia ([place]) 01/05/2021   Anticoagulant long-term use    PAD (peripheral artery disease) (HCC)    Leg pain, anterior, right 12/27/2020   Non-healing wound of right lower extremity 12/27/2020   Cellulitis of right lower extremity 12/27/2020   Arterial leg ulcer ([place]) 12/27/2020   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   Osteomyelitis of left fibula ([place]) 01/13/2020   Cellulitis of left lower extremity 01/11/2020   Sepsis ([place]) 01/11/2020   DM (diabetes mellitus) type II, controlled, with peripheral vascular disorder ([place]) 01/11/2020   Chronic pain syndrome 01/11/2020   Sepsis due to cellulitis ([place]) 01/11/2020   ESRD (end stage renal disease) ([place]) 01/06/2020   Acute kidney injury ([place]) 11/08/2019   COVID-19 virus infection 11/08/2019   History of DVT (deep vein thrombosis) 11/08/2019   Hx of  CABG 09/04/2019   Disorder of arteries and arterioles, unspecified ([place]) 07/21/2019   Wound of left leg 07/21/2019   Left leg pain 06/28/2019   Abnormal ECG 06/05/2019   Mobitz type I Wenckebach atrioventricular block 06/05/2019   Unstable angina pectoris due to coronary arteriosclerosis ([place]) 05/30/2019   Left ventricular dysfunction 01/08/2018   Coronary artery disease involving native coronary artery of native heart without angina pectoris    Coronary artery disease due to lipid rich plaque    Elevated troponin    Tobacco use 09/02/2017   Chest pain 08/27/2016   Renal transplant recipient 08/27/2016   Essential hypertension 08/27/2016   HLD (hyperlipidemia) 08/27/2016   Type 2 diabetes mellitus with vascular disease ([place]) 08/27/2016   Abnormal nuclear stress test 11/02/2015   CKD (chronic kidney disease) 11/02/2015   Current smoker 11/02/2015   Dyslipidemia 11/02/2015   Immunosuppression ([place]) 09/02/2014   Rash 09/02/2014   Left groin pain 06/24/2013   Anemia 05/21/2012   Leucopenia 05/21/2012   Personal history of immunosupression therapy 05/21/2012    Past Surgical History:  Procedure Laterality Date   ABDOMINAL AORTOGRAM W/LOWER EXTREMITY Bilateral 12/29/2020   Procedure: ABDOMINAL AORTOGRAM W/LOWER EXTREMITY;  Surgeon: Marty Heck, MD;  Location: [facility];  Service: Cardiovascular;  Laterality: Bilateral;   ARTERIOVENOUS GRAFT PLACEMENT Right 04/2007   Archie Endo 02/01/2011   AV  FISTULA PLACEMENT Left 12/20/2005; 12/2006   Archie Endo 02/13/2011; Archie Endo 02/13/2011   AXILLARY-FEMORAL BYPASS GRAFT Right 01/03/2021   Procedure: RIGHT AXILLARY TO PROFUNDA FEMORAL BYPASS;  Surgeon: Rosetta Posner, MD;  [facility];  Service: Vascular;  Laterality: Right;   CARDIAC CATHETERIZATION     FEMORAL-POPLITEAL BYPASS GRAFT Right 01/03/2021   Procedure: RIGHT FEMORAL TO BELOW KNEE POPLITEAL ARTERY BYPASS;  Surgeon: Rosetta Posner, MD;  Location: [facility];  Service: Vascular;   Laterality: Right;   HD access procedures     INGUINAL HERNIA REPAIR Left    KIDNEY TRANSPLANT  2011   LEFT HEART CATH AND CORONARY ANGIOGRAPHY N/A 11/29/2017   Procedure: LEFT HEART CATH AND CORONARY ANGIOGRAPHY;  Surgeon: Leonie Man, MD;  Location: [facility];  Service: Cardiovascular;  Laterality: N/A;   LEFT HEART CATH AND CORONARY ANGIOGRAPHY N/A 06/01/2019   Procedure: LEFT HEART CATH AND CORONARY ANGIOGRAPHY;  Surgeon: Burnell Blanks, MD;  Location: [place];  Service: Cardiovascular;  Laterality: N/A;   LEFT HEART CATHETERIZATION WITH CORONARY ANGIOGRAM  03/11/2012   Procedure: LEFT HEART CATHETERIZATION WITH CORONARY ANGIOGRAM;  Surgeon: Jolaine Artist, MD;  Location: [facility] CATH LAB;  Service: Cardiovascular;;   RIGHT HEART CATHETERIZATION N/A 03/11/2012   Procedure: RIGHT HEART CATH;  Surgeon: Jolaine Artist, MD;  Location: [facility] CATH LAB;  Service: Cardiovascular;  Laterality: N/A;   THROMBECTOMY Right 12/2007   Archie Endo 02/01/2011   THROMBECTOMY / ARTERIOVENOUS GRAFT REVISION Left 12/2006   Archie Endo 02/13/2011   THROMBECTOMY / ARTERIOVENOUS GRAFT REVISION Right 07/2007; 10/2007;01/2008;   Archie Endo 01/31/2011; Archie Endo 02/01/2011; Archie Endo 01/31/2011   THROMBECTOMY AND REVISION OF ARTERIOVENTOUS (AV) GORETEX  GRAFT  03/2007 X 2   Archie Endo 02/01/2011       Home Medications    Prior to Admission medications   Medication Sig Start Date End Date Taking? Authorizing Provider  acetaminophen (TYLENOL) 500 MG tablet Take 500 mg by mouth every 6 (six) hours as needed for moderate pain or headache.    [provider]  amLODipine (NORVASC) 10 MG tablet Take 1 tablet (10 mg total) by mouth daily. 01/07/21   Martyn Malay, MD  aspirin EC 81 MG tablet Take 81 mg by mouth daily.    [provider]  blood glucose meter kit and supplies KIT Dispense based on patient and insurance preference. Use up to four times daily as directed. (FOR ICD-9 250.00, 250.01). 11/11/19    Nita Sells, MD  brimonidine (ALPHAGAN) 0.2 % ophthalmic solution Place 1 drop into the right eye 3 (three) times daily.    [provider]  carvedilol (COREG) 3.125 MG tablet Take 1 tablet (3.125 mg total) by mouth 2 (two) times daily with a meal. 01/06/21   Martyn Malay, MD  colchicine 0.6 MG tablet Take 1 tablet (0.6 mg total) by mouth daily as needed. 01/06/21   Martyn Malay, MD  dorzolamide-timolol (COSOPT) 22.3-6.8 MG/ML ophthalmic solution Place 1 drop into the right eye 3 (three) times daily.    [provider]  gabapentin (NEURONTIN) 100 MG capsule Take 2 capsules (200 mg total) by mouth 3 (three) times daily. 01/06/21   Martyn Malay, MD  hydrOXYzine (VISTARIL) 25 MG capsule Take 25 mg by mouth 3 (three) times daily as needed for anxiety. 11/15/20   [provider]  mycophenolate (MYFORTIC) 360 MG TBEC EC tablet Take 720 mg by mouth 2 (two) times daily.    [provider]  NARCAN 4 MG/0.1ML LIQD nasal spray kit Place 1 spray into the nose as needed (overdose). 12/02/19   [provider]  pantoprazole (PROTONIX) 40 MG tablet Take 40 mg by mouth daily. 11/02/19   [provider]  predniSONE (DELTASONE) 5 MG tablet Take 5 mg by mouth daily. 12/07/19   [provider]  rivaroxaban (XARELTO) 2.5 MG TABS tablet Take 1 tablet by mouth 2 (two) times daily. 06/10/20   [provider]  rosuvastatin (CRESTOR) 20 MG tablet Take 1 tablet (20 mg total) by mouth daily at 6 PM. 01/06/21   Martyn Malay, MD  sodium bicarbonate 650 MG tablet Take 1,300 mg by mouth 2 (two) times daily.    [provider]  tacrolimus (PROGRAF) 1 MG capsule Take 2-3 mg by mouth See admin instructions. Take 3mg  in the morning and 2mg  at night, 12 hours apart.    [provider]  tamsulosin (FLOMAX) 0.4 MG CAPS capsule Take 0.4 mg by mouth at bedtime. 08/28/19   [provider]    Family History Family History  Problem  Relation Age of Onset   Hypertension Father    Diabetes Mother     Social History Social History   Tobacco Use   Smoking status: Every Day    Packs/day: 0.50    Years: 40.00    Pack years: 20.00    Types: Cigarettes   Smokeless tobacco: Never  Vaping Use   Vaping Use: Never used  Substance Use Topics   Alcohol use: Yes    Alcohol/week: 12.0 standard drinks    Types: 12 Cans of beer per week   Drug use: No     Allergies   Percocet [oxycodone-acetaminophen], Vicodin [hydrocodone-acetaminophen], Shellfish allergy, Chocolate flavor, Chocolate, and Tomato   Review of Systems Review of Systems   Physical Exam Triage Vital Signs ED Triage Vitals  Enc Vitals Group     BP 10/01/21 1752 138/86     Pulse Rate 10/01/21 1752 79     Resp 10/01/21 1752 20     Temp 10/01/21 1752 98.2 F (36.8 C)     Temp src --      SpO2 10/01/21 1752 96 %     Weight --      Height --      Head Circumference --      Peak Flow --      Pain Score 10/01/21 1750 0     Pain Loc --      Pain Edu? --      Excl. in [place]? --    No data found.  Updated Vital Signs BP 138/86    Pulse 79    Temp 98.2 F (36.8 C)    Resp 20    SpO2 96%   Visual Acuity Right Eye Distance:   Left Eye Distance:   Bilateral Distance:    Right Eye Near:   Left Eye Near:    Bilateral Near:     Physical Exam Vitals reviewed.  Cardiovascular:     Rate and Rhythm: Normal rate and regular rhythm.  Skin:    Coloration: Skin is not jaundiced or pale.     Comments: Bandage scissors used to remove his unna boot. The bottom of his unna boot was discolored gray. He has diffuse gray eschar over the entire lower leg. Saline used to soften his bandages, and then used to irrigate the wounds a little. Hunks of gray eschar came off with the bandages around his  ankle--leaving open wound with gray gooey base. The open wounds were approx 4 x 6 cm on anterior, medial and posterior ankle. Vaseline impregnated gauze applied and then  wrapped with kerlex and some coban.   Neurological:     Mental Status: He is alert and oriented to person, place, and time.  Psychiatric:        Behavior: Behavior normal.     UC Treatments / Results  Labs (all labs ordered are listed, but only abnormal results are displayed) Labs Reviewed - No data to display  EKG   Radiology No results found.  Procedures Procedures (including critical care time)  Medications Ordered in UC Medications - No data to display  Initial Impression / Assessment and Plan / UC Course  I have reviewed the triage vital signs and the nursing notes.  Pertinent labs & imaging results that were available during my care of the patient were reviewed by me and considered in my medical decision making (see chart for details).     Repeatedly discussed with him that this didn't take the place of his wound care treatment, and that he needed to call wound care tomorrow. To the ER if fever, chills, vomiting. Final Clinical Impressions(s) / UC Diagnoses   Final diagnoses:  Open wound of right knee, leg, and ankle with complication, initial encounter     Discharge Instructions      Call wound care tomorrow to get seen sooner than your usual appointment.  This bandage change did not really do for you what wound care does; it's just a fresh bandage. Go to the ER if having fever, chills, vomiting    ED Prescriptions   None    PDMP not reviewed this encounter.   Barrett Henle, MD 10/01/21 [phone]

## 2021-10-01 NOTE — ED Notes (Signed)
DSY to rt leg changed by Provider

## 2021-10-01 NOTE — Discharge Instructions (Signed)
Call wound care tomorrow to get seen sooner than your usual appointment.  This bandage change did not really do for you what wound care does; it's just a fresh bandage. Go to the ER if having fever, chills, vomiting

## 2022-01-30 ENCOUNTER — Other Ambulatory Visit: Payer: Self-pay | Admitting: *Deleted

## 2022-01-30 DIAGNOSIS — I739 Peripheral vascular disease, unspecified: Secondary | ICD-10-CM

## 2022-02-08 NOTE — Progress Notes (Signed)
?HISTORY AND PHYSICAL  ? ? ? ?CC:  follow up. ?Requesting Provider:  [name], * ? ?HPI: This is a 61 y.o. male who is here today for follow up for PAD.  Pt has hx of  right axillary to profundus femoris bypass with Dacron graft, right femoral to below-knee popliteal bypass with propatent Gore-Tex graft on January 03, 2021 by Dr. Donnetta Hutching.  This was done secondary to nonhealing wound of the right pretibial area.  Prior to surgery, he had undergone abdominal aortogram Dr. Carlis Abbott on December 29, 2020 with known history of depressed ABI. ? ?He also has hx of left common femoral endarterectomy and subsequent left common femoral to posterior tibial artery bypass and multiple additional endovascular interventions at [facility].  He is status post kidney transplant 2011.  History of coronary artery bypass graft and is on Xarelto. ? ?Pt was last seen 02/08/2021 and at that time, he was residing in rehab facility and was not walking much.  He was having some right foot and ankle swelling and pain.  He felt as though it was gout but the facility was not giving him his colchicine.  He was not having any right calf pain with exercise or having any rest pain.  He had right palpable pedal pulses and the wound on the pre tibial area was healing.  It was not felt his left ankle pain and swelling was vascular in nature.  He was encouraged to quit smoking.  ? ?The pt returns today for follow up.  He states he is doing ok.  He does not have any claudication (except when it is cloudy outside), denies rest pain or foot wounds.  He is getting weekly unna boots for lower extremity swelling/wounds.  ? ?The pt is on a statin for cholesterol management.    ?The pt is on an aspirin.    Other AC:  Xarelto ?The pt is on CCB, BB for hypertension.  ?The pt does not have diabetes. ?Tobacco hx:  current-says he has bought his last pack of cigarettes ? ? ?Past Medical History:  ?Diagnosis Date  ? Anxiety   ? Arthritis   ? "lower back"  (11/28/2017)  ? CAD (coronary artery disease)   ? Chronic lower back pain   ? H/O immunosuppressive therapy   ? chronic/notes 11/28/2017  ? History of gout   ? "before kidney transport" (11/28/2017)  ? Hyperlipidemia   ? Hypertension   ? Kidney disease   ? s/p kidney transplant 2011; "not on dialysis now" (11/28/2017)  ? Tobacco use   ? ? ?Past Surgical History:  ?Procedure Laterality Date  ? ABDOMINAL AORTOGRAM W/LOWER EXTREMITY Bilateral 12/29/2020  ? Procedure: ABDOMINAL AORTOGRAM W/LOWER EXTREMITY;  Surgeon: Marty Heck, MD;  Location: [place] [facility];  Service: Cardiovascular;  Laterality: Bilateral;  ? ARTERIOVENOUS GRAFT PLACEMENT Right 04/2007  ? Archie Endo 02/01/2011  ? AV FISTULA PLACEMENT Left 12/20/2005; 12/2006  ? Archie Endo 02/13/2011; Archie Endo 02/13/2011  ? AXILLARY-FEMORAL BYPASS GRAFT Right 01/03/2021  ? Procedure: RIGHT AXILLARY TO PROFUNDA FEMORAL BYPASS;  Surgeon: Rosetta Posner, MD;  Location: [facility];  Service: Vascular;  Laterality: Right;  ? CARDIAC CATHETERIZATION    ? FEMORAL-POPLITEAL BYPASS GRAFT Right 01/03/2021  ? Procedure: RIGHT FEMORAL TO BELOW KNEE POPLITEAL ARTERY BYPASS;  Surgeon: Rosetta Posner, MD;  Location: [facility];  Service: Vascular;  Laterality: Right;  ? HD access procedures    ? INGUINAL HERNIA REPAIR Left   ? KIDNEY TRANSPLANT  2011  ?  LEFT HEART CATH AND CORONARY ANGIOGRAPHY N/A 11/29/2017  ? Procedure: LEFT HEART CATH AND CORONARY ANGIOGRAPHY;  Surgeon: Leonie Man, MD;  Location: [place] [facility];  Service: Cardiovascular;  Laterality: N/A;  ? LEFT HEART CATH AND CORONARY ANGIOGRAPHY N/A 06/01/2019  ? Procedure: LEFT HEART CATH AND CORONARY ANGIOGRAPHY;  Surgeon: Burnell Blanks, MD;  Location: [place] [facility];  Service: Cardiovascular;  Laterality: N/A;  ? LEFT HEART CATHETERIZATION WITH CORONARY ANGIOGRAM  03/11/2012  ? Procedure: LEFT HEART CATHETERIZATION WITH CORONARY ANGIOGRAM;  Surgeon: Jolaine Artist, MD;  Location: [facility] C[facility] LAB;  Service: Cardiovascular;;  ?  RIGHT HEART CATHETERIZATION N/A 03/11/2012  ? Procedure: RIGHT HEART CATH;  Surgeon: Jolaine Artist, MD;  Location[facility] C[facility] LAB;  Service: Cardiovascular;  Laterality: N/A;  ? THROMBECTOMY Right 12/2007  ? Archie Endo 02/01/2011  ? THROMBECTOMY / ARTERIOVENOUS GRAFT REVISION Left 12/2006  ? Archie Endo 02/13/2011  ? THROMBECTOMY / ARTERIOVENOUS GRAFT REVISION Right 07/2007; 10/2007;01/2008;  ? Archie Endo 01/31/2011; Archie Endo 02/01/2011; Archie Endo 01/31/2011  ? THROMBECTOMY AND REVISION OF ARTERIOVENTOUS (AV) GORETEX  GRAFT  03/2007 X 2  ? Archie Endo 02/01/2011  ? ? ?Allergies  ?Allergen Reactions  ? Percocet [Oxycodone-Acetaminophen] Hives and Itching  ? Vicodin [Hydrocodone-Acetaminophen] Hives and Itching  ? Shellfish Allergy Hives  ? Chocolate Flavor Hives  ? Chocolate Hives  ? Tomato Hives  ? ? ?Current Outpatient Medications  ?Medication Sig Dispense Refill  ? acetaminophen (TYLENOL) 500 MG tablet Take 500 mg by mouth every 6 (six) hours as needed for moderate pain or headache.    ? amLODipine (NORVASC) 10 MG tablet Take 1 tablet (10 mg total) by mouth daily.    ? aspirin EC 81 MG tablet Take 81 mg by mouth daily.    ? blood glucose meter kit and supplies KIT Dispense based on patient and insurance preference. Use up to four times daily as directed. (FOR ICD-9 250.00, 250.01). 1 each 0  ? brimonidine (ALPHAGAN) 0.2 % ophthalmic solution Place 1 drop into the right eye 3 (three) times daily.    ? carvedilol (COREG) 3.125 MG tablet Take 1 tablet (3.125 mg total) by mouth 2 (two) times daily with a meal.    ? colchicine 0.6 MG tablet Take 1 tablet (0.6 mg total) by mouth daily as needed.    ? dorzolamide-timolol (COSOPT) 22.3-6.8 MG/ML ophthalmic solution Place 1 drop into the right eye 3 (three) times daily.    ? gabapentin (NEURONTIN) 100 MG capsule Take 2 capsules (200 mg total) by mouth 3 (three) times daily.    ? hydrOXYzine (VISTARIL) 25 MG capsule Take 25 mg by mouth 3 (three) times daily as needed for anxiety.    ? mycophenolate  (MYFORTIC) 360 MG TBEC EC tablet Take 720 mg by mouth 2 (two) times daily.    ? NARCAN 4 MG/0.1ML LIQD nasal spray kit Place 1 spray into the nose as needed (overdose).    ? pantoprazole (PROTONIX) 40 MG tablet Take 40 mg by mouth daily.    ? predniSONE (DELTASONE) 5 MG tablet Take 5 mg by mouth daily.    ? rivaroxaban (XARELTO) 2.5 MG TABS tablet Take 1 tablet by mouth 2 (two) times daily.    ? rosuvastatin (CRESTOR) 20 MG tablet Take 1 tablet (20 mg total) by mouth daily at 6 PM.    ? sodium bicarbonate 650 MG tablet Take 1,300 mg by mouth 2 (two) times daily.    ? tacrolimus (PROGRAF) 1 MG capsule Take 2-3  mg by mouth See admin instructions. Take 3mg  in the morning and 2mg  at night, 12 hours apart.    ? tamsulosin (FLOMAX) 0.4 MG CAPS capsule Take 0.4 mg by mouth at bedtime.    ? ?No current facility-administered medications for this visit.  ? ? ?Family History  ?Problem Relation Age of Onset  ? Hypertension Father   ? Diabetes Mother   ? ? ?Social History  ? ?Socioeconomic History  ? Marital status: Single  ?  Spouse name: Not on file  ? Number of children: Not on file  ? Years of education: Not on file  ? Highest education level: Not on file  ?Occupational History  ? Occupation: Unemployed Administrator  ?Tobacco Use  ? Smoking status: Every Day  ?  Packs/day: 0.50  ?  Years: 40.00  ?  Pack years: 20.00  ?  Types: Cigarettes  ? Smokeless tobacco: Never  ?Vaping Use  ? Vaping Use: Never used  ?Substance and Sexual Activity  ? Alcohol use: Yes  ?  Alcohol/week: 12.0 standard drinks  ?  Types: 12 Cans of beer per week  ? Drug use: No  ? Sexual activity: Never  ?  Birth control/protection: None  ?Other Topics Concern  ? Not on file  ?Social History Narrative  ? Not on file  ? ?Social Determinants of Health  ? ?Financial Resource Strain: Not on file  ?Food Insecurity: Not on file  ?Transportation Needs: Not on file  ?Physical Activity: Not on file  ?Stress: Not on file  ?Social Connections: Not on file  ?Intimate  Partner Violence: Not on file  ? ? ? ?REVIEW OF SYSTEMS:  ? ?[X]  denotes positive finding, [ ]  denotes negative finding ?Cardiac  Comments:  ?Chest pain or chest pressure:    ?Shortness of breath upon exertion:    ?Short of

## 2022-02-12 ENCOUNTER — Ambulatory Visit (HOSPITAL_COMMUNITY)
Admission: RE | Admit: 2022-02-12 | Discharge: 2022-02-12 | Disposition: A | Discharge status: Home / Self Care | Payer: 59 | Source: Ambulatory Visit | Attending: Surgery | Admitting: Surgery

## 2022-02-12 ENCOUNTER — Ambulatory Visit (INDEPENDENT_AMBULATORY_CARE_PROVIDER_SITE_OTHER): Payer: 59 | Admitting: Physician Assistant

## 2022-02-12 ENCOUNTER — Ambulatory Visit (INDEPENDENT_AMBULATORY_CARE_PROVIDER_SITE_OTHER)
Admission: RE | Admit: 2022-02-12 | Discharge: 2022-02-12 | Disposition: A | Discharge status: Home / Self Care | Payer: 59 | Source: Ambulatory Visit | Attending: Surgery | Admitting: Surgery

## 2022-02-12 VITALS — BP 174/100 | HR 67 | Temp 97.5°F | Resp 18 | Ht 66.0 in | Wt 170.0 lb

## 2022-02-12 DIAGNOSIS — I739 Peripheral vascular disease, unspecified: Secondary | ICD-10-CM | POA: Insufficient documentation

## 2022-02-12 DIAGNOSIS — F17200 Nicotine dependence, unspecified, uncomplicated: Secondary | ICD-10-CM | POA: Diagnosis not present

## 2022-02-14 ENCOUNTER — Other Ambulatory Visit: Payer: Self-pay

## 2022-02-14 DIAGNOSIS — I739 Peripheral vascular disease, unspecified: Secondary | ICD-10-CM

## 2022-02-19 ENCOUNTER — Telehealth: Payer: Self-pay

## 2022-02-19 NOTE — Telephone Encounter (Signed)
-----   Message from Cathlean Cower sent at 02/16/2022  5:09 PM EDT ----- Regarding: RE: CTA 02/16/22 - called patient at home.  Vm is not set up.   ----- Message ----- From: [name], LPN Sent: 4/37/0052   2:47 PM EDT To: April H Pait, Cathlean Cower, # Subject: CTA                                            Please schedule prior to appt on 02/27/22.  Thanks.

## 2022-02-22 ENCOUNTER — Ambulatory Visit (HOSPITAL_COMMUNITY): Admission: RE | Admit: 2022-02-22 | Payer: 59 | Source: Ambulatory Visit

## 2022-02-22 ENCOUNTER — Ambulatory Visit (HOSPITAL_COMMUNITY): Payer: 59

## 2022-02-27 ENCOUNTER — Ambulatory Visit: Payer: 59 | Admitting: Vascular Surgery

## 2022-03-09 ENCOUNTER — Telehealth: Payer: Self-pay | Admitting: Vascular Surgery

## 2022-03-09 NOTE — Telephone Encounter (Signed)
Have spoken with patient about making appt for his CT scan. Theodore Arellano tried to call to make appt and he has no vm.  I told him he needs to call [facility] and set up CT at a convenient time with him. He said he does not know if he wants to have it or not.

## 2022-03-21 ENCOUNTER — Encounter (HOSPITAL_BASED_OUTPATIENT_CLINIC_OR_DEPARTMENT_OTHER): Payer: 59 | Attending: General Surgery | Admitting: General Surgery

## 2022-03-21 DIAGNOSIS — E1151 Type 2 diabetes mellitus with diabetic peripheral angiopathy without gangrene: Secondary | ICD-10-CM | POA: Insufficient documentation

## 2022-03-21 DIAGNOSIS — I87311 Chronic venous hypertension (idiopathic) with ulcer of right lower extremity: Secondary | ICD-10-CM | POA: Diagnosis not present

## 2022-03-21 DIAGNOSIS — L97212 Non-pressure chronic ulcer of right calf with fat layer exposed: Secondary | ICD-10-CM | POA: Insufficient documentation

## 2022-03-21 DIAGNOSIS — N186 End stage renal disease: Secondary | ICD-10-CM | POA: Diagnosis not present

## 2022-03-21 DIAGNOSIS — I2510 Atherosclerotic heart disease of native coronary artery without angina pectoris: Secondary | ICD-10-CM | POA: Insufficient documentation

## 2022-03-21 DIAGNOSIS — I120 Hypertensive chronic kidney disease with stage 5 chronic kidney disease or end stage renal disease: Secondary | ICD-10-CM | POA: Diagnosis not present

## 2022-03-21 DIAGNOSIS — E1122 Type 2 diabetes mellitus with diabetic chronic kidney disease: Secondary | ICD-10-CM | POA: Insufficient documentation

## 2022-03-21 DIAGNOSIS — E11622 Type 2 diabetes mellitus with other skin ulcer: Secondary | ICD-10-CM | POA: Diagnosis present

## 2022-03-28 ENCOUNTER — Encounter (HOSPITAL_BASED_OUTPATIENT_CLINIC_OR_DEPARTMENT_OTHER): Payer: 59 | Admitting: General Surgery

## 2022-03-28 DIAGNOSIS — E1151 Type 2 diabetes mellitus with diabetic peripheral angiopathy without gangrene: Secondary | ICD-10-CM | POA: Diagnosis not present

## 2022-03-30 NOTE — Progress Notes (Signed)
ANANT, AGARD ([id number]) Visit Report for 03/28/2022 Arrival Information Details Patient Name: Date of Service: Theodore Arellano, Theodore Arellano 03/28/2022 3:45 PM Medical Record Number: [id number] Patient Account Number: [id number] Date of Birth/Sex: Treating RN: [birth year] (61 y.o. Theodore Arellano, Theodore Arellano Primary Care Theodore Arellano: Theodore Arellano Other Clinician: Referring Theodore Arellano: Treating Theodore Arellano: Theodore Arellano in Treatment: 1 Visit Information History Since Last Visit Added or deleted any medications: No Patient Arrived: Ambulatory Any new allergies or adverse reactions: No Arrival Time: 15:39 Had a fall or experienced change in No Accompanied By: self activities of daily living that may affect Transfer Assistance: None risk of falls: Patient Identification Verified: Yes Signs or symptoms of abuse/neglect since last visito No Secondary Verification Process Completed: Yes Hospitalized since last visit: No Patient Has Alerts: Yes Implantable device outside of the clinic excluding No cellular tissue based products placed in the center since last visit: Has Dressing in Place as Prescribed: Yes Has Compression in Place as Prescribed: Yes Pain Present Now: No Electronic Signature(s) Signed: 03/28/2022 5:01:07 PM By: Adline Peals Entered By: Adline Peals on 03/28/2022 15:40:44 -------------------------------------------------------------------------------- Encounter Discharge Information Details Patient Name: Date of Service: Theodore Arellano. 03/28/2022 3:45 PM Medical Record Number: [id number] Patient Account Number: [id number] Date of Birth/Sex: Treating RN: October 25, 1960 (61 y.o. Theodore Arellano Primary Care Thressa Shiffer: Theodore Arellano Other Clinician: Referring Axle Parfait: Treating Kambree Krauss/Extender: Theodore Arellano in Treatment: 1 Encounter Discharge Information Items Post Procedure  Vitals Discharge Condition: Stable Temperature (F): 98.1 Ambulatory Status: Ambulatory Pulse (bpm): 66 Discharge Destination: Home Respiratory Rate (breaths/min): 18 Transportation: Private Auto Blood Pressure (mmHg): 152/76 Accompanied By: self Schedule Follow-up Appointment: Yes Clinical Summary of Care: Patient Declined Electronic Signature(s) Signed: 03/28/2022 5:01:07 PM By: Adline Peals Entered By: Adline Peals on 03/28/2022 16:43:03 -------------------------------------------------------------------------------- Lower Extremity Assessment Details Patient Name: Date of Service: Theodore Arellano. 03/28/2022 3:45 PM Medical Record Number: [id number] Patient Account Number: [id number] Date of Birth/Sex: Treating RN: 05/01/1961 (61 y.o. Theodore Arellano Primary Care Sameena Artus: Theodore Arellano Other Clinician: Referring Richards Pherigo: Treating Aithan Farrelly/Extender: Ulice Brilliant Weeks in Treatment: 1 Edema Assessment Assessed: [Left: No] [Right: No] E[Left: dema] [Right: :] Calf Left: Right: Point of Measurement: From Medial Instep 30.8 cm Ankle Left: Right: Point of Measurement: From Medial Instep 26.5 cm Vascular Assessment Pulses: Dorsalis Pedis Palpable: [Right:Yes] Electronic Signature(s) Signed: 03/28/2022 5:01:07 PM By: Adline Peals Entered By: Adline Peals on 03/28/2022 15:45:09 -------------------------------------------------------------------------------- Multi Wound Chart Details Patient Name: Date of Service: Theodore Arellano. 03/28/2022 3:45 PM Medical Record Number: [id number] Patient Account Number: [id number] Date of Birth/Sex: Treating RN: Feb 07, 1961 (61 y.o. Theodore Arellano Primary Care Xyler Terpening: Theodore Arellano Other Clinician: Referring Renalda Locklin: Treating Royalti Schauf/Extender: Ulice Brilliant Weeks in Treatment: 1 Vital Signs Height(in): 67 Pulse(bpm):  57 Weight(lbs): 172 Blood Pressure(mmHg): 152/76 Body Mass Index(BMI): 26.9 Temperature(F): 98.1 Respiratory Rate(breaths/min): 18 Photos: [N/A:N/A] Right Lower Leg N/A N/A Wound Location: Gradually Appeared N/A N/A Wounding Event: Venous Leg Ulcer N/A N/A Primary Etiology: Coronary Artery Disease, N/A N/A Comorbid History: Hypertension, Gout, Osteoarthritis 08/31/2021 N/A N/A Date Acquired: 1 N/A N/A Weeks of Treatment: Open N/A N/A Wound Status: No N/A N/A Wound Recurrence: 18x7.5x0.1 N/A N/A Measurements L x W x D (cm) 106.029 N/A N/A A (cm) : rea 10.603 N/A N/A Volume (cm) : 70.00% N/A N/A % Reduction in Area: 70.00% N/A N/A % Reduction in Volume: Full Thickness Without Exposed N/A N/A Classification:  Support Structures Medium N/A N/A Exudate A mount: Purulent N/A N/A Exudate Type: yellow, [place], green N/A N/A Exudate Color: Yes N/A N/A Foul Odor A Cleansing: fter No N/A N/A Odor Anticipated Due to Product Use: Indistinct, nonvisible N/A N/A Wound Margin: Small (1-33%) N/A N/A Granulation A mount: Red, Pink N/A N/A Granulation Quality: Large (67-100%) N/A N/A Necrotic Amount: Eschar, Adherent Slough N/A N/A Necrotic Tissue: Fat Layer (Subcutaneous Tissue): Yes N/A N/A Exposed Structures: Fascia: No Tendon: No Muscle: No Joint: No Bone: No None N/A N/A Epithelialization: Debridement - Excisional N/A N/A Debridement: Pre-procedure Verification/Time Out 15:37 N/A N/A Taken: Lidocaine 5% topical ointment N/A N/A Pain Control: Subcutaneous, Slough N/A N/A Tissue Debrided: Skin/Subcutaneous Tissue N/A N/A Level: 135 N/A N/A Debridement A (sq cm): rea Curette N/A N/A Instrument: Minimum N/A N/A Bleeding: Pressure N/A N/A Hemostasis A chieved: 0 N/A N/A Procedural Pain: 0 N/A N/A Post Procedural Pain: Procedure was tolerated well N/A N/A Debridement Treatment Response: 18x7.5x0.1 N/A N/A Post Debridement Measurements L x W  x D (cm) 10.603 N/A N/A Post Debridement Volume: (cm) Debridement N/A N/A Procedures Performed: Treatment Notes Wound #1 (Lower Leg) Wound Laterality: Right Cleanser Soap and Water Discharge Instruction: May shower and wash wound with dial antibacterial soap and water prior to dressing change. Wound Cleanser Discharge Instruction: Cleanse the wound with wound cleanser prior to applying a clean dressing using gauze sponges, not tissue or cotton balls. Peri-Wound Care Triamcinolone 15 (g) Discharge Instruction: Use triamcinolone 15 (g) as directed Sween Lotion (Moisturizing lotion) Discharge Instruction: Apply moisturizing lotion as directed Topical Gentamicin Discharge Instruction: As directed by physician Ketoconazole Cream 2% Discharge Instruction: Apply Ketoconazole as directed Primary Dressing KerraCel Ag Gelling Fiber Dressing, 4x5 in (silver alginate) Discharge Instruction: Apply silver alginate to wound bed as instructed Secondary Dressing ABD Pad, 8x10 Discharge Instruction: Apply over primary dressing as directed. Secured With Compression Wrap ThreePress (3 layer compression wrap) Discharge Instruction: Apply three layer compression as directed. Compression Stockings Add-Ons Electronic Signature(s) Signed: 03/28/2022 4:47:11 PM By: Fredirick Maudlin MD FACS Signed: 03/28/2022 5:01:07 PM By: Sabas Sous By: Fredirick Maudlin on 03/28/2022 16:47:11 -------------------------------------------------------------------------------- Multi-Disciplinary Care Plan Details Patient Name: Date of Service: Theodore Arellano. 03/28/2022 3:45 PM Medical Record Number: [id number] Patient Account Number: [id number] Date of Birth/Sex: Treating RN: 06-Jan-1961 (61 y.o. Theodore Arellano Primary Care Acire Tang: Theodore Arellano Other Clinician: Referring Everitt Wenner: Treating Evett Kassa/Extender: Ulice Brilliant Weeks in Treatment: 1 Active  Inactive Nutrition Nursing Diagnoses: Potential for alteratiion in Nutrition/Potential for imbalanced nutrition Goals: Patient/caregiver agrees to and verbalizes understanding of need to use nutritional supplements and/or vitamins as prescribed Date Initiated: 03/21/2022 Target Resolution Date: 04/26/2022 Goal Status: Active Interventions: Provide education on nutrition Notes: Venous Leg Ulcer Nursing Diagnoses: Knowledge deficit related to disease process and management Goals: Patient/caregiver will verbalize understanding of disease process and disease management Date Initiated: 03/21/2022 Target Resolution Date: 04/26/2022 Goal Status: Active Interventions: Assess peripheral edema status every visit. Compression as ordered Notes: Wound/Skin Impairment Nursing Diagnoses: Knowledge deficit related to smoking impact on wound healing Knowledge deficit related to ulceration/compromised skin integrity Goals: Patient/caregiver will verbalize understanding of skin care regimen Date Initiated: 03/21/2022 Target Resolution Date: 04/26/2022 Goal Status: Active Ulcer/skin breakdown will have a volume reduction of 30% by week 4 Date Initiated: 03/21/2022 Target Resolution Date: 04/26/2022 Goal Status: Active Interventions: Assess patient/caregiver ability to obtain necessary supplies Assess patient/caregiver ability to perform ulcer/skin care regimen upon admission and as needed Assess ulceration(s) every visit  Provide education on smoking Notes: Electronic Signature(s) Signed: 03/28/2022 5:01:07 PM By: Adline Peals Entered By: Adline Peals on 03/28/2022 15:50:49 -------------------------------------------------------------------------------- Pain Assessment Details Patient Name: Date of Service: Theodore Arellano. 03/28/2022 3:45 PM Medical Record Number: [id number] Patient Account Number: [id number] Date of Birth/Sex: Treating RN: 15-Feb-1961 (61 y.o. Theodore Arellano Primary Care Diogo Anne: Theodore Arellano Other Clinician: Referring Arielis Leonhart: Treating Kalina Morabito/Extender: Ulice Brilliant Weeks in Treatment: 1 Active Problems Location of Pain Severity and Description of Pain Patient Has Paino No Site Locations Rate the pain. Current Pain Level: 0 Pain Management and Medication Current Pain Management: Electronic Signature(s) Signed: 03/28/2022 5:01:07 PM By: Adline Peals Entered By: Adline Peals on 03/28/2022 15:40:52 -------------------------------------------------------------------------------- Patient/Caregiver Education Details Patient Name: Date of Service: Theodore Arellano 6/28/2023andnbsp3:45 PM Medical Record Number: [id number] Patient Account Number: [id number] Date of Birth/Gender: Treating RN: 1961/01/17 (61 y.o. Theodore Arellano Primary Care Physician: Theodore Arellano Other Clinician: Referring Physician: Treating Physician/Extender: Theodore Arellano in Treatment: 1 Education Assessment Education Provided To: Patient Education Topics Provided Wound/Skin Impairment: Methods: Explain/Verbal Responses: Reinforcements needed, State content correctly Electronic Signature(s) Signed: 03/28/2022 5:01:07 PM By: Adline Peals Entered By: Adline Peals on 03/28/2022 15:51:01 -------------------------------------------------------------------------------- Wound Assessment Details Patient Name: Date of Service: Theodore Arellano. 03/28/2022 3:45 PM Medical Record Number: [id number] Patient Account Number: [id number] Date of Birth/Sex: Treating RN: 05-26-1961 (61 y.o. Theodore Arellano Primary Care Tavarius Grewe: Theodore Arellano Other Clinician: Referring Nyzaiah Kai: Treating Andalyn Heckstall/Extender: Ulice Brilliant Weeks in Treatment: 1 Wound Status Wound Number: 1 Primary Etiology: Venous Leg Ulcer Wound Location:  Right Lower Leg Wound Status: Open Wounding Event: Gradually Appeared Comorbid Coronary Artery Disease, Hypertension, Gout, History: Osteoarthritis Date Acquired: 08/31/2021 Weeks Of Treatment: 1 Clustered Wound: No Photos Wound Measurements Length: (cm) 18 Width: (cm) 7.5 Depth: (cm) 0.1 Area: (cm) 106.029 Volume: (cm) 10.603 % Reduction in Area: 70% % Reduction in Volume: 70% Epithelialization: None Tunneling: No Undermining: No Wound Description Classification: Full Thickness Without Exposed Support Structures Wound Margin: Indistinct, nonvisible Exudate Amount: Medium Exudate Type: Purulent Exudate Color: yellow, [place], green Foul Odor After Cleansing: Yes Due to Product Use: No Slough/Fibrino Yes Wound Bed Granulation Amount: Small (1-33%) Exposed Structure Granulation Quality: Red, Pink Fascia Exposed: No Necrotic Amount: Large (67-100%) Fat Layer (Subcutaneous Tissue) Exposed: Yes Necrotic Quality: Eschar, Adherent Slough Tendon Exposed: No Muscle Exposed: No Joint Exposed: No Bone Exposed: No Treatment Notes Wound #1 (Lower Leg) Wound Laterality: Right Cleanser Soap and Water Discharge Instruction: May shower and wash wound with dial antibacterial soap and water prior to dressing change. Wound Cleanser Discharge Instruction: Cleanse the wound with wound cleanser prior to applying a clean dressing using gauze sponges, not tissue or cotton balls. Peri-Wound Care Triamcinolone 15 (g) Discharge Instruction: Use triamcinolone 15 (g) as directed Sween Lotion (Moisturizing lotion) Discharge Instruction: Apply moisturizing lotion as directed Topical Gentamicin Discharge Instruction: As directed by physician Ketoconazole Cream 2% Discharge Instruction: Apply Ketoconazole as directed Primary Dressing KerraCel Ag Gelling Fiber Dressing, 4x5 in (silver alginate) Discharge Instruction: Apply silver alginate to wound bed as instructed Secondary Dressing ABD Pad,  8x10 Discharge Instruction: Apply over primary dressing as directed. Secured With Compression Wrap ThreePress (3 layer compression wrap) Discharge Instruction: Apply three layer compression as directed. Compression Stockings Add-Ons Electronic Signature(s) Signed: 03/28/2022 5:01:07 PM By: Adline Peals Signed: 03/30/2022 11:23:28 AM By: Sandre Kitty Entered By: Sandre Kitty on 03/28/2022 15:47:45 -------------------------------------------------------------------------------- Vitals Details Patient  Name: Date of Service: WILKES, POTVIN 03/28/2022 3:45 PM Medical Record Number: [id number] Patient Account Number: [id number] Date of Birth/Sex: Treating RN: 1961-02-13 (61 y.o. Theodore Arellano Primary Care Vinessa Macconnell: Theodore Arellano Other Clinician: Referring Pola Furno: Treating Novi Calia/Extender: Ulice Brilliant Weeks in Treatment: 1 Vital Signs Time Taken: 15:44 Temperature (F): 98.1 Height (in): 67 Pulse (bpm): 66 Weight (lbs): 172 Respiratory Rate (breaths/min): 18 Body Mass Index (BMI): 26.9 Blood Pressure (mmHg): 152/76 Reference Range: 80 - 120 mg / dl Electronic Signature(s) Signed: 03/28/2022 5:01:07 PM By: Adline Peals Entered By: Adline Peals on 03/28/2022 15:44:20

## 2022-04-05 ENCOUNTER — Ambulatory Visit (HOSPITAL_BASED_OUTPATIENT_CLINIC_OR_DEPARTMENT_OTHER): Payer: 59 | Admitting: General Surgery

## 2022-04-10 ENCOUNTER — Encounter (HOSPITAL_BASED_OUTPATIENT_CLINIC_OR_DEPARTMENT_OTHER): Payer: 59 | Attending: General Surgery | Admitting: General Surgery

## 2022-04-10 DIAGNOSIS — E1122 Type 2 diabetes mellitus with diabetic chronic kidney disease: Secondary | ICD-10-CM | POA: Insufficient documentation

## 2022-04-10 DIAGNOSIS — E1159 Type 2 diabetes mellitus with other circulatory complications: Secondary | ICD-10-CM | POA: Diagnosis not present

## 2022-04-10 DIAGNOSIS — F17210 Nicotine dependence, cigarettes, uncomplicated: Secondary | ICD-10-CM | POA: Insufficient documentation

## 2022-04-10 DIAGNOSIS — I87311 Chronic venous hypertension (idiopathic) with ulcer of right lower extremity: Secondary | ICD-10-CM | POA: Diagnosis not present

## 2022-04-10 DIAGNOSIS — L97212 Non-pressure chronic ulcer of right calf with fat layer exposed: Secondary | ICD-10-CM | POA: Insufficient documentation

## 2022-04-10 DIAGNOSIS — E1151 Type 2 diabetes mellitus with diabetic peripheral angiopathy without gangrene: Secondary | ICD-10-CM | POA: Diagnosis not present

## 2022-04-10 DIAGNOSIS — I2510 Atherosclerotic heart disease of native coronary artery without angina pectoris: Secondary | ICD-10-CM | POA: Diagnosis not present

## 2022-04-10 DIAGNOSIS — N186 End stage renal disease: Secondary | ICD-10-CM | POA: Diagnosis not present

## 2022-04-10 NOTE — Progress Notes (Signed)
CAINE, BARFIELD ([id number]) Visit Report for 04/10/2022 Arrival Information Details Patient Name: Date of Service: JAYLEE, LANTRY. 04/10/2022 10:00 A M Medical Record Number: [id number] Patient Account Number: [id number] Date of Birth/Sex: Treating RN: November 29, 1960 (61 y.o. Ulyses Amor, Vaughan Basta Primary Care Patience Nuzzo: [name] Other Clinician: Referring Analeese Andreatta: Treating Averi Cacioppo/Extender: Alda Lea in Treatment: 2 Visit Information History Since Last Visit Added or deleted any medications: No Patient Arrived: Ambulatory Any new allergies or adverse reactions: No Arrival Time: 10:15 Had a fall or experienced change in No Accompanied By: self activities of daily living that may affect Transfer Assistance: None risk of falls: Patient Identification Verified: Yes Signs or symptoms of abuse/neglect since last visito No Secondary Verification Process Completed: Yes Hospitalized since last visit: No Patient Has Alerts: Yes Implantable device outside of the clinic excluding No cellular tissue based products placed in the center since last visit: Has Dressing in Place as Prescribed: Yes Has Compression in Place as Prescribed: Yes Pain Present Now: No Electronic Signature(s) Signed: 04/10/2022 6:01:19 PM By: Baruch Gouty RN, BSN Entered By: Baruch Gouty on 04/10/2022 10:19:19 -------------------------------------------------------------------------------- Encounter Discharge Information Details Patient Name: Date of Service: Lynn Ito. 04/10/2022 10:00 A M Medical Record Number: [id number] Patient Account Number: [id number] Date of Birth/Sex: Treating RN: Mar 20, 1961 (61 y.o. Janyth Contes Primary Care Georganne Siple: [name] Other Clinician: Referring Norina Cowper: Treating Timberlyn Pickford/Extender: Alda Lea in Treatment: 2 Encounter Discharge Information Items Post Procedure  Vitals Discharge Condition: Stable Temperature (F): 98.5 Ambulatory Status: Ambulatory Pulse (bpm): 63 Discharge Destination: Home Respiratory Rate (breaths/min): 18 Transportation: Private Auto Blood Pressure (mmHg): 165/82 Accompanied By: self Schedule Follow-up Appointment: Yes Clinical Summary of Care: Patient Declined Electronic Signature(s) Signed: 04/10/2022 4:46:37 PM By: Adline Peals Entered By: Adline Peals on 04/10/2022 12:12:49 -------------------------------------------------------------------------------- Lower Extremity Assessment Details Patient Name: Date of Service: Lynn Ito. 04/10/2022 10:00 A M Medical Record Number: [id number] Patient Account Number: [id number] Date of Birth/Sex: Treating RN: 12-26-1960 (61 y.o. Ernestene Mention Primary Care Laronica Bhagat: [name] Other Clinician: Referring Amanuel Sinkfield: Treating Rheanna Sergent/Extender: Ulice Brilliant Weeks in Treatment: 2 Edema Assessment Assessed: [Left: No] [Right: No] E[Left: dema] [Right: :] Calf Left: Right: Point of Measurement: From Medial Instep 30.5 cm Ankle Left: Right: Point of Measurement: From Medial Instep 23.5 cm Vascular Assessment Pulses: Dorsalis Pedis Palpable: [Right:Yes] Electronic Signature(s) Signed: 04/10/2022 6:01:19 PM By: Baruch Gouty RN, BSN Entered By: Baruch Gouty on 04/10/2022 10:31:43 -------------------------------------------------------------------------------- Multi Wound Chart Details Patient Name: Date of Service: Lynn Ito. 04/10/2022 10:00 A M Medical Record Number: [id number] Patient Account Number: [id number] Date of Birth/Sex: Treating RN: 1960/10/28 (61 y.o. Janyth Contes Primary Care Masiel Gentzler: [name] Other Clinician: Referring Cordaro Mukai: Treating Donicia Druck/Extender: Ulice Brilliant Weeks in Treatment: 2 Vital Signs Height(in): 69 Pulse(bpm):  54 Weight(lbs): 172 Blood Pressure(mmHg): 165/82 Body Mass Index(BMI): 26.9 Temperature(F): 98.5 Respiratory Rate(breaths/min): 18 Photos: [N/A:N/A] Right Lower Leg N/A N/A Wound Location: Gradually Appeared N/A N/A Wounding Event: Venous Leg Ulcer N/A N/A Primary Etiology: Coronary Artery Disease, N/A N/A Comorbid History: Hypertension, Gout, Osteoarthritis 08/31/2021 N/A N/A Date Acquired: 2 N/A N/A Weeks of Treatment: Open N/A N/A Wound Status: No N/A N/A Wound Recurrence: 9x20.5x0.1 N/A N/A Measurements L x W x D (cm) 144.906 N/A N/A A (cm) : rea 14.491 N/A N/A Volume (cm) : 59.00% N/A N/A % Reduction in Area: 59.00% N/A N/A % Reduction in  Volume: Full Thickness Without Exposed N/A N/A Classification: Support Structures Large N/A N/A Exudate Amount: Purulent N/A N/A Exudate Type: yellow, [place]isel, green N/A N/A Exudate Color: Yes N/A N/A Foul Odor A Cleansing: fter No N/A N/A Odor Anticipated Due to Product Use: Indistinct, nonvisible N/A N/A Wound Margin: Small (1-33%) N/A N/A Granulation A mount: Pink N/A N/A Granulation Quality: Large (67-100%) N/A N/A Necrotic Amount: Fat Layer (Subcutaneous Tissue): Yes N/A N/A Exposed Structures: Fascia: No Tendon: No Muscle: No Joint: No Bone: No Small (1-33%) N/A N/A Epithelialization: Debridement - Excisional N/A N/A Debridement: Pre-procedure Verification/Time Out 10:52 N/A N/A Taken: Lidocaine 4% Topical Solution N/A N/A Pain Control: Subcutaneous, Slough N/A N/A Tissue Debrided: Skin/Subcutaneous Tissue N/A N/A Level: 184.5 N/A N/A Debridement A (sq cm): rea Curette N/A N/A Instrument: Minimum N/A N/A Bleeding: Pressure N/A N/A Hemostasis A chieved: 0 N/A N/A Procedural Pain: 0 N/A N/A Post Procedural Pain: Procedure was tolerated well N/A N/A Debridement Treatment Response: 9x20.5x0.1 N/A N/A Post Debridement Measurements L x W x D (cm) 14.491 N/A N/A Post Debridement  Volume: (cm) Debridement N/A N/A Procedures Performed: Treatment Notes Electronic Signature(s) Signed: 04/10/2022 11:06:54 AM By: Fredirick Maudlin MD FACS Signed: 04/10/2022 4:46:37 PM By: Adline Peals Entered By: Fredirick Maudlin on 04/10/2022 11:06:54 -------------------------------------------------------------------------------- Multi-Disciplinary Care Plan Details Patient Name: Date of Service: Lynn Ito. 04/10/2022 10:00 A M Medical Record Number: [id number] Patient Account Number: [id number] Date of Birth/Sex: Treating RN: 1961-02-21 (61 y.o. Ernestene Mention Primary Care Dustie Brittle: [name] Other Clinician: Referring Ouida Abeyta: Treating Euleta Belson/Extender: Ulice Brilliant Weeks in Treatment: 2 Active Inactive Nutrition Nursing Diagnoses: Potential for alteratiion in Nutrition/Potential for imbalanced nutrition Goals: Patient/caregiver agrees to and verbalizes understanding of need to use nutritional supplements and/or vitamins as prescribed Date Initiated: 03/21/2022 Target Resolution Date: 04/26/2022 Goal Status: Active Interventions: Provide education on nutrition Notes: Venous Leg Ulcer Nursing Diagnoses: Knowledge deficit related to disease process and management Goals: Patient/caregiver will verbalize understanding of disease process and disease management Date Initiated: 03/21/2022 Target Resolution Date: 04/26/2022 Goal Status: Active Interventions: Assess peripheral edema status every visit. Compression as ordered Notes: Wound/Skin Impairment Nursing Diagnoses: Knowledge deficit related to smoking impact on wound healing Knowledge deficit related to ulceration/compromised skin integrity Goals: Patient/caregiver will verbalize understanding of skin care regimen Date Initiated: 03/21/2022 Target Resolution Date: 04/26/2022 Goal Status: Active Ulcer/skin breakdown will have a volume reduction of 30% by week  4 Date Initiated: 03/21/2022 Target Resolution Date: 04/26/2022 Goal Status: Active Interventions: Assess patient/caregiver ability to obtain necessary supplies Assess patient/caregiver ability to perform ulcer/skin care regimen upon admission and as needed Assess ulceration(s) every visit Provide education on smoking Notes: Electronic Signature(s) Signed: 04/10/2022 6:01:19 PM By: Baruch Gouty RN, BSN Entered By: Baruch Gouty on 04/10/2022 10:37:53 -------------------------------------------------------------------------------- Pain Assessment Details Patient Name: Date of Service: Lynn Ito. 04/10/2022 10:00 A M Medical Record Number: [id number] Patient Account Number: [id number] Date of Birth/Sex: Treating RN: 1961-04-23 (61 y.o. Ernestene Mention Primary Care Kylee Nardozzi: [name] Other Clinician: Referring Aminah Zabawa: Treating Zykerria Tanton/Extender: Ulice Brilliant Weeks in Treatment: 2 Active Problems Location of Pain Severity and Description of Pain Patient Has Paino No Site Locations Rate the pain. Rate the pain. Current Pain Level: 0 Pain Management and Medication Current Pain Management: Electronic Signature(s) Signed: 04/10/2022 6:01:19 PM By: Baruch Gouty RN, BSN Entered By: Baruch Gouty on 04/10/2022 10:21:00 -------------------------------------------------------------------------------- Patient/Caregiver Education Details Patient Name: Date of Service: Lynn Ito. 7/11/2023andnbsp10:00 A  M Medical Record Number: [id number] Patient Account Number: [id number] Date of Birth/Gender: Treating RN: 08-07-1961 (61 y.o. Ernestene Mention Primary Care Physician: [name] Other Clinician: Referring Physician: Treating Physician/Extender: Alda Lea in Treatment: 2 Education Assessment Education Provided To: Patient Education Topics Provided Venous: Methods:  Explain/Verbal Responses: Reinforcements needed, State content correctly Electronic Signature(s) Signed: 04/10/2022 6:01:19 PM By: Baruch Gouty RN, BSN Entered By: Baruch Gouty on 04/10/2022 10:38:10 -------------------------------------------------------------------------------- Wound Assessment Details Patient Name: Date of Service: Lynn Ito. 04/10/2022 10:00 A M Medical Record Number: [id number] Patient Account Number: [id number] Date of Birth/Sex: Treating RN: Jan 01, 1961 (61 y.o. Ernestene Mention Primary Care Crystal Ellwood: [name] Other Clinician: Referring Kayon Dozier: Treating Laramie Meissner/Extender: Ulice Brilliant Weeks in Treatment: 2 Wound Status Wound Number: 1 Primary Etiology: Venous Leg Ulcer Wound Location: Right Lower Leg Wound Status: Open Wounding Event: Gradually Appeared Comorbid Coronary Artery Disease, Hypertension, Gout, History: Osteoarthritis Date Acquired: 08/31/2021 Weeks Of Treatment: 2 Clustered Wound: No Photos Wound Measurements Length: (cm) 9 Width: (cm) 20.5 Depth: (cm) 0.1 Area: (cm) 144.906 Volume: (cm) 14.491 % Reduction in Area: 59% % Reduction in Volume: 59% Epithelialization: Small (1-33%) Tunneling: No Undermining: No Wound Description Classification: Full Thickness Without Exposed Support Structures Wound Margin: Indistinct, nonvisible Exudate Amount: Large Exudate Type: Purulent Exudate Color: yellow, [place]er, green Foul Odor After Cleansing: Yes Due to Product Use: No Slough/Fibrino Yes Wound Bed Granulation Amount: Small (1-33%) Exposed Structure Granulation Quality: Pink Fascia Exposed: No Necrotic Amount: Large (67-100%) Fat Layer (Subcutaneous Tissue) Exposed: Yes Necrotic Quality: Adherent Slough Tendon Exposed: No Muscle Exposed: No Joint Exposed: No Bone Exposed: No Treatment Notes Wound #1 (Lower Leg) Wound Laterality: Right Cleanser Soap and Water Discharge  Instruction: May shower and wash wound with dial antibacterial soap and water prior to dressing change. Wound Cleanser Discharge Instruction: Cleanse the wound with wound cleanser prior to applying a clean dressing using gauze sponges, not tissue or cotton balls. Peri-Wound Care Triamcinolone 15 (g) Discharge Instruction: Use triamcinolone 15 (g) as directed Sween Lotion (Moisturizing lotion) Discharge Instruction: Apply moisturizing lotion as directed Topical keystone Primary Dressing KerraCel Ag Gelling Fiber Dressing, 4x5 in (silver alginate) Discharge Instruction: Apply silver alginate to wound bed as instructed Secondary Dressing ABD Pad, 8x10 Discharge Instruction: Apply over primary dressing as directed. Secured With Compression Wrap ThreePress (3 layer compression wrap) Discharge Instruction: Apply three layer compression as directed. Compression Stockings Add-Ons Electronic Signature(s) Signed: 04/10/2022 6:01:19 PM By: Baruch Gouty RN, BSN Entered By: Baruch Gouty on 04/10/2022 10:34:36 -------------------------------------------------------------------------------- Vitals Details Patient Name: Date of Service: Lynn Ito. 04/10/2022 10:00 A M Medical Record Number: [id number] Patient Account Number: [id number] Date of Birth/Sex: Treating RN: 30-Oct-1960 (61 y.o. Ernestene Mention Primary Care Patryk Conant: [name] Other Clinician: Referring Trayson Stitely: Treating Kylynn Street/Extender: Ulice Brilliant Weeks in Treatment: 2 Vital Signs Time Taken: 10:20 Temperature (F): 98.5 Height (in): 67 Pulse (bpm): 63 Weight (lbs): 172 Respiratory Rate (breaths/min): 18 Body Mass Index (BMI): 26.9 Blood Pressure (mmHg): 165/82 Reference Range: 80 - 120 mg / dl Electronic Signature(s) Signed: 04/10/2022 6:01:19 PM By: Baruch Gouty RN, BSN Entered By: Baruch Gouty on 04/10/2022 10:20:51

## 2022-04-10 NOTE — Progress Notes (Signed)
CORTLAN, DOLIN ([id number]) Visit Report for 04/10/2022 Chief Complaint Document Details Patient Name: Date of Service: ZADOK, HOLAWAY. 04/10/2022 10:00 A M Medical Record Number: [id number] Patient Account Number: [id number] Date of Birth/Sex: Treating RN: Nov 27, 1960 (61 y.o. Janyth Contes Primary Care Provider: [name] Other Clinician: Referring Provider: Treating Provider/Extender: Ulice Brilliant Weeks in Treatment: 2 Information Obtained from: Patient Chief Complaint Patient presents for treatment of an open ulcer due to venous insufficiency Electronic Signature(s) Signed: 04/10/2022 11:07:00 AM By: Fredirick Maudlin MD FACS Entered By: Fredirick Maudlin on 04/10/2022 11:07:00 -------------------------------------------------------------------------------- Debridement Details Patient Name: Date of Service: Lynn Ito. 04/10/2022 10:00 A M Medical Record Number: [id number] Patient Account Number: [id number] Date of Birth/Sex: Treating RN: Mar 18, 1961 (61 y.o. Janyth Contes Primary Care Provider: [name] Other Clinician: Referring Provider: Treating Provider/Extender: Ulice Brilliant Weeks in Treatment: 2 Debridement Performed for Assessment: Wound #1 Right Lower Leg Performed By: Physician Fredirick Maudlin, MD Debridement Type: Debridement Severity of Tissue Pre Debridement: Fat layer exposed Level of Consciousness (Pre-procedure): Awake and Alert Pre-procedure Verification/Time Out Yes - 10:52 Taken: Start Time: 10:52 Pain Control: Lidocaine 4% T opical Solution T Area Debrided (L x W): otal 9 (cm) x 20.5 (cm) = 184.5 (cm) Tissue and other material debrided: Viable, Non-Viable, Slough, Subcutaneous, Slough Level: Skin/Subcutaneous Tissue Debridement Description: Excisional Instrument: Curette Bleeding: Minimum Hemostasis Achieved: Pressure Procedural Pain: 0 Post Procedural  Pain: 0 Response to Treatment: Procedure was tolerated well Level of Consciousness (Post- Awake and Alert procedure): Post Debridement Measurements of Total Wound Length: (cm) 9 Width: (cm) 20.5 Depth: (cm) 0.1 Volume: (cm) 14.491 Character of Wound/Ulcer Post Debridement: Improved Severity of Tissue Post Debridement: Fat layer exposed Post Procedure Diagnosis Same as Pre-procedure Electronic Signature(s) Signed: 04/10/2022 4:27:02 PM By: Fredirick Maudlin MD FACS Signed: 04/10/2022 4:46:37 PM By: Adline Peals Entered By: Adline Peals on 04/10/2022 10:54:06 -------------------------------------------------------------------------------- HPI Details Patient Name: Date of Service: Lynn Ito. 04/10/2022 10:00 A M Medical Record Number: [id number] Patient Account Number: [id number] Date of Birth/Sex: Treating RN: 1961-03-12 (61 y.o. Janyth Contes Primary Care Provider: [name] Other Clinician: Referring Provider: Treating Provider/Extender: Ulice Brilliant Weeks in Treatment: 2 History of Present Illness HPI Description: ADMISSION 03/21/2022 This is a 61 year old type II diabetic diabetic with the most recent hemoglobin A1c available to me 6.4 but this is over a-year-old. He has a history of coronary artery disease, end-stage renal disease, peripheral artery disease (status post axillofemoral and Fem-PT bypasses), and venous insufficiency. His most recent vascular studies demonstrated abnormal TBI on the left but with normal ABIs bilaterally and normal TBI on the right. He has been followed at the [facility] wound care center for venous stasis ulcers. They have been using silver alginate and Tubigrip, according to the most recent notes available in the electronic medical record. I am not entirely sure why he decided to change centers and he even says that he has an appointment scheduled there tomorrow but he does not intend to keep it.  Nonetheless, the ulcers on his right leg are healed. On the left, however, he has nearly circumferential exposure of the fat layer just above the ankle. He has woody, tree bark-like skin and it appears that moisture has gotten underneath and into the crevices, resulting in ulceration. There is a strong odor coming from his wound. 03/28/2022: His wounds continue to have a strong fungus-like odor. He apparently did not pick  up either the Bactrim or the fluconazole that was prescribed. [employer] was ordered but due to some issues locating a functioning telephone number, he has not received the product. The wounds have a thick layer of slough on them and the woody skin around them is mushy. 04/10/2022: The patient missed his visit last week and therefore his wrap stayed in place for 2 weeks. Upon removal, there was a thick layer of slime and a fairly overwhelming odor. He did get his [employer] and he has it with him. He grew out multiple species from his culture and therefore Augmentin was added to the Bactrim and fluconazole. He says that he picked up the antibiotics and started taking them yesterday. T oday, there is a heavy layer of malodorous slough on the entire wound surface. The wound has expanded, as well. Electronic Signature(s) Signed: 04/10/2022 11:09:04 AM By: Fredirick Maudlin MD FACS Entered By: Fredirick Maudlin on 04/10/2022 11:09:04 -------------------------------------------------------------------------------- Physical Exam Details Patient Name: Date of Service: Lynn Ito. 04/10/2022 10:00 A M Medical Record Number: [id number] Patient Account Number: [id number] Date of Birth/Sex: Treating RN: December 10, 1960 (61 y.o. Janyth Contes Primary Care Provider: [name] Other Clinician: Referring Provider: Treating Provider/Extender: Ulice Brilliant Weeks in Treatment: 2 Constitutional Hypertensive, asymptomatic. . . . No acute  distress.Marland Kitchen Respiratory Normal work of breathing on room air.. Notes 04/10/2022: Today, there is a heavy layer of malodorous slough on the entire wound surface. The wound has expanded, as well. Electronic Signature(s) Signed: 04/10/2022 11:09:43 AM By: Fredirick Maudlin MD FACS Entered By: Fredirick Maudlin on 04/10/2022 11:09:43 -------------------------------------------------------------------------------- Physician Orders Details Patient Name: Date of Service: Lynn Ito. 04/10/2022 10:00 A M Medical Record Number: [id number] Patient Account Number: [id number] Date of Birth/Sex: Treating RN: May 01, 1961 (61 y.o. Janyth Contes Primary Care Provider: [name] Other Clinician: Referring Provider: Treating Provider/Extender: Alda Lea in Treatment: 2 Verbal / Phone Orders: No Diagnosis Coding ICD-10 Coding Code Description [phone] Non-pressure chronic ulcer of right calf with fat layer exposed I87.311 Chronic venous hypertension (idiopathic) with ulcer of right lower extremity N18.6 End stage renal disease E11.59 Type 2 diabetes mellitus with other circulatory complications Q73.4 Peripheral vascular disease, unspecified I25.10 Atherosclerotic heart disease of native coronary artery without angina pectoris Follow-up Appointments ppointment in 1 week. - Dr Celine Ahr Rm 2 - 7/18 at 3:00 PM Return A Bathing/ Shower/ Hygiene May shower with protection but do not get wound dressing(s) wet. - purchase cast protector from [facility], [facility] or [facility] Edema Control - Lymphedema / SCD / Other Bilateral Lower Extremities Elevate legs to the level of the heart or above for 30 minutes daily and/or when sitting, a frequency of: - 3-4 times a day Avoid standing for long periods of time. Wound Treatment Wound #1 - Lower Leg Wound Laterality: Right Cleanser: Soap and Water 1 x Per Week/30 Days Discharge Instructions: May shower and wash wound  with dial antibacterial soap and water prior to dressing change. Cleanser: Wound Cleanser 1 x Per Week/30 Days Discharge Instructions: Cleanse the wound with wound cleanser prior to applying a clean dressing using gauze sponges, not tissue or cotton balls. Peri-Wound Care: Triamcinolone 15 (g) 1 x Per Week/30 Days Discharge Instructions: Use triamcinolone 15 (g) as directed Peri-Wound Care: Sween Lotion (Moisturizing lotion) 1 x Per Week/30 Days Discharge Instructions: Apply moisturizing lotion as directed Topical: keystone 1 x Per Week/30 Days Prim Dressing: KerraCel Ag Gelling Fiber Dressing, 4x5 in (silver alginate)  1 x Per Week/30 Days ary Discharge Instructions: Apply silver alginate to wound bed as instructed Secondary Dressing: ABD Pad, 8x10 1 x Per Week/30 Days Discharge Instructions: Apply over primary dressing as directed. Compression Wrap: ThreePress (3 layer compression wrap) 1 x Per Week/30 Days Discharge Instructions: Apply three layer compression as directed. Electronic Signature(s) Signed: 04/10/2022 4:27:02 PM By: Fredirick Maudlin MD FACS Entered By: Fredirick Maudlin on 04/10/2022 11:12:56 -------------------------------------------------------------------------------- Problem List Details Patient Name: Date of Service: Lynn Ito. 04/10/2022 10:00 A M Medical Record Number: [id number] Patient Account Number: [id number] Date of Birth/Sex: Treating RN: Oct 26, 1960 (61 y.o. Ernestene Mention Primary Care Provider: [name] Other Clinician: Referring Provider: Treating Provider/Extender: Ulice Brilliant Weeks in Treatment: 2 Active Problems ICD-10 Encounter Code Description Active Date MDM Diagnosis [phone] Non-pressure chronic ulcer of right calf with fat layer exposed 03/21/2022 No Yes I87.311 Chronic venous hypertension (idiopathic) with ulcer of right lower extremity 03/21/2022 No Yes N18.6 End stage renal disease 03/21/2022  No Yes E11.59 Type 2 diabetes mellitus with other circulatory complications 5/99/3570 No Yes I73.9 Peripheral vascular disease, unspecified 03/21/2022 No Yes I25.10 Atherosclerotic heart disease of native coronary artery without angina pectoris 03/21/2022 No Yes Inactive Problems Resolved Problems Electronic Signature(s) Signed: 04/10/2022 11:06:45 AM By: Fredirick Maudlin MD FACS Entered By: Fredirick Maudlin on 04/10/2022 11:06:45 -------------------------------------------------------------------------------- Progress Note Details Patient Name: Date of Service: Lynn Ito. 04/10/2022 10:00 A M Medical Record Number: [id number] Patient Account Number: [id number] Date of Birth/Sex: Treating RN: 02-04-1961 (61 y.o. Janyth Contes Primary Care Provider: [name] Other Clinician: Referring Provider: Treating Provider/Extender: Ulice Brilliant Weeks in Treatment: 2 Subjective Chief Complaint Information obtained from Patient Patient presents for treatment of an open ulcer due to venous insufficiency History of Present Illness (HPI) ADMISSION 03/21/2022 This is a 61 year old type II diabetic with the most recent hemoglobin A1c available to me 6.4 but this is over a-year-old. He has a history of coronary artery disease, end-stage renal disease, peripheral artery disease (status post axillofemoral and Fem-PT bypasses), and venous insufficiency. His most recent vascular studies demonstrated abnormal TBI on the left but with normal ABIs bilaterally and normal TBI on the right. He has been followed at the [facility] wound care center for venous stasis ulcers. They have been using silver alginate and Tubigrip, according to the most recent notes available in the electronic medical record. I am not entirely sure why he decided to change centers and he even says that he has an appointment scheduled there tomorrow but he does not intend to keep it.  Nonetheless, the ulcers on his right leg are healed. On the left, however, he has nearly circumferential exposure of the fat layer just above the ankle. He has woody, tree bark-like skin and it appears that moisture has gotten underneath and into the crevices, resulting in ulceration. There is a strong odor coming from his wound. 03/28/2022: His wounds continue to have a strong fungus-like odor. He apparently did not pick up either the Bactrim or the fluconazole that was prescribed. [employer] was ordered but due to some issues locating a functioning telephone number, he has not received the product. The wounds have a thick layer of slough on them and the woody skin around them is mushy. 04/10/2022: The patient missed his visit last week and therefore his wrap stayed in place for 2 weeks. Upon removal, there was a thick layer of slime and a fairly overwhelming odor. He  did get his [employer] and he has it with him. He grew out multiple species from his culture and therefore Augmentin was added to the Bactrim and fluconazole. He says that he picked up the antibiotics and started taking them yesterday. T oday, there is a heavy layer of malodorous slough on the entire wound surface. The wound has expanded, as well. Patient History Information obtained from Patient. Family History Diabetes - Mother, Heart Disease - Mother,Father, Hypertension - Mother,Father, Kidney Disease - Father, No family history of Cancer, Lung Disease, Seizures, Stroke, Thyroid Problems, Tuberculosis. Social History Smoker, current status unknown - half pack day, Alcohol Use - Moderate, Drug Use - No History, Caffeine Use - Never. 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  No acute distress.. Vitals Time Taken: 10:20 AM, Height: 67 in, Weight: 172 lbs, BMI: 26.9, Temperature: 98.5 F, Pulse: 63 bpm, Respiratory Rate: 18 breaths/min, Blood Pressure: 165/82 mmHg. Respiratory Normal work of breathing on room air.. General Notes: 04/10/2022: Today, there is a heavy layer of malodorous slough on the entire wound surface. The wound has expanded, as well. Integumentary (Hair, Skin) Wound #1 status is Open. Original cause of wound was Gradually Appeared. The date acquired was: 08/31/2021. The wound has been in treatment 2 weeks. The wound is located on the Right Lower Leg. The wound measures 9cm length x 20.5cm width x 0.1cm depth; 144.906cm^2 area and 14.491cm^3 volume. There is Fat Layer (Subcutaneous Tissue) exposed. There is no tunneling or undermining noted. There is a large amount of purulent drainage noted. Foul odor after cleansing was noted. The wound margin is indistinct and nonvisible. There is small (1-33%) pink granulation within the wound bed. There is a large (67-100%) amount of necrotic tissue within the wound bed including Adherent Slough. Assessment Active Problems ICD-10 Non-pressure chronic ulcer of right calf with fat layer exposed Chronic venous hypertension (idiopathic) with ulcer of right lower extremity End stage renal disease Type 2 diabetes mellitus with other circulatory complications Peripheral vascular disease, unspecified Atherosclerotic heart disease of native coronary artery without angina pectoris Procedures Wound #1 Pre-procedure diagnosis of Wound #1 is a Venous Leg Ulcer located on the Right Lower Leg .Severity of Tissue Pre Debridement is: Fat layer exposed. There was a Excisional Skin/Subcutaneous Tissue Debridement with a total area of 184.5 sq cm performed by Fredirick Maudlin, MD. With the following instrument(s): Curette to remove Viable and Non-Viable tissue/material. Material removed includes Subcutaneous Tissue and Slough  and after achieving pain control using Lidocaine 4% T opical Solution. No specimens were taken. A time out was conducted at 10:52, prior to the start of the procedure. A Minimum amount of bleeding was controlled with Pressure. The procedure was tolerated well with a pain level of 0 throughout and a pain level of 0 following the procedure. Post Debridement Measurements: 9cm length x 20.5cm width x 0.1cm depth; 14.491cm^3 volume. Character of Wound/Ulcer Post Debridement is improved. Severity of Tissue Post Debridement is: Fat layer exposed. Post procedure Diagnosis Wound #1: Same as Pre-Procedure Plan Follow-up Appointments: Return Appointment in 1 week. - Dr Celine Ahr Rm 2 - 7/18 at 3:00 PM Bathing/ Shower/ Hygiene: May shower with protection but do not get wound dressing(s) wet. - purchase cast protector from [facility] or [facility] Edema Control - Lymphedema / SCD / Other: Elevate legs to the level of  the heart or above for 30 minutes daily and/or when sitting, a frequency of: - 3-4 times a day Avoid standing for long periods of time. WOUND #1: - Lower Leg Wound Laterality: Right Cleanser: Soap and Water 1 x Per Week/30 Days Discharge Instructions: May shower and wash wound with dial antibacterial soap and water prior to dressing change. Cleanser: Wound Cleanser 1 x Per Week/30 Days Discharge Instructions: Cleanse the wound with wound cleanser prior to applying a clean dressing using gauze sponges, not tissue or cotton balls. Peri-Wound Care: Triamcinolone 15 (g) 1 x Per Week/30 Days Discharge Instructions: Use triamcinolone 15 (g) as directed Peri-Wound Care: Sween Lotion (Moisturizing lotion) 1 x Per Week/30 Days Discharge Instructions: Apply moisturizing lotion as directed Topical: keystone 1 x Per Week/30 Days Prim Dressing: KerraCel Ag Gelling Fiber Dressing, 4x5 in (silver alginate) 1 x Per Week/30 Days ary Discharge Instructions: Apply silver alginate to wound bed as  instructed Secondary Dressing: ABD Pad, 8x10 1 x Per Week/30 Days Discharge Instructions: Apply over primary dressing as directed. Com pression Wrap: ThreePress (3 layer compression wrap) 1 x Per Week/30 Days Discharge Instructions: Apply three layer compression as directed. 04/10/2022: Today, there is a heavy layer of malodorous slough on the entire wound surface. The wound has expanded, as well. I used a curette to thoroughly debride the slough and nonviable subcutaneous tissue from all of the wound surfaces. I also removed more of the woody skin surrounding the wounds that had become mushy with moisture. We will begin using his topical Keystone antibiotic, which includes an antifungal. He should take the Augmentin, Bactrim, and fluconazole that were prescribed. We will use silver alginate and 3 layer compression. He was encouraged to keep his weekly visits to avoid further deterioration of his wounds. I will see him in 1 week's time. Electronic Signature(s) Signed: 04/10/2022 11:14:15 AM By: Fredirick Maudlin MD FACS Entered By: Fredirick Maudlin on 04/10/2022 11:14:15 -------------------------------------------------------------------------------- HxROS Details Patient Name: Date of Service: Lynn Ito. 04/10/2022 10:00 A M Medical Record Number: [id number] Patient Account Number: [id number] Date of Birth/Sex: Treating RN: 10-19-1960 (61 y.o. Janyth Contes Primary Care Provider: [name] Other Clinician: Referring Provider: Treating Provider/Extender: Ulice Brilliant Weeks in Treatment: 2 Information Obtained From Patient Eyes Medical History: Past Medical History Notes: blind in right eye Cardiovascular Medical History: Positive for: Coronary Artery Disease; Hypertension Genitourinary Medical History: Past Medical History Notes: kidney transplant Musculoskeletal Medical History: Positive for: Gout;  Osteoarthritis Immunizations Pneumococcal Vaccine: Received Pneumococcal Vaccination: Yes Received Pneumococcal Vaccination On or After 60th Birthday: Yes Implantable Devices None Family and Social History Cancer: No; Diabetes: Yes - Mother; Heart Disease: Yes - Mother,Father; Hypertension: Yes - Mother,Father; Kidney Disease: Yes - Father; Lung Disease: No; Seizures: No; Stroke: No; Thyroid Problems: No; Tuberculosis: No; Smoker, current status unknown - half pack day; Alcohol Use: Moderate; Drug Use: No History; Caffeine Use: Never; Financial Concerns: No; Food, Clothing or Shelter Needs: No; Support System Lacking: No; Transportation Concerns: No Electronic Signature(s) Signed: 04/10/2022 4:27:02 PM By: Fredirick Maudlin MD FACS Signed: 04/10/2022 4:46:37 PM By: Adline Peals Entered By: Fredirick Maudlin on 04/10/2022 11:09:10 -------------------------------------------------------------------------------- SuperBill Details Patient Name: Date of Service: Lynn Ito. 04/10/2022 Medical Record Number: [id number] Patient Account Number: [id number] Date of Birth/Sex: Treating RN: Jul 09, 1961 (61 y.o. Janyth Contes Primary Care Provider: [name] Other Clinician: Referring Provider: Treating Provider/Extender: Ulice Brilliant Weeks in Treatment: 2 Diagnosis Coding ICD-10 Codes Code Description  R44.315 Non-pressure chronic ulcer of right calf with fat layer exposed I87.311 Chronic venous hypertension (idiopathic) with ulcer of right lower extremity N18.6 End stage renal disease E11.59 Type 2 diabetes mellitus with other circulatory complications Q00.8 Peripheral vascular disease, unspecified I25.10 Atherosclerotic heart disease of native coronary artery without angina pectoris Facility Procedures CPT4 Code: [id number] Description: 32671 - DEB SUBQ TISSUE 20 SQ CM/< ICD-10 Diagnosis Description L97.212 Non-pressure chronic ulcer of  right calf with fat layer exposed Modifier: Quantity: 1 CPT4 Code: [id number] Description: 33825 - DEB SUBQ TISS EA ADDL 20CM ICD-10 Diagnosis Description L97.212 Non-pressure chronic ulcer of right calf with fat layer exposed Modifier: Quantity: 9 Physician Procedures : CPT4 Code Description Modifier [id number] - WC PHYS LEVEL 3 - EST PT 25 ICD-10 Diagnosis Description L97.212 Non-pressure chronic ulcer of right calf with fat layer exposed I87.311 Chronic venous hypertension (idiopathic) with ulcer of right lower  extremity I73.9 Peripheral vascular disease, unspecified E11.59 Type 2 diabetes mellitus with other circulatory complications Quantity: 1 : [id number] 11042 - WC PHYS SUBQ TISS 20 SQ CM ICD-10 Diagnosis Description L97.212 Non-pressure chronic ulcer of right calf with fat layer exposed Quantity: 1 : [id number] 11045 - WC PHYS SUBQ TISS EA ADDL 20 CM ICD-10 Diagnosis Description L97.212 Non-pressure chronic ulcer of right calf with fat layer exposed Quantity: 9 Electronic Signature(s) Signed: 04/10/2022 11:14:42 AM By: Fredirick Maudlin MD FACS Entered By: Fredirick Maudlin on 04/10/2022 11:14:42

## 2022-04-17 ENCOUNTER — Encounter (HOSPITAL_BASED_OUTPATIENT_CLINIC_OR_DEPARTMENT_OTHER): Payer: 59 | Admitting: General Surgery

## 2022-04-17 DIAGNOSIS — I87311 Chronic venous hypertension (idiopathic) with ulcer of right lower extremity: Secondary | ICD-10-CM | POA: Diagnosis not present

## 2022-04-17 NOTE — Progress Notes (Signed)
Theodore Arellano, Theodore Arellano ([id number]) Visit Report for 04/17/2022 Chief Complaint Document Details Patient Name: Date of Service: Theodore Arellano, Theodore Arellano. 04/17/2022 3:00 PM Medical Record Number: [id number] Patient Account Number: [id number] Date of Birth/Sex: Treating RN: 27-Nov-1960 (61 y.o. Janyth Contes Primary Care Provider: [name] Other Clinician: Referring Provider: Treating Provider/Extender: Ulice Brilliant Weeks in Treatment: 3 Information Obtained from: Patient Chief Complaint Patient presents for treatment of an open ulcer due to venous insufficiency Electronic Signature(s) Signed: 04/17/2022 4:00:52 PM By: Fredirick Maudlin MD FACS Entered By: Fredirick Maudlin on 04/17/2022 16:00:51 -------------------------------------------------------------------------------- HPI Details Patient Name: Date of Service: Theodore Arellano. 04/17/2022 3:00 PM Medical Record Number: [id number] Patient Account Number: [id number] Date of Birth/Sex: Treating RN: 01-30-1961 (61 y.o. Janyth Contes Primary Care Provider: [name] Other Clinician: Referring Provider: Treating Provider/Extender: Ulice Brilliant Weeks in Treatment: 3 History of Present Illness HPI Description: ADMISSION 03/21/2022 This is a 61 year old type II diabetic with the most recent hemoglobin A1c available to me 6.4 but this is over a-year-old. He has a history of coronary artery disease, end-stage renal disease, peripheral artery disease (status post axillofemoral and Fem-PT bypasses), and venous insufficiency. His most recent vascular studies demonstrated abnormal TBI on the left but with normal ABIs bilaterally and normal TBI on the right. He has been followed at the [facility] wound care center for venous stasis ulcers. They have been using silver alginate and Tubigrip, according to the most recent notes available in the electronic medical record. I am not  entirely sure why he decided to change centers and he even says that he has an appointment scheduled there tomorrow but he does not intend to keep it. Nonetheless, the ulcers on his right leg are healed. On the left, however, he has nearly circumferential exposure of the fat layer just above the ankle. He has woody, tree bark-like skin and it appears that moisture has gotten underneath and into the crevices, resulting in ulceration. There is a strong odor coming from his wound. 03/28/2022: His wounds continue to have a strong fungus-like odor. He apparently did not pick up either the Bactrim or the fluconazole that was prescribed. [employer] was ordered but due to some issues locating a functioning telephone number, he has not received the product. The wounds have a thick layer of slough on them and the woody skin around them is mushy. 04/10/2022: The patient missed his visit last week and therefore his wrap stayed in place for 2 weeks. Upon removal, there was a thick layer of slime and a fairly overwhelming odor. He did get his [employer] and he has it with him. He grew out multiple species from his culture and therefore Augmentin was added to the Bactrim and fluconazole. He says that he picked up the antibiotics and started taking them yesterday. T oday, there is a heavy layer of malodorous slough on the entire wound surface. The wound has expanded, as well. 04/17/2022: Somewhat amazingly, the majority of his wound seems to have epithelialized over the last week. There is no odor, there is no slough. He has been taking his prescribed antibiotics and we applied his Keystone topical compounded antibiotic to the site last week. The majority of the previously open site is now covered with a layer of epithelium; I am not entirely clear whether or not this represents true healing or if the odd, woody skin that he has has just dried up in the areas that were considered  part of his wound. Electronic  Signature(s) Signed: 04/17/2022 4:05:49 PM By: Fredirick Maudlin MD FACS Entered By: Fredirick Maudlin on 04/17/2022 16:05:49 -------------------------------------------------------------------------------- Physical Exam Details Patient Name: Date of Service: Theodore Arellano. 04/17/2022 3:00 PM Medical Record Number: [id number] Patient Account Number: [id number] Date of Birth/Sex: Treating RN: [birth year] (61 y.o. Janyth Contes Primary Care Provider: [name] Other Clinician: Referring Provider: Treating Provider/Extender: Ulice Brilliant Weeks in Treatment: 3 Constitutional Hypertensive, asymptomatic. . . . No acute distress.Marland Kitchen Respiratory Normal work of breathing on room air.. Notes 04/17/2022: Somewhat amazingly, the majority of his wound seems to have epithelialized over the last week. There is no odor, there is no slough. The majority of the previously open site is now covered with a layer of epithelium; I am not entirely clear whether or not this represents true healing or if the odd, woody skin that he has has just dried up in the areas that we were considering part of his wound. Electronic Signature(s) Signed: 04/17/2022 4:07:21 PM By: Fredirick Maudlin MD FACS Entered By: Fredirick Maudlin on 04/17/2022 16:07:20 -------------------------------------------------------------------------------- Physician Orders Details Patient Name: Date of Service: Theodore Arellano. 04/17/2022 3:00 PM Medical Record Number: [id number] Patient Account Number: [id number] Date of Birth/Sex: Treating RN: 20-Oct-1960 (61 y.o. Janyth Contes Primary Care Provider: [name] Other Clinician: Referring Provider: Treating Provider/Extender: Alda Lea in Treatment: 3 Verbal / Phone Orders: No Diagnosis Coding ICD-10 Coding Code Description [phone] Non-pressure chronic ulcer of right calf with fat layer  exposed I87.311 Chronic venous hypertension (idiopathic) with ulcer of right lower extremity N18.6 End stage renal disease E11.59 Type 2 diabetes mellitus with other circulatory complications H37.1 Peripheral vascular disease, unspecified I25.10 Atherosclerotic heart disease of native coronary artery without angina pectoris Follow-up Appointments ppointment in 1 week. - Dr Celine Ahr Rm 2 - 7/26 at 3:45 PM Return A Bathing/ Shower/ Hygiene May shower with protection but do not get wound dressing(s) wet. - purchase cast protector from [facility], [facility] or [facility] Edema Control - Lymphedema / SCD / Other Bilateral Lower Extremities Elevate legs to the level of the heart or above for 30 minutes daily and/or when sitting, a frequency of: - 3-4 times a day Avoid standing for long periods of time. Wound Treatment Wound #1 - Lower Leg Wound Laterality: Right Cleanser: Soap and Water 1 x Per Week/30 Days Discharge Instructions: May shower and wash wound with dial antibacterial soap and water prior to dressing change. Cleanser: Wound Cleanser 1 x Per Week/30 Days Discharge Instructions: Cleanse the wound with wound cleanser prior to applying a clean dressing using gauze sponges, not tissue or cotton balls. Peri-Wound Care: Triamcinolone 15 (g) 1 x Per Week/30 Days Discharge Instructions: Use triamcinolone 15 (g) as directed Peri-Wound Care: Sween Lotion (Moisturizing lotion) 1 x Per Week/30 Days Discharge Instructions: Apply moisturizing lotion as directed Topical: keystone 1 x Per Week/30 Days Prim Dressing: KerraCel Ag Gelling Fiber Dressing, 4x5 in (silver alginate) 1 x Per Week/30 Days ary Discharge Instructions: Apply silver alginate to wound bed as instructed Secondary Dressing: ABD Pad, 8x10 1 x Per Week/30 Days Discharge Instructions: Apply over primary dressing as directed. Compression Wrap: ThreePress (3 layer compression wrap) 1 x Per Week/30 Days Discharge Instructions: Apply three layer  compression as directed. Electronic Signature(s) Signed: 04/17/2022 4:11:38 PM By: Fredirick Maudlin MD FACS Entered By: Fredirick Maudlin on 04/17/2022 16:11:37 -------------------------------------------------------------------------------- Problem List Details Patient Name: Date of Service: Theodore Arellano, Theodore Aho.  04/17/2022 3:00 PM Medical Record Number: [id number] Patient Account Number: [id number] Date of Birth/Sex: Treating RN: [birth year] (61 y.o. Janyth Contes Primary Care Provider: [name] Other Clinician: Referring Provider: Treating Provider/Extender: Ulice Brilliant Weeks in Treatment: 3 Active Problems ICD-10 Encounter Code Description Active Date MDM Diagnosis [phone] Non-pressure chronic ulcer of right calf with fat layer exposed 03/21/2022 No Yes I87.311 Chronic venous hypertension (idiopathic) with ulcer of right lower extremity 03/21/2022 No Yes N18.6 End stage renal disease 03/21/2022 No Yes E11.59 Type 2 diabetes mellitus with other circulatory complications 0/25/4270 No Yes I73.9 Peripheral vascular disease, unspecified 03/21/2022 No Yes I25.10 Atherosclerotic heart disease of native coronary artery without angina pectoris 03/21/2022 No Yes Inactive Problems Resolved Problems Electronic Signature(s) Signed: 04/17/2022 4:00:23 PM By: Fredirick Maudlin MD FACS Entered By: Fredirick Maudlin on 04/17/2022 16:00:23 -------------------------------------------------------------------------------- Progress Note Details Patient Name: Date of Service: Theodore Arellano. 04/17/2022 3:00 PM Medical Record Number: [id number] Patient Account Number: [id number] Date of Birth/Sex: Treating RN: [birth year] (61 y.o. Janyth Contes Primary Care Provider: [name] Other Clinician: Referring Provider: Treating Provider/Extender: Ulice Brilliant Weeks in Treatment: 3 Subjective Chief Complaint Information  obtained from Patient Patient presents for treatment of an open ulcer due to venous insufficiency History of Present Illness (HPI) ADMISSION 03/21/2022 This is a 61 year old type II diabetic with the most recent hemoglobin A1c available to me 6.4 but this is over a-year-old. He has a history of coronary artery disease, end-stage renal disease, peripheral artery disease (status post axillofemoral and Fem-PT bypasses), and venous insufficiency. His most recent vascular studies demonstrated abnormal TBI on the left but with normal ABIs bilaterally and normal TBI on the right. He has been followed at the [facility] wound care center for venous stasis ulcers. They have been using silver alginate and Tubigrip, according to the most recent notes available in the electronic medical record. I am not entirely sure why he decided to change centers and he even says that he has an appointment scheduled there tomorrow but he does not intend to keep it. Nonetheless, the ulcers on his right leg are healed. On the left, however, he has nearly circumferential exposure of the fat layer just above the ankle. He has woody, tree bark-like skin and it appears that moisture has gotten underneath and into the crevices, resulting in ulceration. There is a strong odor coming from his wound. 03/28/2022: His wounds continue to have a strong fungus-like odor. He apparently did not pick up either the Bactrim or the fluconazole that was prescribed. [employer] was ordered but due to some issues locating a functioning telephone number, he has not received the product. The wounds have a thick layer of slough on them and the woody skin around them is mushy. 04/10/2022: The patient missed his visit last week and therefore his wrap stayed in place for 2 weeks. Upon removal, there was a thick layer of slime and a fairly overwhelming odor. He did get his [employer] and he has it with him. He grew out multiple species from his culture and  therefore Augmentin was added to the Bactrim and fluconazole. He says that he picked up the antibiotics and started taking them yesterday. T oday, there is a heavy layer of malodorous slough on the entire wound surface. The wound has expanded, as well. 04/17/2022: Somewhat amazingly, the majority of his wound seems to have epithelialized over the last week. There is no odor, there is no slough. He  has been taking his prescribed antibiotics and we applied his Keystone topical compounded antibiotic to the site last week. The majority of the previously open site is now covered with a layer of epithelium; I am not entirely clear whether or not this represents true healing or if the odd, woody skin that he has has just dried up in the areas that were considered part of his wound. Patient History Information obtained from Patient. Family History Diabetes - Mother, Heart Disease - Mother,Father, Hypertension - Mother,Father, Kidney Disease - Father, No family history of Cancer, Lung Disease, Seizures, Stroke, Thyroid Problems, Tuberculosis. Social History Smoker, current status unknown - half pack day, Alcohol Use - Moderate, Drug Use - No History, Caffeine Use - Never. 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 No acute distress.. Vitals Time Taken: 3:19 PM, Height: 67 in, Weight: 172 lbs, BMI: 26.9, Temperature: 98.4 F, Pulse: 64 bpm, Respiratory Rate: 18 breaths/min, Blood Pressure: 168/92 mmHg. Respiratory Normal work of breathing on room air.. General Notes: 04/17/2022: Somewhat amazingly, the majority of his wound seems to have epithelialized over the last week. There is no odor, there is no slough. The majority of the previously open  site is now covered with a layer of epithelium; I am not entirely clear whether or not this represents true healing or if the odd, woody skin that he has has just dried up in the areas that we were considering part of his wound. Integumentary (Hair, Skin) Wound #1 status is Open. Original cause of wound was Gradually Appeared. The date acquired was: 08/31/2021. The wound has been in treatment 3 weeks. The wound is located on the Right Lower Leg. The wound measures 0.1cm length x 0.1cm width x 0.1cm depth; 0.008cm^2 area and 0.001cm^3 volume. There is Fat Layer (Subcutaneous Tissue) exposed. There is no tunneling or undermining noted. There is a medium amount of serosanguineous drainage noted. The wound margin is indistinct and nonvisible. There is small (1-33%) pink granulation within the wound bed. There is no necrotic tissue within the wound bed. Assessment Active Problems ICD-10 Non-pressure chronic ulcer of right calf with fat layer exposed Chronic venous hypertension (idiopathic) with ulcer of right lower extremity End stage renal disease Type 2 diabetes mellitus with other circulatory complications Peripheral vascular disease, unspecified Atherosclerotic heart disease of native coronary artery without angina pectoris Plan Follow-up Appointments: Return Appointment in 1 week. - Dr Celine Ahr Rm 2 - 7/26 at 3:45 PM Bathing/ Shower/ Hygiene: May shower with protection but do not get wound dressing(s) wet. - purchase cast protector from [facility], [place] or [facility] Edema Control - Lymphedema / SCD / Other: Elevate legs to the level of the heart or above for 30 minutes daily and/or when sitting, a frequency of: - 3-4 times a day Avoid standing for long periods of time. WOUND #1: - Lower Leg Wound Laterality: Right Cleanser: Soap and Water 1 x Per Week/30 Days Discharge Instructions: May shower and wash wound with dial antibacterial soap and water prior to dressing change. Cleanser: Wound Cleanser  1 x Per Week/30 Days Discharge Instructions: Cleanse the wound with wound cleanser prior to applying a clean dressing using gauze sponges, not tissue or cotton balls. Peri-Wound Care: Triamcinolone 15 (g) 1 x Per Week/30 Days Discharge Instructions: Use triamcinolone 15 (g) as directed Peri-Wound Care: Sween Lotion (Moisturizing lotion)  1 x Per Week/30 Days Discharge Instructions: Apply moisturizing lotion as directed Topical: keystone 1 x Per Week/30 Days Prim Dressing: KerraCel Ag Gelling Fiber Dressing, 4x5 in (silver alginate) 1 x Per Week/30 Days ary Discharge Instructions: Apply silver alginate to wound bed as instructed Secondary Dressing: ABD Pad, 8x10 1 x Per Week/30 Days Discharge Instructions: Apply over primary dressing as directed. Com pression Wrap: ThreePress (3 layer compression wrap) 1 x Per Week/30 Days Discharge Instructions: Apply three layer compression as directed. 04/17/2022: Somewhat amazingly, the majority of his wound seems to have epithelialized over the last week. There is no odor, there is no slough. The majority of the previously open site is now covered with a layer of epithelium; I am not entirely clear whether or not this represents true healing or if the odd, woody skin that he has has just dried up in the areas that we were considering part of his wound. I did not debride the wound today. I am a little bit perplexed as to how this could have closed so quickly given its massive deterioration at the last visit. I am going to a apply his treatment of topical Keystone antibiotic, silver alginate, and 3 layer compression again this week. He should complete his course of antibiotics. I anticipate that we will be able to release him after next week, assuming the healing observed today holds up. I will see him then. Electronic Signature(s) Signed: 04/17/2022 4:13:15 PM By: Fredirick Maudlin MD FACS Entered By: Fredirick Maudlin on 04/17/2022  16:13:15 -------------------------------------------------------------------------------- HxROS Details Patient Name: Date of Service: Theodore Arellano. 04/17/2022 3:00 PM Medical Record Number: [id number] Patient Account Number: [id number] Date of Birth/Sex: Treating RN: 11-11-1960 (61 y.o. Janyth Contes Primary Care Provider: [name] Other Clinician: Referring Provider: Treating Provider/Extender: Alda Lea in Treatment: 3 Information Obtained From Patient Eyes Medical History: Past Medical History Notes: blind in right eye Cardiovascular Medical History: Positive for: Coronary Artery Disease; Hypertension Genitourinary Medical History: Past Medical History Notes: kidney transplant Musculoskeletal Medical History: Positive for: Gout; Osteoarthritis Immunizations Pneumococcal Vaccine: Received Pneumococcal Vaccination: Yes Received Pneumococcal Vaccination On or After 60th Birthday: Yes Implantable Devices None Family and Social History Cancer: No; Diabetes: Yes - Mother; Heart Disease: Yes - Mother,Father; Hypertension: Yes - Mother,Father; Kidney Disease: Yes - Father; Lung Disease: No; Seizures: No; Stroke: No; Thyroid Problems: No; Tuberculosis: No; Smoker, current status unknown - half pack day; Alcohol Use: Moderate; Drug Use: No History; Caffeine Use: Never; Financial Concerns: No; Food, Clothing or Shelter Needs: No; Support System Lacking: No; Transportation Concerns: No Electronic Signature(s) Signed: 04/17/2022 4:14:01 PM By: Fredirick Maudlin MD FACS Signed: 04/17/2022 5:25:46 PM By: Adline Peals Entered By: Fredirick Maudlin on 04/17/2022 16:06:27 -------------------------------------------------------------------------------- SuperBill Details Patient Name: Date of Service: Theodore Arellano. 04/17/2022 Medical Record Number: [id number] Patient Account Number: [id number] Date of Birth/Sex: Treating  RN: 09/16/1961 (61 y.o. 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 B76.2 Peripheral vascular disease, unspecified I25.10 Atherosclerotic heart disease of native coronary artery without angina pectoris Physician Procedures : CPT4 Code Description Modifier [id number] - WC PHYS LEVEL 4 - EST PT  ICD-10 Diagnosis Description L97.212 Non-pressure chronic ulcer of right calf with fat layer exposed I87.311 Chronic venous hypertension (idiopathic) with ulcer of right lower  extremity I73.9 Peripheral vascular disease, unspecified N18.6 End stage renal disease Quantity: 1 Electronic Signature(s) Signed: 04/17/2022 4:13:38 PM By: Fredirick Maudlin MD FACS Entered By: Fredirick Maudlin on 04/17/2022 16:13:37

## 2022-04-17 NOTE — Progress Notes (Addendum)
Theodore Arellano, Theodore Arellano ([id number]) Visit Report for 04/17/2022 Arrival Information Details Patient Name: Date of Service: Theodore Arellano, Theodore Arellano. 04/17/2022 3:00 PM Medical Record Number: [id number] Patient Account Number: [id number] Date of Birth/Sex: Treating RN: 1960-11-18 (61 y.o. Flossie Buffy, Lovena Le Primary Care Jazzmine Kleiman: [name] Other Clinician: Referring Labib Cwynar: Treating Kollyn Lingafelter/Extender: Alda Lea in Treatment: 3 Visit Information History Since Last Visit Added or deleted any medications: No Patient Arrived: Ambulatory Any new allergies or adverse reactions: No Arrival Time: 15:18 Had a fall or experienced change in No Accompanied By: self activities of daily living that may affect Transfer Assistance: None risk of falls: Patient Identification Verified: Yes Signs or symptoms of abuse/neglect since last visito No Secondary Verification Process Completed: Yes Hospitalized since last visit: No Patient Has Alerts: Yes Implantable device outside of the clinic excluding No cellular tissue based products placed in the center since last visit: Has Dressing in Place as Prescribed: Yes Has Compression in Place as Prescribed: Yes Pain Present Now: No Electronic Signature(s) Signed: 04/17/2022 4:17:28 PM By: Erenest Blank Entered By: Erenest Blank on 04/17/2022 15:19:26 -------------------------------------------------------------------------------- Compression Therapy Details Patient Name: Date of Service: Theodore Arellano. 04/17/2022 3:00 PM Medical Record Number: [id number] Patient Account Number: [id number] Date of Birth/Sex: Treating RN: [birth year] (61 y.o. Hessie Diener Primary Care Consuello Lassalle: [name] Other Clinician: Referring Dierra Riesgo: Treating Rache Klimaszewski/Extender: Ulice Brilliant Weeks in Treatment: 3 Compression Therapy Performed for Wound Assessment: Wound #1 Right Lower Leg Performed By:  Clinician Adline Peals, RN Compression Type: Three Layer Post Procedure Diagnosis Same as Pre-procedure Electronic Signature(s) Signed: 04/28/2022 12:37:29 PM By: Deon Pilling RN, BSN Entered By: Deon Pilling on 04/28/2022 10:40:34 -------------------------------------------------------------------------------- Encounter Discharge Information Details Patient Name: Date of Service: Theodore Arellano. 04/17/2022 3:00 PM Medical Record Number: [id number] Patient Account Number: [id number] Date of Birth/Sex: Treating RN: 04-Mar-1961 (61 y.o. Theodore Arellano Primary Care Triston Skare: [name] Other Clinician: Referring Farrell Pantaleo: Treating Siana Panameno/Extender: Alda Lea in Treatment: 3 Encounter Discharge Information Items Discharge Condition: Stable Ambulatory Status: Ambulatory Discharge Destination: Home Transportation: Private Auto Accompanied By: self Schedule Follow-up Appointment: Yes Clinical Summary of Care: Patient Declined Electronic Signature(s) Signed: 04/17/2022 5:25:46 PM By: Adline Peals Entered By: Adline Peals on 04/17/2022 16:36:28 -------------------------------------------------------------------------------- Lower Extremity Assessment Details Patient Name: Date of Service: Theodore Arellano. 04/17/2022 3:00 PM Medical Record Number: [id number] Patient Account Number: [id number] Date of Birth/Sex: Treating RN: 01/05/1961 (61 y.o. Theodore Arellano Primary Care Cristin Penaflor: [name] Other Clinician: Referring Keymari Sato: Treating Bodhi Stenglein/Extender: Ulice Brilliant Weeks in Treatment: 3 Edema Assessment Assessed: [Left: No] [Right: No] E[Left: dema] [Right: :] Calf Left: Right: Point of Measurement: From Medial Instep 30.5 cm Ankle Left: Right: Point of Measurement: From Medial Instep 23.4 cm Vascular Assessment Pulses: Dorsalis Pedis Palpable:  [Right:Yes] Electronic Signature(s) Signed: 04/17/2022 4:17:28 PM By: Erenest Blank Signed: 04/17/2022 5:25:46 PM By: Adline Peals Entered By: Erenest Blank on 04/17/2022 15:32:32 -------------------------------------------------------------------------------- Multi Wound Chart Details Patient Name: Date of Service: Theodore Arellano. 04/17/2022 3:00 PM Medical Record Number: [id number] Patient Account Number: [id number] Date of Birth/Sex: Treating RN: [birth year] (61 y.o. Theodore Arellano Primary Care Severina Sykora: [name] Other Clinician: Referring Breanne Olvera: Treating Ezella Kell/Extender: Ulice Brilliant Weeks in Treatment: 3 Vital Signs Height(in): 67 Pulse(bpm): 64 Weight(lbs): 172 Blood Pressure(mmHg): 168/92 Body Mass Index(BMI): 26.9 Temperature(F): 98.4 Respiratory Rate(breaths/min): 18 Photos: [N/A:N/A] Right Lower Leg N/A  N/A Wound Location: Gradually Appeared N/A N/A Wounding Event: Venous Leg Ulcer N/A N/A Primary Etiology: Coronary Artery Disease, N/A N/A Comorbid History: Hypertension, Gout, Osteoarthritis 08/31/2021 N/A N/A Date Acquired: 3 N/A N/A Weeks of Treatment: Open N/A N/A Wound Status: No N/A N/A Wound Recurrence: 0.1x0.1x0.1 N/A N/A Measurements L x W x D (cm) 0.008 N/A N/A A (cm) : rea 0.001 N/A N/A Volume (cm) : 100.00% N/A N/A % Reduction in Area: 100.00% N/A N/A % Reduction in Volume: Full Thickness Without Exposed N/A N/A Classification: Support Structures Medium N/A N/A Exudate Amount: Serosanguineous N/A N/A Exudate Type: red, Shark N/A N/A Exudate Color: Indistinct, nonvisible N/A N/A Wound Margin: Small (1-33%) N/A N/A Granulation Amount: Pink N/A N/A Granulation Quality: None Present (0%) N/A N/A Necrotic Amount: Fat Layer (Subcutaneous Tissue): Yes N/A N/A Exposed Structures: Fascia: No Tendon: No Muscle: No Joint: No Bone: No Large (67-100%) N/A  N/A Epithelialization: Treatment Notes Electronic Signature(s) Signed: 04/17/2022 4:00:34 PM By: Fredirick Maudlin MD FACS Signed: 04/17/2022 5:25:46 PM By: Adline Peals Entered By: Fredirick Maudlin on 04/17/2022 16:00:34 -------------------------------------------------------------------------------- Multi-Disciplinary Care Plan Details Patient Name: Date of Service: Theodore Arellano. 04/17/2022 3:00 PM Medical Record Number: [id number] Patient Account Number: [id number] Date of Birth/Sex: Treating RN: 10/04/1960 (61 y.o. Theodore Arellano Primary Care Shadi Sessler: [name] Other Clinician: Referring Mikell Kazlauskas: Treating Juanantonio Stolar/Extender: Ulice Brilliant Weeks in Treatment: 3 Active Inactive Nutrition Nursing Diagnoses: Potential for alteratiion in Nutrition/Potential for imbalanced nutrition Goals: Patient/caregiver agrees to and verbalizes understanding of need to use nutritional supplements and/or vitamins as prescribed Date Initiated: 03/21/2022 Target Resolution Date: 04/26/2022 Goal Status: Active Interventions: Provide education on nutrition Notes: Venous Leg Ulcer Nursing Diagnoses: Knowledge deficit related to disease process and management Goals: Patient/caregiver will verbalize understanding of disease process and disease management Date Initiated: 03/21/2022 Target Resolution Date: 04/26/2022 Goal Status: Active Interventions: Assess peripheral edema status every visit. Compression as ordered Notes: Wound/Skin Impairment Nursing Diagnoses: Knowledge deficit related to smoking impact on wound healing Knowledge deficit related to ulceration/compromised skin integrity Goals: Patient/caregiver will verbalize understanding of skin care regimen Date Initiated: 03/21/2022 Target Resolution Date: 04/26/2022 Goal Status: Active Ulcer/skin breakdown will have a volume reduction of 30% by week 4 Date Initiated: 03/21/2022 Target  Resolution Date: 04/26/2022 Goal Status: Active Interventions: Assess patient/caregiver ability to obtain necessary supplies Assess patient/caregiver ability to perform ulcer/skin care regimen upon admission and as needed Assess ulceration(s) every visit Provide education on smoking Notes: Electronic Signature(s) Signed: 04/17/2022 4:17:28 PM By: Erenest Blank Signed: 04/17/2022 5:25:46 PM By: Adline Peals Entered By: Erenest Blank on 04/17/2022 15:29:56 -------------------------------------------------------------------------------- Pain Assessment Details Patient Name: Date of Service: Theodore Arellano. 04/17/2022 3:00 PM Medical Record Number: [id number] Patient Account Number: [id number] Date of Birth/Sex: Treating RN: 1961-02-02 (61 y.o. Theodore Arellano Primary Care Hattie Aguinaldo: [name] Other Clinician: Referring Farid Grigorian: Treating Kimora Stankovic/Extender: Ulice Brilliant Weeks in Treatment: 3 Active Problems Location of Pain Severity and Description of Pain Patient Has Paino No Site Locations Pain Management and Medication Current Pain Management: Electronic Signature(s) Signed: 04/17/2022 4:17:28 PM By: Erenest Blank Signed: 04/17/2022 5:25:46 PM By: Adline Peals Entered By: Erenest Blank on 04/17/2022 15:19:53 -------------------------------------------------------------------------------- Patient/Caregiver Education Details Patient Name: Date of Service: Theodore Arellano 7/18/2023andnbsp3:00 PM Medical Record Number: [id number] Patient Account Number: [id number] Date of Birth/Gender: Treating RN: 1961-02-15 (61 y.o. Theodore Arellano Primary Care Physician: [name] Other Clinician: Referring Physician: Treating Physician/Extender: Rogelia Rohrer  Algis Greenhouse in Treatment: 3 Education Assessment Education Provided To: Patient Education Topics Provided Wound/Skin  Impairment: Methods: Explain/Verbal Responses: Reinforcements needed, State content correctly Electronic Signature(s) Signed: 04/17/2022 4:17:28 PM By: Erenest Blank Entered By: Erenest Blank on 04/17/2022 15:30:04 -------------------------------------------------------------------------------- Wound Assessment Details Patient Name: Date of Service: Theodore Arellano. 04/17/2022 3:00 PM Medical Record Number: [id number] Patient Account Number: [id number] Date of Birth/Sex: Treating RN: Feb 13, 1961 (60 y.o. Theodore Arellano Primary Care Sephira Zellman: [name] Other Clinician: Referring Cung Masterson: Treating Denecia Brunette/Extender: Ulice Brilliant Weeks in Treatment: 3 Wound Status Wound Number: 1 Primary Etiology: Venous Leg Ulcer Wound Location: Right Lower Leg Wound Status: Open Wounding Event: Gradually Appeared Comorbid Coronary Artery Disease, Hypertension, Gout, History: Osteoarthritis Date Acquired: 08/31/2021 Weeks Of Treatment: 3 Clustered Wound: No Photos Wound Measurements Length: (cm) 0.1 Width: (cm) 0.1 Depth: (cm) 0.1 Area: (cm) 0.008 Volume: (cm) 0.001 % Reduction in Area: 100% % Reduction in Volume: 100% Epithelialization: Large (67-100%) Tunneling: No Undermining: No Wound Description Classification: Full Thickness Without Exposed Support Structures Wound Margin: Indistinct, nonvisible Exudate Amount: Medium Exudate Type: Serosanguineous Exudate Color: red, Nevils Foul Odor After Cleansing: No Slough/Fibrino Yes Wound Bed Granulation Amount: Small (1-33%) Exposed Structure Granulation Quality: Pink Fascia Exposed: No Necrotic Amount: None Present (0%) Fat Layer (Subcutaneous Tissue) Exposed: Yes Tendon Exposed: No Muscle Exposed: No Joint Exposed: No Bone Exposed: No Treatment Notes Wound #1 (Lower Leg) Wound Laterality: Right Cleanser Soap and Water Discharge Instruction: May shower and wash wound with dial  antibacterial soap and water prior to dressing change. Wound Cleanser Discharge Instruction: Cleanse the wound with wound cleanser prior to applying a clean dressing using gauze sponges, not tissue or cotton balls. Peri-Wound Care Triamcinolone 15 (g) Discharge Instruction: Use triamcinolone 15 (g) as directed Sween Lotion (Moisturizing lotion) Discharge Instruction: Apply moisturizing lotion as directed Topical keystone Primary Dressing KerraCel Ag Gelling Fiber Dressing, 4x5 in (silver alginate) Discharge Instruction: Apply silver alginate to wound bed as instructed Secondary Dressing ABD Pad, 8x10 Discharge Instruction: Apply over primary dressing as directed. Secured With Compression Wrap ThreePress (3 layer compression wrap) Discharge Instruction: Apply three layer compression as directed. Compression Stockings Add-Ons Electronic Signature(s) Signed: 04/17/2022 5:25:46 PM By: Adline Peals Entered By: Adline Peals on 04/17/2022 15:31:17 -------------------------------------------------------------------------------- Vitals Details Patient Name: Date of Service: Theodore Arellano. 04/17/2022 3:00 PM Medical Record Number: [id number] Patient Account Number: [id number] Date of Birth/Sex: Treating RN: [birth year] (61 y.o. Theodore Arellano Primary Care Lasheka Kempner: [name] Other Clinician: Referring Amando Chaput: Treating Kdyn Vonbehren/Extender: Ulice Brilliant Weeks in Treatment: 3 Vital Signs Time Taken: 15:19 Temperature (F): 98.4 Height (in): 67 Pulse (bpm): 64 Weight (lbs): 172 Respiratory Rate (breaths/min): 18 Body Mass Index (BMI): 26.9 Blood Pressure (mmHg): 168/92 Reference Range: 80 - 120 mg / dl Electronic Signature(s) Signed: 04/17/2022 4:17:28 PM By: Erenest Blank Entered By: Erenest Blank on 04/17/2022 15:19:47

## 2022-04-25 ENCOUNTER — Encounter (HOSPITAL_BASED_OUTPATIENT_CLINIC_OR_DEPARTMENT_OTHER): Payer: 59 | Admitting: General Surgery

## 2022-05-07 ENCOUNTER — Encounter (HOSPITAL_BASED_OUTPATIENT_CLINIC_OR_DEPARTMENT_OTHER): Payer: 59 | Attending: General Surgery | Admitting: General Surgery

## 2022-05-29 ENCOUNTER — Encounter (HOSPITAL_BASED_OUTPATIENT_CLINIC_OR_DEPARTMENT_OTHER): Payer: 59 | Attending: General Surgery | Admitting: General Surgery

## 2022-05-29 DIAGNOSIS — E1151 Type 2 diabetes mellitus with diabetic peripheral angiopathy without gangrene: Secondary | ICD-10-CM | POA: Insufficient documentation

## 2022-05-29 DIAGNOSIS — N186 End stage renal disease: Secondary | ICD-10-CM | POA: Insufficient documentation

## 2022-05-29 DIAGNOSIS — Z09 Encounter for follow-up examination after completed treatment for conditions other than malignant neoplasm: Secondary | ICD-10-CM | POA: Insufficient documentation

## 2022-05-29 DIAGNOSIS — I2510 Atherosclerotic heart disease of native coronary artery without angina pectoris: Secondary | ICD-10-CM | POA: Insufficient documentation

## 2022-05-29 DIAGNOSIS — L859 Epidermal thickening, unspecified: Secondary | ICD-10-CM | POA: Insufficient documentation

## 2022-05-29 DIAGNOSIS — I87301 Chronic venous hypertension (idiopathic) without complications of right lower extremity: Secondary | ICD-10-CM | POA: Insufficient documentation

## 2022-05-29 DIAGNOSIS — E1122 Type 2 diabetes mellitus with diabetic chronic kidney disease: Secondary | ICD-10-CM | POA: Diagnosis not present

## 2022-05-29 DIAGNOSIS — Z8631 Personal history of diabetic foot ulcer: Secondary | ICD-10-CM | POA: Insufficient documentation

## 2022-05-29 NOTE — Progress Notes (Signed)
ZEBEDIAH, BEEZLEY ([id number]) Visit Report for 05/29/2022 Chief Complaint Document Details Patient Name: Date of Service: Theodore Arellano, Theodore Arellano. 05/29/2022 8:30 A M Medical Record Number: [id number] Patient Account Number: [id number] Date of Birth/Sex: Treating RN: 1961/05/17 (61 y.o. Theodore Arellano Primary Care Provider: [name] Other Clinician: Referring Provider: Treating Provider/Extender: Ulice Brilliant Weeks in Treatment: 9 Information Obtained from: Patient Chief Complaint Patient presents for treatment of an open ulcer due to venous insufficiency Electronic Signature(s) Signed: 05/29/2022 8:42:34 AM By: Fredirick Maudlin MD FACS Entered By: Fredirick Maudlin on 05/29/2022 08:42:34 -------------------------------------------------------------------------------- HPI Details Patient Name: Date of Service: Theodore Arellano. 05/29/2022 8:30 A M Medical Record Number: [id number] Patient Account Number: [id number] Date of Birth/Sex: Treating RN: 07-20-1961 (61 y.o. Theodore Arellano Primary Care Provider: [name] Other Clinician: Referring Provider: Treating Provider/Extender: Ulice Brilliant Weeks in Treatment: 9 History of Present Illness HPI Description: ADMISSION 03/21/2022 This is a 61 year old type II diabetic with the most recent hemoglobin A1c available to me 6.4 but this is over a-year-old. He has a history of coronary artery disease, end-stage renal disease, peripheral artery disease (status post axillofemoral and Fem-PT bypasses), and venous insufficiency. His most recent vascular studies demonstrated abnormal TBI on the left but with normal ABIs bilaterally and normal TBI on the right. He has been followed at the [facility] wound care center for venous stasis ulcers. They have been using silver alginate and Tubigrip, according to the most recent notes available in the electronic medical record. I am  not entirely sure why he decided to change centers and he even says that he has an appointment scheduled there tomorrow but he does not intend to keep it. Nonetheless, the ulcers on his right leg are healed. On the left, however, he has nearly circumferential exposure of the fat layer just above the ankle. He has woody, tree bark-like skin and it appears that moisture has gotten underneath and into the crevices, resulting in ulceration. There is a strong odor coming from his wound. 03/28/2022: His wounds continue to have a strong fungus-like odor. He apparently did not pick up either the Bactrim or the fluconazole that was prescribed. [employer] was ordered but due to some issues locating a functioning telephone number, he has not received the product. The wounds have a thick layer of slough on them and the woody skin around them is mushy. 04/10/2022: The patient missed his visit last week and therefore his wrap stayed in place for 2 weeks. Upon removal, there was a thick layer of slime and a fairly overwhelming odor. He did get his [employer] and he has it with him. He grew out multiple species from his culture and therefore Augmentin was added to the Bactrim and fluconazole. He says that he picked up the antibiotics and started taking them yesterday. T oday, there is a heavy layer of malodorous slough on the entire wound surface. The wound has expanded, as well. 04/17/2022: Somewhat amazingly, the majority of his wound seems to have epithelialized over the last week. There is no odor, there is no slough. He has been taking his prescribed antibiotics and we applied his Keystone topical compounded antibiotic to the site last week. The majority of the previously open site is now covered with a layer of epithelium; I am not entirely clear whether or not this represents true healing or if the odd, woody skin that he has has just dried up in the areas that  were considered part of his wound. 05/29/2022: The  patient returns after 6-week absence. He has no open wounds at this time. He does have hyperkeratotic skin on his lower leg but no areas of moisture and there is no odor. Electronic Signature(s) Signed: 05/29/2022 8:43:15 AM By: Fredirick Maudlin MD FACS Entered By: Fredirick Maudlin on 05/29/2022 08:43:15 -------------------------------------------------------------------------------- Physical Exam Details Patient Name: Date of Service: Theodore Arellano. 05/29/2022 8:30 A M Medical Record Number: [id number] Patient Account Number: [id number] Date of Birth/Sex: Treating RN: May 03, 1961 (61 y.o. Theodore Arellano Primary Care Provider: [name] Other Clinician: Referring Provider: Treating Provider/Extender: Ulice Brilliant Weeks in Treatment: 9 Constitutional Hypertensive, asymptomatic. . . . No acute distress.Marland Kitchen Respiratory Normal work of breathing on room air.. Notes 05/29/2022: The patient has no open wounds at this time. He does have thick hyperkeratotic skin on his lower extremity, however. Electronic Signature(s) Signed: 05/29/2022 8:44:01 AM By: Fredirick Maudlin MD FACS Entered By: Fredirick Maudlin on 05/29/2022 08:44:01 -------------------------------------------------------------------------------- Physician Orders Details Patient Name: Date of Service: Theodore Arellano. 05/29/2022 8:30 A M Medical Record Number: [id number] Patient Account Number: [id number] Date of Birth/Sex: Treating RN: 1961-02-26 (61 y.o. Theodore Arellano Primary Care Provider: [name] Other Clinician: Referring Provider: Treating Provider/Extender: Alda Lea in Treatment: 9 Verbal / Phone Orders: No Diagnosis Coding ICD-10 Coding Code Description [phone] Non-pressure chronic ulcer of right calf with fat layer exposed I87.311 Chronic venous hypertension (idiopathic) with ulcer of right lower extremity N18.6  End stage renal disease E11.59 Type 2 diabetes mellitus with other circulatory complications Y81.4 Peripheral vascular disease, unspecified I25.10 Atherosclerotic heart disease of native coronary artery without angina pectoris Discharge From [facility] Services Discharge from [place]!!! Bathing/ Shower/ Hygiene Other Bathing/Shower/Hygiene Orders/Instructions: - Soak leg and use a gentle scrub brush to remove the thickened skin Edema Control - Lymphedema / SCD / Other Bilateral Lower Extremities Elevate legs to the level of the heart or above for 30 minutes daily and/or when sitting, a frequency of: - 3-4 times a day Avoid standing for long periods of time. Electronic Signature(s) Signed: 05/29/2022 8:44:10 AM By: Fredirick Maudlin MD FACS Entered By: Fredirick Maudlin on 05/29/2022 08:44:10 -------------------------------------------------------------------------------- Problem List Details Patient Name: Date of Service: Theodore Arellano. 05/29/2022 8:30 A M Medical Record Number: [id number] Patient Account Number: [id number] Date of Birth/Sex: Treating RN: 06/28/1961 (61 y.o. Theodore Arellano Primary Care Provider: [name] Other Clinician: Referring Provider: Treating Provider/Extender: Ulice Brilliant Weeks in Treatment: 9 Active Problems ICD-10 Encounter Code Description Active Date MDM Diagnosis L97.212 Non-pressure chronic ulcer of right calf with fat layer exposed 03/21/2022 No Yes I87.311 Chronic venous hypertension (idiopathic) with ulcer of right lower extremity 03/21/2022 No Yes N18.6 End stage renal disease 03/21/2022 No Yes E11.59 Type 2 diabetes mellitus with other circulatory complications 9/70/2637 No Yes I73.9 Peripheral vascular disease, unspecified 03/21/2022 No Yes I25.10 Atherosclerotic heart disease of native coronary artery without angina pectoris 03/21/2022 No Yes Inactive Problems Resolved  Problems Electronic Signature(s) Signed: 05/29/2022 8:42:23 AM By: Fredirick Maudlin MD FACS Entered By: Fredirick Maudlin on 05/29/2022 08:42:23 -------------------------------------------------------------------------------- Progress Note Details Patient Name: Date of Service: Theodore Arellano. 05/29/2022 8:30 A M Medical Record Number: [id number] Patient Account Number: [id number] Date of Birth/Sex: Treating RN: 06/13/1961 (61 y.o. Theodore Arellano Primary Care Provider: [name] Other Clinician: Referring Provider: Treating Provider/Extender: Alda Lea  in Treatment: 9 Subjective Chief Complaint Information obtained from Patient Patient presents for treatment of an open ulcer due to venous insufficiency History of Present Illness (HPI) ADMISSION 03/21/2022 This is a 61 year old type II diabetic with the most recent hemoglobin A1c available to me 6.4 but this is over a-year-old. He has a history of coronary artery disease, end-stage renal disease, peripheral artery disease (status post axillofemoral and Fem-PT bypasses), and venous insufficiency. His most recent vascular studies demonstrated abnormal TBI on the left but with normal ABIs bilaterally and normal TBI on the right. He has been followed at the [facility] wound care center for venous stasis ulcers. They have been using silver alginate and Tubigrip, according to the most recent notes available in the electronic medical record. I am not entirely sure why he decided to change centers and he even says that he has an appointment scheduled there tomorrow but he does not intend to keep it. Nonetheless, the ulcers on his right leg are healed. On the left, however, he has nearly circumferential exposure of the fat layer just above the ankle. He has woody, tree bark-like skin and it appears that moisture has gotten underneath and into the crevices, resulting in ulceration. There is  a strong odor coming from his wound. 03/28/2022: His wounds continue to have a strong fungus-like odor. He apparently did not pick up either the Bactrim or the fluconazole that was prescribed. [employer] was ordered but due to some issues locating a functioning telephone number, he has not received the product. The wounds have a thick layer of slough on them and the woody skin around them is mushy. 04/10/2022: The patient missed his visit last week and therefore his wrap stayed in place for 2 weeks. Upon removal, there was a thick layer of slime and a fairly overwhelming odor. He did get his [employer] and he has it with him. He grew out multiple species from his culture and therefore Augmentin was added to the Bactrim and fluconazole. He says that he picked up the antibiotics and started taking them yesterday. T oday, there is a heavy layer of malodorous slough on the entire wound surface. The wound has expanded, as well. 04/17/2022: Somewhat amazingly, the majority of his wound seems to have epithelialized over the last week. There is no odor, there is no slough. He has been taking his prescribed antibiotics and we applied his [employer] topical compounded antibiotic to the site last week. The majority of the previously open site is now covered with a layer of epithelium; I am not entirely clear whether or not this represents true healing or if the odd, woody skin that he has has just dried up in the areas that were considered part of his wound. 05/29/2022: The patient returns after 6-week absence. He has no open wounds at this time. He does have hyperkeratotic skin on his lower leg but no areas of moisture and there is no odor. Patient History Information obtained from Patient. Family History Diabetes - Mother, Heart Disease - Mother,Father, Hypertension - Mother,Father, Kidney Disease - Father, No family history of Cancer, Lung Disease, Seizures, Stroke, Thyroid Problems, Tuberculosis. Social  History Smoker, current status unknown - half pack day, Alcohol Use - Moderate, Drug Use - No History, Caffeine Use - Never. 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 No acute  distress.. Vitals Time Taken: 8:32 AM, Height: 67 in, Weight: 172 lbs, BMI: 26.9, Temperature: 98.1 F, Pulse: 68 bpm, Respiratory Rate: 18 breaths/min, Blood Pressure: 173/91 mmHg. Respiratory Normal work of breathing on room air.. General Notes: 05/29/2022: The patient has no open wounds at this time. He does have thick hyperkeratotic skin on his lower extremity, however. Integumentary (Hair, Skin) Wound #1 status is Healed - Epithelialized. Original cause of wound was Gradually Appeared. The date acquired was: 08/31/2021. The wound has been in treatment 9 weeks. The wound is located on the Right Lower Leg. The wound measures 0cm length x 0cm width x 0cm depth; 0cm^2 area and 0cm^3 volume. There is no tunneling or undermining noted. There is a none present amount of drainage noted. The wound margin is indistinct and nonvisible. There is no granulation within the wound bed. There is no necrotic tissue within the wound bed. Assessment Active Problems ICD-10 Non-pressure chronic ulcer of right calf with fat layer exposed Chronic venous hypertension (idiopathic) with ulcer of right lower extremity End stage renal disease Type 2 diabetes mellitus with other circulatory complications Peripheral vascular disease, unspecified Atherosclerotic heart disease of native coronary artery without angina pectoris Plan Discharge From [facility] Services: Discharge from [place]!!! Bathing/ Shower/ Hygiene: Other Bathing/Shower/Hygiene  Orders/Instructions: - Soak leg and use a gentle scrub brush to remove the thickened skin Edema Control - Lymphedema / SCD / Other: Elevate legs to the level of the heart or above for 30 minutes daily and/or when sitting, a frequency of: - 3-4 times a day Avoid standing for long periods of time. 05/29/2022: The patient has no open wounds at this time. He does have thick hyperkeratotic skin on his lower extremity, however. As he has no open wounds at this time, we will discharge him from the wound care center. I recommended that he try soaking his leg and gently scrubbing the skin to remove the thickened layers. He would also benefit from ammonium lactate lotion for the same purpose. He will follow-up on an as-needed basis. Electronic Signature(s) Signed: 05/29/2022 8:44:48 AM By: Fredirick Maudlin MD FACS Entered By: Fredirick Maudlin on 05/29/2022 08:44:48 -------------------------------------------------------------------------------- HxROS Details Patient Name: Date of Service: Theodore Arellano. 05/29/2022 8:30 A M Medical Record Number: [id number] Patient Account Number: [id number] Date of Birth/Sex: Treating RN: 08/23/1961 (61 y.o. Theodore Arellano Primary Care Provider: [name] Other Clinician: Referring Provider: Treating Provider/Extender: Alda Lea in Treatment: 9 Information Obtained From Patient Eyes Medical History: Past Medical History Notes: blind in right eye Cardiovascular Medical History: Positive for: Coronary Artery Disease; Hypertension Genitourinary Medical History: Past Medical History Notes: kidney transplant Musculoskeletal Medical History: Positive for: Gout; Osteoarthritis Immunizations Pneumococcal Vaccine: Received Pneumococcal Vaccination: Yes Received Pneumococcal Vaccination On or After 60th Birthday: Yes Implantable Devices None Family and Social History Cancer: No; Diabetes: Yes - Mother;  Heart Disease: Yes - Mother,Father; Hypertension: Yes - Mother,Father; Kidney Disease: Yes - Father; Lung Disease: No; Seizures: No; Stroke: No; Thyroid Problems: No; Tuberculosis: No; Smoker, current status unknown - half pack day; Alcohol Use: Moderate; Drug Use: No History; Caffeine Use: Never; Financial Concerns: No; Food, Clothing or Shelter Needs: No; Support System Lacking: No; Transportation Concerns: No Electronic Signature(s) Signed: 05/29/2022 8:54:38 AM By: Fredirick Maudlin MD FACS Signed: 05/29/2022 4:55:02 PM By: Adline Peals Entered By: Fredirick Maudlin on 05/29/2022 08:43:20 -------------------------------------------------------------------------------- SuperBill Details Patient Name: Date of Service: Theodore Arellano. 05/29/2022 Medical Record Number: [id number] Patient  Account Number: [id number] Date of Birth/Sex: Treating RN: 1961-04-06 (61 y.o. Theodore Arellano Primary Care Provider: [name] Other Clinician: Referring Provider: Treating Provider/Extender: Ulice Brilliant Weeks in Treatment: 9 Diagnosis Coding ICD-10 Codes Code Description [phone] Non-pressure chronic ulcer of right calf with fat layer exposed I87.311 Chronic venous hypertension (idiopathic) with ulcer of right lower extremity N18.6 End stage renal disease E11.59 Type 2 diabetes mellitus with other circulatory complications A55.2 Peripheral vascular disease, unspecified I25.10 Atherosclerotic heart disease of native coronary artery without angina pectoris Facility Procedures CPT4 Code: [id number] Description: 58307 - WOUND CARE VISIT-LEV 2 EST PT Modifier: Quantity: 1 Physician Procedures Electronic Signature(s) Signed: 05/29/2022 8:54:38 AM By: Fredirick Maudlin MD FACS Signed: 05/29/2022 4:55:02 PM By: Adline Peals Previous Signature: 05/29/2022 8:45:06 AM Version By: Fredirick Maudlin MD FACS Entered By: Adline Peals on 05/29/2022  08:47:33

## 2022-05-29 NOTE — Progress Notes (Signed)
Theodore Arellano, Theodore Arellano ([id number]) Visit Report for 05/29/2022 Arrival Information Details Patient Name: Date of Service: Theodore Arellano, Theodore Arellano. 05/29/2022 8:30 A M Medical Record Number: [id number] Patient Account Number: [id number] Date of Birth/Sex: Treating RN: [birth year] (61 y.o. Theodore Arellano Primary Care Levy Cedano: [name] Other Clinician: Referring [name]: Treating Taniela Feltus/Extender: Alda Lea in Treatment: 9 Visit Information History Since Last Visit Added or deleted any medications: No Patient Arrived: Ambulatory Any new allergies or adverse reactions: No Arrival Time: 08:28 Had a fall or experienced change in No Accompanied By: self activities of daily living that may affect Transfer Assistance: None risk of falls: Patient Identification Verified: Yes Signs or symptoms of abuse/neglect since last visito No Secondary Verification Process Completed: Yes Hospitalized since last visit: No Patient Has Alerts: Yes Implantable device outside of the clinic excluding No cellular tissue based products placed in the center since last visit: Has Dressing in Place as Prescribed: No Has Compression in Place as Prescribed: No Pain Present Now: No Electronic Signature(s) Signed: 05/29/2022 4:55:02 PM By: Adline Peals Entered By: Adline Peals on 05/29/2022 08:32:14 -------------------------------------------------------------------------------- Clinic Level of Care Assessment Details Patient Name: Date of Service: Theodore Arellano 05/29/2022 8:30 A M Medical Record Number: [id number] Patient Account Number: [id number] Date of Birth/Sex: Treating RN: 1961-05-03 (61 y.o. Theodore Arellano Primary Care Niurka Benecke: [name] Other Clinician: Referring Sherman Lipuma: Treating Daquawn Seelman/Extender: Alda Lea in Treatment: 9 Clinic Level of Care Assessment Items TOOL 4 Quantity Score X- 1  0 Use when only an EandM is performed on FOLLOW-UP visit ASSESSMENTS - Nursing Assessment / Reassessment X- 1 10 Reassessment of Co-morbidities (includes updates in patient status) X- 1 5 Reassessment of Adherence to Treatment Plan ASSESSMENTS - Wound and Skin A ssessment / Reassessment X - Simple Wound Assessment / Reassessment - one wound 1 5 '[]'$  - 0 Complex Wound Assessment / Reassessment - multiple wounds '[]'$  - 0 Dermatologic / Skin Assessment (not related to wound area) ASSESSMENTS - Focused Assessment X- 1 5 Circumferential Edema Measurements - multi extremities '[]'$  - 0 Nutritional Assessment / Counseling / Intervention X- 1 5 Lower Extremity Assessment (monofilament, tuning fork, pulses) '[]'$  - 0 Peripheral Arterial Disease Assessment (using hand held doppler) ASSESSMENTS - Ostomy and/or Continence Assessment and Care '[]'$  - 0 Incontinence Assessment and Management '[]'$  - 0 Ostomy Care Assessment and Management (repouching, etc.) PROCESS - Coordination of Care X - Simple Patient / Family Education for ongoing care 1 15 '[]'$  - 0 Complex (extensive) Patient / Family Education for ongoing care X- 1 10 Staff obtains Programmer, systems, Records, T Results / Process Orders est '[]'$  - 0 Staff telephones HHA, Nursing Homes / Clarify orders / etc '[]'$  - 0 Routine Transfer to another Facility (non-emergent condition) '[]'$  - 0 Routine Hospital Admission (non-emergent condition) '[]'$  - 0 New Admissions / Biomedical engineer / Ordering NPWT Apligraf, etc. , '[]'$  - 0 Emergency Hospital Admission (emergent condition) '[]'$  - 0 Simple Discharge Coordination '[]'$  - 0 Complex (extensive) Discharge Coordination PROCESS - Special Needs '[]'$  - 0 Pediatric / Minor Patient Management '[]'$  - 0 Isolation Patient Management '[]'$  - 0 Hearing / Language / Visual special needs '[]'$  - 0 Assessment of Community assistance (transportation, D/C planning, etc.) '[]'$  - 0 Additional assistance / Altered mentation '[]'$  -  0 Support Surface(s) Assessment (bed, cushion, seat, etc.) INTERVENTIONS - Wound Cleansing / Measurement '[]'$  - 0 Simple Wound Cleansing - one wound '[]'$  - 0 Complex  Wound Cleansing - multiple wounds X- 1 5 Wound Imaging (photographs - any number of wounds) '[]'$  - 0 Wound Tracing (instead of photographs) X- 1 5 Simple Wound Measurement - one wound '[]'$  - 0 Complex Wound Measurement - multiple wounds INTERVENTIONS - Wound Dressings '[]'$  - 0 Small Wound Dressing one or multiple wounds '[]'$  - 0 Medium Wound Dressing one or multiple wounds '[]'$  - 0 Large Wound Dressing one or multiple wounds '[]'$  - 0 Application of Medications - topical '[]'$  - 0 Application of Medications - injection INTERVENTIONS - Miscellaneous '[]'$  - 0 External ear exam '[]'$  - 0 Specimen Collection (cultures, biopsies, blood, body fluids, etc.) '[]'$  - 0 Specimen(s) / Culture(s) sent or taken to Lab for analysis '[]'$  - 0 Patient Transfer (multiple staff / Civil Service fast streamer / Similar devices) '[]'$  - 0 Simple Staple / Suture removal (25 or less) '[]'$  - 0 Complex Staple / Suture removal (26 or more) '[]'$  - 0 Hypo / Hyperglycemic Management (close monitor of Blood Glucose) '[]'$  - 0 Ankle / Brachial Index (ABI) - do not check if billed separately X- 1 5 Vital Signs Has the patient been seen at the hospital within the last three years: Yes Total Score: 70 Level Of Care: New/Established - Level 2 Electronic Signature(s) Signed: 05/29/2022 4:55:02 PM By: Adline Peals Entered By: Adline Peals on 05/29/2022 08:47:27 -------------------------------------------------------------------------------- Encounter Discharge Information Details Patient Name: Date of Service: Theodore Arellano. 05/29/2022 8:30 A M Medical Record Number: [id number] Patient Account Number: [id number] Date of Birth/Sex: Treating RN: 07-10-1961 (61 y.o. Theodore Arellano Primary Care Renita Brocks: [name] Other Clinician: Referring Kailer Heindel: Treating  Alfonso Carden/Extender: Alda Lea in Treatment: 9 Encounter Discharge Information Items Discharge Condition: Stable Ambulatory Status: Ambulatory Discharge Destination: Home Transportation: Private Auto Accompanied By: self Schedule Follow-up Appointment: Yes Clinical Summary of Care: Patient Declined Electronic Signature(s) Signed: 05/29/2022 4:55:02 PM By: Sabas Sous By: Adline Peals on 05/29/2022 08:47:51 -------------------------------------------------------------------------------- Lower Extremity Assessment Details Patient Name: Date of Service: Theodore Arellano 05/29/2022 8:30 A M Medical Record Number: [id number] Patient Account Number: [id number] Date of Birth/Sex: Treating RN: [birth year] (61 y.o. Theodore Arellano Primary Care Ayaz Sondgeroth: [name] Other Clinician: Referring Posie Lillibridge: Treating Shenoa Hattabaugh/Extender: Ulice Brilliant Weeks in Treatment: 9 Edema Assessment Assessed: Shirlyn Goltz: No] Patrice Paradise: No] [Left: Edema] [Right: :] Calf Left: Right: Point of Measurement: From Medial Instep 32.5 cm Ankle Left: Right: Point of Measurement: From Medial Instep 24 cm Vascular Assessment Pulses: Dorsalis Pedis Palpable: [Right:Yes] Electronic Signature(s) Signed: 05/29/2022 4:55:02 PM By: Adline Peals Entered By: Adline Peals on 05/29/2022 08:34:00 -------------------------------------------------------------------------------- Multi Wound Chart Details Patient Name: Date of Service: Theodore Arellano. 05/29/2022 8:30 A M Medical Record Number: [id number] Patient Account Number: [id number] Date of Birth/Sex: Treating RN: [birth year] (61 y.o. Theodore Arellano Primary Care Kinzee Happel: [name] Other Clinician: Referring Caterra Ostroff: Treating Murrel Freet/Extender: Ulice Brilliant Weeks in Treatment: 9 Vital Signs Height(in): 33 Pulse(bpm):  44 Weight(lbs): 172 Blood Pressure(mmHg): 173/91 Body Mass Index(BMI): 26.9 Temperature(F): 98.1 Respiratory Rate(breaths/min): 18 Photos: [N/A:N/A] Right Lower Leg N/A N/A Wound Location: Gradually Appeared N/A N/A Wounding Event: Venous Leg Ulcer N/A N/A Primary Etiology: Coronary Artery Disease, N/A N/A Comorbid History: Hypertension, Gout, Osteoarthritis 08/31/2021 N/A N/A Date Acquired: 9 N/A N/A Weeks of Treatment: Healed - Epithelialized N/A N/A Wound Status: No N/A N/A Wound Recurrence: 0x0x0 N/A N/A Measurements L x W x D (cm) 0 N/A N/A A (cm) :  rea 0 N/A N/A Volume (cm) : 100.00% N/A N/A % Reduction in Area: 100.00% N/A N/A % Reduction in Volume: Full Thickness Without Exposed N/A N/A Classification: Support Structures None Present N/A N/A Exudate Amount: Indistinct, nonvisible N/A N/A Wound Margin: None Present (0%) N/A N/A Granulation Amount: None Present (0%) N/A N/A Necrotic Amount: Fascia: No N/A N/A Exposed Structures: Fat Layer (Subcutaneous Tissue): No Tendon: No Muscle: No Joint: No Bone: No Large (67-100%) N/A N/A Epithelialization: Treatment Notes Electronic Signature(s) Signed: 05/29/2022 8:42:28 AM By: Fredirick Maudlin MD FACS Signed: 05/29/2022 4:55:02 PM By: Adline Peals Entered By: Fredirick Maudlin on 05/29/2022 08:42:28 -------------------------------------------------------------------------------- Multi-Disciplinary Care Plan Details Patient Name: Date of Service: Theodore Arellano. 05/29/2022 8:30 A M Medical Record Number: [id number] Patient Account Number: [id number] Date of Birth/Sex: Treating RN: 1961/05/06 (61 y.o. Theodore Arellano Primary Care Myda Detwiler: [name] Other Clinician: Referring Mackenzy Eisenberg: Treating Ceria Suminski/Extender: Ulice Brilliant Weeks in Treatment: 9 Active Inactive Electronic Signature(s) Signed: 05/29/2022 4:55:02 PM By: Sabas Sous  By: Adline Peals on 05/29/2022 08:47:01 -------------------------------------------------------------------------------- Pain Assessment Details Patient Name: Date of Service: Theodore Arellano 05/29/2022 8:30 A M Medical Record Number: [id number] Patient Account Number: [id number] Date of Birth/Sex: Treating RN: [birth year] (61 y.o. Theodore Arellano Primary Care Orli Degrave: [name] Other Clinician: Referring Marciano Mundt: Treating Masami Plata/Extender: Ulice Brilliant Weeks in Treatment: 9 Active Problems Location of Pain Severity and Description of Pain Patient Has Paino No Site Locations Rate the pain. Current Pain Level: 0 Pain Management and Medication Current Pain Management: Electronic Signature(s) Signed: 05/29/2022 4:55:02 PM By: Adline Peals Entered By: Adline Peals on 05/29/2022 08:32:33 -------------------------------------------------------------------------------- Patient/Caregiver Education Details Patient Name: Date of Service: Theodore Arellano 8/29/2023andnbsp8:30 A M Medical Record Number: [id number] Patient Account Number: [id number] Date of Birth/Gender: Treating RN: 08-30-1961 (61 y.o. Theodore Arellano Primary Care Physician: [name] Other Clinician: Referring Physician: Treating Physician/Extender: Alda Lea in Treatment: 9 Education Assessment Education Provided To: Patient Education Topics Provided Wound/Skin Impairment: Methods: Explain/Verbal Responses: Reinforcements needed, State content correctly Electronic Signature(s) Signed: 05/29/2022 4:55:02 PM By: Adline Peals Entered By: Adline Peals on 05/29/2022 08:38:01 -------------------------------------------------------------------------------- Wound Assessment Details Patient Name: Date of Service: Theodore Arellano. 05/29/2022 8:30 A M Medical Record Number: [id number] Patient  Account Number: [id number] Date of Birth/Sex: Treating RN: Feb 06, 1961 (61 y.o. Theodore Arellano Primary Care Gaye Scorza: [name] Other Clinician: Referring Caliope Ruppert: Treating Cleo Santucci/Extender: Ulice Brilliant Weeks in Treatment: 9 Wound Status Wound Number: 1 Primary Etiology: Venous Leg Ulcer Wound Location: Right Lower Leg Wound Status: Healed - Epithelialized Wounding Event: Gradually Appeared Comorbid Coronary Artery Disease, Hypertension, Gout, History: Osteoarthritis Date Acquired: 08/31/2021 Weeks Of Treatment: 9 Clustered Wound: No Photos Wound Measurements Length: (cm) Width: (cm) Depth: (cm) Area: (cm) Volume: (cm) 0 % Reduction in Area: 100% 0 % Reduction in Volume: 100% 0 Epithelialization: Large (67-100%) 0 Tunneling: No 0 Undermining: No Wound Description Classification: Full Thickness Without Exposed Support Structures Wound Margin: Indistinct, nonvisible Exudate Amount: None Present Foul Odor After Cleansing: No Slough/Fibrino No Wound Bed Granulation Amount: None Present (0%) Exposed Structure Necrotic Amount: None Present (0%) Fascia Exposed: No Fat Layer (Subcutaneous Tissue) Exposed: No Tendon Exposed: No Muscle Exposed: No Joint Exposed: No Bone Exposed: No Electronic Signature(s) Signed: 05/29/2022 4:55:02 PM By: Adline Peals Entered By: Adline Peals on 05/29/2022 08:38:54 -------------------------------------------------------------------------------- Vitals Details Patient Name: Date of Service: Theodore Arellano, Theodore Arellano.  05/29/2022 8:30 A M Medical Record Number: [id number] Patient Account Number: [id number] Date of Birth/Sex: Treating RN: [birth year] (61 y.o. Theodore Arellano Primary Care Carnella Fryman: [name] Other Clinician: Referring Remy Voiles: Treating Marjorie Lussier/Extender: Ulice Brilliant Weeks in Treatment: 9 Vital Signs Time Taken:  08:32 Temperature (F): 98.1 Height (in): 67 Pulse (bpm): 68 Weight (lbs): 172 Respiratory Rate (breaths/min): 18 Body Mass Index (BMI): 26.9 Blood Pressure (mmHg): 173/91 Reference Range: 80 - 120 mg / dl Electronic Signature(s) Signed: 05/29/2022 4:55:02 PM By: Adline Peals Entered By: Adline Peals on 05/29/2022 86:82:57

## 2022-11-07 ENCOUNTER — Observation Stay (HOSPITAL_COMMUNITY)
Admission: EM | Admit: 2022-11-07 | Discharge: 2022-11-09 | Disposition: A | Discharge status: Home / Self Care | Payer: 59 | Attending: Family Medicine | Admitting: Family Medicine

## 2022-11-07 ENCOUNTER — Emergency Department (HOSPITAL_COMMUNITY): Payer: 59

## 2022-11-07 ENCOUNTER — Other Ambulatory Visit: Payer: Self-pay

## 2022-11-07 DIAGNOSIS — Z86718 Personal history of other venous thrombosis and embolism: Secondary | ICD-10-CM | POA: Insufficient documentation

## 2022-11-07 DIAGNOSIS — I10 Essential (primary) hypertension: Secondary | ICD-10-CM

## 2022-11-07 DIAGNOSIS — I441 Atrioventricular block, second degree: Secondary | ICD-10-CM

## 2022-11-07 DIAGNOSIS — I110 Hypertensive heart disease with heart failure: Secondary | ICD-10-CM | POA: Insufficient documentation

## 2022-11-07 DIAGNOSIS — R001 Bradycardia, unspecified: Principal | ICD-10-CM | POA: Diagnosis present

## 2022-11-07 DIAGNOSIS — E119 Type 2 diabetes mellitus without complications: Secondary | ICD-10-CM | POA: Diagnosis not present

## 2022-11-07 DIAGNOSIS — I2510 Atherosclerotic heart disease of native coronary artery without angina pectoris: Secondary | ICD-10-CM | POA: Diagnosis not present

## 2022-11-07 DIAGNOSIS — R7989 Other specified abnormal findings of blood chemistry: Secondary | ICD-10-CM | POA: Diagnosis not present

## 2022-11-07 DIAGNOSIS — Z79899 Other long term (current) drug therapy: Secondary | ICD-10-CM | POA: Diagnosis not present

## 2022-11-07 DIAGNOSIS — Z7901 Long term (current) use of anticoagulants: Secondary | ICD-10-CM | POA: Insufficient documentation

## 2022-11-07 DIAGNOSIS — Z7982 Long term (current) use of aspirin: Secondary | ICD-10-CM | POA: Diagnosis not present

## 2022-11-07 DIAGNOSIS — I5022 Chronic systolic (congestive) heart failure: Secondary | ICD-10-CM | POA: Diagnosis present

## 2022-11-07 DIAGNOSIS — I5020 Unspecified systolic (congestive) heart failure: Secondary | ICD-10-CM | POA: Diagnosis not present

## 2022-11-07 LAB — CBC WITH DIFFERENTIAL/PLATELET
Abs Immature Granulocytes: 0.03 10*3/uL (ref 0.00–0.07)
Basophils Absolute: 0 10*3/uL (ref 0.0–0.1)
Basophils Relative: 0 %
Eosinophils Absolute: 0.3 10*3/uL (ref 0.0–0.5)
Eosinophils Relative: 4 %
HCT: 42.6 % (ref 39.0–52.0)
Hemoglobin: 13.6 g/dL (ref 13.0–17.0)
Immature Granulocytes: 0 %
Lymphocytes Relative: 12 %
Lymphs Abs: 0.9 10*3/uL (ref 0.7–4.0)
MCH: 28 pg (ref 26.0–34.0)
MCHC: 31.9 g/dL (ref 30.0–36.0)
MCV: 87.8 fL (ref 80.0–100.0)
Monocytes Absolute: 0.4 10*3/uL (ref 0.1–1.0)
Monocytes Relative: 6 %
Neutro Abs: 5.6 10*3/uL (ref 1.7–7.7)
Neutrophils Relative %: 78 %
Platelets: 285 10*3/uL (ref 150–400)
RBC: 4.85 MIL/uL (ref 4.22–5.81)
RDW: 15.3 % (ref 11.5–15.5)
WBC: 7.2 10*3/uL (ref 4.0–10.5)
nRBC: 0 % (ref 0.0–0.2)

## 2022-11-07 LAB — BASIC METABOLIC PANEL
Anion gap: 12 (ref 5–15)
BUN: 23 mg/dL (ref 8–23)
CO2: 24 mmol/L (ref 22–32)
Calcium: 9.3 mg/dL (ref 8.9–10.3)
Chloride: 103 mmol/L (ref 98–111)
Creatinine, Ser: 1.43 mg/dL — ABNORMAL HIGH (ref 0.61–1.24)
GFR, Estimated: 55 mL/min — ABNORMAL LOW (ref >60)
Glucose, Bld: 91 mg/dL (ref 70–99)
Potassium: 3.9 mmol/L (ref 3.5–5.1)
Sodium: 139 mmol/L (ref 135–145)

## 2022-11-07 LAB — MAGNESIUM: Magnesium: 1.6 mg/dL — ABNORMAL LOW (ref 1.7–2.4)

## 2022-11-07 LAB — BRAIN NATRIURETIC PEPTIDE: B Natriuretic Peptide: 737.6 pg/mL — ABNORMAL HIGH (ref 0.0–100.0)

## 2022-11-07 LAB — TROPONIN I (HIGH SENSITIVITY)
Troponin I (High Sensitivity): 41 ng/L — ABNORMAL HIGH (ref <18)
Troponin I (High Sensitivity): 42 ng/L — ABNORMAL HIGH (ref <18)

## 2022-11-07 MED ORDER — HYDRALAZINE HCL 20 MG/ML IJ SOLN
5.0000 mg | Freq: Once | INTRAMUSCULAR | Status: AC
  Administered 2022-11-07: 5 mg via INTRAVENOUS
  Filled 2022-11-07: qty 1

## 2022-11-07 NOTE — Consult Note (Signed)
Cardiology Consultation   Patient ID: Theodore Arellano MRN: [id number]; DOB: 05/24/1961  Admit date: 11/07/2022 Date of Consult: 11/07/2022  PCP:  [name], [place] [facility] Cardiologist:  Jenean Lindau, MD    Patient Profile:   Theodore Arellano is a 62 y.o. male with a hx of renal transplant, HTN, Mobitz type 1 HB, peripheral artery disease, history of DVT, CAD  who is being seen 11/07/2022 for the evaluation of bradycardia at the request of Dr. Billy Fischer.  History of Present Illness:   Theodore Arellano is a 62 year old male with above medical history who is followed by Dr. Geraldo Pitter. Per chart review, patient had a L/R heart catheterization in 2013 for evaluation of progressive dyspnea and was found to have severe calcific 3 vessel CAD. Dr. Haroldine Laws felt like CABG would not be worth the risk, and the two agreed to pursue medical management. Patient later presented to the hospital in 11/2017 for treatment of intermittent chest and back pain. He had an echocardiogram on 11/28/17 that showed EF 35-40% with wall motion abnormalities. Left heart catheterization on 11/29/17 showed severe multivessel CAD with extensive calcification and dramatic progression of disease from last cath. Patient was felt not to be a candidate for PCI due to the extent of calcified disease and history of renal transplant. He was seen by CT surgery and was not a candidate for surgery. He was managed medically.   He was again admitted in 05/2019 as a STEMI activation in the setting of chest pain. LHC 06/01/2019 showed severe Tripple CAD, with chronic occlusion of the LAD and RCA. Noted to have severe diffuse disease in the OM branch and distal circumflex that was not amenable to PCI. Patient remained on medical therapy. Echocardiogram 05/31/19 showed EF 35-40%, impaired relaxation, severe akinesis of the LV anterior wall, anteroseptal wall, and apical segment (LAD territory wall motion abnormality)   More  recently, it appears patient has followed with [facility]. He was seen in 01/2022 for evaluation of irregular heart beat, and was instructed to wear a cardiac monitor for evaluation. However, he refused to wear the monitor. EKG at [facility] in 06/2022 showed sinus bradycardia with Mobitz Type I AV block. EKG in 10/2022 also showed mobitz type I AV block.   Patient presented to the ED on 2/7. Patient had been seen by his PCP earlier that day and was found to be bradycardic. In triage, patient was pale and diaphoretic. Initial EKG showed 2:1 AV block with a ventricular rate of 34 BPM. Of note, patient was on carvedilol at home. In the ED, BNP was elevated to 737. hsTn 41. Patient was treated with IV hydralazine.   The patient denies presyncope, syncope, palpitations, paroxysmal nocturnal dyspnea, orthopnea, or chest pain.  His biggest complaint today is a minor headache that started a few minutes prior to our interview.  This is behind his right eye.  Currently his heart rate is in the 70s.    Past Medical History:  Diagnosis Date   Anxiety    Arthritis    "lower back" (11/28/2017)   CAD (coronary artery disease)    Chronic lower back pain    H/O immunosuppressive therapy    chronic/notes 11/28/2017   History of gout    "before kidney transport" (11/28/2017)   Hyperlipidemia    Hypertension    Kidney disease    s/p kidney transplant 2011; "not on dialysis now" (11/28/2017)   Tobacco  use     Past Surgical History:  Procedure Laterality Date   ABDOMINAL AORTOGRAM W/LOWER EXTREMITY Bilateral 12/29/2020   Procedure: ABDOMINAL AORTOGRAM W/LOWER EXTREMITY;  Surgeon: Marty Heck, MD;  Location: [place] [facility];  Service: Cardiovascular;  Laterality: Bilateral;   ARTERIOVENOUS GRAFT PLACEMENT Right 04/2007   /notes 02/01/2011   AV FISTULA PLACEMENT Left 12/20/2005; 12/2006   Archie Endo 02/13/2011; Archie Endo 02/13/2011   AXILLARY-FEMORAL BYPASS GRAFT Right 01/03/2021   Procedure: RIGHT  AXILLARY TO PROFUNDA FEMORAL BYPASS;  Surgeon: Rosetta Posner, MD;  Location: [facility];  Service: Vascular;  Laterality: Right;   CARDIAC CATHETERIZATION     FEMORAL-POPLITEAL BYPASS GRAFT Right 01/03/2021   Procedure: RIGHT FEMORAL TO BELOW KNEE POPLITEAL ARTERY BYPASS;  Surgeon: Rosetta Posner, MD;  Location: [place];  Service: Vascular;  Laterality: Right;   HD access procedures     INGUINAL HERNIA REPAIR Left    KIDNEY TRANSPLANT  2011   LEFT HEART CATH AND CORONARY ANGIOGRAPHY N/A 11/29/2017   Procedure: LEFT HEART CATH AND CORONARY ANGIOGRAPHY;  Surgeon: Leonie Man, MD;  Location: [place] [facility];  Service: Cardiovascular;  Laterality: N/A;   LEFT HEART CATH AND CORONARY ANGIOGRAPHY N/A 06/01/2019   Procedure: LEFT HEART CATH AND CORONARY ANGIOGRAPHY;  Surgeon: Burnell Blanks, MD;  Location: [place] [facility];  Service: Cardiovascular;  Laterality: N/A;   LEFT HEART CATHETERIZATION WITH CORONARY ANGIOGRAM  03/11/2012   Procedure: LEFT HEART CATHETERIZATION WITH CORONARY ANGIOGRAM;  Surgeon: Jolaine Artist, MD;  Location: [facility] LAB;  Service: Cardiovascular;;   RIGHT HEART CATHETERIZATION N/A 03/11/2012   Procedure: RIGHT HEART CATH;  Surgeon: Jolaine Artist, MD;  Location: [facility] LAB;  Service: Cardiovascular;  Laterality: N/A;   THROMBECTOMY Right 12/2007   Archie Endo 02/01/2011   THROMBECTOMY / ARTERIOVENOUS GRAFT REVISION Left 12/2006   Archie Endo 02/13/2011   THROMBECTOMY / ARTERIOVENOUS GRAFT REVISION Right 07/2007; 10/2007;01/2008;   Archie Endo 01/31/2011; Archie Endo 02/01/2011; Archie Endo 01/31/2011   THROMBECTOMY AND REVISION OF ARTERIOVENTOUS (AV) GORETEX  GRAFT  03/2007 X 2   Archie Endo 02/01/2011     Home Medications:  Prior to Admission medications   Medication Sig Start Date End Date Taking? Authorizing Provider  acetaminophen (TYLENOL) 500 MG tablet Take 500 mg by mouth every 6 (six) hours as needed for moderate pain or headache.    [provider]  amLODipine (NORVASC) 10 MG  tablet Take 1 tablet (10 mg total) by mouth daily. 01/07/21   Martyn Malay, MD  aspirin EC 81 MG tablet Take 81 mg by mouth daily.    [provider]  blood glucose meter kit and supplies KIT Dispense based on patient and insurance preference. Use up to four times daily as directed. (FOR ICD-9 250.00, 250.01). 11/11/19   Nita Sells, MD  brimonidine (ALPHAGAN) 0.2 % ophthalmic solution Place 1 drop into the right eye 3 (three) times daily.    [provider]  carvedilol (COREG) 3.125 MG tablet Take 1 tablet (3.125 mg total) by mouth 2 (two) times daily with a meal. 01/06/21   Martyn Malay, MD  colchicine 0.6 MG tablet Take 1 tablet (0.6 mg total) by mouth daily as needed. 01/06/21   Martyn Malay, MD  dorzolamide-timolol (COSOPT) 22.3-6.8 MG/ML ophthalmic solution Place 1 drop into the right eye 3 (three) times daily.    [provider]  gabapentin (NEURONTIN) 100 MG capsule Take 2 capsules (200 mg total) by mouth 3 (three) times daily.  01/06/21   Martyn Malay, MD  hydrOXYzine (VISTARIL) 25 MG capsule Take 25 mg by mouth 3 (three) times daily as needed for anxiety. 11/15/20   [provider]  mycophenolate (MYFORTIC) 360 MG TBEC EC tablet Take 720 mg by mouth 2 (two) times daily.    [provider]  NARCAN 4 MG/0.1ML LIQD nasal spray kit Place 1 spray into the nose as needed (overdose). 12/02/19   [provider]  pantoprazole (PROTONIX) 40 MG tablet Take 40 mg by mouth daily. 11/02/19   [provider]  predniSONE (DELTASONE) 5 MG tablet Take 5 mg by mouth daily. 12/07/19   [provider]  rivaroxaban (XARELTO) 2.5 MG TABS tablet Take 1 tablet by mouth 2 (two) times daily. 06/10/20   [provider]  rosuvastatin (CRESTOR) 20 MG tablet Take 1 tablet (20 mg total) by mouth daily at 6 PM. 01/06/21   Martyn Malay, MD  sodium bicarbonate 650 MG tablet Take 1,300 mg by mouth 2 (two) times daily.    [provider]  tacrolimus (PROGRAF) 1 MG capsule Take 2-3 mg by mouth See admin instructions. Take '3mg'$  in the morning and '2mg'$  at night, 12 hours apart.    [provider]  tamsulosin (FLOMAX) 0.4 MG CAPS capsule Take 0.4 mg by mouth at bedtime. 08/28/19   [provider]    Inpatient Medications: Scheduled Meds:  hydrALAZINE  5 mg Intravenous Once   Continuous Infusions:  PRN Meds:   Allergies:    Allergies  Allergen Reactions   Percocet [Oxycodone-Acetaminophen] Hives and Itching   Vicodin [Hydrocodone-Acetaminophen] Hives and Itching   Shellfish Allergy Hives   Chocolate Flavor Hives   Chocolate Hives   Tomato Hives    Social History:   Social History   Socioeconomic History   Marital status: Single    Spouse name: Not on file   Number of children: Not on file   Years of education: Not on file   Highest education level: Not on file  Occupational History   Occupation: Unemployed truck driver  Tobacco Use   Smoking status: Every Day    Packs/day: 0.50    Years: 40.00    Total pack years: 20.00    Types: Cigarettes   Smokeless tobacco: Never  Vaping Use   Vaping Use: Never used  Substance and Sexual Activity   Alcohol use: Yes    Alcohol/week: 12.0 standard drinks of alcohol    Types: 12 Cans of beer per week   Drug use: No   Sexual activity: Never    Birth control/protection: None  Other Topics Concern   Not on file  Social History Narrative   Not on file   Social Determinants of Health   Financial Resource Strain: Not on file  Food Insecurity: Not on file  Transportation Needs: Not on file  Physical Activity: Not on file  Stress: Not on file  Social Connections: Not on file  Intimate Partner Violence: Not on file    Family History:    Family History  Problem Relation Age of Onset   Hypertension Father    Diabetes Mother      ROS:  Please see the history of present illness.   All other ROS reviewed and negative.     Physical  Exam/Data:   Vitals:   11/07/22 1446 11/07/22 1447 11/07/22 1448 11/07/22 1450  BP:    (!) 209/81  Pulse:  (!) 40 78   Resp:  16  20 17  Temp: 98.1 F (36.7 C)     TempSrc: Temporal     SpO2:  99% 97%   Weight: 81.6 kg     Height: '5\' 7"'$  (1.702 m)      No intake or output data in the 24 hours ending 11/07/22 1726    11/07/2022    2:46 PM 02/12/2022   12:24 PM 01/03/2021    3:05 AM  Last 3 Weights  Weight (lbs) 180 lb 170 lb 158 lb 8.2 oz  Weight (kg) 81.647 kg 77.111 kg 71.9 kg     Body mass index is 28.19 kg/m.  General:  Well nourished, well developed, in no acute distress HEENT: Right eye cataract Neck: no JVD Vascular: No carotid bruits; Distal pulses 2+ bilaterally Cardiac:  normal S1, S2; RRR; no murmur  Lungs:  clear to auscultation bilaterally, no wheezing, rhonchi or rales  Abd: soft, nontender, no hepatomegaly  Ext: no edema Musculoskeletal:  No deformities, BUE and BLE strength normal and equal Skin: warm and dry  Neuro:  CNs 2-12 intact, no focal abnormalities noted Psych:  Normal affect   EKG:  The EKG was personally reviewed and demonstrates: 2-1 AV block Telemetry:  Telemetry was personally reviewed and demonstrates: Normal sinus rhythm  Relevant CV Studies: Coronary angiography 2020: 1. Severe triple vessel CAD 2. The heavily calcified LAD is occluded in the mid vessel. The distal LAD fills from left to left bridging collaterals.  3. The heavily calcified Circumflex has severe moderate mid and severe distal vessel stenosis. The large obtuse marginal branch has severe diffuse disease.  4. The RCA is occluded proximally. The distal vessel branches fill from left to right collaterals.  5. Mild elevation LV filling pressures  TTE 2020: 1. The left ventricle has moderately reduced systolic function, with an  ejection fraction of 35-40%. The cavity size was mildly dilated. There is  mildly increased left ventricular wall thickness. Left ventricular  diastolic  Doppler parameters are  consistent with impaired relaxation. Indeterminate filling pressures The  E/e' is 8-15.   2. Severe akinesis of the left ventricular, entire anterior wall,  anteroseptal wall and apical segment.   3. The mitral valve is abnormal. Mild thickening of the mitral valve  leaflet.   4. The tricuspid valve is grossly normal.   5. The aortic valve is tricuspid. Mild sclerosis of the aortic valve.  Aortic valve regurgitation is mild by color flow Doppler.   6. The aorta is normal unless otherwise noted.   Laboratory Data:  High Sensitivity Troponin:   Recent Labs  Lab 11/07/22 1528  TROPONINIHS 41*     Chemistry Recent Labs  Lab 11/07/22 1528  NA 139  K 3.9  CL 103  CO2 24  GLUCOSE 91  BUN 23  CREATININE 1.43*  CALCIUM 9.3  MG 1.6*  GFRNONAA 55*  ANIONGAP 12    No results for input(s): "PROT", "ALBUMIN", "AST", "ALT", "ALKPHOS", "BILITOT" in the last 168 hours. Lipids No results for input(s): "CHOL", "TRIG", "HDL", "LABVLDL", "LDLCALC", "CHOLHDL" in the last 168 hours.  Hematology Recent Labs  Lab 11/07/22 1528  WBC 7.2  RBC 4.85  HGB 13.6  HCT 42.6  MCV 87.8  MCH 28.0  MCHC 31.9  RDW 15.3  PLT 285   Thyroid No results for input(s): "TSH", "FREET4" in the last 168 hours.  BNP Recent Labs  Lab 11/07/22 1528  BNP 737.6*    DDimer No results for input(s): "DDIMER" in the last 168  hours.   Radiology/Studies:  DG Chest 2 View  Result Date: 11/07/2022 CLINICAL DATA:  Shortness of breath EXAM: CHEST - 2 VIEW COMPARISON:  12/08/2020, 01/11/2020 FINDINGS: Post sternotomy changes and atrial appendage clip. Chronic curvilinear opacity at the right hilar area. Upper normal cardiac size. Mild central vascular congestion. No consolidation or effusion. Aortic atherosclerosis. IMPRESSION: Borderline cardiomegaly with slight central congestion. Electronically Signed   By: Donavan Foil M.D.   On: 11/07/2022 15:54     Assessment and Plan:    Asymptomatic bradycardia: The patient developed intermittent 2-1 AV block which is now resolved.  He has refused monitors in the past.  He does follow with cardiology as an outpatient.  At this point in time the patient has no signs or symptoms of heart failure and has had no high risk symptoms such as presyncope or syncope.  I think it would be reasonable to go ahead and hold the patient's carvedilol and have him follow-up as an outpatient with his primary cardiologist.  This was discussed with the ED staff who agree with this approach.   Risk Assessment/Risk Scores:            [facility] will sign off.   Medication Recommendations:  D/C carvedilol Other recommendations (labs, testing, etc):  None Follow up as an outpatient:  Follow up with Primary Cardiologist  For questions or updates, please contact [place] Please consult [email or web address] for contact info under    Signed, Margie Billet, PA-C  11/07/2022 5:26 PM

## 2022-11-07 NOTE — H&P (Shared)
Hospital Admission History and Physical Service Pager: [phone]  Patient name: Theodore Arellano Medical record number: [id number] Date of Birth: 10-06-1960 Age: 62 y.o. Gender: male  Primary Care Provider: [name], MD Consultants: Cardiology Code Status: Full Preferred Emergency Contact: Garald Balding (daughter) [phone]  Chief Complaint: bradycardia  Assessment and Plan: Theodore Arellano is a 62 y.o. male presenting with bradycardia and noted to be pale and diaphoretic in triage, developed systolics to 426S in ED with PMHx of renal transplant, HTN, Mobitz type 1 HB, peripheral artery disease, history of DVT, CAD, and HFrEF.  * Bradycardia Patient initially presented with bradycardia in the 40s, found to be in 2:1 2nd degree AV block. Resolved upon cardiology evaluation in the ED but then recurred with HRs into the 30s intermittently. - admit to FMTS telemetry attending Dr. Thompson Grayer - continuous cardiac monitoring - stopped home carvedilol 6.25 BID in ED - re-consult cardiology in AM  HTN (hypertension) Patient has chronic HTN. In ED patient developed systolic BPs in the 341D. At time of evaluation patient was asymptomatic. Patient received hydralazine in ED - f/u med rec by pharmacy to clarify outpatient medication regimen  HFrEF (heart failure with reduced ejection fraction) ([place]) Echo 05/31/2019 showed EF 35 - 40%. On admission, BNP 737.6 and CXR showed borderline cardiomegaly with slight central congestion. - give 20 mg IV furosemide once - strict I/Os - daily weights - echo ordered  Coronary artery disease L/R heart catheterization in 2013 showed severe calcific 3 vessel CAD which was managed medically. 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 Trop mildly elevated this admission 41>42 which appears chronic. - continue aspirin 81 mg daily - continue rivaroxaban  2.5 mg BID - outpatient cardiology follow-up  Hypomagnesemia Mg 1.6. Given '2mg'$  IV mag - f/u Mg in AM  Other chronic conditions: S/p renal transplant - Cr elevation appears chronic (baseline ~1.3). Continue tacrolimus, mycophenolate, prednisone HLD - continue rosuvastatin BPH - tamsulosin DM - f/u a1c  FEN/GI: heart healthy VTE Prophylaxis: rivaroxaban  Disposition: home pending clinical improvement  History of Present Illness:  Theodore Arellano is a 62 y.o. male presenting with bradycardia and noted to be pale and diaphoretic in triage, developed systolics to 622W in ED with PMHx of renal transplant, HTN, Mobitz type 1 HB, peripheral artery disease, history of DVT, and CAD.  Patient went to see his PCP today for a visit related to his diabetic shoes. They found his heart rate to be in the 40s and sent him to the ED.  Patient endorses occasional shortness of breath but otherwise denies symptoms. No chest pain, palpitations, dizziness, lightheadedness, or syncope.  In the ED, cardiology was consulted to evaluate patient's bradycardia and recommended holding patient's carvedilol and have him follow-up as an outpatient with his primary cardiologist. Patient subsequently found to have systolics in the 979G and admitted for stabilization - given hydralazine in ED.  Review Of Systems: Per HPI with the following additions: denies chest pain, SOB, or abdominal pain  Pertinent Past Medical History: T2DM - f/u A1c  Remainder reviewed in history tab.   Pertinent Past Surgical History: Renal transplant  Remainder reviewed in history tab.   Pertinent Social History: Tobacco use: Yes Alcohol use: Yes (fifth of vodka) Other Substance use: Denies Lives with friends  Pertinent Family History: Mom had diabetes Dad with HTN  Remainder reviewed in history  tab.   Important Outpatient Medications: Tacrolimus '3mg'$  qam and '2mg'$  qhs Mycophenolate '720mg'$  BID Prednisone '5mg'$  daily Carvedilol 6.'25mg'$   BID Losartan '25mg'$  daily Plavix '75mg'$  daily ASA '81mg'$  daily Xarelto 2.'5mg'$  daily Rosuvastatin '5mg'$  daily Flomax 0.'4mg'$  daily Sodium bicarb '1300mg'$  BID Protonix '40mg'$  daily Gabapentin '100mg'$  BID Colchicine 0.'6mg'$  prn for gout attacks Hydroxyzine '25mg'$ -'50mg'$  q6h prn for itching  Remainder reviewed in medication history.   Objective: BP (!) 169/80 (BP Location: Left Arm)   Pulse 60   Temp 98 F (36.7 C) (Oral)   Resp 17   Ht '5\' 7"'$  (1.702 m)   Wt 76.9 kg   SpO2 99%   BMI 26.56 kg/m  General: somnolent. in no acute distress HEENT: normocephalic and atraumatic Respiratory: CTAB anteriorly, normal respiratory effort, and on RA Extremities: moving all extremities spontaneously Gastrointestinal: non-tender and non-distended Cardiovascular: bradycardic Derm: dried, thickened scaling on RLE  Labs:  CBC BMET  Recent Labs  Lab 11/07/22 1528  WBC 7.2  HGB 13.6  HCT 42.6  PLT 285   Recent Labs  Lab 11/07/22 1528  NA 139  K 3.9  CL 103  CO2 24  BUN 23  CREATININE 1.43*  GLUCOSE 91  CALCIUM 9.3     Pertinent additional labs: Mg 1.6 BNP 737.6  Troponin 41 > 42  EKG: second degree heart block  Imaging Studies Performed:  CXR 2/7 IMPRESSION: Borderline cardiomegaly with slight central congestion.  CT angio chest aorta w w/o contrast pending  Camelia Phenes, MD 11/08/2022, 1:55 AM PGY-1, [place] Intern pager: [phone], text pages welcome Secure chat group [place] Upper-Level Resident Addendum   I have independently interviewed and examined the patient. I have discussed the above with Dr. Edd Arbour and agree with the documented plan. My edits for correction/addition/clarification are included above. Please see any attending notes.   Alcus Dad, MD PGY-3, [facility] 11/08/2022 2:18 AM  FPTS Service pager: [phone] (text pages welcome through [facility])

## 2022-11-07 NOTE — ED Notes (Signed)
MD notified about pt BP and needing access

## 2022-11-07 NOTE — ED Notes (Signed)
RN took EKG as soon as pt arrived, EKG did not cross over. Another EKG was taken

## 2022-11-07 NOTE — ED Triage Notes (Addendum)
Pt BIB EMS from PCP for bradycardia. Pt is in AV block. 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 Pt was pale, diaphoretic, and was found to be in heart block. Pt is axox4. Pt 015 systolic. Pt had kidney transplant 2021. Pt is on beta blocker

## 2022-11-07 NOTE — ED Provider Notes (Signed)
[facility] Provider Note   CSN: [id number] Arrival date & time: 11/07/22  1436     History  Chief Complaint  Patient presents with   Shortness of Breath   Bradycardia    Theodore Arellano is a 62 y.o. male.  Patient presents to the emergency department via EMS with chief complaints of bradycardia.  Patient was at his primary care provider office earlier this morning and was noted to have a heart rate in the 40s.  EMS was called for transport to the emergency department and noticed that the patient was in a second-degree heart block with rates in the 40s.  Patient was also hypertensive with a systolic measurement of 174.  Patient currently denies symptoms.  He denies chest pain, shortness of breath, weakness, nausea, vomiting.  He states when he first walked outside this morning and to be deep breath of cold air he felt a very momentary episode of chest pain that went away as soon as he got out of the cold air.  He denies any further episodes of chest pain.  This pains he thinks is just due to the cold air.  The patient has extensive past medical history including renal transplant, hypertension, type II DM with vascular disease, Mobitz type I heart block, peripheral artery disease, history of DVT, NSTEMI.  Patient does take Xarelto and also takes carvedilol as a beta-blocker  HPI     Home Medications Prior to Admission medications   Medication Sig Start Date End Date Taking? Authorizing Provider  acetaminophen (TYLENOL) 500 MG tablet Take 500 mg by mouth every 6 (six) hours as needed for moderate pain or headache.    [provider]  amLODipine (NORVASC) 10 MG tablet Take 1 tablet (10 mg total) by mouth daily. 01/07/21   Martyn Malay, MD  aspirin EC 81 MG tablet Take 81 mg by mouth daily.    [provider]  blood glucose meter kit and supplies KIT Dispense based on patient and insurance preference. Use up to four times daily as  directed. (FOR ICD-9 250.00, 250.01). 11/11/19   Nita Sells, MD  brimonidine (ALPHAGAN) 0.2 % ophthalmic solution Place 1 drop into the right eye 3 (three) times daily.    [provider]  carvedilol (COREG) 3.125 MG tablet Take 1 tablet (3.125 mg total) by mouth 2 (two) times daily with a meal. 01/06/21   Martyn Malay, MD  colchicine 0.6 MG tablet Take 1 tablet (0.6 mg total) by mouth daily as needed. 01/06/21   Martyn Malay, MD  dorzolamide-timolol (COSOPT) 22.3-6.8 MG/ML ophthalmic solution Place 1 drop into the right eye 3 (three) times daily.    [provider]  gabapentin (NEURONTIN) 100 MG capsule Take 2 capsules (200 mg total) by mouth 3 (three) times daily. 01/06/21   Martyn Malay, MD  hydrOXYzine (VISTARIL) 25 MG capsule Take 25 mg by mouth 3 (three) times daily as needed for anxiety. 11/15/20   [provider]  mycophenolate (MYFORTIC) 360 MG TBEC EC tablet Take 720 mg by mouth 2 (two) times daily.    [provider]  NARCAN 4 MG/0.1ML LIQD nasal spray kit Place 1 spray into the nose as needed (overdose). 12/02/19   [provider]  pantoprazole (PROTONIX) 40 MG tablet Take 40 mg by mouth daily. 11/02/19   [provider]  predniSONE (DELTASONE) 5 MG tablet Take 5 mg by mouth daily. 12/07/19   [provider]  rivaroxaban (XARELTO) 2.5 MG TABS tablet Take 1 tablet by mouth 2 (two) times daily. 06/10/20   [provider]  rosuvastatin (CRESTOR) 20 MG tablet Take 1 tablet (20 mg total) by mouth daily at 6 PM. 01/06/21   Martyn Malay, MD  sodium bicarbonate 650 MG tablet Take 1,300 mg by mouth 2 (two) times daily.    [provider]  tacrolimus (PROGRAF) 1 MG capsule Take 2-3 mg by mouth See admin instructions. Take '3mg'$  in the morning and '2mg'$  at night, 12 hours apart.    [provider]  tamsulosin (FLOMAX) 0.4 MG CAPS capsule Take 0.4 mg by mouth at bedtime. 08/28/19   [provider]       Allergies    Percocet [oxycodone-acetaminophen], Vicodin [hydrocodone-acetaminophen], Shellfish allergy, Chocolate flavor, Chocolate, and Tomato    Review of Systems   Review of Systems  Constitutional:  Negative for fever.  Respiratory:  Negative for cough and shortness of breath.   Cardiovascular:  Positive for chest pain (One episode last night, one this AM, mild, resolved). Negative for leg swelling.  Neurological:  Negative for weakness.    Physical Exam Updated Vital Signs BP (!) 120/56 (BP Location: Left Arm)   Pulse (!) 55   Temp 97.6 F (36.4 C) (Oral)   Resp 16   Ht '5\' 7"'$  (1.702 m)   Wt 81.6 kg   SpO2 99%   BMI 28.19 kg/m  Physical Exam Vitals and nursing note reviewed.  Constitutional:      General: He is not in acute distress.    Appearance: He is well-developed.  HENT:     Head: Normocephalic and atraumatic.  Eyes:     Extraocular Movements: Extraocular movements intact.     Conjunctiva/sclera: Conjunctivae normal.  Cardiovascular:     Rate and Rhythm: Regular rhythm. Bradycardia present.     Heart sounds: No murmur heard. Pulmonary:     Effort: Pulmonary effort is normal. No respiratory distress.     Breath sounds: Normal breath sounds.  Chest:     Chest wall: No tenderness.  Abdominal:     Palpations: Abdomen is soft.     Tenderness: There is no abdominal tenderness.  Musculoskeletal:        General: No swelling.     Cervical back: Neck supple.     Right lower leg: No edema.     Left lower leg: No edema.  Skin:    General: Skin is warm and dry.     Capillary Refill: Capillary refill takes less than 2 seconds.  Neurological:     Mental Status: He is alert.  Psychiatric:        Mood and Affect: Mood normal.     ED Results / Procedures / Treatments   Labs (all labs ordered are listed, but only abnormal results are displayed) Labs Reviewed  BASIC METABOLIC PANEL - Abnormal; Notable for the following components:      Result Value    Creatinine, Ser 1.43 (*)    GFR, Estimated 55 (*)    All other components within normal limits  BRAIN NATRIURETIC PEPTIDE - Abnormal; Notable for the following components:   B Natriuretic Peptide 737.6 (*)    All other components within normal limits  MAGNESIUM - Abnormal; Notable for the following components:   Magnesium 1.6 (*)    All other components within normal limits  TROPONIN I (HIGH SENSITIVITY) - Abnormal; Notable for the following components:   Troponin I (  High Sensitivity) 41 (*)    All other components within normal limits  TROPONIN I (HIGH SENSITIVITY) - Abnormal; Notable for the following components:   Troponin I (High Sensitivity) 42 (*)    All other components within normal limits  CBC WITH DIFFERENTIAL/PLATELET    EKG EKG Interpretation  Date/Time:  Wednesday November 07 2022 15:57:01 EST Ventricular Rate:  34 PR Interval:  233 QRS Duration: 109 QT Interval:  509 QTC Calculation: 383 R Axis:   -14 Text Interpretation: Second degree heart block Probable left atrial enlargement LVH with secondary repolarization abnormality Inferior infarct, old Anterolateral infarct, age indeterminate Confirmed by Gareth Morgan [phone]) on 11/07/2022 6:33:38 PM  Radiology DG Chest 2 View  Result Date: 11/07/2022 CLINICAL DATA:  Shortness of breath EXAM: CHEST - 2 VIEW COMPARISON:  12/08/2020, 01/11/2020 FINDINGS: Post sternotomy changes and atrial appendage clip. Chronic curvilinear opacity at the right hilar area. Upper normal cardiac size. Mild central vascular congestion. No consolidation or effusion. Aortic atherosclerosis. IMPRESSION: Borderline cardiomegaly with slight central congestion. Electronically Signed   By: Donavan Foil M.D.   On: 11/07/2022 15:54    Procedures Procedures    Medications Ordered in ED Medications  hydrALAZINE (APRESOLINE) injection 5 mg (5 mg Intravenous Given 11/07/22 1739)    ED Course/ Medical Decision Making/ A&P                              Medical Decision Making Amount and/or Complexity of Data Reviewed Labs: ordered. Radiology: ordered.  Risk Prescription drug management.   This patient presents to the ED for concern of bradycardia, this involves an extensive number of treatment options, and is a complaint that carries with it a high risk of complications and morbidity.  The differential diagnosis includes dysrhythmia, electrolyte abnormality, ACS, others   Co morbidities that complicate the patient evaluation  History of Mobitz type I, CABG, NSTEMI   Additional history obtained:  Additional history obtained from EMS External records from outside source obtained and reviewed including notes from outside emergency departments from January 13 with the patient was seen and diagnosed with influenza A   Lab Tests:  I Ordered, and personally interpreted labs.  The pertinent results include: Magnesium 1.6, initial troponin 41, BNP 737.6, creatinine 1.43 (baseline around 1.3), unremarkable CBC   Imaging Studies ordered:  I ordered imaging studies including chest x-ray I independently visualized and interpreted imaging which showed borderline cardiomegaly with slight central congestion I agree with the radiologist interpretation   Cardiac Monitoring: / EKG:  The patient was maintained on a cardiac monitor.  I personally viewed and interpreted the cardiac monitored which showed an underlying rhythm of: Second-degree type II Mobitz; bradycardic   Consultations Obtained:  I requested consultation with the cardiologist, Dr. Ali Lowe, and discussed lab and imaging findings as well as pertinent plan - they recommend: discharge home, discontinue Coreg I discussed the case with family medicine who agreed to admit the patient for observation.    Problem List / ED Course / Critical interventions / Medication management   I ordered medication including hydralazine for HTN  Reevaluation of the patient after these  medicines showed that the patient improved I have reviewed the patients home medicines and have made adjustments as needed   Social Determinants of Health:  Patient has difficulty arranging transportation   Test / Admission - Considered:  Patient has elevated BNP, some concern for possible mild fluid overload. Patient has also  continued to have episodes of bradycardia into the 40s. Discussed case with my attending physician who agreed that it would be best to admit the patient overnight for possible diuresis, observation of bradycardia. Family medicine agrees to admit patient.          Final Clinical Impression(s) / ED Diagnoses Final diagnoses:  Elevated brain natriuretic peptide (BNP) level  Bradycardia    Rx / DC Orders ED Discharge Orders     None         Ronny Bacon 11/07/22 2320    Gareth Morgan, MD 11/08/22 [phone]

## 2022-11-08 ENCOUNTER — Observation Stay (HOSPITAL_BASED_OUTPATIENT_CLINIC_OR_DEPARTMENT_OTHER): Payer: 59

## 2022-11-08 ENCOUNTER — Encounter (HOSPITAL_COMMUNITY): Payer: Self-pay | Admitting: Psychiatry

## 2022-11-08 DIAGNOSIS — R001 Bradycardia, unspecified: Secondary | ICD-10-CM

## 2022-11-08 DIAGNOSIS — I5022 Chronic systolic (congestive) heart failure: Secondary | ICD-10-CM | POA: Diagnosis present

## 2022-11-08 DIAGNOSIS — I5020 Unspecified systolic (congestive) heart failure: Secondary | ICD-10-CM | POA: Diagnosis present

## 2022-11-08 DIAGNOSIS — R7989 Other specified abnormal findings of blood chemistry: Secondary | ICD-10-CM

## 2022-11-08 DIAGNOSIS — I10 Essential (primary) hypertension: Secondary | ICD-10-CM

## 2022-11-08 LAB — ECHOCARDIOGRAM COMPLETE
AR max vel: 2.27 cm2
AV Area VTI: 2.1 cm2
AV Area mean vel: 2.19 cm2
AV Mean grad: 4 mmHg
AV Peak grad: 7.1 mmHg
Ao pk vel: 1.33 m/s
Area-P 1/2: 2.99 cm2
Height: 67 in
S' Lateral: 4 cm
Weight: 2713.6 oz

## 2022-11-08 LAB — COMPREHENSIVE METABOLIC PANEL
ALT: 9 U/L (ref 0–44)
AST: 17 U/L (ref 15–41)
Albumin: 2.3 g/dL — ABNORMAL LOW (ref 3.5–5.0)
Alkaline Phosphatase: 46 U/L (ref 38–126)
Anion gap: 10 (ref 5–15)
BUN: 23 mg/dL (ref 8–23)
CO2: 24 mmol/L (ref 22–32)
Calcium: 8.9 mg/dL (ref 8.9–10.3)
Chloride: 105 mmol/L (ref 98–111)
Creatinine, Ser: 1.51 mg/dL — ABNORMAL HIGH (ref 0.61–1.24)
GFR, Estimated: 52 mL/min — ABNORMAL LOW (ref >60)
Glucose, Bld: 86 mg/dL (ref 70–99)
Potassium: 4.2 mmol/L (ref 3.5–5.1)
Sodium: 139 mmol/L (ref 135–145)
Total Bilirubin: 0.4 mg/dL (ref 0.3–1.2)
Total Protein: 6.3 g/dL — ABNORMAL LOW (ref 6.5–8.1)

## 2022-11-08 LAB — HEMOGLOBIN A1C
Hgb A1c MFr Bld: 6 % — ABNORMAL HIGH (ref 4.8–5.6)
Mean Plasma Glucose: 125.5 mg/dL

## 2022-11-08 LAB — MAGNESIUM: Magnesium: 2.1 mg/dL (ref 1.7–2.4)

## 2022-11-08 LAB — HIV ANTIBODY (ROUTINE TESTING W REFLEX): HIV Screen 4th Generation wRfx: NONREACTIVE

## 2022-11-08 MED ORDER — SODIUM BICARBONATE 650 MG PO TABS
1300.0000 mg | ORAL_TABLET | Freq: Two times a day (BID) | ORAL | Status: DC
  Administered 2022-11-08 – 2022-11-09 (×3): 1300 mg via ORAL
  Filled 2022-11-08 (×3): qty 2

## 2022-11-08 MED ORDER — RIVAROXABAN 2.5 MG PO TABS
2.5000 mg | ORAL_TABLET | Freq: Two times a day (BID) | ORAL | Status: DC
  Administered 2022-11-08 – 2022-11-09 (×3): 2.5 mg via ORAL
  Filled 2022-11-08 (×3): qty 1

## 2022-11-08 MED ORDER — OLOPATADINE HCL 0.1 % OP SOLN
1.0000 [drp] | Freq: Two times a day (BID) | OPHTHALMIC | Status: DC
  Administered 2022-11-08 – 2022-11-09 (×3): 1 [drp] via OPHTHALMIC
  Filled 2022-11-08: qty 5

## 2022-11-08 MED ORDER — ASPIRIN 81 MG PO TBEC
81.0000 mg | DELAYED_RELEASE_TABLET | Freq: Every day | ORAL | Status: DC
  Administered 2022-11-08 – 2022-11-09 (×2): 81 mg via ORAL
  Filled 2022-11-08 (×2): qty 1

## 2022-11-08 MED ORDER — HYDRALAZINE HCL 10 MG PO TABS
10.0000 mg | ORAL_TABLET | Freq: Three times a day (TID) | ORAL | Status: DC
  Administered 2022-11-08 – 2022-11-09 (×3): 10 mg via ORAL
  Filled 2022-11-08 (×3): qty 1

## 2022-11-08 MED ORDER — TACROLIMUS 1 MG PO CAPS
3.0000 mg | ORAL_CAPSULE | Freq: Every day | ORAL | Status: DC
  Administered 2022-11-08 – 2022-11-09 (×2): 3 mg via ORAL
  Filled 2022-11-08 (×2): qty 3

## 2022-11-08 MED ORDER — FUROSEMIDE 10 MG/ML IJ SOLN
20.0000 mg | Freq: Once | INTRAMUSCULAR | Status: AC
  Administered 2022-11-08: 20 mg via INTRAVENOUS
  Filled 2022-11-08: qty 2

## 2022-11-08 MED ORDER — ROSUVASTATIN CALCIUM 20 MG PO TABS
20.0000 mg | ORAL_TABLET | Freq: Every day | ORAL | Status: DC
  Administered 2022-11-08 – 2022-11-09 (×2): 20 mg via ORAL
  Filled 2022-11-08 (×2): qty 1

## 2022-11-08 MED ORDER — PANTOPRAZOLE SODIUM 40 MG PO TBEC
40.0000 mg | DELAYED_RELEASE_TABLET | Freq: Every day | ORAL | Status: DC
  Administered 2022-11-08 – 2022-11-09 (×2): 40 mg via ORAL
  Filled 2022-11-08 (×2): qty 1

## 2022-11-08 MED ORDER — MAGNESIUM SULFATE 2 GM/50ML IV SOLN
2.0000 g | Freq: Once | INTRAVENOUS | Status: AC
  Administered 2022-11-08: 2 g via INTRAVENOUS
  Filled 2022-11-08: qty 50

## 2022-11-08 MED ORDER — MYCOPHENOLATE SODIUM 180 MG PO TBEC
720.0000 mg | DELAYED_RELEASE_TABLET | Freq: Two times a day (BID) | ORAL | Status: DC
  Administered 2022-11-08 – 2022-11-09 (×3): 720 mg via ORAL
  Filled 2022-11-08 (×3): qty 4

## 2022-11-08 MED ORDER — TAMSULOSIN HCL 0.4 MG PO CAPS
0.4000 mg | ORAL_CAPSULE | Freq: Every day | ORAL | Status: DC
  Administered 2022-11-08: 0.4 mg via ORAL
  Filled 2022-11-08 (×2): qty 1

## 2022-11-08 MED ORDER — TACROLIMUS 1 MG PO CAPS
2.0000 mg | ORAL_CAPSULE | Freq: Every day | ORAL | Status: DC
  Administered 2022-11-08 (×2): 2 mg via ORAL
  Filled 2022-11-08 (×2): qty 2

## 2022-11-08 MED ORDER — PREDNISONE 5 MG PO TABS
5.0000 mg | ORAL_TABLET | Freq: Every day | ORAL | Status: DC
  Administered 2022-11-08 – 2022-11-09 (×2): 5 mg via ORAL
  Filled 2022-11-08 (×2): qty 1

## 2022-11-08 NOTE — Assessment & Plan Note (Addendum)
L/R heart catheterization in 2013 showed severe calcific 3 vessel CAD which was managed medically. 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 Trop mildly elevated this admission 41>42 which appears chronic. - continue aspirin 81 mg daily - continue rivaroxaban 2.5 mg BID - outpatient cardiology follow-up

## 2022-11-08 NOTE — Progress Notes (Signed)
Spoke with Dr. 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 He wanted me to reach out to family to see if this is a new issue for him. Called daughter x 4 times and no answer. Discussed with nurse and patient's daughter does not have transportation so would not be able to bring him to an appointment today either. Discussed with Dr. Talbert Forest again and he will see patient after his clinic today.

## 2022-11-08 NOTE — Consult Note (Addendum)
  Ophthalmology Consult  This is a 62 yo male with h/o renal transplant, HTN, Mobitz HB, PAD, admitted for bradycardia.  Pt has a h/o bleeding in the right eye for which he had injections in the past and was placed on drops for high pressure in the right eye.  Pt no longer uses his drops and has not seen eye doctor in 2 years.  Pt has been blind in the right eye for 2 years.  Pt has mild discomfort at times but overall states eye does not bother him.  On exam pt is NLP in the right eye and 20/40 in the left eye.  Pupil in right eye is dilated and fixed and left eye is reactive to light.  Extraocular motility is intact in both eyes.  Visual field full to confrontation in th left eye.  IOP was 35 in the right eye and 14 in the left.  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  No corneal abrasions and no infection.  Pt to follow up with their Ophthalmologist at [facility] once discharged to see if IOP drops needed.  Will not help vision but may help with comfort care if patient develops pain. Since patient complaining of mild irritation and pain and IOP is elevated will start Brimonidine in the right eye 3x a day until sees their eye doctor at [facility].  Pt needs to see the doctor in 1-2 weeks for full exam since its been 2 years. If can not get in with their eye doctor pt can follow up in my office in 1-2 weeks.  Cataract left eye: [place] to follow as outpatient   Thank you for allowing me to participate in the care of this  patient.  Please feel free to contact me if you have any concerns.  Darleen Crocker, M.D. [facility] [place]  [place]

## 2022-11-08 NOTE — Assessment & Plan Note (Addendum)
Echo 05/31/2019 showed EF 35 - 40%. On admission, BNP 737.6 and CXR showed borderline cardiomegaly with slight central congestion. - give 20 mg IV furosemide once - strict I/Os - daily weights - echo ordered

## 2022-11-08 NOTE — Assessment & Plan Note (Addendum)
Patient initially presented with bradycardia in the 40s, found to be in 2:1 2nd degree AV block. Resolved upon cardiology evaluation in the ED but then recurred with HRs into the 30s intermittently. - admit to [facility] telemetry attending Dr. Thompson Grayer - continuous cardiac monitoring - stopped home carvedilol 6.25 BID in ED - re-consult cardiology in AM

## 2022-11-08 NOTE — TOC Progression Note (Addendum)
Transition of Care [facility]) - Progression Note    Patient Details  Name: Theodore Arellano MRN: [id number] Date of Birth: September 27, 1961  [facility]) CM/SW Contact  Zenon Mayo, RN Phone Number: 11/08/2022, 11:07 AM  Clinical Narrative:    From home with a friend, , presents with Bradycardia, has renal txpt hx.  TOC following.        Expected Discharge Plan and Services                                               Social Determinants of Health (SDOH) Interventions SDOH Screenings   Tobacco Use: High Risk (11/08/2022)    Readmission Risk Interventions     No data to display

## 2022-11-08 NOTE — Assessment & Plan Note (Signed)
Mg 1.6. Given '2mg'$  IV mag - f/u Mg in AM

## 2022-11-08 NOTE — Assessment & Plan Note (Addendum)
Patient has chronic HTN. In ED patient developed systolic BPs in the 004Y. At time of evaluation patient was asymptomatic. Patient received hydralazine in ED - f/u med rec by pharmacy to clarify outpatient medication regimen

## 2022-11-08 NOTE — Plan of Care (Signed)
  Problem: Clinical Measurements: Goal: Diagnostic test results will improve Outcome: Progressing   Problem: Activity: Goal: Risk for activity intolerance will decrease Outcome: Progressing   

## 2022-11-08 NOTE — Progress Notes (Signed)
Echocardiogram 2D Echocardiogram has been performed.  Theodore Arellano 11/08/2022, 11:43 AM

## 2022-11-08 NOTE — ED Notes (Signed)
ED TO INPATIENT HANDOFF REPORT  ED Nurse Name and Phone #: Rose Fillers [phone]  S Name/Age/Gender Theodore Arellano 62 y.o. male Room/Bed: RESUSC/RESUSC  Code Status   Code Status: Full Code  Home/SNF/Other Home Patient oriented to: self, place, time, and situation Is this baseline? Yes   Triage Complete: Triage complete  Chief Complaint Bradycardia [R00.1]  Triage Note Pt BIB EMS from PCP for bradycardia. Pt is in AV block. 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 Pt was pale, diaphoretic, and was found to be in heart block. Pt is axox4. Pt 539 systolic. Pt had kidney transplant 2021. Pt is on beta blocker   Allergies Allergies  Allergen Reactions   Percocet [Oxycodone-Acetaminophen] Hives and Itching   Vicodin [Hydrocodone-Acetaminophen] Hives and Itching   Shellfish Allergy Hives   Chocolate Flavor Hives   Chocolate Hives   Tomato Hives    Level of Care/Admitting Diagnosis ED Disposition     ED Disposition  Admit   Condition  --   Comment  Hospital Area: [place] [[id number]]  Level of Care: Telemetry Medical [104]  May place patient in observation at [facility] [place] or [place] if equivalent level of care is available:: No  Covid Evaluation: Asymptomatic - no recent exposure (last 10 days) testing not required  Diagnosis: Bradycardia [191850]  Admitting Physician: Camelia Phenes [[id number]]  Attending Physician: Lenoria Chime [[id number]]          B Medical/Surgery History Past Medical History:  Diagnosis Date   Anxiety    Arthritis    "lower back" (11/28/2017)   CAD (coronary artery disease)    Chronic lower back pain    H/O immunosuppressive therapy    chronic/notes 11/28/2017   History of gout    "before kidney transport" (11/28/2017)   Hyperlipidemia    Hypertension    Kidney disease    s/p kidney transplant 2011; "not on dialysis now" (11/28/2017)   Tobacco use    Past Surgical History:  Procedure Laterality Date   ABDOMINAL AORTOGRAM W/LOWER  EXTREMITY Bilateral 12/29/2020   Procedure: ABDOMINAL AORTOGRAM W/LOWER EXTREMITY;  Surgeon: Marty Heck, MD;  Location: [facility];  Service: Cardiovascular;  Laterality: Bilateral;   ARTERIOVENOUS GRAFT PLACEMENT Right 04/2007   /notes 02/01/2011   AV FISTULA PLACEMENT Left 12/20/2005; 12/2006   Archie Endo 02/13/2011; Archie Endo 02/13/2011   AXILLARY-FEMORAL BYPASS GRAFT Right 01/03/2021   Procedure: RIGHT AXILLARY TO PROFUNDA FEMORAL BYPASS;  Surgeon: Rosetta Posner, MD;  Location: [facility];  Service: Vascular;  Laterality: Right;   CARDIAC CATHETERIZATION     FEMORAL-POPLITEAL BYPASS GRAFT Right 01/03/2021   Procedure: RIGHT FEMORAL TO BELOW KNEE POPLITEAL ARTERY BYPASS;  Surgeon: Rosetta Posner, MD;  Location: [facility];  Service: Vascular;  Laterality: Right;   HD access procedures     INGUINAL HERNIA REPAIR Left    KIDNEY TRANSPLANT  2011   LEFT HEART CATH AND CORONARY ANGIOGRAPHY N/A 11/29/2017   Procedure: LEFT HEART CATH AND CORONARY ANGIOGRAPHY;  Surgeon: Leonie Man, MD;  Location: [facility];  Service: Cardiovascular;  Laterality: N/A;   LEFT HEART CATH AND CORONARY ANGIOGRAPHY N/A 06/01/2019   Procedure: LEFT HEART CATH AND CORONARY ANGIOGRAPHY;  Surgeon: Burnell Blanks, MD;  Location: [facility];  Service: Cardiovascular;  Laterality: N/A;   LEFT HEART CATHETERIZATION WITH CORONARY ANGIOGRAM  03/11/2012   Procedure: LEFT HEART CATHETERIZATION WITH CORONARY ANGIOGRAM;  Surgeon: Jolaine Artist, MD;  Location:  [facility];  Service: Cardiovascular;;   RIGHT HEART CATHETERIZATION N/A 03/11/2012   Procedure: RIGHT HEART CATH;  Surgeon: Jolaine Artist, MD;  [facility]: [facility];  Service: Cardiovascular;  Laterality: N/A;   THROMBECTOMY Right 12/2007   Archie Endo 02/01/2011   THROMBECTOMY / ARTERIOVENOUS GRAFT REVISION Left 12/2006   Archie Endo 02/13/2011   THROMBECTOMY / ARTERIOVENOUS GRAFT REVISION Right 07/2007; 10/2007;01/2008;   Archie Endo 01/31/2011; Archie Endo 02/01/2011;  Archie Endo 01/31/2011   THROMBECTOMY AND REVISION OF ARTERIOVENTOUS (AV) GORETEX  GRAFT  03/2007 X 2   Archie Endo 02/01/2011     A IV Location/Drains/Wounds Patient Lines/Drains/Airways Status     Active Line/Drains/Airways     Name Placement date Placement time Site Days   Peripheral IV 01/03/21 Anterior;Left;Medial Forearm 01/03/21  0945  Forearm  674   Peripheral IV  Anterior;Left Hand --  --  Hand  --   Peripheral IV 11/07/22 22 G 1.75" Anterior;Left;Lateral Forearm 11/07/22  1546  Forearm  1   Fistula / Graft Right Upper arm 11/28/17  1900  Upper arm  1806   Negative Pressure Wound Therapy Leg Left;Distal 11/02/19  0800  --  1102   Incision (Closed) 01/03/21 Chest Right 01/03/21  1546  -- 674   Incision (Closed) 01/03/21 Groin Right 01/03/21  1546  -- 674   Incision (Closed) 01/03/21 Leg Right 01/03/21  1546  -- 674   Wound / Incision (Open or Dehisced) 11/08/19 Non-pressure wound Leg Left;Lower;Circumferential 11/08/19  1900  Leg  1096   Wound / Incision (Open or Dehisced) 12/27/20 Pretibial Right vertical open wound with black eschar and surrounding erythema 12/27/20  2350  Pretibial  681            Intake/Output Last 24 hours No intake or output data in the 24 hours ending 11/08/22 0037  Labs/Imaging Results for orders placed or performed during the hospital encounter of 11/07/22 (from the past 48 hour(s))  Basic metabolic panel     Status: Abnormal   Collection Time: 11/07/22  3:28 PM  Result Value Ref Range   Sodium 139 135 - 145 mmol/L   Potassium 3.9 3.5 - 5.1 mmol/L   Chloride 103 98 - 111 mmol/L   CO2 24 22 - 32 mmol/L   Glucose, Bld 91 70 - 99 mg/dL    Comment: Glucose reference range applies only to samples taken after fasting for at least 8 hours.   BUN 23 8 - 23 mg/dL   Creatinine, Ser 1.43 (H) 0.61 - 1.24 mg/dL   Calcium 9.3 8.9 - 10.3 mg/dL   GFR, Estimated 55 (L) >60 mL/min    Comment: (NOTE) Calculated using the CKD-EPI Creatinine Equation (2021)    Anion  gap 12 5 - 15    Comment: Performed at [facility] [address] [place], [place] [address]  CBC with Differential     Status: None   Collection Time: 11/07/22  3:28 PM  Result Value Ref Range   WBC 7.2 4.0 - 10.5 K/uL   RBC 4.85 4.22 - 5.81 MIL/uL   Hemoglobin 13.6 13.0 - 17.0 g/dL   HCT 42.6 39.0 - 52.0 %   MCV 87.8 80.0 - 100.0 fL   MCH 28.0 26.0 - 34.0 pg   MCHC 31.9 30.0 - 36.0 g/dL   RDW 15.3 11.5 - 15.5 %   Platelets 285 150 - 400 K/uL   nRBC 0.0 0.0 - 0.2 %   Neutrophils Relative % 78 %   Neutro  Abs 5.6 1.7 - 7.7 K/uL   Lymphocytes Relative 12 %   Lymphs Abs 0.9 0.7 - 4.0 K/uL   Monocytes Relative 6 %   Monocytes Absolute 0.4 0.1 - 1.0 K/uL   Eosinophils Relative 4 %   Eosinophils Absolute 0.3 0.0 - 0.5 K/uL   Basophils Relative 0 %   Basophils Absolute 0.0 0.0 - 0.1 K/uL   Immature Granulocytes 0 %   Abs Immature Granulocytes 0.03 0.00 - 0.07 K/uL    Comment: Performed at [facility] [address], [place], [place] [address]  Brain natriuretic peptide     Status: Abnormal   Collection Time: 11/07/22  3:28 PM  Result Value Ref Range   B Natriuretic Peptide 737.6 (H) 0.0 - 100.0 pg/mL    Comment: Performed at [facility] [address], [place], [place] [address]  Troponin I (High Sensitivity)     Status: Abnormal   Collection Time: 11/07/22  3:28 PM  Result Value Ref Range   Troponin I (High Sensitivity) 41 (H) <18 ng/L    Comment: (NOTE) Elevated high sensitivity troponin I (hsTnI) values and significant  changes across serial measurements may suggest ACS but many other  chronic and acute conditions are known to elevate hsTnI results.  Refer to the "Links" section for chest pain algorithms and additional  guidance. Performed at [facility], [place] [address], [place], [place] [address]   Magnesium     Status: Abnormal   Collection Time: 11/07/22  3:28 PM  Result Value Ref Range   Magnesium 1.6 (L) 1.7 - 2.4 mg/dL    Comment:  Performed at [facility] [address], [place], [place] [address]  Troponin I (High Sensitivity)     Status: Abnormal   Collection Time: 11/07/22  5:40 PM  Result Value Ref Range   Troponin I (High Sensitivity) 42 (H) <18 ng/L    Comment: (NOTE) Elevated high sensitivity troponin I (hsTnI) values and significant  changes across serial measurements may suggest ACS but many other  chronic and acute conditions are known to elevate hsTnI results.  Refer to the "Links" section for chest pain algorithms and additional  guidance. Performed at [facility], [place] [address], [place], [place] [address]    DG Chest 2 View  Result Date: 11/07/2022 CLINICAL DATA:  Shortness of breath EXAM: CHEST - 2 VIEW COMPARISON:  12/08/2020, 01/11/2020 FINDINGS: Post sternotomy changes and atrial appendage clip. Chronic curvilinear opacity at the right hilar area. Upper normal cardiac size. Mild central vascular congestion. No consolidation or effusion. Aortic atherosclerosis. IMPRESSION: Borderline cardiomegaly with slight central congestion. Electronically Signed   By: Donavan Foil M.D.   On: 11/07/2022 15:54    Pending Labs Unresulted Labs (From admission, onward)     Start     Ordered   11/08/22 0500  Comprehensive metabolic panel  Tomorrow morning,   R       Question:  Specimen collection method  Answer:  IV Team=IV Team collect   11/08/22 0020   11/08/22 0500  Magnesium  Tomorrow morning,   R       Question:  Specimen collection method  Answer:  IV Team=IV Team collect   11/08/22 0020   11/08/22 0500  Hemoglobin A1c  Tomorrow morning,   R       Question:  Specimen collection method  Answer:  IV Team=IV Team collect   11/08/22 0029   11/08/22 0016  HIV Antibody (routine testing w rflx)  (HIV Antibody (Routine testing w reflex) panel)  Once,   R       Question:  Specimen collection method  Answer:  IV Team=IV Team collect   11/08/22 0020            Vitals/Pain Today's Vitals    11/07/22 2300 11/07/22 2313 11/07/22 2330 11/07/22 2340  BP: (!) 120/56  132/63   Pulse: (!) 55  63   Resp: 16  18   Temp:    98.4 F (36.9 C)  TempSrc:    Oral  SpO2: 99%  100%   Weight:      Height:      PainSc: Asleep Asleep 0-No pain     Isolation Precautions No active isolations  Medications Medications  aspirin EC tablet 81 mg (has no administration in time range)  rosuvastatin (CRESTOR) tablet 20 mg (has no administration in time range)  predniSONE (DELTASONE) tablet 5 mg (has no administration in time range)  pantoprazole (PROTONIX) EC tablet 40 mg (has no administration in time range)  sodium bicarbonate tablet 1,300 mg (has no administration in time range)  mycophenolate (MYFORTIC) EC tablet 720 mg (has no administration in time range)  tacrolimus (PROGRAF) capsule 3 mg (has no administration in time range)    And  tacrolimus (PROGRAF) capsule 2 mg (has no administration in time range)  tamsulosin (FLOMAX) capsule 0.4 mg (has no administration in time range)  rivaroxaban (XARELTO) tablet 2.5 mg (has no administration in time range)  magnesium sulfate IVPB 2 g 50 mL (has no administration in time range)  hydrALAZINE (APRESOLINE) injection 5 mg (5 mg Intravenous Given 11/07/22 1739)    Mobility walks     Focused Assessments     R Recommendations: See Admitting Provider Note  Report given to:   Additional Notes:

## 2022-11-08 NOTE — Progress Notes (Addendum)
FMTS Interim Progress Note  S: Patient reports feeling his normal state of health, aside from right eye irritation.  O: BP (!) 182/93 (BP Location: Left Arm)   Pulse 61   Temp 98.1 F (36.7 C) (Oral)   Resp 18   Ht '5\' 7"'$  (1.702 m)   Wt 76.9 kg   SpO2 100%   BMI 26.56 kg/m   General: NAD, chronically ill-appearing Cardio: Bradycardic, occasional premature heartbeat on auscultation.  No murmurs. Respiratory: CTAB, normal breathing on room air Eyes: Right eye appears to have cataract, possible hypopyon, conjunctival injection.   A/P: Bradycardia - Hold carvedilol - Ambulate  HTN Pending official med rec.  Holding carvedilol.  Most recent blood pressure 183/97. - Consider Losartan  HFrEF - Strict I's and O's - Follow-up echo  Coronary artery disease - Continue aspirin, rivaroxaban - Outpatient cardiology follow-up  Hypomagnesia Repleted, resolved  Right eye pruritus Right eyes been itching, patient endorses using eyedrops at home (he cannot recall the names).  Denies any pain or headaches (lower concern for acute angle glaucoma). 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  On exam appears to have cataract, conjunctival injection and palpebral injection.  Possible Hypopyon-would benefit from ophthalmology follow-up.  Differential includes conjunctivitis, corneal abrasion, keratitis. -PATANOL drops for comfort  Leslie Dales, DO 11/08/2022, 8:13 AM PGY-1, [facility] pager [phone]

## 2022-11-09 ENCOUNTER — Other Ambulatory Visit (HOSPITAL_COMMUNITY): Payer: Self-pay

## 2022-11-09 ENCOUNTER — Other Ambulatory Visit: Payer: Self-pay | Admitting: Student

## 2022-11-09 DIAGNOSIS — R001 Bradycardia, unspecified: Secondary | ICD-10-CM | POA: Diagnosis not present

## 2022-11-09 MED ORDER — BRIMONIDINE TARTRATE 0.2 % OP SOLN
1.0000 [drp] | Freq: Three times a day (TID) | OPHTHALMIC | 12 refills | Status: DC
  Filled 2022-11-09: qty 10, 67d supply, fill #0

## 2022-11-09 MED ORDER — BRIMONIDINE TARTRATE 0.2 % OP SOLN
1.0000 [drp] | Freq: Three times a day (TID) | OPHTHALMIC | Status: DC
  Filled 2022-11-09: qty 5

## 2022-11-09 MED ORDER — XARELTO 2.5 MG PO TABS: 2.5000 mg | ORAL_TABLET | Freq: Two times a day (BID) | ORAL | Status: DC

## 2022-11-09 NOTE — Care Management Obs Status (Signed)
[facility] NOTIFICATION   Patient Details  Name: Theodore Arellano MRN: [id number] Date of Birth: 28-Nov-1960   Medicare Observation Status Notification Given:  Yes    Zenon Mayo, RN 11/09/2022, 10:21 AM

## 2022-11-09 NOTE — Discharge Summary (Addendum)
[facility] Discharge Summary  Patient name: Theodore Arellano Medical record number: [id number] Date of birth: 1961/05/28 Age: 62 y.o. Gender: male Date of Admission: 11/07/2022  Date of Discharge: 11/09/2022 Admitting Physician: Camelia Phenes, MD  Primary Care Provider: [name], MD Consultants: Cardiology  Indication for Hospitalization: Hypertensive urgency w/ bradycardia  Brief Hospital Course:  Theodore Arellano is a 62 year old male who presented from PCP office earlier this morning when noted to have heart rates in the low 40s, and subsequently developed systolic blood pressures greater than 180 while in the ED.  Possible history includes type 2 diabetes, Mobitz type II heart block, atrial fibrillation, HLD, CAD, PAD, anemia, and blindness of right eye.  His hospital course is outlined below.  Bradycardia Initially presented with bradycardia in the 40s, found to be in 2: 1 second-degree AV block.  Resolved upon cardiology evaluation in the ED, and then recurred in the 41s.  Patient was taking carvedilol, which was stopped during this admission.  After discontinuation of carvedilol, heart rate fluctuated between 40-70.  Cardiology recommended outpatient follow-up.  HTN (hypertension) Patient has chronic HTN. In ED patient developed systolic blood pressures in the 200s.  He remained asymptomatic and neurologically intact during hospitalization.  Received hydralazine, which controlled blood pressures while inpatient.  Per patient, he discontinued amlodipine outpatient and is only taking losartan.  Systolics remained between 150-160 without further hydralazine or other blood pressure medication.  He reports to getting nephrology labs this month, and will speak with nephrologist soon.  Patient was discharged home on his home losartan, which we anticipate will lower his blood pressures.  He was in stable and good condition at time of discharge.  Congestive heart  failure Echocardiogram obtained during admission which showed EF of 30-35%, akinesis of anteroseptal wall and apex, hypokinesis of inferior wall with overall moderate severe LVEF dysfunction.  This is similar to his echocardiogram in 2020, which showed severe akinesis and ejection fraction of 35-40%.  Remained euvolemic and asymptomatic during admission. Recommend follow-up with cardiologist outpatient.  Hypomagnesemia Mg 1.6, given to mg IV mag.  Stabilized during admission.  Right eye discomfort Legally blind in right eye, s/p aneurysm.  Cataract present.  Patient endorsed increased itching and discomfort in right eye.  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  They started brimonidine eyedrops 3 times a day until patient sees eye doctor at [facility].  Patient would like to make his own eye doctor appointment.   Other chronic medical conditions were managed medically and stable at time of discharge.  Follow-up recommendations Reassess patient's blood pressure, may need more coverage than losartan alone. Ensure patient has appropriate cardiology follow-up,next appt in April 2024, but he may need sooner follow-up. Ensure patient has adequate ophthalmology follow-up. 4. At PCP follow up recommend repeat BMP (restarted home losartan at discharge)  Discharge Diagnoses/Problem List:  * Bradycardia HTN (hypertension) HFrEF (heart failure with reduced ejection fraction) ([place]) Coronary artery disease Hypomagnesemia  Disposition: Home  Discharge Condition: Stable  Discharge Exam:  General: NAD, resting comfortably in bed Cardio: Sinus bradycardia, no murmurs Respiratory: CTAB, normal work of breathing on room air Extremities: Moves all 4 extremities, no peripheral edema Skin: Warm and dry  Significant Labs and Imaging:  Recent Labs  Lab 11/07/22 1528  WBC 7.2  HGB 13.6  HCT 42.6  PLT 285   Recent  Labs  Lab 11/07/22 1528 11/08/22 0136  NA 139 139  K 3.9 4.2  CL 103 105  CO2 24 24  GLUCOSE 91 86  BUN 23 23  CREATININE 1.43* 1.51*  CALCIUM 9.3 8.9  MG 1.6* 2.1  ALKPHOS  --  46  AST  --  17  ALT  --  9  ALBUMIN  --  2.3*    Results/Tests Pending at Time of Discharge:  ECHOCARDIOGRAM COMPLETE  Result Date: 11/08/2022 IMPRESSIONS  1. Akinesis of the anteroseptal wall and apex; hypokinesis of the inferior wall; overall moderate to severe LV dysfunction.  2. Left ventricular ejection fraction, by estimation, is 30 to 35%. The left ventricle has moderately decreased function. The left ventricle demonstrates regional wall motion abnormalities (see scoring diagram/findings for description). The left ventricular internal cavity size was mildly dilated. There is mild left ventricular hypertrophy. Left ventricular diastolic parameters are consistent with Grade I diastolic dysfunction (impaired relaxation). Elevated left atrial pressure.  3. Right ventricular systolic function is normal. The right ventricular size is normal.  4. The mitral valve is normal in structure. Mild mitral valve regurgitation. No evidence of mitral stenosis.  5. The aortic valve is tricuspid. Aortic valve regurgitation is mild. Aortic valve sclerosis/calcification is present, without any evidence of aortic stenosis.  6. Aortic dilatation noted. There is mild dilatation of the aortic root, measuring 40 mm.  7. The inferior vena cava is normal in size with greater than 50% respiratory variability, suggesting right atrial pressure of 3 mmHg.   Discharge Medications:  Allergies as of 11/09/2022       Reactions   Percocet [oxycodone-acetaminophen] Hives, Itching   Vicodin [hydrocodone-acetaminophen] Hives, Itching   Shellfish Allergy Hives   Chocolate Flavor Hives   Chocolate Hives   Tomato Hives        Medication List     STOP taking these medications    amLODipine 10 MG tablet Commonly known as: NORVASC    carvedilol 3.125 MG tablet Commonly known as: COREG   carvedilol 6.25 MG tablet Commonly known as: COREG   gabapentin 100 MG capsule Commonly known as: NEURONTIN   olopatadine 0.1 % ophthalmic solution Commonly known as: PATANOL       TAKE these medications    acetaminophen 500 MG tablet Commonly known as: TYLENOL Take 500 mg by mouth every 6 (six) hours as needed for moderate pain or headache.   aspirin EC 81 MG tablet Take 81 mg by mouth daily.   blood glucose meter kit and supplies Kit Dispense based on patient and insurance preference. Use up to four times daily as directed. (FOR ICD-9 250.00, 250.01).   brimonidine 0.2 % ophthalmic solution Commonly known as: ALPHAGAN Place 1 drop into the right eye 3 (three) times daily.   colchicine 0.6 MG tablet Take 1 tablet (0.6 mg total) by mouth daily as needed. What changed: reasons to take this   hydrOXYzine 25 MG tablet Commonly known as: ATARAX Take 25-50 mg by mouth every 6 (six) hours as needed for itching (or sleep).   losartan 25 MG tablet Commonly known as: COZAAR Take 25 mg by mouth daily.   mycophenolate 360 MG Tbec EC tablet Commonly known as: MYFORTIC Take 720 mg by mouth 2 (two) times daily.   Narcan 4 MG/0.1ML Liqd nasal spray kit Generic drug: naloxone Place 1 spray into the nose as needed (overdose).   pantoprazole 40 MG tablet Commonly known as: PROTONIX Take 40 mg by mouth in the morning.   predniSONE 5 MG tablet Commonly  known as: DELTASONE Take 5 mg by mouth daily.   rosuvastatin 5 MG tablet Commonly known as: CRESTOR Take 5 mg by mouth daily. What changed: Another medication with the same name was removed. Continue taking this medication, and follow the directions you see here.   sodium bicarbonate 650 MG tablet Take 1,300 mg by mouth 2 (two) times daily.   tacrolimus 1 MG capsule Commonly known as: PROGRAF Take 2-3 mg by mouth See admin instructions. Take 3 mg by mouth in the  morning and 2 mg at bedtime   tamsulosin 0.4 MG Caps capsule Commonly known as: FLOMAX Take 0.4 mg by mouth at bedtime.   Xarelto 2.5 MG Tabs tablet Generic drug: rivaroxaban Take 1 tablet (2.5 mg total) by mouth 2 (two) times daily. What changed:  how much to take when to take this        Discharge Instructions: Please refer to Patient Instructions section of EMR for full details.  Patient was counseled important signs and symptoms that should prompt return to medical care, changes in medications, dietary instructions, activity restrictions, and follow up appointments.   Follow-Up Appointments:  Follow-up Information     Flossie Buffy., MD. Daphane Shepherd on 01/21/2023.   Why: 11 AM (arrive 20 minutes early) Contact information: [facility] [place], [place], [place], [place]. Schedule an appointment as soon as possible for a visit.   Contact information: [place] [address] [phone]         Elkins, Wilson Oliver, MD. Go on 11/15/2022.   Specialty: Family Medicine Why: @11$ :30am Contact information: [place] [address] [phone]                 Leslie Dales, DO 11/09/2022, 11:34 AM PGY-1, [facility]  Upper Level Addendum:  I have seen and evaluated this patient along with Dr. Nelda Bucks and reviewed the above note, making necessary revisions as appropriate.  I agree with the medical decision making and physical exam as noted above.  Gerrit Heck, MD PGY-2 [facility] Residency

## 2022-11-09 NOTE — Progress Notes (Signed)
Pt being d/c, VSS, IV removed, Education Complete, Pt transferred to D/C lounge by SWOT.    Alvis Lemmings, RN 11/09/2022 11:11 AM

## 2022-11-09 NOTE — Discharge Instructions (Addendum)
Dear Theodore Arellano,  Thank you for letting us participate in your care. You were hospitalized for low heart rate and high blood pressure and diagnosed with Bradycardia. We discontinued carvedilol, and gave hydralazine to lower blood pressure.   POST-HOSPITAL & CARE INSTRUCTIONS Please do not take carvedilol Please go to appointment with cardiologist Start taking eye drop we prescribed here I recommend you see your nephrologist soon, to discuss appropriate blood pressure medications Please go see your eye doctor at [facility] Go to your follow up appointments (listed below)   DOCTOR'S APPOINTMENT   No future appointments.  Follow-up Information     Flossie Buffy., MD. Daphane Shepherd on 01/21/2023.   Why: 11 AM (arrive 20 minutes early) Contact information: Cardiology [place], [place], [place], [place]. Schedule an appointment as soon as possible for a visit.   Contact information: [place] [address] [phone]         [name], MD Follow up.   Specialty: Family Medicine Contact information: [place] [address] [phone]                 Take care and be well!  [facility]  [place], [place] [address] [phone]

## 2022-11-09 NOTE — TOC Transition Note (Signed)
Transition of Care [facility]) - CM/SW Discharge Note   Patient Details  Name: Theodore Arellano MRN: [id number] Date of Birth: 1960-12-12  Transition of Care [facility]) CM/SW Contact:  Zenon Mayo, RN Phone Number: 11/09/2022, 10:22 AM   Clinical Narrative:    Patient is for dc today, he states his cousin will transport him home. He has no needs.          Patient Goals and CMS Choice      Discharge Placement                         Discharge Plan and Services Additional resources added to the After Visit Summary for                                       Social Determinants of Health (SDOH) Interventions SDOH Screenings   Tobacco Use: High Risk (11/08/2022)     Readmission Risk Interventions     No data to display

## 2022-11-09 NOTE — Hospital Course (Addendum)
Theodore Arellano is a 62 year old male who presented from PCP office earlier this morning when noted to have heart rates in the low 40s, and subsequently developed systolic blood pressures greater than 180 while in the ED.  Possible history includes type 2 diabetes, Mobitz type II heart block, atrial fibrillation, HLD, CAD, PAD, anemia, and blindness of right eye.  His hospital course is outlined below.  Bradycardia Initially presented with bradycardia in the 40s, found to be in 2: 1 second-degree AV block.  Resolved upon cardiology evaluation in the ED, and then recurred in the 54s.  Patient was taking carvedilol, which was stopped during this admission.  After discontinuation of carvedilol, heart rate fluctuated between 40-70.  Cardiology recommended outpatient follow-up.  HTN (hypertension) Patient has chronic HTN. In ED patient developed systolic blood pressures in the 200s.  He remained asymptomatic and neurologically intact during hospitalization.  Received hydralazine, which controlled blood pressures while inpatient.  Per patient, he discontinued amlodipine outpatient and is only taking losartan.  Systolics remained between 150-160 without further hydralazine or other blood pressure medication.  He reports to getting nephrology labs this month, and will speak with nephrologist soon.  Patient was discharged home on his home losartan, which we anticipate will lower his blood pressures.  He was in stable and good condition at time of discharge.  Congestive heart failure Echocardiogram obtained during admission which showed EF of 30-35%, akinesis of anteroseptal wall and apex, hypokinesis of inferior wall with overall moderate severe LVEF dysfunction.  This is similar to his echocardiogram in 2020, which showed severe akinesis and ejection fraction of 35-40%.  Remained euvolemic and asymptomatic during admission. Recommend follow-up with cardiologist outpatient.  Hypomagnesemia Mg 1.6, given to mg IV  mag.  Stabilized during admission.  Right eye discomfort Legally blind in right eye, s/p aneurysm.  Cataract present.  Patient endorsed increased itching and discomfort in right eye.  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  They started brimonidine eyedrops 3 times a day until patient sees eye doctor at [facility].  Patient would like to make his own eye doctor appointment.   Other chronic medical conditions were managed medically and stable at time of discharge.  Follow-up recommendations Reassess patient's blood pressure, may need more coverage than losartan alone. Ensure patient has appropriate cardiology follow-up,next appt in April 2024, but he may need sooner follow-up. Ensure patient has adequate ophthalmology follow-up.

## 2022-11-09 NOTE — Progress Notes (Signed)
Mobility Specialist - Progress Note   11/09/22 0900  Mobility  Activity Ambulated independently in hallway  Level of Assistance Independent  Assistive Device None  Distance Ambulated (ft) 400 ft  Activity Response Tolerated well  Mobility Referral Yes  $Mobility charge 1 Mobility    Pt received in bed agreeable to mobility. No complaints throughout, no physical assistance needed. Left sitting EOB w/ MD present at bed side.   [facility] Specialist Please contact via SecureChat or Rehab office at [phone]

## 2022-11-10 ENCOUNTER — Emergency Department (HOSPITAL_COMMUNITY)
Admission: EM | Admit: 2022-11-10 | Discharge: 2022-11-11 | Disposition: A | Discharge status: Home / Self Care | Payer: 59 | Attending: Emergency Medicine | Admitting: Emergency Medicine

## 2022-11-10 ENCOUNTER — Encounter (HOSPITAL_COMMUNITY): Payer: Self-pay

## 2022-11-10 ENCOUNTER — Emergency Department (HOSPITAL_COMMUNITY): Payer: 59

## 2022-11-10 ENCOUNTER — Other Ambulatory Visit: Payer: Self-pay

## 2022-11-10 DIAGNOSIS — R9431 Abnormal electrocardiogram [ECG] [EKG]: Secondary | ICD-10-CM | POA: Diagnosis not present

## 2022-11-10 DIAGNOSIS — I517 Cardiomegaly: Secondary | ICD-10-CM | POA: Diagnosis not present

## 2022-11-10 DIAGNOSIS — E162 Hypoglycemia, unspecified: Secondary | ICD-10-CM | POA: Diagnosis not present

## 2022-11-10 DIAGNOSIS — R002 Palpitations: Secondary | ICD-10-CM | POA: Insufficient documentation

## 2022-11-10 DIAGNOSIS — R001 Bradycardia, unspecified: Secondary | ICD-10-CM | POA: Insufficient documentation

## 2022-11-10 DIAGNOSIS — I441 Atrioventricular block, second degree: Secondary | ICD-10-CM | POA: Insufficient documentation

## 2022-11-10 LAB — BASIC METABOLIC PANEL
Anion gap: 17 — ABNORMAL HIGH (ref 5–15)
BUN: 22 mg/dL (ref 8–23)
CO2: 17 mmol/L — ABNORMAL LOW (ref 22–32)
Calcium: 8.8 mg/dL — ABNORMAL LOW (ref 8.9–10.3)
Chloride: 103 mmol/L (ref 98–111)
Creatinine, Ser: 1.52 mg/dL — ABNORMAL HIGH (ref 0.61–1.24)
GFR, Estimated: 51 mL/min — ABNORMAL LOW (ref >60)
Glucose, Bld: 41 mg/dL — CL (ref 70–99)
Potassium: 3.7 mmol/L (ref 3.5–5.1)
Sodium: 137 mmol/L (ref 135–145)

## 2022-11-10 LAB — TROPONIN I (HIGH SENSITIVITY): Troponin I (High Sensitivity): 36 ng/L — ABNORMAL HIGH (ref <18)

## 2022-11-10 LAB — CBC
HCT: 41.9 % (ref 39.0–52.0)
Hemoglobin: 13.4 g/dL (ref 13.0–17.0)
MCH: 28.2 pg (ref 26.0–34.0)
MCHC: 32 g/dL (ref 30.0–36.0)
MCV: 88.2 fL (ref 80.0–100.0)
Platelets: 277 10*3/uL (ref 150–400)
RBC: 4.75 MIL/uL (ref 4.22–5.81)
RDW: 15.5 % (ref 11.5–15.5)
WBC: 10.9 10*3/uL — ABNORMAL HIGH (ref 4.0–10.5)
nRBC: 0 % (ref 0.0–0.2)

## 2022-11-10 LAB — BRAIN NATRIURETIC PEPTIDE: B Natriuretic Peptide: 194.1 pg/mL — ABNORMAL HIGH (ref 0.0–100.0)

## 2022-11-10 LAB — CBG MONITORING, ED: Glucose-Capillary: 33 mg/dL — CL (ref 70–99)

## 2022-11-10 LAB — MAGNESIUM: Magnesium: 1.6 mg/dL — ABNORMAL LOW (ref 1.7–2.4)

## 2022-11-10 MED ORDER — MAGNESIUM SULFATE 2 GM/50ML IV SOLN
2.0000 g | Freq: Once | INTRAVENOUS | Status: AC
  Administered 2022-11-11: 2 g via INTRAVENOUS
  Filled 2022-11-10: qty 50

## 2022-11-10 MED ORDER — DEXTROSE 50 % IV SOLN
1.0000 | Freq: Once | INTRAVENOUS | Status: AC
  Administered 2022-11-10: 50 mL via INTRAVENOUS
  Filled 2022-11-10: qty 50

## 2022-11-10 NOTE — ED Provider Notes (Signed)
[facility] Emergency Department Provider Note MRN:  [id number]  Arrival date & time: 11/11/22     Chief Complaint   Palpitations   History of Present Illness   Theodore Arellano is a 62 y.o. year-old male presents to the ED with chief complaint of palpitations.  Just discharged a couple of days ago after admission for bradycardia thought 2/2 to Mobitz II heart block.  EMS reportedly found patient wandering today after having been drinking vodka and was complaining of palpitations.  He denies having any pain.  No interventions by EMS.  History provided by patient.   Review of Systems  Pertinent positive and negative review of systems noted in HPI.    Physical Exam   Vitals:   11/11/22 0300 11/11/22 0430  BP: (!) 190/116 (!) 149/82  Pulse: 68 61  Resp: 14 14  Temp:  97.8 F (36.6 C)  SpO2: 100% 100%    CONSTITUTIONAL:  non-toxic-appearing, NAD NEURO:  Alert and oriented x 3, CN 3-12 grossly intact EYES:  R eye blind ENT/NECK:  Supple, no stridor  CARDIO:  bradycardic, regular rhythm, appears well-perfused  PULM:  No respiratory distress, CTAB GI/GU:  non-distended,  MSK/SPINE:  No gross deformities, no edema, moves all extremities  SKIN:  no rash, atraumatic   *Additional and/or pertinent findings included in MDM below  Diagnostic and Interventional Summary    EKG Interpretation  Date/Time:  Saturday November 10 2022 22:11:42 EST Ventricular Rate:  60 PR Interval:    QRS Duration: 106 QT Interval:  546 QTC Calculation: 546 R Axis:   32 Text Interpretation: Sinus rhythm Second deg AVB, Mobitz I (Wenckebach) Left atrial enlargement Anterolateral infarct, age indeterminate Prolonged QT interval When compared with ECG of 11/07/2022, Mobitz I 2-degree AV block (Wenckebach block) has replaced 2:1 AV block Confirmed by Delora Fuel ([id number]) on 11/11/2022 1:24:50 AM       Labs Reviewed  BASIC METABOLIC PANEL - Abnormal; Notable for the following  components:      Result Value   CO2 17 (*)    Glucose, Bld 41 (*)    Creatinine, Ser 1.52 (*)    Calcium 8.8 (*)    GFR, Estimated 51 (*)    Anion gap 17 (*)    All other components within normal limits  CBC - Abnormal; Notable for the following components:   WBC 10.9 (*)    All other components within normal limits  MAGNESIUM - Abnormal; Notable for the following components:   Magnesium 1.6 (*)    All other components within normal limits  BRAIN NATRIURETIC PEPTIDE - Abnormal; Notable for the following components:   B Natriuretic Peptide 194.1 (*)    All other components within normal limits  CBG MONITORING, ED - Abnormal; Notable for the following components:   Glucose-Capillary 33 (*)    All other components within normal limits  CBG MONITORING, ED - Abnormal; Notable for the following components:   Glucose-Capillary 158 (*)    All other components within normal limits  CBG MONITORING, ED - Abnormal; Notable for the following components:   Glucose-Capillary 115 (*)    All other components within normal limits  TROPONIN I (HIGH SENSITIVITY) - Abnormal; Notable for the following components:   Troponin I (High Sensitivity) 36 (*)    All other components within normal limits  TROPONIN I (HIGH SENSITIVITY) - Abnormal; Notable for the following components:   Troponin I (High Sensitivity) 32 (*)    All other components  within normal limits  CBG MONITORING, ED    DG Chest 2 View  Final Result      Medications  dextrose 50 % solution 50 mL (50 mLs Intravenous Given 11/10/22 2353)  magnesium sulfate IVPB 2 g 50 mL (0 g Intravenous Stopped 11/11/22 0114)     Procedures  /  Critical Care Procedures  ED Course and Medical Decision Making  I have reviewed the triage vital signs, the nursing notes, and pertinent available records from the EMR.  Social Determinants Affecting Complexity of Care: Patient has no clinically significant social determinants affecting this chief  complaint..   ED Course:    Medical Decision Making Patient here with palpitations.  After consulting with cardiology, felt that palpitations are 2/2 skipped beat.  Has refused cardiac monitors during recent admission.  Recommend outpatient cardiology follow-up.  No symptoms now.   Glucose was low in triage, but has normalized and stabilized.      Amount and/or Complexity of Data Reviewed Labs: ordered. Radiology: ordered.  Risk Prescription drug management.     Consultants: I discussed the case with Dr. Radford Pax, from cardiology, who recommends that patient follow-up with his cardiologist.  No sustained 2:1 rhythm, bradys down to mid 27s while asleep, but remains in the 60s when awake without symtpoms.  Can go home.   Treatment and Plan: I considered admission due to patient's initial presentation, but after considering the examination and diagnostic results, patient will not require admission and can be discharged with outpatient follow-up.  Patient seen by and discussed with attending physician, Dr. Roxanne Mins, who agrees with plan for outpatient cardiology follow-up.  Final Clinical Impressions(s) / ED Diagnoses     ICD-10-CM   1. Palpitations  R00.2     2. Bradycardia  R00.1     3. Hypoglycemia  E16.2       ED Discharge Orders     None         Discharge Instructions Discussed with and Provided to Patient:     Discharge Instructions      You heart rate is again noted to be slow today.  The palpitations that you felt are likely from a skipped beat.  You need to follow-up with your heart doctor.  Return if symptoms change or worsen.  Make certain to eat a well-balanced diet.  You should avoid alcohol and smoking.       Kamir, Schuneman, PA-C [id number]    Delora Fuel, MD [id number] [phone]

## 2022-11-10 NOTE — ED Notes (Signed)
Provided patient food and drink for low blood sugar

## 2022-11-10 NOTE — ED Triage Notes (Signed)
Patient arrives via EMS for heart palpitations. Patient was found walking around after drinking a bottle of vodka and was having heart palpitations. EMS called, heart rate initially in the 60s but when EMS placed patient on EKG leads, it appeared that patient was in first degree heart block. HR upon arrival was in the 30s.

## 2022-11-11 DIAGNOSIS — R002 Palpitations: Secondary | ICD-10-CM | POA: Diagnosis not present

## 2022-11-11 LAB — CBG MONITORING, ED
Glucose-Capillary: 115 mg/dL — ABNORMAL HIGH (ref 70–99)
Glucose-Capillary: 158 mg/dL — ABNORMAL HIGH (ref 70–99)

## 2022-11-11 LAB — TROPONIN I (HIGH SENSITIVITY): Troponin I (High Sensitivity): 32 ng/L — ABNORMAL HIGH (ref <18)

## 2022-11-11 NOTE — ED Notes (Signed)
Discharge instructions reviewed with previous RN. Cab voucher given to patient. Patient ambulatory out to lobby to wait for cab.

## 2022-11-11 NOTE — Discharge Instructions (Signed)
You heart rate is again noted to be slow today.  The palpitations that you felt are likely from a skipped beat.  You need to follow-up with your heart doctor.  Return if symptoms change or worsen.  Make certain to eat a well-balanced diet.  You should avoid alcohol and smoking.

## 2022-12-28 ENCOUNTER — Emergency Department (HOSPITAL_COMMUNITY): Payer: 59

## 2022-12-28 ENCOUNTER — Encounter (HOSPITAL_COMMUNITY): Payer: Self-pay

## 2022-12-28 ENCOUNTER — Emergency Department (HOSPITAL_COMMUNITY)
Admission: EM | Admit: 2022-12-28 | Discharge: 2022-12-28 | Disposition: A | Discharge status: Home / Self Care | Payer: 59 | Attending: Emergency Medicine | Admitting: Emergency Medicine

## 2022-12-28 ENCOUNTER — Other Ambulatory Visit: Payer: Self-pay

## 2022-12-28 DIAGNOSIS — I509 Heart failure, unspecified: Secondary | ICD-10-CM | POA: Insufficient documentation

## 2022-12-28 DIAGNOSIS — Z86718 Personal history of other venous thrombosis and embolism: Secondary | ICD-10-CM | POA: Insufficient documentation

## 2022-12-28 DIAGNOSIS — E119 Type 2 diabetes mellitus without complications: Secondary | ICD-10-CM | POA: Insufficient documentation

## 2022-12-28 DIAGNOSIS — E875 Hyperkalemia: Secondary | ICD-10-CM | POA: Diagnosis not present

## 2022-12-28 DIAGNOSIS — Z1152 Encounter for screening for COVID-19: Secondary | ICD-10-CM | POA: Diagnosis not present

## 2022-12-28 DIAGNOSIS — Z79899 Other long term (current) drug therapy: Secondary | ICD-10-CM | POA: Insufficient documentation

## 2022-12-28 DIAGNOSIS — D849 Immunodeficiency, unspecified: Secondary | ICD-10-CM | POA: Diagnosis not present

## 2022-12-28 DIAGNOSIS — R059 Cough, unspecified: Secondary | ICD-10-CM | POA: Diagnosis present

## 2022-12-28 DIAGNOSIS — I2510 Atherosclerotic heart disease of native coronary artery without angina pectoris: Secondary | ICD-10-CM | POA: Insufficient documentation

## 2022-12-28 DIAGNOSIS — R0609 Other forms of dyspnea: Secondary | ICD-10-CM | POA: Insufficient documentation

## 2022-12-28 LAB — CBC
HCT: 44 % (ref 39.0–52.0)
Hemoglobin: 14.1 g/dL (ref 13.0–17.0)
MCH: 28 pg (ref 26.0–34.0)
MCHC: 32 g/dL (ref 30.0–36.0)
MCV: 87.3 fL (ref 80.0–100.0)
Platelets: 142 10*3/uL — ABNORMAL LOW (ref 150–400)
RBC: 5.04 MIL/uL (ref 4.22–5.81)
RDW: 14.3 % (ref 11.5–15.5)
WBC: 6.4 10*3/uL (ref 4.0–10.5)
nRBC: 0 % (ref 0.0–0.2)

## 2022-12-28 LAB — COMPREHENSIVE METABOLIC PANEL
ALT: 11 U/L (ref 0–44)
AST: 18 U/L (ref 15–41)
Albumin: 2.5 g/dL — ABNORMAL LOW (ref 3.5–5.0)
Alkaline Phosphatase: 58 U/L (ref 38–126)
Anion gap: 8 (ref 5–15)
BUN: 18 mg/dL (ref 8–23)
CO2: 22 mmol/L (ref 22–32)
Calcium: 8.6 mg/dL — ABNORMAL LOW (ref 8.9–10.3)
Chloride: 109 mmol/L (ref 98–111)
Creatinine, Ser: 1.58 mg/dL — ABNORMAL HIGH (ref 0.61–1.24)
GFR, Estimated: 49 mL/min — ABNORMAL LOW (ref >60)
Glucose, Bld: 80 mg/dL (ref 70–99)
Potassium: 5.3 mmol/L — ABNORMAL HIGH (ref 3.5–5.1)
Sodium: 139 mmol/L (ref 135–145)
Total Bilirubin: 0.8 mg/dL (ref 0.3–1.2)
Total Protein: 6.3 g/dL — ABNORMAL LOW (ref 6.5–8.1)

## 2022-12-28 LAB — RESP PANEL BY RT-PCR (RSV, FLU A&B, COVID)  RVPGX2
Influenza A by PCR: NEGATIVE
Influenza B by PCR: NEGATIVE
Resp Syncytial Virus by PCR: NEGATIVE
SARS Coronavirus 2 by RT PCR: NEGATIVE

## 2022-12-28 LAB — CBG MONITORING, ED: Glucose-Capillary: 75 mg/dL (ref 70–99)

## 2022-12-28 LAB — POTASSIUM: Potassium: 4.1 mmol/L (ref 3.5–5.1)

## 2022-12-28 MED ORDER — LACTATED RINGERS IV BOLUS: 500.0000 mL | Freq: Once | INTRAVENOUS | Status: DC

## 2022-12-28 MED ORDER — FUROSEMIDE 10 MG/ML IJ SOLN
40.0000 mg | Freq: Once | INTRAMUSCULAR | Status: AC
  Administered 2022-12-28: 40 mg via INTRAVENOUS
  Filled 2022-12-28: qty 4

## 2022-12-28 NOTE — ED Triage Notes (Signed)
Pt BIB [facility] from [facility] d/t non-productive cough that has been going on for over 2 weeks. Pt was also hypertensive w EMS in the 180s. Pt is on bp meds which he stated he did not take any today . A&O X4.

## 2022-12-28 NOTE — ED Notes (Signed)
Dr. Jeanell Sparrow made aware of pt's brady.

## 2022-12-28 NOTE — ED Provider Notes (Signed)
[facility] Provider Note   CSN: [id number] Arrival date & time: 12/28/22  0740     History  Chief Complaint  Patient presents with   Cough    Theodore Arellano is a 62 y.o. male.  HPI 62 yo male ho renal transplant, t2dm, dvt, chf, cad, presents today complaining of cough for 3 days to 2 weeks.  Some chills, no fever, mild intermittent dyspnea.  Reports taking po well.Patient is on immunosuppresants.  Reports normal urination.       Home Medications Prior to Admission medications   Medication Sig Start Date End Date Taking? Authorizing Provider  acetaminophen (TYLENOL) 500 MG tablet Take 500 mg by mouth every 6 (six) hours as needed for moderate pain or headache.    [provider]  aspirin EC 81 MG tablet Take 81 mg by mouth daily.    [provider]  blood glucose meter kit and supplies KIT Dispense based on patient and insurance preference. Use up to four times daily as directed. (FOR ICD-9 250.00, 250.01). 11/11/19   Nita Sells, MD  brimonidine (ALPHAGAN) 0.2 % ophthalmic solution Place 1 drop into the right eye 3 (three) times daily. 11/09/22   Gerrit Heck, MD  colchicine 0.6 MG tablet Take 1 tablet (0.6 mg total) by mouth daily as needed. Patient taking differently: Take 0.6 mg by mouth daily as needed (for gout flares). 01/06/21   Martyn Malay, MD  hydrOXYzine (ATARAX) 25 MG tablet Take 25-50 mg by mouth every 6 (six) hours as needed for itching (or sleep).    [provider]  losartan (COZAAR) 25 MG tablet Take 25 mg by mouth daily.    [provider]  mycophenolate (MYFORTIC) 360 MG TBEC EC tablet Take 720 mg by mouth 2 (two) times daily.    [provider]  NARCAN 4 MG/0.1ML LIQD nasal spray kit Place 1 spray into the nose as needed (overdose). 12/02/19   [provider]  pantoprazole (PROTONIX) 40 MG tablet Take 40 mg by mouth in the morning. 11/02/19   [provider]  predniSONE (DELTASONE) 5 MG tablet Take 5 mg by mouth daily. 12/07/19   [provider]  rivaroxaban (XARELTO) 2.5 MG TABS tablet Take 1 tablet (2.5 mg total) by mouth 2 (two) times daily. 11/09/22   Gerrit Heck, MD  rosuvastatin (CRESTOR) 5 MG tablet Take 5 mg by mouth daily.    [provider]  sodium bicarbonate 650 MG tablet Take 1,300 mg by mouth 2 (two) times daily.    [provider]  tacrolimus (PROGRAF) 1 MG capsule Take 2-3 mg by mouth See admin instructions. Take 3 mg by mouth in the morning and 2 mg at bedtime    [provider]  tamsulosin (FLOMAX) 0.4 MG CAPS capsule Take 0.4 mg by mouth at bedtime. 08/28/19   [provider]      Allergies    Percocet [oxycodone-acetaminophen], Vicodin [hydrocodone-acetaminophen], Shellfish allergy, Chocolate flavor, Chocolate, and Tomato    Review of Systems   Review of Systems  Physical Exam Updated Vital Signs BP (!) 148/74   Pulse (!) 37   Temp 97.8 F (36.6 C) (Oral)   Resp (!) 23   SpO2 100%  Physical Exam Vitals reviewed.  Constitutional:      Appearance: He is normal weight.  HENT:     Head: Normocephalic.     Right Ear: External ear normal.  Left Ear: External ear normal.     Nose: Rhinorrhea present.     Mouth/Throat:     Mouth: Mucous membranes are moist.     Pharynx: Oropharynx is clear.  Eyes:     Comments: Right eye with decreased vision  Cardiovascular:     Rate and Rhythm: Normal rate.     Pulses: Normal pulses.  Pulmonary:     Effort: Pulmonary effort is normal.  Abdominal:     General: Abdomen is flat.     Palpations: Abdomen is soft.  Musculoskeletal:        General: No swelling or tenderness.     Cervical back: Normal range of motion.     Right lower leg: Edema present.     Left lower leg: Edema present.  Skin:    General: Skin is warm.     Capillary Refill: Capillary refill takes less than 2 seconds.  Neurological:     General: No  focal deficit present.     Mental Status: He is alert.     ED Results / Procedures / Treatments   Labs (all labs ordered are listed, but only abnormal results are displayed) Labs Reviewed  CBC - Abnormal; Notable for the following components:      Result Value   Platelets 142 (*)    All other components within normal limits  COMPREHENSIVE METABOLIC PANEL - Abnormal; Notable for the following components:   Potassium 5.3 (*)    Creatinine, Ser 1.58 (*)    Calcium 8.6 (*)    Total Protein 6.3 (*)    Albumin 2.5 (*)    GFR, Estimated 49 (*)    All other components within normal limits  RESP PANEL BY RT-PCR (RSV, FLU A&B, COVID)  RVPGX2  POTASSIUM  CBG MONITORING, ED    EKG EKG Interpretation  Date/Time:  Friday December 28 2022 07:48:24 EDT Ventricular Rate:  56 PR Interval:    QRS Duration: 103 QT Interval:  441 QTC Calculation: 360 R Axis:   178 Text Interpretation: Right and left arm electrode reversal, interpretation assumes no reversal Sinus or ectopic atrial rhythm Second deg AVB, Mobitz I (Wenckebach) Consider left ventricular hypertrophy Inferior infarct, old Anterolateral infarct, age indeterminate Confirmed by Pattricia Boss [id number]) on 12/28/2022 8:56:28 AM  Radiology DG Chest Port 1 View  Result Date: 12/28/2022 CLINICAL DATA:  Cough for two weeks EXAM: PORTABLE CHEST 1 VIEW COMPARISON:  11/10/2022 FINDINGS: Cardiomegaly status post median sternotomy with left atrial appendage clip. Both lungs are clear. The visualized skeletal structures are unremarkable. IMPRESSION: 1. No acute abnormality of the lungs in AP portable projection. 2. Cardiomegaly. Electronically Signed   By: Delanna Ahmadi M.D.   On: 12/28/2022 08:28    Procedures Procedures    Medications Ordered in ED Medications  furosemide (LASIX) injection 40 mg (40 mg Intravenous Given 12/28/22 1253)    ED Course/ Medical Decision Making/ A&P Clinical Course as of 12/28/22 1548  Fri Dec 28, 2022  1056 Chest  x-Jaece Ducharme reviewed and interpreted significant for status post sternotomy with wires in place, cardiomegaly although in AP view, some possible increased interstitial markings.  Radiologist interpretation notes no acute abnormality and cardiomegaly [DR]  1230 CBC reviewed interpreted within normal limits COVID, flu, RSV [DR]  1231  reviewed interpreted and negative [DR]  [id number] Complete metabolic panel is reviewed and interpreted significant for hyperkalemia with potassium of 5.3 Creatinine is elevated at 1.58 which is stable from prior  [DR]    Clinical  Course User Index [DR] Pattricia Boss, MD                             Medical Decision Making Amount and/or Complexity of Data Reviewed Labs: ordered. Radiology: ordered.  Risk Prescription drug management.   62 year old male with complex past medical history presents today with cough.  Patient is immunosuppressed.  He is evaluated here with labs and imaging. Differential diagnosis includes but is not limited to acute congestive heart failure, acute pulmonary infection, PE, cardiac etiologies Patient has remained hemodynamically stable here in the ED.  He has had some episodes of heart rate down into the 30s but has remained asymptomatic with this. On my reevaluation heart rate is in the 50s. Patient's chest x-Merril Isakson is significant for some mild cardiomegaly with no evidence of acute infiltrates Patient treated here in the ED with Lasix and feels improved. Awaiting repeat potassium on repeat draw. Plan is for discharge to home. Discussed care with Dr. Laverta Baltimore to dispo after potassium        Final Clinical Impression(s) / ED Diagnoses Final diagnoses:  Cough, unspecified type  Hyperkalemia    Rx / DC Orders ED Discharge Orders     None         Pattricia Boss, MD 12/28/22 [phone]

## 2022-12-28 NOTE — Discharge Instructions (Signed)

## 2022-12-28 NOTE — ED Provider Notes (Signed)
Blood pressure (!) 155/91, pulse (!) 42, temperature 99.6 F (37.6 C), temperature source Oral, resp. rate 20, SpO2 99 %.  Assuming care from Dr. Jeanell Sparrow.  In short, Theodore Arellano is a 61 y.o. male with a chief complaint of Cough .  Refer to the original H&P for additional details.  The current plan of care is to follow up on repeat K.  05:00 PM  Repeat K now WNL. Patient feeling well. Stable for d/c with close PCP follow up.     Margette Fast, MD 12/28/22 [phone]

## 2023-07-15 ENCOUNTER — Encounter (HOSPITAL_COMMUNITY): Payer: Self-pay

## 2023-07-15 ENCOUNTER — Inpatient Hospital Stay (HOSPITAL_COMMUNITY)
Admission: EM | Admit: 2023-07-15 | Discharge: 2023-07-23 | DRG: 253 | Disposition: A | Discharge status: Skilled Nursing Facility | Payer: 59 | Attending: Internal Medicine | Admitting: Internal Medicine

## 2023-07-15 ENCOUNTER — Inpatient Hospital Stay (HOSPITAL_COMMUNITY): Payer: 59

## 2023-07-15 DIAGNOSIS — K219 Gastro-esophageal reflux disease without esophagitis: Secondary | ICD-10-CM | POA: Diagnosis present

## 2023-07-15 DIAGNOSIS — E782 Mixed hyperlipidemia: Secondary | ICD-10-CM

## 2023-07-15 DIAGNOSIS — E785 Hyperlipidemia, unspecified: Secondary | ICD-10-CM | POA: Diagnosis present

## 2023-07-15 DIAGNOSIS — E1122 Type 2 diabetes mellitus with diabetic chronic kidney disease: Secondary | ICD-10-CM | POA: Diagnosis present

## 2023-07-15 DIAGNOSIS — Z86718 Personal history of other venous thrombosis and embolism: Secondary | ICD-10-CM

## 2023-07-15 DIAGNOSIS — F1410 Cocaine abuse, uncomplicated: Secondary | ICD-10-CM | POA: Diagnosis present

## 2023-07-15 DIAGNOSIS — H548 Legal blindness, as defined in USA: Secondary | ICD-10-CM | POA: Diagnosis present

## 2023-07-15 DIAGNOSIS — I743 Embolism and thrombosis of arteries of the lower extremities: Secondary | ICD-10-CM | POA: Diagnosis present

## 2023-07-15 DIAGNOSIS — Z86711 Personal history of pulmonary embolism: Secondary | ICD-10-CM

## 2023-07-15 DIAGNOSIS — G8929 Other chronic pain: Secondary | ICD-10-CM | POA: Diagnosis present

## 2023-07-15 DIAGNOSIS — R001 Bradycardia, unspecified: Secondary | ICD-10-CM | POA: Diagnosis present

## 2023-07-15 DIAGNOSIS — I130 Hypertensive heart and chronic kidney disease with heart failure and stage 1 through stage 4 chronic kidney disease, or unspecified chronic kidney disease: Secondary | ICD-10-CM | POA: Diagnosis present

## 2023-07-15 DIAGNOSIS — Z951 Presence of aortocoronary bypass graft: Secondary | ICD-10-CM

## 2023-07-15 DIAGNOSIS — I998 Other disorder of circulatory system: Secondary | ICD-10-CM | POA: Diagnosis not present

## 2023-07-15 DIAGNOSIS — Z940 Kidney transplant status: Secondary | ICD-10-CM

## 2023-07-15 DIAGNOSIS — I70301 Unspecified atherosclerosis of unspecified type of bypass graft(s) of the extremities, right leg: Secondary | ICD-10-CM | POA: Diagnosis not present

## 2023-07-15 DIAGNOSIS — N1832 Chronic kidney disease, stage 3b: Secondary | ICD-10-CM

## 2023-07-15 DIAGNOSIS — I70229 Atherosclerosis of native arteries of extremities with rest pain, unspecified extremity: Secondary | ICD-10-CM | POA: Diagnosis present

## 2023-07-15 DIAGNOSIS — I70221 Atherosclerosis of native arteries of extremities with rest pain, right leg: Secondary | ICD-10-CM | POA: Diagnosis present

## 2023-07-15 DIAGNOSIS — Z9102 Food additives allergy status: Secondary | ICD-10-CM

## 2023-07-15 DIAGNOSIS — I5022 Chronic systolic (congestive) heart failure: Secondary | ICD-10-CM | POA: Diagnosis present

## 2023-07-15 DIAGNOSIS — Z91018 Allergy to other foods: Secondary | ICD-10-CM

## 2023-07-15 DIAGNOSIS — Y830 Surgical operation with transplant of whole organ as the cause of abnormal reaction of the patient, or of later complication, without mention of misadventure at the time of the procedure: Secondary | ICD-10-CM | POA: Diagnosis present

## 2023-07-15 DIAGNOSIS — Z8249 Family history of ischemic heart disease and other diseases of the circulatory system: Secondary | ICD-10-CM

## 2023-07-15 DIAGNOSIS — F17210 Nicotine dependence, cigarettes, uncomplicated: Secondary | ICD-10-CM | POA: Diagnosis present

## 2023-07-15 DIAGNOSIS — H409 Unspecified glaucoma: Secondary | ICD-10-CM | POA: Diagnosis present

## 2023-07-15 DIAGNOSIS — R030 Elevated blood-pressure reading, without diagnosis of hypertension: Secondary | ICD-10-CM | POA: Diagnosis present

## 2023-07-15 DIAGNOSIS — Z560 Unemployment, unspecified: Secondary | ICD-10-CM

## 2023-07-15 DIAGNOSIS — I10 Essential (primary) hypertension: Secondary | ICD-10-CM | POA: Diagnosis not present

## 2023-07-15 DIAGNOSIS — I441 Atrioventricular block, second degree: Secondary | ICD-10-CM | POA: Diagnosis not present

## 2023-07-15 DIAGNOSIS — M109 Gout, unspecified: Secondary | ICD-10-CM | POA: Diagnosis present

## 2023-07-15 DIAGNOSIS — T82898A Other specified complication of vascular prosthetic devices, implants and grafts, initial encounter: Principal | ICD-10-CM | POA: Diagnosis present

## 2023-07-15 DIAGNOSIS — M545 Low back pain, unspecified: Secondary | ICD-10-CM | POA: Diagnosis present

## 2023-07-15 DIAGNOSIS — D62 Acute posthemorrhagic anemia: Secondary | ICD-10-CM | POA: Diagnosis not present

## 2023-07-15 DIAGNOSIS — Z885 Allergy status to narcotic agent status: Secondary | ICD-10-CM

## 2023-07-15 DIAGNOSIS — Z7901 Long term (current) use of anticoagulants: Secondary | ICD-10-CM

## 2023-07-15 DIAGNOSIS — I2510 Atherosclerotic heart disease of native coronary artery without angina pectoris: Secondary | ICD-10-CM | POA: Diagnosis present

## 2023-07-15 DIAGNOSIS — T8619 Other complication of kidney transplant: Secondary | ICD-10-CM | POA: Diagnosis present

## 2023-07-15 DIAGNOSIS — Z7964 Long term (current) use of myelosuppressive agent: Secondary | ICD-10-CM

## 2023-07-15 DIAGNOSIS — I5042 Chronic combined systolic (congestive) and diastolic (congestive) heart failure: Secondary | ICD-10-CM | POA: Diagnosis present

## 2023-07-15 DIAGNOSIS — M79671 Pain in right foot: Secondary | ICD-10-CM

## 2023-07-15 DIAGNOSIS — Y832 Surgical operation with anastomosis, bypass or graft as the cause of abnormal reaction of the patient, or of later complication, without mention of misadventure at the time of the procedure: Secondary | ICD-10-CM | POA: Diagnosis present

## 2023-07-15 DIAGNOSIS — Z79899 Other long term (current) drug therapy: Secondary | ICD-10-CM

## 2023-07-15 DIAGNOSIS — E1151 Type 2 diabetes mellitus with diabetic peripheral angiopathy without gangrene: Secondary | ICD-10-CM | POA: Diagnosis present

## 2023-07-15 DIAGNOSIS — T82868A Thrombosis of vascular prosthetic devices, implants and grafts, initial encounter: Secondary | ICD-10-CM | POA: Diagnosis present

## 2023-07-15 DIAGNOSIS — Z5982 Transportation insecurity: Secondary | ICD-10-CM

## 2023-07-15 DIAGNOSIS — Z7982 Long term (current) use of aspirin: Secondary | ICD-10-CM

## 2023-07-15 DIAGNOSIS — E8722 Chronic metabolic acidosis: Secondary | ICD-10-CM | POA: Diagnosis present

## 2023-07-15 DIAGNOSIS — N179 Acute kidney failure, unspecified: Secondary | ICD-10-CM | POA: Diagnosis present

## 2023-07-15 DIAGNOSIS — Z720 Tobacco use: Secondary | ICD-10-CM | POA: Diagnosis not present

## 2023-07-15 DIAGNOSIS — M79604 Pain in right leg: Secondary | ICD-10-CM | POA: Diagnosis not present

## 2023-07-15 DIAGNOSIS — Z79621 Long term (current) use of calcineurin inhibitor: Secondary | ICD-10-CM

## 2023-07-15 DIAGNOSIS — N186 End stage renal disease: Secondary | ICD-10-CM | POA: Diagnosis not present

## 2023-07-15 DIAGNOSIS — Z833 Family history of diabetes mellitus: Secondary | ICD-10-CM

## 2023-07-15 DIAGNOSIS — I2583 Coronary atherosclerosis due to lipid rich plaque: Secondary | ICD-10-CM

## 2023-07-15 DIAGNOSIS — E861 Hypovolemia: Secondary | ICD-10-CM | POA: Diagnosis present

## 2023-07-15 DIAGNOSIS — N189 Chronic kidney disease, unspecified: Secondary | ICD-10-CM | POA: Diagnosis present

## 2023-07-15 DIAGNOSIS — Z91013 Allergy to seafood: Secondary | ICD-10-CM

## 2023-07-15 DIAGNOSIS — I739 Peripheral vascular disease, unspecified: Secondary | ICD-10-CM

## 2023-07-15 LAB — BASIC METABOLIC PANEL
Anion gap: 10 (ref 5–15)
BUN: 34 mg/dL — ABNORMAL HIGH (ref 8–23)
CO2: 20 mmol/L — ABNORMAL LOW (ref 22–32)
Calcium: 9.1 mg/dL (ref 8.9–10.3)
Chloride: 110 mmol/L (ref 98–111)
Creatinine, Ser: 1.93 mg/dL — ABNORMAL HIGH (ref 0.61–1.24)
GFR, Estimated: 38 mL/min — ABNORMAL LOW (ref >60)
Glucose, Bld: 89 mg/dL (ref 70–99)
Potassium: 5.2 mmol/L — ABNORMAL HIGH (ref 3.5–5.1)
Sodium: 140 mmol/L (ref 135–145)

## 2023-07-15 LAB — CBC
HCT: 41.9 % (ref 39.0–52.0)
Hemoglobin: 13.2 g/dL (ref 13.0–17.0)
MCH: 27.2 pg (ref 26.0–34.0)
MCHC: 31.5 g/dL (ref 30.0–36.0)
MCV: 86.2 fL (ref 80.0–100.0)
Platelets: 279 10*3/uL (ref 150–400)
RBC: 4.86 MIL/uL (ref 4.22–5.81)
RDW: 16.3 % — ABNORMAL HIGH (ref 11.5–15.5)
WBC: 7.1 10*3/uL (ref 4.0–10.5)
nRBC: 0 % (ref 0.0–0.2)

## 2023-07-15 MED ORDER — ONDANSETRON HCL 4 MG/2ML IJ SOLN
4.0000 mg | Freq: Once | INTRAMUSCULAR | Status: AC
  Administered 2023-07-15: 4 mg via INTRAVENOUS
  Filled 2023-07-15: qty 2

## 2023-07-15 MED ORDER — LACTATED RINGERS IV BOLUS
1000.0000 mL | Freq: Once | INTRAVENOUS | Status: AC
  Administered 2023-07-15: 1000 mL via INTRAVENOUS

## 2023-07-15 MED ORDER — INSULIN ASPART 100 UNIT/ML IJ SOLN
0.0000 [IU] | INTRAMUSCULAR | Status: DC
  Administered 2023-07-16 – 2023-07-20 (×4): 1 [IU] via SUBCUTANEOUS

## 2023-07-15 MED ORDER — MORPHINE SULFATE (PF) 4 MG/ML IV SOLN
4.0000 mg | Freq: Once | INTRAVENOUS | Status: AC
  Administered 2023-07-15: 4 mg via INTRAVENOUS
  Filled 2023-07-15: qty 1

## 2023-07-15 NOTE — Assessment & Plan Note (Signed)
Chronic stable currently appears to be euvolemic chronic stable currently appears to be euvolemic hold Lasix given slight AKI

## 2023-07-15 NOTE — ED Notes (Signed)
Pt is still refusing EKG stating, "He had one last week"

## 2023-07-15 NOTE — ED Notes (Signed)
Pt refusing EKG at this time. Pt states "I came here for a leg thing".

## 2023-07-15 NOTE — Subjective & Objective (Signed)
Right leg pain for the past 3 wks, felt to be due to sciatic nerve pain  No improvement of pain  No trauma no fever no chills no swelling or redness  No neurological complaints Noted that right foot is a bit cooler No pulse was palpated in ER Discussed with Vascular surgery who rec admit to medicine start on heparin will see in AM

## 2023-07-15 NOTE — Assessment & Plan Note (Signed)
Acute on chronic CKD in the setting of history of renal transplant Obtain urine electrolytes Gently rehydrate May benefit from nephrology consult in a.m.

## 2023-07-15 NOTE — H&P (Signed)
Theodore Arellano:[id number] DOB: 08/06/1961 DOA: 07/15/2023     PCP: [name], MD   Outpatient Specialists:   CARDS:   Dr. Garwin Brothers, MD  NEphrology: *  Dr. No care team member to display  NEurology *   Dr. Pulmonary *  Dr.  Oncology * Dr.No care team member to display  GI* Dr.  Deboraha Sprang, LB) No care team member to display Urology Dr. *  Patient arrived to ER on 07/15/23 at 1901 Referred by Attending Dione Booze, MD   Patient coming from:    home Lives alone,   *** With family     Chief Complaint:   Chief Complaint  Patient presents with   Leg Pain    HPI: Theodore Arellano is a 62 y.o. male with medical history significant of  HLD, HTN, CKD stage 3b h/o renal transplant, PAD, systolic CHF EF 30-35%, Legally blind in right eye, CAD status post CABG, gout, history of DVT on xarelto, Dm2, history of cocaine abuse History of critical lower limb ischemia in 2022, Mobitz 1 heart block GERD glaucoma  Presented with  right leg pain Right leg pain for the past 3 wks, felt to be due to sciatic nerve pain  No improvement of pain  No trauma no fever no chills no swelling or redness  No neurological complaints Noted that right foot is a bit cooler No pulse was palpated in ER Discussed with Vascular surgery who rec admit to medicine start on heparin will see in AM    Denies significant ETOH intake *** Does not smoke*** but interested in quitting***  Lab Results  Component Value Date   SARSCOV2NAA NEGATIVE 12/28/2022   SARSCOV2NAA NEGATIVE 01/06/2021   SARSCOV2NAA NEGATIVE 12/27/2020   SARSCOV2NAA NEGATIVE 12/04/2020    Regarding pertinent Chronic problems:    Peripheral vascular disease status post axillary femoral bypass in April 2022  Hyperlipidemia -  on statins Crestor Lipid Panel     Component Value Date/Time   CHOL 98 01/04/2021 0640   TRIG 141 01/04/2021 0640   HDL 42 01/04/2021 0640   CHOLHDL 2.3 01/04/2021 0640   VLDL 28 01/04/2021  0640   LDLCALC 28 01/04/2021 0640     HTN on coreg in the    chronic CHF diastolic/systolic/ combined - last echo  Recent Results (from the past 81191 hour(s))  ECHOCARDIOGRAM COMPLETE   Collection Time: 11/08/22 11:42 AM  Result Value   Weight 2,713.6   Height 67   BP 154/87   S' Lateral 4.00   AR max vel 2.27   AV Area VTI 2.10   AV Mean grad 4.0   AV Peak grad 7.1   Ao pk vel 1.33   Area-P 1/2 2.99   AV Area mean vel 2.19   Est EF 30 - 35%   Narrative      ECHOCARDIOGRAM REPORT       IMPRESSIONS    1. Akinesis of the anteroseptal wall and apex; hypokinesis of the inferior wall; overall moderate to severe LV dysfunction.  2. Left ventricular ejection fraction, by estimation, is 30 to 35%. The left ventricle has moderately decreased function. The left ventricle demonstrates regional wall motion abnormalities (see scoring diagram/findings for description). The left  ventricular internal cavity size was mildly dilated. There is mild left ventricular hypertrophy. Left ventricular diastolic parameters are consistent with Grade I diastolic dysfunction (impaired relaxation). Elevated left atrial pressure.  3. Right ventricular systolic function is normal. The  right ventricular size is normal.  4. The mitral valve is normal in structure. Mild mitral valve regurgitation. No evidence of mitral stenosis.  5. The aortic valve is tricuspid. Aortic valve regurgitation is mild. Aortic valve sclerosis/calcification is present, without any evidence of aortic stenosis.  6. Aortic dilatation noted. There is mild dilatation of the aortic root, measuring 40 mm.  7. The inferior vena cava is normal in size with greater than 50% respiratory variability, suggesting right atrial pressure of 3 mmHg.           CAD  - On Aspirin, statin, betablocker,                  -  followed by cardiology                - last cardiac cath  heart catheterization in 2013 showed severe calcific 3 vessel CAD which  was managed medically. 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        ***DM 2 -  Lab Results  Component Value Date   HGBA1C 6.0 (H) 11/08/2022   ****on insulin, PO meds only, diet controlled   Hx of renal transplant on prograf                       *** COPD - not **followed by pulmonology *** not  on baseline oxygen  *L,    *** OSA -on nocturnal oxygen, *CPAP, *noncompliant with CPAP    ***Hx of DVT/PE on - anticoagulation with ****Coumadin  ***Xarelto,* Eliquis,      CKD stage IIIb-   baseline Cr 1.5 CrCl cannot be calculated (Unknown ideal weight.).  Lab Results  Component Value Date   CREATININE 1.93 (H) 07/15/2023   CREATININE 1.58 (H) 12/28/2022   CREATININE 1.52 (H) 11/10/2022   Lab Results  Component Value Date   NA 140 07/15/2023   CL 110 07/15/2023   K 5.2 (H) 07/15/2023   CO2 20 (L) 07/15/2023   BUN 34 (H) 07/15/2023   CREATININE 1.93 (H) 07/15/2023   GFRNONAA 38 (L) 07/15/2023   CALCIUM 9.1 07/15/2023   PHOS 3.9 01/03/2021   ALBUMIN 2.5 (L) 12/28/2022   GLUCOSE 89 07/15/2023      BPH - on Flomax,      While in ER:    Found to have cold right foot pulseless     Lab Orders         Basic metabolic panel         CBC         Urinalysis, Routine w reflex microscopic -Urine, Clean Catch         Rapid urine drug screen (hospital performed)      CT HEAD *** NON acute   MRI brain  ***no acute CVA  CXR - ***NON acute  CTabd/pelvis - ***nonacute  CTA chest - ***nonacute, no PE, * no evidence of infiltrate  Following Medications were ordered in ER: Medications  lactated ringers bolus 1,000 mL (1,000 mLs Intravenous New Bag/Given 07/15/23 2303)  morphine (PF) 4 MG/ML injection 4 mg (4 mg Intravenous Given 07/15/23 2306)  ondansetron (ZOFRAN) injection 4 mg (4 mg Intravenous Given 07/15/23 2306)     _______________________________________________________ ER Provider Called:   Vascular Surgery Dr Karin Lieu  They Recommend admit to medicine  start heparin Will see in AM  ED Triage Vitals  Encounter Vitals Group     BP 07/15/23 1905 (!) 169/83     Systolic BP Percentile --      Diastolic BP Percentile --      Pulse Rate 07/15/23 1905 67     Resp 07/15/23 1905 18     Temp 07/15/23 1905 98 F (36.7 C)     Temp Source 07/15/23 1905 Oral     SpO2 07/15/23 1905 100 %     Weight --      Height --      Head Circumference --      Peak Flow --      Pain Score 07/15/23 1903 8     Pain Loc --      Pain Education --      Exclude from Growth Chart --   GLOV(56)@     _________________________________________ Significant initial  Findings: Abnormal Labs Reviewed  BASIC METABOLIC PANEL - Abnormal; Notable for the following components:      Result Value   Potassium 5.2 (*)    CO2 20 (*)    BUN 34 (*)    Creatinine, Ser 1.93 (*)    GFR, Estimated 38 (*)    All other components within normal limits  CBC - Abnormal; Notable for the following components:   RDW 16.3 (*)    All other components within normal limits      _________________________ Troponin ***ordered Cardiac Panel (last 3 results) No results for input(s): "CKTOTAL", "CKMB", "TROPONINIHS", "RELINDX" in the last 72 hours.   ECG: Ordered Personally reviewed and interpreted by me showing: HR : 72 Rhythm: Sinus rhythm with 1st degree A-V block Left ventricular hypertrophy with repolarization abnormality Non-specific ST-t changes QTC 455  BNP (last 3 results) Recent Labs    11/07/22 1528 11/10/22 2218  BNP 737.6* 194.1*     COVID-19 Labs  No results for input(s): "DDIMER", "FERRITIN", "LDH", "CRP" in the last 72 hours.  Lab Results  Component Value Date   SARSCOV2NAA NEGATIVE 12/28/2022   SARSCOV2NAA NEGATIVE 01/06/2021   SARSCOV2NAA NEGATIVE 12/27/2020   SARSCOV2NAA NEGATIVE 12/04/2020     The  recent clinical data is shown below. Vitals:   07/15/23 1905  BP: (!) 169/83  Pulse: 67  Resp: 18  Temp: 98 F (36.7 C)  TempSrc: Oral  SpO2: 100%    WBC     Component Value Date/Time   WBC 7.1 07/15/2023 1932   LYMPHSABS 0.9 11/07/2022 1528   MONOABS 0.4 11/07/2022 1528   EOSABS 0.3 11/07/2022 1528   BASOSABS 0.0 11/07/2022 1528    Lactic Acid, Venous    Component Value Date/Time   LATICACIDVEN 1.2 01/11/2020 2010      ________________________________________________________________  Arterial ***Venous  Blood Gas result:  pH *** pCO2 ***; pO2 ***;     %O2 Sat ***.  ABG    Component Value Date/Time   PHART 7.355 03/11/2012 1137   PCO2ART 36.3 03/11/2012 1137   PO2ART 78.0 (L) 03/11/2012 1137   HCO3 23.1 03/11/2012 1144   HCO3 23.2 03/11/2012 1144   TCO2 26 12/08/2020 1449   ACIDBASEDEF 2.0 03/11/2012 1144   ACIDBASEDEF 2.0 03/11/2012 1144   O2SAT 72.0 03/11/2012 1144   O2SAT 71.0 03/11/2012 1144    __________________________________________________________ Recent Labs  Lab 07/15/23 1932  NA 140  K 5.2*  CO2 20*  GLUCOSE 89  BUN 34*  CREATININE 1.93*  CALCIUM 9.1    Cr    Up from baseline see  below Lab Results  Component Value Date   CREATININE 1.93 (H) 07/15/2023   CREATININE 1.58 (H) 12/28/2022   CREATININE 1.52 (H) 11/10/2022    No results for input(s): "AST", "ALT", "ALKPHOS", "BILITOT", "PROT", "ALBUMIN" in the last 168 hours. Lab Results  Component Value Date   CALCIUM 9.1 07/15/2023   PHOS 3.9 01/03/2021    Plt: Lab Results  Component Value Date   PLT 279 07/15/2023      Recent Labs  Lab 07/15/23 1932  WBC 7.1  HGB 13.2  HCT 41.9  MCV 86.2  PLT 279    HG/HCT  stable,      Component Value Date/Time   HGB 13.2 07/15/2023 1932   HCT 41.9 07/15/2023 1932   MCV 86.2 07/15/2023 1932     _______________________________________________ Hospitalist was called for admission for   Vascular insufficiency of extremity  Acute  pain of right foot      The following Work up has been ordered so far:  Orders Placed This Encounter  Procedures   Basic metabolic panel   CBC   Urinalysis, Routine w reflex microscopic -Urine, Clean Catch   Rapid urine drug screen (hospital performed)   Consult to vascular surgery   Consult to vascular surgery   Consult for Unassigned Medical Admission   heparin per pharmacy consult   Consult for [facility] Admission   EKG 12-Lead   VAS [place] LOWER EXTREMITY ARTERIAL DUPLEX (7a-7p)     OTHER Significant initial  Findings:  labs showing:     DM  labs:  HbA1C: Recent Labs    11/08/22 0136  HGBA1C 6.0*       CBG (last 3)  No results for input(s): "GLUCAP" in the last 72 hours.        Cultures:    Component Value Date/Time   SDES SITE NOT SPECIFIED 12/27/2020 2021   SPECREQUEST  12/27/2020 2021    BOTTLES DRAWN AEROBIC AND ANAEROBIC Blood Culture results may not be optimal due to an inadequate volume of blood received in culture bottles   CULT  12/27/2020 2021    NO GROWTH 5 DAYS Performed at [facility], [address] [place], [place] [address]    REPTSTATUS 01/01/2021 FINAL 12/27/2020 2021     Radiological Exams on Admission: No results found. _______________________________________________________________________________________________________ Latest  Blood pressure (!) 169/83, pulse 67, temperature 98 F (36.7 C), temperature source Oral, resp. rate 18, SpO2 100%.   Vitals  labs and radiology finding personally reviewed  Review of Systems:    Pertinent positives include: ***  Constitutional:  No weight loss, night sweats, Fevers, chills, fatigue, weight loss  HEENT:  No headaches, Difficulty swallowing,Tooth/dental problems,Sore throat,  No sneezing, itching, ear ache, nasal congestion, post nasal drip,  Cardio-vascular:  No chest pain, Orthopnea, PND, anasarca, dizziness, palpitations.no Bilateral lower extremity swelling  GI:  No  heartburn, indigestion, abdominal pain, nausea, vomiting, diarrhea, change in bowel habits, loss of appetite, melena, blood in stool, hematemesis Resp:  no shortness of breath at rest. No dyspnea on exertion, No excess mucus, no productive cough, No non-productive cough, No coughing up of blood.No change in color of mucus.No wheezing. Skin:  no rash or lesions. No jaundice GU:  no dysuria, change in color of urine, no urgency or frequency. No straining to urinate.  No flank pain.  Musculoskeletal:  No joint pain or no joint swelling. No decreased range of motion. No back pain.  Psych:  No change in mood or  affect. No depression or anxiety. No memory loss.  Neuro: no localizing neurological complaints, no tingling, no weakness, no double vision, no gait abnormality, no slurred speech, no confusion  All systems reviewed and apart from HOPI all are negative _______________________________________________________________________________________________ Past Medical History:   Past Medical History:  Diagnosis Date   Anxiety    Arthritis    "lower back" (11/28/2017)   CAD (coronary artery disease)    Chronic lower back pain    H/O immunosuppressive therapy    chronic/notes 11/28/2017   History of gout    "before kidney transport" (11/28/2017)   Hyperlipidemia    Hypertension    Kidney disease    s/p kidney transplant 2011; "not on dialysis now" (11/28/2017)   Tobacco use       Past Surgical History:  Procedure Laterality Date   ABDOMINAL AORTOGRAM W/LOWER EXTREMITY Bilateral 12/29/2020   Procedure: ABDOMINAL AORTOGRAM W/LOWER EXTREMITY;  Surgeon: Cephus Shelling, MD;  Location: [facility] INVASIVE CV LAB;  Service: Cardiovascular;  Laterality: Bilateral;   ARTERIOVENOUS GRAFT PLACEMENT Right 04/2007   /notes 02/01/2011   AV FISTULA PLACEMENT Left 12/20/2005; 12/2006   Hattie Perch 02/13/2011; Hattie Perch 02/13/2011   AXILLARY-FEMORAL BYPASS GRAFT Right 01/03/2021   Procedure: RIGHT AXILLARY TO PROFUNDA  FEMORAL BYPASS;  Surgeon: Larina Earthly, MD;  Location: [facility] OR;  Service: Vascular;  Laterality: Right;   CARDIAC CATHETERIZATION     FEMORAL-POPLITEAL BYPASS GRAFT Right 01/03/2021   Procedure: RIGHT FEMORAL TO BELOW KNEE POPLITEAL ARTERY BYPASS;  Surgeon: Larina Earthly, MD;  Location: [facility] OR;  Service: Vascular;  Laterality: Right;   HD access procedures     INGUINAL HERNIA REPAIR Left    KIDNEY TRANSPLANT  2011   LEFT HEART CATH AND CORONARY ANGIOGRAPHY N/A 11/29/2017   Procedure: LEFT HEART CATH AND CORONARY ANGIOGRAPHY;  Surgeon: Marykay Lex, MD;  [facility]: [facility] INVASIVE CV LAB;  Service: Cardiovascular;  Laterality: N/A;   LEFT HEART CATH AND CORONARY ANGIOGRAPHY N/A 06/01/2019   Procedure: LEFT HEART CATH AND CORONARY ANGIOGRAPHY;  Surgeon: Kathleene Hazel, MD;  Location: [facility] INVASIVE CV LAB;  Service: Cardiovascular;  Laterality: N/A;   LEFT HEART CATHETERIZATION WITH CORONARY ANGIOGRAM  03/11/2012   Procedure: LEFT HEART CATHETERIZATION WITH CORONARY ANGIOGRAM;  Surgeon: Dolores Patty, MD;  Location: [facility] CATH LAB;  Service: Cardiovascular;;   RIGHT HEART CATHETERIZATION N/A 03/11/2012   Procedure: RIGHT HEART CATH;  Surgeon: Dolores Patty, MD;  Location: [facility] CATH LAB;  Service: Cardiovascular;  Laterality: N/A;   THROMBECTOMY Right 12/2007   Hattie Perch 02/01/2011   THROMBECTOMY / ARTERIOVENOUS GRAFT REVISION Left 12/2006   Hattie Perch 02/13/2011   THROMBECTOMY / ARTERIOVENOUS GRAFT REVISION Right 07/2007; 10/2007;01/2008;   Hattie Perch 01/31/2011; Hattie Perch 02/01/2011; Hattie Perch 01/31/2011   THROMBECTOMY AND REVISION OF ARTERIOVENTOUS (AV) GORETEX  GRAFT  03/2007 X 2   Hattie Perch 02/01/2011    Social History:  Ambulatory *** independently cane, walker  wheelchair bound, bed bound     reports that he has been smoking cigarettes. He has a 20 pack-year smoking history. He has never used smokeless tobacco. He reports current alcohol use of about 12.0 standard drinks of alcohol per week. He reports that he  does not use drugs.     Family History: *** Family History  Problem Relation Age of Onset   Hypertension Father    Diabetes Mother    ______________________________________________________________________________________________ Allergies: Allergies  Allergen Reactions   Percocet [Oxycodone-Acetaminophen] Hives and Itching   Vicodin [Hydrocodone-Acetaminophen] Hives and Itching  Shellfish Allergy Hives   Chocolate Flavor Hives   Chocolate Hives   Tomato Hives     Prior to Admission medications   Medication Sig Start Date End Date Taking? Authorizing Provider  acetaminophen (TYLENOL) 500 MG tablet Take 500 mg by mouth every 6 (six) hours as needed for moderate pain or headache.   Yes [provider]  aspirin EC 81 MG tablet Take 81 mg by mouth daily.   Yes [provider]  colchicine 0.6 MG tablet Take 1 tablet (0.6 mg total) by mouth daily as needed. Patient taking differently: Take 0.6 mg by mouth daily as needed (for gout flares). 01/06/21  Yes Westley Chandler, MD  furosemide (LASIX) 40 MG tablet Take 40 mg by mouth daily. 06/25/23  Yes [provider]  hydrOXYzine (ATARAX) 25 MG tablet Take 25-50 mg by mouth every 6 (six) hours as needed for itching (or sleep).   Yes [provider]  losartan (COZAAR) 100 MG tablet Take 100 mg by mouth every evening.   Yes [provider]  mycophenolate (MYFORTIC) 360 MG TBEC EC tablet Take 720 mg by mouth 2 (two) times daily.   Yes [provider]  NARCAN 4 MG/0.1ML LIQD nasal spray kit Place 1 spray into the nose as needed (overdose). 12/02/19  Yes [provider]  pantoprazole (PROTONIX) 40 MG tablet Take 40 mg by mouth in the morning. 11/02/19  Yes [provider]  predniSONE (DELTASONE) 5 MG tablet Take 5 mg by mouth daily. 12/07/19  Yes [provider]  pregabalin (LYRICA) 75 MG capsule Take 75 mg by mouth 2 (two) times daily. 07/04/23  Yes [provider]   rivaroxaban (XARELTO) 2.5 MG TABS tablet Take 1 tablet (2.5 mg total) by mouth 2 (two) times daily. Patient taking differently: Take 5 mg by mouth every morning. 11/09/22  Yes Levin Erp, MD  rosuvastatin (CRESTOR) 5 MG tablet Take 5 mg by mouth daily.   Yes [provider]  sodium bicarbonate 650 MG tablet Take 1,300 mg by mouth 2 (two) times daily.   Yes [provider]  tacrolimus (PROGRAF) 1 MG capsule Take 2-3 mg by mouth See admin instructions. Take 3 mg by mouth in the morning and 2 mg at bedtime   Yes [provider]  tamsulosin (FLOMAX) 0.4 MG CAPS capsule Take 0.4 mg by mouth at bedtime. 08/28/19  Yes [provider]  blood glucose meter kit and supplies KIT Dispense based on patient and insurance preference. Use up to four times daily as directed. (FOR ICD-9 250.00, 250.01). 11/11/19   Rhetta Mura, MD  brimonidine (ALPHAGAN) 0.2 % ophthalmic solution Place 1 drop into the right eye 3 (three) times daily. Patient not taking: Reported on 07/15/2023 11/09/22   Levin Erp, MD    ___________________________________________________________________________________________________ Physical Exam:    07/15/2023    7:05 PM 12/28/2022    5:50 PM 12/28/2022    5:16 PM  Vitals with BMI  Systolic 169 154   Diastolic 83 72   Pulse 67 62 26     1. General:  in No ***Acute distress***increased work of breathing ***complaining of severe pain****agitated * Chronically ill *well *cachectic *toxic acutely ill -appearing 2. Psychological: Alert and *** Oriented 3. Head/ENT:   Moist *** Dry Mucous Membranes                          Head Non traumatic, neck supple  Normal *** Poor Dentition 4. SKIN: normal *** decreased Skin turgor,  Skin clean Dry and intact no rash    5. Heart: Regular rate and rhythm no*** Murmur, no Rub or gallop 6. Lungs: ***Clear to auscultation bilaterally, no wheezes or crackles   7. Abdomen:  Soft, ***non-tender, Non distended *** obese ***bowel sounds present 8. Lower extremities: no clubbing, cyanosis, no ***edema 9. Neurologically Grossly intact, moving all 4 extremities equally *** strength 5 out of 5 in all 4 extremities cranial nerves II through XII intact 10. MSK: Normal range of motion    Chart has been reviewed  ______________________________________________________________________________________________  Assessment/Plan  ***  Admitted for *** Vascular insufficiency of extremity ***  Acute pain of right foot ***  Elevated blood pressure reading ***  Essential hypertension ***  Stage 3b chronic kidney disease (HCC) ***  AKI (acute kidney injury) (HCC) ***  Peripheral arterial disease (HCC) ***    Present on Admission: **None**     No problem-specific Assessment & Plan notes found for this encounter.    Other plan as per orders.  DVT prophylaxis:  SCD *** Lovenox       Code Status:    Code Status: Prior FULL CODE *** DNR/DNI ***comfort care as per patient ***family  I had personally discussed CODE STATUS with patient and family*  ACP *** none has been reviewed ***   Family Communication:   Family not at  Bedside  plan of care was discussed on the phone with *** Son, Daughter, Wife, Husband, Sister, Brother , father, mother  Diet    Disposition Plan:   *** likely will need placement for rehabilitation                          Back to current facility when stable                            To home once workup is complete and patient is stable  ***Following barriers for discharge:                             Chest pain *** Stroke *** work up is complete                            Electrolytes corrected                               Anemia corrected h/H stable                             Pain controlled with PO medications                               Afebrile, white count improving able to transition to PO antibiotics                              Will need to be able to tolerate PO                            Will likely need home health, home O2,  set up                           Will need consultants to evaluate patient prior to discharge                        ***Would benefit from PT/OT eval prior to DC  Ordered                   Swallow eval - SLP ordered                   Diabetes care coordinator                   Transition of care consulted                   Nutrition    consulted                  Wound care  consulted                   Palliative care    consulted                   Behavioral health  consulted                    Consults called: ***     Admission status:  ED Disposition     ED Disposition  Admit   Condition  --   Comment  The patient appears reasonably stabilized for admission considering the current resources, flow, and capabilities available in the ED at this time, and I doubt any other [facility] requiring further screening and/or treatment in the ED prior to admission is  present.           Obs***  ***  inpatient     I Expect 2 midnight stay secondary to severity of patient's current illness need for inpatient interventions justified by the following: ***hemodynamic instability despite optimal treatment (tachycardia *hypotension * tachypnea *hypoxia, hypercapnia) * Severe lab/radiological/exam abnormalities including:     and extensive comorbidities including: *substance abuse  *Chronic pain *DM2  * CHF * CAD  * COPD/asthma *Morbid Obesity * CKD *dementia *liver disease *history of stroke with residual deficits *  malignancy, * sickle cell disease  History of amputation Chronic anticoagulation  That are currently affecting medical management.   I expect  patient to be hospitalized for 2 midnights requiring inpatient medical care.  Patient is at high risk for adverse outcome (such as loss of life or disability) if not treated.  Indication for inpatient stay  as follows:  Severe change from baseline regarding mental status Hemodynamic instability despite maximal medical therapy,  ongoing suicidal ideations,  severe pain requiring acute inpatient management,  inability to maintain oral hydration   persistent chest pain despite medical management Need for operative/procedural  intervention New or worsening hypoxia   Need for IV heparin    Level of care     tele  For   24H      Ashtan Girtman 07/15/2023, 11:21 PM ***  [facility]     after 2 AM please page floor coverage PA If 7AM-7PM, please contact the day team taking care of the patient using [email or web address]

## 2023-07-15 NOTE — ED Triage Notes (Signed)
Pt is coming in for right sided leg pain x 3 weeks, he has been seen at  3 weeks ago for the same and they attributed it to sciatic nerve pain. Pt states he has not had any relief in the pain between then and now. No other complaints at this time. Pt is on xarelto.    Medic Vitals   156/126 67hr 12rr 98%ra

## 2023-07-15 NOTE — ED Notes (Signed)
Report received from [facility] paramedic. Assumed care of pt at this time.

## 2023-07-15 NOTE — Assessment & Plan Note (Signed)
Continue home medications including M Myfortic 720 mg twice a day, prednisone 5 mg daily and Prograf 3 mg in the morning and 2 at bedtime

## 2023-07-15 NOTE — Assessment & Plan Note (Signed)
Hold Cozaar given bump in Cr and k

## 2023-07-15 NOTE — Assessment & Plan Note (Signed)
-  Order Sensitive  SSI     -  check TSH and HgA1C  - Hold by mouth medications*

## 2023-07-15 NOTE — Assessment & Plan Note (Signed)
On aspirin 81 mg a day continue Crestor 5 mg a day

## 2023-07-15 NOTE — Assessment & Plan Note (Signed)
Appreciate vascular surgery consult heparin per pharmacy n.p.o. postmidnight in case needs any procedures.  Given AKI held off on angiogram for tonight

## 2023-07-15 NOTE — ED Provider Notes (Addendum)
[place] EMERGENCY DEPARTMENT AT [facility] Provider Note   CSN: [id number] Arrival date & time: 07/15/23  1901     History  Chief Complaint  Patient presents with   Leg Pain    Theodore Arellano is a 62 y.o. male.  Pt with c/o right foot pain in past three weeks, but worse in past 1-2 days. Symptoms acute onset, moderate, persistent. No specific exacerbating or alleviating factors. Denies trauma  to area. No fever or chills. No new swelling or redness. No new numbness/weakness of leg or foot.   The history is provided by the patient and medical records.  Leg Pain Associated symptoms: no back pain, no fever and no neck pain        Home Medications Prior to Admission medications   Medication Sig Start Date End Date Taking? Authorizing Provider  acetaminophen (TYLENOL) 500 MG tablet Take 500 mg by mouth every 6 (six) hours as needed for moderate pain or headache.    [provider]  aspirin EC 81 MG tablet Take 81 mg by mouth daily.    [provider]  blood glucose meter kit and supplies KIT Dispense based on patient and insurance preference. Use up to four times daily as directed. (FOR ICD-9 250.00, 250.01). 11/11/19   Rhetta Mura, MD  brimonidine (ALPHAGAN) 0.2 % ophthalmic solution Place 1 drop into the right eye 3 (three) times daily. 11/09/22   Levin Erp, MD  colchicine 0.6 MG tablet Take 1 tablet (0.6 mg total) by mouth daily as needed. Patient taking differently: Take 0.6 mg by mouth daily as needed (for gout flares). 01/06/21   Westley Chandler, MD  hydrOXYzine (ATARAX) 25 MG tablet Take 25-50 mg by mouth every 6 (six) hours as needed for itching (or sleep).    [provider]  losartan (COZAAR) 25 MG tablet Take 25 mg by mouth daily.    [provider]  mycophenolate (MYFORTIC) 360 MG TBEC EC tablet Take 720 mg by mouth 2 (two) times daily.    [provider]  NARCAN 4 MG/0.1ML LIQD nasal spray kit Place 1  spray into the nose as needed (overdose). 12/02/19   [provider]  pantoprazole (PROTONIX) 40 MG tablet Take 40 mg by mouth in the morning. 11/02/19   [provider]  predniSONE (DELTASONE) 5 MG tablet Take 5 mg by mouth daily. 12/07/19   [provider]  rivaroxaban (XARELTO) 2.5 MG TABS tablet Take 1 tablet (2.5 mg total) by mouth 2 (two) times daily. 11/09/22   Levin Erp, MD  rosuvastatin (CRESTOR) 5 MG tablet Take 5 mg by mouth daily.    [provider]  sodium bicarbonate 650 MG tablet Take 1,300 mg by mouth 2 (two) times daily.    [provider]  tacrolimus (PROGRAF) 1 MG capsule Take 2-3 mg by mouth See admin instructions. Take 3 mg by mouth in the morning and 2 mg at bedtime    [provider]  tamsulosin (FLOMAX) 0.4 MG CAPS capsule Take 0.4 mg by mouth at bedtime. 08/28/19   [provider]      Allergies    Percocet [oxycodone-acetaminophen], Vicodin [hydrocodone-acetaminophen], Shellfish allergy, Chocolate flavor, Chocolate, and Tomato    Review of Systems   Review of Systems  Constitutional:  Negative for chills and fever.  Eyes:  Negative for redness.  Respiratory:  Negative for shortness of breath.   Cardiovascular:  Negative for chest pain.  Gastrointestinal:  Negative for  abdominal pain, nausea and vomiting.  Genitourinary:  Negative for flank pain.  Musculoskeletal:  Negative for back pain and neck pain.  Skin:  Negative for rash.  Neurological:  Negative for headaches.  Psychiatric/Behavioral:  Negative for confusion.     Physical Exam Updated Vital Signs BP (!) 169/83   Pulse 67   Temp 98 F (36.7 C) (Oral)   Resp 18   SpO2 100%  Physical Exam Vitals and nursing note reviewed.  Constitutional:      Appearance: Normal appearance. He is well-developed.  HENT:     Head: Atraumatic.     Nose: Nose normal.     Mouth/Throat:     Mouth: Mucous membranes are moist.  Eyes:     General: No  scleral icterus.    Conjunctiva/sclera: Conjunctivae normal.  Neck:     Trachea: No tracheal deviation.  Cardiovascular:     Rate and Rhythm: Normal rate and regular rhythm.     Pulses: Normal pulses.     Heart sounds: Normal heart sounds. No murmur heard.    No friction rub. No gallop.  Pulmonary:     Effort: Pulmonary effort is normal. No accessory muscle usage or respiratory distress.     Breath sounds: Normal breath sounds.  Abdominal:     General: There is no distension.     Palpations: Abdomen is soft.     Tenderness: There is no abdominal tenderness.  Musculoskeletal:        General: No swelling or tenderness.     Cervical back: Normal range of motion and neck supple. No rigidity.     Comments: Right foot is mildly cooler as compared to left. Foot is not white, mottled, insensate or weak, Not able to palpate or doppler pulse in right foot.   Skin:    General: Skin is warm and dry.     Findings: No rash.  Neurological:     Mental Status: He is alert.     Comments: Alert, speech clear. Motor/sens grossly intact bil. RLE is nvi w grossly intact motor/sens fxn.   Psychiatric:        Mood and Affect: Mood normal.     ED Results / Procedures / Treatments   Labs (all labs ordered are listed, but only abnormal results are displayed) Results for orders placed or performed during the hospital encounter of 07/15/23  Basic metabolic panel  Result Value Ref Range   Sodium 140 135 - 145 mmol/L   Potassium 5.2 (H) 3.5 - 5.1 mmol/L   Chloride 110 98 - 111 mmol/L   CO2 20 (L) 22 - 32 mmol/L   Glucose, Bld 89 70 - 99 mg/dL   BUN 34 (H) 8 - 23 mg/dL   Creatinine, Ser 1.61 (H) 0.61 - 1.24 mg/dL   Calcium 9.1 8.9 - 09.6 mg/dL   GFR, Estimated 38 (L) >60 mL/min   Anion gap 10 5 - 15  CBC  Result Value Ref Range   WBC 7.1 4.0 - 10.5 K/uL   RBC 4.86 4.22 - 5.81 MIL/uL   Hemoglobin 13.2 13.0 - 17.0 g/dL   HCT 04.5 40.9 - 81.1 %   MCV 86.2 80.0 - 100.0 fL   MCH 27.2 26.0 - 34.0 pg    MCHC 31.5 30.0 - 36.0 g/dL   RDW 91.4 (H) 78.2 - 95.6 %   Platelets 279 150 - 400 K/uL   nRBC 0.0 0.0 - 0.2 %     EKG EKG Interpretation Date/Time:  Monday July 15 2023 22:01:13 EDT Ventricular Rate:  72 PR Interval:  276 QRS Duration:  100 QT Interval:  416 QTC Calculation: 455 R Axis:   30  Text Interpretation: Sinus rhythm with 1st degree A-V block Left ventricular hypertrophy with repolarization abnormality Non-specific ST-t changes Confirmed by Cathren Laine ([id number]) on 07/15/2023 10:35:38 PM  Radiology No results found.  Procedures Procedures    Medications Ordered in ED Medications - No data to display  ED Course/ Medical Decision Making/ A&P                                 Medical Decision Making Problems Addressed: Acute pain of right foot: acute illness or injury    Details: Acute on chronic, 3 weeks, worse in past two days AKI (acute kidney injury) [facility]): acute illness or injury with systemic symptoms Elevated blood pressure reading: acute illness or injury Essential hypertension: chronic illness or injury with exacerbation, progression, or side effects of treatment that poses a threat to life or bodily functions Peripheral arterial disease (HCC): acute illness or injury with systemic symptoms that poses a threat to life or bodily functions    Details: Acute on chronic Stage 3b chronic kidney disease (HCC): chronic illness or injury with exacerbation, progression, or side effects of treatment that poses a threat to life or bodily functions Vascular insufficiency of extremity: acute illness or injury with systemic symptoms that poses a threat to life or bodily functions    Details: Critical limb ischemia.   Amount and/or Complexity of Data Reviewed Independent Historian: EMS    Details: hx External Data Reviewed: notes. Labs: ordered. Decision-making details documented in ED Course. Radiology: ordered and independent interpretation performed.  Decision-making details documented in ED Course. ECG/medicine tests: ordered and independent interpretation performed. Decision-making details documented in ED Course. Discussion of management or test interpretation with external provider(s): vascular  Risk Prescription drug management. Parenteral controlled substances. Decision regarding hospitalization.   Iv ns. Continuous pulse ox and cardiac monitoring. Labs ordered/sent. Imaging ordered.   Differential diagnosis includes arterial insufficiency, thrombus/embolus, msk pain, etc. Dispo decision including potential need for admission considered - will get labs and imaging and reassess.   Reviewed nursing notes and prior charts for additional history. External reports reviewed. Additional history from: EMS.   Cardiac monitor: sinus rhythm, rate 67.  Labs reviewed/interpreted by me - cr mildly increased from prior, hx ckd, current cr 1.9, hx renal transplant, therefore not able to get cta study (vascular lab has already gone home for day)  Wbc and hgb normal.   Vascular surgery consulted - await return call.  Vascular surgery re-called multiple times, secretary indicates is trying all numbers/ways to reach.   Morphine iv. Zofran iv. LR bolus.   Additional attempt to reach vascular.   Was able to reach Dr Karin Lieu via personal cell phone - discussed pt, right lower leg/foot pain x 3 weeks, worse in past two days, exam, no pulses,  prior surgery, etc. - he indicates admit to medicine, start on heparin, he will see in AM.   Medicine consulted for admission.   CRITICAL CARE RE: vascular insufficiency/critical limb ischemia RLE Performed by: Suzi Roots Total critical care time: 45 minutes Critical care time was exclusive of separately billable procedures and treating other patients. Critical care was necessary to treat or prevent imminent or life-threatening deterioration. Critical care was time spent personally by me on the  following activities: development of treatment plan with patient and/or surrogate as well as nursing, discussions with consultants, evaluation of patient's response to treatment, examination of patient, obtaining history from patient or surrogate, ordering and performing treatments and interventions, ordering and review of laboratory studies, ordering and review of radiographic studies, pulse oximetry and re-evaluation of patient's condition.  Re-check, pain controlled. No new c/o.   2316 awaiting medicine admission call-back - signed out to Dr Preston Fleeting to field call, and facilitate admission.       Final Clinical Impression(s) / ED Diagnoses Final diagnoses:  None    Rx / DC Orders ED Discharge Orders     None           Cathren Laine, MD 07/15/23 2317

## 2023-07-15 NOTE — Assessment & Plan Note (Signed)
On xarelto, now on hold pt nw on heparin

## 2023-07-15 NOTE — ED Provider Notes (Signed)
Case is discussed with Dr. Adela Glimpse of [facility], who agrees to admit the patient.   Dione Booze, MD 07/15/23 2337

## 2023-07-15 NOTE — Assessment & Plan Note (Signed)
-  Continue Crestor 5mg  daily

## 2023-07-15 NOTE — Assessment & Plan Note (Signed)
-  chronic avoid nephrotoxic medications such as NSAIDs, Vanco Zosyn combo,  avoid hypotension, continue to follow renal function

## 2023-07-15 NOTE — ED Notes (Signed)
Patient transported to X-ray 

## 2023-07-15 NOTE — ED Notes (Signed)
Lower leg swelling and pain for 3/wks. Pt now unable to walk on the leg. Toes noted to be slightly swollen.

## 2023-07-16 ENCOUNTER — Inpatient Hospital Stay (HOSPITAL_COMMUNITY): Payer: 59 | Admitting: Certified Registered"

## 2023-07-16 ENCOUNTER — Encounter (HOSPITAL_COMMUNITY): Payer: Self-pay | Admitting: Internal Medicine

## 2023-07-16 ENCOUNTER — Inpatient Hospital Stay (HOSPITAL_COMMUNITY): Payer: 59

## 2023-07-16 ENCOUNTER — Other Ambulatory Visit: Payer: Self-pay

## 2023-07-16 ENCOUNTER — Encounter (HOSPITAL_COMMUNITY)
Admission: EM | Disposition: A | Discharge status: Skilled Nursing Facility | Payer: Self-pay | Source: Home / Self Care | Attending: Internal Medicine

## 2023-07-16 ENCOUNTER — Encounter (HOSPITAL_COMMUNITY): Payer: 59

## 2023-07-16 DIAGNOSIS — N1832 Chronic kidney disease, stage 3b: Secondary | ICD-10-CM | POA: Diagnosis not present

## 2023-07-16 DIAGNOSIS — I2510 Atherosclerotic heart disease of native coronary artery without angina pectoris: Secondary | ICD-10-CM

## 2023-07-16 DIAGNOSIS — N179 Acute kidney failure, unspecified: Secondary | ICD-10-CM | POA: Diagnosis not present

## 2023-07-16 DIAGNOSIS — N186 End stage renal disease: Secondary | ICD-10-CM

## 2023-07-16 DIAGNOSIS — M79604 Pain in right leg: Secondary | ICD-10-CM | POA: Diagnosis not present

## 2023-07-16 DIAGNOSIS — I998 Other disorder of circulatory system: Secondary | ICD-10-CM | POA: Diagnosis not present

## 2023-07-16 DIAGNOSIS — T82868A Thrombosis of vascular prosthetic devices, implants and grafts, initial encounter: Principal | ICD-10-CM

## 2023-07-16 DIAGNOSIS — I10 Essential (primary) hypertension: Secondary | ICD-10-CM | POA: Diagnosis not present

## 2023-07-16 DIAGNOSIS — T82898A Other specified complication of vascular prosthetic devices, implants and grafts, initial encounter: Secondary | ICD-10-CM | POA: Diagnosis not present

## 2023-07-16 HISTORY — PX: EMBOLECTOMY: SHX44

## 2023-07-16 HISTORY — PX: AXILLARY-FEMORAL BYPASS GRAFT: SHX894

## 2023-07-16 LAB — CBC WITH DIFFERENTIAL/PLATELET
Abs Immature Granulocytes: 0.02 10*3/uL (ref 0.00–0.07)
Basophils Absolute: 0 10*3/uL (ref 0.0–0.1)
Basophils Relative: 0 %
Eosinophils Absolute: 0.2 10*3/uL (ref 0.0–0.5)
Eosinophils Relative: 3 %
HCT: 38.8 % — ABNORMAL LOW (ref 39.0–52.0)
Hemoglobin: 12.4 g/dL — ABNORMAL LOW (ref 13.0–17.0)
Immature Granulocytes: 0 %
Lymphocytes Relative: 22 %
Lymphs Abs: 1.5 10*3/uL (ref 0.7–4.0)
MCH: 27.3 pg (ref 26.0–34.0)
MCHC: 32 g/dL (ref 30.0–36.0)
MCV: 85.5 fL (ref 80.0–100.0)
Monocytes Absolute: 0.5 10*3/uL (ref 0.1–1.0)
Monocytes Relative: 7 %
Neutro Abs: 4.4 10*3/uL (ref 1.7–7.7)
Neutrophils Relative %: 68 %
Platelets: 241 10*3/uL (ref 150–400)
RBC: 4.54 MIL/uL (ref 4.22–5.81)
RDW: 16.3 % — ABNORMAL HIGH (ref 11.5–15.5)
WBC: 6.6 10*3/uL (ref 4.0–10.5)
nRBC: 0 % (ref 0.0–0.2)

## 2023-07-16 LAB — COMPREHENSIVE METABOLIC PANEL
ALT: 10 U/L (ref 0–44)
AST: 15 U/L (ref 15–41)
Albumin: 2.7 g/dL — ABNORMAL LOW (ref 3.5–5.0)
Alkaline Phosphatase: 53 U/L (ref 38–126)
Anion gap: 9 (ref 5–15)
BUN: 30 mg/dL — ABNORMAL HIGH (ref 8–23)
CO2: 23 mmol/L (ref 22–32)
Calcium: 8.9 mg/dL (ref 8.9–10.3)
Chloride: 108 mmol/L (ref 98–111)
Creatinine, Ser: 1.8 mg/dL — ABNORMAL HIGH (ref 0.61–1.24)
GFR, Estimated: 42 mL/min — ABNORMAL LOW (ref >60)
Glucose, Bld: 143 mg/dL — ABNORMAL HIGH (ref 70–99)
Potassium: 4 mmol/L (ref 3.5–5.1)
Sodium: 140 mmol/L (ref 135–145)
Total Bilirubin: 0.3 mg/dL (ref 0.3–1.2)
Total Protein: 6.5 g/dL (ref 6.5–8.1)

## 2023-07-16 LAB — MAGNESIUM: Magnesium: 1.7 mg/dL (ref 1.7–2.4)

## 2023-07-16 LAB — BLOOD GAS, VENOUS
Acid-Base Excess: 2 mmol/L (ref 0.0–2.0)
Bicarbonate: 29.7 mmol/L — ABNORMAL HIGH (ref 20.0–28.0)
O2 Saturation: 24.1 %
Patient temperature: 36.8
pCO2, Ven: 58 mm[Hg] (ref 44–60)
pH, Ven: 7.31 (ref 7.25–7.43)
pO2, Ven: <31 mm[Hg] — CL (ref 32–45)

## 2023-07-16 LAB — GLUCOSE, CAPILLARY
Glucose-Capillary: 104 mg/dL — ABNORMAL HIGH (ref 70–99)
Glucose-Capillary: 124 mg/dL — ABNORMAL HIGH (ref 70–99)
Glucose-Capillary: 129 mg/dL — ABNORMAL HIGH (ref 70–99)
Glucose-Capillary: 132 mg/dL — ABNORMAL HIGH (ref 70–99)
Glucose-Capillary: 133 mg/dL — ABNORMAL HIGH (ref 70–99)
Glucose-Capillary: 138 mg/dL — ABNORMAL HIGH (ref 70–99)
Glucose-Capillary: 166 mg/dL — ABNORMAL HIGH (ref 70–99)
Glucose-Capillary: 60 mg/dL — ABNORMAL LOW (ref 70–99)
Glucose-Capillary: 65 mg/dL — ABNORMAL LOW (ref 70–99)
Glucose-Capillary: 72 mg/dL (ref 70–99)
Glucose-Capillary: 75 mg/dL (ref 70–99)
Glucose-Capillary: 84 mg/dL (ref 70–99)

## 2023-07-16 LAB — HEPATIC FUNCTION PANEL
ALT: 14 U/L (ref 0–44)
AST: 16 U/L (ref 15–41)
Albumin: 2.6 g/dL — ABNORMAL LOW (ref 3.5–5.0)
Alkaline Phosphatase: 53 U/L (ref 38–126)
Bilirubin, Direct: <0.1 mg/dL (ref 0.0–0.2)
Total Bilirubin: 0.5 mg/dL (ref 0.3–1.2)
Total Protein: 6.6 g/dL (ref 6.5–8.1)

## 2023-07-16 LAB — HEMOGLOBIN A1C
Hgb A1c MFr Bld: 6.4 % — ABNORMAL HIGH (ref 4.8–5.6)
Mean Plasma Glucose: 136.98 mg/dL

## 2023-07-16 LAB — CK: Total CK: 70 U/L (ref 49–397)

## 2023-07-16 LAB — APTT
aPTT: 135 s — ABNORMAL HIGH (ref 24–36)
aPTT: 79 s — ABNORMAL HIGH (ref 24–36)

## 2023-07-16 LAB — PHOSPHORUS: Phosphorus: 2.5 mg/dL (ref 2.5–4.6)

## 2023-07-16 LAB — PREPARE RBC (CROSSMATCH)

## 2023-07-16 LAB — OSMOLALITY: Osmolality: 311 mosm/kg — ABNORMAL HIGH (ref 275–295)

## 2023-07-16 LAB — TSH: TSH: 2.451 u[IU]/mL (ref 0.350–4.500)

## 2023-07-16 LAB — HEPARIN LEVEL (UNFRACTIONATED): Heparin Unfractionated: 0.82 [IU]/mL — ABNORMAL HIGH (ref 0.30–0.70)

## 2023-07-16 LAB — PREALBUMIN: Prealbumin: 28 mg/dL (ref 18–38)

## 2023-07-16 LAB — PROCALCITONIN: Procalcitonin: <0.1 ng/mL

## 2023-07-16 SURGERY — PERIPHERAL VASCULAR THROMBECTOMY
Anesthesia: LOCAL

## 2023-07-16 SURGERY — CREATION, BYPASS, ARTERIAL, AXILLARY TO BILATERAL FEMORAL, USING GRAFT
Anesthesia: General | Site: Axilla | Laterality: Right

## 2023-07-16 MED ORDER — HEPARIN 6000 UNIT IRRIGATION SOLUTION
Status: DC | PRN
  Administered 2023-07-16: 1

## 2023-07-16 MED ORDER — HEPARIN (PORCINE) 25000 UT/250ML-% IV SOLN
INTRAVENOUS | Status: AC
  Filled 2023-07-16: qty 250

## 2023-07-16 MED ORDER — PANTOPRAZOLE SODIUM 40 MG PO TBEC
40.0000 mg | DELAYED_RELEASE_TABLET | Freq: Every morning | ORAL | Status: DC
  Administered 2023-07-16 – 2023-07-23 (×7): 40 mg via ORAL
  Filled 2023-07-16 (×7): qty 1

## 2023-07-16 MED ORDER — ROSUVASTATIN CALCIUM 5 MG PO TABS
5.0000 mg | ORAL_TABLET | Freq: Every day | ORAL | Status: DC
  Administered 2023-07-17 – 2023-07-23 (×6): 5 mg via ORAL
  Filled 2023-07-16 (×7): qty 1

## 2023-07-16 MED ORDER — LIDOCAINE 2% (20 MG/ML) 5 ML SYRINGE
INTRAMUSCULAR | Status: DC | PRN
  Administered 2023-07-16: 40 mg via INTRAVENOUS

## 2023-07-16 MED ORDER — DEXTROSE 50 % IV SOLN
25.0000 mL | Freq: Once | INTRAVENOUS | Status: AC
  Administered 2023-07-16: 25 mL via INTRAVENOUS
  Filled 2023-07-16: qty 50

## 2023-07-16 MED ORDER — LABETALOL HCL 5 MG/ML IV SOLN
INTRAVENOUS | Status: AC
  Filled 2023-07-16: qty 4

## 2023-07-16 MED ORDER — FENTANYL CITRATE (PF) 250 MCG/5ML IJ SOLN
INTRAMUSCULAR | Status: AC
  Filled 2023-07-16: qty 5

## 2023-07-16 MED ORDER — ONDANSETRON HCL 4 MG PO TABS: 4.0000 mg | ORAL_TABLET | Freq: Four times a day (QID) | ORAL | Status: DC | PRN

## 2023-07-16 MED ORDER — DEXAMETHASONE SODIUM PHOSPHATE 10 MG/ML IJ SOLN
INTRAMUSCULAR | Status: DC | PRN
  Administered 2023-07-16: 10 mg via INTRAVENOUS

## 2023-07-16 MED ORDER — HYDRALAZINE HCL 20 MG/ML IJ SOLN
10.0000 mg | Freq: Three times a day (TID) | INTRAMUSCULAR | Status: DC | PRN
  Administered 2023-07-16 – 2023-07-22 (×4): 10 mg via INTRAVENOUS
  Filled 2023-07-16 (×4): qty 1

## 2023-07-16 MED ORDER — HYDROMORPHONE HCL 1 MG/ML IJ SOLN
0.2500 mg | INTRAMUSCULAR | Status: DC | PRN
  Administered 2023-07-16: 0.5 mg via INTRAVENOUS

## 2023-07-16 MED ORDER — HYDROMORPHONE HCL 1 MG/ML IJ SOLN
INTRAMUSCULAR | Status: AC
  Filled 2023-07-16: qty 0.5

## 2023-07-16 MED ORDER — HEPARIN SODIUM (PORCINE) 1000 UNIT/ML IJ SOLN
INTRAMUSCULAR | Status: DC | PRN
Start: 2023-07-16 — End: 2023-07-16
  Administered 2023-07-16: 8000 [IU] via INTRAVENOUS
  Administered 2023-07-16 (×2): 3000 [IU] via INTRAVENOUS

## 2023-07-16 MED ORDER — PREDNISONE 5 MG PO TABS
5.0000 mg | ORAL_TABLET | Freq: Every day | ORAL | Status: DC
  Administered 2023-07-17 – 2023-07-23 (×6): 5 mg via ORAL
  Filled 2023-07-16 (×8): qty 1

## 2023-07-16 MED ORDER — MYCOPHENOLATE SODIUM 180 MG PO TBEC
720.0000 mg | DELAYED_RELEASE_TABLET | Freq: Two times a day (BID) | ORAL | Status: DC
  Administered 2023-07-16 – 2023-07-23 (×13): 720 mg via ORAL
  Filled 2023-07-16 (×16): qty 4

## 2023-07-16 MED ORDER — CHLORHEXIDINE GLUCONATE 0.12 % MT SOLN
OROMUCOSAL | Status: AC
  Filled 2023-07-16: qty 15

## 2023-07-16 MED ORDER — ACETAMINOPHEN 325 MG PO TABS: 650.0000 mg | ORAL_TABLET | Freq: Four times a day (QID) | ORAL | Status: DC | PRN

## 2023-07-16 MED ORDER — TACROLIMUS 1 MG PO CAPS
2.0000 mg | ORAL_CAPSULE | Freq: Every day | ORAL | Status: DC
  Administered 2023-07-16 – 2023-07-22 (×7): 2 mg via ORAL
  Filled 2023-07-16 (×8): qty 2

## 2023-07-16 MED ORDER — HEMOSTATIC AGENTS (NO CHARGE) OPTIME
TOPICAL | Status: DC | PRN
Start: 2023-07-16 — End: 2023-07-16
  Administered 2023-07-16: 2 via TOPICAL

## 2023-07-16 MED ORDER — HYDROMORPHONE HCL 1 MG/ML IJ SOLN
INTRAMUSCULAR | Status: DC | PRN
Start: 2023-07-16 — End: 2023-07-16
  Administered 2023-07-16: .5 mg via INTRAVENOUS

## 2023-07-16 MED ORDER — LABETALOL HCL 5 MG/ML IV SOLN
5.0000 mg | INTRAVENOUS | Status: DC | PRN
  Administered 2023-07-16: 5 mg via INTRAVENOUS

## 2023-07-16 MED ORDER — PREGABALIN 25 MG PO CAPS
75.0000 mg | ORAL_CAPSULE | Freq: Two times a day (BID) | ORAL | Status: DC
  Administered 2023-07-16 – 2023-07-23 (×13): 75 mg via ORAL
  Filled 2023-07-16 (×2): qty 3
  Filled 2023-07-16: qty 1
  Filled 2023-07-16 (×5): qty 3
  Filled 2023-07-16: qty 1
  Filled 2023-07-16 (×5): qty 3

## 2023-07-16 MED ORDER — TACROLIMUS 1 MG PO CAPS: 2.0000 mg | ORAL_CAPSULE | ORAL | Status: DC

## 2023-07-16 MED ORDER — PROPOFOL 10 MG/ML IV BOLUS
INTRAVENOUS | Status: DC | PRN
  Administered 2023-07-16: 140 mg via INTRAVENOUS

## 2023-07-16 MED ORDER — AMISULPRIDE (ANTIEMETIC) 5 MG/2ML IV SOLN: 10.0000 mg | Freq: Once | INTRAVENOUS | Status: DC | PRN

## 2023-07-16 MED ORDER — ROCURONIUM BROMIDE 10 MG/ML (PF) SYRINGE
PREFILLED_SYRINGE | INTRAVENOUS | Status: DC | PRN
  Administered 2023-07-16: 20 mg via INTRAVENOUS
  Administered 2023-07-16: 30 mg via INTRAVENOUS
  Administered 2023-07-16 (×2): 20 mg via INTRAVENOUS
  Administered 2023-07-16: 70 mg via INTRAVENOUS

## 2023-07-16 MED ORDER — ONDANSETRON HCL 4 MG/2ML IJ SOLN
INTRAMUSCULAR | Status: DC | PRN
  Administered 2023-07-16 (×2): 4 mg via INTRAVENOUS

## 2023-07-16 MED ORDER — LACTATED RINGERS IV SOLN: INTRAVENOUS | Status: DC

## 2023-07-16 MED ORDER — SODIUM BICARBONATE 650 MG PO TABS
1300.0000 mg | ORAL_TABLET | Freq: Two times a day (BID) | ORAL | Status: DC
  Administered 2023-07-16 – 2023-07-23 (×12): 1300 mg via ORAL
  Filled 2023-07-16 (×14): qty 2

## 2023-07-16 MED ORDER — ROCURONIUM BROMIDE 10 MG/ML (PF) SYRINGE
PREFILLED_SYRINGE | INTRAVENOUS | Status: AC
  Filled 2023-07-16: qty 10

## 2023-07-16 MED ORDER — CHLORHEXIDINE GLUCONATE 0.12 % MT SOLN
15.0000 mL | Freq: Once | OROMUCOSAL | Status: AC
  Filled 2023-07-16: qty 15

## 2023-07-16 MED ORDER — FENTANYL CITRATE (PF) 100 MCG/2ML IJ SOLN: 25.0000 ug | INTRAMUSCULAR | Status: DC | PRN

## 2023-07-16 MED ORDER — HEPARIN BOLUS VIA INFUSION
5000.0000 [IU] | Freq: Once | INTRAVENOUS | Status: AC
  Administered 2023-07-16: 5000 [IU] via INTRAVENOUS
  Filled 2023-07-16: qty 5000

## 2023-07-16 MED ORDER — FENTANYL CITRATE PF 50 MCG/ML IJ SOSY
12.5000 ug | PREFILLED_SYRINGE | INTRAMUSCULAR | Status: DC | PRN
  Administered 2023-07-17 (×3): 50 ug via INTRAVENOUS
  Administered 2023-07-17: 25 ug via INTRAVENOUS
  Administered 2023-07-18 (×2): 50 ug via INTRAVENOUS
  Filled 2023-07-16 (×6): qty 1

## 2023-07-16 MED ORDER — PHENYLEPHRINE 80 MCG/ML (10ML) SYRINGE FOR IV PUSH (FOR BLOOD PRESSURE SUPPORT)
PREFILLED_SYRINGE | INTRAVENOUS | Status: DC | PRN
  Administered 2023-07-16: 240 ug via INTRAVENOUS
  Administered 2023-07-16: 40 ug via INTRAVENOUS

## 2023-07-16 MED ORDER — SUGAMMADEX SODIUM 200 MG/2ML IV SOLN
INTRAVENOUS | Status: DC | PRN
  Administered 2023-07-16: 200 mg via INTRAVENOUS

## 2023-07-16 MED ORDER — HEPARIN (PORCINE) 25000 UT/250ML-% IV SOLN: 1000.0000 [IU]/h | INTRAVENOUS | Status: DC

## 2023-07-16 MED ORDER — HEPARIN (PORCINE) 25000 UT/250ML-% IV SOLN
500.0000 [IU]/h | INTRAVENOUS | Status: DC
  Administered 2023-07-16: 500 [IU]/h via INTRAVENOUS
  Filled 2023-07-16: qty 250

## 2023-07-16 MED ORDER — MIDAZOLAM HCL 2 MG/2ML IJ SOLN
INTRAMUSCULAR | Status: DC | PRN
Start: 2023-07-16 — End: 2023-07-16
  Administered 2023-07-16: .5 mg via INTRAVENOUS

## 2023-07-16 MED ORDER — PROTAMINE SULFATE 10 MG/ML IV SOLN
INTRAVENOUS | Status: DC | PRN
Start: 2023-07-16 — End: 2023-07-16
  Administered 2023-07-16: 25 mg via INTRAVENOUS

## 2023-07-16 MED ORDER — SODIUM CHLORIDE 0.9 % IV SOLN: INTRAVENOUS | Status: AC

## 2023-07-16 MED ORDER — ALBUTEROL SULFATE (2.5 MG/3ML) 0.083% IN NEBU: 2.5000 mg | INHALATION_SOLUTION | RESPIRATORY_TRACT | Status: DC | PRN

## 2023-07-16 MED ORDER — NOREPINEPHRINE BITARTRATE 1 MG/ML IV SOLN
INTRAVENOUS | Status: DC | PRN
  Administered 2023-07-16 (×2): .5 mL via INTRAVENOUS
  Administered 2023-07-16: 1 mL via INTRAVENOUS

## 2023-07-16 MED ORDER — LIDOCAINE 2% (20 MG/ML) 5 ML SYRINGE
INTRAMUSCULAR | Status: AC
  Filled 2023-07-16: qty 5

## 2023-07-16 MED ORDER — TAMSULOSIN HCL 0.4 MG PO CAPS
0.4000 mg | ORAL_CAPSULE | Freq: Every day | ORAL | Status: DC
  Administered 2023-07-17 – 2023-07-22 (×6): 0.4 mg via ORAL
  Filled 2023-07-16 (×6): qty 1

## 2023-07-16 MED ORDER — HYDROMORPHONE HCL 1 MG/ML IJ SOLN
INTRAMUSCULAR | Status: AC
  Filled 2023-07-16: qty 1

## 2023-07-16 MED ORDER — ONDANSETRON HCL 4 MG/2ML IJ SOLN
4.0000 mg | Freq: Four times a day (QID) | INTRAMUSCULAR | Status: DC | PRN
  Administered 2023-07-19: 4 mg via INTRAVENOUS

## 2023-07-16 MED ORDER — CEFAZOLIN SODIUM-DEXTROSE 2-3 GM-%(50ML) IV SOLR
INTRAVENOUS | Status: DC | PRN
Start: 2023-07-16 — End: 2023-07-16
  Administered 2023-07-16: 2 g via INTRAVENOUS

## 2023-07-16 MED ORDER — ORAL CARE MOUTH RINSE: 15.0000 mL | Freq: Once | OROMUCOSAL | Status: AC

## 2023-07-16 MED ORDER — ALBUMIN HUMAN 5 % IV SOLN
INTRAVENOUS | Status: DC | PRN
Start: 2023-07-16 — End: 2023-07-16

## 2023-07-16 MED ORDER — FENTANYL CITRATE (PF) 250 MCG/5ML IJ SOLN
INTRAMUSCULAR | Status: DC | PRN
  Administered 2023-07-16 (×4): 25 ug via INTRAVENOUS
  Administered 2023-07-16: 100 ug via INTRAVENOUS
  Administered 2023-07-16 (×2): 25 ug via INTRAVENOUS

## 2023-07-16 MED ORDER — DEXTROSE 50 % IV SOLN
12.5000 g | INTRAVENOUS | Status: AC
  Administered 2023-07-16: 12.5 g via INTRAVENOUS
  Filled 2023-07-16: qty 50

## 2023-07-16 MED ORDER — 0.9 % SODIUM CHLORIDE (POUR BTL) OPTIME
TOPICAL | Status: DC | PRN
  Administered 2023-07-16: 1000 mL

## 2023-07-16 MED ORDER — MUPIROCIN 2 % EX OINT
TOPICAL_OINTMENT | Freq: Two times a day (BID) | CUTANEOUS | Status: AC
  Administered 2023-07-17: 1 via NASAL
  Filled 2023-07-16 (×2): qty 22

## 2023-07-16 MED ORDER — HEPARIN 6000 UNIT IRRIGATION SOLUTION
Status: AC
  Filled 2023-07-16: qty 500

## 2023-07-16 MED ORDER — HEPARIN (PORCINE) 25000 UT/250ML-% IV SOLN
1000.0000 [IU]/h | INTRAVENOUS | Status: DC
  Administered 2023-07-16: 1200 [IU]/h via INTRAVENOUS
  Filled 2023-07-16: qty 250

## 2023-07-16 MED ORDER — TACROLIMUS 1 MG PO CAPS
3.0000 mg | ORAL_CAPSULE | Freq: Every morning | ORAL | Status: DC
  Administered 2023-07-17 – 2023-07-23 (×6): 3 mg via ORAL
  Filled 2023-07-16 (×8): qty 3

## 2023-07-16 MED ORDER — MIDAZOLAM HCL 2 MG/2ML IJ SOLN
INTRAMUSCULAR | Status: AC
  Filled 2023-07-16: qty 2

## 2023-07-16 MED ORDER — LOSARTAN POTASSIUM 50 MG PO TABS: 100.0000 mg | ORAL_TABLET | Freq: Every evening | ORAL | Status: DC

## 2023-07-16 MED ORDER — PROPOFOL 10 MG/ML IV BOLUS
INTRAVENOUS | Status: AC
  Filled 2023-07-16: qty 20

## 2023-07-16 MED ORDER — OXYCODONE HCL 5 MG PO TABS
5.0000 mg | ORAL_TABLET | ORAL | Status: DC | PRN
  Administered 2023-07-17 – 2023-07-18 (×2): 5 mg via ORAL
  Administered 2023-07-19 – 2023-07-23 (×2): 10 mg via ORAL
  Filled 2023-07-16 (×2): qty 2
  Filled 2023-07-16: qty 1
  Filled 2023-07-16: qty 2
  Filled 2023-07-16: qty 1
  Filled 2023-07-16: qty 2
  Filled 2023-07-16: qty 1

## 2023-07-16 MED ORDER — PHENYLEPHRINE HCL-NACL 20-0.9 MG/250ML-% IV SOLN
INTRAVENOUS | Status: DC | PRN
  Administered 2023-07-16: 50 ug/min via INTRAVENOUS

## 2023-07-16 MED ORDER — SODIUM CHLORIDE 0.9 % IV SOLN
INTRAVENOUS | Status: DC | PRN
Start: 2023-07-16 — End: 2023-07-16

## 2023-07-16 MED ORDER — ACETAMINOPHEN 650 MG RE SUPP: 650.0000 mg | Freq: Four times a day (QID) | RECTAL | Status: DC | PRN

## 2023-07-16 SURGICAL SUPPLY — 56 items
ADH SKN CLS APL DERMABOND .7 (GAUZE/BANDAGES/DRESSINGS) ×2
ADH SKN CLS LQ APL DERMABOND (GAUZE/BANDAGES/DRESSINGS) ×4
BAG COUNTER SPONGE SURGICOUNT (BAG) ×2 IMPLANT
BAG SPNG CNTER NS LX DISP (BAG) ×2
CANISTER SUCT 3000ML PPV (MISCELLANEOUS) ×2 IMPLANT
CATH EMB 3FR 40 (CATHETERS) IMPLANT
CATH EMB 3FR 80 (CATHETERS) IMPLANT
CATH EMB 4FR 80 (CATHETERS) IMPLANT
CATH EMB 5FR 80CM (CATHETERS) IMPLANT
CLIP LIGATING EXTRA MED SLVR (CLIP) ×2 IMPLANT
CLIP LIGATING EXTRA SM BLUE (MISCELLANEOUS) ×2 IMPLANT
DERMABOND ADVANCED .7 DNX12 (GAUZE/BANDAGES/DRESSINGS) ×2 IMPLANT
DERMABOND ADVANCED .7 DNX6 (GAUZE/BANDAGES/DRESSINGS) IMPLANT
DRAIN SNY 10X20 3/4 PERF (WOUND CARE) IMPLANT
DRAPE INCISE IOBAN 66X45 STRL (DRAPES) ×2 IMPLANT
DRAPE X-RAY CASS 24X20 (DRAPES) IMPLANT
ELECT REM PT RETURN 9FT ADLT (ELECTROSURGICAL) ×2
ELECTRODE REM PT RTRN 9FT ADLT (ELECTROSURGICAL) ×2 IMPLANT
EVACUATOR SILICONE 100CC (DRAIN) IMPLANT
GAUZE 4X4 16PLY ~~LOC~~+RFID DBL (SPONGE) IMPLANT
GLOVE BIO SURGEON STRL SZ7.5 (GLOVE) ×2 IMPLANT
GOWN STRL REUS W/ TWL LRG LVL3 (GOWN DISPOSABLE) ×4 IMPLANT
GOWN STRL REUS W/ TWL XL LVL3 (GOWN DISPOSABLE) ×2 IMPLANT
GOWN STRL REUS W/TWL LRG LVL3 (GOWN DISPOSABLE) ×4
GOWN STRL REUS W/TWL XL LVL3 (GOWN DISPOSABLE) ×2
GRAFT PROPATEN W/RING 8X80X70 (Vascular Products) IMPLANT
HEMOSTAT SNOW SURGICEL 2X4 (HEMOSTASIS) IMPLANT
INSERT FOGARTY SM (MISCELLANEOUS) IMPLANT
KIT BASIN OR (CUSTOM PROCEDURE TRAY) ×2 IMPLANT
KIT TURNOVER KIT B (KITS) ×2 IMPLANT
NS IRRIG 1000ML POUR BTL (IV SOLUTION) ×4 IMPLANT
PACK PERIPHERAL VASCULAR (CUSTOM PROCEDURE TRAY) ×2 IMPLANT
PAD ARMBOARD 7.5X6 YLW CONV (MISCELLANEOUS) ×4 IMPLANT
PATCH VASC XENOSURE 1X6 (Vascular Products) IMPLANT
POWDER SURGICEL 3.0 GRAM (HEMOSTASIS) IMPLANT
SET COLLECT BLD 21X3/4 12 (NEEDLE) IMPLANT
SPONGE T-LAP 18X18 ~~LOC~~+RFID (SPONGE) IMPLANT
STOPCOCK 4 WAY LG BORE MALE ST (IV SETS) IMPLANT
SUT GORETEX 5 0 TT13 24 (SUTURE) IMPLANT
SUT GORETEX 6.0 TT9 (SUTURE) IMPLANT
SUT MNCRL AB 4-0 PS2 18 (SUTURE) ×4 IMPLANT
SUT PROLENE 5 0 C 1 24 (SUTURE) IMPLANT
SUT PROLENE 5 0 C 1 36 (SUTURE) IMPLANT
SUT PROLENE 6 0 BV (SUTURE) ×2 IMPLANT
SUT PROLENE 6 0 CC (SUTURE) IMPLANT
SUT SILK 2 0 SH (SUTURE) IMPLANT
SUT VIC AB 2-0 CT1 27 (SUTURE) ×6
SUT VIC AB 2-0 CT1 TAPERPNT 27 (SUTURE) ×4 IMPLANT
SUT VIC AB 3-0 SH 27 (SUTURE) ×6
SUT VIC AB 3-0 SH 27X BRD (SUTURE) ×4 IMPLANT
SYR 3ML LL SCALE MARK (SYRINGE) IMPLANT
TAPE UMBILICAL 1/8X30 (MISCELLANEOUS) IMPLANT
TOWEL GREEN STERILE (TOWEL DISPOSABLE) ×2 IMPLANT
TRAY FOLEY MTR SLVR 16FR STAT (SET/KITS/TRAYS/PACK) ×2 IMPLANT
TUBING EXTENTION W/L.L. (IV SETS) IMPLANT
WATER STERILE IRR 1000ML POUR (IV SOLUTION) ×2 IMPLANT

## 2023-07-16 NOTE — Progress Notes (Signed)
Dr. Randie Heinz stated to leave Heparin running as ordered at this time.

## 2023-07-16 NOTE — Progress Notes (Signed)
PT Cancellation Note  Patient Details Name: Theodore Arellano MRN: [id number] DOB: 04-30-1961   Cancelled Treatment:    Reason Eval/Treat Not Completed: Medical issues which prohibited therapy. Pt pending vascular surgery to RLE. PT will follow up after surgery is complete.   Arlyss Gandy 07/16/2023, 12:45 PM

## 2023-07-16 NOTE — ED Notes (Signed)
Pt returned back to ED from xray.

## 2023-07-16 NOTE — Progress Notes (Incomplete)
PHARMACY - ANTICOAGULATION CONSULT NOTE  Pharmacy Consult for IV heparin Indication: critical limb ischemia   Allergies  Allergen Reactions   Percocet [Oxycodone-Acetaminophen] Hives and Itching   Vicodin [Hydrocodone-Acetaminophen] Hives and Itching   Shellfish Allergy Hives   Chocolate Flavor Hives   Chocolate Hives   Tomato Hives    Patient Measurements: Height: 5\' 7"  (170.2 cm) Weight: 77.2 kg (170 lb 3.1 oz) IBW/kg (Calculated) : 66.1 Heparin Dosing Weight: 77.2 kg  Vital Signs: Temp: 97.6 F (36.4 C) (10/15 0647) Temp Source: Oral (10/15 0647) BP: 132/79 (10/15 0647) Pulse Rate: 72 (10/15 0647)  Labs: Recent Labs    07/15/23 1932 07/16/23 0423 07/16/23 0425  HGB 13.2 12.4*  --   HCT 41.9 38.8*  --   PLT 279 241  --   APTT  --  135*  --   HEPARINUNFRC  --  0.82*  --   CREATININE 1.93*  --  1.80*  CKTOTAL  --  70  --     Estimated Creatinine Clearance: 39.3 mL/min (A) (by C-G formula based on SCr of 1.8 mg/dL (H)).   Medical History: Past Medical History:  Diagnosis Date   Anxiety    Arthritis    "lower back" (11/28/2017)   CAD (coronary artery disease)    Chronic lower back pain    H/O immunosuppressive therapy    chronic/notes 11/28/2017   History of gout    "before kidney transport" (11/28/2017)   Hyperlipidemia    Hypertension    Kidney disease    s/p kidney transplant 2011; "not on dialysis now" (11/28/2017)   Tobacco use     Assessment: Theodore Arellano is a 62 y.o. year old male admitted on 07/15/2023 with concern for critical limb ischemia. Xarelto 2.5mg  prior to admission (LD 10/14 @ 0830). Pharmacy consulted to dose heparin.  aPTT 135, supra therapeutic Heparin level 0.82, elevated as expected with pta DOAC No issues with bleeding or infusion per nurse.   Goal of Therapy:  Heparin level 0.3-0.7 units/ml aPTT: 66-102 Monitor platelets by anticoagulation protocol: Yes   Plan:  Hold heparin infusion x 1 hour and decrease heparin to  1000 units/hr 6 aPTT  Daily aPTT, heparin level, CBC, and monitoring for bleeding F/u plans for anticoagulation    Thank you for allowing pharmacy to participate in this patient's care.  Marja Kays, PharmD Emergency Medicine Clinical Pharmacist 07/16/2023,7:40 AM

## 2023-07-16 NOTE — Op Note (Signed)
Patient name: ABDURREHMAN Arellano MRN: [id number] DOB: 01/19/1961 Sex: male  07/16/2023 Pre-operative Diagnosis: Acute on chronic right lower extremity limb threatening ischemia [place] 2A with occluded right axillary to profunda and jump graft to below-knee popliteal artery bypass graft Post-operative diagnosis:  Same Surgeon:  Luanna Salk. Randie Heinz, MD Assistant: Clinton Gallant, PA Procedure Performed: 1.  Reexposure right axillary, right common femoral and right below-knee popliteal arteries greater than 30 days 2.  Thrombectomy of right axillary to profunda bypass and jump graft to below-knee popliteal artery with 3, 4 and 5 Fr Fogarty balloon catheters and thrombectomy right posterior tibial artery from below-knee approach 3.  Redo right axillary to profunda bypass with 8 mm ringed PTFE 4.  Bovine pericardial patch angioplasty right popliteal artery bypass graft onto the tibioperoneal trunk  Indications: 62 year old male has a history of a right common femoral based bypass with PTFE that has been ligated and now right lower extremity is revascularized via right axillary to profunda end to end bypass with dacron with jump graft to below-knee popliteal artery with PTFE.  Per patient he has had symptoms of right lower extremity numbness and pain for 3 weeks and he has evidence of entire graft occlusion by duplex.  He is now indicated for thrombectomy with possible redo bypass.  An experienced assistant was necessary to facilitate difficult exposure of the axillary artery and the bypass graft as well as the common femoral exposure which included the profunda branches and the PTFE and dacron grafts as well as redo exposure below the knee of the bypass graft as well as the tibioperoneal trunk and posterior tibial artery.  Findings: The bypass graft in the groin was completely occluded.  I could pass a 3 Fogarty and 4 Fogarty distally in the PTFE graft a total of 30 cm but could not get this to pass all the  way below the knee.  Proximally I could pass 3 4 and 5 Fogarty catheters approximately 20 cm.  There was an old occluded PTFE graft testicle common femoral artery all of which appeared occluded.  The axillary artery was exposed and we attempted thrombectomy from the axillary artery but unfortunately the Fogarty catheters would only pass approximately 30 cm.  I used ultrasound to attempt to determine where I could make the meat but ultimately the graft appeared to be crushed in the midsegment and as I could not pass Fogarty catheters proximally I elected I would need to repeat the bypass.  Below the knee there was significant laminated thrombus extending into the tibioperoneal trunk and this was all removed and we establish good backbleeding from the posterior tibial artery.  I ultimately performed bovine pericardial patch angioplasty extending from the tibioperoneal trunk back to the PTFE graft.  New 8 mm PTFE was sewn to a cuff of dacron at the level of the axillary artery to establish inflow into the groin and the PTFE graft distally was spatulated with a large cuff and sewn end to end incorporating both the profunda femoris and the existing PTFE bypass graft.  At completion there was a posterior tibial signal at the ankle that was graft dependent.   Procedure:  The patient was identified in the holding area and taken to the operating room where he was initially placed supine operative table and general anesthesia was induced.  He was sterilely prepped and draped in the right chest and shoulder area as well as the right lateral axilla down to the abdomen including the right groin  and the entire right lower extremity.  Antibiotics were administered timeout was called.  Heparin had been stopped on-call to the OR.  We began reopening his groin incision.  There was dense scar tissue as we dissected down identified the dacron graft which was occluded.  We also identified the common femoral artery with an old PTFE  graft that appeared chronically and densely occluded.  There was another PTFE graft that appeared attached off of the dacryon graft.  We initially administered additional heparin and he maintained heparinization throughout this case.  I initially performed transverse graftotomy and the dacryon graft.  Attempted past 3 4 and 5 Fogarty's proximally but could not get them past 20 cm.  I will also past 3 and 4 Fogarty's distally down the PTFE graft and returned significant thrombus but these would not pass beyond 30 cm and I established no backbleeding.  It appeared that I had problems with both inflow and outflow.  We then reopened the axillary incision dissected down to identify the subclavian vein we divided multiple branches for better exposure and identify the axillary artery proximally distally the graft and encircled this with Vesseloops.  These were then clamped and the transverse graftotomy was created and the dacryon.  I then attempted to pass Fogarty catheters down the axillary to profunda graft but these would only pass again about 30 cm.  I used an ultrasound to attempt to identify where these were being held up and the graft did appear crushed.  With this I like that I would need a new graft and I transected the dacryon there and trimmed away some of the distal dacryon and remove this.  In the groin I then also transected the dacryon graft and removed approximately 5 cm section of this and created a large spatulated area including the profunda and the existing PTFE graft.  An incision was then created below the knee at the site of the old incision we dissected down through dense scar tissue and took down the gastrocnemius and soleus muscles.  Multiple vein injuries were created these were repaired with Prolene and ultimately one of the paired popliteal veins was ligated between ties.  Ultimately identified the PTFE graft and dissected this down to the below-knee popliteal artery as well as the  tibioperoneal trunk and posterior tibial arteries.  I elected to make a vertical arteriotomy onto the graft as it appears that there was some calcification within this.  It was hard but I had mostly identified laminated thrombus and removed all of this.  I passed a 3 Fogarty and this was easily passed all the way and removed minimal thrombus from the posterior tibial artery and there was good backbleeding there and this was irrigated with heparinized saline and clamped.  I was able to flush saline between the PTFE graft and the groin and the below-knee popliteal and also passed 3 and 4 Fogarty's between the 2 until I was satisfied that the graft was clean.  I then used a bovine pericardium and sewed this in place with 5-0 Prolene as a patch angioplasty from the PTFE graft and onto the tibioperoneal trunk and again flushed with heparinized saline.  From the axilla we then tunneled an 8 mm tunneler along the anterior axillary line down to the groin.  There was some difficulty with this given the dense scar tissue from the previous graft and was tunneled somewhat posterior to this.  We then passed an 8 mm ringed PTFE.  This was flushed  with heparinized saline.  I then trimmed at the size and sewed and and to the existing dacryon cuff in the axilla.  This was done with 5-0 Prolene suture and upon completion we then flushed through the graft.  The graft was then clamped after being flushed with heparinized saline.  There was a good signal in the axillary artery distal after this was performed.  In the groin I then made a large spatulated cuff and sewed this in place around including the profunda and the existing PTFE graft and this was done with 5-0 Prolene suture.  Prior to completion we allowed flushing in all directions and irrigated with heparinized saline.  Upon completion there was a strong signal distally in the below-knee popliteal artery as well as the posterior tibial artery.  We administered 25 mg of protamine  and we evaluated all the incisions obtain hemostasis meticulously and irrigated with saline.  We then closed in layers with Vicryl and Monocryl.  The patient was awakened from anesthesia having tolerated the procedure without immediate complication.  All counts were correct at completion.  EBL: 1000 cc  Transfusion: 2 units packed red blood cells.    Heinz Eckert C. Randie Heinz, MD [facility] Office: [phone] Pager: [phone]

## 2023-07-16 NOTE — Consult Note (Signed)
Nephrology Consult   Requesting provider: Osvaldo Shipper Service requesting consult: Hospitalist Reason for consult: AKI on CKD3b   Assessment/Recommendations: Theodore Arellano is a/an 62 y.o. male with a past medical history HLD, HTN, CKD 3B status post renal transplant, PAD, HFrEF, CAD s/p CABG, cocaine use disorder   Non-Oliguric AKI on CKD 3b: Mild AKI w/ baseline of 1.6 and was 1.9 now 1.8. Maybe some decreased PO intake and also took an advil. Given Crt near baseline will not follow along at this time but are available if Crt worsens or other issues come up -can cont IVFs while NPO but then stop and encourage PO hydrate -hold home losartan for now -transplant meds as below -Continue to monitor daily Cr, Dose meds for GFR -Monitor Daily I/Os, Daily weight  -Maintain MAP>65 for optimal renal perfusion.  -Avoid nephrotoxic medications including NSAIDs -Use synthetic opioids (Fentanyl/Dilaudid) if needed  H/o Ktx: Occurred in 2011.  Some details unclear.  Follows with Dr. Kathrene Bongo outpatient -tac 3mg  in am and 2mg  in pm -myfortic 720mg  BID -pred 5mg  daily  Chronic metabolic acidosis: levels normal. hold bicarb 1300mg  BID  Ischemic foot/PAD: h/o PAD. Plan for open thrombectomy of his bypass graft with VVS. Appreciate help  Hypertension: BP improved most recently. Cocaine use in the past. May contribute to elevated BP. Will repeat urine tox  CAD: s/p CABG   Recommendations conveyed to primary service.    Darnell Level [facility] 07/16/2023 11:16 AM   _____________________________________________________________________________________ CC: leg pain  History of Present Illness: Theodore Arellano is a/an 62 y.o. male with a past medical history of HLD, HTN, CKD 3B status post renal transplant, PAD, HFrEF, CAD s/p CABG, cocaine use disorder who presents with leg pain.  Patient states he has been having worsening leg pain for several weeks felt to be nerve  related pain.  Tried taking some Tylenol as well as 1 dose of Advil with minimal relief.  Does not remember hurting it on anything.  Has not noticed any sores.  He denies recent fevers, chills, shortness of breath, chest pain, nausea, vomiting, diarrhea, dysuria, graft pain, hematuria.  In the emergency department there was concern about no pulse in his foot.  He was started on heparin and admitted for further evaluation.  Baseline creatinine seems to be around 1.6.  Creatinine on arrival was 1.9 and repeat today is 1.8. He follows with Dr. Kathrene Bongo in the outpatient setting.   Medications:  Current Facility-Administered Medications  Medication Dose Route Frequency Provider Last Rate Last Admin   0.9 %  sodium chloride infusion   Intravenous Continuous Therisa Doyne, MD 75 mL/hr at 07/16/23 0304 New Bag at 07/16/23 0304   acetaminophen (TYLENOL) tablet 650 mg  650 mg Oral Q6H PRN Therisa Doyne, MD       Or   acetaminophen (TYLENOL) suppository 650 mg  650 mg Rectal Q6H PRN Doutova, Anastassia, MD       albuterol (PROVENTIL) (2.5 MG/3ML) 0.083% nebulizer solution 2.5 mg  2.5 mg Nebulization Q2H PRN Doutova, Anastassia, MD       fentaNYL (SUBLIMAZE) injection 12.5-50 mcg  12.5-50 mcg Intravenous Q2H PRN Doutova, Anastassia, MD       heparin ADULT infusion 100 units/mL (25000 units/235mL)  1,000 Units/hr Intravenous Continuous Osvaldo Shipper, MD 10 mL/hr at 07/16/23 0640 1,000 Units/hr at 07/16/23 0640   hydrALAZINE (APRESOLINE) injection 10 mg  10 mg Intravenous Q8H PRN Sundil, Subrina, MD   10 mg at 07/16/23 0421  insulin aspart (novoLOG) injection 0-6 Units  0-6 Units Subcutaneous Q4H Therisa Doyne, MD   1 Units at 07/16/23 0341   mycophenolate (MYFORTIC) EC tablet 720 mg  720 mg Oral BID Therisa Doyne, MD   720 mg at 07/16/23 0259   ondansetron (ZOFRAN) tablet 4 mg  4 mg Oral Q6H PRN Therisa Doyne, MD       Or   ondansetron (ZOFRAN) injection 4 mg  4 mg  Intravenous Q6H PRN Doutova, Anastassia, MD       pantoprazole (PROTONIX) EC tablet 40 mg  40 mg Oral q AM Therisa Doyne, MD   40 mg at 07/16/23 0651   predniSONE (DELTASONE) tablet 5 mg  5 mg Oral Daily Doutova, Anastassia, MD       pregabalin (LYRICA) capsule 75 mg  75 mg Oral BID Doutova, Anastassia, MD   75 mg at 07/16/23 0300   rosuvastatin (CRESTOR) tablet 5 mg  5 mg Oral Daily Doutova, Anastassia, MD       sodium bicarbonate tablet 1,300 mg  1,300 mg Oral BID Doutova, Anastassia, MD   1,300 mg at 07/16/23 0259   tacrolimus (PROGRAF) capsule 3 mg  3 mg Oral q morning Doutova, Anastassia, MD       And   tacrolimus (PROGRAF) capsule 2 mg  2 mg Oral QHS Doutova, Anastassia, MD   2 mg at 07/16/23 0259   tamsulosin (FLOMAX) capsule 0.4 mg  0.4 mg Oral QHS Doutova, Anastassia, MD         ALLERGIES Percocet [oxycodone-acetaminophen], Vicodin [hydrocodone-acetaminophen], Shellfish allergy, Chocolate flavor, Chocolate, and Tomato  MEDICAL HISTORY Past Medical History:  Diagnosis Date   Anxiety    Arthritis    "lower back" (11/28/2017)   CAD (coronary artery disease)    Chronic lower back pain    H/O immunosuppressive therapy    chronic/notes 11/28/2017   History of gout    "before kidney transport" (11/28/2017)   Hyperlipidemia    Hypertension    Kidney disease    s/p kidney transplant 2011; "not on dialysis now" (11/28/2017)   Tobacco use      SOCIAL HISTORY Social History   Socioeconomic History   Marital status: Single    Spouse name: Not on file   Number of children: Not on file   Years of education: Not on file   Highest education level: Not on file  Occupational History   Occupation: Unemployed truck driver  Tobacco Use   Smoking status: Every Day    Current packs/day: 0.50    Average packs/day: 0.5 packs/day for 40.0 years (20.0 ttl pk-yrs)    Types: Cigarettes   Smokeless tobacco: Never  Vaping Use   Vaping status: Never Used  Substance and Sexual Activity    Alcohol use: Yes    Alcohol/week: 12.0 standard drinks of alcohol    Types: 12 Cans of beer per week   Drug use: No   Sexual activity: Never    Birth control/protection: None  Other Topics Concern   Not on file  Social History Narrative   Not on file   Social Determinants of Health   Financial Resource Strain: Not on file  Food Insecurity: No Food Insecurity (07/16/2023)   Hunger Vital Sign    Worried About Running Out of Food in the Last Year: Never true    Ran Out of Food in the Last Year: Never true  Transportation Needs: Unmet Transportation Needs (07/16/2023)   PRAPARE - Transportation    Lack  of Transportation (Medical): Yes    Lack of Transportation (Non-Medical): Yes  Physical Activity: Not on file  Stress: Not on file  Social Connections: Not on file  Intimate Partner Violence: Not At Risk (07/16/2023)   Humiliation, Afraid, Rape, and Kick questionnaire    Fear of Current or Ex-Partner: No    Emotionally Abused: No    Physically Abused: No    Sexually Abused: No     FAMILY HISTORY Family History  Problem Relation Age of Onset   Hypertension Father    Diabetes Mother       Review of Systems: 12 systems reviewed Otherwise as per HPI, all other systems reviewed and negative  Physical Exam: Vitals:   07/16/23 0408 07/16/23 0647  BP: (!) 180/96 132/79  Pulse: 69 72  Resp: 16 18  Temp: 98.2 F (36.8 C) 97.6 F (36.4 C)  SpO2: 99% 98%   Total I/O In: -  Out: 400 [Urine:400]  Intake/Output Summary (Last 24 hours) at 07/16/2023 1116 Last data filed at 07/16/2023 1018 Gross per 24 hour  Intake 1000 ml  Output 400 ml  Net 600 ml   General: Chronically ill appearing, no acute distress HEENT: anicteric sclera, oropharynx clear without lesions CV:, No murmurs, no lower extremity edema, no rub, normal rate Lungs: clear to auscultation bilaterally, normal work of breathing Abd: soft, non-tender, non-distended Skin: no visible lesions or  rashes Psych: alert, engaged, appropriate mood and affect Musculoskeletal: Cool right lower extremity at the foot, otherwise no obvious deformities Neuro: Decreased eyesight, otherwise normal speech, no gross focal deficits   Test Results Reviewed Lab Results  Component Value Date   NA 140 07/16/2023   K 4.0 07/16/2023   CL 108 07/16/2023   CO2 23 07/16/2023   BUN 30 (H) 07/16/2023   CREATININE 1.80 (H) 07/16/2023   CALCIUM 8.9 07/16/2023   ALBUMIN 2.7 (L) 07/16/2023   PHOS 2.5 07/16/2023    CBC Recent Labs  Lab 07/15/23 1932 07/16/23 0423  WBC 7.1 6.6  NEUTROABS  --  4.4  HGB 13.2 12.4*  HCT 41.9 38.8*  MCV 86.2 85.5  PLT 279 241    I have reviewed all relevant outside healthcare records related to the patient's current hospitalization

## 2023-07-16 NOTE — Assessment & Plan Note (Signed)
Beta-blockers on hold

## 2023-07-16 NOTE — Anesthesia Preprocedure Evaluation (Addendum)
Anesthesia Evaluation  Patient identified by MRN, date of birth, ID band Patient awake    Reviewed: Allergy & Precautions, NPO status , Patient's Chart, lab work & pertinent test results  Airway Mallampati: II  TM Distance: >3 FB Neck ROM: Full    Dental no notable dental hx. (+) Poor Dentition   Pulmonary Current Smoker and Patient abstained from smoking.   Pulmonary exam normal        Cardiovascular hypertension, Pt. on medications + CAD, + Peripheral Vascular Disease and +CHF  + dysrhythmias  Rhythm:Regular Rate:Normal  ECHO 2024:  1. Akinesis of the anteroseptal wall and apex; hypokinesis of the  inferior wall; overall moderate to severe LV dysfunction.   2. Left ventricular ejection fraction, by estimation, is 30 to 35%. The  left ventricle has moderately decreased function. The left ventricle  demonstrates regional wall motion abnormalities (see scoring  diagram/findings for description). The left  ventricular internal cavity size was mildly dilated. There is mild left  ventricular hypertrophy. Left ventricular diastolic parameters are  consistent with Grade I diastolic dysfunction (impaired relaxation).  Elevated left atrial pressure.   3. Right ventricular systolic function is normal. The right ventricular  size is normal.   4. The mitral valve is normal in structure. Mild mitral valve  regurgitation. No evidence of mitral stenosis.   5. The aortic valve is tricuspid. Aortic valve regurgitation is mild.  Aortic valve sclerosis/calcification is present, without any evidence of  aortic stenosis.   6. Aortic dilatation noted. There is mild dilatation of the aortic root,  measuring 40 mm.   7. The inferior vena cava is normal in size with greater than 50%  respiratory variability, suggesting right atrial pressure of 3 mmHg.     Neuro/Psych   Anxiety     negative neurological ROS     GI/Hepatic Neg liver ROS,GERD   Medicated,,  Endo/Other  diabetes    Renal/GU Renal diseaseS/p transplant  negative genitourinary   Musculoskeletal  (+) Arthritis , Osteoarthritis,    Abdominal Normal abdominal exam  (+)   Peds  Hematology  (+) Blood dyscrasia, anemia Lab Results      Component                Value               Date                      WBC                      6.6                 07/16/2023                HGB                      12.4 (L)            07/16/2023                HCT                      38.8 (L)            07/16/2023                MCV  85.5                07/16/2023                PLT                      241                 07/16/2023             Lab Results      Component                Value               Date                      NA                       140                 07/16/2023                K                        4.0                 07/16/2023                CO2                      23                  07/16/2023                GLUCOSE                  143 (H)             07/16/2023                BUN                      30 (H)              07/16/2023                CREATININE               1.80 (H)            07/16/2023                CALCIUM                  8.9                 07/16/2023                GFRNONAA                 42 (L)              07/16/2023              Anesthesia Other Findings   Reproductive/Obstetrics                             Anesthesia Physical Anesthesia Plan  ASA: 3  Anesthesia Plan: General  Post-op Pain Management:    Induction: Intravenous  PONV Risk Score and Plan: 1 and Ondansetron, Dexamethasone, Midazolam and Treatment may vary due to age or medical condition  Airway Management Planned: Mask and Oral ETT  Additional Equipment: Arterial line  Intra-op Plan:   Post-operative Plan: Possible Post-op intubation/ventilation  Informed Consent: I have reviewed the  patients History and Physical, chart, labs and discussed the procedure including the risks, benefits and alternatives for the proposed anesthesia with the patient or authorized representative who has indicated his/her understanding and acceptance.     Dental advisory given  Plan Discussed with:   Anesthesia Plan Comments:        Anesthesia Quick Evaluation

## 2023-07-16 NOTE — Progress Notes (Signed)
PHARMACY - ANTICOAGULATION CONSULT NOTE  Pharmacy Consult for IV heparin Indication: critical limb ischemia   Allergies  Allergen Reactions   Percocet [Oxycodone-Acetaminophen] Hives and Itching   Vicodin [Hydrocodone-Acetaminophen] Hives and Itching   Shellfish Allergy Hives   Chocolate Flavor Hives   Chocolate Hives   Tomato Hives    Patient Measurements: Height: 5\' 7"  (170.2 cm) Weight: 77.2 kg (170 lb 3.1 oz) IBW/kg (Calculated) : 66.1 Heparin Dosing Weight: 77.2 kg  Vital Signs: Temp: 97.9 F (36.6 C) (10/15 0004) Temp Source: Oral (10/15 0004) BP: 169/83 (10/14 1905) Pulse Rate: 67 (10/14 1905)  Labs: Recent Labs    07/15/23 1932  HGB 13.2  HCT 41.9  PLT 279  CREATININE 1.93*    Estimated Creatinine Clearance: 36.6 mL/min (A) (by C-G formula based on SCr of 1.93 mg/dL (H)).   Medical History: Past Medical History:  Diagnosis Date   Anxiety    Arthritis    "lower back" (11/28/2017)   CAD (coronary artery disease)    Chronic lower back pain    H/O immunosuppressive therapy    chronic/notes 11/28/2017   History of gout    "before kidney transport" (11/28/2017)   Hyperlipidemia    Hypertension    Kidney disease    s/p kidney transplant 2011; "not on dialysis now" (11/28/2017)   Tobacco use     Assessment: Theodore Arellano is a 62 y.o. year old male admitted on 07/15/2023 with concern for critical limb ischemia. Xarelto 2.5mg  prior to admission (LD 10/14 @ 0830). Pharmacy consulted to dose heparin.  Goal of Therapy:  Heparin level 0.3-0.7 units/ml Monitor platelets by anticoagulation protocol: Yes   Plan:  Heparin 5000 units x 1 as bolus followed by heparin infusion at 1200 units/hr 6 aPTT  Daily aPTT, heparin level, CBC, and monitoring for bleeding F/u plans for anticoagulation    Thank you for allowing pharmacy to participate in this patient's care.  Marja Kays, PharmD Emergency Medicine Clinical Pharmacist 07/16/2023,12:07 AM

## 2023-07-16 NOTE — Anesthesia Procedure Notes (Signed)
Arterial Line Insertion Start/End10/15/2024 3:45 PM, 07/16/2023 4:00 PM Performed by: Atilano Median, DO, anesthesiologist  Patient location: Pre-op. Preanesthetic checklist: patient identified, IV checked, site marked, risks and benefits discussed, surgical consent, monitors and equipment checked, pre-op evaluation, timeout performed and anesthesia consent Lidocaine 1% used for infiltration Left, radial was placed Catheter size: 20 G Hand hygiene performed  and maximum sterile barriers used   Attempts: 1 Procedure performed using ultrasound guided technique. Ultrasound Notes:anatomy identified, needle tip was noted to be adjacent to the nerve/plexus identified and no ultrasound evidence of intravascular and/or intraneural injection Following insertion, dressing applied and Biopatch. Post procedure assessment: normal and unchanged  Patient tolerated the procedure well with no immediate complications.

## 2023-07-16 NOTE — Progress Notes (Signed)
[facility] Pre-op Review 1.Surgical orders: Consent orders and signed; ordered, asked RN to complete Is patient alert and oriented to sign consent: yes, asked RN to complete Antibiotic: NA  2.  Pre-procedure checklist completed: asked RN to finish completing  3.  NPO: order placed at 0139 10/15  4.  CHG Bath completed: asked RN to complete Belongings removed and placed in clean gown: asked RN to complete  5.  Labs Performed: CBC WNL CMP/BMP WNL- renal transplant history; not on HD currently PT/INR NA HA1C 10/15 Type and Screen: ordered Surgical PCR: needs to be collected Pregnancy: NA EKG: 10/14 Chest x-ray: WNL 10/14   6.  Recent H&P or progress note if inpatient: completed  7.  Language Barrier:  none  8.  Vital Signs:  Oxygen: RA Tele: yes Can the patient travel without Tele  9.  Medications: Cardiac Drips: na Pain Medications: none today Beta Blocker: na Anticoagulants: heparin infusion, xarelto prior to admission GLP1:na  Pre-op Medications Ordered: na  10. IV access:20G LFA  11. Diabetic:     CBG:    132  @ 1004     Perioperative Glycemic Control Scale:  Sensitive  12. Additional Information that short stay should be aware of? Renal transplant, EF 30-35%

## 2023-07-16 NOTE — Anesthesia Procedure Notes (Signed)
Procedure Name: Intubation Date/Time: 07/16/2023 5:20 PM  Performed by: Darryl Nestle, CRNAPre-anesthesia Checklist: Patient identified, Emergency Drugs available, Suction available and Patient being monitored Patient Re-evaluated:Patient Re-evaluated prior to induction Oxygen Delivery Method: Circle system utilized Preoxygenation: Pre-oxygenation with 100% oxygen Induction Type: IV induction Ventilation: Mask ventilation without difficulty Laryngoscope Size: Mac and 4 Grade View: Grade I Tube type: Oral Tube size: 7.0 mm Number of attempts: 1 Airway Equipment and Method: Stylet and Oral airway Placement Confirmation: ETT inserted through vocal cords under direct vision, positive ETCO2 and breath sounds checked- equal and bilateral Secured at: 23 cm Tube secured with: Tape Dental Injury: Teeth and Oropharynx as per pre-operative assessment

## 2023-07-16 NOTE — Progress Notes (Signed)
MD at bedside wants to go ahead and start heparin gtt that he had originally scheduled for midnight.  MD able to get doppler signals on posterior tibial and popliteal.  MD wants to also warm foot.  Will continue to monitor signals.

## 2023-07-16 NOTE — Progress Notes (Signed)
Initial Nutrition Assessment  DOCUMENTATION CODES:   Not applicable  INTERVENTION:  Evening snack   NUTRITION DIAGNOSIS:   Increased nutrient needs related to chronic illness as evidenced by estimated needs.    GOAL:   Patient will meet greater than or equal to 90% of their needs    MONITOR:   PO intake, Supplement acceptance, Labs, Weight trends  REASON FOR ASSESSMENT:   Consult Assessment of nutrition requirement/status  ASSESSMENT:  62 y.o. M Presented to the hospital with a 3-week history of pain in the right leg.  Admitted with vascular insufficiency extremity, Pmhx:  HLD, HTN, CKD stage 3b h/o renal transplant, PAD, systolic CHF EF 30-35%, Legally blind in right eye, CAD status post CABG, gout, history of DVT on xarelto, Dm2, history of cocaine abuse.  Admit weight: 77.2 kg Current weight: 77.2 kg  Weight history: 07/16/23 77.2 kg  11/10/22 77.2 kg  11/09/22 77.2 kg      Average Meal Intake: NPO  Nutritionally Relevant Medications: Scheduled Meds:  mycophenolate  720 mg Oral BID   predniSONE  5 mg Oral Daily   rosuvastatin  5 mg Oral Daily   sodium bicarbonate  1,300 mg Oral BID   tacrolimus  3 mg Oral q morning    Continuous Infusions:  sodium chloride 75 mL/hr at 07/16/23 1116   heparin 1,000 Units/hr (07/16/23 0640)   PRN Meds:.acetaminophen **OR** acetaminophen, albuterol, fentaNYL (SUBLIMAZE) injection, hydrALAZINE, ondansetron **OR** ondansetron (ZOFRAN) IV  Labs Reviewed:  CBG ranges from 89-183 mg/dL over the last 24 hours     NUTRITION - FOCUSED PHYSICAL EXAM:  Flowsheet Row Most Recent Value  Orbital Region Mild depletion  Upper Arm Region Moderate depletion  Thoracic and Lumbar Region Mild depletion  Buccal Region Mild depletion  Temple Region Severe depletion  Clavicle Bone Region Mild depletion  Clavicle and Acromion Bone Region Mild depletion  Scapular Bone Region Mild depletion  Dorsal Hand Moderate depletion   Patellar Region Mild depletion  Anterior Thigh Region Moderate depletion  Posterior Calf Region Moderate depletion  Edema (RD Assessment) None  Hair Reviewed  Eyes Reviewed  Mouth Reviewed  Skin Reviewed  Nails Reviewed       Diet Order:   Diet Order             Diet NPO time specified  Diet effective now                   EDUCATION NEEDS:   No education needs have been identified at this time  Skin:  Skin Assessment: Reviewed RN Assessment  Last BM:  PTA  Height:   Ht Readings from Last 1 Encounters:  07/16/23 5\' 7"  (1.702 m)    Weight:   Wt Readings from Last 1 Encounters:  07/16/23 77.2 kg    Ideal Body Weight:     BMI:  Body mass index is 26.66 kg/m.  Estimated Nutritional Needs:   Kcal:  1950-2400 kcal  Protein:  100-115 g  Fluid:  34ml/kcal    Jamelle Haring RDN, LDN Clinical Dietitian  RDN pager # available on Amion

## 2023-07-16 NOTE — Progress Notes (Signed)
[facility] PROGRESS NOTE   Theodore Arellano UEA:[id number] DOB: Feb 17, 1961 DOA: 07/15/2023  PCP: Theodore Mask, MD  Brief History: 62 y.o. male with medical history significant for  HLD, HTN, CKD stage 3b h/o renal transplant, PAD, systolic CHF EF 30-35%, Legally blind in right eye, CAD status post CABG, gout, history of DVT on xarelto, Dm2, history of cocaine abuse, History of critical lower limb ischemia in 2022, Mobitz 1 heart block GERD glaucoma.  Presented with pain in the right lower extremity ongoing for about 3 weeks.  Concern was for vascular compromise.  Patient was hospitalized for further management.  Vascular surgery was consulted.  Consultants: Vascular surgery  Procedures: None yet    Subjective/Interval History: Pain is about 5 out of 10 in intensity in the right leg and foot.  He does mention that he has a history of gout as well.  Denies any chest pain shortness of breath.  No nausea vomiting.    Assessment/Plan:  Ischemia right lower extremity Vascular surgery is following.  Ultrasounds have been ordered.  Plan is for intervention later today.  Remains on IV heparin. Noted to be on statin.  Acute on chronic kidney disease stage IIIb Baseline creatinine seems to be around 1.5.  Came in with creatinine of 1.93.  Likely secondary to hypovolemia.  Gently hydrated.  Improvement in creatinine noted today to 1.80.  Monitor urine output.  Avoid nephrotoxic agents.  Repeat labs daily.  Potential for worsening renal function if vascular intervention requires contrast.  History of renal transplant He is noted to be on mycophenolate, prednisone, tacrolimus.  Continue these medications for now.  Chronic systolic and diastolic CHF Echocardiogram from February showed EF of 30 to 35%.  Seems to be fairly euvolemic.  Caution with IV fluids.  Holding furosemide due to AKI.  Essential hypertension Holding Cozaar due to bump in creatinine.  Coronary artery  disease Stable.  Diabetes mellitus type 2 with peripheral artery disease SSI.  Monitor CBGs.  HbA1c 6.4.  History of DVT Patient was on Xarelto prior to admission which is currently on hold.  Currently on IV heparin as discussed above.  DVT Prophylaxis: On IV heparin Code Status: Full code Family Communication: Discussed with patient.  No family at bedside Disposition Plan: Hopefully return home when improved  Status is: Inpatient Remains inpatient appropriate because: Limb ischemia    Medications: Scheduled:  insulin aspart  0-6 Units Subcutaneous Q4H   mycophenolate  720 mg Oral BID   pantoprazole  40 mg Oral q AM   predniSONE  5 mg Oral Daily   pregabalin  75 mg Oral BID   rosuvastatin  5 mg Oral Daily   sodium bicarbonate  1,300 mg Oral BID   tacrolimus  3 mg Oral q morning   And   tacrolimus  2 mg Oral QHS   tamsulosin  0.4 mg Oral QHS   Continuous:  sodium chloride 75 mL/hr at 07/16/23 0304   heparin 1,000 Units/hr (07/16/23 0640)   YNW:GNFAOZHYQMVHQ **OR** acetaminophen, albuterol, fentaNYL (SUBLIMAZE) injection, hydrALAZINE, ondansetron **OR** ondansetron (ZOFRAN) IV  Antibiotics: Anti-infectives (From admission, onward)    None       Objective:  Vital Signs  Vitals:   07/16/23 0038 07/16/23 0142 07/16/23 0408 07/16/23 0647  BP: (!) 175/89 (!) 190/96 (!) 180/96 132/79  Pulse: 75 80 69 72  Resp: 18 18 16 18   Temp:  97.6 F (36.4 C) 98.2 F (36.8 C) 97.6 F (36.4 C)  TempSrc:  Oral  Oral  SpO2: 96% 100% 99% 98%  Weight:      Height:        Intake/Output Summary (Last 24 hours) at 07/16/2023 0938 Last data filed at 07/16/2023 0519 Gross per 24 hour  Intake 1000 ml  Output 0 ml  Net 1000 ml   Filed Weights   07/16/23 0004  Weight: 77.2 kg    General appearance: Awake alert.  In no distress Resp: Clear to auscultation bilaterally.  Normal effort Cardio: S1-S2 is normal regular.  No S3-S4.  No rubs murmurs or bruit GI: Abdomen is soft.   Nontender nondistended.  Bowel sounds are present normal.  No masses organomegaly Extremities: Poorly palpable pulses in the lower extremity on the right. Neurologic: Alert and oriented x3.  No focal neurological deficits.    Lab Results:  Data Reviewed: I have personally reviewed following labs and reports of the imaging studies  CBC: Recent Labs  Lab 07/15/23 1932 07/16/23 0423  WBC 7.1 6.6  NEUTROABS  --  4.4  HGB 13.2 12.4*  HCT 41.9 38.8*  MCV 86.2 85.5  PLT 279 241    Basic Metabolic Panel: Recent Labs  Lab 07/15/23 1932 07/16/23 0423 07/16/23 0425  NA 140  --  140  K 5.2*  --  4.0  CL 110  --  108  CO2 20*  --  23  GLUCOSE 89  --  143*  BUN 34*  --  30*  CREATININE 1.93*  --  1.80*  CALCIUM 9.1  --  8.9  MG  --  1.7  --   PHOS  --  2.5  --     GFR: Estimated Creatinine Clearance: 39.3 mL/min (A) (by C-G formula based on SCr of 1.8 mg/dL (H)).  Liver Function Tests: Recent Labs  Lab 07/16/23 0423 07/16/23 0425  AST 16 15  ALT 14 10  ALKPHOS 53 53  BILITOT 0.5 0.3  PROT 6.6 6.5  ALBUMIN 2.6* 2.7*    Cardiac Enzymes: Recent Labs  Lab 07/16/23 0423  CKTOTAL 70    HbA1C: Recent Labs    07/16/23 0423  HGBA1C 6.4*    CBG: Recent Labs  Lab 07/16/23 0024 07/16/23 0329 07/16/23 0800  GLUCAP 84 166* 60*    Thyroid Function Tests: Recent Labs    07/16/23 0423  TSH 2.451    Radiology Studies: [facility] Chest Port 1 View  Result Date: 07/16/2023 CLINICAL DATA:  Preoperative evaluation. EXAM: PORTABLE CHEST 1 VIEW COMPARISON:  December 28, 2022 FINDINGS: Multiple sternal wires are noted. The cardiac silhouette is mildly enlarged and unchanged in size. A left atrial appendage clip is seen. Mild, diffuse, chronic appearing increased lung markings are seen without evidence of focal consolidation, pleural effusion or pneumothorax. The visualized skeletal structures are unremarkable. IMPRESSION: 1. Evidence of prior median sternotomy. 2. No acute or  active cardiopulmonary disease. Electronically Signed   By: Aram Candela M.D.   On: 07/16/2023 01:44       LOS: 1 day   Theodore Arellano  [facility] Pager on [email or web address]  07/16/2023, 9:38 AM

## 2023-07-16 NOTE — Progress Notes (Signed)
Tele called that patient 2nd degree AV block, patient denied any chest pain or SOB, on-call notified. See new order set.

## 2023-07-16 NOTE — ED Notes (Signed)
Pt transported to assigned unit accompanied by RN with belongings on telemetry.

## 2023-07-16 NOTE — Assessment & Plan Note (Signed)
Appreciate vascular consult on heparin n.p.o. postmidnight

## 2023-07-16 NOTE — Progress Notes (Signed)
    Patient evaluated in PACU.  All of his incisions are intact without hematoma.  He has a bounding right common femoral pulse.  The only signal at his ankle is a peroneal but his foot is warm and well-perfused.  Will plan to initiate 500 units of heparin per hour now and we will warm his foot.  With a bounding femoral pulse he should at least have profunda to provide flow even if his femoral to below-knee popliteal artery bypass has occluded.  If he later has issues with flow to the foot we will take him back to the operating room and plan for right common femoral to below-knee popliteal bypass to the level of the patch angioplasty site.  July Linam C. Randie Heinz, MD [facility] Office: [phone] Pager: [phone]

## 2023-07-16 NOTE — Progress Notes (Addendum)
History of Mobitz type I heart block Patient's nurse reporting that telemetry notified that patient has secondary AV block.  Per chart review patient has previous diagnosis of Mobitz type I heart block he has leers on Coreg which has been discontinued by cardiology.  Currently patient is asymptomatic.  Denies any chest pain, chest pressure and shortness of breath. -Spoke with on-call cardiology.  Review EKG. per cardiology Dr. Geraldo Pitter no intervention needed for this patient neither inpatient consult or outpatient follow-up.   Elevated blood pressure History of hypertension Per chart review patient's blood pressure is elevated 190/96.  Due to acute kidney injury losartan has been on hold on admission. -Ordering hydralazine 10 mg every 8 hours.  Tereasa Coop, MD [facility] 07/16/2023, 3:45 AM

## 2023-07-16 NOTE — Progress Notes (Signed)
OT Cancellation Note  Patient Details Name: Theodore Arellano MRN: [id number] DOB: 1961-07-10   Cancelled Treatment:    Reason Eval/Treat Not Completed: Patient at procedure or test/ unavailable (In surgery)  Barnard Sharps,H[facility] 07/16/2023, 2:49 PM Luisa Dago, OT/L   Acute OT Clinical Specialist Acute Rehabilitation Services Pager [phone] Office [phone]

## 2023-07-16 NOTE — Transfer of Care (Signed)
Immediate Anesthesia Transfer of Care Note  Patient: Theodore Arellano  Procedure(s) Performed: REDO RIGHT Colin Broach WITH PATCH ANGIOPLSTY RIGHT POPLITEAL ARTERY. (Right: Axilla) EMBOLECTOMY RIGHT Axilliofemoral and femoral/popliteal bypass graft. (Axilla)  Patient Location: PACU  Anesthesia Type:General  Level of Consciousness: awake and alert   Airway & Oxygen Therapy: Patient Spontanous Breathing and Patient connected to nasal cannula oxygen  Post-op Assessment: Report given to RN and Post -op Vital signs reviewed and stable  Post vital signs: Reviewed and stable  Last Vitals:  Vitals Value Taken Time  BP 149/107 07/16/23 2145  Temp 36.8 C 07/16/23 2145  Pulse 73 07/16/23 2150  Resp 12 07/16/23 2150  SpO2 100 % 07/16/23 2150  Vitals shown include unfiled device data.  Last Pain:  Vitals:   07/16/23 1440  TempSrc: Oral  PainSc: 0-No pain         Complications: No notable events documented.

## 2023-07-16 NOTE — Progress Notes (Signed)
PHARMACY - ANTICOAGULATION CONSULT NOTE  Pharmacy Consult for IV heparin Indication: critical limb ischemia   Allergies  Allergen Reactions   Percocet [Oxycodone-Acetaminophen] Hives and Itching   Vicodin [Hydrocodone-Acetaminophen] Hives and Itching   Shellfish Allergy Hives   Chocolate Flavor Hives   Chocolate Hives   Tomato Hives    Patient Measurements: Height: 5\' 7"  (170.2 cm) Weight: 77.2 kg (170 lb 3.1 oz) IBW/kg (Calculated) : 66.1 Heparin Dosing Weight: 77.2 kg  Vital Signs: Temp: 97.6 F (36.4 C) (10/15 1440) Temp Source: Oral (10/15 1440) BP: 189/98 (10/15 1454) Pulse Rate: 67 (10/15 1440)  Labs: Recent Labs    07/15/23 1932 07/16/23 0423 07/16/23 0425 07/16/23 1342  HGB 13.2 12.4*  --   --   HCT 41.9 38.8*  --   --   PLT 279 241  --   --   APTT  --  135*  --  79*  HEPARINUNFRC  --  0.82*  --   --   CREATININE 1.93*  --  1.80*  --   CKTOTAL  --  70  --   --     Estimated Creatinine Clearance: 39.3 mL/min (A) (by C-G formula based on SCr of 1.8 mg/dL (H)).   Medical History: Past Medical History:  Diagnosis Date   Anxiety    Arthritis    "lower back" (11/28/2017)   CAD (coronary artery disease)    Chronic lower back pain    H/O immunosuppressive therapy    chronic/notes 11/28/2017   History of gout    "before kidney transport" (11/28/2017)   Hyperlipidemia    Hypertension    Kidney disease    s/p kidney transplant 2011; "not on dialysis now" (11/28/2017)   Tobacco use     Assessment: Theodore Arellano is a 61 y.o. year old male admitted on 07/15/2023 with concern for critical limb ischemia. Xarelto 2.5mg  prior to admission (LD 10/14 @ 0830). Pharmacy consulted to dose heparin.  aPTT therapeutic this afternoon at 79. Patient taken for thrombectomy currently.  No issues with bleeding or infusion reported.   Goal of Therapy:  Heparin level 0.3-0.7 units/ml aPTT: 66-102 Monitor platelets by anticoagulation protocol: Yes   Plan:  Continue  heparin at 1000 units/hr Daily aPTT, heparin level, CBC, and monitoring for bleeding F/u plans for anticoagulation post-op   Thank you for allowing pharmacy to be a part of this patient's care.   Signe Colt, PharmD 07/16/2023 3:26 PM  **Pharmacist phone directory can be found on [email or web address] listed under [facility]**

## 2023-07-16 NOTE — Progress Notes (Addendum)
PHARMACY - ANTICOAGULATION CONSULT NOTE  Pharmacy Consult for IV heparin Indication: critical limb ischemia   Allergies  Allergen Reactions   Percocet [Oxycodone-Acetaminophen] Hives and Itching   Vicodin [Hydrocodone-Acetaminophen] Hives and Itching   Shellfish Allergy Hives   Chocolate Flavor Hives   Chocolate Hives   Tomato Hives    Patient Measurements: Height: 5\' 7"  (170.2 cm) Weight: 77.2 kg (170 lb 3.1 oz) IBW/kg (Calculated) : 66.1 Heparin Dosing Weight: 77.2 kg  Vital Signs: Temp: 98.2 F (36.8 C) (10/15 0408) Temp Source: Oral (10/15 0142) BP: 180/96 (10/15 0408) Pulse Rate: 69 (10/15 0408)  Labs: Recent Labs    07/15/23 1932 07/16/23 0423 07/16/23 0425  HGB 13.2 12.4*  --   HCT 41.9 38.8*  --   PLT 279 241  --   APTT  --  135*  --   HEPARINUNFRC  --  0.82*  --   CREATININE 1.93*  --  1.80*  CKTOTAL  --  70  --     Estimated Creatinine Clearance: 39.3 mL/min (A) (by C-G formula based on SCr of 1.8 mg/dL (H)).   Medical History: Past Medical History:  Diagnosis Date   Anxiety    Arthritis    "lower back" (11/28/2017)   CAD (coronary artery disease)    Chronic lower back pain    H/O immunosuppressive therapy    chronic/notes 11/28/2017   History of gout    "before kidney transport" (11/28/2017)   Hyperlipidemia    Hypertension    Kidney disease    s/p kidney transplant 2011; "not on dialysis now" (11/28/2017)   Tobacco use     Assessment: Theodore Arellano is a 62 y.o. year old male admitted on 07/15/2023 with concern for critical limb ischemia. Xarelto 2.5mg  prior to admission (LD 10/14 @ 0830). Pharmacy consulted to dose heparin.  aPTT 135, supra therapeutic Heparin level 0.82, elevated as expected with pta DOAC No issues with bleeding or infusion per nurse.   Goal of Therapy:  Heparin level 0.3-0.7 units/ml aPTT: 66-102 Monitor platelets by anticoagulation protocol: Yes   Plan:  Hold heparin infusion x 1 hour and decrease heparin to  1000 units/hr 6 aPTT  Daily aPTT, heparin level, CBC, and monitoring for bleeding F/u plans for anticoagulation    Thank you for allowing pharmacy to participate in this patient's care.  Marja Kays, PharmD Emergency Medicine Clinical Pharmacist 07/16/2023,6:06 AM

## 2023-07-16 NOTE — Interval H&P Note (Signed)
History and Physical Interval Note:  07/16/2023 4:07 PM  Theodore Arellano  has presented today for surgery, with the diagnosis of Occluded Right Bypass.  The various methods of treatment have been discussed with the patient and family. After consideration of risks, benefits and other options for treatment, the patient has consented to  Procedure(s): RIGHT AXILLARY-FEMORAL, FEMORAL-POPITEAL EMBOLECTOMY (Right) as a surgical intervention.  The patient's history has been reviewed, patient examined, no change in status, stable for surgery.  I have reviewed the patient's chart and labs.  Questions were answered to the patient's satisfaction.     Lemar Livings

## 2023-07-16 NOTE — H&P (Addendum)
Hospital Consult    Reason for Consult: Concern for right lower extremity critical limb ischemia Requesting Physician: ED MRN #:  [id number]  History of Present Illness: This is a 62 y.o. male well-known to the vascular surgery service having undergone right sided axillary to femoral, femoral to popliteal artery bypass for critical limb ischemia with a wound on the right foot.  The wound healed.  He was lost to follow-up.  Theodore Arellano presented to the hospital with a 3-week history of pain in the right leg.  This was initially thought to be radiculopathy, however on representation, he had no signals appreciated in the right foot.  States there is pain in the foot that keeps him from sleeping.  Denies significant tingling or motor loss.  Has trouble walking due to the pain.  Continues to smoke.    Past Medical History:  Diagnosis Date   Anxiety    Arthritis    "lower back" (11/28/2017)   CAD (coronary artery disease)    Chronic lower back pain    H/O immunosuppressive therapy    chronic/notes 11/28/2017   History of gout    "before kidney transport" (11/28/2017)   Hyperlipidemia    Hypertension    Kidney disease    s/p kidney transplant 2011; "not on dialysis now" (11/28/2017)   Tobacco use     Past Surgical History:  Procedure Laterality Date   ABDOMINAL AORTOGRAM W/LOWER EXTREMITY Bilateral 12/29/2020   Procedure: ABDOMINAL AORTOGRAM W/LOWER EXTREMITY;  Surgeon: Cephus Shelling, MD;  [facility]: [facility] IN[facility];  Service: Cardiovascular;  Laterality: Bilateral;   ARTERIOVENOUS GRAFT PLACEMENT Right 04/2007   /notes 02/01/2011   AV FISTULA PLACEMENT Left 12/20/2005; 12/2006   Hattie Perch 02/13/2011; Hattie Perch 02/13/2011   AXILLARY-FEMORAL BYPASS GRAFT Right 01/03/2021   Procedure: RIGHT AXILLARY TO PROFUNDA FEMORAL BYPASS;  Surgeon: Larina Earthly, MD;  Location: [facility] OR;  Service: Vascular;  Laterality: Right;   CARDIAC CATHETERIZATION     FEMORAL-POPLITEAL BYPASS GRAFT Right 01/03/2021    Procedure: RIGHT FEMORAL TO BELOW KNEE POPLITEAL ARTERY BYPASS;  Surgeon: Larina Earthly, MD;  Location: [facility] OR;  Service: Vascular;  Laterality: Right;   HD access procedures     INGUINAL HERNIA REPAIR Left    KIDNEY TRANSPLANT  2011   LEFT HEART CATH AND CORONARY ANGIOGRAPHY N/A 11/29/2017   Procedure: LEFT HEART CATH AND CORONARY ANGIOGRAPHY;  Surgeon: Marykay Lex, MD;  Location: [facility] INVASIVE CV LAB;  Service: Cardiovascular;  Laterality: N/A;   LEFT HEART CATH AND CORONARY ANGIOGRAPHY N/A 06/01/2019   Procedure: LEFT HEART CATH AND CORONARY ANGIOGRAPHY;  Surgeon: Kathleene Hazel, MD;  Location: [facility];  Service: Cardiovascular;  Laterality: N/A;   LEFT HEART CATHETERIZATION WITH CORONARY ANGIOGRAM  03/11/2012   Procedure: LEFT HEART CATHETERIZATION WITH CORONARY ANGIOGRAM;  Surgeon: Dolores Patty, MD;  Location: [facility] CATH LAB;  Service: Cardiovascular;;   RIGHT HEART CATHETERIZATION N/A 03/11/2012   Procedure: RIGHT HEART CATH;  Surgeon: Dolores Patty, MD;  Location: [facility] CATH LAB;  Service: Cardiovascular;  Laterality: N/A;   THROMBECTOMY Right 12/2007   Hattie Perch 02/01/2011   THROMBECTOMY / ARTERIOVENOUS GRAFT REVISION Left 12/2006   Hattie Perch 02/13/2011   THROMBECTOMY / ARTERIOVENOUS GRAFT REVISION Right 07/2007; 10/2007;01/2008;   Hattie Perch 01/31/2011; Hattie Perch 02/01/2011; Hattie Perch 01/31/2011   THROMBECTOMY AND REVISION OF ARTERIOVENTOUS (AV) GORETEX  GRAFT  03/2007 X 2   Hattie Perch 02/01/2011    Allergies  Allergen Reactions   Percocet [Oxycodone-Acetaminophen] Hives and Itching  Vicodin [Hydrocodone-Acetaminophen] Hives and Itching   Shellfish Allergy Hives   Chocolate Flavor Hives   Chocolate Hives   Tomato Hives    Prior to Admission medications   Medication Sig Start Date End Date Taking? Authorizing Provider  acetaminophen (TYLENOL) 500 MG tablet Take 500 mg by mouth every 6 (six) hours as needed for moderate pain or headache.   Yes [provider]  aspirin EC 81  MG tablet Take 81 mg by mouth daily.   Yes [provider]  colchicine 0.6 MG tablet Take 1 tablet (0.6 mg total) by mouth daily as needed. Patient taking differently: Take 0.6 mg by mouth daily as needed (for gout flares). 01/06/21  Yes Westley Chandler, MD  furosemide (LASIX) 40 MG tablet Take 40 mg by mouth daily. 06/25/23  Yes [provider]  hydrOXYzine (ATARAX) 25 MG tablet Take 25-50 mg by mouth every 6 (six) hours as needed for itching (or sleep).   Yes [provider]  losartan (COZAAR) 100 MG tablet Take 100 mg by mouth every evening.   Yes [provider]  mycophenolate (MYFORTIC) 360 MG TBEC EC tablet Take 720 mg by mouth 2 (two) times daily.   Yes [provider]  NARCAN 4 MG/0.1ML LIQD nasal spray kit Place 1 spray into the nose as needed (overdose). 12/02/19  Yes [provider]  pantoprazole (PROTONIX) 40 MG tablet Take 40 mg by mouth in the morning. 11/02/19  Yes [provider]  predniSONE (DELTASONE) 5 MG tablet Take 5 mg by mouth daily. 12/07/19  Yes [provider]  pregabalin (LYRICA) 75 MG capsule Take 75 mg by mouth 2 (two) times daily. 07/04/23  Yes [provider]  rivaroxaban (XARELTO) 2.5 MG TABS tablet Take 1 tablet (2.5 mg total) by mouth 2 (two) times daily. Patient taking differently: Take 5 mg by mouth every morning. 11/09/22  Yes Levin Erp, MD  rosuvastatin (CRESTOR) 5 MG tablet Take 5 mg by mouth daily.   Yes [provider]  sodium bicarbonate 650 MG tablet Take 1,300 mg by mouth 2 (two) times daily.   Yes [provider]  tacrolimus (PROGRAF) 1 MG capsule Take 2-3 mg by mouth See admin instructions. Take 3 mg by mouth in the morning and 2 mg at bedtime   Yes [provider]  tamsulosin (FLOMAX) 0.4 MG CAPS capsule Take 0.4 mg by mouth at bedtime. 08/28/19  Yes [provider]  blood glucose meter kit and supplies KIT Dispense based on patient and  insurance preference. Use up to four times daily as directed. (FOR ICD-9 250.00, 250.01). 11/11/19   Rhetta Mura, MD  brimonidine (ALPHAGAN) 0.2 % ophthalmic solution Place 1 drop into the right eye 3 (three) times daily. Patient not taking: Reported on 07/15/2023 11/09/22   Levin Erp, MD    Social History   Socioeconomic History   Marital status: Single    Spouse name: Not on file   Number of children: Not on file   Years of education: Not on file   Highest education level: Not on file  Occupational History   Occupation: Unemployed truck driver  Tobacco Use   Smoking status: Every Day    Current packs/day: 0.50    Average packs/day: 0.5 packs/day for 40.0 years (20.0 ttl pk-yrs)    Types: Cigarettes   Smokeless tobacco: Never  Vaping Use   Vaping status: Never Used  Substance and Sexual Activity   Alcohol use: Yes  Alcohol/week: 12.0 standard drinks of alcohol    Types: 12 Cans of beer per week   Drug use: No   Sexual activity: Never    Birth control/protection: None  Other Topics Concern   Not on file  Social History Narrative   Not on file   Social Determinants of Health   Financial Resource Strain: Not on file  Food Insecurity: No Food Insecurity (07/16/2023)   Hunger Vital Sign    Worried About Running Out of Food in the Last Year: Never true    Ran Out of Food in the Last Year: Never true  Transportation Needs: Unmet Transportation Needs (07/16/2023)   PRAPARE - Administrator, Civil Service (Medical): Yes    Lack of Transportation (Non-Medical): Yes  Physical Activity: Not on file  Stress: Not on file  Social Connections: Not on file  Intimate Partner Violence: Not At Risk (07/16/2023)   Humiliation, Afraid, Rape, and Kick questionnaire    Fear of Current or Ex-Partner: No    Emotionally Abused: No    Physically Abused: No    Sexually Abused: No    Family History  Problem Relation Age of Onset   Hypertension Father     Diabetes Mother     ROS: Otherwise negative unless mentioned in HPI  Physical Examination  Vitals:   07/16/23 0408 07/16/23 0647  BP: (!) 180/96 132/79  Pulse: 69 72  Resp: 16 18  Temp: 98.2 F (36.8 C) 97.6 F (36.4 C)  SpO2: 99% 98%   Body mass index is 26.66 kg/m.  General:  WDWN in NAD Gait: Not observed HENT: WNL, normocephalic Pulmonary: normal non-labored breathing, without Rales, rhonchi,  wheezing Cardiac: regular Abdomen: soft, NT/ND, no masses Skin: without rashes Vascular Exam/Pulses: Multiphasic signals in the left PT, no signals in the right foot. Extremities: without ischemic changes, without Gangrene , without cellulitis; without open wounds;  Musculoskeletal: no muscle wasting or atrophy  Neurologic: A&O X 3;  No focal weakness or paresthesias are detected; speech is fluent/normal Psychiatric:  The pt has Normal affect. Lymph:  Unremarkable  CBC    Component Value Date/Time   WBC 6.6 07/16/2023 0423   RBC 4.54 07/16/2023 0423   HGB 12.4 (L) 07/16/2023 0423   HCT 38.8 (L) 07/16/2023 0423   PLT 241 07/16/2023 0423   MCV 85.5 07/16/2023 0423   MCH 27.3 07/16/2023 0423   MCHC 32.0 07/16/2023 0423   RDW 16.3 (H) 07/16/2023 0423   LYMPHSABS 1.5 07/16/2023 0423   MONOABS 0.5 07/16/2023 0423   EOSABS 0.2 07/16/2023 0423   BASOSABS 0.0 07/16/2023 0423    BMET    Component Value Date/Time   NA 140 07/16/2023 0425   K 4.0 07/16/2023 0425   CL 108 07/16/2023 0425   CO2 23 07/16/2023 0425   GLUCOSE 143 (H) 07/16/2023 0425   BUN 30 (H) 07/16/2023 0425   CREATININE 1.80 (H) 07/16/2023 0425   CALCIUM 8.9 07/16/2023 0425   GFRNONAA 42 (L) 07/16/2023 0425   GFRAA >60 01/13/2020 0248    COAGS: Lab Results  Component Value Date   INR 1.6 (H) 01/12/2020   INR 1.0 05/30/2019   INR 0.98 11/28/2017     ASSESSMENT/PLAN: This is a 62 y.o. male with previous history of right-sided axillary to femoral femoral to popliteal artery bypass for critical  limb ischemia with tissue loss in the foot in 2022 with my partner Dr. Tawanna Cooler Early.  The axillary portion was completed with dacryon  graft, and the femoral-popliteal portion was completed with 6 mm ringed PTFE bypass.  On physical exam, there was no pulse appreciated in the bypass graft, however there was a multiphasic signal.  This extended into the groin.  The distal aspect of the actually bypass graft was sewn directly onto the profunda.  I think the right sided femoral-popliteal artery bypass is occluded judging by his pedal exam.  I have ordered axillary femoral, femoral-popliteal duplex ultrasound as the patient has CKD with a transplant kidney.   These results will drive surgical intervention lysis versus open thrombectomy.  I am concerned that lysis will require contrast, and that the bypass has been occluded now for 3 weeks which is a considerable amount of time.  In many cases, thrombolysis will not completely resolve chronic thrombus.  I have discussed the above with ultrasound to make his case and priorly.  Plan for intervention today. Please keep NPO.  Will discuss surgical plan with patient once ultrasound is finalized.   Victorino Sparrow MD [facility] [phone] 07/16/2023  8:22 AM  Ultrasound of the axillofemoral, femoropopliteal bypass reviewed.  All occluded. Cornell would benefit from open thrombectomy of both the axillofemoral and component bypass graft. I plan to discuss this with him shortly.  Plan be for right groin cutdown, embolectomy of the bypass graft both proximally and distally.

## 2023-07-16 NOTE — ED Notes (Signed)
ED TO INPATIENT HANDOFF REPORT  ED Nurse Name and Phone #: [phone]  S Name/Age/Gender Theodore Arellano Resides 62 y.o. male Room/Bed: H015C/H015C  Code Status   Code Status: Full Code  Home/SNF/Other Home Patient oriented to: self, place, time, and situation Is this baseline? Yes   Triage Complete: Triage complete  Chief Complaint Vascular insufficiency of extremity [I99.8]  Triage Note Pt is coming in for right sided leg pain x 3 weeks, he has been seen at [place] 3 weeks ago for the same and they attributed it to sciatic nerve pain. Pt states he has not had any relief in the pain between then and now. No other complaints at this time. Pt is on xarelto.    Medic Vitals   156/126 67hr 12rr 98%ra    Allergies Allergies  Allergen Reactions   Percocet [Oxycodone-Acetaminophen] Hives and Itching   Vicodin [Hydrocodone-Acetaminophen] Hives and Itching   Shellfish Allergy Hives   Chocolate Flavor Hives   Chocolate Hives   Tomato Hives    Level of Care/Admitting Diagnosis ED Disposition     ED Disposition  Admit   Condition  --   Comment  Hospital [facility] [[id number]]  Level of Care: Telemetry Medical [104]  May admit patient to [facility] or [facility] if equivalent level of care is available:: No  Covid Evaluation: Asymptomatic - no recent exposure (last 10 days) testing not required  Diagnosis: Vascular insufficiency of extremity [[id number]]  Admitting Physician: Therisa Doyne [[id number]]  Attending Physician: Therisa Doyne [[id number]]  Certification:: I certify this patient will need inpatient services for at least 2 midnights  Expected Medical Readiness: 07/18/2023          B Medical/Surgery History Past Medical History:  Diagnosis Date   Anxiety    Arthritis    "lower back" (11/28/2017)   CAD (coronary artery disease)    Chronic lower back pain    H/O immunosuppressive therapy    chronic/notes 11/28/2017   History of  gout    "before kidney transport" (11/28/2017)   Hyperlipidemia    Hypertension    Kidney disease    s/p kidney transplant 2011; "not on dialysis now" (11/28/2017)   Tobacco use    Past Surgical History:  Procedure Laterality Date   ABDOMINAL AORTOGRAM W/LOWER EXTREMITY Bilateral 12/29/2020   Procedure: ABDOMINAL AORTOGRAM W/LOWER EXTREMITY;  Surgeon: Cephus Shelling, MD;  [facility]: [facility] INVAS[facility] CV LAB;  Service: Cardiovascular;  Laterality: Bilateral;   ARTERIOVENOUS GRAFT PLACEMENT Right 04/2007   /notes 02/01/2011   AV FISTULA PLACEMENT Left 12/20/2005; 12/2006   Hattie Perch 02/13/2011; Hattie Perch 02/13/2011   AXILLARY-FEMORAL BYPASS GRAFT Right 01/03/2021   Procedure: RIGHT AXILLARY TO PROFUNDA FEMORAL BYPASS;  Surgeon: Larina Earthly, MD;  Location: [facility] OR;  Service: Vascular;  Laterality: Right;   CARDIAC CATHETERIZATION     FEMORAL-POPLITEAL BYPASS GRAFT Right 01/03/2021   Procedure: RIGHT FEMORAL TO BELOW KNEE POPLITEAL ARTERY BYPASS;  Surgeon: Larina Earthly, MD;  Location: [facility] OR;  Service: Vascular;  Laterality: Right;   HD access procedures     INGUINAL HERNIA REPAIR Left    KIDNEY TRANSPLANT  2011   LEFT HEART CATH AND CORONARY ANGIOGRAPHY N/A 11/29/2017   Procedure: LEFT HEART CATH AND CORONARY ANGIOGRAPHY;  Surgeon: Marykay Lex, MD;  Location: [facility] INVASIVE CV LAB;  Service: Cardiovascular;  Laterality: N/A;   LEFT HEART CATH AND CORONARY ANGIOGRAPHY N/A 06/01/2019   Procedure: LEFT HEART CATH AND CORONARY ANGIOGRAPHY;  Surgeon: Clifton James,  Nile Dear, MD;  Location: [facility] INVASIVE CV LAB;  Service: Cardiovascular;  Laterality: N/A;   LEFT HEART CATHETERIZATION WITH CORONARY ANGIOGRAM  03/11/2012   Procedure: LEFT HEART CATHETERIZATION WITH CORONARY ANGIOGRAM;  Surgeon: Dolores Patty, MD;  Location: [facility] CATH LAB;  Service: Cardiovascular;;   RIGHT HEART CATHETERIZATION N/A 03/11/2012   Procedure: RIGHT HEART CATH;  Surgeon: Dolores Patty, MD;  Location: [facility] CATH LAB;  Service:  Cardiovascular;  Laterality: N/A;   THROMBECTOMY Right 12/2007   Hattie Perch 02/01/2011   THROMBECTOMY / ARTERIOVENOUS GRAFT REVISION Left 12/2006   Hattie Perch 02/13/2011   THROMBECTOMY / ARTERIOVENOUS GRAFT REVISION Right 07/2007; 10/2007;01/2008;   Hattie Perch 01/31/2011; Hattie Perch 02/01/2011; Hattie Perch 01/31/2011   THROMBECTOMY AND REVISION OF ARTERIOVENTOUS (AV) GORETEX  GRAFT  03/2007 X 2   Hattie Perch 02/01/2011     A IV Location/Drains/Wounds Patient Lines/Drains/Airways Status     Active Line/Drains/Airways     Name Placement date Placement time Site Days   Peripheral IV 07/15/23 20 G Anterior;Left Forearm 07/15/23  1933  Forearm  1   Fistula / Graft Right Upper arm 11/28/17  1900  Upper arm  2056   Wound / Incision (Open or Dehisced) 11/08/19 Non-pressure wound Leg Left;Lower;Circumferential 11/08/19  1900  Leg  1346   Wound / Incision (Open or Dehisced) 12/27/20 Pretibial Right vertical open wound with black eschar and surrounding erythema 12/27/20  2350  Pretibial  931            Intake/Output Last 24 hours  Intake/Output Summary (Last 24 hours) at 07/16/2023 0038 Last data filed at 07/16/2023 0008 Gross per 24 hour  Intake 1000 ml  Output --  Net 1000 ml    Labs/Imaging Results for orders placed or performed during the hospital encounter of 07/15/23 (from the past 48 hour(s))  Basic metabolic panel     Status: Abnormal   Collection Time: 07/15/23  7:32 PM  Result Value Ref Range   Sodium 140 135 - 145 mmol/L   Potassium 5.2 (H) 3.5 - 5.1 mmol/L   Chloride 110 98 - 111 mmol/L   CO2 20 (L) 22 - 32 mmol/L   Glucose, Bld 89 70 - 99 mg/dL    Comment: Glucose reference range applies only to samples taken after fasting for at least 8 hours.   BUN 34 (H) 8 - 23 mg/dL   Creatinine, Ser 9.56 (H) 0.61 - 1.24 mg/dL   Calcium 9.1 8.9 - 21.3 mg/dL   GFR, Estimated 38 (L) >60 mL/min    Comment: (NOTE) Calculated using the CKD-EPI Creatinine Equation (2021)    Anion gap 10 5 - 15    Comment:  Performed at [facility], [address] [place], [place] [address]  CBC     Status: Abnormal   Collection Time: 07/15/23  7:32 PM  Result Value Ref Range   WBC 7.1 4.0 - 10.5 K/uL   RBC 4.86 4.22 - 5.81 MIL/uL   Hemoglobin 13.2 13.0 - 17.0 g/dL   HCT 84.6 96.2 - 95.2 %   MCV 86.2 80.0 - 100.0 fL   MCH 27.2 26.0 - 34.0 pg   MCHC 31.5 30.0 - 36.0 g/dL   RDW 84.1 (H) 32.4 - 40.1 %   Platelets 279 150 - 400 K/uL   nRBC 0.0 0.0 - 0.2 %    Comment: Performed at [facility], [address] [place] [address] [place], [place] [address]   No results found.  Pending Labs [employer] (From admission,  onward)     Start     Ordered   07/16/23 0500  Prealbumin  Tomorrow morning,   R        07/15/23 2343   07/16/23 0500  Heparin level (unfractionated)  Daily,   R      07/16/23 0010   07/16/23 0500  APTT  Daily,   R      07/16/23 0010   07/16/23 0500  CBC  Daily,   R      07/16/23 0010   07/16/23 0200  Basic metabolic panel  Once,   R        07/16/23 0024   07/15/23 2344  Hemoglobin A1c  Add-on,   AD       Comments: To assess prior glycemic control    07/15/23 2343   07/15/23 2343  Procalcitonin  Add-on,   AD       References:    Procalcitonin Lower Respiratory Tract Infection AND Sepsis Procalcitonin Algorithm   07/15/23 2343   07/15/23 2343  Blood gas, venous  ONCE - STAT,   STAT       Question:  Release to patient  Answer:  Immediate   07/15/23 2343   07/15/23 2343  CK  Add-on,   AD        07/15/23 2343   07/15/23 2343  CBC with Differential/Platelet  Add-on,   AD       Question:  Release to patient  Answer:  Immediate   07/15/23 2343   07/15/23 2343  Creatinine, urine, random  Once,   R        07/15/23 2343   07/15/23 2343  Osmolality, urine  Once,   R        07/15/23 2343   07/15/23 2343  Osmolality  Add-on,   AD        07/15/23 2343   07/15/23 2343  Magnesium  Add-on,   AD        07/15/23 2343   07/15/23 2343  Phosphorus  Add-on,   AD        07/15/23 2343   07/15/23  2343  Hepatic function panel  Add-on,   AD       Question:  Release to patient  Answer:  Immediate   07/15/23 2343   07/15/23 2343  TSH  Add-on,   AD        07/15/23 2343   07/15/23 2237  Urinalysis, Routine w reflex microscopic -Urine, Clean Catch  ONCE - STAT,   URGENT       Question:  Specimen Source  Answer:  Urine, Clean Catch   07/15/23 2236   07/15/23 2237  Rapid urine drug screen (hospital performed)  ONCE - STAT,   STAT        07/15/23 2236   Signed and Held  Magnesium  Tomorrow morning,   R        Signed and Held   Signed and Held  Phosphorus  Tomorrow morning,   R        Signed and Held   Signed and Held  Comprehensive metabolic panel  Tomorrow morning,   R       Question:  Release to patient  Answer:  Immediate   Signed and Held   Signed and Held  CBC  Tomorrow morning,   R       Question:  Release to patient  Answer:  Immediate   Signed and Held  Vitals/Pain Today's Vitals   07/15/23 1905 07/16/23 0004 07/16/23 0034 07/16/23 0038  BP: (!) 169/83   (!) 175/89  Pulse: 67   75  Resp: 18   18  Temp: 98 F (36.7 C) 97.9 F (36.6 C) 98.2 F (36.8 C)   TempSrc: Oral Oral Oral   SpO2: 100%   96%  Weight:  77.2 kg    Height:  5\' 7"  (1.702 m)    PainSc:        Isolation Precautions No active isolations  Medications Medications  insulin aspart (novoLOG) injection 0-6 Units ( Subcutaneous Not Given 07/16/23 0029)  heparin ADULT infusion 100 units/mL (25000 units/252mL) (1,200 Units/hr Intravenous New Bag/Given 07/16/23 0032)  lactated ringers bolus 1,000 mL (0 mLs Intravenous Stopped 07/16/23 0008)  morphine (PF) 4 MG/ML injection 4 mg (4 mg Intravenous Given 07/15/23 2306)  ondansetron (ZOFRAN) injection 4 mg (4 mg Intravenous Given 07/15/23 2306)  heparin bolus via infusion 5,000 Units (5,000 Units Intravenous Bolus from Bag 07/16/23 0036)    Mobility walks with device     Focused Assessments    R Recommendations: See Admitting Provider  Note  Report given to:   Additional Notes: Heparin infusing

## 2023-07-16 NOTE — Assessment & Plan Note (Signed)
-  Spoke about importance of quitting spent 5 minutes discussing options for treatment, prior attempts at quitting, and dangers of smoking  -At this point patient is    interested in quitting  - refused nicotine patch   - nursing tobacco cessation protocol

## 2023-07-17 ENCOUNTER — Encounter (HOSPITAL_COMMUNITY)
Admission: EM | Disposition: A | Discharge status: Skilled Nursing Facility | Payer: Self-pay | Source: Home / Self Care | Attending: Internal Medicine

## 2023-07-17 ENCOUNTER — Inpatient Hospital Stay (HOSPITAL_COMMUNITY): Payer: 59

## 2023-07-17 ENCOUNTER — Encounter (HOSPITAL_COMMUNITY): Payer: Self-pay | Admitting: Vascular Surgery

## 2023-07-17 DIAGNOSIS — I70301 Unspecified atherosclerosis of unspecified type of bypass graft(s) of the extremities, right leg: Secondary | ICD-10-CM | POA: Diagnosis not present

## 2023-07-17 DIAGNOSIS — I998 Other disorder of circulatory system: Secondary | ICD-10-CM | POA: Diagnosis not present

## 2023-07-17 LAB — BASIC METABOLIC PANEL
Anion gap: 11 (ref 5–15)
BUN: 23 mg/dL (ref 8–23)
CO2: 19 mmol/L — ABNORMAL LOW (ref 22–32)
Calcium: 8.2 mg/dL — ABNORMAL LOW (ref 8.9–10.3)
Chloride: 107 mmol/L (ref 98–111)
Creatinine, Ser: 1.89 mg/dL — ABNORMAL HIGH (ref 0.61–1.24)
GFR, Estimated: 39 mL/min — ABNORMAL LOW (ref >60)
Glucose, Bld: 189 mg/dL — ABNORMAL HIGH (ref 70–99)
Potassium: 4.3 mmol/L (ref 3.5–5.1)
Sodium: 137 mmol/L (ref 135–145)

## 2023-07-17 LAB — BPAM RBC
Blood Product Expiration Date: 202411112359
Blood Product Expiration Date: 202411112359
ISSUE DATE / TIME: 202410152038
ISSUE DATE / TIME: 202410152038
Unit Type and Rh: 5100
Unit Type and Rh: 5100

## 2023-07-17 LAB — GLUCOSE, CAPILLARY
Glucose-Capillary: 157 mg/dL — ABNORMAL HIGH (ref 70–99)
Glucose-Capillary: 139 mg/dL — ABNORMAL HIGH (ref 70–99)
Glucose-Capillary: 115 mg/dL — ABNORMAL HIGH (ref 70–99)
Glucose-Capillary: 123 mg/dL — ABNORMAL HIGH (ref 70–99)
Glucose-Capillary: 115 mg/dL — ABNORMAL HIGH (ref 70–99)
Glucose-Capillary: 116 mg/dL — ABNORMAL HIGH (ref 70–99)

## 2023-07-17 LAB — CBC
HCT: 35.4 % — ABNORMAL LOW (ref 39.0–52.0)
Hemoglobin: 11.5 g/dL — ABNORMAL LOW (ref 13.0–17.0)
MCH: 28.2 pg (ref 26.0–34.0)
MCHC: 32.5 g/dL (ref 30.0–36.0)
MCV: 86.8 fL (ref 80.0–100.0)
Platelets: 195 10*3/uL (ref 150–400)
RBC: 4.08 MIL/uL — ABNORMAL LOW (ref 4.22–5.81)
RDW: 16.1 % — ABNORMAL HIGH (ref 11.5–15.5)
WBC: 10.3 10*3/uL (ref 4.0–10.5)
nRBC: 0 % (ref 0.0–0.2)

## 2023-07-17 LAB — APTT
aPTT: 37 s — ABNORMAL HIGH (ref 24–36)
aPTT: 56 s — ABNORMAL HIGH (ref 24–36)

## 2023-07-17 LAB — TYPE AND SCREEN
ABO/RH(D): O POS
Antibody Screen: NEGATIVE
Unit division: 0
Unit division: 0

## 2023-07-17 LAB — HEPARIN LEVEL (UNFRACTIONATED): Heparin Unfractionated: 0.15 [IU]/mL — ABNORMAL LOW (ref 0.30–0.70)

## 2023-07-17 SURGERY — PERIPHERAL VASCULAR THROMBECTOMY
Anesthesia: LOCAL

## 2023-07-17 MED ORDER — HEPARIN (PORCINE) 25000 UT/250ML-% IV SOLN
1150.0000 [IU]/h | INTRAVENOUS | Status: DC
  Administered 2023-07-18 – 2023-07-19 (×2): 1150 [IU]/h via INTRAVENOUS
  Filled 2023-07-17 (×2): qty 250

## 2023-07-17 MED ORDER — HEPARIN (PORCINE) 25000 UT/250ML-% IV SOLN
1000.0000 [IU]/h | INTRAVENOUS | Status: DC
  Administered 2023-07-17: 1000 [IU]/h via INTRAVENOUS

## 2023-07-17 NOTE — Evaluation (Signed)
Physical Therapy Evaluation Patient Details Name: Theodore Arellano MRN: [id number] DOB: 12/24/1960 Today's Date: 07/17/2023  History of Present Illness  Pt is a 62 y/o M admitted on 07/15/23 after presenting with c/o RLE pain. Pt is s/p R axillary-femoral, femoral popliteal embolectomy. PMH: HLD, HTN, CKD 3B, renal transplant, PAD, systolic CHF, legally blind in R eye, CAD s/p CABG, gout, cocaine abuse, glaucoma  Clinical Impression  Pt seen for PT evaluation with pt agreeable. Pt reports prior to admission he was living alone, ambulatory with cane, but already looking for assistance for household cleaning. On this date, pt is able to complete supine>sit with supervision with bed rails, using LUE/LLE primarily to assist with mobility as pt limited by RUE/RLE pain. Pt requires assistance to don sock on RLE. Pt progresses to ambulating into hallway with RW & supervision with impaired gait pattern as noted below. At this time, recommend post acute rehab <3 hours therapy/day to maximize independence with mobility. If pt declines this & prefers to go home, recommend HHPT, tub transfer bench, RW, and BSC.        If plan is discharge home, recommend the following: A little help with walking and/or transfers;A little help with bathing/dressing/bathroom;Assistance with cooking/housework;Assist for transportation;Help with stairs or ramp for entrance   Can travel by private vehicle   Yes    Equipment Recommendations Rolling walker (2 wheels);BSC/3in1 (tub transfer bench)  Recommendations for Other Services       Functional Status Assessment Patient has had a recent decline in their functional status and demonstrates the ability to make significant improvements in function in a reasonable and predictable amount of time.     Precautions / Restrictions Precautions Precautions: Fall Restrictions Weight Bearing Restrictions: No      Mobility  Bed Mobility Overal bed mobility: Needs Assistance Bed  Mobility: Supine to Sit     Supine to sit: Supervision, HOB elevated, Used rails          Transfers Overall transfer level: Needs assistance Equipment used: Rolling walker (2 wheels) Transfers: Sit to/from Stand Sit to Stand: Supervision           General transfer comment: STS from EOB without assistance.    Ambulation/Gait Ambulation/Gait assistance: Supervision Gait Distance (Feet): 50 Feet Assistive device: Rolling walker (2 wheels) Gait Pattern/deviations: Decreased stride length, Decreased dorsiflexion - right, Decreased dorsiflexion - left, Decreased step length - right, Decreased step length - left, Step-to pattern Gait velocity: decreased        Stairs            Wheelchair Mobility     Tilt Bed    Modified Rankin (Stroke Patients Only)       Balance Overall balance assessment: Needs assistance Sitting-balance support: Feet supported, No upper extremity supported Sitting balance-Leahy Scale: Fair     Standing balance support: During functional activity, Bilateral upper extremity supported, Reliant on assistive device for balance Standing balance-Leahy Scale: Fair                               Pertinent Vitals/Pain Pain Assessment Pain Assessment: 0-10 Pain Score: 8  Pain Location: R foot Pain Descriptors / Indicators: Sore Pain Intervention(s): Monitored during session (pt declined pain meds nurse offered)    Home Living Family/patient expects to be discharged to:: Private residence Living Arrangements: Alone   Type of Home: Apartment Home Access: Stairs to enter   [employer] of Steps:  1   Home Layout: One level Home Equipment: Cane - single point      Prior Function               Mobility Comments: Pt reports he's ambulatory with cane, denies falls. ADLs Comments: Pt reports he was already looking for assistance for household chores prior to admission. Unable to tolerate standing for bathing so  instead sat down in tub but had difficulty getting up out of tub.     Extremity/Trunk Assessment   Upper Extremity Assessment Upper Extremity Assessment:  (RUE & RLE limited by pain following procedure)    Lower Extremity Assessment Lower Extremity Assessment: Generalized weakness (RUE & RLE limited by pain following procedure)    Cervical / Trunk Assessment Cervical / Trunk Assessment: Normal  Communication   Communication Communication: No apparent difficulties  Cognition Arousal: Alert Behavior During Therapy: WFL for tasks assessed/performed Overall Cognitive Status: Within Functional Limits for tasks assessed                                          General Comments General comments (skin integrity, edema, etc.): Pt on room air throughout session with SpO2 >90%. Pt required assistance for donning socks RLE sitting EOB.    Exercises     Assessment/Plan    PT Assessment Patient needs continued PT services  PT Problem List Decreased strength;Cardiopulmonary status limiting activity;Pain;Decreased range of motion;Decreased activity tolerance;Decreased balance;Decreased mobility;Decreased safety awareness;Decreased knowledge of use of DME       PT Treatment Interventions DME instruction;Balance training;Gait training;Neuromuscular re-education;Stair training;Functional mobility training;Therapeutic activities;Therapeutic exercise;Manual techniques;Patient/family education;Modalities    PT Goals (Current goals can be found in the Care Plan section)  Acute Rehab PT Goals Patient Stated Goal: get better, for his leg to be okay PT Goal Formulation: With patient Time For Goal Achievement: 07/31/23 Potential to Achieve Goals: Good    Frequency Min 1X/week     Co-evaluation               AM-PAC PT "6 Clicks" Mobility  Outcome Measure Help needed turning from your back to your side while in a flat bed without using bedrails?: None Help needed  moving from lying on your back to sitting on the side of a flat bed without using bedrails?: A Little Help needed moving to and from a bed to a chair (including a wheelchair)?: A Little Help needed standing up from a chair using your arms (e.g., wheelchair or bedside chair)?: A Little Help needed to walk in hospital room?: A Little Help needed climbing 3-5 steps with a railing? : A Little 6 Click Score: 19    End of Session   Activity Tolerance: Patient limited by pain;Patient tolerated treatment well Patient left: in chair;with chair alarm set;with call bell/phone within reach Nurse Communication: Mobility status PT Visit Diagnosis: Muscle weakness (generalized) (M62.81);Pain;Other abnormalities of gait and mobility (R26.89);Difficulty in walking, not elsewhere classified (R26.2) Pain - Right/Left: Right Pain - part of body: Leg    Time: 0981-1914 PT Time Calculation (min) (ACUTE ONLY): 24 min   Charges:   PT Evaluation $PT Eval Low Complexity: 1 Low   PT General Charges $$ ACUTE PT VISIT: 1 Visit         Aleda Grana, PT, DPT 07/17/23, 11:14 AM   Sandi Mariscal 07/17/2023, 11:09 AM

## 2023-07-17 NOTE — Progress Notes (Signed)
PROGRESS NOTE  Theodore Arellano  DOB: 1961/09/11  PCP: [name], MD GNF:[id number]  DOA: 07/15/2023  LOS: 2 days  Hospital Day: 3  Brief narrative: Theodore Arellano is a 62 y.o. male with PMH significant for DM2, HTN, HLD, CAD/CABG, systolic CHF, CKD stage 3b h/o renal transplant,  PAD with h/o critical lower limb ischemia in 2022, legally blind in right eye,  h/o DVT on xarelto, h/o cocaine abuse, GERD, gout, glaucoma.   10/14, patient presented with pain in the right lower extremity ongoing for about 3 weeks.  Concern was for vascular compromise.  Patient was hospitalized for further management.  Vascular surgery was consulted.   Subjective: Patient was seen and examined this morning. Middle-aged African-American male. Sitting up in bed recliner.  Not in distress.  Seen by PT. No family at bedside. Chart reviewed In the last 24 hours, afebrile, blood pressure elevated to 160s mostly, breathing on room air Last set of blood work from this morning with WC 10.3, hemoglobin 11.5, BUN/creatinine 23/1.89  Assessment and plan: Acute on chronic ischemia right lower extremity S/p thrombectomy, angioplasty and bypass -10/15 Dr. Pascal Lux Currently on heparin drip. Continue Crestor   H/o CAD/CABG No anginal symptoms currently.     AKI on CKD 3B Chronic metabolic acidosis H/o renal transplant Baseline creatinine seems to be around 1.5.   Came in with creatinine of 1.93.  Likely secondary to hypovolemia.   Creatinine generally improving with IV fluid.   Continue mycophenolate, prednisone, tacrolimus.  Continue sodium bicarb supplement Recent Labs    11/07/22 1528 11/08/22 0136 11/10/22 2217 12/28/22 0842 07/15/23 1932 07/16/23 0425 07/17/23 0310  BUN 23 23 22 18  34* 30* 23  CREATININE 1.43* 1.51* 1.52* 1.58* 1.93* 1.80* 1.89*   Chronic combined systolic and diastolic CHF Hypertension Echocardiogram from February showed EF of 30 to 35%.  Seems to be fairly euvolemic.   Caution with IV fluids.  Lasix and Cozaar on hold due to AKI.  Type 2 diabetes mellitus A1c 6.4 on 07/16/2023 PTA meds- Continue SSI/Accu-Cheks Recent Labs  Lab 07/16/23 2149 07/16/23 2338 07/17/23 0402 07/17/23 0729 07/17/23 1120  GLUCAP 133* 124* 157* 139* 115*   H/o DVT Patient was on Xarelto prior to admission which is currently on hold.   Currently on IV heparin   Mobility: PT eval obtained.  SNF recommended but patient wants to go home rather  Goals of care   Code Status: Full Code     DVT prophylaxis: Heparin drip   Antimicrobials: None Fluid: None Consultants: Vascular surgery Family Communication: None at bedside  Status: Inpatient Level of care:  Progressive   Patient is from: Home Needs to continue in-hospital care: POD 1 Anticipated d/c to: SNF recommended.  Patient wants to go home      Diet:  Diet Order             Diet heart healthy/carb modified Room service appropriate? Yes; Fluid consistency: Thin  Diet effective now                   Scheduled Meds:  insulin aspart  0-6 Units Subcutaneous Q4H   mupirocin ointment   Nasal BID   mycophenolate  720 mg Oral BID   pantoprazole  40 mg Oral q AM   predniSONE  5 mg Oral Daily   pregabalin  75 mg Oral BID   rosuvastatin  5 mg Oral Daily   sodium bicarbonate  1,300 mg Oral BID  tacrolimus  3 mg Oral q morning   And   tacrolimus  2 mg Oral QHS   tamsulosin  0.4 mg Oral QHS    PRN meds: acetaminophen **OR** acetaminophen, albuterol, fentaNYL (SUBLIMAZE) injection, hydrALAZINE, ondansetron **OR** ondansetron (ZOFRAN) IV, oxyCODONE   Infusions:   heparin 1,000 Units/hr (07/17/23 6295)    Antimicrobials: Anti-infectives (From admission, onward)    None       Objective: Vitals:   07/17/23 0741 07/17/23 1119  BP: (!) 165/90 (!) 163/93  Pulse: (!) 114 78  Resp: 17 19  Temp: 98.4 F (36.9 C) 98.6 F (37 C)  SpO2: 92% 100%    Intake/Output Summary (Last 24 hours) at  07/17/2023 1546 Last data filed at 07/17/2023 0404 Gross per 24 hour  Intake 3439.29 ml  Output 2000 ml  Net 1439.29 ml   Filed Weights   07/16/23 0004  Weight: 77.2 kg   Weight change:  Body mass index is 26.66 kg/m.   Physical Exam: General exam: Pleasant, middle-aged African-American male Skin: No rashes, lesions or ulcers. HEENT: Atraumatic, normocephalic, no obvious bleeding Lungs: Clear to auscultation bilaterally CVS: Regular rate and rhythm, no murmur GI/Abd soft, nontender, nondistended, bowel sound present CNS: Alert, awake, oriented x 3 Psychiatry: Mood appropriate Extremities: Right lower extremity postsurgical status  Data Review: I have personally reviewed the laboratory data and studies available.  F/u labs ordered Unresulted Labs (From admission, onward)     Start     Ordered   07/17/23 1700  Heparin level (unfractionated)  Once-Timed,   TIMED        07/17/23 0859   07/17/23 1700  APTT  Once-Timed,   TIMED        07/17/23 0859   07/17/23 0500  Basic metabolic panel  Daily,   R      07/16/23 0946   07/16/23 1052  Surgical pcr screen  Once,   R        07/16/23 1051   07/16/23 0500  APTT  Daily,   R      07/16/23 0010   07/16/23 0500  CBC  Daily,   R      07/16/23 0010   07/15/23 2343  Creatinine, urine, random  Once,   R        07/15/23 2343   07/15/23 2343  Osmolality, urine  Once,   R        07/15/23 2343   07/15/23 2237  Urinalysis, Routine w reflex microscopic -Urine, Clean Catch  ONCE - STAT,   URGENT       Question:  Specimen Source  Answer:  Urine, Clean Catch   07/15/23 2236   07/15/23 2237  Rapid urine drug screen (hospital performed)  ONCE - STAT,   STAT        07/15/23 2236   Signed and Held  Basic metabolic panel  Daily,   R      Signed and Held            Total time spent in review of labs and imaging, patient evaluation, formulation of plan, documentation and communication with family: 45 minutes  Signed, Lorin Glass,  MD [facility] 07/17/2023

## 2023-07-17 NOTE — Evaluation (Signed)
Occupational Therapy Evaluation Patient Details Name: Theodore Arellano MRN: [id number] DOB: 1961-06-14 Today's Date: 07/17/2023   History of Present Illness Pt is a 62 y/o M admitted on 07/15/23 after presenting with c/o RLE pain. Pt is s/p R axillary-femoral, femoral popliteal embolectomy. PMH: HLD, HTN, CKD 3B, renal transplant, PAD, systolic CHF, legally blind in R eye, CAD s/p CABG, gout, cocaine abuse, glaucoma   Clinical Impression   At baseline, pt reports he was completing ADLs and IADLs on his own but was looking for assistance with IADLs and was having difficulty standing in shower and getting out of tub from sitting down in tub. At baseline, pt performs functional mobility with a cane. Pt now presents with decreased activity tolerance, generalized B UE weakness, impaired cognition, decreased balance during functional tasks, and decreased safety and independence with functional tasks. Pt currently demonstrates ability to complete ADLs Independent to Min assist and functional mobility/transfers with a RW with Supervision. Pt will benefit from acute skilled OT services to address deficits outlined below and increase safety and independence with functional tasks. Post acute discharge, pt will benefit from intensive inpatient skilled rehab services < 3 hours per day to maximize rehab potential.       If plan is discharge home, recommend the following: A little help with walking and/or transfers;A little help with bathing/dressing/bathroom;Assistance with cooking/housework;Assist for transportation;Help with stairs or ramp for entrance    Functional Status Assessment  Patient has had a recent decline in their functional status and/or demonstrates limited ability to make significant improvements in function in a reasonable and predictable amount of time  Equipment Recommendations  Other (comment) (defer to next level of care)    Recommendations for Other Services       Precautions /  Restrictions Precautions Precautions: Fall Restrictions Weight Bearing Restrictions: No      Mobility Bed Mobility Overal bed mobility: Needs Assistance             General bed mobility comments: Pt sitting in recliner at beginning and end of session. Per PT note earlier this day, pt required Supervision for bed mobility.    Transfers Overall transfer level: Needs assistance Equipment used: Rolling walker (2 wheels) Transfers: Sit to/from Stand, Bed to chair/wheelchair/BSC Sit to Stand: Supervision     Step pivot transfers: Supervision            Balance Overall balance assessment: Needs assistance Sitting-balance support: No upper extremity supported, Feet supported Sitting balance-Leahy Scale: Fair     Standing balance support: Bilateral upper extremity supported, During functional activity, Reliant on assistive device for balance Standing balance-Leahy Scale: Fair                             ADL either performed or assessed with clinical judgement   ADL Overall ADL's : Needs assistance/impaired Eating/Feeding: Independent;Sitting   Grooming: Independent;Sitting   Upper Body Bathing: Contact guard assist;Sitting   Lower Body Bathing: Minimal assistance;Sitting/lateral leans;Sit to/from stand   Upper Body Dressing : Contact guard assist;Sitting   Lower Body Dressing: Minimal assistance;Sitting/lateral leans;Sit to/from stand   Toilet Transfer: Supervision/safety;BSC/3in1;Rolling walker (2 wheels);Ambulation;Cueing for safety   Toileting- Clothing Manipulation and Hygiene: Contact guard assist;Sitting/lateral lean;Sit to/from stand       Functional mobility during ADLs: Supervision/safety;Rolling walker (2 wheels);Cueing for safety General ADL Comments: Pt with decreased activity tolerance during funcitonal tasks and with pain affecting functional level.     Vision Baseline  Vision/History: 2 Legally blind;0 No visual deficits (Pt reports  he is legally blind in Right eye at baseline and reports no visual deficits in Left eye.) Ability to See in Adequate Light: 1 Impaired (Severely impaired on Right; Adequate on Left) Patient Visual Report: No change from baseline       Perception         Praxis         Pertinent Vitals/Pain Pain Assessment Pain Assessment: 0-10 Pain Score: 8  Pain Location: R foot Pain Descriptors / Indicators: Sore, Aching, Grimacing, Guarding Pain Intervention(s): Limited activity within patient's tolerance, Monitored during session, Repositioned, RN gave pain meds during session     Extremity/Trunk Assessment Upper Extremity Assessment Upper Extremity Assessment: Right hand dominant;Generalized weakness (mild pain in B shoulders with AROM)   Lower Extremity Assessment Lower Extremity Assessment: Defer to PT evaluation   Cervical / Trunk Assessment Cervical / Trunk Assessment: Normal   Communication Communication Communication: No apparent difficulties   Cognition Arousal: Alert Behavior During Therapy: WFL for tasks assessed/performed Overall Cognitive Status: Impaired/Different from baseline (No family/caregiver present to confirm baseline) Area of Impairment: Attention, Safety/judgement, Awareness, Problem solving                   Current Attention Level: Sustained     Safety/Judgement: Decreased awareness of safety, Decreased awareness of deficits Awareness: Emergent Problem Solving: Slow processing, Difficulty sequencing, Requires verbal cues General Comments: AAOx4 and pleasant throughout session. Able to follow 1 step commands consistently with increased time.     General Comments  VSS on RA throughout session. RN presetn during a portion of session.    Exercises     Shoulder Instructions      Home Living Family/patient expects to be discharged to:: Private residence Living Arrangements: Alone Available Help at Discharge: Other (Comment) (Pt reports no  assistance available.) Type of Home: Apartment Home Access: Stairs to enter Entrance Stairs-Number of Steps: 1 Entrance Stairs-Rails: None Home Layout: One level     Bathroom Shower/Tub: Chief Strategy Officer: Standard     Home Equipment: Cane - single point          Prior Functioning/Environment Prior Level of Function : Independent/Modified Independent;Needs assist             Mobility Comments: Pt reports he's ambulatory with cane, denies falls. ADLs Comments: Pt reports he was looking for someone to assist with household chores prior to admission. Pt reports he is completing ADLs with Mod I but is unable to tolerate standing for bathing so instead sits down in tub but has difficulty getting up out of tub.        OT Problem List: Decreased strength;Decreased activity tolerance;Impaired balance (sitting and/or standing);Decreased safety awareness;Decreased cognition;Pain      OT Treatment/Interventions: Self-care/ADL training;Therapeutic exercise;DME and/or AE instruction;Therapeutic activities;Patient/family education;Balance training;Cognitive remediation/compensation    OT Goals(Current goals can be found in the care plan section) Acute Rehab OT Goals Patient Stated Goal: to be able to do more for himself and have less pain OT Goal Formulation: With patient Time For Goal Achievement: 07/31/23 Potential to Achieve Goals: Good ADL Goals Pt Will Perform Grooming: with modified independence;standing Pt Will Perform Lower Body Bathing: sit to/from stand;sitting/lateral leans;with supervision Pt Will Perform Lower Body Dressing: sit to/from stand;sitting/lateral leans;with modified independence Pt Will Transfer to Toilet: with modified independence;regular height toilet;ambulating (with least restrictive AD) Pt Will Perform Toileting - Clothing Manipulation and hygiene: with modified independence;sitting/lateral leans;sit to/from  stand Pt/caregiver will  Perform Home Exercise Program: Increased strength;Both right and left upper extremity;With theraband;With theraputty;With Supervision;With written HEP provided (Increased activity tolerance)  OT Frequency: Min 1X/week    Co-evaluation              AM-PAC OT "6 Clicks" Daily Activity     Outcome Measure Help from another person eating meals?: None Help from another person taking care of personal grooming?: None (in sitting) Help from another person toileting, which includes using toliet, bedpan, or urinal?: A Little Help from another person bathing (including washing, rinsing, drying)?: A Little Help from another person to put on and taking off regular upper body clothing?: A Little Help from another person to put on and taking off regular lower body clothing?: A Little 6 Click Score: 20   End of Session Equipment Utilized During Treatment: Rolling walker (2 wheels) Nurse Communication: Mobility status  Activity Tolerance: Patient tolerated treatment well;Patient limited by pain;Patient limited by fatigue Patient left: in chair;with call bell/phone within reach;with chair alarm set  OT Visit Diagnosis: Unsteadiness on feet (R26.81);Other abnormalities of gait and mobility (R26.89);Muscle weakness (generalized) (M62.81);Other (comment) (Decreased activity tolerance)                Time: 7829-5621 OT Time Calculation (min): 17 min Charges:  OT General Charges $OT Visit: 1 Visit OT Evaluation $OT Eval Low Complexity: 1 Low  Tonni Mansour "Kyle" M., OTR/L, MA Acute Rehab [phone]   Lendon Colonel 07/17/2023, 7:12 PM

## 2023-07-17 NOTE — Progress Notes (Signed)
PHARMACY - ANTICOAGULATION CONSULT NOTE  Pharmacy Consult for IV heparin Indication: critical limb ischemia   Allergies  Allergen Reactions   Percocet [Oxycodone-Acetaminophen] Hives and Itching   Vicodin [Hydrocodone-Acetaminophen] Hives and Itching   Shellfish Allergy Hives   Chocolate Flavor Hives   Chocolate Hives   Tomato Hives    Patient Measurements: Height: 5\' 7"  (170.2 cm) Weight: 77.2 kg (170 lb 3.1 oz) IBW/kg (Calculated) : 66.1 Heparin Dosing Weight: 77.2 kg  Vital Signs: Temp: 98.3 F (36.8 C) (10/16 1641) Temp Source: Oral (10/16 1641) BP: 175/95 (10/16 1641) Pulse Rate: 77 (10/16 1641)  Labs: Recent Labs    07/15/23 1932 07/15/23 1932 07/16/23 0423 07/16/23 0425 07/16/23 1342 07/17/23 0310 07/17/23 1637  HGB 13.2  --  12.4*  --   --  11.5*  --   HCT 41.9  --  38.8*  --   --  35.4*  --   PLT 279  --  241  --   --  195  --   APTT  --    < > 135*  --  79* 37* 56*  HEPARINUNFRC  --   --  0.82*  --   --   --  0.15*  CREATININE 1.93*  --   --  1.80*  --  1.89*  --   CKTOTAL  --   --  70  --   --   --   --    < > = values in this interval not displayed.    Estimated Creatinine Clearance: 37.4 mL/min (A) (by C-G formula based on SCr of 1.89 mg/dL (H)).   Medical History: Past Medical History:  Diagnosis Date   Anxiety    Arthritis    "lower back" (11/28/2017)   CAD (coronary artery disease)    Chronic lower back pain    H/O immunosuppressive therapy    chronic/notes 11/28/2017   History of gout    "before kidney transport" (11/28/2017)   Hyperlipidemia    Hypertension    Kidney disease    s/p kidney transplant 2011; "not on dialysis now" (11/28/2017)   Tobacco use     Assessment: Theodore Arellano is a 62 y.o. year old male admitted on 07/15/2023 with concern for critical limb ischemia. Xarelto 2.5mg  prior to admission (LD 10/14 @ 0830). Pharmacy consulted to dose heparin.  The patient is s/p thrombectomy 10/15. Overnight heparin infusing at low  500 units/hr without issue. Per verbal discussion this morning with PA, the patient is stable to resume full dose heparin. Will resume at the previous rate and monitor closely. No bolus at this time.  10/16 PM Update: HL 0.15, aPTT 56 - both subtherapeutic. No bleeding or issues with the infusion reported per RN.  Goal of Therapy:  Heparin level 0.3-0.7 units/ml aPTT: 66-102 Monitor platelets by anticoagulation protocol: Yes   Plan:  Increase heparin to 1150 units/hr  8 hours  aPTT and heparin level then daily with CBC Monitor for bleeding   Loralee Pacas, PharmD, BCPS Clinical Pharmacist 07/17/2023 7:07 PM Please check AMION for all [facility] numbers   **Pharmacist phone directory can be found on [email or web address] listed under [facility]**

## 2023-07-17 NOTE — Progress Notes (Signed)
  Progress Note    07/17/2023 4:41 PM 1 Day Post-Op  Subjective: Still having some numbness of his right foot but no pain in the right calf  Vitals:   07/17/23 0741 07/17/23 1119  BP: (!) 165/90 (!) 163/93  Pulse: (!) 114 78  Resp: 17 19  Temp: 98.4 F (36.9 C) 98.6 F (37 C)  SpO2: 92% 100%    Physical Exam: Awake alert and oriented Nonlabored respirations Small hematoma right infraclavicular 2+ palpable right common femoral pulse Signal in the popliteal artery on the right with no signals at the ankle   CBC    Component Value Date/Time   WBC 10.3 07/17/2023 0310   RBC 4.08 (L) 07/17/2023 0310   HGB 11.5 (L) 07/17/2023 0310   HCT 35.4 (L) 07/17/2023 0310   PLT 195 07/17/2023 0310   MCV 86.8 07/17/2023 0310   MCH 28.2 07/17/2023 0310   MCHC 32.5 07/17/2023 0310   RDW 16.1 (H) 07/17/2023 0310   LYMPHSABS 1.5 07/16/2023 0423   MONOABS 0.5 07/16/2023 0423   EOSABS 0.2 07/16/2023 0423   BASOSABS 0.0 07/16/2023 0423    BMET    Component Value Date/Time   NA 137 07/17/2023 0310   K 4.3 07/17/2023 0310   CL 107 07/17/2023 0310   CO2 19 (L) 07/17/2023 0310   GLUCOSE 189 (H) 07/17/2023 0310   BUN 23 07/17/2023 0310   CREATININE 1.89 (H) 07/17/2023 0310   CALCIUM 8.2 (L) 07/17/2023 0310   GFRNONAA 39 (L) 07/17/2023 0310   GFRAA >60 01/13/2020 0248    INR    Component Value Date/Time   INR 1.6 (H) 01/12/2020 0656     Intake/Output Summary (Last 24 hours) at 07/17/2023 1642 Last data filed at 07/17/2023 1600 Gross per 24 hour  Intake 3980.59 ml  Output 2000 ml  Net 1980.59 ml     Assessment:  62 y.o. male is s/p:  1.  Reexposure right axillary, right common femoral and right below-knee popliteal arteries greater than 30 days 2.  Thrombectomy of right axillary to profunda bypass and jump graft to below-knee popliteal artery with 3, 4 and 5 Fr Fogarty balloon catheters and thrombectomy right posterior tibial artery from below-knee approach 3.  Redo  right axillary to profunda bypass with 8 mm ringed PTFE 4.  Bovine pericardial patch angioplasty right popliteal artery bypass graft onto the tibioperoneal trunk 1 Day Post-Op  Plan: Right axillary to profunda graft is patent but jump graft to below-knee popliteal is occluded.  Will plan to fully heparinized patient today and will need takeback to the OR for right common femoral to below-knee popliteal bypass and angiography this week.  Unfortunately there is really no other way to study has runoff given the fact that his common femoral artery is disconnected I would not want to stick a new PTFE axillary bypass graft.  I will discuss further intervention with the patient tomorrow.  Ermelinda Eckert C. Randie Heinz, MD [facility] [place] Office: [phone] Pager: [phone]  07/17/2023 4:42 PM

## 2023-07-17 NOTE — Progress Notes (Signed)
PHARMACY - ANTICOAGULATION CONSULT NOTE  Pharmacy Consult for IV heparin Indication: critical limb ischemia   Allergies  Allergen Reactions   Percocet [Oxycodone-Acetaminophen] Hives and Itching   Vicodin [Hydrocodone-Acetaminophen] Hives and Itching   Shellfish Allergy Hives   Chocolate Flavor Hives   Chocolate Hives   Tomato Hives    Patient Measurements: Height: 5\' 7"  (170.2 cm) Weight: 77.2 kg (170 lb 3.1 oz) IBW/kg (Calculated) : 66.1 Heparin Dosing Weight: 77.2 kg  Vital Signs: Temp: 98.4 F (36.9 C) (10/16 0741) Temp Source: Oral (10/16 0741) BP: 165/90 (10/16 0741) Pulse Rate: 114 (10/16 0741)  Labs: Recent Labs    07/15/23 1932 07/16/23 0423 07/16/23 0425 07/16/23 1342 07/17/23 0310  HGB 13.2 12.4*  --   --  11.5*  HCT 41.9 38.8*  --   --  35.4*  PLT 279 241  --   --  195  APTT  --  135*  --  79* 37*  HEPARINUNFRC  --  0.82*  --   --   --   CREATININE 1.93*  --  1.80*  --  1.89*  CKTOTAL  --  70  --   --   --     Estimated Creatinine Clearance: 37.4 mL/min (A) (by C-G formula based on SCr of 1.89 mg/dL (H)).   Medical History: Past Medical History:  Diagnosis Date   Anxiety    Arthritis    "lower back" (11/28/2017)   CAD (coronary artery disease)    Chronic lower back pain    H/O immunosuppressive therapy    chronic/notes 11/28/2017   History of gout    "before kidney transport" (11/28/2017)   Hyperlipidemia    Hypertension    Kidney disease    s/p kidney transplant 2011; "not on dialysis now" (11/28/2017)   Tobacco use     Assessment: Theodore Arellano is a 62 y.o. year old male admitted on 07/15/2023 with concern for critical limb ischemia. Xarelto 2.5mg  prior to admission (LD 10/14 @ 0830). Pharmacy consulted to dose heparin.  The patient is s/p thrombectomy 10/15. Overnight heparin infusing at low 500 units/hr without issue. Per verbal discussion this morning with PA, the patient is stable to resume full dose heparin. Will resume at the  previous rate and monitor closely. No bolus at this time.  Goal of Therapy:  Heparin level 0.3-0.7 units/ml aPTT: 66-102 Monitor platelets by anticoagulation protocol: Yes   Plan:  Increase heparin to 1000 units/hr  8 hours  aPTT and heparin level then daily with CBC Monitor for bleeding F/u plans for anticoagulation post-op   Ruben Im, PharmD Clinical Pharmacist 07/17/2023 8:50 AM Please check AMION for all [facility] numbers   **Pharmacist phone directory can be found on [email or web address] listed under [facility]**

## 2023-07-18 DIAGNOSIS — I998 Other disorder of circulatory system: Secondary | ICD-10-CM | POA: Diagnosis not present

## 2023-07-18 LAB — CBC
HCT: 29.4 % — ABNORMAL LOW (ref 39.0–52.0)
Hemoglobin: 9.6 g/dL — ABNORMAL LOW (ref 13.0–17.0)
MCH: 28.9 pg (ref 26.0–34.0)
MCHC: 32.7 g/dL (ref 30.0–36.0)
MCV: 88.6 fL (ref 80.0–100.0)
Platelets: 153 10*3/uL (ref 150–400)
RBC: 3.32 MIL/uL — ABNORMAL LOW (ref 4.22–5.81)
RDW: 16.4 % — ABNORMAL HIGH (ref 11.5–15.5)
WBC: 8.9 10*3/uL (ref 4.0–10.5)
nRBC: 0 % (ref 0.0–0.2)

## 2023-07-18 LAB — BASIC METABOLIC PANEL
Anion gap: 10 (ref 5–15)
BUN: 33 mg/dL — ABNORMAL HIGH (ref 8–23)
CO2: 21 mmol/L — ABNORMAL LOW (ref 22–32)
Calcium: 8.7 mg/dL — ABNORMAL LOW (ref 8.9–10.3)
Chloride: 108 mmol/L (ref 98–111)
Creatinine, Ser: 2.01 mg/dL — ABNORMAL HIGH (ref 0.61–1.24)
GFR, Estimated: 37 mL/min — ABNORMAL LOW (ref >60)
Glucose, Bld: 100 mg/dL — ABNORMAL HIGH (ref 70–99)
Potassium: 4 mmol/L (ref 3.5–5.1)
Sodium: 139 mmol/L (ref 135–145)

## 2023-07-18 LAB — GLUCOSE, CAPILLARY
Glucose-Capillary: 114 mg/dL — ABNORMAL HIGH (ref 70–99)
Glucose-Capillary: 106 mg/dL — ABNORMAL HIGH (ref 70–99)
Glucose-Capillary: 116 mg/dL — ABNORMAL HIGH (ref 70–99)
Glucose-Capillary: 101 mg/dL — ABNORMAL HIGH (ref 70–99)
Glucose-Capillary: 122 mg/dL — ABNORMAL HIGH (ref 70–99)

## 2023-07-18 LAB — APTT: aPTT: 70 s — ABNORMAL HIGH (ref 24–36)

## 2023-07-18 NOTE — Progress Notes (Signed)
PHARMACY - ANTICOAGULATION CONSULT NOTE  Pharmacy Consult for IV heparin Indication: critical limb ischemia   Allergies  Allergen Reactions   Percocet [Oxycodone-Acetaminophen] Hives and Itching   Vicodin [Hydrocodone-Acetaminophen] Hives and Itching   Shellfish Allergy Hives   Chocolate Flavor Hives   Chocolate Hives   Tomato Hives    Patient Measurements: Height: 5\' 7"  (170.2 cm) Weight: 77.2 kg (170 lb 3.1 oz) IBW/kg (Calculated) : 66.1 Heparin Dosing Weight: 77.2 kg  Vital Signs: Temp: 98.3 F (36.8 C) (10/16 2311) Temp Source: Oral (10/16 2311) BP: 147/80 (10/16 2311) Pulse Rate: 88 (10/16 2311)  Labs: Recent Labs    07/16/23 0423 07/16/23 0425 07/16/23 1342 07/17/23 0310 07/17/23 1637 07/18/23 0325  HGB 12.4*  --   --  11.5*  --  9.6*  HCT 38.8*  --   --  35.4*  --  29.4*  PLT 241  --   --  195  --  153  APTT 135*  --    < > 37* 56* 70*  HEPARINUNFRC 0.82*  --   --   --  0.15*  --   CREATININE  --  1.80*  --  1.89*  --  2.01*  CKTOTAL 70  --   --   --   --   --    < > = values in this interval not displayed.    Estimated Creatinine Clearance: 35.2 mL/min (A) (by C-G formula based on SCr of 2.01 mg/dL (H)).   Medical History: Past Medical History:  Diagnosis Date   Anxiety    Arthritis    "lower back" (11/28/2017)   CAD (coronary artery disease)    Chronic lower back pain    H/O immunosuppressive therapy    chronic/notes 11/28/2017   History of gout    "before kidney transport" (11/28/2017)   Hyperlipidemia    Hypertension    Kidney disease    s/p kidney transplant 2011; "not on dialysis now" (11/28/2017)   Tobacco use     Assessment: NASIRE REALI is a 62 y.o. year old male admitted on 07/15/2023 with concern for critical limb ischemia. Xarelto 2.5mg  prior to admission (LD 10/14 @ 0830). Pharmacy consulted to dose heparin.  The patient is s/p thrombectomy 10/15. Overnight heparin infusing at low 500 units/hr without issue. Per verbal  discussion this morning with PA, the patient is stable to resume full dose heparin. Will resume at the previous rate and monitor closely. No bolus at this time.  aPTT is therapeutic this morning at 70 seconds, no heparin level drawn.   Goal of Therapy:  Heparin level 0.3-0.7 units/ml aPTT: 66-102 Monitor platelets by anticoagulation protocol: Yes   Plan:  Continue heparin 1150 units/h Daily aPTT, heparin level, CBC  Fredonia Highland, PharmD, BCPS, [facility] Clinical Pharmacist Please check AMION for all [facility] numbers 07/18/2023

## 2023-07-18 NOTE — Progress Notes (Signed)
Mobility Specialist Progress Note:    07/18/23 1226  Mobility  Activity Transferred from bed to chair  Level of Assistance Minimal assist, patient does 75% or more  Assistive Device Front wheel walker  Distance Ambulated (ft) 3 ft  Activity Response Tolerated well  Mobility Referral Yes  $Mobility charge 1 Mobility  Mobility Specialist Start Time (ACUTE ONLY) 1215  Mobility Specialist Stop Time (ACUTE ONLY) 1226  Mobility Specialist Time Calculation (min) (ACUTE ONLY) 11 min   Pt received in bed, refused ambulation d/t shoulder pain and fatigue. Agreeable to transfer B>C with RW. Required MinA to stand, CGA to transfer. Tolerated well, asx throughout. Left pt in chair, call bell in reach. All needs met.   Feliciana Rossetti Mobility Specialist Please contact via Special educational needs teacher or  Rehab office at [phone]

## 2023-07-18 NOTE — Progress Notes (Signed)
Pt threw a 10 beat run of PVCs per tele tech this evening. Discusssed with Oncall MD (Opyd) and we will check his magnesium in the am.

## 2023-07-18 NOTE — Progress Notes (Addendum)
  Progress Note    07/18/2023 10:32 AM 2 Days Post-Op  Subjective:  says right foot feels cold and some pain in the toes but denies any pain in foot or leg   Vitals:   07/18/23 0628 07/18/23 0842  BP: 139/71 (!) 156/105  Pulse: 81 72  Resp:    Temp: 98.4 F (36.9 C) 98.2 F (36.8 C)  SpO2: 100%    Physical Exam: Cardiac:  regular Lungs:  non labored Incisions:  Right infraclavicular incision is intact and well appearing, right groin incision is intact and well appearing without swelling or hematoma Extremities:  Palpable femoral pulse. No distal pulses palpable in right foot. Motor and sensation intact Abdomen:  soft Neurologic: alert and oriented  CBC    Component Value Date/Time   WBC 8.9 07/18/2023 0325   RBC 3.32 (L) 07/18/2023 0325   HGB 9.6 (L) 07/18/2023 0325   HCT 29.4 (L) 07/18/2023 0325   PLT 153 07/18/2023 0325   MCV 88.6 07/18/2023 0325   MCH 28.9 07/18/2023 0325   MCHC 32.7 07/18/2023 0325   RDW 16.4 (H) 07/18/2023 0325   LYMPHSABS 1.5 07/16/2023 0423   MONOABS 0.5 07/16/2023 0423   EOSABS 0.2 07/16/2023 0423   BASOSABS 0.0 07/16/2023 0423    BMET    Component Value Date/Time   NA 139 07/18/2023 0325   K 4.0 07/18/2023 0325   CL 108 07/18/2023 0325   CO2 21 (L) 07/18/2023 0325   GLUCOSE 100 (H) 07/18/2023 0325   BUN 33 (H) 07/18/2023 0325   CREATININE 2.01 (H) 07/18/2023 0325   CALCIUM 8.7 (L) 07/18/2023 0325   GFRNONAA 37 (L) 07/18/2023 0325   GFRAA >60 01/13/2020 0248    INR    Component Value Date/Time   INR 1.6 (H) 01/12/2020 0656     Intake/Output Summary (Last 24 hours) at 07/18/2023 1032 Last data filed at 07/18/2023 0600 Gross per 24 hour  Intake 360.33 ml  Output 1550 ml  Net -1189.67 ml     Assessment/Plan:  62 y.o. male is s/p  1.  Reexposure right axillary, right common femoral and right below-knee popliteal arteries greater than 30 days 2.  Thrombectomy of right axillary to profunda bypass and jump graft to  below-knee popliteal artery with 3, 4 and 5 Fr Fogarty balloon catheters and thrombectomy right posterior tibial artery from below-knee approach 3.  Redo right axillary to profunda bypass with 8 mm ringed PTFE 4.  Bovine pericardial patch angioplasty right popliteal artery bypass graft onto the tibioperoneal trunk   Right ax-profunda bypass patent but right profunda to below knee popliteal bypass occluded Continue Heparin gtt Plan will be for take back to OR tomorrow for right CF to below knee popliteal bypass and angiogram with Dr. Randie Heinz Will try to limit contrast as Creatine is trending up. Would try to hydrate overnight NPO after midnight Consent order placed   DVT prophylaxis:  heparin gtt   Graceann Congress, PA-C [facility] [phone] 07/18/2023 10:32 AM  I have independently interviewed and examined patient and agree with PA assessment and plan above.   Tinna Kolker C. Randie Heinz, MD [facility] [place] Office: [phone] Pager: [phone]

## 2023-07-18 NOTE — TOC Progression Note (Signed)
Transition of Care [facility]) - Progression Note    Patient Details  Name: Theodore Arellano MRN: [id number] Date of Birth: 1961-02-10  Transition of Care [facility]) CM/SW Contact  Eduard Roux, [place] Phone Number: 07/18/2023, 1:45 PM  Clinical Narrative:     CSW met with patient at bedside. CSW introduced self and explained role. CSW discussed with patient recommendations for short tern rehab at [facility]. Patient states he lives home alone and is agreeable to rehab at [facility]. CSW explained the SNF process. No preferred SNF at this time but wants a SNF close to his home. Previously, at  [facility]. All questions answered.   TOC will provide bed offers once available.   Antony Blackbird, MSW, LCSW Clinical Social Worker     Expected Discharge Plan: Skilled Nursing Facility Barriers to Discharge: Continued Medical Work up  Expected Discharge Plan and Services In-house Referral: Clinical Social Work     Living arrangements for the past 2 months: Apartment                                       Social Determinants of Health (SDOH) Interventions SDOH Screenings   Food Insecurity: No Food Insecurity (07/16/2023)  Housing: Low Risk  (07/16/2023)  Transportation Needs: Unmet Transportation Needs (07/16/2023)  Utilities: Not At Risk (07/16/2023)  Tobacco Use: High Risk (07/16/2023)    Readmission Risk Interventions     No data to display

## 2023-07-18 NOTE — Progress Notes (Signed)
Mobility Specialist Progress Note:    07/18/23 1257  Mobility  Activity Transferred from chair to bed  Level of Assistance Minimal assist, patient does 75% or more  Assistive Device Front wheel walker  Distance Ambulated (ft) 3 ft  Activity Response Tolerated well  Mobility Referral Yes  $Mobility charge 1 Mobility  Mobility Specialist Start Time (ACUTE ONLY) 1257  Mobility Specialist Stop Time (ACUTE ONLY) 1300  Mobility Specialist Time Calculation (min) (ACUTE ONLY) 3 min   Pt received in chair, requesting assistance to transfer back to bed. Required MinA to stand with RW. Tolerated well, asx throughout. All needs met, alarm on, call bell in reach.   Feliciana Rossetti Mobility Specialist Please contact via Special educational needs teacher or  Rehab office at [phone]

## 2023-07-18 NOTE — Progress Notes (Signed)
PROGRESS NOTE  Theodore Arellano  DOB: May 16, 1961  PCP: [name], MD [id number]  DOA: 07/15/2023  LOS: 3 days  Hospital Day: 4  Brief narrative: Theodore Arellano is a 62 y.o. male with PMH significant for DM2, HTN, HLD, CAD/CABG, systolic CHF, CKD stage 3b h/o renal transplant,  PAD with h/o critical lower limb ischemia in 2022, legally blind in right eye,  h/o DVT on xarelto, h/o cocaine abuse, GERD, gout, glaucoma.   10/14, patient presented with pain in the right lower extremity ongoing for about 3 weeks.  Concern was for vascular compromise.  Patient was hospitalized for further management.  Vascular surgery was consulted.   Subjective: Patient was seen and examined this morning. Middle-aged African-American male. Lying on bed.  Not in distress. He is distressed with the possibility of another surgery.  Assessment and plan: Acute on chronic ischemia right lower extremity S/p thrombectomy, angioplasty and bypass -10/15 Dr. Randie Heinz Currently on heparin drip. Noted a plan from vascular surgery to take him back to OR tomorrow for right common femoral to below-knee popliteal bypass and angiogram. Continue Crestor   H/o CAD/CABG No anginal symptoms currently.     AKI on CKD 3B Chronic metabolic acidosis H/o renal transplant Baseline creatinine seems to be around 1.5.   Came in with creatinine of 1.93.  Likely secondary to hypovolemia.   Creatinine generally improving with IV fluid.  Noted plan from vascular surgery to continue IV fluid tonight in anticipation of angiogram tomorrow. Continue mycophenolate, prednisone, tacrolimus.  Continue sodium bicarb supplement Recent Labs    11/07/22 1528 11/08/22 0136 11/10/22 2217 12/28/22 0842 07/15/23 1932 07/16/23 0425 07/17/23 0310 07/18/23 0325  BUN 23 23 22 18  34* 30* 23 33*  CREATININE 1.43* 1.51* 1.52* 1.58* 1.93* 1.80* 1.89* 2.01*   Chronic combined systolic and diastolic CHF Hypertension Echocardiogram from  February showed EF of 30 to 35%.  Seems to be fairly euvolemic.  Caution with IV fluids.  Lasix and Cozaar on hold due to AKI.  Type 2 diabetes mellitus A1c 6.4 on 07/16/2023 PTA meds- Continue SSI/Accu-Cheks Recent Labs  Lab 07/17/23 2309 07/18/23 0429 07/18/23 0843 07/18/23 1211 07/18/23 1634  GLUCAP 116* 114* 106* 116* 101*   H/o DVT Patient was on Xarelto prior to admission which is currently on hold.   Currently on IV heparin   Mobility: PT eval obtained.  SNF recommended but patient wants to go home rather  Goals of care   Code Status: Full Code     DVT prophylaxis: Heparin drip   Antimicrobials: None Fluid: None Consultants: Vascular surgery Family Communication: None at bedside  Status: Inpatient Level of care:  Progressive   Patient is from: Home Needs to continue in-hospital care: Pending repeat surgery tomorrow. Anticipated d/c to: SNF recommended.  Patient wants to go home   Diet:  Diet Order             Diet NPO time specified  Diet effective midnight           Diet heart healthy/carb modified Room service appropriate? Yes; Fluid consistency: Thin  Diet effective now                   Scheduled Meds:  insulin aspart  0-6 Units Subcutaneous Q4H   mupirocin ointment   Nasal BID   mycophenolate  720 mg Oral BID   pantoprazole  40 mg Oral q AM   predniSONE  5 mg Oral Daily   pregabalin  75 mg Oral BID   rosuvastatin  5 mg Oral Daily   sodium bicarbonate  1,300 mg Oral BID   tacrolimus  3 mg Oral q morning   And   tacrolimus  2 mg Oral QHS   tamsulosin  0.4 mg Oral QHS    PRN meds: acetaminophen **OR** acetaminophen, albuterol, fentaNYL (SUBLIMAZE) injection, hydrALAZINE, ondansetron **OR** ondansetron (ZOFRAN) IV, oxyCODONE   Infusions:   heparin 1,150 Units/hr (07/18/23 0306)    Antimicrobials: Anti-infectives (From admission, onward)    None       Objective: Vitals:   07/18/23 1212 07/18/23 1632  BP: (!) 174/96 (!)  167/91  Pulse: 72 77  Resp: 18 16  Temp: 98.9 F (37.2 C) 98.6 F (37 C)  SpO2: 91% 100%    Intake/Output Summary (Last 24 hours) at 07/18/2023 1643 Last data filed at 07/18/2023 1636 Gross per 24 hour  Intake 59.03 ml  Output 1750 ml  Net -1690.97 ml   Filed Weights   07/16/23 0004  Weight: 77.2 kg   Weight change:  Body mass index is 26.66 kg/m.   Physical Exam: General exam: Pleasant, middle-aged African-American male Skin: No rashes, lesions or ulcers. HEENT: Atraumatic, normocephalic, no obvious bleeding Lungs: Clear to auscultation bilaterally CVS: Regular rate and rhythm, no murmur GI/Abd soft, nontender, nondistended, bowel sound present CNS: Alert, awake, oriented x 3 Psychiatry: Mood appropriate Extremities: Right lower extremity postsurgical status  Data Review: I have personally reviewed the laboratory data and studies available.  F/u labs ordered Unresulted Labs (From admission, onward)     Start     Ordered   07/19/23 0500  Heparin level (unfractionated)  Daily,   R     Question:  Specimen collection method  Answer:  Lab=Lab collect   07/18/23 0410   07/17/23 0500  Basic metabolic panel  Daily,   R      07/16/23 0946   07/16/23 1052  Surgical pcr screen  Once,   R        07/16/23 1051   07/16/23 0500  CBC  Daily,   R      07/16/23 0010   07/15/23 2343  Creatinine, urine, random  Once,   R        07/15/23 2343   07/15/23 2343  Osmolality, urine  Once,   R        07/15/23 2343   07/15/23 2237  Urinalysis, Routine w reflex microscopic -Urine, Clean Catch  ONCE - STAT,   URGENT       Question:  Specimen Source  Answer:  Urine, Clean Catch   07/15/23 2236   07/15/23 2237  Rapid urine drug screen (hospital performed)  ONCE - STAT,   STAT        07/15/23 2236   Signed and Held  Basic metabolic panel  Daily,   R      Signed and Held            Total time spent in review of labs and imaging, patient evaluation, formulation of plan,  documentation and communication with family: 45 minutes  Signed, Lorin Glass, MD [facility] 07/18/2023

## 2023-07-18 NOTE — Anesthesia Postprocedure Evaluation (Signed)
Anesthesia Post Note  Patient: Theodore Arellano  Procedure(s) Performed: REDO RIGHT AXILLARY-FEMORAL, Modena Nunnery WITH PATCH ANGIOPLSTY RIGHT POPLITEAL ARTERY. (Right: Axilla) EMBOLECTOMY RIGHT Axilliofemoral and femoral/popliteal bypass graft. (Axilla)     Patient location during evaluation: PACU Anesthesia Type: General Level of consciousness: awake and patient cooperative Pain management: pain level controlled Vital Signs Assessment: post-procedure vital signs reviewed and stable Respiratory status: spontaneous breathing, nonlabored ventilation, respiratory function stable and patient connected to nasal cannula oxygen Cardiovascular status: blood pressure returned to baseline and stable Postop Assessment: no apparent nausea or vomiting Anesthetic complications: no   No notable events documented.               Javontae Marlette

## 2023-07-18 NOTE — [place] FL2 (Addendum)
[place] MEDICAID FL2 LEVEL OF CARE FORM     IDENTIFICATION  Patient Name: Theodore Arellano Birthdate: [birth year] Sex: male Admission Date (Current Location): 07/15/2023  [facility] and [place] Number:  Producer, television/film/video and Address:  [facility] [place], [address] [place], [place] [address]      Provider Number: [id number]  Attending Physician Name and Address:  Lorin Glass, MD  Relative Name and Phone Number:       Current Level of Care: Hospital Recommended Level of Care: Skilled Nursing Facility Prior Approval Number:    Date Approved/Denied:   PASRR Number:  [id number]  Discharge Plan: SNF    Current Diagnoses: Patient Active Problem List   Diagnosis Date Noted   Vascular insufficiency of extremity 07/15/2023   Bradycardia 11/08/2022   HTN (hypertension) 11/08/2022   Hypomagnesemia 11/08/2022   Chronic systolic CHF (congestive heart failure) (HCC) 11/08/2022   Impaired functional mobility, balance, gait, and endurance 02/07/2021   Hypertension    Critical lower limb ischemia (HCC) 01/05/2021   Anticoagulant long-term use    PAD (peripheral artery disease) (HCC)    Leg pain, anterior, right 12/27/2020   Non-healing wound of right lower extremity 12/27/2020   Cellulitis of right lower extremity 12/27/2020   Arterial leg ulcer (HCC) 12/27/2020   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   Osteomyelitis of left fibula (HCC) 01/13/2020   Cellulitis of left lower extremity 01/11/2020   Sepsis (HCC) 01/11/2020   DM (diabetes mellitus) type II, controlled, with peripheral vascular disorder (HCC) 01/11/2020   Chronic pain syndrome 01/11/2020   Sepsis due to cellulitis (HCC) 01/11/2020   ESRD (end stage renal disease) (HCC) 01/06/2020   Acute kidney injury (HCC) 11/08/2019   COVID-19 virus infection 11/08/2019   History of DVT (deep vein thrombosis) 11/08/2019   Hx of CABG 09/04/2019   Disorder of arteries and arterioles,  unspecified (HCC) 07/21/2019   Wound of left leg 07/21/2019   Left leg pain 06/28/2019   Abnormal ECG 06/05/2019   Mobitz type I Wenckebach atrioventricular block 06/05/2019   Unstable angina pectoris due to coronary arteriosclerosis (HCC) 05/30/2019   Left ventricular dysfunction 01/08/2018   Coronary artery disease    Coronary artery disease due to lipid rich plaque    Elevated troponin    Tobacco abuse 09/02/2017   Chest pain 08/27/2016   Renal transplant recipient 08/27/2016   Essential hypertension 08/27/2016   HLD (hyperlipidemia) 08/27/2016   Type 2 diabetes mellitus with vascular disease (HCC) 08/27/2016   Abnormal nuclear stress test 11/02/2015   CKD (chronic kidney disease) 11/02/2015   Current smoker 11/02/2015   Dyslipidemia 11/02/2015   Immunosuppression (HCC) 09/02/2014   Rash 09/02/2014   Left groin pain 06/24/2013   Anemia 05/21/2012   Leucopenia 05/21/2012   Personal history of immunosupression therapy 05/21/2012    Orientation RESPIRATION BLADDER Height & Weight     Self, Time, Situation, Place  Normal Continent Weight: 170 lb 3.1 oz (77.2 kg) Height:  5\' 7"  (170.2 cm)  BEHAVIORAL SYMPTOMS/MOOD NEUROLOGICAL BOWEL NUTRITION STATUS      Continent Diet (please see discharge summary)  AMBULATORY STATUS COMMUNICATION OF NEEDS Skin   Limited Assist Verbally Skin abrasions (closed incision  RT Axilla, closed incsion Right Groin, closed incision Right Leg)                       Personal Care Assistance Level of Assistance  Bathing, Feeding, Dressing Bathing Assistance: Limited assistance  Feeding assistance: Independent Dressing Assistance: Limited assistance     Functional Limitations Info  Sight, Hearing, Speech Sight Info: Adequate Hearing Info: Adequate Speech Info: Adequate    SPECIAL CARE FACTORS FREQUENCY  PT (By licensed PT), OT (By licensed OT)     PT Frequency: 5x per week OT Frequency: 5x per week            Contractures  Contractures Info: Not present    Additional Factors Info  Code Status, Allergies Code Status Info: FULL Allergies Info: Percocet,Vicodin,Shellfish Allergy, chocolate favor,tomato           Current Medications (07/18/2023):  This is the current hospital active medication list Current Facility-Administered Medications  Medication Dose Route Frequency Provider Last Rate Last Admin   acetaminophen (TYLENOL) tablet 650 mg  650 mg Oral Q6H PRN Clinton Gallant M, PA-C       Or   acetaminophen (TYLENOL) suppository 650 mg  650 mg Rectal Q6H PRN Clinton Gallant M, PA-C       albuterol (PROVENTIL) (2.5 MG/3ML) 0.083% nebulizer solution 2.5 mg  2.5 mg Nebulization Q2H PRN Clinton Gallant M, PA-C       fentaNYL (SUBLIMAZE) injection 12.5-50 mcg  12.5-50 mcg Intravenous Q2H PRN Clinton Gallant M, PA-C   50 mcg at 07/18/23 0314   heparin ADULT infusion 100 units/mL (25000 units/250mL)  1,150 Units/hr Intravenous Continuous Rollene Fare, RPH 11.5 mL/hr at 07/18/23 0306 1,150 Units/hr at 07/18/23 0306   hydrALAZINE (APRESOLINE) injection 10 mg  10 mg Intravenous Q8H PRN Lars Mage, PA-C   10 mg at 07/17/23 2218   insulin aspart (novoLOG) injection 0-6 Units  0-6 Units Subcutaneous Q4H Lars Mage, [place]   1 Units at 07/17/23 0410   mupirocin ointment (BACTROBAN) 2 %   Nasal BID Osvaldo Shipper, MD   Given at 07/18/23 0854   mycophenolate (MYFORTIC) EC tablet 720 mg  720 mg Oral BID Lars Mage, PA-C   720 mg at 07/18/23 0853   ondansetron (ZOFRAN) tablet 4 mg  4 mg Oral Q6H PRN Clinton Gallant M, PA-C       Or   ondansetron [facility]) injection 4 mg  4 mg Intravenous Q6H PRN Clinton Gallant M, PA-C       oxyCODONE (Oxy IR/ROXICODONE) immediate release tablet 5-10 mg  5-10 mg Oral Q4H PRN Clinton Gallant M, PA-C   5 mg at 07/17/23 1203   pantoprazole (PROTONIX) EC tablet 40 mg  40 mg Oral q AM Lars Mage, PA-C   40 mg at 07/18/23 1324   predniSONE (DELTASONE) tablet 5 mg  5 mg Oral Daily Clinton Gallant M, PA-C   5 mg at 07/18/23 0850   pregabalin (LYRICA) capsule 75 mg  75 mg Oral BID Clinton Gallant M, PA-C   75 mg at 07/18/23 0900   rosuvastatin (CRESTOR) tablet 5 mg  5 mg Oral Daily Clinton Gallant M, PA-C   5 mg at 07/18/23 4010   sodium bicarbonate tablet 1,300 mg  1,300 mg Oral BID Lars Mage, PA-C   1,300 mg at 07/18/23 0850   tacrolimus (PROGRAF) capsule 3 mg  3 mg Oral q morning Clinton Gallant M, PA-C   3 mg at 07/18/23 2725   And   tacrolimus (PROGRAF) capsule 2 mg  2 mg Oral QHS Clinton Gallant M, PA-C   2 mg at 07/17/23 2159   tamsulosin (FLOMAX) capsule 0.4 mg  0.4 mg Oral QHS Lars Mage, PA-C  0.4 mg at 07/17/23 2200     Discharge Medications: Please see discharge summary for a list of discharge medications.  Relevant Imaging Results:  Relevant Lab Results:   Additional Information SSN [id number]  Eduard Roux, LCSW

## 2023-07-18 NOTE — Care Management Important Message (Signed)
Important Message  Patient Details  Name: Theodore Arellano MRN: [id number] Date of Birth: Dec 17, 1960   Important Message Given:  Yes - Medicare IM     Sherilyn Banker 07/18/2023, 2:31 PM

## 2023-07-19 ENCOUNTER — Inpatient Hospital Stay (HOSPITAL_COMMUNITY): Payer: 59 | Admitting: Certified Registered Nurse Anesthetist

## 2023-07-19 ENCOUNTER — Encounter (HOSPITAL_COMMUNITY): Payer: Self-pay | Admitting: Internal Medicine

## 2023-07-19 ENCOUNTER — Inpatient Hospital Stay (HOSPITAL_COMMUNITY): Payer: 59

## 2023-07-19 ENCOUNTER — Other Ambulatory Visit: Payer: Self-pay

## 2023-07-19 ENCOUNTER — Encounter (HOSPITAL_COMMUNITY)
Admission: EM | Disposition: A | Discharge status: Skilled Nursing Facility | Payer: Self-pay | Source: Home / Self Care | Attending: Internal Medicine

## 2023-07-19 DIAGNOSIS — T82898A Other specified complication of vascular prosthetic devices, implants and grafts, initial encounter: Secondary | ICD-10-CM

## 2023-07-19 DIAGNOSIS — I2510 Atherosclerotic heart disease of native coronary artery without angina pectoris: Secondary | ICD-10-CM

## 2023-07-19 DIAGNOSIS — I743 Embolism and thrombosis of arteries of the lower extremities: Secondary | ICD-10-CM

## 2023-07-19 DIAGNOSIS — F17210 Nicotine dependence, cigarettes, uncomplicated: Secondary | ICD-10-CM

## 2023-07-19 DIAGNOSIS — I998 Other disorder of circulatory system: Secondary | ICD-10-CM | POA: Diagnosis not present

## 2023-07-19 HISTORY — PX: FEMORAL-POPLITEAL BYPASS GRAFT: SHX937

## 2023-07-19 HISTORY — PX: PATCH ANGIOPLASTY: SHX6230

## 2023-07-19 HISTORY — PX: LOWER EXTREMITY ANGIOGRAM: SHX5508

## 2023-07-19 HISTORY — PX: THROMBECTOMY OF BYPASS GRAFT FEMORAL- POPLITEAL ARTERY: SHX6902

## 2023-07-19 LAB — GLUCOSE, CAPILLARY
Glucose-Capillary: 100 mg/dL — ABNORMAL HIGH (ref 70–99)
Glucose-Capillary: 102 mg/dL — ABNORMAL HIGH (ref 70–99)
Glucose-Capillary: 126 mg/dL — ABNORMAL HIGH (ref 70–99)
Glucose-Capillary: 137 mg/dL — ABNORMAL HIGH (ref 70–99)
Glucose-Capillary: 155 mg/dL — ABNORMAL HIGH (ref 70–99)
Glucose-Capillary: 159 mg/dL — ABNORMAL HIGH (ref 70–99)

## 2023-07-19 LAB — POCT I-STAT 7, (LYTES, BLD GAS, ICA,H+H)
Acid-base deficit: 3 mmol/L — ABNORMAL HIGH (ref 0.0–2.0)
Acid-base deficit: 6 mmol/L — ABNORMAL HIGH (ref 0.0–2.0)
Bicarbonate: 22.2 mmol/L (ref 20.0–28.0)
Bicarbonate: 20.4 mmol/L (ref 20.0–28.0)
Calcium, Ion: 1.17 mmol/L (ref 1.15–1.40)
Calcium, Ion: 0.98 mmol/L — ABNORMAL LOW (ref 1.15–1.40)
HCT: 33 % — ABNORMAL LOW (ref 39.0–52.0)
HCT: 28 % — ABNORMAL LOW (ref 39.0–52.0)
Hemoglobin: 11.2 g/dL — ABNORMAL LOW (ref 13.0–17.0)
Hemoglobin: 9.5 g/dL — ABNORMAL LOW (ref 13.0–17.0)
O2 Saturation: 100 %
O2 Saturation: 100 %
Patient temperature: 36
Patient temperature: 36.4
Potassium: 4.4 mmol/L (ref 3.5–5.1)
Potassium: 4.6 mmol/L (ref 3.5–5.1)
Sodium: 141 mmol/L (ref 135–145)
Sodium: 138 mmol/L (ref 135–145)
TCO2: 23 mmol/L (ref 22–32)
TCO2: 22 mmol/L (ref 22–32)
pCO2 arterial: 38.8 mm[Hg] (ref 32–48)
pCO2 arterial: 41.6 mm[Hg] (ref 32–48)
pH, Arterial: 7.36 (ref 7.35–7.45)
pH, Arterial: 7.296 — ABNORMAL LOW (ref 7.35–7.45)
pO2, Arterial: 281 mm[Hg] — ABNORMAL HIGH (ref 83–108)
pO2, Arterial: 261 mm[Hg] — ABNORMAL HIGH (ref 83–108)

## 2023-07-19 LAB — BASIC METABOLIC PANEL
Anion gap: 12 (ref 5–15)
BUN: 30 mg/dL — ABNORMAL HIGH (ref 8–23)
CO2: 21 mmol/L — ABNORMAL LOW (ref 22–32)
Calcium: 9.1 mg/dL (ref 8.9–10.3)
Chloride: 105 mmol/L (ref 98–111)
Creatinine, Ser: 1.89 mg/dL — ABNORMAL HIGH (ref 0.61–1.24)
GFR, Estimated: 39 mL/min — ABNORMAL LOW (ref >60)
Glucose, Bld: 96 mg/dL (ref 70–99)
Potassium: 4.3 mmol/L (ref 3.5–5.1)
Sodium: 138 mmol/L (ref 135–145)

## 2023-07-19 LAB — CBC
HCT: 28.1 % — ABNORMAL LOW (ref 39.0–52.0)
HCT: 31 % — ABNORMAL LOW (ref 39.0–52.0)
Hemoglobin: 9.2 g/dL — ABNORMAL LOW (ref 13.0–17.0)
Hemoglobin: 9.9 g/dL — ABNORMAL LOW (ref 13.0–17.0)
MCH: 28.5 pg (ref 26.0–34.0)
MCH: 28.4 pg (ref 26.0–34.0)
MCHC: 32.7 g/dL (ref 30.0–36.0)
MCHC: 31.9 g/dL (ref 30.0–36.0)
MCV: 87 fL (ref 80.0–100.0)
MCV: 88.8 fL (ref 80.0–100.0)
Platelets: 153 10*3/uL (ref 150–400)
Platelets: 137 10*3/uL — ABNORMAL LOW (ref 150–400)
RBC: 3.23 MIL/uL — ABNORMAL LOW (ref 4.22–5.81)
RBC: 3.49 MIL/uL — ABNORMAL LOW (ref 4.22–5.81)
RDW: 16.2 % — ABNORMAL HIGH (ref 11.5–15.5)
RDW: 16.2 % — ABNORMAL HIGH (ref 11.5–15.5)
WBC: 8.9 10*3/uL (ref 4.0–10.5)
WBC: 8.1 10*3/uL (ref 4.0–10.5)
nRBC: 0 % (ref 0.0–0.2)
nRBC: 0 % (ref 0.0–0.2)

## 2023-07-19 LAB — HEPARIN LEVEL (UNFRACTIONATED): Heparin Unfractionated: 0.24 [IU]/mL — ABNORMAL LOW (ref 0.30–0.70)

## 2023-07-19 LAB — POCT ACTIVATED CLOTTING TIME
Activated Clotting Time: 232 s
Activated Clotting Time: 269 s
Activated Clotting Time: 232 s

## 2023-07-19 LAB — MAGNESIUM: Magnesium: 1.7 mg/dL (ref 1.7–2.4)

## 2023-07-19 SURGERY — BYPASS GRAFT FEMORAL-POPLITEAL ARTERY
Anesthesia: General | Site: Leg Lower | Laterality: Right

## 2023-07-19 MED ORDER — LACTATED RINGERS IV SOLN
INTRAVENOUS | Status: DC | PRN
Start: 2023-07-19 — End: 2023-07-19

## 2023-07-19 MED ORDER — LABETALOL HCL 5 MG/ML IV SOLN
INTRAVENOUS | Status: AC
  Filled 2023-07-19: qty 4

## 2023-07-19 MED ORDER — HYDROMORPHONE HCL 1 MG/ML IJ SOLN
0.5000 mg | INTRAMUSCULAR | Status: DC | PRN
  Administered 2023-07-20 – 2023-07-22 (×5): 0.5 mg via INTRAVENOUS
  Filled 2023-07-19 (×5): qty 0.5

## 2023-07-19 MED ORDER — HEMOSTATIC AGENTS (NO CHARGE) OPTIME
TOPICAL | Status: DC | PRN
Start: 2023-07-19 — End: 2023-07-19
  Administered 2023-07-19: 2 via TOPICAL

## 2023-07-19 MED ORDER — HEPARIN SODIUM (PORCINE) 1000 UNIT/ML IJ SOLN
INTRAMUSCULAR | Status: DC | PRN
Start: 2023-07-19 — End: 2023-07-19
  Administered 2023-07-19: 6000 [IU] via INTRAVENOUS
  Administered 2023-07-19: 5000 [IU] via INTRAVENOUS

## 2023-07-19 MED ORDER — LABETALOL HCL 5 MG/ML IV SOLN
10.0000 mg | INTRAVENOUS | Status: DC | PRN
  Administered 2023-07-19: 10 mg via INTRAVENOUS

## 2023-07-19 MED ORDER — DEXAMETHASONE SODIUM PHOSPHATE 10 MG/ML IJ SOLN
INTRAMUSCULAR | Status: DC | PRN
  Administered 2023-07-19: 10 mg via INTRAVENOUS

## 2023-07-19 MED ORDER — SODIUM CHLORIDE 0.9 % IV SOLN: INTRAVENOUS | Status: DC

## 2023-07-19 MED ORDER — FENTANYL CITRATE (PF) 250 MCG/5ML IJ SOLN
INTRAMUSCULAR | Status: AC
  Filled 2023-07-19: qty 5

## 2023-07-19 MED ORDER — HEPARIN (PORCINE) 25000 UT/250ML-% IV SOLN
1500.0000 [IU]/h | INTRAVENOUS | Status: DC
  Administered 2023-07-19: 500 [IU]/h via INTRAVENOUS
  Administered 2023-07-22: 1500 [IU]/h via INTRAVENOUS
  Filled 2023-07-19 (×4): qty 250

## 2023-07-19 MED ORDER — IODIXANOL 320 MG/ML IV SOLN
INTRAVENOUS | Status: DC | PRN
  Administered 2023-07-19: 25 mL via INTRA_ARTERIAL

## 2023-07-19 MED ORDER — COLCHICINE 0.6 MG PO TABS
0.6000 mg | ORAL_TABLET | Freq: Every day | ORAL | Status: DC | PRN
  Administered 2023-07-19 – 2023-07-20 (×2): 0.6 mg via ORAL
  Filled 2023-07-19 (×2): qty 1

## 2023-07-19 MED ORDER — POTASSIUM CHLORIDE CRYS ER 20 MEQ PO TBCR: 20.0000 meq | EXTENDED_RELEASE_TABLET | Freq: Every day | ORAL | Status: DC | PRN

## 2023-07-19 MED ORDER — FENTANYL CITRATE (PF) 100 MCG/2ML IJ SOLN: 25.0000 ug | INTRAMUSCULAR | Status: DC | PRN

## 2023-07-19 MED ORDER — PROPOFOL 10 MG/ML IV BOLUS
INTRAVENOUS | Status: AC
  Filled 2023-07-19: qty 20

## 2023-07-19 MED ORDER — DOCUSATE SODIUM 100 MG PO CAPS
100.0000 mg | ORAL_CAPSULE | Freq: Every day | ORAL | Status: DC
  Administered 2023-07-21 – 2023-07-23 (×3): 100 mg via ORAL
  Filled 2023-07-19 (×4): qty 1

## 2023-07-19 MED ORDER — PHENOL 1.4 % MT LIQD: 1.0000 | OROMUCOSAL | Status: DC | PRN

## 2023-07-19 MED ORDER — SODIUM CHLORIDE 0.9 % IV SOLN: 500.0000 mL | Freq: Once | INTRAVENOUS | Status: DC | PRN

## 2023-07-19 MED ORDER — PROPOFOL 10 MG/ML IV BOLUS
INTRAVENOUS | Status: DC | PRN
  Administered 2023-07-19: 100 mg via INTRAVENOUS

## 2023-07-19 MED ORDER — NICOTINE 14 MG/24HR TD PT24
14.0000 mg | MEDICATED_PATCH | Freq: Every day | TRANSDERMAL | Status: DC
  Administered 2023-07-19 – 2023-07-22 (×2): 14 mg via TRANSDERMAL
  Filled 2023-07-19 (×5): qty 1

## 2023-07-19 MED ORDER — MAGNESIUM SULFATE 2 GM/50ML IV SOLN: 2.0000 g | Freq: Every day | INTRAVENOUS | Status: DC | PRN

## 2023-07-19 MED ORDER — PHENYLEPHRINE 80 MCG/ML (10ML) SYRINGE FOR IV PUSH (FOR BLOOD PRESSURE SUPPORT)
PREFILLED_SYRINGE | INTRAVENOUS | Status: DC | PRN
  Administered 2023-07-19 (×2): 80 ug via INTRAVENOUS
  Administered 2023-07-19: 160 ug via INTRAVENOUS
  Administered 2023-07-19 (×3): 80 ug via INTRAVENOUS

## 2023-07-19 MED ORDER — ROCURONIUM BROMIDE 10 MG/ML (PF) SYRINGE
PREFILLED_SYRINGE | INTRAVENOUS | Status: DC | PRN
  Administered 2023-07-19: 50 mg via INTRAVENOUS
  Administered 2023-07-19: 30 mg via INTRAVENOUS

## 2023-07-19 MED ORDER — BISACODYL 10 MG RE SUPP
10.0000 mg | Freq: Every day | RECTAL | Status: DC | PRN
  Administered 2023-07-21: 10 mg via RECTAL
  Filled 2023-07-19: qty 1

## 2023-07-19 MED ORDER — 0.9 % SODIUM CHLORIDE (POUR BTL) OPTIME
TOPICAL | Status: DC | PRN
  Administered 2023-07-19: 1000 mL

## 2023-07-19 MED ORDER — CEFAZOLIN SODIUM-DEXTROSE 2-4 GM/100ML-% IV SOLN
INTRAVENOUS | Status: AC
  Filled 2023-07-19: qty 100

## 2023-07-19 MED ORDER — HYDROMORPHONE HCL 1 MG/ML IJ SOLN
INTRAMUSCULAR | Status: DC | PRN
Start: 2023-07-19 — End: 2023-07-19
  Administered 2023-07-19 (×2): .25 mg via INTRAVENOUS

## 2023-07-19 MED ORDER — CHLORHEXIDINE GLUCONATE 0.12 % MT SOLN
OROMUCOSAL | Status: AC
  Filled 2023-07-19: qty 15

## 2023-07-19 MED ORDER — ALBUMIN HUMAN 5 % IV SOLN
INTRAVENOUS | Status: DC | PRN
Start: 2023-07-19 — End: 2023-07-19

## 2023-07-19 MED ORDER — CEFAZOLIN SODIUM-DEXTROSE 2-4 GM/100ML-% IV SOLN
2.0000 g | Freq: Three times a day (TID) | INTRAVENOUS | Status: AC
  Administered 2023-07-19 – 2023-07-20 (×3): 2 g via INTRAVENOUS
  Filled 2023-07-19 (×3): qty 100

## 2023-07-19 MED ORDER — EPHEDRINE SULFATE-NACL 50-0.9 MG/10ML-% IV SOSY
PREFILLED_SYRINGE | INTRAVENOUS | Status: DC | PRN
  Administered 2023-07-19 (×2): 10 mg via INTRAVENOUS

## 2023-07-19 MED ORDER — CEFAZOLIN SODIUM-DEXTROSE 2-3 GM-%(50ML) IV SOLR
INTRAVENOUS | Status: DC | PRN
  Administered 2023-07-19: 2 g via INTRAVENOUS

## 2023-07-19 MED ORDER — LIDOCAINE 2% (20 MG/ML) 5 ML SYRINGE
INTRAMUSCULAR | Status: DC | PRN
  Administered 2023-07-19: 60 mg via INTRAVENOUS

## 2023-07-19 MED ORDER — METOPROLOL TARTRATE 5 MG/5ML IV SOLN: 2.0000 mg | INTRAVENOUS | Status: DC | PRN

## 2023-07-19 MED ORDER — HEPARIN 6000 UNIT IRRIGATION SOLUTION
Status: DC | PRN
  Administered 2023-07-19: 1

## 2023-07-19 MED ORDER — SUGAMMADEX SODIUM 200 MG/2ML IV SOLN
INTRAVENOUS | Status: DC | PRN
  Administered 2023-07-19: 200 mg via INTRAVENOUS

## 2023-07-19 MED ORDER — HEPARIN 6000 UNIT IRRIGATION SOLUTION
Status: AC
  Filled 2023-07-19: qty 500

## 2023-07-19 MED ORDER — GUAIFENESIN-DM 100-10 MG/5ML PO SYRP: 15.0000 mL | ORAL_SOLUTION | ORAL | Status: DC | PRN

## 2023-07-19 MED ORDER — ONDANSETRON HCL 4 MG/2ML IJ SOLN: 4.0000 mg | Freq: Four times a day (QID) | INTRAMUSCULAR | Status: DC | PRN

## 2023-07-19 MED ORDER — PHENYLEPHRINE HCL-NACL 20-0.9 MG/250ML-% IV SOLN
INTRAVENOUS | Status: DC | PRN
Start: 2023-07-19 — End: 2023-07-19
  Administered 2023-07-19: 50 ug/min via INTRAVENOUS

## 2023-07-19 MED ORDER — CHLORHEXIDINE GLUCONATE CLOTH 2 % EX PADS
6.0000 | MEDICATED_PAD | Freq: Every day | CUTANEOUS | Status: DC
  Administered 2023-07-19 – 2023-07-21 (×3): 6 via TOPICAL

## 2023-07-19 MED ORDER — POLYETHYLENE GLYCOL 3350 17 G PO PACK
17.0000 g | PACK | Freq: Every day | ORAL | Status: DC | PRN
  Administered 2023-07-21: 17 g via ORAL
  Filled 2023-07-19: qty 1

## 2023-07-19 MED ORDER — FENTANYL CITRATE (PF) 250 MCG/5ML IJ SOLN
INTRAMUSCULAR | Status: DC | PRN
  Administered 2023-07-19: 50 ug via INTRAVENOUS
  Administered 2023-07-19: 150 ug via INTRAVENOUS
  Administered 2023-07-19: 50 ug via INTRAVENOUS

## 2023-07-19 MED ORDER — ASPIRIN 81 MG PO TBEC
81.0000 mg | DELAYED_RELEASE_TABLET | Freq: Every day | ORAL | Status: DC
  Administered 2023-07-20 – 2023-07-23 (×4): 81 mg via ORAL
  Filled 2023-07-19 (×4): qty 1

## 2023-07-19 MED ORDER — HYDROMORPHONE HCL 1 MG/ML IJ SOLN
INTRAMUSCULAR | Status: AC
  Filled 2023-07-19: qty 0.5

## 2023-07-19 MED ORDER — LABETALOL HCL 5 MG/ML IV SOLN: 10.0000 mg | INTRAVENOUS | Status: DC | PRN

## 2023-07-19 SURGICAL SUPPLY — 68 items
ADH SKN CLS APL DERMABOND .7 (GAUZE/BANDAGES/DRESSINGS) ×4
APL PRP STRL LF DISP 70% ISPRP (MISCELLANEOUS) ×2
BAG COUNTER SPONGE SURGICOUNT (BAG) ×2 IMPLANT
BAG SNAP BAND KOVER 36X36 (MISCELLANEOUS) ×2 IMPLANT
BAG SPNG CNTER NS LX DISP (BAG) ×2
BANDAGE ESMARK 6X9 LF (GAUZE/BANDAGES/DRESSINGS) IMPLANT
BNDG CMPR 9X6 STRL LF SNTH (GAUZE/BANDAGES/DRESSINGS)
BNDG ESMARK 6X9 LF (GAUZE/BANDAGES/DRESSINGS)
CANISTER SUCT 3000ML PPV (MISCELLANEOUS) ×2 IMPLANT
CANNULA VESSEL 3MM 2 BLNT TIP (CANNULA) IMPLANT
CATH ANGIO 5F BER2 65CM (CATHETERS) IMPLANT
CATH EMB 3FR 80 (CATHETERS) IMPLANT
CATH EMB 4FR 80 (CATHETERS) IMPLANT
CHLORAPREP W/TINT 26 (MISCELLANEOUS) ×2 IMPLANT
CLIP LIGATING EXTRA MED SLVR (CLIP) ×2 IMPLANT
CLIP LIGATING EXTRA SM BLUE (MISCELLANEOUS) ×2 IMPLANT
COVER DOME SNAP 22 D (MISCELLANEOUS) ×2 IMPLANT
COVER PROBE W GEL 5X96 (DRAPES) ×2 IMPLANT
COVER SURGICAL LIGHT HANDLE (MISCELLANEOUS) ×2 IMPLANT
DERMABOND ADVANCED .7 DNX12 (GAUZE/BANDAGES/DRESSINGS) ×4 IMPLANT
DRAPE C-ARM 42X72 X-RAY (DRAPES) IMPLANT
DRAPE HALF SHEET 40X57 (DRAPES) IMPLANT
ELECT REM PT RETURN 9FT ADLT (ELECTROSURGICAL) ×2
ELECTRODE REM PT RTRN 9FT ADLT (ELECTROSURGICAL) ×2 IMPLANT
GLOVE BIO SURGEON STRL SZ 6.5 (GLOVE) IMPLANT
GLOVE BIO SURGEON STRL SZ7.5 (GLOVE) ×2 IMPLANT
GOWN STRL REUS W/ TWL LRG LVL3 (GOWN DISPOSABLE) ×4 IMPLANT
GOWN STRL REUS W/ TWL XL LVL3 (GOWN DISPOSABLE) ×2 IMPLANT
GOWN STRL REUS W/TWL LRG LVL3 (GOWN DISPOSABLE) ×4
GOWN STRL REUS W/TWL XL LVL3 (GOWN DISPOSABLE) ×2
GRAFT PROPATEN W/RING 6X80X60 (Vascular Products) IMPLANT
INSERT FOGARTY SM (MISCELLANEOUS) IMPLANT
KIT BASIN OR (CUSTOM PROCEDURE TRAY) ×2 IMPLANT
KIT TURNOVER KIT B (KITS) ×2 IMPLANT
NDL PERC 18GX7CM (NEEDLE) ×2 IMPLANT
NEEDLE PERC 18GX7CM (NEEDLE) ×2 IMPLANT
NS IRRIG 1000ML POUR BTL (IV SOLUTION) ×4 IMPLANT
PACK PERIPHERAL VASCULAR (CUSTOM PROCEDURE TRAY) ×2 IMPLANT
PAD ARMBOARD 7.5X6 YLW CONV (MISCELLANEOUS) ×4 IMPLANT
PATCH VASC XENOSURE 1X6 (Vascular Products) IMPLANT
POWDER SURGICEL 3.0 GRAM (HEMOSTASIS) IMPLANT
PROTECTION STATION PRESSURIZED (MISCELLANEOUS) ×2
SET MICROPUNCTURE 5F STIFF (MISCELLANEOUS) ×2 IMPLANT
SPONGE T-LAP 18X18 ~~LOC~~+RFID (SPONGE) IMPLANT
STATION PROTECTION PRESSURIZED (MISCELLANEOUS) ×2 IMPLANT
STOPCOCK 4 WAY LG BORE MALE ST (IV SETS) IMPLANT
STOPCOCK MORSE 400PSI 3WAY (MISCELLANEOUS) ×2 IMPLANT
SUT ETHILON 3 0 PS 1 (SUTURE) IMPLANT
SUT MNCRL AB 4-0 PS2 18 (SUTURE) ×4 IMPLANT
SUT PROLENE 5 0 C 1 24 (SUTURE) ×2 IMPLANT
SUT PROLENE 6 0 BV (SUTURE) ×2 IMPLANT
SUT SILK 2 0 SH (SUTURE) ×2 IMPLANT
SUT SILK 3 0 (SUTURE)
SUT SILK 3-0 18XBRD TIE 12 (SUTURE) IMPLANT
SUT VIC AB 2-0 CT1 27 (SUTURE) ×4
SUT VIC AB 2-0 CT1 TAPERPNT 27 (SUTURE) ×4 IMPLANT
SUT VIC AB 3-0 SH 27 (SUTURE) ×4
SUT VIC AB 3-0 SH 27X BRD (SUTURE) ×4 IMPLANT
SYR 10ML LL (SYRINGE) ×6 IMPLANT
SYR 20ML LL LF (SYRINGE) ×2 IMPLANT
SYR 30ML LL (SYRINGE) ×2 IMPLANT
SYR MEDRAD MARK V 150ML (SYRINGE) IMPLANT
TOWEL GREEN STERILE (TOWEL DISPOSABLE) ×4 IMPLANT
TRAY FOLEY MTR SLVR 16FR STAT (SET/KITS/TRAYS/PACK) ×2 IMPLANT
TUBING HIGH PRESSURE 120CM (CONNECTOR) IMPLANT
UNDERPAD 30X36 HEAVY ABSORB (UNDERPADS AND DIAPERS) ×2 IMPLANT
WATER STERILE IRR 1000ML POUR (IV SOLUTION) ×2 IMPLANT
WIRE BENTSON .035X145CM (WIRE) ×2 IMPLANT

## 2023-07-19 NOTE — Plan of Care (Signed)
CHL Tonsillectomy/Adenoidectomy, Postoperative PEDS care plan entered in error.

## 2023-07-19 NOTE — Transfer of Care (Signed)
Immediate Anesthesia Transfer of Care Note  Patient: Theodore Arellano  Procedure(s) Performed: REDO RIGHT FEMORAL-POPLITEAL ARTERY BYPASS GRAFT (Right) LOWER EXTREMITY ANGIOGRAM (Right) PATCH ANGIOPLASTY USING XENOSURE BIOLOGIC PATCH (Right: Leg Lower) THROMBECTOMY OF BYPASS GRAFT FEMORAL-POPLITEAL ARTERY (Right: Leg Lower)  Patient Location: PACU  Anesthesia Type:General  Level of Consciousness: awake, alert , and oriented  Airway & Oxygen Therapy: Patient Spontanous Breathing and Patient connected to face mask oxygen  Post-op Assessment: Report given to RN and Post -op Vital signs reviewed and stable  Post vital signs: Reviewed and stable  Last Vitals:  Vitals Value Taken Time  BP 186/87 07/19/23 1116  Temp    Pulse 71 07/19/23 1115  Resp 11 07/19/23 1117  SpO2 92 % 07/19/23 1115  Vitals shown include unfiled device data.  Last Pain:  Vitals:   07/19/23 0713  TempSrc:   PainSc: 3       Patients Stated Pain Goal: 3 (07/17/23 0346)  Complications: No notable events documented.

## 2023-07-19 NOTE — Progress Notes (Signed)
  Progress Note    07/19/2023 7:20 AM Day of Surgery  Subjective: Having numbness of right foot  Vitals:   07/19/23 0423 07/19/23 0641  BP: (!) 170/88 (!) 150/86  Pulse: 76 75  Resp: 16 18  Temp: 98.5 F (36.9 C) 98.2 F (36.8 C)  SpO2: 91% 97%    Physical Exam: Awake alert and oriented Nonlabored respirations Bounding right common femoral pulse Right foot is warm and motor intact but he states there is numbness with touch to the dorsum of the foot  CBC    Component Value Date/Time   WBC 8.9 07/19/2023 0524   RBC 3.23 (L) 07/19/2023 0524   HGB 9.2 (L) 07/19/2023 0524   HCT 28.1 (L) 07/19/2023 0524   PLT 153 07/19/2023 0524   MCV 87.0 07/19/2023 0524   MCH 28.5 07/19/2023 0524   MCHC 32.7 07/19/2023 0524   RDW 16.2 (H) 07/19/2023 0524   LYMPHSABS 1.5 07/16/2023 0423   MONOABS 0.5 07/16/2023 0423   EOSABS 0.2 07/16/2023 0423   BASOSABS 0.0 07/16/2023 0423    BMET    Component Value Date/Time   NA 138 07/19/2023 0524   K 4.3 07/19/2023 0524   CL 105 07/19/2023 0524   CO2 21 (L) 07/19/2023 0524   GLUCOSE 96 07/19/2023 0524   BUN 30 (H) 07/19/2023 0524   CREATININE 1.89 (H) 07/19/2023 0524   CALCIUM 9.1 07/19/2023 0524   GFRNONAA 39 (L) 07/19/2023 0524   GFRAA >60 01/13/2020 0248    INR    Component Value Date/Time   INR 1.6 (H) 01/12/2020 0656     Intake/Output Summary (Last 24 hours) at 07/19/2023 0720 Last data filed at 07/19/2023 0425 Gross per 24 hour  Intake 181.39 ml  Output 1930 ml  Net -1748.61 ml     Assessment/plan:  62 y.o. male is s/p revascularization of the right lower extremity with axillary to profunda bypass and jump graft to the below-knee popliteal which was thrombectomized.  Unfortunately the below-knee popliteal graft has rethrombosed.  Patient has very mild Rutherford 2A acute limb ischemia at this time but does have some numbness.  Plan will be to revise the jump graft to the below-knee popliteal artery likely place a new  graft and thrombectomize below the knee.  I have discussed with the patient that this is a limb threatening situation and he demonstrates good understanding.    Blaine Hari C. Randie Heinz, MD [facility] Office: [phone] Pager: [phone]  07/19/2023 7:20 AM

## 2023-07-19 NOTE — Op Note (Signed)
Patient name: Theodore Arellano MRN: [id number] DOB: 02/18/1961 Sex: male  07/19/2023 Pre-operative Diagnosis: Acute right lower extremity limb threatening ischemia Rutherford 2A with occluded right femoropopliteal bypass Post-operative diagnosis:  Same Surgeon:  Luanna Salk. Randie Heinz, MD Assistant: Doreatha Massed, PA Procedure Performed: 1.  Reexposure right groin and below-knee popliteal incisions less than 30 days 2.  Thrombectomy right peroneal and posterior tibial arteries via below-knee popliteal exposure 3.  Bypass from axillary to profunda 8 mm ringed PTFE to below-knee popliteal artery with 6mm ringed ptfe 4.  Right popliteal and tibioperoneal trunk endarterectomy with bovine pericardial patch angioplasty 5.  Right lower extremity angiogram  Indications: 62 year old male has an extensive right lower extremity vascular history with previous bypasses in the right lower extremity ultimately has a ligated common femoral artery with a right axillary to profunda bypass with a jump graft to the below-knee popliteal artery which was noted to be occluded for at least 3 weeks.  3 days prior to this procedure he was taken to the operating room for attempted thrombectomy and ultimately had redo bypass from the axillary down to the profunda as well as to the jump graft and had thrombectomy of the jump graft to the below-knee popliteal artery with patch angioplasty at the popliteal and tibioperoneal trunk level.  Unfortunately the patient had an occluded bypass in the leg noted on postoperative day 1 with minimal symptoms.  After reviewing all of his options we have elected for repeat exposure of the groin with likely new bypass to below-knee popliteal artery and thrombectomy with angiogram.  We have discussed all of his options he demonstrates good understanding.  An experienced assistant was necessary to facilitate full exposure in the groin and below-knee vessels as well as perform repeat bypass from the  existing PTFE bypass graft to the below-knee popliteal artery as well as endarterectomy of the popliteal and tibioperoneal trunk with associated patch angioplasty.  Findings: The existing axillary based bypass graft was patent with strong pulsatility and the profunda had very strong pulsatility in it as well.  The bypass to the below-knee popliteal artery was noted to be thrombosed.  Below the knee there was significant calcium in the popliteal extending into the tibioperoneal trunk and ultimately this was opened down to the level of the posterior tibial takeoff where there was noted to be thrombus throughout the posterior tibial and at the takeoff of the peroneal.  I was able to pass a Fogarty catheter 30 cm into the peroneal and return thrombus on 1 pass and 35 cm down the posterior tibial artery and return thrombus on 1 pass as well and the second passes and both of these arteries were clean and there was very strong backbleeding.  I elected to leave the existing thrombosed bypass graft in place and tunneled a new 6 mm ringed PTFE bypass graft.  I did perform endarterectomy bleeding right into the posterior tibial artery and the artery was much softer and amenable for bypass as I do think that the outflow was the problem with the most recent procedure.  I then performed bovine pericardial patch angioplasty and then bypass from the existing 8 mm ringed PTFE proximally in an end to side fashion down to the level of the bloody popliteal artery patch angioplasty also in an end to side fashion.  We then performed angiography which demonstrated patency of the profunda as well as the graft and the thigh and then I could easily visualize anterior tibial and peroneal runoff  below the knee and at the ankle level there was runoff via all 3 vessels.  We did minimize contrast use given patient's elevated creatinine with history of renal artery transplant.   Procedure:  The patient was identified in the holding area and  taken to the operating room where he was placed supine on upper table and general anesthesia was induced.  He was sterilely prepped and draped in the right axilla and clavicular area as well as the right chest and abdomen including the right groin and entire right lower extremity, antibiotics were administered and a timeout was called.  Heparin was discontinued on the call to the OR.  We began by reopening both incisions first in the groin and exploring the bypass and the existing PTFE graft which did not have pulsatility but the profunda did have pulsatility.  We then turned our attention to the below-knee popliteal incision and opened this.  There was no pulsatility below the knee.  Patient was fully heparinized again.  I initially tacked our existing bovine pericardial patch with 5-0 Prolene suture at the level of the popliteal artery and I did this on both sides and then opened of the toe of the patch down onto the tibioperoneal trunk all the way down to the bifurcation of the peroneal and posterior tibial arteries.  There was acute thrombus there and I performed embolectomy of the posterior tibial and peroneal arteries with 1 pass each returning clot and the second passes were both clean.  Given the calcification there I then performed endarterectomy which ended just at the takeoff of the 2 vessels there was very strong backbleeding from both.  I was able to clamp the posterior tibial artery with a bulldog clamp and then placed a 3 Fogarty into the peroneal artery to maintain hemostasis.  We then fashioned a bovine pericardial patch and sewed this in place with 5-0 Prolene suture.  Prior to completion with left Lapra-Ty saline and opened there was adequate backbleeding to fill the patch itself.  I then tunneled a 6 mm ringed PTFE between the groin and below-knee incisions.  In the groin I clamped the profunda as well as the inflow graft and then opened the hood of the graft.  I could see down the profunda and  unclamped this there was very strong backbleeding.  I then trimmed the 6 mm ringed PTFE graft to size and sewn this into side to the existing 8 mm ringed PTFE.  We then released the clamps and flush through the 6 mm ringed PTFE to the below-knee and then this was clamped and flushed with heparinized saline.  Attention was then turned distally.  I opened the patch longitudinally below the knee.  There was backbleeding from all vessels and this was controlled with clamps.  We then straighten the leg and trimmed the PTFE graft to size and sewn this into side to the patch with 6-0 Prolene suture.  Prior to completion we allowed flushing all directions.  Upon completion there was very strong signal in the peroneal and posterior tibial arteries in the wound bed as well as at the ankle.  We then turned our attention back to the groin where I cannulated the 8 mm graft with a micropuncture needle followed by wire and sheath and performed right lower extremity angiography minimizing contrast given the history of renal transplant.  With the above findings we then removed that clamped the graft and closed the graftotomy site with 5-0 Prolene suture.  I did  not want to administer heparin and we did obtain meticulous hemostasis in the groins and the below-knee incision and then thoroughly irrigated.  Both incisions were then closed with Vicryl and Monocryl and again Dermabond was placed at the skin level.  The patient was awakened from anesthesia having tolerated the procedure without any complication.  All counts were correct at completion.  EBL: 250 cc  Contrast: 25 cc     Joseff Luckman C. Randie Heinz, MD [facility] Office: [phone] Pager: [phone]

## 2023-07-19 NOTE — Progress Notes (Signed)
  Day of Surgery Note    Subjective:  in recovery   Vitals:   07/19/23 1130 07/19/23 1145  BP: (!) 148/81 (!) 147/81  Pulse: (!) 57 (!) 57  Resp: 16 15  Temp:  97.6 F (36.4 C)  SpO2: 100% 98%    Incisions:   right groin and right below knee incisions look fine Extremities:  brisk doppler flow right DP/PT/pero Cardiac:  regular Lungs:  non labored    Assessment/Plan:  This is a 62 y.o. male who is s/p  Redo right femoral to popliteal bypass with PTFE and angiogram  -pt with brisk doppler flow right foot -incisions look fine without hematoma -heparin 500U/hr to start at 1500 -back to 4 east later this afternoon   Doreatha Massed, PA-C 07/19/2023 11:57 AM [phone]

## 2023-07-19 NOTE — Anesthesia Preprocedure Evaluation (Signed)
Anesthesia Evaluation  Patient identified by MRN, date of birth, ID band Patient awake    Reviewed: Allergy & Precautions, H&P , NPO status , Patient's Chart, lab work & pertinent test results  Airway Mallampati: II   Neck ROM: full    Dental   Pulmonary Current Smoker and Patient abstained from smoking.   breath sounds clear to auscultation       Cardiovascular hypertension, + CAD, + Peripheral Vascular Disease and +CHF  + dysrhythmias  Rhythm:regular Rate:Normal     Neuro/Psych  PSYCHIATRIC DISORDERS Anxiety        GI/Hepatic   Endo/Other  diabetes, Type 2    Renal/GU ESRFRenal disease     Musculoskeletal  (+) Arthritis ,    Abdominal   Peds  Hematology   Anesthesia Other Findings   Reproductive/Obstetrics                             Anesthesia Physical Anesthesia Plan  ASA: 4  Anesthesia Plan: General   Post-op Pain Management:    Induction: Intravenous  PONV Risk Score and Plan: 1 and Ondansetron, Dexamethasone and Treatment may vary due to age or medical condition  Airway Management Planned: Oral ETT  Additional Equipment: Arterial line  Intra-op Plan:   Post-operative Plan: Extubation in OR  Informed Consent: I have reviewed the patients History and Physical, chart, labs and discussed the procedure including the risks, benefits and alternatives for the proposed anesthesia with the patient or authorized representative who has indicated his/her understanding and acceptance.     Dental advisory given  Plan Discussed with: CRNA, Anesthesiologist and Surgeon  Anesthesia Plan Comments:        Anesthesia Quick Evaluation

## 2023-07-19 NOTE — Progress Notes (Signed)
PROGRESS NOTE  Theodore Arellano  DOB: 04-Dec-1960  PCP: [name], MD LKG:[id number]  DOA: 07/15/2023  LOS: 4 days  Hospital Day: 5  Brief narrative: Theodore Arellano is a 62 y.o. male with PMH significant for DM2, HTN, HLD, CAD/CABG, systolic CHF, CKD stage 3b h/o renal transplant,  PAD with h/o critical lower limb ischemia in 2022, legally blind in right eye,  h/o DVT on xarelto, h/o cocaine abuse, GERD, gout, glaucoma.   10/14, patient presented with pain in the right lower extremity ongoing for about 3 weeks.  Concern was for vascular compromise.  Patient was hospitalized for further management.  Vascular surgery was consulted.   Subjective: Patient was seen and examined this afternoon. Back from the OR.  Not in distress..  Assessment and plan: Acute on chronic ischemia right lower extremity S/p thrombectomy, angioplasty and bypass -10/15 Dr. Randie Heinz S/p redo of right femoropopliteal bypass and angiogram - 10/17 Continue heparin drip. Continue pain management Continue Crestor Vascular surgery follow-up   H/o CAD/CABG No anginal symptoms currently.     AKI on CKD 3B Chronic metabolic acidosis H/o renal transplant Baseline creatinine seems to be around 1.5.   Came in with creatinine of 1.93.  Likely secondary to hypovolemia.   Creatinine gradually improving with IV fluid. continue mycophenolate, prednisone, tacrolimus.  Continue sodium bicarb supplement Recent Labs    11/07/22 1528 11/08/22 0136 11/10/22 2217 12/28/22 0842 07/15/23 1932 07/16/23 0425 07/17/23 0310 07/18/23 0325 07/19/23 0524  BUN 23 23 22 18  34* 30* 23 33* 30*  CREATININE 1.43* 1.51* 1.52* 1.58* 1.93* 1.80* 1.89* 2.01* 1.89*   Chronic combined systolic and diastolic CHF Hypertension Echocardiogram from February showed EF of 30 to 35%.  Seems to be fairly euvolemic.   Currently on IV fluid.  Lasix and Cozaar on hold due to AKI.    If renal function is stable tomorrow, we will stop IV  hydration to prevent CHF exacerbation.  Type 2 diabetes mellitus A1c 6.4 on 07/16/2023 PTA meds- Continue SSI/Accu-Cheks Recent Labs  Lab 07/18/23 1634 07/18/23 2035 07/19/23 0030 07/19/23 0421 07/19/23 1202  GLUCAP 101* 122* 100* 102* 126*   H/o DVT Patient was on Xarelto prior to admission which is currently on hold.   Currently on IV heparin   Mobility: PT eval obtained.  SNF recommended but patient wants to go home rather  Goals of care   Code Status: Full Code     DVT prophylaxis: Heparin drip SCD's Start: 07/19/23 1431   Antimicrobials: None Fluid: None Consultants: Vascular surgery Family Communication: None at bedside  Status: Inpatient Level of care:  Progressive   Patient is from: Home Needs to continue in-hospital care: Pending femoropopliteal redo today. Anticipated d/c to: SNF recommended.  Patient wants to go home   Diet:  Diet Order             Diet NPO time specified  Diet effective now                   Scheduled Meds:  [START ON 07/20/2023] aspirin EC  81 mg Oral Q0600   chlorhexidine       Chlorhexidine Gluconate Cloth  6 each Topical Daily   [START ON 07/20/2023] docusate sodium  100 mg Oral Daily   insulin aspart  0-6 Units Subcutaneous Q4H   labetalol       mupirocin ointment   Nasal BID   mycophenolate  720 mg Oral BID   pantoprazole  40  mg Oral q AM   predniSONE  5 mg Oral Daily   pregabalin  75 mg Oral BID   rosuvastatin  5 mg Oral Daily   sodium bicarbonate  1,300 mg Oral BID   tacrolimus  3 mg Oral q morning   And   tacrolimus  2 mg Oral QHS   tamsulosin  0.4 mg Oral QHS    PRN meds: sodium chloride, acetaminophen **OR** acetaminophen, albuterol, bisacodyl, ceFAZolin, chlorhexidine, guaiFENesin-dextromethorphan, hydrALAZINE, HYDROmorphone (DILAUDID) injection, labetalol, labetalol, magnesium sulfate bolus IVPB, metoprolol tartrate, ondansetron **OR** ondansetron (ZOFRAN) IV, oxyCODONE, phenol, polyethylene glycol,  potassium chloride   Infusions:   sodium chloride     sodium chloride     ceFAZolin      ceFAZolin (ANCEF) IV     heparin     magnesium sulfate bolus IVPB      Antimicrobials: Anti-infectives (From admission, onward)    Start     Dose/Rate Route Frequency Ordered Stop   07/19/23 1530  ceFAZolin (ANCEF) IVPB 2g/100 mL premix        2 g 200 mL/hr over 30 Minutes Intravenous Every 8 hours 07/19/23 1431 07/20/23 1529   07/19/23 0723  ceFAZolin (ANCEF) 2-4 GM/100ML-% IVPB       Note to Pharmacy: Susy [place] L: cabinet override      07/19/23 0723 07/19/23 1929       Objective: Vitals:   07/19/23 1145 07/19/23 1202  BP: (!) 147/81 (!) 144/86  Pulse: (!) 57   Resp: 15   Temp: 97.6 F (36.4 C) 97.6 F (36.4 C)  SpO2: 98%     Intake/Output Summary (Last 24 hours) at 07/19/2023 1501 Last data filed at 07/19/2023 1119 Gross per 24 hour  Intake 614.65 ml  Output 2730 ml  Net -2115.35 ml   Filed Weights   07/16/23 0004 07/19/23 0641  Weight: 77.2 kg 77.1 kg   Weight change:  Body mass index is 26.63 kg/m.   Physical Exam: General exam: Pleasant, middle-aged African-American male Skin: No rashes, lesions or ulcers. HEENT: Atraumatic, normocephalic, no obvious bleeding Lungs: Clear to auscultation bilaterally CVS: Regular rate and rhythm, no murmur GI/Abd soft, nontender, nondistended, bowel sound present CNS: Alert, awake, oriented x 3 Psychiatry: Mood appropriate Extremities: Right lower extremity postsurgical status  Data Review: I have personally reviewed the laboratory data and studies available.  F/u labs ordered Unresulted Labs (From admission, onward)     Start     Ordered   07/20/23 0500  Basic metabolic panel  Daily,   R      07/19/23 1220   07/20/23 0500  APTT  (Heparin infusion - physician dosing)  Daily,   R     Question:  Specimen collection method  Answer:  Lab=Lab collect   07/19/23 1431   07/20/23 0500  Heparin level (unfractionated)   Daily,   R     Question:  Specimen collection method  Answer:  Lab=Lab collect   07/19/23 1441   07/19/23 1431  CBC  (Heparin infusion - physician dosing)  Once,   R       Comments: Baseline for heparin therapy IF NOT ALREADY DRAWN, notify MD if PLT < 100 K   Question:  Specimen collection method  Answer:  Lab=Lab collect   07/19/23 1431   07/17/23 0500  Basic metabolic panel  Daily,   R      07/16/23 0946   07/16/23 1052  Surgical pcr screen  Once,   R  07/16/23 1051   07/16/23 0500  CBC  Daily,   R      07/16/23 0010   07/15/23 2343  Creatinine, urine, random  Once,   R        07/15/23 2343   07/15/23 2343  Osmolality, urine  Once,   R        07/15/23 2343   07/15/23 2237  Urinalysis, Routine w reflex microscopic -Urine, Clean Catch  ONCE - STAT,   URGENT       Question:  Specimen Source  Answer:  Urine, Clean Catch   07/15/23 2236   07/15/23 2237  Rapid urine drug screen (hospital performed)  ONCE - STAT,   STAT        07/15/23 2236   Signed and Held  CBC  Tomorrow morning,   R       Question:  Specimen collection method  Answer:  Lab=Lab collect   Signed and Held   Signed and Held  Basic metabolic panel  Tomorrow morning,   R       Question:  Specimen collection method  Answer:  Lab=Lab collect   Signed and Held            Total time spent in review of labs and imaging, patient evaluation, formulation of plan, documentation and communication with family: 45 minutes  Signed, Lorin Glass, MD [facility] 07/19/2023

## 2023-07-19 NOTE — Discharge Instructions (Signed)
° °[facility] [place] ° °Discharge instructions ° °Lower Extremity Bypass Surgery ° °Please refer to the following instruction for your post-procedure care. Your surgeon or physician assistant will discuss any changes with you. ° °Activity ° °You are encouraged to walk as much as you can. You can slowly return to normal activities during the month after your surgery. Avoid strenuous activity and heavy lifting until your doctor tells you it's OK. Avoid activities such as vacuuming or swinging a golf club. Do not drive until your doctor give the OK and you are no longer taking prescription pain medications. It is also normal to have difficulty with sleep habits, eating and bowel movement after surgery. These will go away with time. ° °Bathing/Showering ° °Shower daily after you go home. Do not soak in a bathtub, hot tub, or swim until the incision heals completely. ° °Incision Care ° °Clean your incision with mild soap and water. Shower every day. Pat the area dry with a clean towel. You do not need a bandage unless otherwise instructed. Do not apply any ointments or creams to your incision. If you have open wounds you will be instructed how to care for them or a visiting nurse may be arranged for you. If you have staples or sutures along your incision they will be removed at your post-op appointment. You may have skin glue on your incision. Do not peel it off. It will come off on its own in about one week. ° °Wash the groin wound with soap and water daily and pat dry. (No tub bath-only shower)  Then put a dry gauze or washcloth in the groin to keep this area dry to help prevent wound infection.  Do this daily and as needed.  Do not use Vaseline or neosporin on your incisions.  Only use soap and water on your incisions and then protect and keep dry. ° °Diet ° °Resume your normal diet. There are no special food restrictions following this procedure. A low fat/ low cholesterol diet is  recommended for all patients with vascular disease. In order to heal from your surgery, it is CRITICAL to get adequate nutrition. Your body requires vitamins, minerals, and protein. Vegetables are the best source of vitamins and minerals. Vegetables also provide the perfect balance of protein. Processed food has little nutritional value, so try to avoid this. ° °Medications ° °Resume taking all your medications unless your doctor or physician assistant tells you not to. If your incision is causing pain, you may take over-the-counter pain relievers such as acetaminophen (Tylenol). If you were prescribed a stronger pain medication, please aware these medication can cause nausea and constipation. Prevent nausea by taking the medication with a snack or meal. Avoid constipation by drinking plenty of fluids and eating foods with high amount of fiber, such as fruits, vegetables, and grains. Take Colace 100 mg (an over-the-counter stool softener) twice a day as needed for constipation.  °Do not take Tylenol if you are taking prescription pain medications. ° °Follow Up ° °Our office will schedule a follow up appointment 2-3 weeks following discharge. ° °Please call us immediately for any of the following conditions ° °•Severe or worsening pain in your legs or feet while at rest or while walking •Increase pain, redness, warmth, or drainage (pus) from your incision site(s) °• Fever of 101 degree or higher °• The swelling in your leg with the bypass suddenly worsens and becomes more painful than when you were in the hospital °•   If you have been instructed to feel your graft pulse then you should do so every day. If you can no longer feel this pulse, call the office immediately. Not all patients are given this instruction. °•  °Leg swelling is common after leg bypass surgery. ° °The swelling should improve over a few months following surgery. To improve the swelling, you may elevate your legs above the level of your heart while  you are sitting or resting. Your surgeon or physician assistant may ask you to apply an ACE wrap or wear compression (TED) stockings to help to reduce swelling. ° °Reduce your risk of vascular disease ° °Stop smoking. If you would like help call [facility] at 1-800-QUIT-NOW (1-800-784-8669) or [facility] at 336-586-4000. ° °• Manage your cholesterol °• Maintain a desired weight °• Control your diabetes weight °• Control your diabetes °• Keep your blood pressure down °•  °If you have any questions, please call the office at [phone] ° ° °

## 2023-07-19 NOTE — Progress Notes (Signed)
PT Cancellation Note  Patient Details Name: Theodore Arellano MRN: [id number] DOB: 1961-05-04   Cancelled Treatment:    Reason Eval/Treat Not Completed: (P) Patient not medically ready (Pt noted to have procedure this morning and therapy deferred this afternoon per RN request. Will continue to follow per PT POC.)   Johny Shock 07/19/2023, 3:47 PM

## 2023-07-19 NOTE — Anesthesia Procedure Notes (Signed)
Procedure Name: Intubation Date/Time: 07/19/2023 7:55 AM  Performed by: Allyn Kenner, CRNAPre-anesthesia Checklist: Patient identified, Emergency Drugs available, Suction available and Patient being monitored Patient Re-evaluated:Patient Re-evaluated prior to induction Oxygen Delivery Method: Circle System Utilized Preoxygenation: Pre-oxygenation with 100% oxygen Induction Type: IV induction Ventilation: Mask ventilation without difficulty Laryngoscope Size: Mac and 4 Grade View: Grade I Tube type: Oral Tube size: 7.0 mm Number of attempts: 1 Airway Equipment and Method: Stylet and Oral airway Placement Confirmation: ETT inserted through vocal cords under direct vision, positive ETCO2 and breath sounds checked- equal and bilateral Tube secured with: Tape Dental Injury: Teeth and Oropharynx as per pre-operative assessment

## 2023-07-19 NOTE — Anesthesia Procedure Notes (Signed)
Arterial Line Insertion Start/End10/18/2024 7:30 AM, 07/19/2023 7:45 AM Performed by: Achille Rich, MD, Allyn Kenner, CRNA, CRNA  Patient location: Pre-op. Preanesthetic checklist: patient identified, IV checked, site marked, risks and benefits discussed, surgical consent, monitors and equipment checked, pre-op evaluation and timeout performed Lidocaine 1% used for infiltration Left, radial was placed Catheter size: 20 G Hand hygiene performed , maximum sterile barriers used  and Seldinger technique used Allen's test indicative of satisfactory collateral circulation Attempts: 2 Procedure performed using ultrasound guided technique. Ultrasound Notes:anatomy identified, needle tip was noted to be adjacent to the nerve/plexus identified and no ultrasound evidence of intravascular and/or intraneural injection Following insertion, dressing applied and Biopatch. Post procedure assessment: normal  Post procedure complications: second provider assisted. Patient tolerated the procedure well with no immediate complications.

## 2023-07-20 DIAGNOSIS — I998 Other disorder of circulatory system: Secondary | ICD-10-CM | POA: Diagnosis not present

## 2023-07-20 LAB — CBC
HCT: 21.7 % — ABNORMAL LOW (ref 39.0–52.0)
Hemoglobin: 7.1 g/dL — ABNORMAL LOW (ref 13.0–17.0)
MCH: 28.3 pg (ref 26.0–34.0)
MCHC: 32.7 g/dL (ref 30.0–36.0)
MCV: 86.5 fL (ref 80.0–100.0)
Platelets: 145 10*3/uL — ABNORMAL LOW (ref 150–400)
RBC: 2.51 MIL/uL — ABNORMAL LOW (ref 4.22–5.81)
RDW: 16 % — ABNORMAL HIGH (ref 11.5–15.5)
WBC: 8.2 10*3/uL (ref 4.0–10.5)
nRBC: 0 % (ref 0.0–0.2)

## 2023-07-20 LAB — BASIC METABOLIC PANEL
Anion gap: 13 (ref 5–15)
BUN: 31 mg/dL — ABNORMAL HIGH (ref 8–23)
CO2: 21 mmol/L — ABNORMAL LOW (ref 22–32)
Calcium: 9.3 mg/dL (ref 8.9–10.3)
Chloride: 101 mmol/L (ref 98–111)
Creatinine, Ser: 1.86 mg/dL — ABNORMAL HIGH (ref 0.61–1.24)
GFR, Estimated: 40 mL/min — ABNORMAL LOW (ref >60)
Glucose, Bld: 119 mg/dL — ABNORMAL HIGH (ref 70–99)
Potassium: 4.2 mmol/L (ref 3.5–5.1)
Sodium: 135 mmol/L (ref 135–145)

## 2023-07-20 LAB — GLUCOSE, CAPILLARY
Glucose-Capillary: 122 mg/dL — ABNORMAL HIGH (ref 70–99)
Glucose-Capillary: 136 mg/dL — ABNORMAL HIGH (ref 70–99)
Glucose-Capillary: 144 mg/dL — ABNORMAL HIGH (ref 70–99)
Glucose-Capillary: 148 mg/dL — ABNORMAL HIGH (ref 70–99)
Glucose-Capillary: 152 mg/dL — ABNORMAL HIGH (ref 70–99)
Glucose-Capillary: 91 mg/dL (ref 70–99)

## 2023-07-20 LAB — PREPARE RBC (CROSSMATCH)

## 2023-07-20 LAB — HEPARIN LEVEL (UNFRACTIONATED): Heparin Unfractionated: <0.1 [IU]/mL — ABNORMAL LOW (ref 0.30–0.70)

## 2023-07-20 LAB — APTT: aPTT: 41 s — ABNORMAL HIGH (ref 24–36)

## 2023-07-20 MED ORDER — SODIUM CHLORIDE 0.9% IV SOLUTION: Freq: Once | INTRAVENOUS | Status: DC

## 2023-07-20 NOTE — Anesthesia Postprocedure Evaluation (Signed)
Anesthesia Post Note  Patient: Theodore Arellano  Procedure(s) Performed: REDO RIGHT FEMORAL-POPLITEAL ARTERY BYPASS GRAFT (Right) LOWER EXTREMITY ANGIOGRAM (Right) PATCH ANGIOPLASTY USING XENOSURE BIOLOGIC PATCH (Right: Leg Lower) THROMBECTOMY OF BYPASS GRAFT FEMORAL-POPLITEAL ARTERY (Right: Leg Lower)     Patient location during evaluation: PACU Anesthesia Type: General Level of consciousness: awake and alert Pain management: pain level controlled Vital Signs Assessment: post-procedure vital signs reviewed and stable Respiratory status: spontaneous breathing, nonlabored ventilation, respiratory function stable and patient connected to nasal cannula oxygen Cardiovascular status: blood pressure returned to baseline and stable Postop Assessment: no apparent nausea or vomiting Anesthetic complications: no   There were no known notable events for this encounter.  Last Vitals:  Vitals:   07/20/23 0200 07/20/23 0403  BP: 132/70 138/77  Pulse: 68 65  Resp: 15 16  Temp:  36.6 C  SpO2: 99% 100%    Last Pain:  Vitals:   07/20/23 0403  TempSrc: Oral  PainSc:                  Yunus Stoklosa S

## 2023-07-20 NOTE — Progress Notes (Signed)
Physical Therapy Treatment Patient Details Name: Theodore Arellano MRN: [id number] DOB: December 18, 1960 Today's Date: 07/20/2023   History of Present Illness Pt is a 62 y/o M admitted on 07/15/23 after presenting with c/o RLE pain. Pt is s/p R axillary-femoral, femoral popliteal embolectomy. PMH: HLD, HTN, CKD 3B, renal transplant, PAD, systolic CHF, legally blind in R eye, CAD s/p CABG, gout, cocaine abuse, glaucoma    PT Comments  Pt received in supine and agreeable to session. Pt reporting increased R foot numbness and R thigh pain this session. Pt requires increased time to adjust to position changes and complete mobility tasks due to RLE pain this session. Pt requires one seated rest break after standing before gait trial. Pt able to tolerate short gait trial, however distance is limited by RLE pain and BUE fatigue. Time spent discussing pt's home situation and pt reporting that no one can provide assistance at discharge. Pt reports concern over safety and managing mobility and ADL's at home independenty. Based on pt's current pain level and deficits, current plan remains appropriate for continued inpatient follow up therapy, <3 hours/day before discharge home to increase functional independence and safety. Acutely, pt continues to benefit from PT services to progress toward functional mobility goals.    If plan is discharge home, recommend the following: A little help with walking and/or transfers;A little help with bathing/dressing/bathroom;Assistance with cooking/housework;Assist for transportation;Help with stairs or ramp for entrance   Can travel by private vehicle     Yes  Equipment Recommendations  Rolling walker (2 wheels);BSC/3in1;Other (comment) (tub transfer bench)    Recommendations for Other Services       Precautions / Restrictions Precautions Precautions: Fall Restrictions Weight Bearing Restrictions: No     Mobility  Bed Mobility Overal bed mobility: Needs Assistance Bed  Mobility: Supine to Sit, Sit to Supine     Supine to sit: Supervision, HOB elevated, Used rails Sit to supine: Contact guard assist, Used rails   General bed mobility comments: increased time/effort due to RLE pain    Transfers Overall transfer level: Needs assistance Equipment used: Rolling walker (2 wheels) Transfers: Sit to/from Stand Sit to Stand: Contact guard assist           General transfer comment: STS from EOB with increased time for power up and to reach an upright posture due to pain    Ambulation/Gait Ambulation/Gait assistance: Contact guard assist Gait Distance (Feet): 30 Feet Assistive device: Rolling walker (2 wheels) Gait Pattern/deviations: Decreased stride length, Decreased dorsiflexion - right, Decreased dorsiflexion - left, Step-to pattern, Trunk flexed Gait velocity: decreased     General Gait Details: slow, short steps with cues for increased LLE step length and upright posture.       Balance Overall balance assessment: Needs assistance Sitting-balance support: No upper extremity supported, Feet supported Sitting balance-Leahy Scale: Fair Sitting balance - Comments: sitting EOB   Standing balance support: Bilateral upper extremity supported, During functional activity, Reliant on assistive device for balance Standing balance-Leahy Scale: Poor Standing balance comment: with RW support                            Cognition Arousal: Alert Behavior During Therapy: WFL for tasks assessed/performed Overall Cognitive Status: No family/caregiver present to determine baseline cognitive functioning  Exercises      General Comments        Pertinent Vitals/Pain Pain Assessment Pain Assessment: 0-10 Pain Score: 8  Pain Location: RLE Pain Descriptors / Indicators: Sore, Aching, Grimacing, Guarding Pain Intervention(s): Limited activity within patient's tolerance, Monitored  during session, Repositioned     PT Goals (current goals can now be found in the care plan section) Acute Rehab PT Goals Patient Stated Goal: get better, for his leg to be okay PT Goal Formulation: With patient Time For Goal Achievement: 07/31/23 Progress towards PT goals: Progressing toward goals    Frequency    Min 1X/week       AM-PAC PT "6 Clicks" Mobility   Outcome Measure  Help needed turning from your back to your side while in a flat bed without using bedrails?: None Help needed moving from lying on your back to sitting on the side of a flat bed without using bedrails?: A Little Help needed moving to and from a bed to a chair (including a wheelchair)?: A Little Help needed standing up from a chair using your arms (e.g., wheelchair or bedside chair)?: A Little Help needed to walk in hospital room?: A Little Help needed climbing 3-5 steps with a railing? : A Lot 6 Click Score: 18    End of Session Equipment Utilized During Treatment: Gait belt Activity Tolerance: Patient limited by pain;Patient limited by fatigue Patient left: in bed;with call bell/phone within reach;with bed alarm set Nurse Communication: Mobility status PT Visit Diagnosis: Muscle weakness (generalized) (M62.81);Pain;Other abnormalities of gait and mobility (R26.89);Difficulty in walking, not elsewhere classified (R26.2)     Time: 1201-1230 PT Time Calculation (min) (ACUTE ONLY): 29 min  Charges:    $Gait Training: 8-22 mins $Therapeutic Activity: 8-22 mins PT General Charges $$ ACUTE PT VISIT: 1 Visit                     Johny Shock, PTA Acute Rehabilitation Services Secure Chat Preferred  [phone]    Johny Shock 07/20/2023, 1:33 PM

## 2023-07-20 NOTE — Progress Notes (Addendum)
  Progress Note    07/20/2023 9:29 AM 1 Day Post-Op  Subjective: Subjectively right foot feels much better compared to before surgery   Vitals:   07/20/23 0403 07/20/23 0803  BP: 138/77 136/78  Pulse: 65 73  Resp: 16 19  Temp: 97.8 F (36.6 C) 98.7 F (37.1 C)  SpO2: 100% 100%   Physical Exam: Lungs:  Non labored Incisions: Right chest incision clean dry and intact; right groin incision well-appearing; right lower leg incision clean dry and intact Extremities: Exam somewhat limited by Doppler however patient has a R DP and PT signal by Doppler Neurologic: A&O  CBC    Component Value Date/Time   WBC 8.2 07/20/2023 0412   RBC 2.51 (L) 07/20/2023 0412   HGB 7.1 (L) 07/20/2023 0412   HCT 21.7 (L) 07/20/2023 0412   PLT 145 (L) 07/20/2023 0412   MCV 86.5 07/20/2023 0412   MCH 28.3 07/20/2023 0412   MCHC 32.7 07/20/2023 0412   RDW 16.0 (H) 07/20/2023 0412   LYMPHSABS 1.5 07/16/2023 0423   MONOABS 0.5 07/16/2023 0423   EOSABS 0.2 07/16/2023 0423   BASOSABS 0.0 07/16/2023 0423    BMET    Component Value Date/Time   NA 135 07/20/2023 0412   K 4.2 07/20/2023 0412   CL 101 07/20/2023 0412   CO2 21 (L) 07/20/2023 0412   GLUCOSE 119 (H) 07/20/2023 0412   BUN 31 (H) 07/20/2023 0412   CREATININE 1.86 (H) 07/20/2023 0412   CALCIUM 9.3 07/20/2023 0412   GFRNONAA 40 (L) 07/20/2023 0412   GFRAA >60 01/13/2020 0248    INR    Component Value Date/Time   INR 1.6 (H) 01/12/2020 0656     Intake/Output Summary (Last 24 hours) at 07/20/2023 0929 Last data filed at 07/20/2023 0800 Gross per 24 hour  Intake 370 ml  Output 4075 ml  Net -3705 ml     Assessment/Plan:  62 y.o. male is s/p right popliteal and TP trunk thrombectomy and endarterectomy with bovine patch angioplasty as well as bypass from the profunda to the below the knee popliteal artery with PTFE 1 Day Post-Op   Right foot is warm with motor and sensation intact.  He has a DP and PT signal by  Doppler Incisions in the groin and below the knee are well-appearing without hematoma Hemoglobin 7.1 this morning; transfuse 1 unit packed red blood cells and check a posttransfusion H&H Continue IV heparin for now PT/OT eval pending     Emilie Rutter, PA-C [facility] [phone] 07/20/2023 9:29 AM   I have seen and evaluated the patient. I agree with the PA note as documented above.  Postop day 1 status post right popliteal and TP trunk thrombectomy with endarterectomy and redo bypass with PTFE.  He has a brisk PT signal at the ankle.  All of his incisions look good.  Will order 1 unit PRBC today given hemoglobin 7.1.  Dr. Darcella Cheshire plan was discharge on DOAC as he moves closer to discharge home.  Cephus Shelling, MD [facility] Office: [phone]

## 2023-07-20 NOTE — Progress Notes (Signed)
PROGRESS NOTE    Theodore Arellano  ZOX:[id number] DOB: [birth year] DOA: 07/15/2023 PCP: [name], MD   Brief Narrative:  Theodore Arellano is a 62 y.o. male with PMH significant for DM2, HTN, HLD, CAD/CABG, systolic CHF, CKD stage 3b h/o renal transplant,  PAD with h/o critical lower limb ischemia in 2022, legally blind in right eye,  h/o DVT on xarelto, h/o cocaine abuse, GERD, gout, glaucoma.    10/14, patient presented with pain in the right lower extremity ongoing for about 3 weeks.  Concern was for vascular compromise.  Patient was hospitalized for further management.  Vascular surgery was consulted.   Assessment & Plan:   Principal Problem:   Vascular insufficiency of extremity Active Problems:   DM (diabetes mellitus) type II, controlled, with peripheral vascular disorder (HCC)   HTN (hypertension)   Chronic systolic CHF (congestive heart failure) (HCC)   Coronary artery disease   Renal transplant recipient   HLD (hyperlipidemia)   Acute kidney injury (HCC)   History of DVT (deep vein thrombosis)   CKD (chronic kidney disease)   Tobacco abuse   Critical lower limb ischemia (HCC)   Bradycardia   Acute on chronic ischemia right lower extremity S/p thrombectomy, angioplasty and bypass -10/15 Dr. Randie Heinz S/p redo of right femoropopliteal bypass and angiogram - 10/17 Continue heparin drip -transition back to Xarelto once patient's hemoglobin is more stable Continue statin Vascular surgery following, outpatient follow-up as scheduled   Acute on chronic anemia questionably blood loss anemia on anemia of chronic disease  -Hemoglobin continues to fall, likely poorly responsive postoperatively given anemia of chronic disease CKD 3B and renal transplant. -1 unit PRBC pending today (previously required 2 units at intake)  CAD/CABG Stable, no anginal symptoms currently.  Continue core measures as above   AKI on CKD 3B Chronic metabolic acidosis H/o renal transplant -Mild,  continue to advance diet, decrease IV fluids now the patient is tolerating p.o. -Baseline creatinine 1.5, peak 2.0 -Continue mycophenolate, prednisone, tacrolimus Home regimen -Continue sodium bicarb supplement  Chronic combined systolic and diastolic CHF Hypertension Echocardiogram from February showed EF of 30 to 35%. Lasix and Cozaar on hold due to AKI. IV fluids discontinued as above   Type 2 diabetes mellitus A1c 6.4 on 07/16/2023 Continue sliding scale/hypoglycemic protocol   H/o DVT Patient was on Xarelto prior to admission which is currently on hold -IV heparin ongoing, transition back to Xarelto at discharge once hemoglobin stabilizes  DVT prophylaxis: SCD's Start: 07/19/23 1431 Code Status:   Code Status: Full Code Family Communication: None present  Status is: Inpatient  Dispo: The patient is from: Home              Anticipated d/c is to: To be determined              Anticipated d/c date is: 24 to 48 hours              Patient currently not medically stable for discharge  Consultants:  Vascular surgery  Procedures:  Thrombectomy angioplasty and bypass right femoral-popliteal artery  Antimicrobials:  Perioperatively  Subjective: No acute issues or events overnight, pain well-controlled denies nausea vomiting diarrhea constipation high fevers chills or chest pain  Objective: Vitals:   07/20/23 0000 07/20/23 0100 07/20/23 0200 07/20/23 0403  BP: (!) 156/83 116/67 132/70 138/77  Pulse: 76 (!) 57 68 65  Resp:   15 16  Temp:    97.8 F (36.6 C)  TempSrc:  Oral  SpO2: 100% 100% 99% 100%  Weight:      Height:        Intake/Output Summary (Last 24 hours) at 07/20/2023 0728 Last data filed at 07/20/2023 0636 Gross per 24 hour  Intake 370 ml  Output 2850 ml  Net -2480 ml   Filed Weights   07/16/23 0004 07/19/23 0641  Weight: 77.2 kg 77.1 kg    Examination:  General:  Pleasantly resting in bed, No acute distress. HEENT:  Normocephalic  atraumatic.  Sclerae nonicteric, noninjected.  Extraocular movements intact bilaterally. Neck:  Without mass or deformity.  Trachea is midline. Lungs:  Clear to auscultate bilaterally without rhonchi, wheeze, or rales. Heart:  Regular rate and rhythm.  Without murmurs, rubs, or gallops. Abdomen:  Soft, nontender, nondistended.  Without guarding or rebound. Extremities: Without cyanosis, clubbing, edema, or obvious deformity. Skin: Postprocedure incisions clean dry intact  Data Reviewed: I have personally reviewed following labs and imaging studies  CBC: Recent Labs  Lab 07/16/23 0423 07/16/23 1915 07/17/23 0310 07/18/23 0325 07/19/23 0524 07/19/23 1449 07/20/23 0412  WBC 6.6  --  10.3 8.9 8.9 8.1 8.2  NEUTROABS 4.4  --   --   --   --   --   --   HGB 12.4*   < > 11.5* 9.6* 9.2* 9.9* 7.1*  HCT 38.8*   < > 35.4* 29.4* 28.1* 31.0* 21.7*  MCV 85.5  --  86.8 88.6 87.0 88.8 86.5  PLT 241  --  195 153 153 137* 145*   < > = values in this interval not displayed.   Basic Metabolic Panel: Recent Labs  Lab 07/16/23 0423 07/16/23 0425 07/16/23 1915 07/16/23 2027 07/17/23 0310 07/18/23 0325 07/19/23 0524 07/20/23 0412  NA  --  140   < > 138 137 139 138 135  K  --  4.0   < > 4.6 4.3 4.0 4.3 4.2  CL  --  108  --   --  107 108 105 101  CO2  --  23  --   --  19* 21* 21* 21*  GLUCOSE  --  143*  --   --  189* 100* 96 119*  BUN  --  30*  --   --  23 33* 30* 31*  CREATININE  --  1.80*  --   --  1.89* 2.01* 1.89* 1.86*  CALCIUM  --  8.9  --   --  8.2* 8.7* 9.1 9.3  MG 1.7  --   --   --   --   --  1.7  --   PHOS 2.5  --   --   --   --   --   --   --    < > = values in this interval not displayed.   GFR: Estimated Creatinine Clearance: 38 mL/min (A) (by C-G formula based on SCr of 1.86 mg/dL (H)). Liver Function Tests: Recent Labs  Lab 07/16/23 0423 07/16/23 0425  AST 16 15  ALT 14 10  ALKPHOS 53 53  BILITOT 0.5 0.3  PROT 6.6 6.5  ALBUMIN 2.6* 2.7*   CBG: Recent Labs  Lab  07/19/23 1202 07/19/23 1617 07/19/23 2005 07/19/23 2358 07/20/23 0407  GLUCAP 126* 137* 159* 155* 136*   No results found for this or any previous visit (from the past 240 hour(s)).   Radiology Studies: DG C-Arm 1-60 Min  Result Date: 07/19/2023 CLINICAL DATA:  62 year old male status post fem-pop bypass graft EXAM:  DG C-ARM 1-60 MIN COMPARISON:  None Available. FINDINGS: Limited intraoperative fluoroscopic angiogram images of completion exam after right fem-pop bypass. Images demonstrate patent graft to the distal popliteal artery, and tibial arteries at the ankle. IMPRESSION: Limited intraoperative fluoroscopic angiogram completion images of right fem-pop bypass demonstrating patent graft and tibial arteries. Please refer to the dictated operative report for full details of intraoperative findings and procedure. Electronically Signed   By: Gilmer Mor D.O.   On: 07/19/2023 13:24    Scheduled Meds:  aspirin EC  81 mg Oral Q0600   Chlorhexidine Gluconate Cloth  6 each Topical Daily   docusate sodium  100 mg Oral Daily   insulin aspart  0-6 Units Subcutaneous Q4H   mupirocin ointment   Nasal BID   mycophenolate  720 mg Oral BID   nicotine  14 mg Transdermal Daily   pantoprazole  40 mg Oral q AM   predniSONE  5 mg Oral Daily   pregabalin  75 mg Oral BID   rosuvastatin  5 mg Oral Daily   sodium bicarbonate  1,300 mg Oral BID   tacrolimus  3 mg Oral q morning   And   tacrolimus  2 mg Oral QHS   tamsulosin  0.4 mg Oral QHS   Continuous Infusions:  sodium chloride     sodium chloride 40 mL/hr at 07/19/23 1501   heparin 500 Units/hr (07/19/23 1505)   magnesium sulfate bolus IVPB       LOS: 5 days   Time spent:  Azucena Fallen, DO [facility]  If 7PM-7AM, please contact night-coverage [email or web address]  07/20/2023, 7:28 AM

## 2023-07-20 NOTE — Plan of Care (Signed)

## 2023-07-21 DIAGNOSIS — I998 Other disorder of circulatory system: Secondary | ICD-10-CM | POA: Diagnosis not present

## 2023-07-21 LAB — BASIC METABOLIC PANEL
Anion gap: 10 (ref 5–15)
BUN: 29 mg/dL — ABNORMAL HIGH (ref 8–23)
CO2: 20 mmol/L — ABNORMAL LOW (ref 22–32)
Calcium: 8.4 mg/dL — ABNORMAL LOW (ref 8.9–10.3)
Chloride: 106 mmol/L (ref 98–111)
Creatinine, Ser: 1.63 mg/dL — ABNORMAL HIGH (ref 0.61–1.24)
GFR, Estimated: 47 mL/min — ABNORMAL LOW (ref >60)
Glucose, Bld: 164 mg/dL — ABNORMAL HIGH (ref 70–99)
Potassium: 3.8 mmol/L (ref 3.5–5.1)
Sodium: 136 mmol/L (ref 135–145)

## 2023-07-21 LAB — TYPE AND SCREEN
ABO/RH(D): O POS
Antibody Screen: NEGATIVE
Unit division: 0

## 2023-07-21 LAB — CBC
HCT: 22.7 % — ABNORMAL LOW (ref 39.0–52.0)
Hemoglobin: 7.3 g/dL — ABNORMAL LOW (ref 13.0–17.0)
MCH: 28.9 pg (ref 26.0–34.0)
MCHC: 32.2 g/dL (ref 30.0–36.0)
MCV: 89.7 fL (ref 80.0–100.0)
Platelets: 173 10*3/uL (ref 150–400)
RBC: 2.53 MIL/uL — ABNORMAL LOW (ref 4.22–5.81)
RDW: 15.9 % — ABNORMAL HIGH (ref 11.5–15.5)
WBC: 7 10*3/uL (ref 4.0–10.5)
nRBC: 0 % (ref 0.0–0.2)

## 2023-07-21 LAB — GLUCOSE, CAPILLARY
Glucose-Capillary: 134 mg/dL — ABNORMAL HIGH (ref 70–99)
Glucose-Capillary: 114 mg/dL — ABNORMAL HIGH (ref 70–99)
Glucose-Capillary: 133 mg/dL — ABNORMAL HIGH (ref 70–99)
Glucose-Capillary: 100 mg/dL — ABNORMAL HIGH (ref 70–99)
Glucose-Capillary: 100 mg/dL — ABNORMAL HIGH (ref 70–99)

## 2023-07-21 LAB — HEPARIN LEVEL (UNFRACTIONATED)
Heparin Unfractionated: <0.1 [IU]/mL — ABNORMAL LOW (ref 0.30–0.70)
Heparin Unfractionated: 0.19 [IU]/mL — ABNORMAL LOW (ref 0.30–0.70)

## 2023-07-21 LAB — BPAM RBC
Blood Product Expiration Date: 202411162359
ISSUE DATE / TIME: 202410192244
Unit Type and Rh: 5100

## 2023-07-21 LAB — APTT: aPTT: 59 s — ABNORMAL HIGH (ref 24–36)

## 2023-07-21 NOTE — Progress Notes (Signed)
PHARMACY - ANTICOAGULATION CONSULT NOTE  Pharmacy Consult for IV heparin Indication: critical limb ischemia   Allergies  Allergen Reactions   Percocet [Oxycodone-Acetaminophen] Hives and Itching   Vicodin [Hydrocodone-Acetaminophen] Hives and Itching   Shellfish Allergy Hives   Chocolate Flavor Hives   Chocolate Hives   Tomato Hives    Patient Measurements: Height: 5\' 7"  (170.2 cm) Weight: 77.1 kg (170 lb) IBW/kg (Calculated) : 66.1 Heparin Dosing Weight: 77.2 kg  Vital Signs: Temp: 98.6 F (37 C) (10/20 1605) Temp Source: Oral (10/20 1605) BP: 168/93 (10/20 1605) Pulse Rate: 75 (10/20 1605)  Labs: Recent Labs    07/19/23 0524 07/19/23 1449 07/20/23 0412 07/21/23 0319 07/21/23 1348 07/21/23 1755  HGB 9.2* 9.9* 7.1*  --  7.3*  --   HCT 28.1* 31.0* 21.7*  --  22.7*  --   PLT 153 137* 145*  --  173  --   APTT  --   --  41* 59*  --   --   HEPARINUNFRC 0.24*  --  <0.10* <0.10*  --  0.19*  CREATININE 1.89*  --  1.86* 1.63*  --   --     Estimated Creatinine Clearance: 43.4 mL/min (A) (by C-G formula based on SCr of 1.63 mg/dL (H)).   Medical History: Past Medical History:  Diagnosis Date   Anxiety    Arthritis    "lower back" (11/28/2017)   CAD (coronary artery disease)    Chronic lower back pain    H/O immunosuppressive therapy    chronic/notes 11/28/2017   History of gout    "before kidney transport" (11/28/2017)   Hyperlipidemia    Hypertension    Kidney disease    s/p kidney transplant 2011; "not on dialysis now" (11/28/2017)   Tobacco use     Assessment: Theodore Arellano is a 62 y.o. year old male admitted on 07/15/2023 with concern for critical limb ischemia. Xarelto 2.5mg  prior to admission (LD 10/14 @ 0830). Pharmacy consulted to dose heparin.  The patient is s/p thrombectomy 10/15 and s/p right popliteal bypass graft on 10/18 (twice, rethrombosed after 1st attempt).  Per PA's note 10/20, the patient is stable to resume full dose heparin. Will resume  at the previous rate and monitor closely. No bolus at this time. CBC trending down but some blood loss expected given recent procedures.   Heparin level 0.19 (subtherapeutic) on infusion at 1150 units/hr. No issues with line or bleeding reported per RN. Hgb 7.3, s/p PRBC x 1 10/20.  Goal of Therapy:  Heparin level 0.3-0.7 units/ml Monitor platelets by anticoagulation protocol: Yes   Plan:  Increase heparin to 1300 units/h Check heparin level in 6 hours   Christoper Fabian, PharmD, BCPS Please see amion for complete clinical pharmacist phone list 07/21/2023 7:18 PM

## 2023-07-21 NOTE — Progress Notes (Addendum)
Mobility Specialist Progress Note:   07/21/23 1616  Mobility  Activity Ambulated with assistance in hallway  Level of Assistance Minimal assist, patient does 75% or more  Assistive Device Front wheel walker  Distance Ambulated (ft) 90 ft  Activity Response Tolerated well  Mobility Referral Yes  $Mobility charge 1 Mobility  Mobility Specialist Start Time (ACUTE ONLY) 1555  Mobility Specialist Stop Time (ACUTE ONLY) 1610  Mobility Specialist Time Calculation (min) (ACUTE ONLY) 15 min   Pt received in bed, agreeable to mobility. MinA to stand. CG during ambulation. Pt c/o RLE numbness, otherwise asymptomatic throughout. Pt returned to bed with call bell in reach and all needs met. RN notified.  Leory Plowman  Mobility Specialist Please contact via [employer] office at [phone]

## 2023-07-21 NOTE — Progress Notes (Addendum)
  Progress Note    07/21/2023 9:36 AM 2 Days Post-Op  Subjective:  R foot feels better   Vitals:   07/21/23 0745 07/21/23 0806  BP:  133/81  Pulse:  71  Resp:  17  Temp: 98.3 F (36.8 C) 97.7 F (36.5 C)  SpO2:  100%   Physical Exam: Lungs:  non labored Incisions:  R chest, groin and lower leg incision c/d/i Extremities:  brisk R DP and PT by doppler Abdomen:  soft, NT Neurologic: A&O  CBC    Component Value Date/Time   WBC 8.2 07/20/2023 0412   RBC 2.51 (L) 07/20/2023 0412   HGB 7.1 (L) 07/20/2023 0412   HCT 21.7 (L) 07/20/2023 0412   PLT 145 (L) 07/20/2023 0412   MCV 86.5 07/20/2023 0412   MCH 28.3 07/20/2023 0412   MCHC 32.7 07/20/2023 0412   RDW 16.0 (H) 07/20/2023 0412   LYMPHSABS 1.5 07/16/2023 0423   MONOABS 0.5 07/16/2023 0423   EOSABS 0.2 07/16/2023 0423   BASOSABS 0.0 07/16/2023 0423    BMET    Component Value Date/Time   NA 136 07/21/2023 0319   K 3.8 07/21/2023 0319   CL 106 07/21/2023 0319   CO2 20 (L) 07/21/2023 0319   GLUCOSE 164 (H) 07/21/2023 0319   BUN 29 (H) 07/21/2023 0319   CREATININE 1.63 (H) 07/21/2023 0319   CALCIUM 8.4 (L) 07/21/2023 0319   GFRNONAA 47 (L) 07/21/2023 0319   GFRAA >60 01/13/2020 0248    INR    Component Value Date/Time   INR 1.6 (H) 01/12/2020 0656     Intake/Output Summary (Last 24 hours) at 07/21/2023 0936 Last data filed at 07/21/2023 0824 Gross per 24 hour  Intake 536 ml  Output 300 ml  Net 236 ml     Assessment/Plan:  62 y.o. male is s/p right popliteal and TP trunk thrombectomy and endarterectomy with bovine patch angioplasty as well as bypass from the profunda to the below the knee popliteal artery with PTFE  2 Days Post-Op   R foot well perfused with brisk DP and PT by doppler Incisions are well appearing Transfused 1u pRBC; check CBC this morning Can increase heparin to therapeutic dose; plan is for DOAC at discharge PT/OT recommending SNF; ok for discharge when bed approved and  available   Emilie Rutter, PA-C [facility] [phone] 07/21/2023 9:36 AM  I have seen and evaluated the patient. I agree with the PA note as documented above.  Right groin and right leg incisions look good.  He has a brisk DP Doppler signal in the right foot.  Bypass appears patent.  PT OT recommending SNF.  Cephus Shelling, MD [facility] [place] Office: [phone]

## 2023-07-21 NOTE — Progress Notes (Signed)
PHARMACY - ANTICOAGULATION CONSULT NOTE  Pharmacy Consult for IV heparin Indication: critical limb ischemia   Allergies  Allergen Reactions   Percocet [Oxycodone-Acetaminophen] Hives and Itching   Vicodin [Hydrocodone-Acetaminophen] Hives and Itching   Shellfish Allergy Hives   Chocolate Flavor Hives   Chocolate Hives   Tomato Hives    Patient Measurements: Height: 5\' 7"  (170.2 cm) Weight: 77.1 kg (170 lb) IBW/kg (Calculated) : 66.1 Heparin Dosing Weight: 77.2 kg  Vital Signs: Temp: 97.7 F (36.5 C) (10/20 0806) Temp Source: Oral (10/20 0806) BP: 133/81 (10/20 0806) Pulse Rate: 71 (10/20 0806)  Labs: Recent Labs    07/19/23 0524 07/19/23 1449 07/20/23 0412 07/21/23 0319  HGB 9.2* 9.9* 7.1*  --   HCT 28.1* 31.0* 21.7*  --   PLT 153 137* 145*  --   APTT  --   --  41* 59*  HEPARINUNFRC 0.24*  --  <0.10* <0.10*  CREATININE 1.89*  --  1.86* 1.63*    Estimated Creatinine Clearance: 43.4 mL/min (A) (by C-G formula based on SCr of 1.63 mg/dL (H)).   Medical History: Past Medical History:  Diagnosis Date   Anxiety    Arthritis    "lower back" (11/28/2017)   CAD (coronary artery disease)    Chronic lower back pain    H/O immunosuppressive therapy    chronic/notes 11/28/2017   History of gout    "before kidney transport" (11/28/2017)   Hyperlipidemia    Hypertension    Kidney disease    s/p kidney transplant 2011; "not on dialysis now" (11/28/2017)   Tobacco use     Assessment: PATRYK WILKS is a 62 y.o. year old male admitted on 07/15/2023 with concern for critical limb ischemia. Xarelto 2.5mg  prior to admission (LD 10/14 @ 0830). Pharmacy consulted to dose heparin.  The patient is s/p thrombectomy 10/15 and s/p right popliteal bypass graft on 10/18 (twice, rethrombosed after 1st attempt).   Per PA's note this morning 10/20, the patient is stable to resume full dose heparin. Will resume at the previous rate and monitor closely. No bolus at this time.CBC  trending down but some blood loss expected given recent procedures. Will measure anti-xa only as patient has not had DOAC exposure since 10/14.    Goal of Therapy:  Heparin level 0.3-0.7 units/ml aPTT: 66-102 Monitor platelets by anticoagulation protocol: Yes   Plan:  Increase heparin to 1150 units/h Check heparin level in 6 hours  Measure daily heparin level and CBC  Monitor for bleeding  F/u plan to switch to DOAC for discharge   Blane Ohara, PharmD  PGY2 Pharmacy Resident

## 2023-07-21 NOTE — Progress Notes (Signed)
PROGRESS NOTE    Theodore Arellano  ZOX:[id number] DOB: [birth year] DOA: 07/15/2023 PCP: [name], MD   Brief Narrative:  Theodore Arellano is a 62 y.o. male with PMH significant for DM2, HTN, HLD, CAD/CABG, systolic CHF, CKD stage 3b h/o renal transplant,  PAD with h/o critical lower limb ischemia in 2022, legally blind in right eye,  h/o DVT on xarelto, h/o cocaine abuse, GERD, gout, glaucoma.  Patient presented with pain in the right lower extremity ongoing for about 3 weeks.  Concern was for vascular compromise.  Patient was hospitalized for further management.  Vascular surgery was consulted.   Patient admitted as above with worsening lower extremity pain noted to have acute on chronic ischemia secondary to profound thrombus -now status post intervention with vascular surgery as below tolerated procedure quite well transition back to Xarelto from heparin with ultimate plans for discharge to rehab given ongoing acute ambulatory dysfunction.  Assessment & Plan:   Principal Problem:   Vascular insufficiency of extremity Active Problems:   DM (diabetes mellitus) type II, controlled, with peripheral vascular disorder (HCC)   HTN (hypertension)   Chronic systolic CHF (congestive heart failure) (HCC)   Coronary artery disease   Renal transplant recipient   HLD (hyperlipidemia)   Acute kidney injury (HCC)   History of DVT (deep vein thrombosis)   CKD (chronic kidney disease)   Tobacco abuse   Critical lower limb ischemia (HCC)   Bradycardia  Acute on chronic ischemia right lower extremity S/p thrombectomy, angioplasty and bypass -10/15 Dr. Randie Heinz S/p redo of right femoropopliteal bypass and angiogram - 10/17 Continue heparin drip -transition back to Xarelto once patient's hemoglobin is more stable; continue statin; Vascular surgery following, outpatient follow-up as scheduled   Acute on chronic anemia questionably blood loss anemia on anemia of chronic disease  -Hemoglobin  continues to fall, likely poorly responsive postoperatively given anemia of chronic disease CKD 3B and renal transplant. - 3 unit PRBC transfused since admission, follow repeat labs  CAD/CABG Stable, no anginal symptoms currently.  Continue core measures as above   AKI on CKD 3B Chronic metabolic acidosis H/o renal transplant -Mild, continue to advance diet, decrease IV fluids now the patient is tolerating p.o. -Baseline creatinine 1.5, peak 2.0 -Continue mycophenolate, prednisone, tacrolimus Home regimen -Continue sodium bicarb supplement  Chronic combined systolic and diastolic CHF Hypertension Echocardiogram from February showed EF of 30 to 35%. Lasix and Cozaar on hold due to AKI. IV fluids discontinued as above   Type 2 diabetes mellitus A1c 6.4 on 07/16/2023 Continue sliding scale/hypoglycemic protocol   H/o DVT Patient was on Xarelto prior to admission which is currently on hold -IV heparin ongoing, transition back to Xarelto at discharge once hemoglobin stabilizes  DVT prophylaxis: SCD's Start: 07/19/23 1431 Code Status:   Code Status: Full Code Family Communication: None present  Status is: Inpatient  Dispo: The patient is from: Home              Anticipated d/c is to: SNF              Anticipated d/c date is: 24 to 48 hours              Patient currently not medically stable for discharge  Consultants:  Vascular surgery  Procedures:  Thrombectomy angioplasty and bypass right femoral-popliteal artery  Antimicrobials:  Perioperatively  Subjective: No acute issues or events overnight, pain well-controlled denies nausea vomiting diarrhea constipation high fevers chills or chest pain  Objective:  Vitals:   07/20/23 2033 07/20/23 2235 07/20/23 2311 07/21/23 0143  BP: (!) 158/80 133/75 (!) 145/84 (!) 151/88  Pulse: 71  68 76  Resp: 16  15 15   Temp: 98 F (36.7 C) 98.2 F (36.8 C) 97.8 F (36.6 C) 98.2 F (36.8 C)  TempSrc: Oral Oral Oral Oral  SpO2:  100%  100% 100%  Weight:      Height:        Intake/Output Summary (Last 24 hours) at 07/21/2023 0708 Last data filed at 07/21/2023 0144 Gross per 24 hour  Intake 336 ml  Output 1725 ml  Net -1389 ml   Filed Weights   07/16/23 0004 07/19/23 0641  Weight: 77.2 kg 77.1 kg    Examination:  General:  Pleasantly resting in bed, No acute distress. HEENT:  Normocephalic atraumatic.  Sclerae nonicteric, noninjected.  Extraocular movements intact bilaterally. Neck:  Without mass or deformity.  Trachea is midline. Lungs:  Clear to auscultate bilaterally without rhonchi, wheeze, or rales. Heart:  Regular rate and rhythm.  Without murmurs, rubs, or gallops. Abdomen:  Soft, nontender, nondistended.  Without guarding or rebound. Extremities: Without cyanosis, clubbing, edema, or obvious deformity. Skin: Postprocedure incisions clean dry intact  Data Reviewed: I have personally reviewed following labs and imaging studies  CBC: Recent Labs  Lab 07/16/23 0423 07/16/23 1915 07/17/23 0310 07/18/23 0325 07/19/23 0524 07/19/23 1449 07/20/23 0412  WBC 6.6  --  10.3 8.9 8.9 8.1 8.2  NEUTROABS 4.4  --   --   --   --   --   --   HGB 12.4*   < > 11.5* 9.6* 9.2* 9.9* 7.1*  HCT 38.8*   < > 35.4* 29.4* 28.1* 31.0* 21.7*  MCV 85.5  --  86.8 88.6 87.0 88.8 86.5  PLT 241  --  195 153 153 137* 145*   < > = values in this interval not displayed.   Basic Metabolic Panel: Recent Labs  Lab 07/16/23 0423 07/16/23 0425 07/17/23 0310 07/18/23 0325 07/19/23 0524 07/20/23 0412 07/21/23 0319  NA  --    < > 137 139 138 135 136  K  --    < > 4.3 4.0 4.3 4.2 3.8  CL  --    < > 107 108 105 101 106  CO2  --    < > 19* 21* 21* 21* 20*  GLUCOSE  --    < > 189* 100* 96 119* 164*  BUN  --    < > 23 33* 30* 31* 29*  CREATININE  --    < > 1.89* 2.01* 1.89* 1.86* 1.63*  CALCIUM  --    < > 8.2* 8.7* 9.1 9.3 8.4*  MG 1.7  --   --   --  1.7  --   --   PHOS 2.5  --   --   --   --   --   --    < > = values  in this interval not displayed.   GFR: Estimated Creatinine Clearance: 43.4 mL/min (A) (by C-G formula based on SCr of 1.63 mg/dL (H)). Liver Function Tests: Recent Labs  Lab 07/16/23 0423 07/16/23 0425  AST 16 15  ALT 14 10  ALKPHOS 53 53  BILITOT 0.5 0.3  PROT 6.6 6.5  ALBUMIN 2.6* 2.7*   CBG: Recent Labs  Lab 07/20/23 1147 07/20/23 1552 07/20/23 2029 07/20/23 2348 07/21/23 0411  GLUCAP 91 152* 122* 144* 134*   No results found for  this or any previous visit (from the past 240 hour(s)).   Radiology Studies: DG C-Arm 1-60 Min  Result Date: 07/19/2023 CLINICAL DATA:  62 year old male status post fem-pop bypass graft EXAM: DG C-ARM 1-60 MIN COMPARISON:  None Available. FINDINGS: Limited intraoperative fluoroscopic angiogram images of completion exam after right fem-pop bypass. Images demonstrate patent graft to the distal popliteal artery, and tibial arteries at the ankle. IMPRESSION: Limited intraoperative fluoroscopic angiogram completion images of right fem-pop bypass demonstrating patent graft and tibial arteries. Please refer to the dictated operative report for full details of intraoperative findings and procedure. Electronically Signed   By: Gilmer Mor D.O.   On: 07/19/2023 13:24    Scheduled Meds:  aspirin EC  81 mg Oral Q0600   Chlorhexidine Gluconate Cloth  6 each Topical Daily   docusate sodium  100 mg Oral Daily   insulin aspart  0-6 Units Subcutaneous Q4H   mycophenolate  720 mg Oral BID   nicotine  14 mg Transdermal Daily   pantoprazole  40 mg Oral q AM   predniSONE  5 mg Oral Daily   pregabalin  75 mg Oral BID   rosuvastatin  5 mg Oral Daily   sodium bicarbonate  1,300 mg Oral BID   tacrolimus  3 mg Oral q morning   And   tacrolimus  2 mg Oral QHS   tamsulosin  0.4 mg Oral QHS   Continuous Infusions:  heparin 500 Units/hr (07/19/23 1505)   magnesium sulfate bolus IVPB       LOS: 6 days   Time spent:  Azucena Fallen, DO [facility]  If 7PM-7AM, please contact night-coverage [email or web address]  07/21/2023, 7:08 AM

## 2023-07-21 NOTE — Plan of Care (Signed)
  Problem: Education: Goal: Ability to describe self-care measures that may prevent or decrease complications (Diabetes Survival Skills Education) will improve Outcome: Progressing Goal: Individualized Educational Video(s) Outcome: Progressing   Problem: Coping: Goal: Ability to adjust to condition or change in health will improve Outcome: Progressing   Problem: Fluid Volume: Goal: Ability to maintain a balanced intake and output will improve Outcome: Progressing   Problem: Health Behavior/Discharge Planning: Goal: Ability to identify and utilize available resources and services will improve Outcome: Progressing Goal: Ability to manage health-related needs will improve Outcome: Progressing   Problem: Metabolic: Goal: Ability to maintain appropriate glucose levels will improve Outcome: Progressing   Problem: Nutritional: Goal: Maintenance of adequate nutrition will improve Outcome: Progressing Goal: Progress toward achieving an optimal weight will improve Outcome: Progressing   Problem: Skin Integrity: Goal: Risk for impaired skin integrity will decrease Outcome: Progressing   Problem: Tissue Perfusion: Goal: Adequacy of tissue perfusion will improve Outcome: Progressing   Problem: Education: Goal: Knowledge of General Education information will improve Description: Including pain rating scale, medication(s)/side effects and non-pharmacologic comfort measures Outcome: Progressing   Problem: Health Behavior/Discharge Planning: Goal: Ability to manage health-related needs will improve Outcome: Progressing   Problem: Clinical Measurements: Goal: Ability to maintain clinical measurements within normal limits will improve Outcome: Progressing Goal: Will remain free from infection Outcome: Progressing Goal: Diagnostic test results will improve Outcome: Progressing Goal: Respiratory complications will improve Outcome: Progressing Goal: Cardiovascular complication will  be avoided Outcome: Progressing   Problem: Activity: Goal: Risk for activity intolerance will decrease Outcome: Progressing   Problem: Nutrition: Goal: Adequate nutrition will be maintained Outcome: Progressing   Problem: Coping: Goal: Level of anxiety will decrease Outcome: Progressing   Problem: Elimination: Goal: Will not experience complications related to bowel motility Outcome: Progressing Goal: Will not experience complications related to urinary retention Outcome: Progressing   Problem: Pain Managment: Goal: General experience of comfort will improve Outcome: Progressing   Problem: Safety: Goal: Ability to remain free from injury will improve Outcome: Progressing   Problem: Skin Integrity: Goal: Risk for impaired skin integrity will decrease Outcome: Progressing   Problem: Education: Goal: Knowledge of prescribed regimen will improve Outcome: Progressing   Problem: Activity: Goal: Ability to tolerate increased activity will improve Outcome: Progressing   Problem: Clinical Measurements: Goal: Postoperative complications will be avoided or minimized Outcome: Progressing Goal: Signs and symptoms of graft occlusion will improve Outcome: Progressing   Problem: Skin Integrity: Goal: Demonstration of wound healing without infection will improve Outcome: Progressing

## 2023-07-21 NOTE — TOC Progression Note (Addendum)
Transition of Care [facility]) - Progression Note    Patient Details  Name: Theodore Arellano MRN: [id number] Date of Birth: [birth year]  Transition of Care [facility]) CM/SW Contact  Patrice Paradise, LCSW Phone Number: 07/21/2023, 4:14 PM  Clinical Narrative:     CSW met with pt and provided bed offers. Pt chose [facility]. Weekday CSW would need to confirm bed on Monday.  TOC team will continue to assist with discharge planning needs.    Expected Discharge Plan: Skilled Nursing Facility Barriers to Discharge: Continued Medical Work up  Expected Discharge Plan and Services In-house Referral: Clinical Social Work     Living arrangements for the past 2 months: Apartment                                       Social Determinants of Health (SDOH) Interventions SDOH Screenings   Food Insecurity: No Food Insecurity (07/16/2023)  Housing: Low Risk  (07/16/2023)  Transportation Needs: Unmet Transportation Needs (07/16/2023)  Utilities: Not At Risk (07/16/2023)  Tobacco Use: High Risk (07/19/2023)    Readmission Risk Interventions     No data to display

## 2023-07-21 NOTE — Plan of Care (Signed)

## 2023-07-22 DIAGNOSIS — I998 Other disorder of circulatory system: Secondary | ICD-10-CM | POA: Diagnosis not present

## 2023-07-22 LAB — BASIC METABOLIC PANEL
Anion gap: 8 (ref 5–15)
BUN: 30 mg/dL — ABNORMAL HIGH (ref 8–23)
CO2: 24 mmol/L (ref 22–32)
Calcium: 9 mg/dL (ref 8.9–10.3)
Chloride: 107 mmol/L (ref 98–111)
Creatinine, Ser: 1.58 mg/dL — ABNORMAL HIGH (ref 0.61–1.24)
GFR, Estimated: 49 mL/min — ABNORMAL LOW (ref >60)
Glucose, Bld: 89 mg/dL (ref 70–99)
Potassium: 4 mmol/L (ref 3.5–5.1)
Sodium: 139 mmol/L (ref 135–145)

## 2023-07-22 LAB — HEPARIN LEVEL (UNFRACTIONATED)
Heparin Unfractionated: 0.18 [IU]/mL — ABNORMAL LOW (ref 0.30–0.70)
Heparin Unfractionated: 0.35 [IU]/mL (ref 0.30–0.70)

## 2023-07-22 LAB — GLUCOSE, CAPILLARY
Glucose-Capillary: 100 mg/dL — ABNORMAL HIGH (ref 70–99)
Glucose-Capillary: 105 mg/dL — ABNORMAL HIGH (ref 70–99)
Glucose-Capillary: 74 mg/dL (ref 70–99)
Glucose-Capillary: 88 mg/dL (ref 70–99)
Glucose-Capillary: 93 mg/dL (ref 70–99)

## 2023-07-22 LAB — CBC
HCT: 24.5 % — ABNORMAL LOW (ref 39.0–52.0)
Hemoglobin: 7.9 g/dL — ABNORMAL LOW (ref 13.0–17.0)
MCH: 28.5 pg (ref 26.0–34.0)
MCHC: 32.2 g/dL (ref 30.0–36.0)
MCV: 88.4 fL (ref 80.0–100.0)
Platelets: 215 10*3/uL (ref 150–400)
RBC: 2.77 MIL/uL — ABNORMAL LOW (ref 4.22–5.81)
RDW: 15.6 % — ABNORMAL HIGH (ref 11.5–15.5)
WBC: 8.2 10*3/uL (ref 4.0–10.5)
nRBC: 0 % (ref 0.0–0.2)

## 2023-07-22 MED ORDER — RIVAROXABAN 2.5 MG PO TABS
2.5000 mg | ORAL_TABLET | Freq: Two times a day (BID) | ORAL | Status: DC
  Administered 2023-07-22 – 2023-07-23 (×2): 2.5 mg via ORAL
  Filled 2023-07-22 (×3): qty 1

## 2023-07-22 NOTE — Progress Notes (Signed)
Mobility Specialist Progress Note:   07/22/23 1505  Mobility  Activity Ambulated with assistance in hallway  Level of Assistance Contact guard assist, steadying assist  Assistive Device Front wheel walker  Distance Ambulated (ft) 1505 ft  Activity Response Tolerated well  Mobility Referral Yes  $Mobility charge 1 Mobility  Mobility Specialist Start Time (ACUTE ONLY) 1505  Mobility Specialist Stop Time (ACUTE ONLY) 1513  Mobility Specialist Time Calculation (min) (ACUTE ONLY) 8 min   Pt received in bed, agreeable to mobility session. Tolerated well, VSS throughout. Audibel SOB near EOS. Returned pt to bed, all needs met, call bell in reach.   Feliciana Rossetti Mobility Specialist Please contact via Special educational needs teacher or  Rehab office at [phone]

## 2023-07-22 NOTE — Progress Notes (Signed)
PROGRESS NOTE    Theodore Arellano  GEX:[id number] DOB: Mar 29, 1961 DOA: 07/15/2023 PCP: [name], MD   Brief Narrative:  FREEDOM HUR is a 62 y.o. male with PMH significant for DM2, HTN, HLD, CAD/CABG, systolic CHF, CKD stage 3b h/o renal transplant,  PAD with h/o critical lower limb ischemia in 2022, legally blind in right eye,  h/o DVT on xarelto, h/o cocaine abuse, GERD, gout, glaucoma.  Patient presented with pain in the right lower extremity ongoing for about 3 weeks.  Concern was for vascular compromise.  Patient was hospitalized for further management.  Vascular surgery was consulted.   Patient admitted as above with worsening lower extremity pain noted to have acute on chronic ischemia secondary to profound thrombus -now status post intervention with vascular surgery as below tolerated procedure quite well transition back to Xarelto from heparin with ultimate plans for discharge to rehab given ongoing acute ambulatory dysfunction.  Assessment & Plan:   Principal Problem:   Vascular insufficiency of extremity Active Problems:   DM (diabetes mellitus) type II, controlled, with peripheral vascular disorder (HCC)   HTN (hypertension)   Chronic systolic CHF (congestive heart failure) (HCC)   Coronary artery disease   Renal transplant recipient   HLD (hyperlipidemia)   Acute kidney injury (HCC)   History of DVT (deep vein thrombosis)   CKD (chronic kidney disease)   Tobacco abuse   Critical lower limb ischemia (HCC)   Bradycardia  Acute on chronic ischemia right lower extremity S/p thrombectomy, angioplasty and bypass -10/15 Dr. Randie Heinz S/p redo of right femoropopliteal bypass and angiogram - 10/17 Continue heparin drip -transition back to Xarelto once patient's hemoglobin is more stable; continue statin; Vascular surgery following, outpatient follow-up as scheduled   Acute on chronic anemia questionably blood loss anemia on anemia of chronic disease  -Hemoglobin  continues to fall, likely poorly responsive postoperatively given anemia of chronic disease CKD 3B and renal transplant. - 3 unit PRBC transfused since admission, follow repeat labs  CAD/CABG Stable, no anginal symptoms currently.  Continue core measures as above   AKI on CKD 3B Chronic metabolic acidosis H/o renal transplant -Mild, continue to advance diet, decrease IV fluids now the patient is tolerating p.o. -Baseline creatinine 1.5, peak 2.0 -Continue mycophenolate, prednisone, tacrolimus Home regimen -Continue sodium bicarb supplement  Chronic combined systolic and diastolic CHF Hypertension Echocardiogram from February showed EF of 30 to 35%. Lasix and Cozaar on hold due to AKI. IV fluids discontinued as above   Type 2 diabetes mellitus A1c 6.4 on 07/16/2023 Continue sliding scale/hypoglycemic protocol   H/o DVT Patient was on Xarelto prior to admission which is currently on hold -IV heparin ongoing, transition back to Xarelto at discharge once hemoglobin stabilizes  DVT prophylaxis: SCD's Start: 07/19/23 1431 Code Status:   Code Status: Full Code Family Communication: None present  Status is: Inpatient  Dispo: The patient is from: Home              Anticipated d/c is to: SNF              Anticipated d/c date is: 24 to 48 hours              Patient currently not medically stable for discharge  Consultants:  Vascular surgery  Procedures:  Thrombectomy angioplasty and bypass right femoral-popliteal artery  Antimicrobials:  Perioperatively  Subjective: No acute issues or events overnight, pain well-controlled denies nausea vomiting diarrhea constipation high fevers chills or chest pain  Objective:  Vitals:   07/21/23 1605 07/21/23 2000 07/22/23 0000 07/22/23 0454  BP: (!) 168/93 (!) 165/94 (!) 147/86 (!) 151/83  Pulse: 75 70  64  Resp: 17 20 17 15   Temp: 98.6 F (37 C) 98.7 F (37.1 C)  97.7 F (36.5 C)  TempSrc: Oral   Oral  SpO2: 100% 100% 100% 100%   Weight:      Height:        Intake/Output Summary (Last 24 hours) at 07/22/2023 0742 Last data filed at 07/21/2023 1500 Gross per 24 hour  Intake 468.83 ml  Output 200 ml  Net 268.83 ml   Filed Weights   07/16/23 0004 07/19/23 0641  Weight: 77.2 kg 77.1 kg    Examination:  General:  Pleasantly resting in bed, No acute distress. HEENT:  Normocephalic atraumatic.  Sclerae nonicteric, noninjected.  Extraocular movements intact bilaterally. Neck:  Without mass or deformity.  Trachea is midline. Lungs:  Clear to auscultate bilaterally without rhonchi, wheeze, or rales. Heart:  Regular rate and rhythm.  Without murmurs, rubs, or gallops. Abdomen:  Soft, nontender, nondistended.  Without guarding or rebound. Extremities: Without cyanosis, clubbing, edema, or obvious deformity. Skin: Postprocedure incisions clean dry intact  Data Reviewed: I have personally reviewed following labs and imaging studies  CBC: Recent Labs  Lab 07/16/23 0423 07/16/23 1915 07/19/23 0524 07/19/23 1449 07/20/23 0412 07/21/23 1348 07/22/23 0345  WBC 6.6   < > 8.9 8.1 8.2 7.0 8.2  NEUTROABS 4.4  --   --   --   --   --   --   HGB 12.4*   < > 9.2* 9.9* 7.1* 7.3* 7.9*  HCT 38.8*   < > 28.1* 31.0* 21.7* 22.7* 24.5*  MCV 85.5   < > 87.0 88.8 86.5 89.7 88.4  PLT 241   < > 153 137* 145* 173 215   < > = values in this interval not displayed.   Basic Metabolic Panel: Recent Labs  Lab 07/16/23 0423 07/16/23 0425 07/18/23 0325 07/19/23 0524 07/20/23 0412 07/21/23 0319 07/22/23 0345  NA  --    < > 139 138 135 136 139  K  --    < > 4.0 4.3 4.2 3.8 4.0  CL  --    < > 108 105 101 106 107  CO2  --    < > 21* 21* 21* 20* 24  GLUCOSE  --    < > 100* 96 119* 164* 89  BUN  --    < > 33* 30* 31* 29* 30*  CREATININE  --    < > 2.01* 1.89* 1.86* 1.63* 1.58*  CALCIUM  --    < > 8.7* 9.1 9.3 8.4* 9.0  MG 1.7  --   --  1.7  --   --   --   PHOS 2.5  --   --   --   --   --   --    < > = values in this interval  not displayed.   GFR: Estimated Creatinine Clearance: 44.7 mL/min (A) (by C-G formula based on SCr of 1.58 mg/dL (H)). Liver Function Tests: Recent Labs  Lab 07/16/23 0423 07/16/23 0425  AST 16 15  ALT 14 10  ALKPHOS 53 53  BILITOT 0.5 0.3  PROT 6.6 6.5  ALBUMIN 2.6* 2.7*   CBG: Recent Labs  Lab 07/21/23 1233 07/21/23 1604 07/21/23 2048 07/22/23 0024 07/22/23 0435  GLUCAP 133* 100* 100* 93 74   No results found  for this or any previous visit (from the past 240 hour(s)).   Radiology Studies: No results found.  Scheduled Meds:  aspirin EC  81 mg Oral Q0600   Chlorhexidine Gluconate Cloth  6 each Topical Daily   docusate sodium  100 mg Oral Daily   insulin aspart  0-6 Units Subcutaneous Q4H   mycophenolate  720 mg Oral BID   nicotine  14 mg Transdermal Daily   pantoprazole  40 mg Oral q AM   predniSONE  5 mg Oral Daily   pregabalin  75 mg Oral BID   rosuvastatin  5 mg Oral Daily   sodium bicarbonate  1,300 mg Oral BID   tacrolimus  3 mg Oral q morning   And   tacrolimus  2 mg Oral QHS   tamsulosin  0.4 mg Oral QHS   Continuous Infusions:  heparin 1,500 Units/hr (07/22/23 0455)   magnesium sulfate bolus IVPB       LOS: 7 days   Time spent:  Azucena Fallen, DO [facility]  If 7PM-7AM, please contact night-coverage [email or web address]  07/22/2023, 7:42 AM

## 2023-07-22 NOTE — TOC Progression Note (Signed)
Transition of Care [facility]) - Progression Note    Patient Details  Name: Theodore Arellano MRN: [id number] Date of Birth: 1960-11-01  Transition [facility] Care [facility]) CM/SW Contact  Eduard Roux, [place] Phone Number: 07/22/2023, 10:00 AM  Clinical Narrative:     [facility] has confirmed bed offer. CMA will start insurance auth.  TOC will continue to follow and assist with discharge planning.  Antony Blackbird, MSW, LCSW Clinical Social Worker    Expected Discharge Plan: Skilled Nursing Facility Barriers to Discharge: Continued Medical Work up  Expected Discharge Plan and Services In-house Referral: Clinical Social Work     Living arrangements for the past 2 months: Apartment                                       Social Determinants of Health (SDOH) Interventions SDOH Screenings   Food Insecurity: No Food Insecurity (07/16/2023)  Housing: Low Risk  (07/16/2023)  Transportation Needs: Unmet Transportation Needs (07/16/2023)  Utilities: Not At Risk (07/16/2023)  Tobacco Use: High Risk (07/19/2023)    Readmission Risk Interventions     No data to display

## 2023-07-22 NOTE — TOC Progression Note (Signed)
Transition of Care [facility]) - Progression Note    Patient Details  Name: [name] MRN: [id number] Date of Birth: 1961/06/01  Transition of Care [facility]) CM/SW Contact  Eduard Roux, [place] Phone Number: 07/22/2023, 3:25 PM  Clinical Narrative:     Insurance is still pending   Expected Discharge Plan: Skilled Nursing Facility Barriers to Discharge: Continued Medical Work up  Expected Discharge Plan and Services In-house Referral: Clinical Social Work     Living arrangements for the past 2 months: Apartment                                       Social Determinants of Health (SDOH) Interventions SDOH Screenings   Food Insecurity: No Food Insecurity (07/16/2023)  Housing: Low Risk  (07/16/2023)  Transportation Needs: Unmet Transportation Needs (07/16/2023)  Utilities: Not At Risk (07/16/2023)  Tobacco Use: High Risk (07/19/2023)    Readmission Risk Interventions     No data to display

## 2023-07-22 NOTE — Progress Notes (Signed)
Physical Therapy Treatment Patient Details Name: Theodore Arellano MRN: [id number] DOB: 21-Jan-1961 Today's Date: 07/22/2023   History of Present Illness Pt is a 62 y/o M admitted on 07/15/23 after presenting with c/o RLE pain. Pt is s/p R axillary-femoral, femoral popliteal embolectomy. PMH: HLD, HTN, CKD 3B, renal transplant, PAD, systolic CHF, legally blind in R eye, CAD s/p CABG, gout, cocaine abuse, glaucoma    PT Comments  Pt received in supine and agreeable to session. Pt reports improved RLE pain this session and demonstrates improved activity tolerance. Pt able to perform all mobility tasks with up to CGA for safety. Pt able to tolerate increased gait distance, however continues to demonstrate some instability due to RLE pain. Pt able to perform x5 serial STS with slight difficulty with first power up, but improving with further trials. Pt continues to benefit from PT services to progress toward functional mobility goals.     If plan is discharge home, recommend the following: A little help with walking and/or transfers;A little help with bathing/dressing/bathroom;Assistance with cooking/housework;Assist for transportation;Help with stairs or ramp for entrance   Can travel by private vehicle     Yes  Equipment Recommendations  Rolling walker (2 wheels);BSC/3in1;Other (comment)    Recommendations for Other Services       Precautions / Restrictions Precautions Precautions: Fall Restrictions Weight Bearing Restrictions: No     Mobility  Bed Mobility Overal bed mobility: Needs Assistance Bed Mobility: Supine to Sit, Sit to Supine     Supine to sit: Supervision, HOB elevated, Used rails Sit to supine: Used rails, Supervision   General bed mobility comments: increased time/effort    Transfers Overall transfer level: Needs assistance Equipment used: Rolling walker (2 wheels), None Transfers: Sit to/from Stand Sit to Stand: Contact guard assist           General  transfer comment: STS from EOB x1 with RW and x5 serial STS with CGA for safety. Pt demonstrating improved power up with successive trials    Ambulation/Gait Ambulation/Gait assistance: Contact guard assist Gait Distance (Feet): 60 Feet Assistive device: Rolling walker (2 wheels) Gait Pattern/deviations: Decreased stride length, Decreased dorsiflexion - right, Decreased dorsiflexion - left, Step-to pattern, Trunk flexed, Decreased stance time - right, Antalgic Gait velocity: decreased     General Gait Details: slow, short steps with cues for increased LLE step length and upright posture. Pt demonstrating progressively flexed trunk and decreased LLE clearance with increased fatigue       Balance Overall balance assessment: Needs assistance Sitting-balance support: No upper extremity supported, Feet supported Sitting balance-Leahy Scale: Fair Sitting balance - Comments: sitting EOB   Standing balance support: Bilateral upper extremity supported, During functional activity, Reliant on assistive device for balance Standing balance-Leahy Scale: Poor Standing balance comment: with RW support                            Cognition Arousal: Alert Behavior During Therapy: WFL for tasks assessed/performed Overall Cognitive Status: No family/caregiver present to determine baseline cognitive functioning                                          Exercises      General Comments General comments (skin integrity, edema, etc.): VSS on RA      Pertinent Vitals/Pain Pain Assessment Pain Assessment: Faces Faces Pain Scale: Hurts  little more Pain Location: RLE Pain Descriptors / Indicators: Aching, Grimacing, Guarding, Burning, Numbness Pain Intervention(s): Limited activity within patient's tolerance, Monitored during session, Repositioned     PT Goals (current goals can now be found in the care plan section) Acute Rehab PT Goals Patient Stated Goal: get  better, for his leg to be okay PT Goal Formulation: With patient Time For Goal Achievement: 07/31/23 Progress towards PT goals: Progressing toward goals    Frequency    Min 1X/week       AM-PAC PT "6 Clicks" Mobility   Outcome Measure  Help needed turning from your back to your side while in a flat bed without using bedrails?: None Help needed moving from lying on your back to sitting on the side of a flat bed without using bedrails?: A Little Help needed moving to and from a bed to a chair (including a wheelchair)?: A Little Help needed standing up from a chair using your arms (e.g., wheelchair or bedside chair)?: A Little Help needed to walk in hospital room?: A Little Help needed climbing 3-5 steps with a railing? : A Lot 6 Click Score: 18    End of Session Equipment Utilized During Treatment: Gait belt Activity Tolerance: Patient limited by pain;Patient limited by fatigue Patient left: in bed;with call bell/phone within reach;with bed alarm set Nurse Communication: Mobility status PT Visit Diagnosis: Muscle weakness (generalized) (M62.81);Pain;Other abnormalities of gait and mobility (R26.89);Difficulty in walking, not elsewhere classified (R26.2)     Time: 2130-8657 PT Time Calculation (min) (ACUTE ONLY): 24 min  Charges:    $Gait Training: 8-22 mins $Therapeutic Activity: 8-22 mins PT General Charges $$ ACUTE PT VISIT: 1 Visit                    Johny Shock, PTA Acute Rehabilitation Services Secure Chat Preferred  [phone]    Johny Shock 07/22/2023, 9:52 AM

## 2023-07-22 NOTE — Progress Notes (Addendum)
  Progress Note    07/22/2023 8:19 AM 3 Days Post-Op  Subjective:  no complaints   Vitals:   07/22/23 0454 07/22/23 0700  BP: (!) 151/83 (!) 145/89  Pulse: 64 61  Resp: 15 20  Temp: 97.7 F (36.5 C)   SpO2: 100% 100%   Physical Exam:  Lungs:  non labored Incisions:  chest, groin, and calf incisions c/d/i Extremities:  faintly palpable R DP pulse Neurologic: A&O  CBC    Component Value Date/Time   WBC 8.2 07/22/2023 0345   RBC 2.77 (L) 07/22/2023 0345   HGB 7.9 (L) 07/22/2023 0345   HCT 24.5 (L) 07/22/2023 0345   PLT 215 07/22/2023 0345   MCV 88.4 07/22/2023 0345   MCH 28.5 07/22/2023 0345   MCHC 32.2 07/22/2023 0345   RDW 15.6 (H) 07/22/2023 0345   LYMPHSABS 1.5 07/16/2023 0423   MONOABS 0.5 07/16/2023 0423   EOSABS 0.2 07/16/2023 0423   BASOSABS 0.0 07/16/2023 0423    BMET    Component Value Date/Time   NA 139 07/22/2023 0345   K 4.0 07/22/2023 0345   CL 107 07/22/2023 0345   CO2 24 07/22/2023 0345   GLUCOSE 89 07/22/2023 0345   BUN 30 (H) 07/22/2023 0345   CREATININE 1.58 (H) 07/22/2023 0345   CALCIUM 9.0 07/22/2023 0345   GFRNONAA 49 (L) 07/22/2023 0345   GFRAA >60 01/13/2020 0248    INR    Component Value Date/Time   INR 1.6 (H) 01/12/2020 0656     Intake/Output Summary (Last 24 hours) at 07/22/2023 0819 Last data filed at 07/21/2023 1500 Gross per 24 hour  Intake 468.83 ml  Output 200 ml  Net 268.83 ml     Assessment/Plan:  62 y.o. male is s/p right popliteal and TP trunk thrombectomy and endarterectomy with bovine patch angioplasty as well as bypass from the profunda to the below the knee popliteal artery with PTFE  3 Days Post-Op   R foot warm and well perfused H&H stable after transfusion Continue IV heparin; can transition back to home dose of Xarelto on day of discharge Ok for d/c to SNF when bed available and approved   Emilie Rutter, PA-C [facility] [phone] 07/22/2023 8:19 AM  I have  independently interviewed and examined patient and agree with PA assessment and plan above.  Right foot is warm and well-perfused with palpable PT and faintly palpable dorsalis pedis.  Plan will be for SNF when bed available and he can transition to Xarelto at that time.  Emilene Roma C. Randie Heinz, MD [facility] [place] Office: [phone] Pager: [phone]

## 2023-07-22 NOTE — Progress Notes (Addendum)
PHARMACY - ANTICOAGULATION CONSULT NOTE  Pharmacy Consult for IV heparin>>Xarelto Indication: critical limb ischemia   Allergies  Allergen Reactions   Percocet [Oxycodone-Acetaminophen] Hives and Itching   Vicodin [Hydrocodone-Acetaminophen] Hives and Itching   Shellfish Allergy Hives   Chocolate Flavor Hives   Chocolate Hives   Tomato Hives    Patient Measurements: Height: 5\' 7"  (170.2 cm) Weight: 77.1 kg (170 lb) IBW/kg (Calculated) : 66.1 Heparin Dosing Weight: 77.2 kg  Vital Signs: Temp: 98.1 F (36.7 C) (10/21 1151) Temp Source: Oral (10/21 1151) BP: 151/88 (10/21 1151) Pulse Rate: 68 (10/21 1151)  Labs: Recent Labs    07/20/23 0412 07/21/23 0319 07/21/23 1348 07/21/23 1755 07/22/23 0345 07/22/23 1141  HGB 7.1*  --  7.3*  --  7.9*  --   HCT 21.7*  --  22.7*  --  24.5*  --   PLT 145*  --  173  --  215  --   APTT 41* 59*  --   --   --   --   HEPARINUNFRC <0.10* <0.10*  --  0.19* 0.18* 0.35  CREATININE 1.86* 1.63*  --   --  1.58*  --     Estimated Creatinine Clearance: 44.7 mL/min (A) (by C-G formula based on SCr of 1.58 mg/dL (H)).   Medical History: Past Medical History:  Diagnosis Date   Anxiety    Arthritis    "lower back" (11/28/2017)   CAD (coronary artery disease)    Chronic lower back pain    H/O immunosuppressive therapy    chronic/notes 11/28/2017   History of gout    "before kidney transport" (11/28/2017)   Hyperlipidemia    Hypertension    Kidney disease    s/p kidney transplant 2011; "not on dialysis now" (11/28/2017)   Tobacco use     Assessment: Theodore Arellano is a 62 y.o. year old male admitted on 07/15/2023 with concern for critical limb ischemia. Xarelto 2.5mg  prior to admission (LD 10/14 @ 0830). Pharmacy consulted to dose heparin.  The patient is s/p thrombectomy 10/15 and s/p right popliteal bypass graft on 10/18 (twice, rethrombosed after 1st attempt).  Per PA's note 10/20, the patient is stable to resume full dose heparin.  Will resume at the previous rate and monitor closely. No bolus at this time. CBC trending down but some blood loss expected given recent procedures.   Heparin level came back therapeutic this AM. We will continue current rate and check in AM.   Addendum  Ok to resume home Xarelto.   Goal of Therapy:  Monitor platelets by anticoagulation protocol: Yes   Plan:  Dc heparin Xarelto 2.5mg  PO BID Rx will monitor peripherally  Ulyses Southward, PharmD, BCIDP, AAHIVP, CPP Infectious Disease Pharmacist 07/22/2023 1:09 PM

## 2023-07-22 NOTE — Progress Notes (Signed)
PHARMACY - ANTICOAGULATION CONSULT NOTE  Pharmacy Consult for IV heparin Indication: critical limb ischemia   Allergies  Allergen Reactions   Percocet [Oxycodone-Acetaminophen] Hives and Itching   Vicodin [Hydrocodone-Acetaminophen] Hives and Itching   Shellfish Allergy Hives   Chocolate Flavor Hives   Chocolate Hives   Tomato Hives    Patient Measurements: Height: 5\' 7"  (170.2 cm) Weight: 77.1 kg (170 lb) IBW/kg (Calculated) : 66.1 Heparin Dosing Weight: 77.2 kg  Vital Signs: Temp: 98.7 F (37.1 C) (10/20 2000) BP: 147/86 (10/21 0000) Pulse Rate: 70 (10/20 2000)  Labs: Recent Labs    07/20/23 0412 07/21/23 0319 07/21/23 1348 07/21/23 1755 07/22/23 0345  HGB 7.1*  --  7.3*  --  7.9*  HCT 21.7*  --  22.7*  --  24.5*  PLT 145*  --  173  --  215  APTT 41* 59*  --   --   --   HEPARINUNFRC <0.10* <0.10*  --  0.19* 0.18*  CREATININE 1.86* 1.63*  --   --  1.58*    Estimated Creatinine Clearance: 44.7 mL/min (A) (by C-G formula based on SCr of 1.58 mg/dL (H)).   Medical History: Past Medical History:  Diagnosis Date   Anxiety    Arthritis    "lower back" (11/28/2017)   CAD (coronary artery disease)    Chronic lower back pain    H/O immunosuppressive therapy    chronic/notes 11/28/2017   History of gout    "before kidney transport" (11/28/2017)   Hyperlipidemia    Hypertension    Kidney disease    s/p kidney transplant 2011; "not on dialysis now" (11/28/2017)   Tobacco use     Assessment: LEMMY BAQUERA is a 62 y.o. year old male admitted on 07/15/2023 with concern for critical limb ischemia. Xarelto 2.5mg  prior to admission (LD 10/14 @ 0830). Pharmacy consulted to dose heparin.  The patient is s/p thrombectomy 10/15 and s/p right popliteal bypass graft on 10/18 (twice, rethrombosed after 1st attempt).  Per PA's note 10/20, the patient is stable to resume full dose heparin. Will resume at the previous rate and monitor closely. No bolus at this time. CBC trending  down but some blood loss expected given recent procedures.   Heparin level subtherapeutic at 0.18 despite rate adjustment earlier today. No infusion issues or bleeding concerns overnight.  Goal of Therapy:  Heparin level 0.3-0.7 units/ml Monitor platelets by anticoagulation protocol: Yes   Plan:  Increase heparin to 1500 units/h Recheck heparin level in 6h  Fredonia Highland, PharmD, [place], [facility] Clinical Pharmacist Please check AMION for all [facility] Pharmacy numbers 07/22/2023

## 2023-07-23 ENCOUNTER — Encounter (HOSPITAL_COMMUNITY): Payer: Self-pay | Admitting: Vascular Surgery

## 2023-07-23 DIAGNOSIS — I998 Other disorder of circulatory system: Secondary | ICD-10-CM | POA: Diagnosis not present

## 2023-07-23 LAB — GLUCOSE, CAPILLARY
Glucose-Capillary: 109 mg/dL — ABNORMAL HIGH (ref 70–99)
Glucose-Capillary: 115 mg/dL — ABNORMAL HIGH (ref 70–99)
Glucose-Capillary: 137 mg/dL — ABNORMAL HIGH (ref 70–99)
Glucose-Capillary: 93 mg/dL (ref 70–99)

## 2023-07-23 MED ORDER — LOSARTAN POTASSIUM 50 MG PO TABS: 100.0000 mg | ORAL_TABLET | Freq: Every evening | ORAL | 0 refills | Status: DC

## 2023-07-23 NOTE — TOC Transition Note (Signed)
Transition of Care [facility]) - CM/SW Discharge Note   Patient Details  Name: Theodore Arellano MRN: [id number] Date of Birth: 01-16-1961  Transition of [facility]) CM/SW Contact:  Eduard Roux, LCSW Phone Number: 07/23/2023, 12:24 PM   Clinical Narrative:     Patient will Discharge to: [facility] Discharge Date: 07/23/2023 Family Notified:called 2x-unable to contact family Transport By: Sharin Mons  Per MD patient is ready for discharge. RN, patient, and facility notified of discharge. Discharge Summary sent to facility. RN given number for [phone], Room 107. Ambulance transport requested for patient.   Clinical Social Worker signing off.  Antony Blackbird, MSW, LCSW Clinical Social Worker     Final next level of care: Skilled Nursing Facility Barriers to Discharge: Continued Medical Work up   Patient Goals and CMS Choice      Discharge Placement                         Discharge Plan and Services Additional resources added to the After Visit Summary for   In-house Referral: Clinical Social Work                                   Social Determinants of Health (SDOH) Interventions SDOH Screenings   Food Insecurity: No Food Insecurity (07/16/2023)  Housing: Low Risk  (07/16/2023)  Transportation Needs: Unmet Transportation Needs (07/16/2023)  Utilities: Not At Risk (07/16/2023)  Tobacco Use: High Risk (07/19/2023)     Readmission Risk Interventions     No data to display

## 2023-07-23 NOTE — Progress Notes (Addendum)
  Progress Note    07/23/2023 7:54 AM 4 Days Post-Op  Subjective:  no complaints   Vitals:   07/22/23 2327 07/23/23 0351  BP: 132/71 136/88  Pulse: 67 66  Resp: 16 18  Temp:  98.7 F (37.1 C)  SpO2: 100% 100%   Physical Exam: Lungs:  non labored Incisions:  R chest, groin, and lower leg incisions healing well Extremities:  palpable 1+ R DP Neurologic: A&O  CBC    Component Value Date/Time   WBC 8.2 07/22/2023 0345   RBC 2.77 (L) 07/22/2023 0345   HGB 7.9 (L) 07/22/2023 0345   HCT 24.5 (L) 07/22/2023 0345   PLT 215 07/22/2023 0345   MCV 88.4 07/22/2023 0345   MCH 28.5 07/22/2023 0345   MCHC 32.2 07/22/2023 0345   RDW 15.6 (H) 07/22/2023 0345   LYMPHSABS 1.5 07/16/2023 0423   MONOABS 0.5 07/16/2023 0423   EOSABS 0.2 07/16/2023 0423   BASOSABS 0.0 07/16/2023 0423    BMET    Component Value Date/Time   NA 139 07/22/2023 0345   K 4.0 07/22/2023 0345   CL 107 07/22/2023 0345   CO2 24 07/22/2023 0345   GLUCOSE 89 07/22/2023 0345   BUN 30 (H) 07/22/2023 0345   CREATININE 1.58 (H) 07/22/2023 0345   CALCIUM 9.0 07/22/2023 0345   GFRNONAA 49 (L) 07/22/2023 0345   GFRAA >60 01/13/2020 0248    INR    Component Value Date/Time   INR 1.6 (H) 01/12/2020 0656     Intake/Output Summary (Last 24 hours) at 07/23/2023 0754 Last data filed at 07/22/2023 2118 Gross per 24 hour  Intake 240 ml  Output 600 ml  Net -360 ml     Assessment/Plan:  62 y.o. male is s/p right popliteal and TP trunk thrombectomy and endarterectomy with bovine patch angioplasty as well as bypass from the profunda to the below the knee popliteal artery with PTFE   4 Days Post-Op   R foot well perfused with 1+ palpable R DP All incisions are well appearing Pharmacy transitioned patient from heparin to Xarelto SNF when insurance auth approved   Emilie Rutter, PA-C [facility] [phone] 07/23/2023 7:54 AM  I have independently interviewed and examined patient  and agree with PA assessment and plan above.  Palpable distal pulses with very strong signals.  All incisions are healing well.  Korianna Washer C. Randie Heinz, MD [facility] [place] Office: [phone] Pager: [phone]

## 2023-07-23 NOTE — Discharge Summary (Signed)
Physician Discharge Summary  Theodore Arellano NWG:[id number] DOB: December 03, 1960 DOA: 07/15/2023  PCP: [name], MD  Admit date: 07/15/2023 Discharge date: 07/23/2023  Admitted From: Home Disposition:  SNF  Recommendations for Outpatient Follow-up:  Follow up with PCP in 1-2 weeks Follow up with surgery as scheduled Repeat CBC/H&H in 3-4 days  Discharge Condition:Stable  CODE STATUS:Full Diet recommendation: Low salt low carb low fat diet    Brief/Interim Summary: Theodore Arellano is a 62 y.o. male with PMH significant for DM2, HTN, HLD, CAD/CABG, systolic CHF, CKD stage 3b h/o renal transplant,  PAD with h/o critical lower limb ischemia in 2022, legally blind in right eye,  h/o DVT on xarelto, h/o cocaine abuse, GERD, gout, glaucoma.  Patient presented with pain in the right lower extremity ongoing for about 3 weeks.  Concern was for vascular compromise.  Patient was hospitalized for further management.  Vascular surgery was consulted.    Patient admitted as above with worsening lower extremity pain noted to have acute on chronic ischemia secondary to profound thrombus -now status post intervention with vascular surgery as below tolerated procedure quite well transition back to Xarelto from heparin with ultimate plans for discharge to rehab given ongoing acute ambulatory dysfunction.   Discharge Diagnoses:  Principal Problem:   Vascular insufficiency of extremity Active Problems:   DM (diabetes mellitus) type II, controlled, with peripheral vascular disorder (HCC)   HTN (hypertension)   Chronic systolic CHF (congestive heart failure) (HCC)   Coronary artery disease   Renal transplant recipient   HLD (hyperlipidemia)   Acute kidney injury (HCC)   History of DVT (deep vein thrombosis)   CKD (chronic kidney disease)   Tobacco abuse   Critical lower limb ischemia (HCC)   Bradycardia  Acute on chronic ischemia right lower extremity S/p thrombectomy, angioplasty and bypass  -10/15 Dr. Randie Heinz S/p redo of right femoropopliteal bypass and angiogram - 10/17 Transition back to Xarelto  Vascular surgery outpatient follow-up as scheduled   Acute on chronic anemia questionably blood loss anemia on anemia of chronic disease  -Hemoglobin stabilizing - likely poorly responsive postoperatively given anemia of chronic disease CKD 3B and renal transplant. -Recommend repeat CBC/H&H in the next 3-4 days - 3 unit PRBC transfused since admission, now stable   CAD/CABG Stable, no anginal symptoms currently.  Continue core measures as above   AKI on CKD 3B Chronic metabolic acidosis H/o renal transplant -Mild, continue to advance diet, resume lasix -Baseline creatinine 1.5, peak 2.0 - back to baseline -Continue mycophenolate, prednisone, tacrolimus Home regimen -Continue sodium bicarb supplement   Chronic combined systolic and diastolic CHF Hypertension Echocardiogram from February showed EF of 30 to 35%. Lasix and Cozaar resumed IV fluids discontinued as above   Type 2 diabetes mellitus A1c 6.4 on 07/16/2023 Continue sliding scale/hypoglycemic protocol   H/o DVT Resume Xarelto as above   Discharge Instructions  Discharge Instructions     Discharge patient   Complete by: As directed    Discharge disposition: 03-Skilled Nursing Facility   Discharge patient date: 07/23/2023      Allergies as of 07/23/2023       Reactions   Percocet [oxycodone-acetaminophen] Hives, Itching   Vicodin [hydrocodone-acetaminophen] Hives, Itching   Shellfish Allergy Hives   Chocolate Flavor Hives   Chocolate Hives   Tomato Hives        Medication List     TAKE these medications    acetaminophen 500 MG tablet Commonly known as: TYLENOL Take 500 mg  Physician Discharge Summary  Theodore Arellano NWG:[id number] DOB: December 03, 1960 DOA: 07/15/2023  PCP: [name], MD  Admit date: 07/15/2023 Discharge date: 07/23/2023  Admitted From: Home Disposition:  SNF  Recommendations for Outpatient Follow-up:  Follow up with PCP in 1-2 weeks Follow up with surgery as scheduled Repeat CBC/H&H in 3-4 days  Discharge Condition:Stable  CODE STATUS:Full Diet recommendation: Low salt low carb low fat diet    Brief/Interim Summary: Theodore Arellano is a 62 y.o. male with PMH significant for DM2, HTN, HLD, CAD/CABG, systolic CHF, CKD stage 3b h/o renal transplant,  PAD with h/o critical lower limb ischemia in 2022, legally blind in right eye,  h/o DVT on xarelto, h/o cocaine abuse, GERD, gout, glaucoma.  Patient presented with pain in the right lower extremity ongoing for about 3 weeks.  Concern was for vascular compromise.  Patient was hospitalized for further management.  Vascular surgery was consulted.    Patient admitted as above with worsening lower extremity pain noted to have acute on chronic ischemia secondary to profound thrombus -now status post intervention with vascular surgery as below tolerated procedure quite well transition back to Xarelto from heparin with ultimate plans for discharge to rehab given ongoing acute ambulatory dysfunction.   Discharge Diagnoses:  Principal Problem:   Vascular insufficiency of extremity Active Problems:   DM (diabetes mellitus) type II, controlled, with peripheral vascular disorder (HCC)   HTN (hypertension)   Chronic systolic CHF (congestive heart failure) (HCC)   Coronary artery disease   Renal transplant recipient   HLD (hyperlipidemia)   Acute kidney injury (HCC)   History of DVT (deep vein thrombosis)   CKD (chronic kidney disease)   Tobacco abuse   Critical lower limb ischemia (HCC)   Bradycardia  Acute on chronic ischemia right lower extremity S/p thrombectomy, angioplasty and bypass  -10/15 Dr. Randie Heinz S/p redo of right femoropopliteal bypass and angiogram - 10/17 Transition back to Xarelto  Vascular surgery outpatient follow-up as scheduled   Acute on chronic anemia questionably blood loss anemia on anemia of chronic disease  -Hemoglobin stabilizing - likely poorly responsive postoperatively given anemia of chronic disease CKD 3B and renal transplant. -Recommend repeat CBC/H&H in the next 3-4 days - 3 unit PRBC transfused since admission, now stable   CAD/CABG Stable, no anginal symptoms currently.  Continue core measures as above   AKI on CKD 3B Chronic metabolic acidosis H/o renal transplant -Mild, continue to advance diet, resume lasix -Baseline creatinine 1.5, peak 2.0 - back to baseline -Continue mycophenolate, prednisone, tacrolimus Home regimen -Continue sodium bicarb supplement   Chronic combined systolic and diastolic CHF Hypertension Echocardiogram from February showed EF of 30 to 35%. Lasix and Cozaar resumed IV fluids discontinued as above   Type 2 diabetes mellitus A1c 6.4 on 07/16/2023 Continue sliding scale/hypoglycemic protocol   H/o DVT Resume Xarelto as above   Discharge Instructions  Discharge Instructions     Discharge patient   Complete by: As directed    Discharge disposition: 03-Skilled Nursing Facility   Discharge patient date: 07/23/2023      Allergies as of 07/23/2023       Reactions   Percocet [oxycodone-acetaminophen] Hives, Itching   Vicodin [hydrocodone-acetaminophen] Hives, Itching   Shellfish Allergy Hives   Chocolate Flavor Hives   Chocolate Hives   Tomato Hives        Medication List     TAKE these medications    acetaminophen 500 MG tablet Commonly known as: TYLENOL Take 500 mg  Referring Phys: Ivin Booty ROBINS --------------------------------------------------------------------------------  Indications: Ischemia right leg and pain in right hip. High Risk Factors: Hypertension, hyperlipidemia, current smoker. Other Factors: Right Axillary to Profunda femoral artery and right femoral to                below the knee popliteal artery bypass graft 01/03/21.  Current ABI: NA Performing Technologist: Marilynne Halsted RDMS, RVT  Examination Guidelines: A complete evaluation includes B-mode imaging, spectral Doppler, color Doppler, and power Doppler as needed of all accessible portions of each vessel. Bilateral testing is considered an integral part of a complete examination. Limited examinations for reoccurring indications may be performed as noted.   Right Graft #1: +------------------+--------+--------+--------+--------+                   PSV cm/sStenosisWaveformComments +------------------+--------+--------+--------+--------+ Inflow                                             +------------------+--------+--------+--------+--------+ Prox Anastomosis          occluded                 +------------------+--------+--------+--------+--------+ Proximal Graft            occluded                 +------------------+--------+--------+--------+--------+ Mid Graft                 occluded                 +------------------+--------+--------+--------+--------+ Distal Graft              occluded                 +------------------+--------+--------+--------+--------+ Distal Anastomosis        occluded                 +------------------+--------+--------+--------+--------+ Outflow                                            +------------------+--------+--------+--------+--------+   Summary: Right: Occluded right femoral to popliteal bypass graft.  See table(s) above for  measurements and observations. Electronically signed by Lemar Livings MD on 07/16/2023 at 4:40:58 PM.    Final    VAS [place] AXILLARY-FEMORAL GRAFT  Result Date: 07/16/2023 LOWER EXTREMITY ARTERIAL DUPLEX STUDY Patient Name:  Theodore Arellano  Date of Exam:   07/16/2023 Medical Rec #: [id number]      Accession #:    [id number] Date of Birth: 10-31-1960      Patient Gender: M Patient Age:   87 years Exam Location:  [facility] Procedure:      VAS [place] AXILLARY-FEMORAL GRAFT Referring Phys: Ivin Booty ROBINS --------------------------------------------------------------------------------  Indications: Peripheral artery disease, and Ischemic right leg. High Risk Factors: Hypertension, hyperlipidemia, current smoker.  Vascular Interventions: Right Axillary to profunda - femoral bypass and right                         femoral to above knee popliteal bypass graft 01/03/21. Current ABI:            NA Comparison Study: 02/12/22 Patent Ax-Prof Performing Technologist: Marilynne Halsted RDMS, RVT  Examination Guidelines: A complete evaluation includes B-mode imaging, spectral  Referring Phys: Ivin Booty ROBINS --------------------------------------------------------------------------------  Indications: Ischemia right leg and pain in right hip. High Risk Factors: Hypertension, hyperlipidemia, current smoker. Other Factors: Right Axillary to Profunda femoral artery and right femoral to                below the knee popliteal artery bypass graft 01/03/21.  Current ABI: NA Performing Technologist: Marilynne Halsted RDMS, RVT  Examination Guidelines: A complete evaluation includes B-mode imaging, spectral Doppler, color Doppler, and power Doppler as needed of all accessible portions of each vessel. Bilateral testing is considered an integral part of a complete examination. Limited examinations for reoccurring indications may be performed as noted.   Right Graft #1: +------------------+--------+--------+--------+--------+                   PSV cm/sStenosisWaveformComments +------------------+--------+--------+--------+--------+ Inflow                                             +------------------+--------+--------+--------+--------+ Prox Anastomosis          occluded                 +------------------+--------+--------+--------+--------+ Proximal Graft            occluded                 +------------------+--------+--------+--------+--------+ Mid Graft                 occluded                 +------------------+--------+--------+--------+--------+ Distal Graft              occluded                 +------------------+--------+--------+--------+--------+ Distal Anastomosis        occluded                 +------------------+--------+--------+--------+--------+ Outflow                                            +------------------+--------+--------+--------+--------+   Summary: Right: Occluded right femoral to popliteal bypass graft.  See table(s) above for  measurements and observations. Electronically signed by Lemar Livings MD on 07/16/2023 at 4:40:58 PM.    Final    VAS [place] AXILLARY-FEMORAL GRAFT  Result Date: 07/16/2023 LOWER EXTREMITY ARTERIAL DUPLEX STUDY Patient Name:  Theodore Arellano  Date of Exam:   07/16/2023 Medical Rec #: [id number]      Accession #:    [id number] Date of Birth: 10-31-1960      Patient Gender: M Patient Age:   87 years Exam Location:  [facility] Procedure:      VAS [place] AXILLARY-FEMORAL GRAFT Referring Phys: Ivin Booty ROBINS --------------------------------------------------------------------------------  Indications: Peripheral artery disease, and Ischemic right leg. High Risk Factors: Hypertension, hyperlipidemia, current smoker.  Vascular Interventions: Right Axillary to profunda - femoral bypass and right                         femoral to above knee popliteal bypass graft 01/03/21. Current ABI:            NA Comparison Study: 02/12/22 Patent Ax-Prof Performing Technologist: Marilynne Halsted RDMS, RVT  Examination Guidelines: A complete evaluation includes B-mode imaging, spectral  Physician Discharge Summary  Theodore Arellano NWG:[id number] DOB: December 03, 1960 DOA: 07/15/2023  PCP: [name], MD  Admit date: 07/15/2023 Discharge date: 07/23/2023  Admitted From: Home Disposition:  SNF  Recommendations for Outpatient Follow-up:  Follow up with PCP in 1-2 weeks Follow up with surgery as scheduled Repeat CBC/H&H in 3-4 days  Discharge Condition:Stable  CODE STATUS:Full Diet recommendation: Low salt low carb low fat diet    Brief/Interim Summary: Theodore Arellano is a 62 y.o. male with PMH significant for DM2, HTN, HLD, CAD/CABG, systolic CHF, CKD stage 3b h/o renal transplant,  PAD with h/o critical lower limb ischemia in 2022, legally blind in right eye,  h/o DVT on xarelto, h/o cocaine abuse, GERD, gout, glaucoma.  Patient presented with pain in the right lower extremity ongoing for about 3 weeks.  Concern was for vascular compromise.  Patient was hospitalized for further management.  Vascular surgery was consulted.    Patient admitted as above with worsening lower extremity pain noted to have acute on chronic ischemia secondary to profound thrombus -now status post intervention with vascular surgery as below tolerated procedure quite well transition back to Xarelto from heparin with ultimate plans for discharge to rehab given ongoing acute ambulatory dysfunction.   Discharge Diagnoses:  Principal Problem:   Vascular insufficiency of extremity Active Problems:   DM (diabetes mellitus) type II, controlled, with peripheral vascular disorder (HCC)   HTN (hypertension)   Chronic systolic CHF (congestive heart failure) (HCC)   Coronary artery disease   Renal transplant recipient   HLD (hyperlipidemia)   Acute kidney injury (HCC)   History of DVT (deep vein thrombosis)   CKD (chronic kidney disease)   Tobacco abuse   Critical lower limb ischemia (HCC)   Bradycardia  Acute on chronic ischemia right lower extremity S/p thrombectomy, angioplasty and bypass  -10/15 Dr. Randie Heinz S/p redo of right femoropopliteal bypass and angiogram - 10/17 Transition back to Xarelto  Vascular surgery outpatient follow-up as scheduled   Acute on chronic anemia questionably blood loss anemia on anemia of chronic disease  -Hemoglobin stabilizing - likely poorly responsive postoperatively given anemia of chronic disease CKD 3B and renal transplant. -Recommend repeat CBC/H&H in the next 3-4 days - 3 unit PRBC transfused since admission, now stable   CAD/CABG Stable, no anginal symptoms currently.  Continue core measures as above   AKI on CKD 3B Chronic metabolic acidosis H/o renal transplant -Mild, continue to advance diet, resume lasix -Baseline creatinine 1.5, peak 2.0 - back to baseline -Continue mycophenolate, prednisone, tacrolimus Home regimen -Continue sodium bicarb supplement   Chronic combined systolic and diastolic CHF Hypertension Echocardiogram from February showed EF of 30 to 35%. Lasix and Cozaar resumed IV fluids discontinued as above   Type 2 diabetes mellitus A1c 6.4 on 07/16/2023 Continue sliding scale/hypoglycemic protocol   H/o DVT Resume Xarelto as above   Discharge Instructions  Discharge Instructions     Discharge patient   Complete by: As directed    Discharge disposition: 03-Skilled Nursing Facility   Discharge patient date: 07/23/2023      Allergies as of 07/23/2023       Reactions   Percocet [oxycodone-acetaminophen] Hives, Itching   Vicodin [hydrocodone-acetaminophen] Hives, Itching   Shellfish Allergy Hives   Chocolate Flavor Hives   Chocolate Hives   Tomato Hives        Medication List     TAKE these medications    acetaminophen 500 MG tablet Commonly known as: TYLENOL Take 500 mg  graft and tibial arteries. Please refer to the dictated operative report for full details of intraoperative findings and procedure. Electronically Signed   By: Gilmer Mor D.O.   On: 07/19/2023 13:24   VAS [place] LOWER EXTREMITY BYPASS GRAFT DUPL  Result Date: 07/17/2023 LOWER EXTREMITY ARTERIAL DUPLEX STUDY Patient Name:  Theodore Arellano  Date of Exam:   07/17/2023 Medical Rec #: [id number]      Accession #:    [id number] Date of Birth: 07-27-1961      Patient Gender: M Patient Age:   56 years Exam Location:  [facility] Procedure:      VAS [place] LOWER EXTREMITY BYPASS GRAFT DUPLEX Referring Phys: Graceann Congress --------------------------------------------------------------------------------  Indications: REDO RIGHT AXILLARY-FEMORAL - FEMORAL - POPLITEAL ARTERY  BYPASS              EMBOLECTOMY - 07/16/23.  Current ABI: NA Performing Technologist: Marilynne Halsted RDMS, RVT  Examination Guidelines: A complete evaluation includes B-mode imaging, spectral Doppler, color Doppler, and power Doppler as needed of all accessible portions of each vessel. Bilateral testing is considered an integral part of a complete examination. Limited examinations for reoccurring indications may be performed as noted.   Right Graft #1: Femoral - popliteal +------------------+--------+--------+--------+--------+                   PSV cm/sStenosisWaveformComments +------------------+--------+--------+--------+--------+ Inflow                                             +------------------+--------+--------+--------+--------+ Prox Anastomosis          occluded                 +------------------+--------+--------+--------+--------+ Proximal Graft            occluded                 +------------------+--------+--------+--------+--------+ Mid Graft                 occluded                 +------------------+--------+--------+--------+--------+ Distal Graft              occluded                 +------------------+--------+--------+--------+--------+ Distal Anastomosis        occluded                 +------------------+--------+--------+--------+--------+ Outflow                                            +------------------+--------+--------+--------+--------+   Summary: Right: Occluded right femoral- popliteal bypass graft.  See table(s) above for measurements and observations. Electronically signed by Coral Else MD on 07/17/2023 at 7:19:13 PM.    Final    VAS [place] LOWER EXTREMITY BYPASS GRAFT DUPL  Result Date: 07/16/2023 LOWER EXTREMITY ARTERIAL DUPLEX STUDY Patient Name:  Theodore Arellano  Date of Exam:   07/16/2023 Medical Rec #: [id number]      Accession #:    [id number] Date of Birth: [birth year]      Patient Gender: M Patient Age:   18 years  Exam Location:  [facility] Procedure:      VAS [place] LOWER EXTREMITY BYPASS GRAFT DUPLEX  graft and tibial arteries. Please refer to the dictated operative report for full details of intraoperative findings and procedure. Electronically Signed   By: Gilmer Mor D.O.   On: 07/19/2023 13:24   VAS [place] LOWER EXTREMITY BYPASS GRAFT DUPL  Result Date: 07/17/2023 LOWER EXTREMITY ARTERIAL DUPLEX STUDY Patient Name:  Theodore Arellano  Date of Exam:   07/17/2023 Medical Rec #: [id number]      Accession #:    [id number] Date of Birth: 07-27-1961      Patient Gender: M Patient Age:   56 years Exam Location:  [facility] Procedure:      VAS [place] LOWER EXTREMITY BYPASS GRAFT DUPLEX Referring Phys: Graceann Congress --------------------------------------------------------------------------------  Indications: REDO RIGHT AXILLARY-FEMORAL - FEMORAL - POPLITEAL ARTERY  BYPASS              EMBOLECTOMY - 07/16/23.  Current ABI: NA Performing Technologist: Marilynne Halsted RDMS, RVT  Examination Guidelines: A complete evaluation includes B-mode imaging, spectral Doppler, color Doppler, and power Doppler as needed of all accessible portions of each vessel. Bilateral testing is considered an integral part of a complete examination. Limited examinations for reoccurring indications may be performed as noted.   Right Graft #1: Femoral - popliteal +------------------+--------+--------+--------+--------+                   PSV cm/sStenosisWaveformComments +------------------+--------+--------+--------+--------+ Inflow                                             +------------------+--------+--------+--------+--------+ Prox Anastomosis          occluded                 +------------------+--------+--------+--------+--------+ Proximal Graft            occluded                 +------------------+--------+--------+--------+--------+ Mid Graft                 occluded                 +------------------+--------+--------+--------+--------+ Distal Graft              occluded                 +------------------+--------+--------+--------+--------+ Distal Anastomosis        occluded                 +------------------+--------+--------+--------+--------+ Outflow                                            +------------------+--------+--------+--------+--------+   Summary: Right: Occluded right femoral- popliteal bypass graft.  See table(s) above for measurements and observations. Electronically signed by Coral Else MD on 07/17/2023 at 7:19:13 PM.    Final    VAS [place] LOWER EXTREMITY BYPASS GRAFT DUPL  Result Date: 07/16/2023 LOWER EXTREMITY ARTERIAL DUPLEX STUDY Patient Name:  Theodore Arellano  Date of Exam:   07/16/2023 Medical Rec #: [id number]      Accession #:    [id number] Date of Birth: [birth year]      Patient Gender: M Patient Age:   18 years  Exam Location:  [facility] Procedure:      VAS [place] LOWER EXTREMITY BYPASS GRAFT DUPLEX

## 2023-07-23 NOTE — Progress Notes (Signed)
Occupational Therapy Treatment Patient Details Name: Theodore Arellano MRN: [id number] DOB: 02-May-1961 Today's Date: 07/23/2023   History of present illness Pt is a 62 y/o M admitted on 07/15/23 after presenting with c/o RLE pain. Pt is s/p R axillary-femoral, femoral popliteal embolectomy. PMH: HLD, HTN, CKD 3B, renal transplant, PAD, systolic CHF, legally blind in R eye, CAD s/p CABG, gout, cocaine abuse, glaucoma   OT comments  Pt making excellent progress towards OT goals. Pt able to complete bed mobility, transfers and hallway mobility using RW with Supervision and no LOB. Pt does continue to endorse R groin incision site pain requiring Min A for some aspects of LB dressing d/t decreased flexibility. Significant time spent discussing compensatory strategies for ADLs, use of RW with IADLs and where assist may be needed w/ community IADLs. Pt feels like he can manage ADLs/mobility in apartment but does have decreased support for IADLs at DC. Pt would benefit from RW, BSC (over toilet) and shower chair to maximize safety if he opts to DC home rather than postacute rehab.      If plan is discharge home, recommend the following:  A little help with bathing/dressing/bathroom;Assistance with cooking/housework;Assist for transportation   Equipment Recommendations  BSC/3in1;Other (comment);Tub/shower seat (RW)    Recommendations for Other Services      Precautions / Restrictions Precautions Precautions: Fall Restrictions Weight Bearing Restrictions: No       Mobility Bed Mobility Overal bed mobility: Needs Assistance Bed Mobility: Supine to Sit, Sit to Supine     Supine to sit: Supervision, HOB elevated, Used rails Sit to supine: Used rails, Supervision   General bed mobility comments: increased time/effort    Transfers Overall transfer level: Needs assistance Equipment used: Rolling walker (2 wheels) Transfers: Sit to/from Stand Sit to Stand: Supervision                  Balance Overall balance assessment: Needs assistance Sitting-balance support: No upper extremity supported, Feet supported Sitting balance-Leahy Scale: Good     Standing balance support: Bilateral upper extremity supported, During functional activity, Reliant on assistive device for balance Standing balance-Leahy Scale: Fair Standing balance comment: able to stand statically w/o support and raise arms overhead without LOB. BUE needed for mobility to offload RLE                           ADL either performed or assessed with clinical judgement   ADL Overall ADL's : Needs assistance/impaired                     Lower Body Dressing: Minimal assistance;Sitting/lateral leans;Sit to/from stand Lower Body Dressing Details (indicate cue type and reason): difficulty reaching R foot to don sock, unable to fully cross LE due to R groin sensitivity. discussed easier clothing to manage/don at home   Toilet Transfer Details (indicate cue type and reason): discussed placement of BSC over toilet at home to maximize ease of transfers         Functional mobility during ADLs: Rolling walker (2 wheels);Supervision/safety General ADL Comments: extended time spent discussing ADL modifications, IADL assist and adaptive strategies (ordering groceries online, using electric cart in store, transporation assist due to R foot numbness and laundry strategies). Discussed DME uses at home, managing IADLs while using RW w/ balance considerations    Extremity/Trunk Assessment Upper Extremity Assessment Upper Extremity Assessment: Overall WFL for tasks assessed;Right hand dominant   Lower Extremity Assessment Lower  Extremity Assessment: Defer to PT evaluation        Vision   Vision Assessment?: Vision impaired- to be further tested in functional context Additional Comments: R eye legally blind   Perception     Praxis      Cognition Arousal: Alert Behavior During Therapy: WFL for  tasks assessed/performed Overall Cognitive Status: No family/caregiver present to determine baseline cognitive functioning Area of Impairment: Awareness, Problem solving                           Awareness: Emergent Problem Solving: Requires verbal cues General Comments: AAOx4 and pleasant throughout session. Able to follow 1 step commands consistently, showing insight into deficits but does need cues on how to problem solve tasks if limited support at DC.        Exercises      Shoulder Instructions       General Comments noted tele monitor not reading, checked all leads which were intact, changed out a few stickers brought in by nursing student, unplugged/replugged in tele leads though did not fix issue - RN aware    Pertinent Vitals/ Pain       Pain Assessment Pain Assessment: Faces Faces Pain Scale: Hurts a little bit Pain Location: R groin site Pain Descriptors / Indicators: Sore, Grimacing Pain Intervention(s): Monitored during session, Limited activity within patient's tolerance  Home Living                                          Prior Functioning/Environment              Frequency  Min 1X/week        Progress Toward Goals  OT Goals(current goals can now be found in the care plan section)  Progress towards OT goals: Progressing toward goals  Acute Rehab OT Goals Patient Stated Goal: go home OT Goal Formulation: With patient Time For Goal Achievement: 07/31/23 Potential to Achieve Goals: Good ADL Goals Pt Will Perform Grooming: with modified independence;standing Pt Will Perform Lower Body Bathing: sit to/from stand;sitting/lateral leans;with supervision Pt Will Perform Lower Body Dressing: sit to/from stand;sitting/lateral leans;with modified independence Pt Will Transfer to Toilet: with modified independence;regular height toilet;ambulating Pt Will Perform Toileting - Clothing Manipulation and hygiene: with modified  independence;sitting/lateral leans;sit to/from stand Pt/caregiver will Perform Home Exercise Program: Increased strength;Both right and left upper extremity;With theraband;With theraputty;With Supervision;With written HEP provided  Plan      Co-evaluation                 AM-PAC OT "6 Clicks" Daily Activity     Outcome Measure   Help from another person eating meals?: None Help from another person taking care of personal grooming?: None Help from another person toileting, which includes using toliet, bedpan, or urinal?: A Little Help from another person bathing (including washing, rinsing, drying)?: A Little Help from another person to put on and taking off regular upper body clothing?: None Help from another person to put on and taking off regular lower body clothing?: A Little 6 Click Score: 21    End of Session Equipment Utilized During Treatment: Rolling walker (2 wheels);Gait belt  OT Visit Diagnosis: Unsteadiness on feet (R26.81);Other abnormalities of gait and mobility (R26.89);Muscle weakness (generalized) (M62.81);Other (comment)   Activity Tolerance Patient tolerated treatment well   Patient Left in bed;with call  bell/phone within reach;with bed alarm set   Nurse Communication Mobility status        Time: 1031-1100 OT Time Calculation (min): 29 min  Charges: OT General Charges $OT Visit: 1 Visit OT Treatments $Self Care/Home Management : 23-37 mins  Bradd Canary, OTR/L Acute Rehab Services Office: [phone]   Lorre Munroe 07/23/2023, 11:45 AM

## 2023-07-24 NOTE — Progress Notes (Signed)
Discharge:   Report called to [facility] at [facility].  BP elevated r/t 9/10 pain at RU chest incision and reportedly no pain medicine was requested of given since previous evening.  Nurse and DON at facility ok with transfer BP trending down.   Pt requested 2 oxycodone tabs and given prior to departure with PTAR.

## 2023-08-21 ENCOUNTER — Ambulatory Visit (INDEPENDENT_AMBULATORY_CARE_PROVIDER_SITE_OTHER): Payer: 59 | Admitting: Physician Assistant

## 2023-08-21 VITALS — BP 121/82 | HR 79 | Temp 98.6°F | Ht 67.0 in | Wt 176.8 lb

## 2023-08-21 DIAGNOSIS — F17200 Nicotine dependence, unspecified, uncomplicated: Secondary | ICD-10-CM

## 2023-08-21 DIAGNOSIS — I739 Peripheral vascular disease, unspecified: Secondary | ICD-10-CM

## 2023-08-22 ENCOUNTER — Encounter: Payer: Self-pay | Admitting: Physician Assistant

## 2023-08-22 NOTE — Progress Notes (Signed)
POST OPERATIVE OFFICE NOTE    CC:  F/u for surgery  HPI:  This is a 62 y.o. male who is s/p redo axillary to right profunda bypass as well as jump graft to the below the knee popliteal artery with vein patch angioplasty at the TP trunk.  This was performed by Dr. Randie Heinz on 07/16/2023.  Unfortunately he thrombosed his leg bypass and underwent thrombectomy of the right peroneal and PT arteries with TP trunk endarterectomy and bovine patch angioplasty and redo bypass from axillary to profunda with PTFE with jump graft to the below the knee popliteal artery with PTFE on 07/19/2023.  The rest pain in his right foot is completely resolved.  He is ambulating well since discharge from the hospital.  He believes his incisions are all healed.  He developed a fluid collection at his below the knee incision however denies any drainage.  He continues to smoke on a daily basis.  He is on aspirin and Xarelto.  Allergies  Allergen Reactions   Percocet [Oxycodone-Acetaminophen] Hives and Itching   Vicodin [Hydrocodone-Acetaminophen] Hives and Itching   Shellfish Allergy Hives   Chocolate Flavoring Agent (Non-Screening) Hives   Chocolate Hives   Tomato Hives    Current Outpatient Medications  Medication Sig Dispense Refill   acetaminophen (TYLENOL) 500 MG tablet Take 500 mg by mouth every 6 (six) hours as needed for moderate pain or headache.     aspirin EC 81 MG tablet Take 81 mg by mouth daily.     blood glucose meter kit and supplies KIT Dispense based on patient and insurance preference. Use up to four times daily as directed. (FOR ICD-9 250.00, 250.01). 1 each 0   brimonidine (ALPHAGAN) 0.2 % ophthalmic solution Place 1 drop into the right eye 3 (three) times daily. (Patient not taking: Reported on 07/15/2023) 10 mL 12   colchicine 0.6 MG tablet Take 1 tablet (0.6 mg total) by mouth daily as needed. (Patient taking differently: Take 0.6 mg by mouth daily as needed (for gout flares).)     furosemide  (LASIX) 40 MG tablet Take 40 mg by mouth daily.     hydrOXYzine (ATARAX) 25 MG tablet Take 25-50 mg by mouth every 6 (six) hours as needed for itching (or sleep).     losartan (COZAAR) 50 MG tablet Take 2 tablets (100 mg total) by mouth every evening. 30 tablet 0   mycophenolate (MYFORTIC) 360 MG TBEC EC tablet Take 720 mg by mouth 2 (two) times daily.     NARCAN 4 MG/0.1ML LIQD nasal spray kit Place 1 spray into the nose as needed (overdose).     pantoprazole (PROTONIX) 40 MG tablet Take 40 mg by mouth in the morning.     predniSONE (DELTASONE) 5 MG tablet Take 5 mg by mouth daily.     pregabalin (LYRICA) 75 MG capsule Take 75 mg by mouth 2 (two) times daily.     rivaroxaban (XARELTO) 2.5 MG TABS tablet Take 1 tablet (2.5 mg total) by mouth 2 (two) times daily. (Patient taking differently: Take 5 mg by mouth every morning.) 60 tablet    rosuvastatin (CRESTOR) 5 MG tablet Take 5 mg by mouth daily.     sodium bicarbonate 650 MG tablet Take 1,300 mg by mouth 2 (two) times daily.     tacrolimus (PROGRAF) 1 MG capsule Take 2-3 mg by mouth See admin instructions. Take 3 mg by mouth in the morning and 2 mg at bedtime     tamsulosin (FLOMAX)  0.4 MG CAPS capsule Take 0.4 mg by mouth at bedtime.     No current facility-administered medications for this visit.     ROS:  See HPI  Physical Exam:  Vitals:   08/21/23 1427  BP: 121/82  Pulse: 79  Temp: 98.6 F (37 C)  TempSrc: Temporal  SpO2: 97%  Weight: 176 lb 12.8 oz (80.2 kg)  Height: 5\' 7"  (1.702 m)    Incision: Right chest, right groin, and below the knee incision healed; large focal fluid collection at the below the knee incision with no skin compromise or incision dehiscence.  No erythema Extremities: Brisk DP and PT signals by Doppler Neuro: A&O  Assessment/Plan:  This is a 62 y.o. male who is s/p: Redo right axillary to profunda bypass with jump bypass to the below the knee popliteal and TP trunk  Subjectively rest pain has  completely resolved in the right foot Patient has a brisk DP and PT signal by Doppler All incisions have completely healed He does have a fluid collection at the below the knee popliteal incision.  He would have a high risk for infection if this were to be surgically evacuated.  Hopefully this will resolve with time.  We will check an axillofemoral and right leg bypass duplex with ABIs in 4 to 6 weeks.  He will continue his aspirin and Xarelto daily.  I encouraged smoking cessation.   Emilie Rutter, PA-C [facility] [phone]  Clinic MD:  Randie Heinz

## 2023-08-26 ENCOUNTER — Other Ambulatory Visit: Payer: Self-pay

## 2023-08-26 DIAGNOSIS — I739 Peripheral vascular disease, unspecified: Secondary | ICD-10-CM

## 2023-10-14 ENCOUNTER — Ambulatory Visit (HOSPITAL_COMMUNITY): Payer: 59

## 2023-10-16 ENCOUNTER — Ambulatory Visit: Payer: 59

## 2023-10-24 ENCOUNTER — Ambulatory Visit (HOSPITAL_COMMUNITY): Payer: 59

## 2023-11-06 ENCOUNTER — Ambulatory Visit: Payer: 59

## 2023-11-21 ENCOUNTER — Emergency Department (HOSPITAL_COMMUNITY): Payer: 59

## 2023-11-21 ENCOUNTER — Emergency Department (HOSPITAL_COMMUNITY)
Admission: EM | Admit: 2023-11-21 | Discharge: 2023-11-21 | Disposition: A | Discharge status: Home / Self Care | Payer: 59 | Attending: Emergency Medicine | Admitting: Emergency Medicine

## 2023-11-21 ENCOUNTER — Encounter (HOSPITAL_COMMUNITY): Payer: Self-pay | Admitting: Emergency Medicine

## 2023-11-21 DIAGNOSIS — J189 Pneumonia, unspecified organism: Secondary | ICD-10-CM

## 2023-11-21 DIAGNOSIS — I10 Essential (primary) hypertension: Secondary | ICD-10-CM | POA: Diagnosis not present

## 2023-11-21 DIAGNOSIS — M109 Gout, unspecified: Secondary | ICD-10-CM | POA: Diagnosis not present

## 2023-11-21 DIAGNOSIS — R059 Cough, unspecified: Secondary | ICD-10-CM | POA: Diagnosis not present

## 2023-11-21 DIAGNOSIS — Z79899 Other long term (current) drug therapy: Secondary | ICD-10-CM | POA: Diagnosis not present

## 2023-11-21 DIAGNOSIS — E119 Type 2 diabetes mellitus without complications: Secondary | ICD-10-CM | POA: Diagnosis not present

## 2023-11-21 DIAGNOSIS — Z940 Kidney transplant status: Secondary | ICD-10-CM | POA: Insufficient documentation

## 2023-11-21 DIAGNOSIS — J168 Pneumonia due to other specified infectious organisms: Secondary | ICD-10-CM | POA: Diagnosis not present

## 2023-11-21 DIAGNOSIS — Z7982 Long term (current) use of aspirin: Secondary | ICD-10-CM | POA: Insufficient documentation

## 2023-11-21 DIAGNOSIS — Z7901 Long term (current) use of anticoagulants: Secondary | ICD-10-CM | POA: Insufficient documentation

## 2023-11-21 DIAGNOSIS — M79674 Pain in right toe(s): Secondary | ICD-10-CM | POA: Diagnosis present

## 2023-11-21 LAB — CBC WITH DIFFERENTIAL/PLATELET
Abs Immature Granulocytes: 0.04 10*3/uL (ref 0.00–0.07)
Basophils Absolute: 0 10*3/uL (ref 0.0–0.1)
Basophils Relative: 0 %
Eosinophils Absolute: 0.1 10*3/uL (ref 0.0–0.5)
Eosinophils Relative: 1 %
HCT: 42.1 % (ref 39.0–52.0)
Hemoglobin: 13 g/dL (ref 13.0–17.0)
Immature Granulocytes: 1 %
Lymphocytes Relative: 6 %
Lymphs Abs: 0.5 10*3/uL — ABNORMAL LOW (ref 0.7–4.0)
MCH: 26.6 pg (ref 26.0–34.0)
MCHC: 30.9 g/dL (ref 30.0–36.0)
MCV: 86.1 fL (ref 80.0–100.0)
Monocytes Absolute: 0.4 10*3/uL (ref 0.1–1.0)
Monocytes Relative: 4 %
Neutro Abs: 7.3 10*3/uL (ref 1.7–7.7)
Neutrophils Relative %: 88 %
Platelets: 337 10*3/uL (ref 150–400)
RBC: 4.89 MIL/uL (ref 4.22–5.81)
RDW: 14.2 % (ref 11.5–15.5)
WBC: 8.3 10*3/uL (ref 4.0–10.5)
nRBC: 0 % (ref 0.0–0.2)

## 2023-11-21 LAB — BASIC METABOLIC PANEL
Anion gap: 13 (ref 5–15)
BUN: 29 mg/dL — ABNORMAL HIGH (ref 8–23)
CO2: 22 mmol/L (ref 22–32)
Calcium: 9.6 mg/dL (ref 8.9–10.3)
Chloride: 105 mmol/L (ref 98–111)
Creatinine, Ser: 1.94 mg/dL — ABNORMAL HIGH (ref 0.61–1.24)
GFR, Estimated: 38 mL/min — ABNORMAL LOW (ref >60)
Glucose, Bld: 160 mg/dL — ABNORMAL HIGH (ref 70–99)
Potassium: 4.3 mmol/L (ref 3.5–5.1)
Sodium: 140 mmol/L (ref 135–145)

## 2023-11-21 LAB — RESP PANEL BY RT-PCR (RSV, FLU A&B, COVID)  RVPGX2
Influenza A by PCR: NEGATIVE
Influenza B by PCR: NEGATIVE
Resp Syncytial Virus by PCR: NEGATIVE
SARS Coronavirus 2 by RT PCR: NEGATIVE

## 2023-11-21 LAB — URIC ACID: Uric Acid, Serum: 9.4 mg/dL — ABNORMAL HIGH (ref 3.7–8.6)

## 2023-11-21 MED ORDER — OXYCODONE-ACETAMINOPHEN 5-325 MG PO TABS
1.0000 | ORAL_TABLET | Freq: Once | ORAL | Status: AC
  Administered 2023-11-21: 1 via ORAL
  Filled 2023-11-21: qty 1

## 2023-11-21 MED ORDER — HYDROCODONE-ACETAMINOPHEN 5-325 MG PO TABS: 1.0000 | ORAL_TABLET | Freq: Four times a day (QID) | ORAL | 0 refills | Status: DC | PRN

## 2023-11-21 MED ORDER — LEVOFLOXACIN 500 MG PO TABS: 750.0000 mg | ORAL_TABLET | Freq: Every day | ORAL | 0 refills | Status: AC

## 2023-11-21 MED ORDER — ACETAMINOPHEN 500 MG PO TABS
1000.0000 mg | ORAL_TABLET | Freq: Once | ORAL | Status: DC
  Filled 2023-11-21: qty 2

## 2023-11-21 MED ORDER — PREDNISONE 20 MG PO TABS: 40.0000 mg | ORAL_TABLET | Freq: Every day | ORAL | 0 refills | Status: DC

## 2023-11-21 NOTE — ED Provider Notes (Addendum)
 [place] EMERGENCY DEPARTMENT AT [facility] Provider Note   CSN: [id number] Arrival date & time: 11/21/23  1434     History  Chief Complaint  Patient presents with   Foot Pain    Theodore Arellano is a 63 y.o. male with past medical history of renal transplant, hypertension, diabetes, DVT on Eliquis, peripheral artery disease presenting to emergency room with bilateral great toe pain.  Patient reports he thinks his symptoms have been ongoing for 1 week and gradually worsening.  Reports pain is worse when ambulating or when he is trying to put his shoes on and off.  Patient reports history of gout, and he thinks this feels like his gout. He also reports approximately 1 week of decreased taste, cough, congestion.  Reports he was started on Augmentin and doxycycline for pneumonia.  Reports he is on day 4 of his antibiotics.  Reports he thinks this is getting better. Has not missed any doses. Denies any associated chest pain, shortness of breath, fevers or chills.    Foot Pain       Home Medications Prior to Admission medications   Medication Sig Start Date End Date Taking? Authorizing Provider  acetaminophen (TYLENOL) 500 MG tablet Take 500 mg by mouth every 6 (six) hours as needed for moderate pain or headache.    [provider]  aspirin EC 81 MG tablet Take 81 mg by mouth daily.    [provider]  blood glucose meter kit and supplies KIT Dispense based on patient and insurance preference. Use up to four times daily as directed. (FOR ICD-9 250.00, 250.01). 11/11/19   Rhetta Mura, MD  brimonidine (ALPHAGAN) 0.2 % ophthalmic solution Place 1 drop into the right eye 3 (three) times daily. Patient not taking: Reported on 07/15/2023 11/09/22   Levin Erp, MD  colchicine 0.6 MG tablet Take 1 tablet (0.6 mg total) by mouth daily as needed. Patient taking differently: Take 0.6 mg by mouth daily as needed (for gout flares). 01/06/21   Westley Chandler, MD   furosemide (LASIX) 40 MG tablet Take 40 mg by mouth daily. 06/25/23   [provider]  hydrOXYzine (ATARAX) 25 MG tablet Take 25-50 mg by mouth every 6 (six) hours as needed for itching (or sleep).    [provider]  losartan (COZAAR) 50 MG tablet Take 2 tablets (100 mg total) by mouth every evening. 07/23/23   Azucena Fallen, MD  mycophenolate (MYFORTIC) 360 MG TBEC EC tablet Take 720 mg by mouth 2 (two) times daily.    [provider]  NARCAN 4 MG/0.1ML LIQD nasal spray kit Place 1 spray into the nose as needed (overdose). 12/02/19   [provider]  pantoprazole (PROTONIX) 40 MG tablet Take 40 mg by mouth in the morning. 11/02/19   [provider]  predniSONE (DELTASONE) 5 MG tablet Take 5 mg by mouth daily. 12/07/19   [provider]  pregabalin (LYRICA) 75 MG capsule Take 75 mg by mouth 2 (two) times daily. 07/04/23   [provider]  rivaroxaban (XARELTO) 2.5 MG TABS tablet Take 1 tablet (2.5 mg total) by mouth 2 (two) times daily. Patient taking differently: Take 5 mg by mouth every morning. 11/09/22   Levin Erp, MD  rosuvastatin (CRESTOR) 5 MG tablet Take 5 mg by mouth daily.    [provider]  sodium bicarbonate 650 MG tablet Take 1,300 mg by mouth 2 (two) times daily.    [provider]  tacrolimus (PROGRAF) 1 MG capsule Take 2-3 mg by mouth See admin instructions. Take 3 mg by mouth in the morning and 2 mg at bedtime    [provider]  tamsulosin (FLOMAX) 0.4 MG CAPS capsule Take 0.4 mg by mouth at bedtime. 08/28/19   [provider]      Allergies    Percocet [oxycodone-acetaminophen], Vicodin [hydrocodone-acetaminophen], Shellfish allergy, Chocolate flavoring agent (non-screening), Chocolate, and Tomato    Review of Systems   Review of Systems  Respiratory:  Positive for cough.     Physical Exam Updated Vital Signs BP (!) 155/98 (BP Location: Right Arm)   Pulse 73   Temp  98.3 F (36.8 C)   Resp 19   SpO2 100%  Physical Exam Vitals and nursing note reviewed.  Constitutional:      General: He is not in acute distress.    Appearance: He is not toxic-appearing.  HENT:     Head: Normocephalic and atraumatic.  Eyes:     General: No scleral icterus.    Conjunctiva/sclera: Conjunctivae normal.  Cardiovascular:     Rate and Rhythm: Normal rate and regular rhythm.     Pulses: Normal pulses.     Heart sounds: Normal heart sounds.  Pulmonary:     Effort: Pulmonary effort is normal. No respiratory distress.     Breath sounds: Normal breath sounds.     Comments: Clear to auscultation bilaterally.  No sign of respiratory distress. Abdominal:     General: Abdomen is flat. Bowel sounds are normal.     Palpations: Abdomen is soft.     Tenderness: There is no abdominal tenderness.  Musculoskeletal:     Right lower leg: No edema.     Left lower leg: No edema.     Comments: Bilateral great toe tenderness to palpation, focal swelling. No erythema or open foot wound.   Skin:    General: Skin is warm and dry.     Findings: No lesion.  Neurological:     General: No focal deficit present.     Mental Status: He is alert and oriented to person, place, and time. Mental status is at baseline.     ED Results / Procedures / Treatments   Labs (all labs ordered are listed, but only abnormal results are displayed) Labs Reviewed  BASIC METABOLIC PANEL - Abnormal; Notable for the following components:      Result Value   Glucose, Bld 160 (*)    BUN 29 (*)    Creatinine, Ser 1.94 (*)    GFR, Estimated 38 (*)    All other components within normal limits  CBC WITH DIFFERENTIAL/PLATELET - Abnormal; Notable for the following components:   Lymphs Abs 0.5 (*)    All other components within normal limits  URIC ACID - Abnormal; Notable for the following components:   Uric Acid, Serum 9.4 (*)    All other components within normal limits  RESP PANEL BY RT-PCR (RSV, FLU A&B,  COVID)  RVPGX2    EKG None  Radiology DG Foot Complete Right Result Date: 11/21/2023 CLINICAL DATA:  Great toe pain. EXAM: RIGHT FOOT COMPLETE - 3+ VIEW COMPARISON:  None Available. FINDINGS: Joint space narrowing and spurring of the first metatarsal phalangeal joint without osteoarthritis. No erosive or bony destructive change. Foot alignment is normal. Minor degenerative spurring in the midfoot. No focal soft tissue abnormalities. 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 Electronically Signed   By: Ivette Loyal.D.  On: 11/21/2023 15:58   DG Foot Complete Left Result Date: 11/21/2023 CLINICAL DATA:  Pain in the great toe. EXAM: LEFT FOOT - COMPLETE 3+ VIEW COMPARISON:  None Available. FINDINGS: No acute fracture or dislocation. The bones are osteopenic. Arthritic changes of the first MTP joint. The soft tissues are unremarkable. IMPRESSION: 1. No acute fracture or dislocation. 2. Arthritic changes of the first MTP joint. Electronically Signed   By: Elgie Collard M.D.   On: 11/21/2023 15:55   DG Chest 1 View Result Date: 11/21/2023 CLINICAL DATA:  Chest pain. EXAM: CHEST  1 VIEW COMPARISON:  07/15/2023 FINDINGS: Normal heart size post median sternotomy. Left atrial clipping. Aortic atherosclerosis. Mild patchy opacity at the left lung base. The right lung is clear. Pulmonary vasculature is normal. No pleural fluid, pneumothorax, or pulmonary edema. No acute osseous findings. IMPRESSION: Mild patchy opacity at the left lung base, atelectasis versus pneumonia. Electronically Signed   By: Narda Rutherford M.D.   On: 11/21/2023 15:54    Procedures Procedures    Medications Ordered in ED Medications - No data to display  ED Course/ Medical Decision Making/ A&P                                 Medical Decision Making Risk OTC drugs. Prescription drug management.   Zenia Resides 63 y.o. presented today for URI like symptoms and great toe  pain. Working DDx that I considered at this time includes, but not limited to, viral illness, pharyngitis, mono, sinusitis, electrolyte abnormality, AOM, osteoarthritis, septic joint, gout, pseudogout  R/o DDx: these additional diagnoses are not consistent with patient's history, presentation, physical exam, labs/imaging findings.  Review of prior external notes: none  Labs:  Respiratory Panel: Respiratory panel is negative  Imaging:  Right and left foot x-ray which show bilateral great toe arthritis Chest x-ray which shows concerning for pneumonia.  Problem List / ED Course / Critical interventions / Medication management  Patient has had 1 week of cough, congestion and loss of taste and smell.  He reports he was previously seen at urgent care 4 days ago and started on Augmentin and doxycycline for pneumonia.  He has not missed any doses.  He feels that his symptoms are starting to improve however he has had a lingering cough.  Denies any chest pain, shortness of breath, fevers chills.  Given reassuring lab work, vital signs and presentation feel that patient is appropriate for continued oral antibiotics for treatment of pneumonia.  Given lack of improvement with doxycycline and Augmentin will switch to Levaquin.  I did offer patient admission to hospital due to failed outpatient treatment of pneumonia however patient declined.  Patient was given strict return precautions if symptoms do not begin to improve in the next 2 days. Presenting with foot pain.  Exam is consistent with bilateral great toe gout.  Patient has history of gout.  Symptoms occurred suddenly overnight.  He has not tried anything for his pain.  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  He is neurovascularly intact.  Pain was controlled with Norco.  No sign of foot wound.  Patient stable for discharge.  I ordered medication including Norco for foot pain Reevaluation of the  patient after these medicines showed that the patient improved Patients vitals assessed. Upon arrival patient is hemodynamically stable.  I have reviewed the patients home medicines and have made  adjustments as needed     Plan:  F/u w/ PCP in 2-3d to ensure resolution of sx.  Patient was given return precautions. Patient stable for discharge at this time.  Patient educated on sx and dx and verbalized understanding of plan. Return to ER if new or worsening sx.          Final Clinical Impression(s) / ED Diagnoses Final diagnoses:  Pneumonia due to infectious organism, unspecified laterality, unspecified part of lung  Acute gout, unspecified cause, unspecified site    Rx / DC Orders ED Discharge Orders          Ordered    predniSONE (DELTASONE) 20 MG tablet  Daily        11/21/23 1937    levofloxacin (LEVAQUIN) 500 MG tablet  Daily        11/21/23 1937    HYDROcodone-acetaminophen (NORCO/VICODIN) 5-325 MG tablet  Every 6 hours PRN        11/21/23 1937              Smitty Knudsen, PA-C 11/21/23 2042    Bralen Wiltgen, [name], PA-C 11/21/23 2043    Gerhard Munch, MD 11/21/23 2318

## 2023-11-21 NOTE — Discharge Instructions (Addendum)
 You are seen in the emergency room today for gout.  I sent prednisone to your pharmacy please take as prescribed.  I also recommend taking Tylenol for pain control.  If you have breakthrough pain you can take Norco.   I have sent a new antibiotic to your pharmacy for pneumonia.  Please take antibiotic for 5 days.  If you start developing new or worsening symptoms please return to emergency room.

## 2023-11-21 NOTE — ED Triage Notes (Signed)
 Pt reports pain in bilateral big toes. Pt also reports nasal congestion and unable to taste his food x 1 week.

## 2023-11-21 NOTE — ED Provider Triage Note (Signed)
 Emergency Medicine Provider Triage Evaluation Note  BUD KAESER , a 63 y.o. male  was evaluated in triage.  Pt complains of bilater great toe pain along with productive cough for an unknown amount of time.  Patient denies fevers but states he is coughing up productive sputum but is unsure what color it is.  Patient is he was recent on antibiotics but cannot say what.  Patient denies chest pain shortness of breath.  Patient states he has pain in both his great toes is similar to his gout.  Patient denies any recent alcohol, seafood.  Patient is on Xarelto and has been taking this as prescribed.  Review of Systems  Positive:  Negative:   Physical Exam  There were no vitals taken for this visit. Gen:   Awake, no distress   Resp:  Normal effort  MSK:   Moves extremities without difficulty  Other:  Mild swelling and erythema to bilateral first M TP joints with tenderness, no signs of limb ischemia  Medical Decision Making  Medically screening exam initiated at 2:50 PM.  Appropriate orders placed.  JYLAN LOEZA was informed that the remainder of the evaluation will be completed by another provider, this initial triage assessment does not replace that evaluation, and the importance of remaining in the ED until their evaluation is complete.  Workup initiated, suspect possible gout, will get chest x-ray and respiratory swab due to patient's cough, patient stable at this time.   Netta Corrigan, PA-C 11/21/23 1451

## 2024-01-01 ENCOUNTER — Ambulatory Visit: Payer: 59

## 2024-01-01 ENCOUNTER — Ambulatory Visit (HOSPITAL_COMMUNITY): Payer: 59 | Attending: Vascular Surgery

## 2024-01-01 ENCOUNTER — Ambulatory Visit (HOSPITAL_COMMUNITY): Payer: 59

## 2024-01-01 NOTE — Progress Notes (Deleted)
 Office Note   History of Present Illness   Theodore Arellano is a 63 y.o. (07/23/1961) male who presents for surveillance of PAD.     Current Outpatient Medications  Medication Sig Dispense Refill   acetaminophen (TYLENOL) 500 MG tablet Take 500 mg by mouth every 6 (six) hours as needed for moderate pain or headache.     aspirin EC 81 MG tablet Take 81 mg by mouth daily.     blood glucose meter kit and supplies KIT Dispense based on patient and insurance preference. Use up to four times daily as directed. (FOR ICD-9 250.00, 250.01). 1 each 0   brimonidine (ALPHAGAN) 0.2 % ophthalmic solution Place 1 drop into the right eye 3 (three) times daily. (Patient not taking: Reported on 07/15/2023) 10 mL 12   colchicine 0.6 MG tablet Take 1 tablet (0.6 mg total) by mouth daily as needed. (Patient taking differently: Take 0.6 mg by mouth daily as needed (for gout flares).)     furosemide (LASIX) 40 MG tablet Take 40 mg by mouth daily.     HYDROcodone-acetaminophen (NORCO/VICODIN) 5-325 MG tablet Take 1 tablet by mouth every 6 (six) hours as needed for severe pain (pain score 7-10). 7 tablet 0   hydrOXYzine (ATARAX) 25 MG tablet Take 25-50 mg by mouth every 6 (six) hours as needed for itching (or sleep).     losartan (COZAAR) 50 MG tablet Take 2 tablets (100 mg total) by mouth every evening. 30 tablet 0   mycophenolate (MYFORTIC) 360 MG TBEC EC tablet Take 720 mg by mouth 2 (two) times daily.     NARCAN 4 MG/0.1ML LIQD nasal spray kit Place 1 spray into the nose as needed (overdose).     pantoprazole (PROTONIX) 40 MG tablet Take 40 mg by mouth in the morning.     predniSONE (DELTASONE) 20 MG tablet Take 2 tablets (40 mg total) by mouth daily. 10 tablet 0   pregabalin (LYRICA) 75 MG capsule Take 75 mg by mouth 2 (two) times daily.     rivaroxaban (XARELTO) 2.5 MG TABS tablet Take 1 tablet (2.5 mg total) by mouth 2 (two) times daily. (Patient taking differently: Take 5 mg by mouth every morning.) 60  tablet    rosuvastatin (CRESTOR) 5 MG tablet Take 5 mg by mouth daily.     sodium bicarbonate 650 MG tablet Take 1,300 mg by mouth 2 (two) times daily.     tacrolimus (PROGRAF) 1 MG capsule Take 2-3 mg by mouth See admin instructions. Take 3 mg by mouth in the morning and 2 mg at bedtime     tamsulosin (FLOMAX) 0.4 MG CAPS capsule Take 0.4 mg by mouth at bedtime.     No current facility-administered medications for this visit.    ***REVIEW OF SYSTEMS (negative unless checked):   Cardiac:  []  Chest pain or chest pressure? []  Shortness of breath upon activity? []  Shortness of breath when lying flat? []  Irregular heart rhythm?  Vascular:  []  Pain in calf, thigh, or hip brought on by walking? []  Pain in feet at night that wakes you up from your sleep? []  Blood clot in your veins? []  Leg swelling?  Pulmonary:  []  Oxygen at home? []  Productive cough? []  Wheezing?  Neurologic:  []  Sudden weakness in arms or legs? []  Sudden numbness in arms or legs? []  Sudden onset of difficult speaking or slurred speech? []  Temporary loss of vision in one eye? []  Problems with dizziness?  Gastrointestinal:  []   Blood in stool? []  Vomited blood?  Genitourinary:  []  Burning when urinating? []  Blood in urine?  Psychiatric:  []  Major depression  Hematologic:  []  Bleeding problems? []  Problems with blood clotting?  Dermatologic:  []  Rashes or ulcers?  Constitutional:  []  Fever or chills?  Ear/Nose/Throat:  []  Change in hearing? []  Nose bleeds? []  Sore throat?  Musculoskeletal:  []  Back pain? []  Joint pain? []  Muscle pain?   Physical Examination  ***There were no vitals filed for this visit. ***There is no height or weight on file to calculate BMI.  General:  WDWN in NAD; vital signs documented above Gait: Not observed HENT: WNL, normocephalic Pulmonary: normal non-labored breathing , without rales, rhonchi,  wheezing Cardiac: {Desc; regular/irreg:14544} HR, without  murmurs {With/Without:20273} carotid bruit*** Abdomen: soft, NT, no masses Skin: {With/Without:20273} rashes Vascular Exam/Pulses: Palpable/nonpalpable femoral pulses, palpable/nonpalpable popliteal pulses, palpable/nonpalpable pedal pulses. Right DP/PT/Peroneal doppler signals. Left DP/PT/Peroneal doppler signals Extremities: {With/Without:20273} ischemic changes, {With/Without:20273} gangrene , {With/Without:20273} cellulitis; {With/Without:20273} open wounds;  Musculoskeletal: no muscle wasting or atrophy  Neurologic: A&O X 3;  No focal weakness or paresthesias are detected Psychiatric:  The pt has {Desc; normal/abnormal:11317::"Normal"} affect.  Non-Invasive Vascular Imaging ABI (***) R:  ABI: *** (***),  PT: {Signals:19197::"none","mono","bi","tri"} DP: {Signals:19197::"none","mono","bi","tri"} TBI:  *** L:  ABI: *** (***),  PT: {Signals:19197::"none","mono","bi","tri"} DP: {Signals:19197::"none","mono","bi","tri"} TBI: ***  Aortoiliac Duplex (***)   Bypass Duplex (***)    Medical Decision Making   Theodore Arellano is a 63 y.o. male who presents for surveillance of PAD  Based on the patient's vascular studies and examination, their ABIs are *** since the last visit *** bypass graft duplex demonstrates a *** The patient will continue to take *** and follow up with our office in *** months/year with ABIs and *** BPG duplex   Loel Dubonnet PA-C [facility] Office: [phone]  Clinic MD: ***

## 2024-03-15 DIAGNOSIS — I440 Atrioventricular block, first degree: Secondary | ICD-10-CM | POA: Diagnosis not present

## 2024-03-16 DIAGNOSIS — F1410 Cocaine abuse, uncomplicated: Secondary | ICD-10-CM | POA: Diagnosis not present

## 2024-03-16 DIAGNOSIS — I340 Nonrheumatic mitral (valve) insufficiency: Secondary | ICD-10-CM | POA: Diagnosis not present

## 2024-03-16 DIAGNOSIS — I351 Nonrheumatic aortic (valve) insufficiency: Secondary | ICD-10-CM | POA: Diagnosis not present

## 2024-03-16 DIAGNOSIS — R7989 Other specified abnormal findings of blood chemistry: Secondary | ICD-10-CM | POA: Diagnosis not present

## 2024-03-16 DIAGNOSIS — I10 Essential (primary) hypertension: Secondary | ICD-10-CM | POA: Diagnosis not present

## 2024-03-17 DIAGNOSIS — I10 Essential (primary) hypertension: Secondary | ICD-10-CM | POA: Diagnosis not present

## 2024-03-17 DIAGNOSIS — R7989 Other specified abnormal findings of blood chemistry: Secondary | ICD-10-CM | POA: Diagnosis not present

## 2024-03-18 DIAGNOSIS — R7989 Other specified abnormal findings of blood chemistry: Secondary | ICD-10-CM | POA: Diagnosis not present

## 2024-03-18 DIAGNOSIS — I429 Cardiomyopathy, unspecified: Secondary | ICD-10-CM | POA: Diagnosis not present

## 2024-03-18 DIAGNOSIS — I10 Essential (primary) hypertension: Secondary | ICD-10-CM | POA: Diagnosis not present

## 2024-03-19 DIAGNOSIS — R7989 Other specified abnormal findings of blood chemistry: Secondary | ICD-10-CM | POA: Diagnosis not present

## 2024-03-19 DIAGNOSIS — I21A1 Myocardial infarction type 2: Secondary | ICD-10-CM | POA: Diagnosis not present

## 2024-03-20 ENCOUNTER — Encounter: Payer: Self-pay | Admitting: Emergency Medicine

## 2024-04-01 ENCOUNTER — Ambulatory Visit

## 2024-04-01 ENCOUNTER — Ambulatory Visit (HOSPITAL_COMMUNITY)

## 2024-04-15 ENCOUNTER — Ambulatory Visit (INDEPENDENT_AMBULATORY_CARE_PROVIDER_SITE_OTHER): Admitting: Orthopaedic Surgery

## 2024-04-15 ENCOUNTER — Encounter: Payer: Self-pay | Admitting: Orthopaedic Surgery

## 2024-04-15 ENCOUNTER — Other Ambulatory Visit (INDEPENDENT_AMBULATORY_CARE_PROVIDER_SITE_OTHER)

## 2024-04-15 DIAGNOSIS — M79604 Pain in right leg: Secondary | ICD-10-CM | POA: Diagnosis not present

## 2024-04-15 NOTE — Progress Notes (Signed)
 Office Visit Note   Patient: Theodore Arellano           Date of Birth: 1960-10-27           MRN: [id number] Visit Date: 04/15/2024              Requested by: [name], MD [address] [place],  [place] [address] PCP: [name], MD   Assessment & Plan: Visit Diagnoses:  1. Pain in right leg     Plan: History of Present Illness DARRELL HAUK is a 63 year old male who presents with a questionable mass on his right calf.  Patient was referred here by [facility] health care.  Patient is a permanent resident there.  Patient is poor historian but does state he had a vascular procedure in that area recently.   Review of the records shows that he underwent a vascular procedure by Dr. Sheree approximately 8 months ago.  Patient denies history of cancer  Examination of the right lower leg shows a previous surgical scar on the medial aspect of the upper calf.  There is no obvious palpable masses.  No signs of infection.  Assessment and Plan Questionable right calf mass. - Doubt that there is an actual mass.  Patient recently had surgery in that area and could be due to postsurgical changes.  Recommend reevaluation by vascular surgery.  Follow-Up Instructions: No follow-ups on file.   Orders:  Orders Placed This Encounter  Procedures   XR Tibia/Fibula Right   Ambulatory referral to Vascular Surgery   No orders of the defined types were placed in this encounter.     Procedures: No procedures performed   Clinical Data: No additional findings.   Subjective: Chief Complaint  Patient presents with   Right Leg - Pain    HPI  Review of Systems  Constitutional: Negative.   HENT: Negative.    Eyes: Negative.   Respiratory: Negative.    Cardiovascular: Negative.   Gastrointestinal: Negative.   Endocrine: Negative.   Genitourinary: Negative.   Skin: Negative.   Allergic/Immunologic: Negative.   Neurological: Negative.   Hematological: Negative.    Psychiatric/Behavioral: Negative.    All other systems reviewed and are negative.    Objective: Vital Signs: There were no vitals taken for this visit.  Physical Exam Vitals and nursing note reviewed.  Constitutional:      Appearance: He is well-developed.  HENT:     Head: Normocephalic and atraumatic.  Eyes:     Pupils: Pupils are equal, round, and reactive to light.  Pulmonary:     Effort: Pulmonary effort is normal.  Abdominal:     Palpations: Abdomen is soft.  Musculoskeletal:        General: Normal range of motion.     Cervical back: Neck supple.  Skin:    General: Skin is warm.  Neurological:     Mental Status: He is alert and oriented to person, place, and time.  Psychiatric:        Behavior: Behavior normal.        Thought Content: Thought content normal.        Judgment: Judgment normal.     Ortho Exam  Specialty Comments:  No specialty comments available.  Imaging: XR Tibia/Fibula Right Result Date: 04/15/2024 X-rays of the right tib-fib show mild degenerative changes of the knee joint.  No acute abnormalities.  There is vascular calcifications posterior to the knee.  There are surgical clips within  the soft tissue.  No obvious masses.    PMFS History: Patient Active Problem List   Diagnosis Date Noted   Vascular insufficiency of extremity 07/15/2023   Bradycardia 11/08/2022   HTN (hypertension) 11/08/2022   Hypomagnesemia 11/08/2022   Chronic systolic CHF (congestive heart failure) (HCC) 11/08/2022   Impaired functional mobility, balance, gait, and endurance 02/07/2021   Hypertension    Critical lower limb ischemia (HCC) 01/05/2021   Anticoagulant long-term use    PAD (peripheral artery disease) (HCC)    Leg pain, anterior, right 12/27/2020   Non-healing wound of right lower extremity 12/27/2020   Cellulitis of right lower extremity 12/27/2020   Arterial leg ulcer (HCC) 12/27/2020   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    Osteomyelitis of left fibula (HCC) 01/13/2020   Cellulitis of left lower extremity 01/11/2020   Sepsis (HCC) 01/11/2020   DM (diabetes mellitus) type II, controlled, with peripheral vascular disorder (HCC) 01/11/2020   Chronic pain syndrome 01/11/2020   Sepsis due to cellulitis (HCC) 01/11/2020   ESRD (end stage renal disease) (HCC) 01/06/2020   Acute kidney injury (HCC) 11/08/2019   COVID-19 virus infection 11/08/2019   History of DVT (deep vein thrombosis) 11/08/2019   Hx of CABG 09/04/2019   Disorder of arteries and arterioles, unspecified (HCC) 07/21/2019   Wound of left leg 07/21/2019   Left leg pain 06/28/2019   Abnormal ECG 06/05/2019   Mobitz type I Wenckebach atrioventricular block 06/05/2019   Unstable angina pectoris due to coronary arteriosclerosis (HCC) 05/30/2019   Left ventricular dysfunction 01/08/2018   Coronary artery disease    Coronary artery disease due to lipid rich plaque    Elevated troponin    Tobacco abuse 09/02/2017   Chest pain 08/27/2016   Renal transplant recipient 08/27/2016   Essential hypertension 08/27/2016   HLD (hyperlipidemia) 08/27/2016   Type 2 diabetes mellitus with vascular disease (HCC) 08/27/2016   Abnormal nuclear stress test 11/02/2015   CKD (chronic kidney disease) 11/02/2015   Current smoker 11/02/2015   Dyslipidemia 11/02/2015   Immunosuppression (HCC) 09/02/2014   Rash 09/02/2014   Left groin pain 06/24/2013   Anemia 05/21/2012   Leucopenia 05/21/2012   Personal history of immunosupression therapy 05/21/2012   Past Medical History:  Diagnosis Date   Anxiety    Arthritis    lower back (11/28/2017)   CAD (coronary artery disease)    Chronic lower back pain    H/O immunosuppressive therapy    chronic/notes 11/28/2017   History of gout    before kidney transport (11/28/2017)   Hyperlipidemia    Hypertension    Kidney disease    s/p kidney transplant 2011; not on dialysis now (11/28/2017)   Peripheral arterial disease  (HCC)    Tobacco use     Family History  Problem Relation Age of Onset   Hypertension Father    Diabetes Mother     Past Surgical History:  Procedure Laterality Date   ABDOMINAL AORTOGRAM W/LOWER EXTREMITY Bilateral 12/29/2020   Procedure: ABDOMINAL AORTOGRAM W/LOWER EXTREMITY;  Surgeon: Gretta Lonni PARAS, MD;  Location: [facility] INVASIVE CV LAB;  Service: Cardiovascular;  Laterality: Bilateral;   ARTERIOVENOUS GRAFT PLACEMENT Right 04/2007   /notes 02/01/2011   AV FISTULA PLACEMENT Left 12/20/2005; 12/2006   thelbert 02/13/2011; thelbert 02/13/2011   AXILLARY-FEMORAL BYPASS GRAFT Right 01/03/2021   Procedure: RIGHT AXILLARY TO PROFUNDA FEMORAL BYPASS;  Surgeon: Oris Krystal FALCON, MD;  Location: [facility] OR;  Service: Vascular;  Laterality: Right;   AXILLARY-FEMORAL BYPASS GRAFT Right  07/16/2023   Procedure: REDO RIGHT SARAHANN MOMENT WITH PATCH ANGIOPLSTY RIGHT POPLITEAL ARTERY.;  Surgeon: Sheree Penne Bruckner, MD;  Location: [facility] OR;  Service: Vascular;  Laterality: Right;   CARDIAC CATHETERIZATION     EMBOLECTOMY  07/16/2023   Procedure: EMBOLECTOMY RIGHT Axilliofemoral and femoral/popliteal bypass graft.;  Surgeon: Sheree Penne Bruckner, MD;  Location: [facility] OR;  Service: Vascular;;   FEMORAL-POPLITEAL BYPASS GRAFT Right 01/03/2021   Procedure: RIGHT FEMORAL TO BELOW KNEE POPLITEAL ARTERY BYPASS;  Surgeon: Oris Krystal FALCON, MD;  Location: [facility] OR;  Service: Vascular;  Laterality: Right;   FEMORAL-POPLITEAL BYPASS GRAFT Right 07/19/2023   Procedure: REDO RIGHT FEMORAL-POPLITEAL ARTERY BYPASS GRAFT;  Surgeon: Sheree Penne Bruckner, MD;  Location: [facility] OR;  Service: Vascular;  Laterality: Right;   HD access procedures     INGUINAL HERNIA REPAIR Left    KIDNEY TRANSPLANT  2011   LEFT HEART CATH AND CORONARY ANGIOGRAPHY N/A 11/29/2017   Procedure: LEFT HEART CATH AND CORONARY ANGIOGRAPHY;  Surgeon: Anner Alm ORN, MD;  Location: [facility] INVASIVE CV LAB;  Service: Cardiovascular;  Laterality: N/A;    LEFT HEART CATH AND CORONARY ANGIOGRAPHY N/A 06/01/2019   Procedure: LEFT HEART CATH AND CORONARY ANGIOGRAPHY;  Surgeon: Verlin Bruckner BIRCH, MD;  Location: [facility] INVAS[facility] CV [facility];  Service: Cardiovascular;  Laterality: N/A;   LEFT HEART CATHETERIZATION WITH CORONARY ANGIOGRAM  03/11/2012   Procedure: LEFT HEART CATHETERIZATION WITH CORONARY ANGIOGRAM;  Surgeon: Toribio JONELLE Fuel, MD;  Location: [facility] CATH LAB;  Service: Cardiovascular;;   LOWER EXTREMITY ANGIOGRAM Right 07/19/2023   Procedure: LOWER EXTREMITY ANGIOGRAM;  Surgeon: Sheree Penne Bruckner, MD;  Location: [facility] OR;  Service: Vascular;  Laterality: Right;   PATCH ANGIOPLASTY Right 07/19/2023   Procedure: PATCH ANGIOPLASTY USING GEORGE BIOLOGIC PATCH;  Surgeon: Sheree Penne Bruckner, MD;  Location[facility] OR;  Service: Vascular;  Laterality: Right;   RIGHT HEART CATHETERIZATION N/A 03/11/2012   Procedure: RIGHT HEART CATH;  Surgeon: Toribio JONELLE Fuel, MD;  Location: [facility] CATH LAB;  Service: Cardiovascular;  Laterality: N/A;   THROMBECTOMY Right 12/2007   thelbert 02/01/2011   THROMBECTOMY / ARTERIOVENOUS GRAFT REVISION Left 12/2006   thelbert 02/13/2011   THROMBECTOMY / ARTERIOVENOUS GRAFT REVISION Right 07/2007; 10/2007;01/2008;   thelbert 01/31/2011; thelbert 02/01/2011; thelbert 01/31/2011   THROMBECTOMY AND REVISION OF ARTERIOVENTOUS (AV) GORETEX  GRAFT  03/2007 X 2   thelbert 02/01/2011   THROMBECTOMY OF BYPASS GRAFT FEMORAL- POPLITEAL ARTERY Right 07/19/2023   Procedure: THROMBECTOMY OF BYPASS GRAFT FEMORAL-POPLITEAL ARTERY;  Surgeon: Sheree Penne Bruckner, MD;  Location: [facility] OR;  Service: Vascular;  Laterality: Right;   Social History   Occupational History   Occupation: Unemployed truck driver  Tobacco Use   Smoking status: Every Day    Current packs/day: 0.50    Average packs/day: 0.5 packs/day for 40.0 years (20.0 ttl pk-yrs)    Types: Cigarettes   Smokeless tobacco: Never  Vaping Use   Vaping status: Never Used  Substance and Sexual  Activity   Alcohol use: Yes    Alcohol/week: 12.0 standard drinks of alcohol    Types: 12 Cans of beer per week   Drug use: No   Sexual activity: Never    Birth control/protection: None

## 2024-04-16 ENCOUNTER — Other Ambulatory Visit: Payer: Self-pay | Admitting: Internal Medicine

## 2024-04-16 DIAGNOSIS — L89150 Pressure ulcer of sacral region, unstageable: Secondary | ICD-10-CM

## 2024-04-22 ENCOUNTER — Ambulatory Visit: Admitting: Physician Assistant

## 2024-04-23 ENCOUNTER — Other Ambulatory Visit

## 2024-05-02 ENCOUNTER — Inpatient Hospital Stay (HOSPITAL_COMMUNITY)
Admission: EM | Admit: 2024-05-02 | Discharge: 2024-05-09 | DRG: 871 | Disposition: A | Discharge status: Skilled Nursing Facility | Source: Skilled Nursing Facility | Attending: Family Medicine | Admitting: Family Medicine

## 2024-05-02 ENCOUNTER — Encounter (HOSPITAL_COMMUNITY): Payer: Self-pay | Admitting: Internal Medicine

## 2024-05-02 ENCOUNTER — Inpatient Hospital Stay (HOSPITAL_COMMUNITY)

## 2024-05-02 ENCOUNTER — Other Ambulatory Visit: Payer: Self-pay

## 2024-05-02 ENCOUNTER — Emergency Department (HOSPITAL_COMMUNITY)

## 2024-05-02 DIAGNOSIS — I255 Ischemic cardiomyopathy: Secondary | ICD-10-CM | POA: Diagnosis present

## 2024-05-02 DIAGNOSIS — N1832 Chronic kidney disease, stage 3b: Secondary | ICD-10-CM | POA: Diagnosis present

## 2024-05-02 DIAGNOSIS — K5900 Constipation, unspecified: Secondary | ICD-10-CM | POA: Diagnosis present

## 2024-05-02 DIAGNOSIS — Z91013 Allergy to seafood: Secondary | ICD-10-CM

## 2024-05-02 DIAGNOSIS — R2689 Other abnormalities of gait and mobility: Secondary | ICD-10-CM | POA: Diagnosis present

## 2024-05-02 DIAGNOSIS — I77810 Thoracic aortic ectasia: Secondary | ICD-10-CM | POA: Diagnosis present

## 2024-05-02 DIAGNOSIS — D649 Anemia, unspecified: Secondary | ICD-10-CM

## 2024-05-02 DIAGNOSIS — Z515 Encounter for palliative care: Secondary | ICD-10-CM | POA: Diagnosis not present

## 2024-05-02 DIAGNOSIS — I69398 Other sequelae of cerebral infarction: Secondary | ICD-10-CM

## 2024-05-02 DIAGNOSIS — Z833 Family history of diabetes mellitus: Secondary | ICD-10-CM

## 2024-05-02 DIAGNOSIS — Y830 Surgical operation with transplant of whole organ as the cause of abnormal reaction of the patient, or of later complication, without mention of misadventure at the time of the procedure: Secondary | ICD-10-CM | POA: Diagnosis present

## 2024-05-02 DIAGNOSIS — I130 Hypertensive heart and chronic kidney disease with heart failure and stage 1 through stage 4 chronic kidney disease, or unspecified chronic kidney disease: Secondary | ICD-10-CM | POA: Diagnosis present

## 2024-05-02 DIAGNOSIS — L89610 Pressure ulcer of right heel, unstageable: Secondary | ICD-10-CM | POA: Diagnosis present

## 2024-05-02 DIAGNOSIS — Z66 Do not resuscitate: Secondary | ICD-10-CM | POA: Diagnosis present

## 2024-05-02 DIAGNOSIS — I493 Ventricular premature depolarization: Secondary | ICD-10-CM | POA: Diagnosis present

## 2024-05-02 DIAGNOSIS — Z7401 Bed confinement status: Secondary | ICD-10-CM

## 2024-05-02 DIAGNOSIS — R5381 Other malaise: Secondary | ICD-10-CM | POA: Diagnosis present

## 2024-05-02 DIAGNOSIS — R1310 Dysphagia, unspecified: Secondary | ICD-10-CM | POA: Diagnosis not present

## 2024-05-02 DIAGNOSIS — G4733 Obstructive sleep apnea (adult) (pediatric): Secondary | ICD-10-CM | POA: Diagnosis present

## 2024-05-02 DIAGNOSIS — N179 Acute kidney failure, unspecified: Secondary | ICD-10-CM | POA: Diagnosis present

## 2024-05-02 DIAGNOSIS — T68XXXA Hypothermia, initial encounter: Secondary | ICD-10-CM | POA: Diagnosis not present

## 2024-05-02 DIAGNOSIS — G9340 Encephalopathy, unspecified: Secondary | ICD-10-CM | POA: Diagnosis not present

## 2024-05-02 DIAGNOSIS — D631 Anemia in chronic kidney disease: Secondary | ICD-10-CM | POA: Diagnosis present

## 2024-05-02 DIAGNOSIS — R4701 Aphasia: Secondary | ICD-10-CM | POA: Diagnosis present

## 2024-05-02 DIAGNOSIS — Z885 Allergy status to narcotic agent status: Secondary | ICD-10-CM

## 2024-05-02 DIAGNOSIS — G9341 Metabolic encephalopathy: Secondary | ICD-10-CM | POA: Diagnosis present

## 2024-05-02 DIAGNOSIS — R339 Retention of urine, unspecified: Secondary | ICD-10-CM | POA: Diagnosis not present

## 2024-05-02 DIAGNOSIS — G8929 Other chronic pain: Secondary | ICD-10-CM | POA: Diagnosis present

## 2024-05-02 DIAGNOSIS — Z6824 Body mass index (BMI) 24.0-24.9, adult: Secondary | ICD-10-CM

## 2024-05-02 DIAGNOSIS — R64 Cachexia: Secondary | ICD-10-CM | POA: Diagnosis present

## 2024-05-02 DIAGNOSIS — E43 Unspecified severe protein-calorie malnutrition: Secondary | ICD-10-CM | POA: Diagnosis present

## 2024-05-02 DIAGNOSIS — R471 Dysarthria and anarthria: Secondary | ICD-10-CM | POA: Diagnosis present

## 2024-05-02 DIAGNOSIS — D84821 Immunodeficiency due to drugs: Secondary | ICD-10-CM | POA: Diagnosis present

## 2024-05-02 DIAGNOSIS — T8619 Other complication of kidney transplant: Secondary | ICD-10-CM | POA: Diagnosis present

## 2024-05-02 DIAGNOSIS — I5020 Unspecified systolic (congestive) heart failure: Secondary | ICD-10-CM | POA: Diagnosis not present

## 2024-05-02 DIAGNOSIS — R627 Adult failure to thrive: Secondary | ICD-10-CM | POA: Diagnosis present

## 2024-05-02 DIAGNOSIS — Z7982 Long term (current) use of aspirin: Secondary | ICD-10-CM

## 2024-05-02 DIAGNOSIS — E785 Hyperlipidemia, unspecified: Secondary | ICD-10-CM | POA: Diagnosis not present

## 2024-05-02 DIAGNOSIS — M159 Polyosteoarthritis, unspecified: Secondary | ICD-10-CM | POA: Diagnosis present

## 2024-05-02 DIAGNOSIS — E1151 Type 2 diabetes mellitus with diabetic peripheral angiopathy without gangrene: Secondary | ICD-10-CM | POA: Diagnosis present

## 2024-05-02 DIAGNOSIS — A419 Sepsis, unspecified organism: Secondary | ICD-10-CM | POA: Diagnosis present

## 2024-05-02 DIAGNOSIS — R6520 Severe sepsis without septic shock: Secondary | ICD-10-CM | POA: Diagnosis not present

## 2024-05-02 DIAGNOSIS — Z951 Presence of aortocoronary bypass graft: Secondary | ICD-10-CM

## 2024-05-02 DIAGNOSIS — I5021 Acute systolic (congestive) heart failure: Secondary | ICD-10-CM | POA: Diagnosis not present

## 2024-05-02 DIAGNOSIS — Z8249 Family history of ischemic heart disease and other diseases of the circulatory system: Secondary | ICD-10-CM

## 2024-05-02 DIAGNOSIS — L89154 Pressure ulcer of sacral region, stage 4: Secondary | ICD-10-CM | POA: Diagnosis present

## 2024-05-02 DIAGNOSIS — R001 Bradycardia, unspecified: Secondary | ICD-10-CM | POA: Diagnosis not present

## 2024-05-02 DIAGNOSIS — R6521 Severe sepsis with septic shock: Secondary | ICD-10-CM | POA: Diagnosis present

## 2024-05-02 DIAGNOSIS — F209 Schizophrenia, unspecified: Secondary | ICD-10-CM | POA: Diagnosis present

## 2024-05-02 DIAGNOSIS — E162 Hypoglycemia, unspecified: Secondary | ICD-10-CM | POA: Diagnosis not present

## 2024-05-02 DIAGNOSIS — I442 Atrioventricular block, complete: Secondary | ICD-10-CM

## 2024-05-02 DIAGNOSIS — Z7901 Long term (current) use of anticoagulants: Secondary | ICD-10-CM

## 2024-05-02 DIAGNOSIS — R34 Anuria and oliguria: Secondary | ICD-10-CM | POA: Diagnosis not present

## 2024-05-02 DIAGNOSIS — E11621 Type 2 diabetes mellitus with foot ulcer: Secondary | ICD-10-CM | POA: Diagnosis present

## 2024-05-02 DIAGNOSIS — I5022 Chronic systolic (congestive) heart failure: Secondary | ICD-10-CM | POA: Diagnosis present

## 2024-05-02 DIAGNOSIS — Z886 Allergy status to analgesic agent status: Secondary | ICD-10-CM

## 2024-05-02 DIAGNOSIS — Z789 Other specified health status: Secondary | ICD-10-CM | POA: Diagnosis not present

## 2024-05-02 DIAGNOSIS — Z711 Person with feared health complaint in whom no diagnosis is made: Secondary | ICD-10-CM | POA: Diagnosis not present

## 2024-05-02 DIAGNOSIS — Z560 Unemployment, unspecified: Secondary | ICD-10-CM

## 2024-05-02 DIAGNOSIS — I2510 Atherosclerotic heart disease of native coronary artery without angina pectoris: Secondary | ICD-10-CM | POA: Diagnosis not present

## 2024-05-02 DIAGNOSIS — F17210 Nicotine dependence, cigarettes, uncomplicated: Secondary | ICD-10-CM | POA: Diagnosis present

## 2024-05-02 DIAGNOSIS — E1165 Type 2 diabetes mellitus with hyperglycemia: Secondary | ICD-10-CM | POA: Diagnosis not present

## 2024-05-02 DIAGNOSIS — Z7960 Long term (current) use of unspecified immunomodulators and immunosuppressants: Secondary | ICD-10-CM

## 2024-05-02 DIAGNOSIS — E1122 Type 2 diabetes mellitus with diabetic chronic kidney disease: Secondary | ICD-10-CM | POA: Diagnosis present

## 2024-05-02 DIAGNOSIS — A412 Sepsis due to unspecified staphylococcus: Secondary | ICD-10-CM | POA: Diagnosis present

## 2024-05-02 DIAGNOSIS — N1831 Chronic kidney disease, stage 3a: Secondary | ICD-10-CM | POA: Diagnosis not present

## 2024-05-02 DIAGNOSIS — Z79899 Other long term (current) drug therapy: Secondary | ICD-10-CM

## 2024-05-02 DIAGNOSIS — R680 Hypothermia, not associated with low environmental temperature: Secondary | ICD-10-CM | POA: Diagnosis present

## 2024-05-02 DIAGNOSIS — Z7189 Other specified counseling: Secondary | ICD-10-CM | POA: Diagnosis not present

## 2024-05-02 DIAGNOSIS — F03911 Unspecified dementia, unspecified severity, with agitation: Secondary | ICD-10-CM | POA: Diagnosis present

## 2024-05-02 DIAGNOSIS — I739 Peripheral vascular disease, unspecified: Secondary | ICD-10-CM | POA: Diagnosis not present

## 2024-05-02 DIAGNOSIS — I10 Essential (primary) hypertension: Secondary | ICD-10-CM

## 2024-05-02 DIAGNOSIS — E11649 Type 2 diabetes mellitus with hypoglycemia without coma: Secondary | ICD-10-CM | POA: Diagnosis not present

## 2024-05-02 DIAGNOSIS — N401 Enlarged prostate with lower urinary tract symptoms: Secondary | ICD-10-CM | POA: Diagnosis present

## 2024-05-02 DIAGNOSIS — D75839 Thrombocytosis, unspecified: Secondary | ICD-10-CM | POA: Diagnosis present

## 2024-05-02 DIAGNOSIS — Z91018 Allergy to other foods: Secondary | ICD-10-CM

## 2024-05-02 DIAGNOSIS — Z5982 Transportation insecurity: Secondary | ICD-10-CM

## 2024-05-02 DIAGNOSIS — B379 Candidiasis, unspecified: Secondary | ICD-10-CM | POA: Diagnosis present

## 2024-05-02 DIAGNOSIS — R338 Other retention of urine: Secondary | ICD-10-CM | POA: Diagnosis present

## 2024-05-02 DIAGNOSIS — F419 Anxiety disorder, unspecified: Secondary | ICD-10-CM | POA: Diagnosis present

## 2024-05-02 DIAGNOSIS — N183 Chronic kidney disease, stage 3 unspecified: Secondary | ICD-10-CM | POA: Diagnosis not present

## 2024-05-02 HISTORY — DX: Cerebral infarction, unspecified: I63.9

## 2024-05-02 HISTORY — DX: Other sequelae of cerebral infarction: I69.398

## 2024-05-02 LAB — PROTIME-INR
INR: 1.3 — ABNORMAL HIGH (ref 0.8–1.2)
Prothrombin Time: 17 s — ABNORMAL HIGH (ref 11.4–15.2)

## 2024-05-02 LAB — TSH: TSH: 5.717 u[IU]/mL — ABNORMAL HIGH (ref 0.350–4.500)

## 2024-05-02 LAB — CBC WITH DIFFERENTIAL/PLATELET
Abs Immature Granulocytes: 0.06 K/uL (ref 0.00–0.07)
Basophils Absolute: 0 K/uL (ref 0.0–0.1)
Basophils Relative: 0 %
Eosinophils Absolute: 0.1 K/uL (ref 0.0–0.5)
Eosinophils Relative: 1 %
HCT: 26.3 % — ABNORMAL LOW (ref 39.0–52.0)
Hemoglobin: 8 g/dL — ABNORMAL LOW (ref 13.0–17.0)
Immature Granulocytes: 1 %
Lymphocytes Relative: 11 %
Lymphs Abs: 0.7 K/uL (ref 0.7–4.0)
MCH: 26.4 pg (ref 26.0–34.0)
MCHC: 30.4 g/dL (ref 30.0–36.0)
MCV: 86.8 fL (ref 80.0–100.0)
Monocytes Absolute: 0.2 K/uL (ref 0.1–1.0)
Monocytes Relative: 4 %
Neutro Abs: 5 K/uL (ref 1.7–7.7)
Neutrophils Relative %: 83 %
Platelets: 634 K/uL — ABNORMAL HIGH (ref 150–400)
RBC: 3.03 MIL/uL — ABNORMAL LOW (ref 4.22–5.81)
RDW: 14.5 % (ref 11.5–15.5)
WBC: 6 K/uL (ref 4.0–10.5)
nRBC: 0 % (ref 0.0–0.2)

## 2024-05-02 LAB — COMPREHENSIVE METABOLIC PANEL WITH GFR
ALT: 7 U/L (ref 0–44)
AST: 12 U/L — ABNORMAL LOW (ref 15–41)
Albumin: 2 g/dL — ABNORMAL LOW (ref 3.5–5.0)
Alkaline Phosphatase: 52 U/L (ref 38–126)
Anion gap: 12 (ref 5–15)
BUN: 42 mg/dL — ABNORMAL HIGH (ref 8–23)
CO2: 19 mmol/L — ABNORMAL LOW (ref 22–32)
Calcium: 9.3 mg/dL (ref 8.9–10.3)
Chloride: 113 mmol/L — ABNORMAL HIGH (ref 98–111)
Creatinine, Ser: 2.32 mg/dL — ABNORMAL HIGH (ref 0.61–1.24)
GFR, Estimated: 31 mL/min — ABNORMAL LOW (ref >60)
Glucose, Bld: 82 mg/dL (ref 70–99)
Potassium: 4.7 mmol/L (ref 3.5–5.1)
Sodium: 144 mmol/L (ref 135–145)
Total Bilirubin: 0.5 mg/dL (ref 0.0–1.2)
Total Protein: 6.8 g/dL (ref 6.5–8.1)

## 2024-05-02 LAB — GLUCOSE, CAPILLARY
Glucose-Capillary: 108 mg/dL — ABNORMAL HIGH (ref 70–99)
Glucose-Capillary: 63 mg/dL — ABNORMAL LOW (ref 70–99)
Glucose-Capillary: 71 mg/dL (ref 70–99)
Glucose-Capillary: 76 mg/dL (ref 70–99)
Glucose-Capillary: 87 mg/dL (ref 70–99)

## 2024-05-02 LAB — T4, FREE: Free T4: 1.05 ng/dL (ref 0.61–1.12)

## 2024-05-02 LAB — URINALYSIS, W/ REFLEX TO CULTURE (INFECTION SUSPECTED)
Bacteria, UA: NONE SEEN
Bilirubin Urine: NEGATIVE
Glucose, UA: NEGATIVE mg/dL
Hgb urine dipstick: NEGATIVE
Ketones, ur: NEGATIVE mg/dL
Nitrite: NEGATIVE
Protein, ur: NEGATIVE mg/dL
Specific Gravity, Urine: 1.014 (ref 1.005–1.030)
pH: 5 (ref 5.0–8.0)

## 2024-05-02 LAB — CBC
HCT: 25.5 % — ABNORMAL LOW (ref 39.0–52.0)
Hemoglobin: 7.9 g/dL — ABNORMAL LOW (ref 13.0–17.0)
MCH: 26.8 pg (ref 26.0–34.0)
MCHC: 31 g/dL (ref 30.0–36.0)
MCV: 86.4 fL (ref 80.0–100.0)
Platelets: 638 K/uL — ABNORMAL HIGH (ref 150–400)
RBC: 2.95 MIL/uL — ABNORMAL LOW (ref 4.22–5.81)
RDW: 14.5 % (ref 11.5–15.5)
WBC: 7.8 K/uL (ref 4.0–10.5)
nRBC: 0 % (ref 0.0–0.2)

## 2024-05-02 LAB — I-STAT CG4 LACTIC ACID, ED: Lactic Acid, Venous: 1.3 mmol/L (ref 0.5–1.9)

## 2024-05-02 LAB — MRSA NEXT GEN BY PCR, NASAL: MRSA by PCR Next Gen: NOT DETECTED

## 2024-05-02 LAB — CREATININE, SERUM
Creatinine, Ser: 2.45 mg/dL — ABNORMAL HIGH (ref 0.61–1.24)
GFR, Estimated: 29 mL/min — ABNORMAL LOW (ref >60)

## 2024-05-02 LAB — CBG MONITORING, ED: Glucose-Capillary: 77 mg/dL (ref 70–99)

## 2024-05-02 LAB — TROPONIN I (HIGH SENSITIVITY)
Troponin I (High Sensitivity): 14 ng/L (ref <18)
Troponin I (High Sensitivity): 16 ng/L (ref <18)

## 2024-05-02 LAB — HIV ANTIBODY (ROUTINE TESTING W REFLEX): HIV Screen 4th Generation wRfx: NONREACTIVE

## 2024-05-02 MED ORDER — MEDIHONEY WOUND/BURN DRESSING EX PSTE
1.0000 | PASTE | Freq: Every day | CUTANEOUS | Status: DC
  Administered 2024-05-03 – 2024-05-08 (×7): 1 via TOPICAL
  Filled 2024-05-02: qty 44

## 2024-05-02 MED ORDER — CHLORHEXIDINE GLUCONATE CLOTH 2 % EX PADS
6.0000 | MEDICATED_PAD | Freq: Every day | CUTANEOUS | Status: DC
  Administered 2024-05-02 – 2024-05-08 (×7): 6 via TOPICAL

## 2024-05-02 MED ORDER — ATROPINE SULFATE 0.4 MG/ML IV SOLN: 0.4000 mg | Freq: Once | INTRAVENOUS | Status: DC

## 2024-05-02 MED ORDER — SODIUM CHLORIDE 0.9 % IV SOLN
1.0000 g | INTRAVENOUS | Status: DC
  Administered 2024-05-02 – 2024-05-04 (×3): 1 g via INTRAVENOUS
  Filled 2024-05-02 (×4): qty 1000

## 2024-05-02 MED ORDER — DOCUSATE SODIUM 100 MG PO CAPS
100.0000 mg | ORAL_CAPSULE | Freq: Two times a day (BID) | ORAL | Status: DC | PRN
  Administered 2024-05-05: 100 mg via ORAL
  Filled 2024-05-02: qty 1

## 2024-05-02 MED ORDER — ATROPINE SULFATE 1 MG/10ML IJ SOSY
0.5000 mg | PREFILLED_SYRINGE | Freq: Once | INTRAMUSCULAR | Status: AC
  Administered 2024-05-02: 0.5 mg via INTRAVENOUS

## 2024-05-02 MED ORDER — POLYETHYLENE GLYCOL 3350 17 G PO PACK
17.0000 g | PACK | Freq: Every day | ORAL | Status: DC | PRN
  Administered 2024-05-05: 17 g via ORAL
  Filled 2024-05-02: qty 1

## 2024-05-02 MED ORDER — HEPARIN SODIUM (PORCINE) 5000 UNIT/ML IJ SOLN
5000.0000 [IU] | Freq: Three times a day (TID) | INTRAMUSCULAR | Status: DC
  Administered 2024-05-02 – 2024-05-03 (×2): 5000 [IU] via SUBCUTANEOUS
  Filled 2024-05-02 (×2): qty 1

## 2024-05-02 MED ORDER — VANCOMYCIN HCL 1500 MG/300ML IV SOLN
1500.0000 mg | Freq: Once | INTRAVENOUS | Status: AC
  Administered 2024-05-02: 1500 mg via INTRAVENOUS
  Filled 2024-05-02: qty 300

## 2024-05-02 MED ORDER — ORAL CARE MOUTH RINSE
15.0000 mL | OROMUCOSAL | Status: DC
  Administered 2024-05-03 – 2024-05-09 (×25): 15 mL via OROMUCOSAL

## 2024-05-02 MED ORDER — FENTANYL CITRATE PF 50 MCG/ML IJ SOSY
50.0000 ug | PREFILLED_SYRINGE | Freq: Once | INTRAMUSCULAR | Status: AC
  Administered 2024-05-02: 50 ug via INTRAVENOUS

## 2024-05-02 MED ORDER — VANCOMYCIN HCL 1500 MG/300ML IV SOLN
1500.0000 mg | INTRAVENOUS | Status: AC
  Administered 2024-05-04 – 2024-05-08 (×3): 1500 mg via INTRAVENOUS
  Filled 2024-05-02 (×3): qty 300

## 2024-05-02 MED ORDER — DEXTROSE 5 % IV SOLN: INTRAVENOUS | Status: DC

## 2024-05-02 MED ORDER — DEXTROSE 50 % IV SOLN
12.5000 g | INTRAVENOUS | Status: AC
  Administered 2024-05-02: 12.5 g via INTRAVENOUS

## 2024-05-02 MED ORDER — DEXTROSE 50 % IV SOLN
INTRAVENOUS | Status: AC
  Filled 2024-05-02: qty 50

## 2024-05-02 MED ORDER — ORAL CARE MOUTH RINSE: 15.0000 mL | OROMUCOSAL | Status: DC | PRN

## 2024-05-02 MED ORDER — FENTANYL CITRATE PF 50 MCG/ML IJ SOSY
PREFILLED_SYRINGE | INTRAMUSCULAR | Status: AC
  Filled 2024-05-02: qty 1

## 2024-05-02 MED ORDER — INSULIN ASPART 100 UNIT/ML IJ SOLN: 0.0000 [IU] | INTRAMUSCULAR | Status: DC

## 2024-05-02 MED ORDER — COLLAGENASE 250 UNIT/GM EX OINT
TOPICAL_OINTMENT | Freq: Every day | CUTANEOUS | Status: DC
  Filled 2024-05-02: qty 30

## 2024-05-02 MED ORDER — LACTATED RINGERS IV BOLUS (SEPSIS)
1000.0000 mL | Freq: Once | INTRAVENOUS | Status: AC
  Administered 2024-05-02: 1000 mL via INTRAVENOUS

## 2024-05-02 NOTE — Progress Notes (Signed)
 Hypoglycemic Event  CBG: 63   Treatment: D50 25 mL (12.5 gm)  Symptoms: lethargy   Follow-up CBG: Time:1617 CBG Result:108  Possible Reasons for Event: Inadequate meal intake  Comments/MD notified:NP Whitney Arloa Adolphus JAYSON Geryl, RN

## 2024-05-02 NOTE — Progress Notes (Signed)
 Patient has severe apnea in sleep, HR dropping to 20s-30s. Transcutaneous pacing at 50bpm backup. NP Benton Lesches and MD [facility] notified and aware, orders for bipap.

## 2024-05-02 NOTE — Progress Notes (Signed)
 eLink Physician-Brief Progress Note Patient Name: Theodore Arellano DOB: 02/12/1961 MRN: [id number]   Date of Service  05/02/2024  HPI/Events of Note  hypoglycemic event (63) earlier but does not have CBGs scheduled  eICU Interventions  SSI with hypoglycemia protocol     Intervention Category Intermediate Interventions: Hyperglycemia - evaluation and treatment  Trinnity Breunig 05/02/2024, 8:41 PM

## 2024-05-02 NOTE — ED Notes (Signed)
 Patient Cardiac Monitor showed bradycardia into asystole and returned to bradycardia. MD Trifan notified.  Zoll pads placed for pacing at 50 BPM. 0.5 mg Atropine  administered.

## 2024-05-02 NOTE — Plan of Care (Signed)

## 2024-05-02 NOTE — H&P (Signed)
 NAME:  Theodore Arellano, MRN:  [id number], DOB:  [birth year], LOS: 0 ADMISSION DATE:  05/02/2024, CONSULTATION DATE:  05/02/5024 REFERRING MD:  Dr. Cottie - EDP , CHIEF COMPLAINT:  Septic shock with CHB on arrival to ED    History of Present Illness:  Theodore Arellano is a 63 y.o. male with a PMH significant for CAD, HTN, HLD, renal transplant, advanced CAD PAD, anxiety, who presented to the ED at [facility] from local SNF [facility] for altered mental status.  Per reports from SNF patient typically is alert and oriented x 3 with GCS 14 SNF reports GCS had dropped to 10 with confusion prompting EMS call.  Of note this is in the setting of treatment for sacral wound with ertapenem  at SNF.  On arrival to ED patient was seen bradycardic and mildly hypertensive.  During workup in emergency department patient became more progressively bradycardic with EKG concerning for complete heart block.  At that time patient was placed on external pacing.  Blood work significant for creatinine 2.32 with GFR 31, BUN 42, AST 12, hemoglobin 8, lactic within normal limit of 1.3, INR 1.3.  Given concern for septic shock with complete heart block and critical care consult for further management admission.  Pertinent  Medical History  CAD, HTN, HLD, PAD, anxiety,   Significant Hospital Events: Including procedures, antibiotic start and stop dates in addition to other pertinent events   8/2 presented with altered mental status and workup concerning for sepsis with new onset complete heart block  Interim History / Subjective:  Awakes to verbal stimuli  Objective    Blood pressure (!) 149/94, pulse (!) 52, resp. rate 19, SpO2 100%.       No intake or output data in the 24 hours ending 05/02/24 1249 There were no vitals filed for this visit.  Examination: General: Acute on chronic ill-appearing deconditioned middle-age male lying in bed on ED stretcher acutely ill  HEENT: [place]/AT, MM pink/moist, PERRL,  Neuro: Opens eyes  to verbal stimuli, unable to follow commands CV: s1s2 regular rate and rhythm, no murmur, rubs, or gallops,  PULM: Shallow respirations bilaterally, brief periods of apnea, no increased work of breathing, on room air GI: soft, bowel sounds active in all 4 quadrants, non-tender, non-distended, tolerating TF Extremities: warm/dry, no edema  Skin: Reported sacral wound unable to observe wound in ED  Resolved problem list   Assessment and Plan  Concern for evolving sepsis - Patient presented with altered mental status in the setting of known sacral wound being treated with ertapenem  at SNF.  On admission to ED he was bradycardic with no documented temperature for admission thus far P: Admit ICU Supplemental oxygen  as needed for sat goal greater than 92% Pan cultures prior to antibiotic Continue IV ertapenem  and add vancomycin  Aggressive IV hydration provided on admission Trend lactic acid Monitor urine output CT chest abdomen and pelvis to better evaluate sources of infection including potential sacral osteomyelitis  Acute encephalopathy -Etiology unknown potentially metabolic versus septic, lab work pending at time of my evaluation P: Maintain neuro protective measures; goal for eurothermia, euglycemia, eunatermia, normoxia, and PCO2 goal of 35-40 Nutrition and bowel regiment  Seizure precautions  Aspirations precautions   Intermittent complete heart block History of severe CAD not amenable to intervention Essential hypertension Hyperlipidemia P: Cardiology consulted, appreciate assistance Continuous telemetry Maintain external pacing pads  Optimize electrolytes Hold home antihypertensives  Acute kidney injury with history of renal transplant - Creatinine 2.30 with GFR  31 on admission compared to creatinine 1.94 with GFR 38 February 2025 P: Follow renal function  Monitor urine output Trend Bmet Avoid nephrotoxins Ensure adequate renal perfusion  IV hydration provided on  ED admission Medication reconciliation pending, resume immunosuppressants when/if appropriate  Anemia P: Trend CBC Transfuse per protocol Hemoglobin goal greater than 7   Best Practice (right click and Reselect all SmartList Selections daily)   Diet/type: NPO DVT prophylaxis prophylactic heparin   Pressure ulcer(s): present on admission  GI prophylaxis: PPI Lines: N/A Foley:  N/A Code Status:  full code Last date of multidisciplinary goals of care discussion pending  Labs   CBC: No results for input(s): WBC, NEUTROABS, HGB, HCT, MCV, PLT in the last 168 hours.  Basic Metabolic Panel: No results for input(s): NA, K, CL, CO2, GLUCOSE, BUN, CREATININE, CALCIUM , MG, PHOS in the last 168 hours. GFR: CrCl cannot be calculated (Patient's most recent lab result is older than the maximum 21 days allowed.). No results for input(s): PROCALCITON, WBC, LATICACIDVEN in the last 168 hours.  Liver Function Tests: No results for input(s): AST, ALT, ALKPHOS, BILITOT, PROT, ALBUMIN  in the last 168 hours. No results for input(s): LIPASE, AMYLASE in the last 168 hours. No results for input(s): AMMONIA in the last 168 hours.  ABG    Component Value Date/Time   PHART 7.296 (L) 07/16/2023 2027   PCO2ART 41.6 07/16/2023 2027   PO2ART 261 (H) 07/16/2023 2027   HCO3 20.4 07/16/2023 2027   TCO2 22 07/16/2023 2027   ACIDBASEDEF 6.0 (H) 07/16/2023 2027   O2SAT 100 07/16/2023 2027     Coagulation Profile: No results for input(s): INR, PROTIME in the last 168 hours.  Cardiac Enzymes: No results for input(s): CKTOTAL, CKMB, CKMBINDEX, TROPONINI in the last 168 hours.  HbA1C: Hgb A1c MFr Bld  Date/Time Value Ref Range Status  07/16/2023 04:23 AM 6.4 (H) 4.8 - 5.6 % Final    Comment:    (NOTE) Pre diabetes:          5.7%-6.4%  Diabetes:              >6.4%  Glycemic control for   <7.0% adults with diabetes    11/08/2022 01:36 AM 6.0 (H) 4.8 - 5.6 % Final    Comment:    (NOTE) Pre diabetes:          5.7%-6.4%  Diabetes:              >6.4%  Glycemic control for   <7.0% adults with diabetes     CBG: Recent Labs  Lab 05/02/24 1148  GLUCAP 77    Review of Systems:   Unable to assess   Past Medical History:  He,  has a past medical history of Anxiety, Arthritis, CAD (coronary artery disease), Chronic lower back pain, H/O immunosuppressive therapy, History of gout, Hyperlipidemia, Hypertension, Kidney disease, Peripheral arterial disease (HCC), and Tobacco use.   Surgical History:   Past Surgical History:  Procedure Laterality Date   ABDOMINAL AORTOGRAM W/LOWER EXTREMITY Bilateral 12/29/2020   Procedure: ABDOMINAL AORTOGRAM W/LOWER EXTREMITY;  Surgeon: Gretta Lonni PARAS, MD;  Location: [facility] INVASIVE CV LAB;  Service: Cardiovascular;  Laterality: Bilateral;   ARTERIOVENOUS GRAFT PLACEMENT Right 04/2007   /notes 02/01/2011   AV FISTULA PLACEMENT Left 12/20/2005; 12/2006   thelbert 02/13/2011; thelbert 02/13/2011   AXILLARY-FEMORAL BYPASS GRAFT Right 01/03/2021   Procedure: RIGHT AXILLARY TO PROFUNDA FEMORAL BYPASS;  Surgeon: Oris Krystal FALCON, MD;  Location: [facility] OR;  Service: Vascular;  Laterality:  Right;   AXILLARY-FEMORAL BYPASS GRAFT Right 07/16/2023   Procedure: REDO RIGHT SARAHANN MOMENT WITH PATCH ANGIOPLSTY RIGHT POPLITEAL ARTERY.;  Surgeon: Sheree Penne Bruckner, MD;  Location: [facility] OR;  Service: Vascular;  Laterality: Right;   CARDIAC CATHETERIZATION     EMBOLECTOMY  07/16/2023   Procedure: EMBOLECTOMY RIGHT Axilliofemoral and femoral/popliteal bypass graft.;  Surgeon: Sheree Penne Bruckner, MD;  Location: [facility] OR;  Service: Vascular;;   FEMORAL-POPLITEAL BYPASS GRAFT Right 01/03/2021   Procedure: RIGHT FEMORAL TO BELOW KNEE POPLITEAL ARTERY BYPASS;  Surgeon: Oris Krystal FALCON, MD;  Location: [facility] OR;  Service: Vascular;  Laterality: Right;   FEMORAL-POPLITEAL BYPASS GRAFT  Right 07/19/2023   Procedure: REDO RIGHT FEMORAL-POPLITEAL ARTERY BYPASS GRAFT;  Surgeon: Sheree Penne Bruckner, MD;  Location: [facility] OR;  Service: Vascular;  Laterality: Right;   HD access procedures     INGUINAL HERNIA REPAIR Left    KIDNEY TRANSPLANT  2011   LEFT HEART CATH AND CORONARY ANGIOGRAPHY N/A 11/29/2017   Procedure: LEFT HEART CATH AND CORONARY ANGIOGRAPHY;  Surgeon: Anner Alm ORN, MD;  Location: [facility] INVASIVE CV LAB;  Service: Cardiovascular;  Laterality: N/A;   LEFT HEART CATH AND CORONARY ANGIOGRAPHY N/A 06/01/2019   Procedure: LEFT HEART CATH AND CORONARY ANGIOGRAPHY;  Surgeon: Verlin Bruckner BIRCH, MD;  Location: [facility] INVASIVE CV LAB;  Service: Cardiovascular;  Laterality: N/A;   LEFT HEART CATHETERIZATION WITH CORONARY ANGIOGRAM  03/11/2012   Procedure: LEFT HEART CATHETERIZATION WITH CORONARY ANGIOGRAM;  Surgeon: Toribio JONELLE Fuel, MD;  Location: [facility] CATH LAB;  Service: Cardiovascular;;   LOWER EXTREMITY ANGIOGRAM Right 07/19/2023   Procedure: LOWER EXTREMITY ANGIOGRAM;  Surgeon: Sheree Penne Bruckner, MD;  Location: [facility] OR;  Service: Vascular;  Laterality: Right;   PATCH ANGIOPLASTY Right 07/19/2023   Procedure: PATCH ANGIOPLASTY USING GEORGE BIOLOGIC PATCH;  Surgeon: Sheree Penne Bruckner, MD;  Location: [facility] OR;  Service: Vascular;  Laterality: Right;   RIGHT HEART CATHETERIZATION N/A 03/11/2012   Procedure: RIGHT HEART CATH;  Surgeon: Toribio JONELLE Fuel, MD;  Location: [facility] CATH LAB;  Service: Cardiovascular;  Laterality: N/A;   THROMBECTOMY Right 12/2007   thelbert 02/01/2011   THROMBECTOMY / ARTERIOVENOUS GRAFT REVISION Left 12/2006   thelbert 02/13/2011   THROMBECTOMY / ARTERIOVENOUS GRAFT REVISION Right 07/2007; 10/2007;01/2008;   thelbert 01/31/2011; thelbert 02/01/2011; thelbert 01/31/2011   THROMBECTOMY AND REVISION OF ARTERIOVENTOUS (AV) GORETEX  GRAFT  03/2007 X 2   thelbert 02/01/2011   THROMBECTOMY OF BYPASS GRAFT FEMORAL- POPLITEAL ARTERY Right 07/19/2023   Procedure:  THROMBECTOMY OF BYPASS GRAFT FEMORAL-POPLITEAL ARTERY;  Surgeon: Sheree Penne Bruckner, MD;  Location: [facility] OR;  Service: Vascular;  Laterality: Right;     Social History:   reports that he has been smoking cigarettes. He has a 20 pack-year smoking history. He has never used smokeless tobacco. He reports current alcohol use of about 12.0 standard drinks of alcohol per week. He reports that he does not use drugs.   Family History:  His family history includes Diabetes in his mother; Hypertension in his father.   Allergies Allergies  Allergen Reactions   Percocet [Oxycodone -Acetaminophen ] Hives and Itching   Vicodin [Hydrocodone -Acetaminophen ] Hives and Itching   Shellfish Allergy Hives   Chocolate Flavoring Agent (Non-Screening) Hives   Chocolate Hives   Tomato Hives     Home Medications  Prior to Admission medications   Medication Sig Start Date End Date Taking? Authorizing Provider  acetaminophen  (TYLENOL ) 500 MG tablet Take 500 mg by mouth every 6 (six) hours as needed for moderate  pain or headache.    [provider]  aspirin  EC 81 MG tablet Take 81 mg by mouth daily.    [provider]  blood glucose meter kit and supplies KIT Dispense based on patient and insurance preference. Use up to four times daily as directed. (FOR ICD-9 250.00, 250.01). 11/11/19   Samtani, Jai-Gurmukh, MD  brimonidine  (ALPHAGAN ) 0.2 % ophthalmic solution Place 1 drop into the right eye 3 (three) times daily. Patient not taking: Reported on 07/15/2023 11/09/22   Christia Budds, MD  colchicine  0.6 MG tablet Take 1 tablet (0.6 mg total) by mouth daily as needed. Patient taking differently: Take 0.6 mg by mouth daily as needed (for gout flares). 01/06/21   Delores Suzann HERO, MD  furosemide  (LASIX ) 40 MG tablet Take 40 mg by mouth daily. 06/25/23   [provider]  HYDROcodone -acetaminophen  (NORCO/VICODIN) 5-325 MG tablet Take 1 tablet by mouth every 6 (six) hours as needed for severe pain  (pain score 7-10). 11/21/23   Barrett, Jamie N, PA-C  hydrOXYzine  (ATARAX ) 25 MG tablet Take 25-50 mg by mouth every 6 (six) hours as needed for itching (or sleep).    [provider]  losartan  (COZAAR ) 50 MG tablet Take 2 tablets (100 mg total) by mouth every evening. 07/23/23   Lue Elsie BROCKS, MD  mycophenolate  (MYFORTIC ) 360 MG TBEC EC tablet Take 720 mg by mouth 2 (two) times daily.    [provider]  NARCAN  4 MG/0.1ML LIQD nasal spray kit Place 1 spray into the nose as needed (overdose). 12/02/19   [provider]  pantoprazole  (PROTONIX ) 40 MG tablet Take 40 mg by mouth in the morning. 11/02/19   [provider]  predniSONE  (DELTASONE ) 20 MG tablet Take 2 tablets (40 mg total) by mouth daily. 11/21/23   Barrett, Warren SAILOR, PA-C  pregabalin  (LYRICA ) 75 MG capsule Take 75 mg by mouth 2 (two) times daily. 07/04/23   [provider]  rivaroxaban  (XARELTO ) 2.5 MG TABS tablet Take 1 tablet (2.5 mg total) by mouth 2 (two) times daily. Patient taking differently: Take 5 mg by mouth every morning. 11/09/22   Christia Budds, MD  rosuvastatin  (CRESTOR ) 5 MG tablet Take 5 mg by mouth daily.    [provider]  sodium bicarbonate  650 MG tablet Take 1,300 mg by mouth 2 (two) times daily.    [provider]  tacrolimus  (PROGRAF ) 1 MG capsule Take 2-3 mg by mouth See admin instructions. Take 3 mg by mouth in the morning and 2 mg at bedtime    [provider]  tamsulosin  (FLOMAX ) 0.4 MG CAPS capsule Take 0.4 mg by mouth at bedtime. 08/28/19   [provider]     Critical care time:   CRITICAL CARE Performed by: Chalee Hirota D. Harris   Total critical care time: 42 minutes  Critical care time was exclusive of separately billable procedures and treating other patients.  Critical care was necessary to treat or prevent imminent or life-threatening deterioration.  Critical care was time spent personally by me on the following  activities: development of treatment plan with patient and/or surrogate as well as nursing, discussions with consultants, evaluation of patient's response to treatment, examination of patient, obtaining history from patient or surrogate, ordering and performing treatments and interventions, ordering and review of laboratory studies, ordering and review of radiographic studies, pulse oximetry and re-evaluation of patient's condition.  Lita Flynn D. [place]loa, NP-C [facility] Personal contact information can be found on Amion  If no contact or response made please call [phone] 05/02/2024, 2:41 PM

## 2024-05-02 NOTE — ED Provider Notes (Signed)
 [place] EMERGENCY DEPARTMENT AT [facility] Provider Note   CSN: [id number] Arrival date & time: 05/02/24  1133     Patient presents with: Altered Mental Status   Theodore Arellano is a 63 y.o. male here from SNF with AMS.  Reportedly had GCS 10.  Patient is a poor historian.  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   HPI     Prior to Admission medications   Medication Sig Start Date End Date Taking? Authorizing Provider  acetaminophen  (TYLENOL ) 500 MG tablet Take 500 mg by mouth every 6 (six) hours as needed for moderate pain or headache.   Yes [provider]  allopurinol (ZYLOPRIM) 100 MG tablet Take 100 mg by mouth daily.   Yes [provider]  amLODipine  (NORVASC ) 10 MG tablet Take 10 mg by mouth daily.   Yes [provider]  aspirin  EC 81 MG tablet Take 81 mg by mouth daily.   Yes [provider]  ertapenem  (INVANZ ) 1 g injection Inject 1 g into the muscle at bedtime. 04/19/24 05/03/24 Yes [provider]  furosemide  (LASIX ) 40 MG tablet Take 40 mg by mouth daily. 06/25/23  Yes [provider]  gabapentin  (NEURONTIN ) 100 MG capsule Take 300 mg by mouth 3 (three) times daily.   Yes [provider]  lactulose (CHRONULAC) 10 GM/15ML solution Take 20 g by mouth 3 (three) times daily.   Yes [provider]  losartan  (COZAAR ) 100 MG tablet Take 100 mg by mouth daily.   Yes [provider]  magnesium  oxide (MAG-OX) 400 (240 Mg) MG tablet Take 400 mg by mouth 2 (two) times daily.   Yes [provider]  mycophenolate  (MYFORTIC )  360 MG TBEC EC tablet Take 720 mg by mouth 2 (two) times daily.   Yes [provider]  nystatin (MYCOSTATIN) 100000 UNIT/ML suspension Take 5 mLs by mouth 4 (four) times daily. For thrush 04/30/24 05/05/24 Yes [provider]  pantoprazole  (PROTONIX ) 40 MG tablet Take 40 mg by mouth daily. 11/02/19  Yes [provider]  predniSONE  (DELTASONE ) 5 MG tablet Take 5 mg by mouth daily.   Yes [provider]  rivaroxaban  (XARELTO ) 2.5 MG TABS tablet Take 1 tablet (2.5 mg total) by mouth 2 (two) times daily. 11/09/22  Yes Jagadish, Mayuri, MD  senna (SENOKOT) 8.6 MG tablet Take 17.2 mg by mouth 2 (two) times daily.   Yes [provider]  sodium chloride  0.9 % infusion Inject 400 mLs into the vein at bedtime. 11mL/hr for 4 hours following antibiotic administration. 04/28/24 05/04/24 Yes [provider]  tacrolimus  (PROGRAF ) 1 MG capsule Take 2-3 mg by mouth See admin instructions. Give 3 capsules (3mg ) by mouth in the morning and give 2 capsules (2mg ) in the afternoon.   Yes [provider]  tamsulosin  (FLOMAX ) 0.4 MG CAPS capsule Take 0.4 mg by mouth daily. 08/28/19  Yes [provider]  triamcinolone cream (KENALOG) 0.1 % Apply 1 Application topically daily. To skin tear   Yes [provider]    Allergies: Percocet [oxycodone -acetaminophen ], Vicodin [hydrocodone -acetaminophen ], Shellfish allergy, Chocolate flavoring agent (non-screening),  Chocolate, and Tomato    Review of Systems  Updated Vital Signs BP 104/68   Pulse 62   Temp (!) 92.3 F (33.5 C) (Rectal)   Resp 14   SpO2 100%   Physical Exam Constitutional:      General: He is not in acute distress. HENT:     Head: Normocephalic and atraumatic.  Eyes:     Conjunctiva/sclera: Conjunctivae normal.     Pupils: Pupils are equal, round, and reactive to light.  Cardiovascular:     Rate and Rhythm: Regular rhythm. Bradycardia present.  Pulmonary:     Effort: Pulmonary  effort is normal. No respiratory distress.  Abdominal:     General: There is no distension.     Tenderness: There is no abdominal tenderness.  Genitourinary:    Comments: Indwelling foley catheter Skin:    General: Skin is warm and dry.     Comments: Stage IV decubitus sacral ulceration with mild purulence noted Heel ulcerations noted  Neurological:     Mental Status: He is alert.     Comments: GCS 13     (all labs ordered are listed, but only abnormal results are displayed) Labs Reviewed  COMPREHENSIVE METABOLIC PANEL WITH GFR - Abnormal; Notable for the following components:      Result Value   Chloride 113 (*)    CO2 19 (*)    BUN 42 (*)    Creatinine, Ser 2.32 (*)    Albumin  2.0 (*)    AST 12 (*)    GFR, Estimated 31 (*)    All other components within normal limits  CBC WITH DIFFERENTIAL/PLATELET - Abnormal; Notable for the following components:   RBC 3.03 (*)    Hemoglobin 8.0 (*)    HCT 26.3 (*)    Platelets 634 (*)    All other components within normal limits  PROTIME-INR - Abnormal; Notable for the following components:   Prothrombin Time 17.0 (*)    INR 1.3 (*)    All other components within normal limits  URINALYSIS, W/ REFLEX TO CULTURE (INFECTION SUSPECTED) - Abnormal; Notable for the following components:   APPearance HAZY (*)    Leukocytes,Ua SMALL (*)    All other components within normal limits  CULTURE, BLOOD (ROUTINE X 2)  CULTURE, BLOOD (ROUTINE X 2)  T4, FREE  TSH  CBG MONITORING, ED  I-STAT CG4 LACTIC ACID, ED  TROPONIN I (HIGH SENSITIVITY)  TROPONIN I (HIGH SENSITIVITY)    EKG: EKG Interpretation Date/Time:  Saturday May 02 2024 11:57:33 EDT Ventricular Rate:  53 PR Interval:  319 QRS Duration:  111 QT Interval:  552 QTC Calculation: 519 R Axis:   38  Text Interpretation: Sinus rhythm Prolonged PR interval Prolonged QT interval Confirmed by Cottie Cough [phone]) on 05/02/2024 12:02:59 PM  Radiology: [facility] Chest Port 1 View Result  Date: 05/02/2024 CLINICAL DATA:  Possible sepsis. EXAM: PORTABLE CHEST 1 VIEW COMPARISON:  03/22/2024 FINDINGS: Lungs are adequately inflated without focal airspace consolidation or effusion. Cardiomediastinal silhouette and remainder of the exam is unchanged. IMPRESSION: No active disease. Electronically Signed   By: Toribio Agreste M.D.   On: 05/02/2024 12:27     .Critical Care  Performed by: Cottie Cough PARAS, MD Authorized by: Cottie Cough PARAS, MD   Critical care provider statement:    Critical care time (minutes):  60   Critical care time was exclusive of:  Separately billable procedures and treating other patients   Critical care was necessary to treat or  prevent imminent or life-threatening deterioration of the following conditions:  Circulatory failure   Critical care was time spent personally by me on the following activities:  Ordering and performing treatments and interventions, ordering and review of laboratory studies, ordering and review of radiographic studies, pulse oximetry, review of old charts, examination of patient and evaluation of patient's response to treatment   Care discussed with: admitting provider   Comments:     Pacing for complete heart block    Medications Ordered in the ED  fentaNYL  (SUBLIMAZE ) 50 MCG/ML injection (has no administration in time range)  vancomycin  (VANCOREADY) IVPB 1500 mg/300 mL (1,500 mg Intravenous New Bag/Given 05/02/24 1250)  lactated ringers  bolus 1,000 mL (0 mLs Intravenous Stopped 05/02/24 1359)  atropine  1 MG/10ML injection 0.5 mg (0.5 mg Intravenous Given 05/02/24 1214)  fentaNYL  (SUBLIMAZE ) injection 50 mcg (50 mcg Intravenous Given 05/02/24 1231)    Clinical Course as of 05/02/24 1422  Sat May 02, 2024  1153 Rectal temp 92.36F.  Sepsis orders and baer hugger ordered [MT]  1202 Pt on ertapenem  infusions per SNF summary sheet, schedule to end tomorrow.  We'll add vancomycin . [MT]  1202 Pt had bradycardic episode HR to the 30's.  HR  improved with sternal rubbing and he is verbalizing. [MT]  1210 Another bradycardic episode to HR 20's, appears to have a few missed ventricular beats.  Spontaneously resolved.  Pt still able to verbalize - will hold off on intubation.  Pacer pads applied and 0.5 mg atropine  ordered [MT]  1212 Hx of mobitz type 1 heart block per chart review. But on telemetry currently I am seeing intermittent CHB causing the bradycardia [MT]  1219 Pacing externally at 60 bpm with good capture - cardiology consulted. 50 mcg fentanyl  given for patient's discomfort [MT]  1249 Crit care team at bedside for consult  [MT]  1304 Lactic Acid, Venous: 1.3 [MT]  1304 Patient's native rhythm is 55 bpm average; pacing set to 50 bpm or lower [MT]  1333 Repaged cardiologist [MT]  1340 Cardiology consulted Dr Shlomo [MT]  1418 EP Dr Kennyth at bedside feels arrhythmia likely induced by hypothermia and may correct with heating.  ICU will admit patient for cardiac monitoring/pacing and sepsis overnight. [MT]    Clinical Course User Index [MT] Finnis Colee, Donnice PARAS, MD                                 Medical Decision Making Amount and/or Complexity of Data Reviewed Labs: ordered. Decision-making details documented in ED Course. Radiology: ordered.  Risk Prescription drug management. Decision regarding hospitalization.   This patient presents to the ED with concern for AMS, hypotension, hypothermia. This involves an extensive number of treatment options, and is a complaint that carries with it a high risk of complications and morbidity.  The differential diagnosis includes sepsis vs arrhythmia vs anemia vs other  Co-morbidities that complicate the patient evaluation: chronic wounds, indwelling foley, immunosuppressed kidney transplant -  infection risk  Additional history obtained from EMS, facility documents  External records from outside source obtained and reviewed including discharge summary from hospital  I ordered  and personally interpreted labs.  The pertinent results include:  lactate wnl; hgb 8.0, CKD near baseline levels; UA without convincing evidence of infection  I ordered imaging studies including dg chest I independently visualized and interpreted imaging which showed no evident aspiration/infection I agree with the radiologist interpretation  The patient was  maintained on a cardiac monitor.  I personally viewed and interpreted the cardiac monitored which showed an underlying rhythm of: sinus bradycardia with type 1 mobitz block, then intermittent episodes of complete heart block  Per my interpretation the patient's ECG shows sinus bradycardia with chronic T wave inversions  I ordered medication including BS antibiotics, adding vancomycin  to ertapenem  for MRSA coverage; warmed LR ordered; atropine  for bradycardia  I have reviewed the patients home medicines and have made adjustments as needed  Test Considered: lower suspicion for acute PE, CVA, stroke, meningitis  Suspect somnolence is related to hypothermia/infection and cardiac condition heart block  I requested consultation with the cardiology, critical care,  and discussed lab and imaging findings as well as pertinent plan - they recommend: rewarming for arrhythmia, pacing as needed externally; ICU admission   After the interventions noted above, I reevaluated the patient and found that they have: improved  Patient having fewer episodes of CHB as body temperature improving; mental status also improving.    Dispostion:  After consideration of the diagnostic results and the patients response to treatment, I feel that the patent would benefit from medical ICU admission      Final diagnoses:  Hypothermia, initial encounter  Sepsis, due to unspecified organism, unspecified whether acute organ dysfunction present [facility])  Complete heart block [facility])    ED Discharge Orders     None          Cottie Donnice PARAS, MD 05/02/24  1424

## 2024-05-02 NOTE — ED Triage Notes (Signed)
 BIB [facility] from [facility] for AMS. Nurse reported changes to GCS . Reported GCS 10. GCS 14 upon arrival to ER. Wounds on sacral & heel, on Abx midline Left bicep 18G 160/98 56 HR 100 RA 24 End-tidal RR 18 94 CBG

## 2024-05-02 NOTE — Consult Note (Addendum)
 WOC Nurse Consult Note: Reason for Consult: requested to assess a sacral wound and L heel. Performed remotely evaluating photos and notes. Wound type: Pressure injuries. Pressure Injury POA: Yes Sacrum wound PI stage 4. Measurement: see flowsheet. Wound bed: 80% pale red. 20% yellow slough. Drainage (amount, consistency, odor) Moderate amount. Periwound: intact, Risko eschar on the edge at 6 to 8 o'clock position. Dressing procedure/placement/frequency: Cleanse the wound bed with Vashe #[id number]. Use a syringe to wash the tunneling in jets. Not rinse, pat dry the periwound skin. Apply Aquacel [id number] filling the dept and the tunneling. Pack with gauze to guarantee the Aquacel touching the wound bed. Cover with foam dressing or ABD pad. Change daily or PRN.  Left heel unstageable PI. Measurement: see flowsheet. Wound bed: 90% dry black eschar, 10% pale red. Drainage (amount, consistency, odor) None Periwound: intact, peeling skin. Dressing procedure/placement/frequency: Cleanse with Vashe #[id number], pat dry, nor rinse. Apply Santyl  daily on the wound bed, cover with foam dressing, change every 3 days or PRN. Bed nurse recommendations: - Turn and repositioning per hospital policy - If the pt is able to go in the chair, add a chair pressure retribution pad.  - Use Prevalon boots on both sides to prevent further injuries.   WOC team will not plan to follow further. Please reconsult if further assistance is needed. Thank-you,  Lela Holm BSN, CNS, RN, [employer], WOCN  (Phone [phone])

## 2024-05-02 NOTE — Consult Note (Signed)
 Cardiology Consultation   Patient ID: Theodore Arellano MRN: [id number]; DOB: October 26, 1960  Admit date: 05/02/2024 Date of Consult: 05/02/2024  PCP:  [name], MD   [facility] Providers Cardiologist:  Jennifer JONELLE Crape, MD       History of Present Illness: Theodore Arellano is a 63 y.o. chronically ill male with a PMH significant for CAD, HTN, HLD, renal transplant, advanced CAD PAD, anxiety, dementia, chronic sacral decubitus ulcers who presented to the ED at [facility] from local SNF [facility] for altered mental status.  He has baseline confusion but became more somnolent today.  Upon arrival to the ED he was noted to be bradycardic with intermittent heart block at times requiring external pacing, for which cardiology has been consulted. He is confused and unable to provide additional history. No family at bedside to provide information.   Past Medical History:  Diagnosis Date   Anxiety    Arthritis    lower back (11/28/2017)   CAD (coronary artery disease)    Chronic lower back pain    H/O immunosuppressive therapy    chronic/notes 11/28/2017   History of gout    before kidney transport (11/28/2017)   Hyperlipidemia    Hypertension    Kidney disease    s/p kidney transplant 2011; not on dialysis now (11/28/2017)   Peripheral arterial disease (HCC)    Tobacco use     Past Surgical History:  Procedure Laterality Date   ABDOMINAL AORTOGRAM W/LOWER EXTREMITY Bilateral 12/29/2020   Procedure: ABDOMINAL AORTOGRAM W/LOWER EXTREMITY;  Surgeon: Gretta Lonni PARAS, MD;  Location: [facility] INVASIVE CV LAB;  Service: Cardiovascular;  Laterality: Bilateral;   ARTERIOVENOUS GRAFT PLACEMENT Right 04/2007   /notes 02/01/2011   AV FISTULA PLACEMENT Left 12/20/2005; 12/2006   thelbert 02/13/2011; thelbert 02/13/2011   AXILLARY-FEMORAL BYPASS GRAFT Right 01/03/2021   Procedure: RIGHT AXILLARY TO PROFUNDA FEMORAL BYPASS;  Surgeon: Oris Krystal FALCON, MD;  Location: [facility] OR;  Service:  Vascular;  Laterality: Right;   AXILLARY-FEMORAL BYPASS GRAFT Right 07/16/2023   Procedure: REDO RIGHT SARAHANN MOMENT WITH PATCH ANGIOPLSTY RIGHT POPLITEAL ARTERY.;  Surgeon: Sheree Penne Lonni, MD;  Location: [facility] OR;  Service: Vascular;  Laterality: Right;   CARDIAC CATHETERIZATION     EMBOLECTOMY  07/16/2023   Procedure: EMBOLECTOMY RIGHT Axilliofemoral and femoral/popliteal bypass graft.;  Surgeon: Sheree Penne Lonni, MD;  Location: [facility] OR;  Service: Vascular;;   FEMORAL-POPLITEAL BYPASS GRAFT Right 01/03/2021   Procedure: RIGHT FEMORAL TO BELOW KNEE POPLITEAL ARTERY BYPASS;  Surgeon: Oris Krystal FALCON, MD;  Location: [facility] OR;  Service: Vascular;  Laterality: Right;   FEMORAL-POPLITEAL BYPASS GRAFT Right 07/19/2023   Procedure: REDO RIGHT FEMORAL-POPLITEAL ARTERY BYPASS GRAFT;  Surgeon: Sheree Penne Lonni, MD;  Location: [facility] OR;  Service: Vascular;  Laterality: Right;   HD access procedures     INGUINAL HERNIA REPAIR Left    KIDNEY TRANSPLANT  2011   LEFT HEART CATH AND CORONARY ANGIOGRAPHY N/A 11/29/2017   Procedure: LEFT HEART CATH AND CORONARY ANGIOGRAPHY;  Surgeon: Anner Alm ORN, MD;  Location: [facility] INVASIVE CV LAB;  Service: Cardiovascular;  Laterality: N/A;   LEFT HEART CATH AND CORONARY ANGIOGRAPHY N/A 06/01/2019   Procedure: LEFT HEART CATH AND CORONARY ANGIOGRAPHY;  Surgeon: Verlin Lonni BIRCH, MD;  Location: [facility] INVASIVE CV LAB;  Service: Cardiovascular;  Laterality: N/A;   LEFT HEART CATHETERIZATION WITH CORONARY ANGIOGRAM  03/11/2012   Procedure: LEFT HEART CATHETERIZATION WITH CORONARY ANGIOGRAM;  Surgeon: Toribio JONELLE Fuel, MD;  Location:  [facility] CATH LAB;  Service: Cardiovascular;;   LOWER EXTREMITY ANGIOGRAM Right 07/19/2023   Procedure: LOWER EXTREMITY ANGIOGRAM;  Surgeon: Sheree Penne Bruckner, MD;  Location: [facility] OR;  Service: Vascular;  Laterality: Right;   PATCH ANGIOPLASTY Right 07/19/2023   Procedure: PATCH ANGIOPLASTY USING GEORGE  BIOLOGIC PATCH;  Surgeon: Sheree Penne Bruckner, MD;  Location: [facility] OR;  Service: Vascular;  Laterality: Right;   RIGHT HEART CATHETERIZATION N/A 03/11/2012   Procedure: RIGHT HEART CATH;  Surgeon: Toribio JONELLE Fuel, MD;  Location: [facility] CATH LAB;  Service: Cardiovascular;  Laterality: N/A;   THROMBECTOMY Right 12/2007   thelbert 02/01/2011   THROMBECTOMY / ARTERIOVENOUS GRAFT REVISION Left 12/2006   thelbert 02/13/2011   THROMBECTOMY / ARTERIOVENOUS GRAFT REVISION Right 07/2007; 10/2007;01/2008;   thelbert 01/31/2011; thelbert 02/01/2011; thelbert 01/31/2011   THROMBECTOMY AND REVISION OF ARTERIOVENTOUS (AV) GORETEX  GRAFT  03/2007 X 2   thelbert 02/01/2011   THROMBECTOMY OF BYPASS GRAFT FEMORAL- POPLITEAL ARTERY Right 07/19/2023   Procedure: THROMBECTOMY OF BYPASS GRAFT FEMORAL-POPLITEAL ARTERY;  Surgeon: Sheree Penne Bruckner, MD;  Location: [facility] OR;  Service: Vascular;  Laterality: Right;     Scheduled Meds:  Chlorhexidine  Gluconate Cloth  6 each Topical Daily   fentaNYL        heparin   5,000 Units Subcutaneous Q8H   Continuous Infusions:  ertapenem      vancomycin  1,500 mg (05/02/24 1250)   [START ON 05/04/2024] vancomycin      PRN Meds: docusate sodium , fentaNYL , polyethylene glycol  Allergies:    Allergies  Allergen Reactions   Percocet [Oxycodone -Acetaminophen ] Hives and Itching   Vicodin [Hydrocodone -Acetaminophen ] Hives and Itching   Shellfish Allergy Hives   Chocolate Flavoring Agent (Non-Screening) Hives   Chocolate Hives   Tomato Hives    Social History:   Social History   Socioeconomic History   Marital status: Single    Spouse name: Not on file   Number of children: Not on file   Years of education: Not on file   Highest education level: Not on file  Occupational History   Occupation: Unemployed truck driver  Tobacco Use   Smoking status: Every Day    Current packs/day: 0.50    Average packs/day: 0.5 packs/day for 40.0 years (20.0 ttl pk-yrs)    Types: Cigarettes   Smokeless  tobacco: Never  Vaping Use   Vaping status: Never Used  Substance and Sexual Activity   Alcohol use: Yes    Alcohol/week: 12.0 standard drinks of alcohol    Types: 12 Cans of beer per week   Drug use: No   Sexual activity: Never    Birth control/protection: None  Other Topics Concern   Not on file  Social History Narrative   Not on file   Social Drivers of Health   Financial Resource Strain: Not on file  Food Insecurity: No Food Insecurity (07/16/2023)   Hunger Vital Sign    Worried About Running Out of Food in the Last Year: Never true    Ran Out of Food in the Last Year: Never true  Transportation Needs: Unmet Transportation Needs (07/16/2023)   PRAPARE - Administrator, Civil Service (Medical): Yes    Lack of Transportation (Non-Medical): Yes  Physical Activity: Not on file  Stress: Not on file  Social Connections: Not on file  Intimate Partner Violence: Not At Risk (07/16/2023)   Humiliation, Afraid, Rape, and Kick questionnaire    Fear of Current or Ex-Partner: No    Emotionally Abused: No  Physically Abused: No    Sexually Abused: No    Family History:   Family History  Problem Relation Age of Onset   Hypertension Father    Diabetes Mother      ROS:  Please see the history of present illness.   All other ROS reviewed and negative.     Physical Exam/Data: Vitals:   05/02/24 1326 05/02/24 1330 05/02/24 1345 05/02/24 1400  BP:  101/72 112/74 104/68  Pulse:  (!) 59 (!) 59 62  Resp:  16 12 14   Temp: (!) 92.3 F (33.5 C)     TempSrc: Rectal     SpO2:  100% 98% 100%   No intake or output data in the 24 hours ending 05/02/24 1449    08/21/2023    2:27 PM 07/19/2023    6:41 AM 07/16/2023   12:04 AM  Last 3 Weights  Weight (lbs) 176 lb 12.8 oz 170 lb 170 lb 3.1 oz  Weight (kg) 80.196 kg 77.111 kg 77.2 kg     There is no height or weight on file to calculate BMI.   General: Well developed, in no acute distress.  Neck: No JVD.  Cardiac:  Bradycardic, regular Resp: Normal work of breathing.  Ext: No edema.  Neuro: Altered.   EKG:  The EKG was personally reviewed and demonstrates:  SB, 2:1 AVB Telemetry:  Telemetry was personally reviewed and demonstrates:  SB, 2:1 AVB, intermittent CHB  Relevant CV Studies: Echo 03/16/24:   Laboratory Data: High Sensitivity Troponin:   Recent Labs  Lab 05/02/24 1153  TROPONINIHS 14     Chemistry Recent Labs  Lab 05/02/24 1153  NA 144  K 4.7  CL 113*  CO2 19*  GLUCOSE 82  BUN 42*  CREATININE 2.32*  CALCIUM  9.3  GFRNONAA 31*  ANIONGAP 12    Recent Labs  Lab 05/02/24 1153  PROT 6.8  ALBUMIN  2.0*  AST 12*  ALT 7  ALKPHOS 52  BILITOT 0.5   Lipids No results for input(s): CHOL, TRIG, HDL, LABVLDL, LDLCALC, CHOLHDL in the last 168 hours.  Hematology Recent Labs  Lab 05/02/24 1153  WBC 6.0  RBC 3.03*  HGB 8.0*  HCT 26.3*  MCV 86.8  MCH 26.4  MCHC 30.4  RDW 14.5  PLT 634*   Thyroid   Recent Labs  Lab 05/02/24 1153 05/02/24 1230  TSH  --  5.717*  FREET4 1.05  --     BNPNo results for input(s): BNP, PROBNP in the last 168 hours.  DDimer No results for input(s): DDIMER in the last 168 hours.  Radiology/Studies:  [facility] Chest Port 1 View Result Date: 05/02/2024 CLINICAL DATA:  Possible sepsis. EXAM: PORTABLE CHEST 1 VIEW COMPARISON:  03/22/2024 FINDINGS: Lungs are adequately inflated without focal airspace consolidation or effusion. Cardiomediastinal silhouette and remainder of the exam is unchanged. IMPRESSION: No active disease. Electronically Signed   By: Toribio Agreste M.D.   On: 05/02/2024 12:27     Assessment and Plan: Theodore Arellano is a 63 y.o. chronically ill male with a PMH significant for CAD, HTN, HLD, renal transplant, advanced CAD PAD, anxiety, dementia, chronic sacral decubitus ulcers who presented to the ED at [facility] from local SNF [facility] for altered mental status.  He has baseline confusion but became more somnolent  today.  Upon arrival to the ED he was noted to be bradycardic with intermittent heart block at times requiring external pacing.  He was found to be profoundly hypothermic in setting  of likely sepsis.  His AV conduction heart rates have improved with warming.  Given potentially reversible cause showing already some improvement, we will avoid temporary pacemaker implant at this time.  Recommend close monitoring in medical ICU on telemetry.  Can supplement bradycardia with dopamine or epinephrine  while rewarming, if hemodynamically unstable.  His numerous comorbidities makes him a poor candidate for permanent pacemaker implant, primarily due to infectious risk.  We will monitor his heart rate closely and if he has recovery, then avoiding permanent pacemaker would be best.  #Hypothermia  #Bradycardia #CHB  Plan:  - Aggressive temperature management and rewarming patient.  - Dopamine/epinephrine  infusion as needed for bradycardia.  - Leave transcutaneous pacing pads in place. If needing despite above measures then contact cardiology for temp transvenous pacing wire.  - Follow along for continued recovery, assess daily for permanent pacing needs.    For questions or updates, please contact [facility] Please consult [email or web address] for contact info under    Signed, Fonda Kitty, MD  05/02/2024 2:49 PM

## 2024-05-02 NOTE — ED Notes (Signed)
 Patient transported to CT

## 2024-05-02 NOTE — Progress Notes (Signed)
 Pharmacy Antibiotic Note  Theodore Arellano is a 63 y.o. male for which pharmacy has been consulted for vancomycin  and ertapenem  dosing for sepsis.   Receiving Ertapenem  1gm at bedtime (14 days starting 7/20) at nursing facility for chronic decubitis ulcer and heel ulcerations.  Patient with a history of recent hospitalization for sepsis, UTI, & AKI, Hx of kidney transplant, NSTEMI. Patient presenting with AMS. Patient bradycardic in ED requiring pacing.  SCr 2.32 - above baseline WBC 6; LA 1.3; T 92.3; HR 62; RR 14  Plan: Resume ertapenem  at 1 q24h Vancomycin  1500 mg q48hr (eAUC 465) unless change in renal function Monitor WBC, fever, renal function, cultures De-escalate when able     No data recorded.  No results for input(s): WBC, CREATININE, LATICACIDVEN, VANCOTROUGH, VANCOPEAK, VANCORANDOM, GENTTROUGH, GENTPEAK, GENTRANDOM, TOBRATROUGH, TOBRAPEAK, TOBRARND, AMIKACINPEAK, AMIKACINTROU, AMIKACIN in the last 168 hours.  CrCl cannot be calculated (Patient's most recent lab result is older than the maximum 21 days allowed.).    Allergies  Allergen Reactions   Percocet [Oxycodone -Acetaminophen ] Hives and Itching   Vicodin [Hydrocodone -Acetaminophen ] Hives and Itching   Shellfish Allergy Hives   Chocolate Flavoring Agent (Non-Screening) Hives   Chocolate Hives   Tomato Hives   Microbiology results: Pending  Thank you for allowing pharmacy to be a part of this patient's care.  Dorn Buttner, PharmD, BCPS 05/02/2024 12:05 PM ED Clinical Pharmacist -  [phone]

## 2024-05-03 ENCOUNTER — Encounter (HOSPITAL_COMMUNITY): Payer: Self-pay | Admitting: Internal Medicine

## 2024-05-03 ENCOUNTER — Inpatient Hospital Stay (HOSPITAL_COMMUNITY)

## 2024-05-03 DIAGNOSIS — A419 Sepsis, unspecified organism: Secondary | ICD-10-CM | POA: Diagnosis not present

## 2024-05-03 DIAGNOSIS — I739 Peripheral vascular disease, unspecified: Secondary | ICD-10-CM

## 2024-05-03 DIAGNOSIS — Z515 Encounter for palliative care: Secondary | ICD-10-CM

## 2024-05-03 DIAGNOSIS — L89154 Pressure ulcer of sacral region, stage 4: Secondary | ICD-10-CM | POA: Diagnosis not present

## 2024-05-03 DIAGNOSIS — T68XXXA Hypothermia, initial encounter: Secondary | ICD-10-CM | POA: Diagnosis not present

## 2024-05-03 DIAGNOSIS — I442 Atrioventricular block, complete: Secondary | ICD-10-CM | POA: Diagnosis not present

## 2024-05-03 DIAGNOSIS — N1831 Chronic kidney disease, stage 3a: Secondary | ICD-10-CM

## 2024-05-03 DIAGNOSIS — R001 Bradycardia, unspecified: Secondary | ICD-10-CM

## 2024-05-03 DIAGNOSIS — I2510 Atherosclerotic heart disease of native coronary artery without angina pectoris: Secondary | ICD-10-CM | POA: Diagnosis not present

## 2024-05-03 DIAGNOSIS — D631 Anemia in chronic kidney disease: Secondary | ICD-10-CM

## 2024-05-03 DIAGNOSIS — I5021 Acute systolic (congestive) heart failure: Secondary | ICD-10-CM | POA: Diagnosis not present

## 2024-05-03 DIAGNOSIS — Z789 Other specified health status: Secondary | ICD-10-CM

## 2024-05-03 DIAGNOSIS — N179 Acute kidney failure, unspecified: Secondary | ICD-10-CM | POA: Diagnosis not present

## 2024-05-03 DIAGNOSIS — Z711 Person with feared health complaint in whom no diagnosis is made: Secondary | ICD-10-CM

## 2024-05-03 DIAGNOSIS — E162 Hypoglycemia, unspecified: Secondary | ICD-10-CM

## 2024-05-03 LAB — BASIC METABOLIC PANEL WITH GFR
Anion gap: 10 (ref 5–15)
BUN: 42 mg/dL — ABNORMAL HIGH (ref 8–23)
CO2: 20 mmol/L — ABNORMAL LOW (ref 22–32)
Calcium: 9.3 mg/dL (ref 8.9–10.3)
Chloride: 113 mmol/L — ABNORMAL HIGH (ref 98–111)
Creatinine, Ser: 2.51 mg/dL — ABNORMAL HIGH (ref 0.61–1.24)
GFR, Estimated: 28 mL/min — ABNORMAL LOW (ref >60)
Glucose, Bld: 81 mg/dL (ref 70–99)
Potassium: 4.3 mmol/L (ref 3.5–5.1)
Sodium: 143 mmol/L (ref 135–145)

## 2024-05-03 LAB — BLOOD CULTURE ID PANEL (REFLEXED) - BCID2

## 2024-05-03 LAB — CBC
HCT: 28.1 % — ABNORMAL LOW (ref 39.0–52.0)
Hemoglobin: 8.5 g/dL — ABNORMAL LOW (ref 13.0–17.0)
MCH: 26.3 pg (ref 26.0–34.0)
MCHC: 30.2 g/dL (ref 30.0–36.0)
MCV: 87 fL (ref 80.0–100.0)
Platelets: 627 K/uL — ABNORMAL HIGH (ref 150–400)
RBC: 3.23 MIL/uL — ABNORMAL LOW (ref 4.22–5.81)
RDW: 14.6 % (ref 11.5–15.5)
WBC: 8.1 K/uL (ref 4.0–10.5)
nRBC: 0 % (ref 0.0–0.2)

## 2024-05-03 LAB — ECHOCARDIOGRAM COMPLETE
Area-P 1/2: 1.88 cm2
Height: 67 in
S' Lateral: 3.3 cm
Weight: 2539.7 [oz_av]

## 2024-05-03 LAB — GLUCOSE, CAPILLARY
Glucose-Capillary: 72 mg/dL (ref 70–99)
Glucose-Capillary: 78 mg/dL (ref 70–99)
Glucose-Capillary: 135 mg/dL — ABNORMAL HIGH (ref 70–99)
Glucose-Capillary: 68 mg/dL — ABNORMAL LOW (ref 70–99)
Glucose-Capillary: 81 mg/dL (ref 70–99)
Glucose-Capillary: 87 mg/dL (ref 70–99)
Glucose-Capillary: 96 mg/dL (ref 70–99)

## 2024-05-03 LAB — PHOSPHORUS: Phosphorus: 5.7 mg/dL — ABNORMAL HIGH (ref 2.5–4.6)

## 2024-05-03 LAB — MAGNESIUM: Magnesium: 2.1 mg/dL (ref 1.7–2.4)

## 2024-05-03 MED ORDER — ROSUVASTATIN CALCIUM 20 MG PO TABS
20.0000 mg | ORAL_TABLET | Freq: Every day | ORAL | Status: DC
  Administered 2024-05-03 – 2024-05-09 (×7): 20 mg via ORAL
  Filled 2024-05-03 (×7): qty 1

## 2024-05-03 MED ORDER — ASPIRIN 81 MG PO TBEC
81.0000 mg | DELAYED_RELEASE_TABLET | Freq: Every day | ORAL | Status: DC
  Administered 2024-05-03: 81 mg via ORAL
  Filled 2024-05-03: qty 1

## 2024-05-03 MED ORDER — TACROLIMUS 1 MG PO CAPS
2.0000 mg | ORAL_CAPSULE | Freq: Every day | ORAL | Status: DC
  Administered 2024-05-03 – 2024-05-08 (×6): 2 mg via ORAL
  Filled 2024-05-03 (×7): qty 2

## 2024-05-03 MED ORDER — DEXTROSE 50 % IV SOLN
12.5000 g | INTRAVENOUS | Status: AC
  Administered 2024-05-03: 12.5 g via INTRAVENOUS

## 2024-05-03 MED ORDER — RIVAROXABAN 2.5 MG PO TABS
2.5000 mg | ORAL_TABLET | Freq: Two times a day (BID) | ORAL | Status: DC
  Administered 2024-05-03 – 2024-05-09 (×13): 2.5 mg via ORAL
  Filled 2024-05-03 (×15): qty 1

## 2024-05-03 MED ORDER — DEXTROSE 10 % IV SOLN: INTRAVENOUS | Status: DC

## 2024-05-03 MED ORDER — PREDNISONE 10 MG PO TABS
5.0000 mg | ORAL_TABLET | Freq: Every day | ORAL | Status: DC
  Administered 2024-05-03 – 2024-05-09 (×7): 5 mg via ORAL
  Filled 2024-05-03 (×7): qty 1

## 2024-05-03 MED ORDER — DEXTROSE 50 % IV SOLN
INTRAVENOUS | Status: AC
  Filled 2024-05-03: qty 50

## 2024-05-03 MED ORDER — PERFLUTREN LIPID MICROSPHERE
1.0000 mL | INTRAVENOUS | Status: AC | PRN
  Administered 2024-05-03: 6 mL via INTRAVENOUS

## 2024-05-03 MED ORDER — TAMSULOSIN HCL 0.4 MG PO CAPS
0.4000 mg | ORAL_CAPSULE | Freq: Every day | ORAL | Status: DC
  Administered 2024-05-03 – 2024-05-09 (×7): 0.4 mg via ORAL
  Filled 2024-05-03 (×7): qty 1

## 2024-05-03 MED ORDER — TACROLIMUS 1 MG PO CAPS
3.0000 mg | ORAL_CAPSULE | Freq: Every day | ORAL | Status: DC
  Administered 2024-05-03 – 2024-05-09 (×7): 3 mg via ORAL
  Filled 2024-05-03 (×7): qty 3

## 2024-05-03 MED ORDER — MYCOPHENOLATE SODIUM 180 MG PO TBEC
720.0000 mg | DELAYED_RELEASE_TABLET | Freq: Two times a day (BID) | ORAL | Status: DC
  Administered 2024-05-03 – 2024-05-09 (×13): 720 mg via ORAL
  Filled 2024-05-03 (×15): qty 4

## 2024-05-03 MED ORDER — TACROLIMUS 1 MG PO CAPS: 2.0000 mg | ORAL_CAPSULE | ORAL | Status: DC

## 2024-05-03 NOTE — Progress Notes (Addendum)
 Hypoglycemic Event  CBG: 68  Treatment: D50 25 mL (12.5 gm)  Symptoms: lethargy  Follow-up CBG: Upfz:[id number] CBG Result:135  Possible Reasons for Event: Inadequate meal intake  Comments/MD notified: Chand md    Monzerrath Mcburney C Eward Rutigliano, RN

## 2024-05-03 NOTE — Consult Note (Signed)
 Consultation Note Date: 05/03/2024   Patient Name: Theodore Arellano  DOB: 1961-08-12  MRN: [id number]  Age / Sex: 63 y.o., male  PCP: [name], MD Referring Physician: Harold Scholz, MD  Reason for Consultation: Establishing goals of care, Patient with stage 4 decubitus and advanced dementia here with symptomatic bradycardia not candidate for PPM   HPI/Patient Profile: 63 y.o. male  with past medical history of CAD, HTN, HLD, renal transplant, advanced CAD PAD, anxiety was admitted from facility on 05/02/2024 with sepsis secondary to infected decubitus ulcer on sacrum and heels, acute encephalopathy due to severe symptomatic bradycardia, intermittent complete heart block, AKI with history of renal transplant, bradycardia.   Of note, he has had 1 admission and 1 ED visit in the last 6 months.   Clinical Assessment and Goals of Care: I have reviewed medical records including EPIC notes, labs, any available advanced directives, and imaging. Received report from primary RN - no acute concerns.    Went to visit patient at bedside - no family/visitors present. Patient was lying in bed awake and alert; however, his speech is mumbled and unintelligible - he is not able to participate in complex medical discussions. No signs or non-verbal gestures of pain or discomfort noted. No respiratory distress, increased work of breathing, or secretions noted. He is frail appearing.  11:30 AM Attempted to call patient's son/Phillip to discuss diagnosis, prognosis, GOC, EOL wishes, disposition, and options - no answer - unable to leave voicemail.  12:00 PM Attempted contact again without success - unable to leave voicemail.   Primary Decision Maker: NEXT OF KIN - son/Phillip Schum    SUMMARY OF RECOMMENDATIONS   Continue full code/full scope care for now Unable to reach patient's NOK/son today - will continue attempts  to contact for GOC PMT will continue to follow and support holistically   Code Status/Advance Care Planning: Full code  Palliative Prophylaxis:  Aspiration, Delirium Protocol, Frequent Pain Assessment, Oral Care, and Turn Reposition  Additional Recommendations (Limitations, Scope, Preferences): Full Scope Treatment  Psycho-social/Spiritual:  Desire for further Chaplaincy support:no Created space and opportunity for patient and family to express thoughts and feelings regarding patient's current medical situation.  Emotional support and therapeutic listening provided.  Prognosis:  Unable to determine  Discharge Planning: To Be Determined      Primary Diagnoses: Present on Admission:  Sepsis (HCC)   I have reviewed the medical record, interviewed the patient and family, and examined the patient. The following aspects are pertinent.  Past Medical History:  Diagnosis Date   Abnormality of gait following cerebrovascular accident (CVA)    Anxiety    Arthritis    lower back (11/28/2017)   CAD (coronary artery disease)    Chronic lower back pain    H/O immunosuppressive therapy    chronic/notes 11/28/2017   History of gout    before kidney transport (11/28/2017)   Hyperlipidemia    Hypertension    Kidney disease    s/p kidney transplant 2011; not on dialysis now (11/28/2017)   Peripheral arterial disease (HCC)    Stroke (HCC)    Tobacco use    Social History   Socioeconomic History   Marital status: Single    Spouse name: Not on file   Number of children: Not on file   Years of education: Not on file   Highest education level: Not on file  Occupational History   Occupation: Unemployed truck driver  Tobacco Use   Smoking status: Every Day  Current packs/day: 0.50    Average packs/day: 0.5 packs/day for 40.0 years (20.0 ttl pk-yrs)    Types: Cigarettes   Smokeless tobacco: Never  Vaping Use   Vaping status: Never Used  Substance and Sexual Activity    Alcohol use: Yes    Alcohol/week: 12.0 standard drinks of alcohol    Types: 12 Cans of beer per week   Drug use: No   Sexual activity: Never    Birth control/protection: None  Other Topics Concern   Not on file  Social History Narrative   Not on file   Social Drivers of Health   Financial Resource Strain: Not on file  Food Insecurity: No Food Insecurity (05/02/2024)   Hunger Vital Sign    Worried About Running Out of Food in the Last Year: Never true    Ran Out of Food in the Last Year: Never true  Transportation Needs: Unmet Transportation Needs (05/02/2024)   PRAPARE - Administrator, Civil Service (Medical): Yes    Lack of Transportation (Non-Medical): Yes  Physical Activity: Not on file  Stress: Not on file  Social Connections: Not on file   Family History  Problem Relation Age of Onset   Hypertension Father    Diabetes Mother    Scheduled Meds:  aspirin  EC  81 mg Oral Daily   Chlorhexidine  Gluconate Cloth  6 each Topical Daily   insulin  aspart  0-15 Units Subcutaneous Q4H   leptospermum manuka honey  1 Application Topical Daily   mycophenolate   720 mg Oral BID   mouth rinse  15 mL Mouth Rinse 4 times per day   predniSONE   5 mg Oral Daily   rivaroxaban   2.5 mg Oral BID   rosuvastatin   20 mg Oral Daily   tacrolimus   3 mg Oral Daily   And   tacrolimus   2 mg Oral QHS   tamsulosin   0.4 mg Oral Daily   Continuous Infusions:  dextrose      ertapenem  Stopped (05/02/24 2039)   [START ON 05/04/2024] vancomycin      PRN Meds:.docusate sodium , mouth rinse, polyethylene glycol Medications Prior to Admission:  Prior to Admission medications   Medication Sig Start Date End Date Taking? Authorizing Provider  acetaminophen  (TYLENOL ) 500 MG tablet Take 500 mg by mouth every 6 (six) hours as needed for moderate pain or headache.   Yes [provider]  allopurinol (ZYLOPRIM) 100 MG tablet Take 100 mg by mouth daily.   Yes [provider]  amLODipine   (NORVASC ) 10 MG tablet Take 10 mg by mouth daily.   Yes [provider]  aspirin  EC 81 MG tablet Take 81 mg by mouth daily.   Yes [provider]  ertapenem  (INVANZ ) 1 g injection Inject 1 g into the muscle at bedtime. 04/19/24 05/03/24 Yes [provider]  furosemide  (LASIX ) 40 MG tablet Take 40 mg by mouth daily. 06/25/23  Yes [provider]  gabapentin  (NEURONTIN ) 100 MG capsule Take 300 mg by mouth 3 (three) times daily.   Yes [provider]  lactulose (CHRONULAC) 10 GM/15ML solution Take 20 g by mouth 3 (three) times daily.   Yes [provider]  losartan  (COZAAR ) 100 MG tablet Take 100 mg by mouth daily.   Yes [provider]  magnesium  oxide (MAG-OX) 400 (240 Mg) MG tablet Take 400 mg by mouth 2 (two) times daily.   Yes [provider]  mycophenolate  (MYFORTIC ) 360 MG TBEC EC tablet Take 720 mg by mouth  2 (two) times daily.   Yes [provider]  nystatin (MYCOSTATIN) 100000 UNIT/ML suspension Take 5 mLs by mouth 4 (four) times daily. For thrush 04/30/24 05/05/24 Yes [provider]  pantoprazole  (PROTONIX ) 40 MG tablet Take 40 mg by mouth daily. 11/02/19  Yes [provider]  predniSONE  (DELTASONE ) 5 MG tablet Take 5 mg by mouth daily.   Yes [provider]  rivaroxaban  (XARELTO ) 2.5 MG TABS tablet Take 1 tablet (2.5 mg total) by mouth 2 (two) times daily. 11/09/22  Yes Jagadish, Mayuri, MD  senna (SENOKOT) 8.6 MG tablet Take 17.2 mg by mouth 2 (two) times daily.   Yes [provider]  sodium chloride  0.9 % infusion Inject 400 mLs into the vein at bedtime. 126mL/hr for 4 hours following antibiotic administration. 04/28/24 05/04/24 Yes [provider]  tacrolimus  (PROGRAF ) 1 MG capsule Take 2-3 mg by mouth See admin instructions. Give 3 capsules (3mg ) by mouth in the morning and give 2 capsules (2mg ) in the afternoon.   Yes [provider]  tamsulosin  (FLOMAX ) 0.4 MG CAPS  capsule Take 0.4 mg by mouth daily. 08/28/19  Yes [provider]  triamcinolone cream (KENALOG) 0.1 % Apply 1 Application topically daily. To skin tear   Yes [provider]   Allergies  Allergen Reactions   Percocet [Oxycodone -Acetaminophen ] Hives and Itching   Vicodin [Hydrocodone -Acetaminophen ] Hives and Itching   Shellfish Allergy Hives   Chocolate Flavoring Agent (Non-Screening) Hives   Chocolate Hives   Tomato Hives   Review of Systems  Unable to perform ROS: Other    Physical Exam Vitals and nursing note reviewed.  Constitutional:      General: He is not in acute distress. Pulmonary:     Effort: No respiratory distress.  Skin:    General: Skin is warm and dry.  Neurological:     Mental Status: He is alert.     Motor: Weakness present.  Psychiatric:        Behavior: Behavior is cooperative.     Vital Signs: BP 128/80   Pulse 63   Temp 97.8 F (36.6 C) (Oral)   Resp 19   Ht 5' 7 (1.702 m)   Wt 72 kg   SpO2 100%   BMI 24.86 kg/m  Pain Scale: 0-10   Pain Score: 0-No pain   SpO2: SpO2: 100 % O2 Device:SpO2: 100 % O2 Flow Rate: .O2 Flow Rate (L/min): 2 L/min  IO: Intake/output summary:  Intake/Output Summary (Last 24 hours) at 05/03/2024 1022 Last data filed at 05/03/2024 0600 Gross per 24 hour  Intake 767.94 ml  Output 535 ml  Net 232.94 ml    LBM: Last BM Date :  (PTA) Baseline Weight: Weight: 72 kg Most recent weight: Weight: 72 kg     Palliative Assessment/Data: PPS 30%     Time In: 1100 Time Out: 1200 Time Total: 60 minutes  Signed by: Jeoffrey CHRISTELLA Sharps, NP   Please contact Palliative Medicine Team phone at [phone] for questions and concerns.  For individual provider: See Amion  *Portions of this note are a verbal dictation therefore any spelling and/or grammatical errors are due to the Dragon Medical One system interpretation.

## 2024-05-03 NOTE — Progress Notes (Signed)
 Patient having more frequent episodes of what appears to be CHB on monitor. MD Chand notified and aware, transcutaneous pads set to backup rate of 30bpm.  Patient's son was called to update. MD Chand to bedside to discuss goals of care with patient's son and patient's brother. Answered all questions and concerns, Ongoing family discussions at this time.

## 2024-05-03 NOTE — Progress Notes (Signed)
 NAME:  Theodore Arellano, MRN:  [id number], DOB:  [birth year], LOS: 1 ADMISSION DATE:  05/02/2024, CONSULTATION DATE:  05/02/5024 REFERRING MD:  Dr. Cottie - EDP , CHIEF COMPLAINT:  Septic shock with CHB on arrival to ED    History of Present Illness:  Theodore Arellano is a 63 y.o. male with a PMH significant for CAD, HTN, HLD, renal transplant, advanced CAD PAD, anxiety, who presented to the ED at [facility] from local SNF [facility] for altered mental status.  Per reports from SNF patient typically is alert and oriented x 3 with GCS 14 SNF reports GCS had dropped to 10 with confusion prompting EMS call.  Of note this is in the setting of treatment for sacral wound with ertapenem  at SNF.  On arrival to ED patient was seen bradycardic and mildly hypertensive.  During workup in emergency department patient became more progressively bradycardic with EKG concerning for complete heart block.  At that time patient was placed on external pacing.  Blood work significant for creatinine 2.32 with GFR 31, BUN 42, AST 12, hemoglobin 8, lactic within normal limit of 1.3, INR 1.3.  Given concern for septic shock with complete heart block and critical care consult for further management admission.  Pertinent  Medical History  CAD, HTN, HLD, PAD, anxiety,   Significant Hospital Events: Including procedures, antibiotic start and stop dates in addition to other pertinent events   8/2 presented with altered mental status and workup concerning for sepsis with new onset complete heart block  Interim History / Subjective:  Patient required BiPAP overnight due to apneic episodes Remained afebrile Currently on room air Had hypoglycemic episodes x 2, received  D50 percent 25 mL with improvement in blood sugar  Objective    Blood pressure 112/68, pulse 62, temperature 97.8 F (36.6 C), temperature source Oral, resp. rate 16, height 5' 7 (1.702 m), weight 72 kg, SpO2 100%.    Vent Mode: PCV FiO2 (%):  [40 %] 40 % Set  Rate:  [12 bmp] 12 bmp PEEP:  [8 cmH20] 8 cmH20 Pressure Support:  [16 cmH20] 16 cmH20   Intake/Output Summary (Last 24 hours) at 05/03/2024 0850 Last data filed at 05/03/2024 0600 Gross per 24 hour  Intake 767.94 ml  Output 535 ml  Net 232.94 ml   Filed Weights   05/02/24 1600 05/03/24 0500  Weight: 72 kg 72 kg    Examination: General: Chronically ill-appearing male, lying on the bed HEENT: [place]/AT, eyes anicteric.  moist mucus membranes Neuro: Awake, alert, following commands, because of no upper teeth, mumbling words Chest: Coarse breath sounds, no wheezes or rhonchi Heart: Regular rate and rhythm, no murmurs or gallops Abdomen: Soft, nontender, nondistended, bowel sounds present  Labs and images reviewed  Patient Lines/Drains/Airways Status     Active Line/Drains/Airways     Name Placement date Placement time Site Days   Peripheral IV 05/02/24 18 G Anterior;Left Forearm 05/02/24  --  Forearm  1   Fistula / Graft Right Upper arm 11/28/17  1900  Upper arm  2348   Midline Single Lumen 05/02/24 Left Brachial 05/02/24  1200  Brachial  1   Urethral Catheter 05/02/24  1457  --  1   Wound / Incision (Open or Dehisced) 11/08/19 Non-pressure wound Leg Left;Lower;Circumferential 11/08/19  1900  Leg  1638   Wound / Incision (Open or Dehisced) 12/27/20 Pretibial Right vertical open wound with black eschar and surrounding erythema 12/27/20  2350  Pretibial  1223  Wound 05/02/24 1500 Pressure Injury Buttocks Bilateral Unstageable - Full thickness tissue loss in which the base of the injury is covered by slough (yellow, tan, gray, green or [place]) and/or eschar (tan, [place] or black) in the wound bed. 05/02/24  1500  Buttocks  1   Wound 05/02/24 1500 Pressure Injury Heel Left Unstageable - Full thickness tissue loss in which the base of the injury is covered by slough (yellow, tan, gray, green or [place]) and/or eschar (tan, [place] or black) in the wound bed. 05/02/24  1500  Heel  1          Resolved problem list  Sepsis was ruled out  Assessment and Plan  Infected decubitus ulcer, stage IV on sacrum and unstageable on right heel, POA Staph bacteremia, likely contamination Patient has known stage IV sacral decubitus ulcer, which was debrided at nursing home He was on ertapenem  Seen by wound care, recommendations to follow Continue vancomycin  and meropenem  1 out of 4 bottles staph, likely contamination He is afebrile Continue wound care  Acute encephalopathy due to severe symptomatic bradycardia Patient presented with altered mental status, he was noted to be bradycardic into 30s He was hypothermic upon presentation Now his temperature is normal, heart rate has been in 50 - 60 Avoid sedation  Intermittent high degree heart block History of severe CAD not amenable to intervention Essential hypertension Hyperlipidemia Appreciate cardiology input Patient presentation likely related to hypothermia Once his temperature has improved, his heart rate ranging in 60s now Continue telemetry monitoring External pacer is off now Hold antihypertensive meds for now Monitor and supplement electrolytes  Acute kidney injury on CKD stage IIIa Status post renal transplant on immunosuppressive therapy Serum creatinine is uptrending Avoid nephrotoxic agent Monitor intake and output Resume prednisone  and immunosuppressive agents including mycophenolate  and tacrolimus   Recurrent hypoglycemia Patient is having recurrent hypoglycemia with fingerstick in 60s He was started on D 5 infusion overnight, still became hypoglycemic Speech and swallow evaluation is pending Switch D5-D10 infusion, monitor fingerstick with goal 140-180  Anemia of chronic disease H&H is stable, closely monitor and transfuse if less than 7   Best Practice (right click and Reselect all SmartList Selections daily)   Diet/type: NPO swallow evaluation is pending DVT prophylaxis prophylactic heparin    Pressure ulcer(s): present on admission  GI prophylaxis: N/A Lines: N/A Foley:  N/A Code Status:  full code Last date of multidisciplinary goals of care discussion pending  Labs   CBC: Recent Labs  Lab 05/02/24 1153 05/02/24 1623 05/03/24 0426  WBC 6.0 7.8 8.1  NEUTROABS 5.0  --   --   HGB 8.0* 7.9* 8.5*  HCT 26.3* 25.5* 28.1*  MCV 86.8 86.4 87.0  PLT 634* 638* 627*    Basic Metabolic Panel: Recent Labs  Lab 05/02/24 1153 05/02/24 1623 05/03/24 0426  NA 144  --  143  K 4.7  --  4.3  CL 113*  --  113*  CO2 19*  --  20*  GLUCOSE 82  --  81  BUN 42*  --  42*  CREATININE 2.32* 2.45* 2.51*  CALCIUM  9.3  --  9.3  MG  --   --  2.1  PHOS  --   --  5.7*   GFR: Estimated Creatinine Clearance: 28.2 mL/min (A) (by C-G formula based on SCr of 2.51 mg/dL (H)). Recent Labs  Lab 05/02/24 1153 05/02/24 1257 05/02/24 1623 05/03/24 0426  WBC 6.0  --  7.8 8.1  LATICACIDVEN  --  1.3  --   --  Liver Function Tests: Recent Labs  Lab 05/02/24 1153  AST 12*  ALT 7  ALKPHOS 52  BILITOT 0.5  PROT 6.8  ALBUMIN  2.0*   No results for input(s): LIPASE, AMYLASE in the last 168 hours. No results for input(s): AMMONIA in the last 168 hours.  ABG    Component Value Date/Time   PHART 7.296 (L) 07/16/2023 2027   PCO2ART 41.6 07/16/2023 2027   PO2ART 261 (H) 07/16/2023 2027   HCO3 20.4 07/16/2023 2027   TCO2 22 07/16/2023 2027   ACIDBASEDEF 6.0 (H) 07/16/2023 2027   O2SAT 100 07/16/2023 2027     Coagulation Profile: Recent Labs  Lab 05/02/24 1153  INR 1.3*    Cardiac Enzymes: No results for input(s): CKTOTAL, CKMB, CKMBINDEX, TROPONINI in the last 168 hours.  HbA1C: Hgb A1c MFr Bld  Date/Time Value Ref Range Status  07/16/2023 04:23 AM 6.4 (H) 4.8 - 5.6 % Final    Comment:    (NOTE) Pre diabetes:          5.7%-6.4%  Diabetes:              >6.4%  Glycemic control for   <7.0% adults with diabetes   11/08/2022 01:36 AM 6.0 (H) 4.8 - 5.6 %  Final    Comment:    (NOTE) Pre diabetes:          5.7%-6.4%  Diabetes:              >6.4%  Glycemic control for   <7.0% adults with diabetes     CBG: Recent Labs  Lab 05/02/24 2323 05/03/24 0349 05/03/24 0426 05/03/24 0752 05/03/24 0823  GLUCAP 87 72 78 68* 135*      Valinda Novas, MD [facility] See Amion for pager If no response to pager, please call [phone] until 7pm After 7pm, Please call E-link [phone]

## 2024-05-03 NOTE — Progress Notes (Signed)
 Patient ID: Theodore Arellano, male   DOB: May 13, 1961, 63 y.o.   MRN: [id number]     Advanced Heart Failure Rounding Note  Cardiologist: Jennifer JONELLE Crape, MD  Chief Complaint: Bradycardia Subjective:    Patient is awake but not conversational. He is no longer hypothermic.  HR NSR in 50s-60s.  Overnight, he would stop breathing and HR would drop (suspect sleep apnea).   1/4 GPCs on blood cultures.    Objective:   Weight Range: 72 kg Body mass index is 24.86 kg/m.   Vital Signs:   Temp:  [92.3 F (33.5 C)-98.7 F (37.1 C)] 97.8 F (36.6 C) (08/03 0756) Pulse Rate:  [52-81] 63 (08/03 0930) Resp:  [12-30] 19 (08/03 0930) BP: (76-149)/(49-117) 128/80 (08/03 0900) SpO2:  [92 %-100 %] 100 % (08/03 0930) FiO2 (%):  [40 %] 40 % (08/03 0248) Weight:  [72 kg] 72 kg (08/03 0500) Last BM Date :  (PTA)  Weight change: Filed Weights   05/02/24 1600 05/03/24 0500  Weight: 72 kg 72 kg    Intake/Output:   Intake/Output Summary (Last 24 hours) at 05/03/2024 0940 Last data filed at 05/03/2024 0600 Gross per 24 hour  Intake 767.94 ml  Output 535 ml  Net 232.94 ml      Physical Exam    General:  Well appearing. No resp difficulty HEENT: Normal Neck: Supple. JVP not elevated. Carotids 2+ bilat; no bruits. No lymphadenopathy or thyromegaly appreciated. Cor: PMI nondisplaced. Regular rate & rhythm. No rubs, gallops or murmurs. Lungs: Clear Abdomen: Soft, nontender, nondistended. No hepatosplenomegaly. No bruits or masses. Good bowel sounds. Extremities: Feet in boots.  Neuro: Awake, not conversational.    Telemetry   NSR 50s-60s.  No further heart block.  Personally reviewed.   Labs    CBC Recent Labs    05/02/24 1153 05/02/24 1623 05/03/24 0426  WBC 6.0 7.8 8.1  NEUTROABS 5.0  --   --   HGB 8.0* 7.9* 8.5*  HCT 26.3* 25.5* 28.1*  MCV 86.8 86.4 87.0  PLT 634* 638* 627*   Basic Metabolic Panel Recent Labs    91/97/74 1153 05/02/24 1623 05/03/24 0426  NA 144  --  143   K 4.7  --  4.3  CL 113*  --  113*  CO2 19*  --  20*  GLUCOSE 82  --  81  BUN 42*  --  42*  CREATININE 2.32* 2.45* 2.51*  CALCIUM  9.3  --  9.3  MG  --   --  2.1  PHOS  --   --  5.7*   Liver Function Tests Recent Labs    05/02/24 1153  AST 12*  ALT 7  ALKPHOS 52  BILITOT 0.5  PROT 6.8  ALBUMIN  2.0*   No results for input(s): LIPASE, AMYLASE in the last 72 hours. Cardiac Enzymes No results for input(s): CKTOTAL, CKMB, CKMBINDEX, TROPONINI in the last 72 hours.  BNP: BNP (last 3 results) No results for input(s): BNP in the last 8760 hours.  ProBNP (last 3 results) No results for input(s): PROBNP in the last 8760 hours.   D-Dimer No results for input(s): DDIMER in the last 72 hours. Hemoglobin A1C No results for input(s): HGBA1C in the last 72 hours. Fasting Lipid Panel No results for input(s): CHOL, HDL, LDLCALC, TRIG, CHOLHDL, LDLDIRECT in the last 72 hours. Thyroid  Function Tests Recent Labs    05/02/24 1230  TSH 5.717*    Other results:   Imaging    CT HEAD  WO CONTRAST ( ) Result Date: 05/02/2024 CLINICAL DATA:  Altered mental status. EXAM: CT HEAD WITHOUT CONTRAST TECHNIQUE: Contiguous axial images were obtained from the base of the skull through the vertex without intravenous contrast. RADIATION DOSE REDUCTION: This exam was performed according to the departmental dose-optimization program which includes automated exposure control, adjustment of the mA and/or kV according to patient size and/or use of iterative reconstruction technique. COMPARISON:  CT scan head from 12/08/2020. FINDINGS: Brain: No evidence of acute infarction, hemorrhage, hydrocephalus, extra-axial collection or mass lesion/mass effect. There is bilateral periventricular hypodensity, which is non-specific but most likely seen in the settings of microvascular ischemic changes. Moderate in extent. Otherwise normal appearance of brain parenchyma. Ventricles are  normal. Cerebral volume is age appropriate. Vascular: No hyperdense vessel or unexpected calcification. Intracranial arteriosclerosis. Skull: Normal. Negative for fracture or focal lesion. Sinuses/Orbits: There is complete opacification of the right frontal sinus and right maxillary sinus. Moderate to severe mucoperiosteal thickening noted in the bilateral ethmoidal air cells. Minimal mucoperiosteal thickening in the left maxillary sinus and both chambers of sphenoid sinus. Other: Visualized mastoid air cells are unremarkable. No mastoid effusion. IMPRESSION: 1. No acute intracranial abnormality. 2. Moderate chronic microvascular ischemic changes in the white matter. 3. 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 Electronically Signed   By: Ree Molt M.D.   On: 05/02/2024 15:28   CT CHEST ABDOMEN PELVIS WO CONTRAST Result Date: 05/02/2024 CLINICAL DATA:  Sepsis.  Renal failure. EXAM: CT CHEST, ABDOMEN AND PELVIS WITHOUT CONTRAST TECHNIQUE: Multidetector CT imaging of the chest, abdomen and pelvis was performed following the standard protocol without IV contrast. RADIATION DOSE REDUCTION: This exam was performed according to the departmental dose-optimization program which includes automated exposure control, adjustment of the mA and/or kV according to patient size and/or use of iterative reconstruction technique. COMPARISON:  CT scan abdomen and pelvis from 09/11/2020. FINDINGS: CT CHEST FINDINGS Cardiovascular: Normal cardiac size. No pericardial effusion. No aortic aneurysm. There are coronary artery calcifications, in keeping with coronary artery disease. There are also moderate peripheral atherosclerotic vascular calcifications of thoracic aorta and its major branches. Left atrial appendage closure device noted. Postsurgical changes noted in the proximal aortic arch. Mediastinum/Nodes: Visualized thyroid  gland appears grossly unremarkable. No solid / cystic mediastinal masses. The  esophagus is nondistended precluding optimal assessment. No mediastinal or axillary lymphadenopathy by size criteria. Evaluation of bilateral hila is limited due to lack on intravenous contrast: however, no large hilar lymphadenopathy identified. Lungs/Pleura: The central tracheo-bronchial tree is patent. There are occlusive and nonocclusive filling defects in the left lung lower lobe bronchial tree, concerning for aspiration versus mucous/secretions. There are dependent changes in bilateral lungs. Minimal upper lobe predominant emphysematous changes noted. No mass, consolidation, pleural effusion or pneumothorax. No suspicious lung nodule. Musculoskeletal: Right axillofemoral bypass graft noted. The visualized soft tissues of the chest wall are grossly unremarkable. No suspicious osseous lesions. CT ABDOMEN PELVIS FINDINGS Hepatobiliary: The liver is normal in size. Non-cirrhotic configuration. No suspicious mass. No intrahepatic or extrahepatic bile duct dilation. No calcified gallstones. Normal gallbladder wall thickness. No pericholecystic inflammatory changes. Pancreas: Unremarkable. No pancreatic ductal dilatation or surrounding inflammatory changes. Spleen: Within normal limits. No focal lesion. Adrenals/Urinary Tract: Adrenal glands are unremarkable. Moderately atrophic bilateral native kidneys. There are several hypoattenuating structures in bilateral kidneys, not well evaluated on this unenhanced exam. Left-sided pelvic renal transplant noted. There is mild fullness in the collecting system. No nephroureterolithiasis. Urinary bladder is decompressed secondary to a Foley catheter. Stomach/Bowel: Small  to moderate sliding hiatal hernia noted. No disproportionate dilation of the small or large bowel loops. No evidence of abnormal bowel wall thickening or inflammatory changes. The appendix is unremarkable. There is moderate stool burden. Vascular/Lymphatic: No ascites or pneumoperitoneum. No abdominal or  pelvic lymphadenopathy, by size criteria. No aneurysmal dilation of the major abdominal arteries. There are marked peripheral atherosclerotic vascular calcifications of the aorta and its major branches. Reproductive: Enlarged prostate. Symmetric seminal vesicles. Other: There is a tiny fat containing umbilical hernia. Focal scarring noted in the left inguinal region, likely from prior intervention. The soft tissues and abdominal wall are otherwise unremarkable. Musculoskeletal: No suspicious osseous lesions. There are moderate multilevel degenerative changes in the visualized spine. IMPRESSION: 1. No acute inflammatory process identified within the chest, abdomen or pelvis. 2. There are occlusive and nonocclusive filling defects in the left lung lower lobe bronchial tree, concerning for aspiration versus mucous/secretions. 3. Multiple other nonacute observations, as described above. Aortic Atherosclerosis (ICD10-I70.0) and Emphysema (ICD10-J43.9). Electronically Signed   By: Ree Molt M.D.   On: 05/02/2024 15:25   DG Chest Port 1 View Result Date: 05/02/2024 CLINICAL DATA:  Possible sepsis. EXAM: PORTABLE CHEST 1 VIEW COMPARISON:  03/22/2024 FINDINGS: Lungs are adequately inflated without focal airspace consolidation or effusion. Cardiomediastinal silhouette and remainder of the exam is unchanged. IMPRESSION: No active disease. Electronically Signed   By: Toribio Agreste M.D.   On: 05/02/2024 12:27     Medications:     Scheduled Medications:  aspirin  EC  81 mg Oral Daily   Chlorhexidine  Gluconate Cloth  6 each Topical Daily   insulin  aspart  0-15 Units Subcutaneous Q4H   leptospermum manuka honey  1 Application Topical Daily   mycophenolate   720 mg Oral BID   mouth rinse  15 mL Mouth Rinse 4 times per day   predniSONE   5 mg Oral Daily   rivaroxaban   2.5 mg Oral BID   rosuvastatin   20 mg Oral Daily   tacrolimus   3 mg Oral Daily   And   tacrolimus   2 mg Oral QHS   tamsulosin   0.4 mg Oral Daily     Infusions:  dextrose      ertapenem  Stopped (05/02/24 2039)   [START ON 05/04/2024] vancomycin       PRN Medications: docusate sodium , mouth rinse, polyethylene glycol    Assessment/Plan   1. Bradycardia: Sinus bradycardia and intermittent high grade heart block In setting of hypothermia (and suspected sleep apnea).  With rewarming, he is now in NSR in 50s-60s.   - He would be a poor PPM candidate due to infection risk.  - Continue to monitor, keep off nodal blockers.  2. CAD: Severe CAD based on 8/20 cath. Chronically occluded LAD and RCA with collaterals, severe disease in LCx system not amenable to PCI.  He is not a CABG candidate.  No chest pain. HS-TnI not elevated.  - Continue ASA and add statin.  - He is on Xarelto  2.5 mg bid in setting of PAD at home, can start this and stop prophylactic heparin .  3. PAD: Severe PAD, s/p right axillary-profunda/popliteal bypass with thrombosis and redo.  - ASA + Xarelto .  - Add Crestor  20 daily.  4. AKI on CKD stage 3: Baseline creatinine around 1.9, up to 2.51 today.  H/o renal transplant. BP low initially yesterday.  - Continue immunosuppressants.  - Hopefully will improve with BP stabilization.  5. ID: Has heel and sacral decubitus ulcers, has been on ertapenem  at SNF.  ?Sepsis  syndrome as cause of hypothermia though WBCs not elevated. 1/4 Staph in blood cultures may be contaminant.  - Continue ertapenem  and also given dose of vancomycin .  - Per CCM.  6. Dementia: Not speaking with me.  Uncertain baseline.  [facility] at [facility].  7. Ischemic cardiomyopathy: Echo in 7/25 at [facility] with EF 45-50%, apical hypokinesis.  He is not volume overloaded on exam.  - Will repeat echo here to assess.  8. Active smoking: Needs to quit with vascular disease/CAD.  9. Goals of care: Would involve palliative care for goals of care, do not think he can meaningfully participate but need to contact son.   Cardiology will sign off, call with questions.     Length of Stay: 1  Ezra Shuck, MD  05/03/2024, 9:40 AM  Advanced Heart Failure Team Pager [phone] (M-F; 7a - 5p)  Please contact [facility] for night-coverage after hours (5p -7a ) and weekends on [email or web address]

## 2024-05-03 NOTE — Plan of Care (Signed)

## 2024-05-03 NOTE — Progress Notes (Signed)
 Echocardiogram 2D Echocardiogram has been performed.  Damien FALCON Najiyah Paris RDCS 05/03/2024, 2:52 PM

## 2024-05-03 NOTE — Progress Notes (Signed)
 PHARMACY - PHYSICIAN COMMUNICATION CRITICAL VALUE ALERT - BLOOD CULTURE IDENTIFICATION (BCID)  Theodore Arellano is an 63 y.o. male who presented to [facility] on [facility]24 with a chief complaint of AMS  Assessment:  1/4 BCx + staph species not otherwise identified  Name of physician (or Provider) ContactedBETHA Novas, MD  Current antibiotics: ertapenem  + vancomycin    Changes to prescribed antibiotics recommended:  None  Results for orders placed or performed during the hospital encounter of 05/02/24  Blood Culture ID Panel (Reflexed) (Collected: 05/02/2024 12:30 PM)  Result Value Ref Range   Enterococcus faecalis NOT DETECTED NOT DETECTED   Enterococcus Faecium NOT DETECTED NOT DETECTED   Listeria monocytogenes NOT DETECTED NOT DETECTED   Staphylococcus species DETECTED (A) NOT DETECTED   Staphylococcus aureus (BCID) NOT DETECTED NOT DETECTED   Staphylococcus epidermidis NOT DETECTED NOT DETECTED   Staphylococcus lugdunensis NOT DETECTED NOT DETECTED   Streptococcus species NOT DETECTED NOT DETECTED   Streptococcus agalactiae NOT DETECTED NOT DETECTED   Streptococcus pneumoniae NOT DETECTED NOT DETECTED   Streptococcus pyogenes NOT DETECTED NOT DETECTED   A.calcoaceticus-baumannii NOT DETECTED NOT DETECTED   Bacteroides fragilis NOT DETECTED NOT DETECTED   Enterobacterales NOT DETECTED NOT DETECTED   Enterobacter cloacae complex NOT DETECTED NOT DETECTED   Escherichia coli NOT DETECTED NOT DETECTED   Klebsiella aerogenes NOT DETECTED NOT DETECTED   Klebsiella oxytoca NOT DETECTED NOT DETECTED   Klebsiella pneumoniae NOT DETECTED NOT DETECTED   Proteus species NOT DETECTED NOT DETECTED   Salmonella species NOT DETECTED NOT DETECTED   Serratia marcescens NOT DETECTED NOT DETECTED   Haemophilus influenzae NOT DETECTED NOT DETECTED   Neisseria meningitidis NOT DETECTED NOT DETECTED   Pseudomonas aeruginosa NOT DETECTED NOT DETECTED   Stenotrophomonas maltophilia NOT DETECTED NOT DETECTED    Candida albicans NOT DETECTED NOT DETECTED   Candida auris NOT DETECTED NOT DETECTED   Candida glabrata NOT DETECTED NOT DETECTED   Candida krusei NOT DETECTED NOT DETECTED   Candida parapsilosis NOT DETECTED NOT DETECTED   Candida tropicalis NOT DETECTED NOT DETECTED   Cryptococcus neoformans/gattii NOT DETECTED NOT DETECTED    Harlene Denna Berdine JONETTA, BCPS, BCCP Clinical Pharmacist  05/03/2024 8:48 AM   [facility] pharmacy phone numbers are listed on [email or web address]

## 2024-05-03 NOTE — Progress Notes (Signed)
 PT placed on BIPAP at this time.   05/03/24 2100  BiPAP/CPAP/SIPAP  $ Non-Invasive Ventilator  Non-Invasive Vent Subsequent  $ Face Mask XL Yes  BiPAP/CPAP/SIPAP Pt Type Adult  BiPAP/CPAP/SIPAP SERVO  Mask Type Full face mask  Dentures removed? Not applicable  Mask Size Extra large  Set Rate 12 breaths/min  Respiratory Rate 38 breaths/min  Pressure Support 6 cmH20  PEEP 8 cmH20  FiO2 (%) 40 %  Minute Ventilation 9  Leak 42  Peak Inspiratory Pressure (PIP) 11  Tidal Volume (Vt) 397  Patient Home Machine No  Patient Home Mask No  Patient Home Tubing No  Auto Titrate No  Press High Alarm 30 cmH2O  Press Low Alarm 10 cmH2O  Nasal massage performed Yes  CPAP/SIPAP surface wiped down Yes  Device Plugged into RED Power Outlet Yes

## 2024-05-03 NOTE — Evaluation (Signed)
 Clinical/Bedside Swallow Evaluation Patient Details  Name: Theodore Arellano MRN: [id number] Date of Birth: Dec 01, 1960  Today's Date: 05/03/2024 Time: SLP Start Time (ACUTE ONLY): 1248 SLP Stop Time (ACUTE ONLY): 1301 SLP Time Calculation (min) (ACUTE ONLY): 13 min  Past Medical History:  Past Medical History:  Diagnosis Date   Abnormality of gait following cerebrovascular accident (CVA)    Anxiety    Arthritis    lower back (11/28/2017)   CAD (coronary artery disease)    Chronic lower back pain    H/O immunosuppressive therapy    chronic/notes 11/28/2017   History of gout    before kidney transport (11/28/2017)   Hyperlipidemia    Hypertension    Kidney disease    s/p kidney transplant 2011; not on dialysis now (11/28/2017)   Peripheral arterial disease (HCC)    Stroke (HCC)    Tobacco use    Past Surgical History:  Past Surgical History:  Procedure Laterality Date   ABDOMINAL AORTOGRAM W/LOWER EXTREMITY Bilateral 12/29/2020   Procedure: ABDOMINAL AORTOGRAM W/LOWER EXTREMITY;  Surgeon: Gretta Lonni PARAS, MD;  Location: [facility] INVASIVE CV LAB;  Service: Cardiovascular;  Laterality: Bilateral;   ARTERIOVENOUS GRAFT PLACEMENT Right 04/2007   /notes 02/01/2011   AV FISTULA PLACEMENT Left 12/20/2005; 12/2006   thelbert 02/13/2011; thelbert 02/13/2011   AXILLARY-FEMORAL BYPASS GRAFT Right 01/03/2021   Procedure: RIGHT AXILLARY TO PROFUNDA FEMORAL BYPASS;  Surgeon: Oris Krystal FALCON, MD;  Location: [facility] OR;  Service: Vascular;  Laterality: Right;   AXILLARY-FEMORAL BYPASS GRAFT Right 07/16/2023   Procedure: REDO RIGHT SARAHANN MOMENT WITH PATCH ANGIOPLSTY RIGHT POPLITEAL ARTERY.;  Surgeon: Sheree Penne Lonni, MD;  Location: [facility] OR;  Service: Vascular;  Laterality: Right;   CARDIAC CATHETERIZATION     EMBOLECTOMY  07/16/2023   Procedure: EMBOLECTOMY RIGHT Axilliofemoral and femoral/popliteal bypass graft.;  Surgeon: Sheree Penne Lonni, MD;  Location: [facility] OR;  Service:  Vascular;;   FEMORAL-POPLITEAL BYPASS GRAFT Right 01/03/2021   Procedure: RIGHT FEMORAL TO BELOW KNEE POPLITEAL ARTERY BYPASS;  Surgeon: Oris Krystal FALCON, MD;  Location: [facility] OR;  Service: Vascular;  Laterality: Right;   FEMORAL-POPLITEAL BYPASS GRAFT Right 07/19/2023   Procedure: REDO RIGHT FEMORAL-POPLITEAL ARTERY BYPASS GRAFT;  Surgeon: Sheree Penne Lonni, MD;  Location: [facility] OR;  Service: Vascular;  Laterality: Right;   HD access procedures     INGUINAL HERNIA REPAIR Left    KIDNEY TRANSPLANT  2011   LEFT HEART CATH AND CORONARY ANGIOGRAPHY N/A 11/29/2017   Procedure: LEFT HEART CATH AND CORONARY ANGIOGRAPHY;  Surgeon: Anner Alm ORN, MD;  Location: [facility] INVASIVE CV LAB;  Service: Cardiovascular;  Laterality: N/A;   LEFT HEART CATH AND CORONARY ANGIOGRAPHY N/A 06/01/2019   Procedure: LEFT HEART CATH AND CORONARY ANGIOGRAPHY;  Surgeon: Verlin Lonni BIRCH, MD;  Location: [facility] INVAS[facility] CV [facility];  Service: Cardiovascular;  Laterality: N/A;   LEFT HEART CATHETERIZATION WITH CORONARY ANGIOGRAM  03/11/2012   Procedure: LEFT HEART CATHETERIZATION WITH CORONARY ANGIOGRAM;  Surgeon: Toribio JONELLE Fuel, MD;  Location: [facility] CATH LAB;  Service: Cardiovascular;;   LOWER EXTREMITY ANGIOGRAM Right 07/19/2023   Procedure: LOWER EXTREMITY ANGIOGRAM;  Surgeon: Sheree Penne Lonni, MD;  Location: [facility] OR;  Service: Vascular;  Laterality: Right;   PATCH ANGIOPLASTY Right 07/19/2023   Procedure: PATCH ANGIOPLASTY USING GEORGE BIOLOGIC PATCH;  Surgeon: Sheree Penne Lonni, MD;  Location: [facility] OR;  Service: Vascular;  Laterality: Right;   RIGHT HEART CATHETERIZATION N/A 03/11/2012   Procedure: RIGHT HEART CATH;  Surgeon: Toribio JONELLE Fuel, MD;  Location:  [facility] CATH LAB;  Service: Cardiovascular;  Laterality: N/A;   THROMBECTOMY Right 12/2007   thelbert 02/01/2011   THROMBECTOMY / ARTERIOVENOUS GRAFT REVISION Left 12/2006   thelbert 02/13/2011   THROMBECTOMY / ARTERIOVENOUS GRAFT REVISION Right 07/2007; 10/2007;01/2008;    thelbert 01/31/2011; thelbert 02/01/2011; thelbert 01/31/2011   THROMBECTOMY AND REVISION OF ARTERIOVENTOUS (AV) GORETEX  GRAFT  03/2007 X 2   thelbert 02/01/2011   THROMBECTOMY OF BYPASS GRAFT FEMORAL- POPLITEAL ARTERY Right 07/19/2023   Procedure: THROMBECTOMY OF BYPASS GRAFT FEMORAL-POPLITEAL ARTERY;  Surgeon: Sheree Penne Bruckner, MD;  Location: [facility] OR;  Service: Vascular;  Laterality: Right;   HPI:  Theodore Arellano is a 63 yo male presenting from SNF to ED 8/2 with AMS. Admitted with acute encephalopathy due to severe symptomatic bradycardia, intermittent high-degree heart block, and infected decubitus ulcer (stage IV on sacrum and unstageable on R heel). CTH negative. CT Chest Abdomen Pelvis shows occlusive and non-occlusive filling defects in the L lower lobe bronchial tree, concerning for aspiration versus mucous. Seen by SLP at [facility] 03/23/24 with mildly prolonged oral transit but overall functional swallowing and was recommended to continue IDDSI 7 (easy to chew) diet with thin liquids. PMH includes CAD, HTN, HLD, renal transplant, advanced CAD, anxiety    Assessment / Plan / Recommendation  Clinical Impression  Pt appears confused and his speech is unintelligible. He leans significantly to the L side despite multiple attempts at repositioning. There is anterior loss of thick-appearing secretions that were cleared with oral care. Pt was able to sip via straw but was unable to maintain pressure while initiating oral transit and a swallow, allowing large volumes of oral contents to progress back into the cup before initiating a subsequent sip. This prohibited his ability to drink 3 oz consecutively. Sips of all sizes resulted in a wet vocal quality and larger sips were followed by throat clearance or congested coughing. Pt is missing all upper dentition and while mastication was thorough, there was moderate oral residue. Suspect mentation is further affecting performance but pt exhibits the ability to  participate in an instrumental swallow study, which is recommended prior to initiating a diet. He can have meds crushed in puree and ice chips pending results of MBS.  Of note, orders also received for cognitive-linguistic evaluation which is not currently warranted given CTH, which is negative for acute abnormality. Encephalopathy is not treatable by SLP providing intensive acute therapy. Please re-consult if acute needs arise.   SLP Visit Diagnosis: Dysphagia, unspecified (R13.10)    Aspiration Risk  Moderate aspiration risk    Diet Recommendation NPO except meds;Ice chips PRN after oral care    Medication Administration: Crushed with puree    Other  Recommendations Oral Care Recommendations: Oral care QID;Oral care prior to ice chip/H20     Assistance Recommended at Discharge    Functional Status Assessment    Frequency and Duration            Prognosis Prognosis for improved oropharyngeal function: Good Barriers to Reach Goals: Cognitive deficits;Time post onset      Swallow Study   General HPI: Theodore Arellano is a 63 yo male presenting from SNF to ED 8/2 with AMS. Admitted with acute encephalopathy due to severe symptomatic bradycardia, intermittent high-degree heart block, and infected decubitus ulcer (stage IV on sacrum and unstageable on R heel). CTH negative. CT Chest Abdomen Pelvis shows occlusive and non-occlusive filling defects in the L lower lobe bronchial tree, concerning for aspiration versus mucous.  Seen by SLP at [facility] 03/23/24 with mildly prolonged oral transit but overall functional swallowing and was recommended to continue IDDSI 7 (easy to chew) diet with thin liquids. PMH includes CAD, HTN, HLD, renal transplant, advanced CAD, anxiety Type of Study: Bedside Swallow Evaluation Previous Swallow Assessment: see HPI Diet Prior to this Study: NPO Temperature Spikes Noted: No Respiratory Status: Room air History of Recent Intubation: No Behavior/Cognition:  Alert;Cooperative;Requires cueing Oral Cavity Assessment: Within Functional Limits Oral Care Completed by SLP: Yes Oral Cavity - Dentition: Missing dentition (mostly intact lower anterior dentition, missing all upper dentition) Vision: Functional for self-feeding Self-Feeding Abilities: Total assist Patient Positioning: Upright in bed Baseline Vocal Quality: Normal Volitional Cough: Weak;Congested Volitional Swallow: Able to elicit    Oral/Motor/Sensory Function Overall Oral Motor/Sensory Function: Within functional limits   Ice Chips Ice chips: Within functional limits Presentation: Spoon   Thin Liquid Thin Liquid: Impaired Presentation: Straw Oral Phase Impairments: Reduced labial seal Oral Phase Functional Implications: Left anterior spillage Pharyngeal  Phase Impairments: Suspected delayed Swallow;Multiple swallows;Throat Clearing - Immediate;Cough - Immediate    Nectar Thick Nectar Thick Liquid: Not tested   Honey Thick Honey Thick Liquid: Not tested   Puree Puree: Impaired Presentation: Spoon Oral Phase Impairments: Reduced labial seal;Reduced lingual movement/coordination Oral Phase Functional Implications: Oral residue   Solid     Solid: Impaired Oral Phase Impairments: Reduced labial seal Oral Phase Functional Implications: Oral residue      Damien Blumenthal, M.A., CCC-SLP Speech Language Pathology, Acute Rehabilitation Services  Secure Chat preferred [phone]  05/03/2024,2:08 PM

## 2024-05-04 ENCOUNTER — Inpatient Hospital Stay (HOSPITAL_COMMUNITY)

## 2024-05-04 DIAGNOSIS — Z7189 Other specified counseling: Secondary | ICD-10-CM

## 2024-05-04 DIAGNOSIS — T68XXXA Hypothermia, initial encounter: Secondary | ICD-10-CM | POA: Diagnosis not present

## 2024-05-04 DIAGNOSIS — A419 Sepsis, unspecified organism: Secondary | ICD-10-CM | POA: Diagnosis not present

## 2024-05-04 DIAGNOSIS — R1310 Dysphagia, unspecified: Secondary | ICD-10-CM | POA: Diagnosis not present

## 2024-05-04 DIAGNOSIS — I255 Ischemic cardiomyopathy: Secondary | ICD-10-CM

## 2024-05-04 DIAGNOSIS — Z515 Encounter for palliative care: Secondary | ICD-10-CM | POA: Diagnosis not present

## 2024-05-04 DIAGNOSIS — L89154 Pressure ulcer of sacral region, stage 4: Secondary | ICD-10-CM | POA: Diagnosis not present

## 2024-05-04 DIAGNOSIS — N179 Acute kidney failure, unspecified: Secondary | ICD-10-CM | POA: Diagnosis not present

## 2024-05-04 DIAGNOSIS — I442 Atrioventricular block, complete: Secondary | ICD-10-CM | POA: Diagnosis not present

## 2024-05-04 DIAGNOSIS — N1832 Chronic kidney disease, stage 3b: Secondary | ICD-10-CM

## 2024-05-04 LAB — CULTURE, BLOOD (ROUTINE X 2)

## 2024-05-04 LAB — CBC
HCT: 27.9 % — ABNORMAL LOW (ref 39.0–52.0)
Hemoglobin: 8.6 g/dL — ABNORMAL LOW (ref 13.0–17.0)
MCH: 26.5 pg (ref 26.0–34.0)
MCHC: 30.8 g/dL (ref 30.0–36.0)
MCV: 85.8 fL (ref 80.0–100.0)
Platelets: 561 K/uL — ABNORMAL HIGH (ref 150–400)
RBC: 3.25 MIL/uL — ABNORMAL LOW (ref 4.22–5.81)
RDW: 14.6 % (ref 11.5–15.5)
WBC: 7.8 K/uL (ref 4.0–10.5)
nRBC: 0 % (ref 0.0–0.2)

## 2024-05-04 LAB — GLUCOSE, CAPILLARY
Glucose-Capillary: 104 mg/dL — ABNORMAL HIGH (ref 70–99)
Glucose-Capillary: 106 mg/dL — ABNORMAL HIGH (ref 70–99)
Glucose-Capillary: 117 mg/dL — ABNORMAL HIGH (ref 70–99)
Glucose-Capillary: 119 mg/dL — ABNORMAL HIGH (ref 70–99)
Glucose-Capillary: 123 mg/dL — ABNORMAL HIGH (ref 70–99)
Glucose-Capillary: 87 mg/dL (ref 70–99)

## 2024-05-04 LAB — BASIC METABOLIC PANEL WITH GFR
Anion gap: 10 (ref 5–15)
BUN: 35 mg/dL — ABNORMAL HIGH (ref 8–23)
CO2: 21 mmol/L — ABNORMAL LOW (ref 22–32)
Calcium: 9.5 mg/dL (ref 8.9–10.3)
Chloride: 111 mmol/L (ref 98–111)
Creatinine, Ser: 2.06 mg/dL — ABNORMAL HIGH (ref 0.61–1.24)
GFR, Estimated: 36 mL/min — ABNORMAL LOW (ref >60)
Glucose, Bld: 98 mg/dL (ref 70–99)
Potassium: 3.9 mmol/L (ref 3.5–5.1)
Sodium: 142 mmol/L (ref 135–145)

## 2024-05-04 LAB — PHOSPHORUS: Phosphorus: 4.2 mg/dL (ref 2.5–4.6)

## 2024-05-04 LAB — MAGNESIUM: Magnesium: 2.1 mg/dL (ref 1.7–2.4)

## 2024-05-04 MED ORDER — ENSURE PLUS HIGH PROTEIN PO LIQD
237.0000 mL | Freq: Three times a day (TID) | ORAL | Status: DC
  Administered 2024-05-04 – 2024-05-09 (×10): 237 mL via ORAL

## 2024-05-04 MED ORDER — ASPIRIN 81 MG PO CHEW
81.0000 mg | CHEWABLE_TABLET | Freq: Every day | ORAL | Status: DC
  Administered 2024-05-04 – 2024-05-09 (×6): 81 mg via ORAL
  Filled 2024-05-04 (×6): qty 1

## 2024-05-04 MED ORDER — THIAMINE HCL 100 MG/ML IJ SOLN
100.0000 mg | Freq: Every day | INTRAMUSCULAR | Status: DC
  Administered 2024-05-04 – 2024-05-09 (×6): 100 mg via INTRAVENOUS
  Filled 2024-05-04 (×6): qty 2

## 2024-05-04 NOTE — TOC Initial Note (Signed)
 Transition of Care [facility]) - Initial/Assessment Note    Patient Details  Name: Theodore Arellano MRN: [id number] Date of Birth: March 01, 1961  Transition of Care [facility]) CM/SW Contact:    Justina Delcia Czar, RN Phone Number:[phone] 05/04/2024, 2:04 PM  Clinical Narrative:                 Spoke to son, Theodore Arellano at bedside. Pt is currently residing at [facility] for past 2 months. CSW sent referral for follow up on SNF.  Will continue to follow up for dc needs.   Expected Discharge Plan: Skilled Nursing Facility Barriers to Discharge: Continued Medical Work up   Patient Goals and CMS Choice   CMS Medicare.gov Compare Post Acute Care list provided to:: Patient Represenative (must comment)   [place] ownership interest in [facility].provided to:: Adult Children    Expected Discharge Plan and Services In-house Referral: Clinical Social Work Discharge Planning Services: CM Consult Post Acute Care Choice: Skilled Nursing Facility Living arrangements for the past 2 months: Skilled Nursing Facility                                      Prior Living Arrangements/Services Living arrangements for the past 2 months: Skilled Nursing Facility Lives with:: Facility Resident                   Activities of Daily Living   ADL Screening (condition at time of admission) Independently performs ADLs?: No Does the patient have a NEW difficulty with bathing/dressing/toileting/self-feeding that is expected to last >3 days?: Yes (Initiates electronic notice to provider for possible OT consult) Does the patient have a NEW difficulty with getting in/out of bed, walking, or climbing stairs that is expected to last >3 days?: Yes (Initiates electronic notice to provider for possible PT consult) Does the patient have a NEW difficulty with communication that is expected to last >3 days?: Yes (Initiates electronic notice to provider for possible SLP consult) Is the patient deaf  or have difficulty hearing?: No Does the patient have difficulty seeing, even when wearing glasses/contacts?: Yes Does the patient have difficulty concentrating, remembering, or making decisions?: Yes  Permission Sought/Granted                  Emotional Assessment              Admission diagnosis:  Complete heart block (HCC) [I44.2] Hypothermia, initial encounter [T68.XXXA] Sepsis (HCC) [A41.9] Sepsis, due to unspecified organism, unspecified whether acute organ dysfunction present [facility]) [A41.9] Patient Active Problem List   Diagnosis Date Noted   Complete heart block (HCC) 05/03/2024   Vascular insufficiency of extremity 07/15/2023   Bradycardia 11/08/2022   HTN (hypertension) 11/08/2022   Hypomagnesemia 11/08/2022   Chronic systolic CHF (congestive heart failure) (HCC) 11/08/2022   Impaired functional mobility, balance, gait, and endurance 02/07/2021   Hypertension    Critical lower limb ischemia (HCC) 01/05/2021   Anticoagulant long-term use    PAD (peripheral artery disease) (HCC)    Leg pain, anterior, right 12/27/2020   Non-healing wound of right lower extremity 12/27/2020   Cellulitis of right lower extremity 12/27/2020   Arterial leg ulcer (HCC) 12/27/2020   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   Osteomyelitis of left fibula (HCC) 01/13/2020   Cellulitis of left lower extremity 01/11/2020   Sepsis (HCC) 01/11/2020   DM (diabetes mellitus) type II, controlled,  with peripheral vascular disorder (HCC) 01/11/2020   Chronic pain syndrome 01/11/2020   Sepsis due to cellulitis (HCC) 01/11/2020   ESRD (end stage renal disease) (HCC) 01/06/2020   Acute kidney injury (HCC) 11/08/2019   COVID-19 virus infection 11/08/2019   History of DVT (deep vein thrombosis) 11/08/2019   Hx of CABG 09/04/2019   Disorder of arteries and arterioles, unspecified (HCC) 07/21/2019   Wound of left leg 07/21/2019   Left leg pain 06/28/2019   Abnormal ECG 06/05/2019    Mobitz type I Wenckebach atrioventricular block 06/05/2019   Unstable angina pectoris due to coronary arteriosclerosis (HCC) 05/30/2019   Left ventricular dysfunction 01/08/2018   Coronary artery disease    Coronary artery disease due to lipid rich plaque    Elevated troponin    Tobacco abuse 09/02/2017   Chest pain 08/27/2016   Renal transplant recipient 08/27/2016   Essential hypertension 08/27/2016   HLD (hyperlipidemia) 08/27/2016   Type 2 diabetes mellitus with vascular disease (HCC) 08/27/2016   Abnormal nuclear stress test 11/02/2015   CKD (chronic kidney disease) 11/02/2015   Current smoker 11/02/2015   Dyslipidemia 11/02/2015   Immunosuppression (HCC) 09/02/2014   Rash 09/02/2014   Left groin pain 06/24/2013   Anemia 05/21/2012   Leucopenia 05/21/2012   Personal history of immunosupression therapy 05/21/2012   PCP:  [name], MD Pharmacy:   [facility] [phone] - [place], [place] - [address] AT [address] [place] [address] Phone: [phone] Fax: [phone]     Social Drivers of Health (SDOH) Social History: SDOH Screenings   Food Insecurity: No Food Insecurity (05/02/2024)  Housing: Low Risk  (05/02/2024)  Transportation Needs: Unmet Transportation Needs (05/02/2024)  Utilities: Not At Risk (05/02/2024)  Tobacco Use: High Risk (05/03/2024)   SDOH Interventions:     Readmission Risk Interventions     No data to display

## 2024-05-04 NOTE — Progress Notes (Signed)
 Modified Barium Swallow Study  Patient Details  Name: Theodore Arellano MRN: [id number] Date of Birth: [birth year]  Today's Date: 05/04/2024  Modified Barium Swallow completed.  Full report located under Chart Review in the Imaging Section.  History of Present Illness 63 y.o. male presenting from SNF rehab 8/2 with AMS and hypothermia. Became bradycardic in ED, intermittently going into complete heart block and requiring transcutaneous pacing. Ongoing workup concerning for sepsis. PMH includes HLD, HTN, CKD III, PAD, systolic HF, blindness in R eye, CABG, gout, DVT, cocaine  abuse, critical lower limb ischemia   Clinical Impression Pt exhibits mild oropharyngeal dysphagia with deficits related to efficiency more so than safety. He has difficulty sitting upright so the majority of the study was conducted in his natural, anterior-leaning posture. No penetration/aspiration occurred. Lingual pumping was noted suggesting oral inefficiency but he was attentive to mastication of solids despite upper edentulism. Mild pharyngeal residue (<50%) accumulated with purees and solids more than liquids. A liquid wash was effecitve in clearing the majority of solid residuals. The coughing and wet vocal quality that was previously observed was not noted today. Suspect his positioning will significantly impact performance. There was distal esophageal retention of barium and the tablet noted during the sweep so recommend keeping pt sitting upright for ~30 minutes after meals. Recommend Dys 2 solids with thin liquids and meds whole in puree. Discussed with RN and will f/u. Factors that may increase risk of adverse event in presence of aspiration Noe & Lianne 2021): Reduced cognitive function;Limited mobility;Frail or deconditioned;Dependence for feeding and/or oral hygiene;Inadequate oral hygiene  Swallow Evaluation Recommendations Recommendations: PO diet PO Diet Recommendation: Dysphagia 2 (Finely chopped);Thin liquids  (Level 0) Liquid Administration via: Cup Medication Administration: Whole meds with puree Supervision: Full assist for feeding;Full supervision/cueing for swallowing strategies Swallowing strategies  : Minimize environmental distractions;Slow rate;Small bites/sips;Check for anterior loss;Follow solids with liquids Postural changes: Position pt fully upright for meals;Stay upright 30-60 min after meals Oral care recommendations: Oral care BID (2x/day)     Damien Blumenthal, M.A., CCC-SLP Speech Language Pathology, Acute Rehabilitation Services  Secure Chat preferred [phone]  05/04/2024,1:41 PM

## 2024-05-04 NOTE — Evaluation (Signed)
 Physical Therapy Evaluation Patient Details Name: Theodore Arellano MRN: [id number] DOB: [birth year] Today's Date: 05/04/2024  History of Present Illness  63 y.o. male presenting from SNF rehab 8/2 with AMS and hypothermia. Became bradycardic in ED, intermittently going into complete heart block and requiring transcutaneous pacing. Ongoing workup concerning for sepsis. PMH includes HLD, HTN, CKD III, PAD, systolic HF, blindness in R eye, CABG, gout, DVT, cocaine  abuse, critical lower limb ischemia.  Clinical Impression  Pt admitted with above diagnosis. From SNF, reports working with therapy to regain independence PTA. Currently requires up to max assist +2 for bed mobility. Educated on safe techniques to preserve skin integrity with pressure sores. HR 50s-60s throughout session. Tolerated sitting EOB majority of session with bil UE support. SpO2 99% on RA. No dizziness. Pt currently with functional limitations due to the deficits listed below (see PT Problem List). Pt will benefit from acute skilled PT to increase their independence and safety with mobility to allow discharge.           If plan is discharge home, recommend the following: Two people to help with walking and/or transfers;Two people to help with bathing/dressing/bathroom;Assistance with cooking/housework;Direct supervision/assist for medications management;Direct supervision/assist for financial management;Assist for transportation;Help with stairs or ramp for entrance;Supervision due to cognitive status   Can travel by private vehicle   No    Equipment Recommendations None recommended by PT  Recommendations for Other Services       Functional Status Assessment Patient has had a recent decline in their functional status and demonstrates the ability to make significant improvements in function in a reasonable and predictable amount of time.     Precautions / Restrictions Precautions Precautions: Fall Recall of  Precautions/Restrictions: Impaired Precaution/Restrictions Comments: monitor HR (brady), R eye blind. pressure wounds sacrum and bil heels Required Braces or Orthoses:  (Prevalon boots) Restrictions Weight Bearing Restrictions Per Provider Order: No      Mobility  Bed Mobility Overal bed mobility: Needs Assistance Bed Mobility: Supine to Sit, Sit to Supine     Supine to sit: Mod assist, +2 for physical assistance, +2 for safety/equipment, HOB elevated, Used rails Sit to supine: +2 for physical assistance, +2 for safety/equipment, Max assist   General bed mobility comments: Assist/cues to partially roll/reach to bedrails with assist for BLE off of bed. Max A x 2 for LE back to bed and trunk guidance. Difficulty reaching rail with LUE but eventually able to grasp and pull with guidance. Roll technique utilized to minimize friction.    Transfers Overall transfer level: Needs assistance                 General transfer comment: deferred OOB d/t recent pause of temporary pacing at nursing request. Max A x 2 to lift and scoot along HOB, using bed pad to reduce friction.    Ambulation/Gait                  Stairs            Wheelchair Mobility     Tilt Bed    Modified Rankin (Stroke Patients Only)       Balance Overall balance assessment: Needs assistance Sitting-balance support: Feet supported, Single extremity supported, Bilateral upper extremity supported Sitting balance-Leahy Scale: Poor Sitting balance - Comments: able to sit statically briefly but tendency to lean onto L elbow to keep balance  Pertinent Vitals/Pain Pain Assessment Pain Assessment: Faces Faces Pain Scale: Hurts even more Pain Location: LLE with movement/touch Pain Descriptors / Indicators: Grimacing, Guarding Pain Intervention(s): Monitored during session, Repositioned, Limited activity within patient's tolerance    Home  Living Family/patient expects to be discharged to:: Skilled nursing facility                   Additional Comments: Has been at [facility] rehab for 3-4 weeks. Prior to this, pt reports staying with son    Prior Function Prior Level of Function : Needs assist             Mobility Comments: In Oct 2024, ambulatory with a cane. Difficult to understand pt's speech today- reports working more on standing and difficulty with walking at SNF rehab ADLs Comments: In Oct 2024, pt was managing ADLs but had difficulty with IADLs. Currently, pt working with therapy on ADLs at [facility] rehab     Extremity/Trunk Assessment   Upper Extremity Assessment Upper Extremity Assessment: Defer to OT evaluation    Lower Extremity Assessment Lower Extremity Assessment: Generalized weakness;Difficult to assess due to impaired cognition (pain throughout LLE, mostly distal, even to light touch)    Cervical / Trunk Assessment Cervical / Trunk Assessment: Kyphotic  Communication   Communication Communication: Impaired Factors Affecting Communication: Reduced clarity of speech    Cognition Arousal: Alert Behavior During Therapy: Flat affect   PT - Cognitive impairments: No family/caregiver present to determine baseline                         Following commands: Impaired Following commands impaired: Only follows one step commands consistently, Follows one step commands with increased time     Cueing Cueing Techniques: Verbal cues, Gestural cues, Tactile cues     General Comments General comments (skin integrity, edema, etc.): HR 50s-60s throughout session. Asymptomatic. SpO2 99% on RA    Exercises General Exercises - Lower Extremity Ankle Circles/Pumps: AROM, Both, 10 reps, Supine   Assessment/Plan    PT Assessment Patient needs continued PT services  PT Problem List Decreased strength;Decreased range of motion;Decreased activity tolerance;Decreased balance;Decreased  mobility;Decreased cognition;Decreased knowledge of use of DME;Decreased safety awareness;Decreased knowledge of precautions;Cardiopulmonary status limiting activity;Pain;Decreased skin integrity       PT Treatment Interventions DME instruction;Gait training;Functional mobility training;Therapeutic activities;Therapeutic exercise;Balance training;Neuromuscular re-education;Cognitive remediation;Patient/family education;Wheelchair mobility training;Modalities    PT Goals (Current goals can be found in the Care Plan section)  Acute Rehab PT Goals Patient Stated Goal: Go back to rehab PT Goal Formulation: With patient Time For Goal Achievement: 05/18/24 Potential to Achieve Goals: Fair    Frequency Min 1X/week     Co-evaluation PT/OT/SLP Co-Evaluation/Treatment: Yes Reason for Co-Treatment: Complexity of the patient's impairments (multi-system involvement);For patient/therapist safety;Necessary to address cognition/behavior during functional activity PT goals addressed during session: Mobility/safety with mobility;Balance OT goals addressed during session: ADL's and self-care;Strengthening/ROM       AM-PAC PT 6 Clicks Mobility  Outcome Measure Help needed turning from your back to your side while in a flat bed without using bedrails?: A Lot Help needed moving from lying on your back to sitting on the side of a flat bed without using bedrails?: Total Help needed moving to and from a bed to a chair (including a wheelchair)?: Total Help needed standing up from a chair using your arms (e.g., wheelchair or bedside chair)?: Total Help needed to walk in hospital room?: Total Help needed climbing  3-5 steps with a railing? : Total 6 Click Score: 7    End of Session   Activity Tolerance: Treatment limited secondary to medical complications (Comment) (RN request no OOB activities today) Patient left: in bed;with call bell/phone within reach;with bed alarm set;with family/visitor  present;with SCD's reapplied (Prevalon boots on, Pt 1/4 turn towards Rt for pressure relief.) Nurse Communication: Mobility status PT Visit Diagnosis: Other abnormalities of gait and mobility (R26.89);Muscle weakness (generalized) (M62.81);Difficulty in walking, not elsewhere classified (R26.2);Other symptoms and signs involving the nervous system (R29.898);Pain Pain - Right/Left: Left Pain - part of body: Leg    Time: 8943-8882 PT Time Calculation (min) (ACUTE ONLY): 21 min   Charges:                 Leontine Roads, PT, DPT [facility]  Rehabilitation Services Physical Therapist Office: [phone] Website: [email or web address]   Leontine GORMAN Roads 05/04/2024, 12:43 PM

## 2024-05-04 NOTE — Progress Notes (Signed)
 Heart Failure Navigator Progress Note  Assessed for Heart & Vascular TOC clinic readiness.  Patient does not meet criteria due to per MD note patient with Advanced Dementia. No HF TOC.   Navigator will sign off at this time.   Stephane Haddock, BSN, Scientist, clinical (histocompatibility and immunogenetics) Only

## 2024-05-04 NOTE — Progress Notes (Signed)
 NAME:  Theodore Arellano, MRN:  [id number], DOB:  09-11-1961, LOS: 2 ADMISSION DATE:  05/02/2024, CONSULTATION DATE:  05/02/5024 REFERRING MD:  Dr. Cottie - EDP , CHIEF COMPLAINT:  Septic shock with CHB on arrival to ED    History of Present Illness:  Theodore Arellano is a 63 y.o. male with a PMH significant for CAD, HTN, HLD, renal transplant, advanced CAD PAD, anxiety, who presented to the ED at [facility] from local SNF [facility] for altered mental status.  Per reports from SNF patient typically is alert and oriented x 3 with GCS 14 SNF reports GCS had dropped to 10 with confusion prompting EMS call.  Of note this is in the setting of treatment for sacral wound with ertapenem  at SNF.  On arrival to ED patient was seen bradycardic and mildly hypertensive.  During workup in emergency department patient became more progressively bradycardic with EKG concerning for complete heart block.  At that time patient was placed on external pacing.  Blood work significant for creatinine 2.32 with GFR 31, BUN 42, AST 12, hemoglobin 8, lactic within normal limit of 1.3, INR 1.3.  Given concern for septic shock with complete heart block and critical care consult for further management admission.  Pertinent  Medical History  CAD, HTN, HLD, PAD, anxiety, immunocompromised, s/p Ktxp   Significant Hospital Events: Including procedures, antibiotic start and stop dates in addition to other pertinent events   8/2 presented with altered mental status and workup concerning for sepsis with new onset complete heart block 8/3 cards signed off  8/4 still in and out of CHB requiring TCP, I have re-engaged cards   Interim History / Subjective:   BiPAP for sleep apnea Intermitted CHB req TCP 30   BMP is pending   Objective    Blood pressure (!) 120/97, pulse 60, temperature 97.6 F (36.4 C), temperature source Oral, resp. rate 16, height 5' 7 (1.702 m), weight 74.3 kg, SpO2 98%.    FiO2 (%):  [40 %] 40 % PEEP:  [8  cmH20] 8 cmH20 Pressure Support:  [6 cmH20] 6 cmH20   Intake/Output Summary (Last 24 hours) at 05/04/2024 1059 Last data filed at 05/04/2024 0900 Gross per 24 hour  Intake 1206.93 ml  Output 785 ml  Net 421.93 ml   Filed Weights   05/02/24 1600 05/03/24 0500 05/04/24 0500  Weight: 72 kg 72 kg 74.3 kg    Examination: General: Chronically ill adult M who appears older than stated age HEENT: [place] Chronic appearing R eye defect pink mm  Neuro:  Awake, answering some simple questions but lots of unintelligible speech and severe dysarthria  Pulm: even unlabored  Heart: bradycardic rate , irregular rhythm  Abdomen: soft  MSK: no obvious acute joint deformity   Resolved problem list  Sepsis was ruled out  Assessment and Plan   Stg IV sacral decub POA Unstageable R heel ulcer POA  + Bcx, likely contaminant  -Patient has known stage IV sacral decubitus ulcer, which was debrided at nursing home, and on erta for this -bCx w staph haemolyticus x1, likely contaminant  P -vanc, ertapenem  -WOC has seen, appreciate recs   Acute encephalopathy superimposed on some degree of underlying dementia Dysphagia  -reportedly AAOx3 at nursing home, but outpt notes also speak to him being a poor historian etc  P -delirium precautions  -NPO + crushed meds in puree; plan for MBS 8/4. I have also ordered for cortrak which we can circle back on  after MBS   CHB  HFrEF CAD PAD  HLD  -still w intermittent CHB req TCP though hypothermia improved  P -will re-engage cards  -sounds like overall a  poor device candidate but want to discuss further w cards, which may also help w GOC discussions -crestor , ASA, xarelto     AKI on CKD III  S/p Ktxp on immunosuppressive therapy  P -myfortic  can't be crushed-- If he doesn't do well w MBS will need to change agents -cont tacro and pred  -follow renal indices UOP   Inadequate PO intake  Hypoglycemia P -cont d5gtt and follow CBGs -plans for diet pending  MBS +/- cortrak   Anemia of chronic dz Thrombocytosis  -PRN CBC  Suspected underlying OSA -noct NPPV  GOC -palliative care has been engaged, hadn't been able to reach son -CCM d/w son at some point 8/3 and sounds like all aggressive interventions were desired   Best Practice (right click and Reselect all SmartList Selections daily)   Diet/type: NPO swallow evaluation is pending DVT prophylaxis DOAC Pressure ulcer(s): present on admission  GI prophylaxis: N/A Lines: N/A Foley:  N/A Code Status:  full code Last date of multidisciplinary goals of care discussion: sounds like son was updated 8/3   A person was present at bedside 8/4 but he declined my presence in favor of a personal call   Labs   CBC: Recent Labs  Lab 05/02/24 1153 05/02/24 1623 05/03/24 0426 05/04/24 0829  WBC 6.0 7.8 8.1 7.8  NEUTROABS 5.0  --   --   --   HGB 8.0* 7.9* 8.5* 8.6*  HCT 26.3* 25.5* 28.1* 27.9*  MCV 86.8 86.4 87.0 85.8  PLT 634* 638* 627* 561*    Basic Metabolic Panel: Recent Labs  Lab 05/02/24 1153 05/02/24 1623 05/03/24 0426 05/04/24 0829  NA 144  --  143 142  K 4.7  --  4.3 3.9  CL 113*  --  113* 111  CO2 19*  --  20* 21*  GLUCOSE 82  --  81 98  BUN 42*  --  42* 35*  CREATININE 2.32* 2.45* 2.51* 2.06*  CALCIUM  9.3  --  9.3 9.5  MG  --   --  2.1 2.1  PHOS  --   --  5.7* 4.2   GFR: Estimated Creatinine Clearance: 34.3 mL/min (A) (by C-G formula based on SCr of 2.06 mg/dL (H)). Recent Labs  Lab 05/02/24 1153 05/02/24 1257 05/02/24 1623 05/03/24 0426 05/04/24 0829  WBC 6.0  --  7.8 8.1 7.8  LATICACIDVEN  --  1.3  --   --   --     Liver Function Tests: Recent Labs  Lab 05/02/24 1153  AST 12*  ALT 7  ALKPHOS 52  BILITOT 0.5  PROT 6.8  ALBUMIN  2.0*   No results for input(s): LIPASE, AMYLASE in the last 168 hours. No results for input(s): AMMONIA in the last 168 hours.  ABG    Component Value Date/Time   PHART 7.296 (L) 07/16/2023 2027   PCO2ART  41.6 07/16/2023 2027   PO2ART 261 (H) 07/16/2023 2027   HCO3 20.4 07/16/2023 2027   TCO2 22 07/16/2023 2027   ACIDBASEDEF 6.0 (H) 07/16/2023 2027   O2SAT 100 07/16/2023 2027     Coagulation Profile: Recent Labs  Lab 05/02/24 1153  INR 1.3*    Cardiac Enzymes: No results for input(s): CKTOTAL, CKMB, CKMBINDEX, TROPONINI in the last 168 hours.  HbA1C: Hgb A1c MFr Bld  Date/Time Value Ref  Range Status  07/16/2023 04:23 AM 6.4 (H) 4.8 - 5.6 % Final    Comment:    (NOTE) Pre diabetes:          5.7%-6.4%  Diabetes:              >6.4%  Glycemic control for   <7.0% adults with diabetes   11/08/2022 01:36 AM 6.0 (H) 4.8 - 5.6 % Final    Comment:    (NOTE) Pre diabetes:          5.7%-6.4%  Diabetes:              >6.4%  Glycemic control for   <7.0% adults with diabetes     CBG: Recent Labs  Lab 05/03/24 1549 05/03/24 1928 05/04/24 0005 05/04/24 0501 05/04/24 0734  GLUCAP 87 96 117* 106* 104*    CRITICAL CARE Performed by: Ronnald FORBES Gave   Total critical care time: 42 minutes  Critical care time was exclusive of separately billable procedures and treating other patients. Critical care was necessary to treat or prevent imminent or life-threatening deterioration.  Critical care was time spent personally by me on the following activities: development of treatment plan with patient and/or surrogate as well as nursing, discussions with consultants, evaluation of patient's response to treatment, examination of patient, obtaining history from patient or surrogate, ordering and performing treatments and interventions, ordering and review of laboratory studies, ordering and review of radiographic studies, pulse oximetry and re-evaluation of patient's condition.      Ronnald Gave MSN, AGACNP-BC [place] [facility] Amion for pager 05/04/2024, 10:59 AM

## 2024-05-04 NOTE — Progress Notes (Addendum)
 Advanced Heart Failure Rounding Note  Cardiologist: Jennifer JONELLE Crape, MD   Chief Complaint: Bradycardia  Subjective:    Advanced Heart Failure asked to reevaluate patient today d/t sinus bradycardia with pauses requiring transcutaneous pacing.  Sitting up in bed. Family at bedside. Speech incoherent.   Objective:   Weight Range: 74.3 kg Body mass index is 25.66 kg/m.   Vital Signs:   Temp:  [96.8 F (36 C)-97.8 F (36.6 C)] 97.6 F (36.4 C) (08/04 1100) Pulse Rate:  [41-71] 60 (08/04 0900) Resp:  [13-34] 16 (08/04 0900) BP: (49-133)/(36-105) 120/97 (08/04 0900) SpO2:  [90 %-100 %] 98 % (08/04 0900) FiO2 (%):  [40 %] 40 % (08/03 2100) Weight:  [74.3 kg] 74.3 kg (08/04 0500) Last BM Date :  (PTA)  Weight change: Filed Weights   05/02/24 1600 05/03/24 0500 05/04/24 0500  Weight: 72 kg 72 kg 74.3 kg    Intake/Output:   Intake/Output Summary (Last 24 hours) at 05/04/2024 1240 Last data filed at 05/04/2024 0900 Gross per 24 hour  Intake 1148.56 ml  Output 635 ml  Net 513.56 ml      Physical Exam    General:  Chronically ill appearing. Looks much older than stated age. Cor: Regular rhythm. S1, S2 Lungs: Breathing nonlabored Abdomen: Soft, nontender, nondistended.  Neuro: Awake. Appears confused.    Telemetry   Sinus bradycardia 50s-60s, 1st degree AVB and wide QRS, intermittent pacing following 2-3 second pause  Labs    CBC Recent Labs    05/02/24 1153 05/02/24 1623 05/03/24 0426 05/04/24 0829  WBC 6.0   < > 8.1 7.8  NEUTROABS 5.0  --   --   --   HGB 8.0*   < > 8.5* 8.6*  HCT 26.3*   < > 28.1* 27.9*  MCV 86.8   < > 87.0 85.8  PLT 634*   < > 627* 561*   < > = values in this interval not displayed.   Basic Metabolic Panel Recent Labs    91/96/74 0426 05/04/24 0829  NA 143 142  K 4.3 3.9  CL 113* 111  CO2 20* 21*  GLUCOSE 81 98  BUN 42* 35*  CREATININE 2.51* 2.06*  CALCIUM  9.3 9.5  MG 2.1 2.1  PHOS 5.7* 4.2   Liver Function  Tests Recent Labs    05/02/24 1153  AST 12*  ALT 7  ALKPHOS 52  BILITOT 0.5  PROT 6.8  ALBUMIN  2.0*   No results for input(s): LIPASE, AMYLASE in the last 72 hours. Cardiac Enzymes No results for input(s): CKTOTAL, CKMB, CKMBINDEX, TROPONINI in the last 72 hours.  BNP: BNP (last 3 results) No results for input(s): BNP in the last 8760 hours.  ProBNP (last 3 results) No results for input(s): PROBNP in the last 8760 hours.   D-Dimer No results for input(s): DDIMER in the last 72 hours. Hemoglobin A1C No results for input(s): HGBA1C in the last 72 hours. Fasting Lipid Panel No results for input(s): CHOL, HDL, LDLCALC, TRIG, CHOLHDL, LDLDIRECT in the last 72 hours. Thyroid  Function Tests Recent Labs    05/02/24 1230  TSH 5.717*    Other results:   Imaging    ECHOCARDIOGRAM COMPLETE Result Date: 05/03/2024    ECHOCARDIOGRAM REPORT   Patient Name:   Theodore Arellano Date of Exam: 05/03/2024 Medical Rec #:  [id number]     Height:       67.0 in Accession #:    [id number]    Weight:  158.7 lb Date of Birth:  1960/10/31     BSA:          1.833 m Patient Age:    62 years      BP:           119/79 mmHg Patient Gender: M             HR:           69 bpm. Exam Location:  Inpatient Procedure: 2D Echo, Cardiac Doppler, Color Doppler and Intracardiac            Opacification Agent (Both Spectral and Color Flow Doppler were            utilized during procedure). Indications:    I50.21 Acute systolic (congestive) heart failure  History:        Patient has prior history of Echocardiogram examinations, most                 recent 03/16/2024. CAD, ESRD and PAD, Arrythmias:Heart Block;                 Risk Factors:Hypertension, Diabetes and Dyslipidemia.  Sonographer:    Damien Senior RDCS Referring Phys: [phone] DALTON S MCLEAN IMPRESSIONS  1. Distal septal apical mid and basal inferior wall akinesis . Left ventricular ejection fraction, by estimation, is 35 to 40%. The  left ventricle has moderately decreased function. The left ventricle demonstrates regional wall motion abnormalities (see  scoring diagram/findings for description). There is moderate asymmetric left ventricular hypertrophy of the basal and septal segments. Left ventricular diastolic parameters were normal.  2. Right ventricular systolic function is normal. The right ventricular size is normal.  3. The mitral valve is abnormal. Mild to moderate mitral valve regurgitation. No evidence of mitral stenosis. Moderate mitral annular calcification.  4. The aortic valve is tricuspid. There is moderate calcification of the aortic valve. There is moderate thickening of the aortic valve. Aortic valve regurgitation is mild. Aortic valve sclerosis/calcification is present, without any evidence of aortic stenosis.  5. Aortic dilatation noted. There is moderate dilatation of the aortic root, measuring 41 mm.  6. The inferior vena cava is normal in size with greater than 50% respiratory variability, suggesting right atrial pressure of 3 mmHg. FINDINGS  Left Ventricle: Distal septal apical mid and basal inferior wall akinesis. Left ventricular ejection fraction, by estimation, is 35 to 40%. The left ventricle has moderately decreased function. The left ventricle demonstrates regional wall motion abnormalities. Definity  contrast agent was given IV to delineate the left ventricular endocardial borders. Strain was performed and the global longitudinal strain is indeterminate. The left ventricular internal cavity size was normal in size. There is moderate asymmetric left ventricular hypertrophy of the basal and septal segments. Left ventricular diastolic parameters were normal. Right Ventricle: The right ventricular size is normal. No increase in right ventricular wall thickness. Right ventricular systolic function is normal. Left Atrium: Left atrial size was normal in size. Right Atrium: Right atrial size was normal in size.  Pericardium: There is no evidence of pericardial effusion. Mitral Valve: The mitral valve is abnormal. There is moderate thickening of the mitral valve leaflet(s). There is moderate calcification of the mitral valve leaflet(s). Moderate mitral annular calcification. Mild to moderate mitral valve regurgitation. No evidence of mitral valve stenosis. Tricuspid Valve: The tricuspid valve is normal in structure. Tricuspid valve regurgitation is mild . No evidence of tricuspid stenosis. Aortic Valve: The aortic valve is tricuspid. There is moderate calcification of the aortic  valve. There is moderate thickening of the aortic valve. Aortic valve regurgitation is mild. Aortic valve sclerosis/calcification is present, without any evidence of aortic stenosis. Pulmonic Valve: The pulmonic valve was normal in structure. Pulmonic valve regurgitation is not visualized. No evidence of pulmonic stenosis. Aorta: Aortic dilatation noted. There is moderate dilatation of the aortic root, measuring 41 mm. Venous: The inferior vena cava is normal in size with greater than 50% respiratory variability, suggesting right atrial pressure of 3 mmHg. IAS/Shunts: No atrial level shunt detected by color flow Doppler. Additional Comments: 3D was performed not requiring image post processing on an independent workstation and was indeterminate.  LEFT VENTRICLE PLAX 2D LVIDd:         4.50 cm   Diastology LVIDs:         3.30 cm   LV e' medial:    5.11 cm/s LV PW:         1.10 cm   LV E/e' medial:  12.5 LV IVS:        1.60 cm   LV e' lateral:   8.70 cm/s LVOT diam:     2.40 cm   LV E/e' lateral: 7.3 LV SV:         82 LV SV Index:   45 LVOT Area:     4.52 cm  RIGHT VENTRICLE RV S prime:     6.96 cm/s TAPSE (M-mode): 1.7 cm LEFT ATRIUM             Index        RIGHT ATRIUM           Index LA diam:        3.20 cm 1.75 cm/m   RA Area:     17.10 cm LA Vol (A2C):   44.2 ml 24.11 ml/m  RA Volume:   47.60 ml  25.97 ml/m LA Vol (A4C):   29.3 ml 15.98 ml/m  LA Biplane Vol: 36.5 ml 19.91 ml/m  AORTIC VALVE LVOT Vmax:   103.00 cm/s LVOT Vmean:  78.700 cm/s LVOT VTI:    0.181 m  AORTA Ao Root diam: 4.10 cm MITRAL VALVE MV Area (PHT): 1.88 cm    SHUNTS MV Decel Time: 404 msec    Systemic VTI:  0.18 m MV E velocity: 63.90 cm/s  Systemic Diam: 2.40 cm MV A velocity: 76.00 cm/s MV E/A ratio:  0.84 Maude Emmer MD Electronically signed by Maude Emmer MD Signature Date/Time: 05/03/2024/2:55:17 PM    Final      Medications:     Scheduled Medications:  aspirin   81 mg Oral Daily   Chlorhexidine  Gluconate Cloth  6 each Topical Daily   insulin  aspart  0-15 Units Subcutaneous Q4H   leptospermum manuka honey  1 Application Topical Daily   mycophenolate   720 mg Oral BID   mouth rinse  15 mL Mouth Rinse 4 times per day   predniSONE   5 mg Oral Daily   rivaroxaban   2.5 mg Oral BID   rosuvastatin   20 mg Oral Daily   tacrolimus   3 mg Oral Daily   And   tacrolimus   2 mg Oral QHS   tamsulosin   0.4 mg Oral Daily   thiamine  (VITAMIN B1) injection  100 mg Intravenous Daily    Infusions:  dextrose  50 mL/hr at 05/04/24 0900   ertapenem  Stopped (05/03/24 2112)   vancomycin       PRN Medications: docusate sodium , mouth rinse, polyethylene glycol    Patient Profile   63 y.o. male  with history of DM II, dementia, CKD with prior renal transplant, PAD, CAD s/p CABG X 5 in 2020, ischemic cardiomyopathy tobacco/ETOH/cocaine  abuse. Admitted with sepsis 2/2 infected sacral decubitus ulcer. Course c/b bradycardia w/ intermittent heart block requiring transcutaneous pacing in setting of hypothermia and probable OSA.   Assessment/Plan   1. Bradycardia: Sinus bradycardia and intermittent high grade heart block In setting of hypothermia (and suspected sleep apnea).  With rewarming, he is now in NSR in 50s-60s.  Still requiring intermittent pacing.  - Seen by EP on admit and felt to be poor candidate for PPM d/t risk of infection (chronic wounds, immunocompromised). Would  hold off on external pacing unless he becomes symptomatic or develops hemodynamic instability. - Keep off nodal blockers.   2. CAD: Hx CABG at [facility] in 2020. HS-TnI not elevated.  - Continue ASA and add statin.  - He is on Xarelto  2.5 mg bid in setting of PAD  3. PAD: Severe PAD, s/p right axillary-profunda/popliteal bypass with thrombosis and redo.  - ASA + Xarelto .  - Continue Crestor  20 daily.   4. AKI on CKD stage 3: Baseline creatinine around 1.9, up to 2.51.   - Now improved to 2.06 - H/o renal transplant.  - Continue immunosuppressants.   5. ID: Has heel and sacral decubitus ulcers, has been on ertapenem  at SNF.  ?Sepsis syndrome as cause of hypothermia though WBCs not elevated.  - Continue ertapenem  + vanc - Per CCM.   6. Dementia: Uncertain baseline.  Has been at [facility].   7. Ischemic cardiomyopathy: Echo in 7/25 at [facility] with EF 45-50%, apical hypokinesis.   - Echo this admit: EF 35-40%, RWMA, moderate LVH, RV okay - Does not appear volume overloaded. GDMT limited by renal impairment  8. Active smoking: Recommend complete cessation  9. Goals of care: Palliative Care following for GOC discussions. He is frail with multiple comorbid conditions. - Remains full code   Length of Stay: 2  FINCH, LINDSAY N, PA-C  05/04/2024, 12:40 PM  Advanced Heart Failure Team Pager [phone] (M-F; 7a - 5p)  Please contact [facility] for night-coverage after hours (5p -7a ) and weekends on [email or web address]  Agree with above.   63 y/o male as above - permanent SNF resident, s/p renal transplant, CAD s/p CABG, CVA with severe aphasia, bedbound with sacral decubs. Admitted with presumed sepsis   Seen earlier in admit by EP for pauses. Felt not to be PPM candidate due to wounds and immunocompromise  Continues to have pauses and is getting transcutaneously paced at 30 at times  He is unable to provide me any meaningful history. Hemodynamically stable   On tele 1AVB with IVCD and  2-3 sec pauses before pacing  Echo EF 35-40%   General:  Chronically ill appearing aphasic HEENT: normal Neck: supple. JVP 607 Cor: mildly irreg Lungs: clear Abdomen: soft, nontender, nondistended. No hepatosplenomegaly. No bruits or masses. Good bowel sounds. Extremities: no cyanosis, clubbing, rash, tr edema Neuro: awake. Aphasic  He has grade conduction disease but is not a good PPM candidate due to severe infection risk.  Will take a very permissive approach. Will turn transcutaneous pacer off and tolerate any pauses that are not hemodynamically significant. If conduction disease gets worse will need to consider potential wireless pacer - though this would also carry infection risk   D/w CCM at bedside  I spent a total of 50 minutes today: 1) reviewing the patient's medical records including previous charts, labs and recent notes  from other providers; 2) examining the patient and counseling them on their medical issues/explaining the plan of care; 3) adjusting meds as needed and 4) ordering lab work or other needed tests.   Toribio Fuel, MD  1:41 PM

## 2024-05-04 NOTE — Progress Notes (Signed)
 Daily Progress Note   Patient Name: Theodore Arellano       Date: 05/04/2024 DOB: June 24, 1961  Age: 63 y.o. MRN#: [id number] Attending Physician: Gretta Leita SQUIBB, DO Primary Care Physician: [name], MD Admit Date: 05/02/2024  Reason for Consultation/Follow-up: Establishing goals of care  Subjective: I have reviewed medical records including EPIC notes, MAR, any available advanced directives as necessary, and labs. Received report from primary RN -no acute concerns.  Went to visit patient at bedside - visitor present. Notified son was in the waiting room.  Patient was lying in bed awake and alert; however, his speech is mumbled and unintelligible - he is not able to participate in complex medical discussions. No signs or non-verbal gestures of pain or discomfort noted. No respiratory distress, increased work of breathing, or secretions noted. He is frail appearing.   Met with son/Philip and his girlfriend/Heather in [facility] conference room where we were joined by patient's brother/Larry via speaker phone.  Met with family  to discuss diagnosis, prognosis, GOC, EOL wishes, disposition, and options.  I introduced Palliative Medicine as specialized medical care for people living with serious illness. It focuses on providing relief from the symptoms and stress of a serious illness. The goal is to improve quality of life for both the patient and the family.  We discussed a brief life review of the patient as well as functional and nutritional status.  Patient is not married -he had 1 son and 1 daughter; however, sadly his daughter passed away 4 months ago.  Prior to hospitalization patient was at [facility] for rehab -he had been there for 2 months.  [name] tells me that patient has  progressively declined while at rehab, he feels patient did not get good care.  Per [name], patient has been bedbound since he was discharged to rehab from [facility].  Prior to his stroke 2 months ago, patient was very independent -this has been a drastic decline from his baseline, which is why family are having a hard time.  Albumin  noted at 2.0 on 05/02/2024.  We discussed patient's current illness and what it means in the larger context of patient's on-going co-morbidities.  Family have a clear understanding of patient's current acute medical situation.  Answered questions regarding why patient is a poor candidate PPM to include high  infection risk and bleeding risk.  Natural disease trajectory and expectations at EOL were discussed. I attempted to elicit values and goals of care important to the patient. The difference between aggressive medical intervention and comfort care was considered in light of the patient's goals of care.   Hospice philosophy discussed in detail.  Comfort focused symptom management discussed in detail.  Encouraged family to consider DNR/DNI status understanding evidenced based poor outcomes in similar hospitalized patient, as the cause of arrest is likely associated with advanced chronic/terminal illness rather than an easily reversible acute cardio-pulmonary event.  I shared that even if we pursued resuscitation we would not able to resolve the underlying factors. I explained that DNR/DNI does not change the medical plan and it only comes into effect after a person has arrested (died).  It is a protective measure to keep us  from harming the patient in their last moments of life.    Family would like to take time to discuss information given to them today with each other.  They will have final decisions regarding next steps in code status tomorrow.   Family understand that at this time he would receive CPR, defibrillation, ACLS medications, and/or intubation in the event  of a cardiac/respiratory arrest.  Discussed with family the importance of continued conversation with each other and the medical providers regarding overall plan of care and treatment options, ensuring decisions are within the context of the patient's values and GOCs.    Questions and concerns were addressed. The patient/family was encouraged to call with questions and/or concerns. PMT card was provided.   Length of Stay: 2  Current Medications: Scheduled Meds:   aspirin   81 mg Oral Daily   Chlorhexidine  Gluconate Cloth  6 each Topical Daily   insulin  aspart  0-15 Units Subcutaneous Q4H   leptospermum manuka honey  1 Application Topical Daily   mycophenolate   720 mg Oral BID   mouth rinse  15 mL Mouth Rinse 4 times per day   predniSONE   5 mg Oral Daily   rivaroxaban   2.5 mg Oral BID   rosuvastatin   20 mg Oral Daily   tacrolimus   3 mg Oral Daily   And   tacrolimus   2 mg Oral QHS   tamsulosin   0.4 mg Oral Daily   thiamine  (VITAMIN B1) injection  100 mg Intravenous Daily    Continuous Infusions:  dextrose  50 mL/hr at 05/04/24 0900   ertapenem  Stopped (05/03/24 2112)   vancomycin       PRN Meds: docusate sodium , mouth rinse, polyethylene glycol  Physical Exam Constitutional:      General: He is not in acute distress.    Appearance: He is ill-appearing.  Skin:    General: Skin is warm and dry.  Neurological:     Mental Status: He is alert.             Vital Signs: BP (!) 120/97   Pulse 60   Temp 97.6 F (36.4 C) (Oral)   Resp 16   Ht 5' 7 (1.702 m)   Wt 74.3 kg   SpO2 98%   BMI 25.66 kg/m  SpO2: SpO2: 98 % O2 Device: O2 Device: Room Air O2 Flow Rate: O2 Flow Rate (L/min): 0 L/min  Intake/output summary:  Intake/Output Summary (Last 24 hours) at 05/04/2024 1323 Last data filed at 05/04/2024 0900 Gross per 24 hour  Intake 1098.49 ml  Output 635 ml  Net 463.49 ml   LBM: Last BM Date :  (PTA) Baseline  Weight: Weight: 72 kg Most recent weight: Weight: 74.3  kg       Palliative Assessment/Data: PPS 10%      Patient Active Problem List   Diagnosis Date Noted   Complete heart block (HCC) 05/03/2024   Vascular insufficiency of extremity 07/15/2023   Bradycardia 11/08/2022   HTN (hypertension) 11/08/2022   Hypomagnesemia 11/08/2022   Chronic systolic CHF (congestive heart failure) (HCC) 11/08/2022   Impaired functional mobility, balance, gait, and endurance 02/07/2021   Hypertension    Critical lower limb ischemia (HCC) 01/05/2021   Anticoagulant long-term use    PAD (peripheral artery disease) (HCC)    Leg pain, anterior, right 12/27/2020   Non-healing wound of right lower extremity 12/27/2020   Cellulitis of right lower extremity 12/27/2020   Arterial leg ulcer (HCC) 12/27/2020   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   Osteomyelitis of left fibula (HCC) 01/13/2020   Cellulitis of left lower extremity 01/11/2020   Sepsis (HCC) 01/11/2020   DM (diabetes mellitus) type II, controlled, with peripheral vascular disorder (HCC) 01/11/2020   Chronic pain syndrome 01/11/2020   Sepsis due to cellulitis (HCC) 01/11/2020   ESRD (end stage renal disease) (HCC) 01/06/2020   Acute kidney injury (HCC) 11/08/2019   COVID-19 virus infection 11/08/2019   History of DVT (deep vein thrombosis) 11/08/2019   Hx of CABG 09/04/2019   Disorder of arteries and arterioles, unspecified (HCC) 07/21/2019   Wound of left leg 07/21/2019   Left leg pain 06/28/2019   Abnormal ECG 06/05/2019   Mobitz type I Wenckebach atrioventricular block 06/05/2019   Unstable angina pectoris due to coronary arteriosclerosis (HCC) 05/30/2019   Left ventricular dysfunction 01/08/2018   Coronary artery disease    Coronary artery disease due to lipid rich plaque    Elevated troponin    Tobacco abuse 09/02/2017   Chest pain 08/27/2016   Renal transplant recipient 08/27/2016   Essential hypertension 08/27/2016   HLD (hyperlipidemia) 08/27/2016   Type 2  diabetes mellitus with vascular disease (HCC) 08/27/2016   Abnormal nuclear stress test 11/02/2015   CKD (chronic kidney disease) 11/02/2015   Current smoker 11/02/2015   Dyslipidemia 11/02/2015   Immunosuppression (HCC) 09/02/2014   Rash 09/02/2014   Left groin pain 06/24/2013   Anemia 05/21/2012   Leucopenia 05/21/2012   Personal history of immunosupression therapy 05/21/2012    Palliative Care Assessment & Plan   Patient Profile: 63 y.o. male  with past medical history of CAD, HTN, HLD, renal transplant, advanced CAD PAD, anxiety was admitted from facility on 05/02/2024 with sepsis secondary to infected decubitus ulcer on sacrum and heels, acute encephalopathy due to severe symptomatic bradycardia, intermittent complete heart block, AKI with history of renal transplant, bradycardia.    Of note, he has had 1 admission and 1 ED visit in the last 6 months.   Assessment: Principal Problem:   Sepsis (HCC) Active Problems:   Complete heart block (HCC) Concern about end-of-life  Recommendations/Plan: Continue full code/full scope at this time Family considering DNR/DNI and transition to comfort care - they wish to further discuss information given to them today prior to making final decisions Family are open for PMT follow-up tomorrow morning 8/5 for final decisions  PMT will continue to follow and support holistically  Goals of Care and Additional Recommendations: Limitations on Scope of Treatment: Full Scope Treatment  Code Status:    Code Status Orders  (From admission, onward)           Start  Ordered   05/02/24 1429  Full code  Continuous       Question:  By:  Answer:  Default: patient does not have capacity for decision making, no surrogate or prior directive available   05/02/24 1429           Code Status History     Date Active Date Inactive Code Status Order ID Comments User Context   07/16/2023 0004 07/23/2023 1853 Full Code [id number]  Silvester Ales, MD ED   11/08/2022 0020 11/09/2022 1623 Full Code [id number]  Malvina Ellen, MD ED   12/27/2020 1602 01/07/2021 1817 Full Code [id number]  Chet Mad ED   01/11/2020 2254 01/13/2020 2245 Full Code [id number]  Kenard Zachary PARAS, MD ED   11/08/2019 0201 11/11/2019 2312 Full Code [id number]  Lonzell Emeline HERO, DO ED   05/30/2019 0642 06/01/2019 1953 Full Code [id number]  Cipriano Slough, MD ED   11/28/2017 0204 12/02/2017 1914 Full Code [id number]  Luke Agent, MD ED   08/27/2016 0320 08/27/2016 2110 Full Code [id number]  Franky Redia SAILOR, MD Inpatient       Prognosis:  Unable to determine  Discharge Planning: To Be Determined  Care plan was discussed with primary RN, patient's family, Dr. Gretta  Thank you for allowing the Palliative Medicine Team to assist in the care of this patient.   Total Time 60 minutes Prolonged Time Billed  no       Jeoffrey HERO Sharps, NP  Please contact Palliative Medicine Team phone at [phone] for questions and concerns.   *Portions of this note are a verbal dictation therefore any spelling and/or grammatical errors are due to the Dragon [employer] One system interpretation.

## 2024-05-04 NOTE — Progress Notes (Signed)
 Called and informed phlebotomy team that patient is a lab draw.

## 2024-05-04 NOTE — TOC Progression Note (Signed)
 Transition of Care [facility]) - Progression Note    Patient Details  Name: Theodore Arellano MRN: [id number] Date of Birth: [birth year]  Transition of Care [facility]) CM/SW Contact  Isaiah Public, LCSWA Phone Number: 05/04/2024, 2:51 PM  Clinical Narrative:     CSW received consult for possible SNF placement at time of discharge. Due to patients current orientation CSW spoke with patients son [name] regarding PT recommendations for SNF placement for patient at time of discharge. PTA patients son reports that patient comes from [facility] short term. Patients son expressed understanding of PT recommendation and is agreeable to SNF placement for patient at time of discharge. Patients son gave CSW permission to fax out initial referral for SNF placement for patient. CSW discussed insurance authorization process.  No further questions reported at this time. CSW to continue to follow and assist with discharge planning needs.   Expected Discharge Plan: Skilled Nursing Facility Barriers to Discharge: Continued Medical Work up               Expected Discharge Plan and Services In-house Referral: Clinical Social Work Discharge Planning Services: CM Consult Post Acute Care Choice: Skilled Nursing Facility Living arrangements for the past 2 months: Skilled Nursing Facility                                       Social Drivers of Health (SDOH) Interventions SDOH Screenings   Food Insecurity: No Food Insecurity (05/02/2024)  Housing: Low Risk  (05/02/2024)  Transportation Needs: Unmet Transportation Needs (05/02/2024)  Utilities: Not At Risk (05/02/2024)  Tobacco Use: High Risk (05/03/2024)    Readmission Risk Interventions     No data to display

## 2024-05-04 NOTE — Progress Notes (Signed)
 Initial Nutrition Assessment  DOCUMENTATION CODES:   Severe malnutrition in context of chronic illness  INTERVENTION:   Per discussion with PCCM, plan to hold off on Cortrak today, GOC discussions ongoing. If po intake not adequate on follow-up, recommend insertion of Cortrak with initiation of TF if aligns with GOC  Ensure Plus High Protein po TID, each supplement provides 350 kcal and 20 grams of protein  Recommend feeding assistance with all meals  Continue IV Thamine  Pt is at high risk for refeeding; pt on D10 at 40 ml/hr currently. Phosphorus dropped to 4.2 from 5.7 in 24 hours; 26% decreased and could be related to refeeding Recommend continuing to follow electrolytes, especially phosphorus  NUTRITION DIAGNOSIS:   Severe Malnutrition related to catabolic illness as evidenced by severe fat depletion, severe muscle depletion.  GOAL:   Patient will meet greater than or equal to 90% of their needs   MONITOR:   PO intake, Supplement acceptance, Labs, Weight trends, Skin  REASON FOR ASSESSMENT:   Rounds    ASSESSMENT:   63 yo male admitted with intermittent bradycardia secondary to CHB, AKI on CKD 3, hypoglycemia with poor po intake, possible sepsis with sacral pressure injury. PMH includes CKD/ESRD with hx renal transplant, chronic non healing wounds, DM, HTN, HLD, PAD, stroke, dementia. Current tobacco smoker.  8/02 Admitted 8/04 Cortrak ordered but placement held due to diet advancement per SLP and ongoing GOC discussions  On visit today, pt lethargic but arousable, noted drool from patient's mouth, lots of secretions. +dysarthria  MBS today by SLP, now on Dysphagia 2 diet, Thin Liquids. Noted pt with difficulty sitting upright so majority of the study was conducted in pt's natural, anterior leaning posture.   Cortrak ordered but after discussion with PCCM, plan to hold on placement today and monitor po intake.   +hypoglycemia requiring D10 infusion at 40  ml/hr  Pt with chronic wounds; stage IV to sacrum, unstageable to heel.   Unable to obtain diet and wt history from patient at this time; noted pt with dementia at baseline, poor historian.  Current wt 74.3 kg, wt 72 kg yesterday.   UOP 810 mL in 24 hours  Noted hx of EtOH use but currently living in SNF.Question if still consuming EtOH at this time. Also noted hx of cocaine  use  Labs: CBGs 87-135 BUN 35 Creatinine 2.06 Phosphorus 4.2 (wdl)-decreased from 5.7 in 24 hours (>20% decrease-?related to refeeding with D10) Magnesium  2.1 (wdl)  Meds:  D10 at 40 ml/hr Prednisone  Crestor  Prograf  IV thiamine   NUTRITION - FOCUSED PHYSICAL EXAM:  Flowsheet Row Most Recent Value  Orbital Region Severe depletion  Upper Arm Region Unable to assess  Thoracic and Lumbar Region Severe depletion  Buccal Region Severe depletion  Temple Region Severe depletion  Clavicle Bone Region Severe depletion  Clavicle and Acromion Bone Region Severe depletion  Scapular Bone Region Severe depletion  Dorsal Hand Unable to assess  Patellar Region Severe depletion  Anterior Thigh Region Severe depletion  Posterior Calf Region Severe depletion  Edema (RD Assessment) Mild  Hair Reviewed  Eyes Unable to assess  Mouth Unable to assess  Skin Reviewed  Nails Reviewed    Diet Order:   Diet Order             DIET DYS 2 Room service appropriate? No; Fluid consistency: Thin  Diet effective now                   EDUCATION NEEDS:   Not  appropriate for education at this time  Skin:  Skin Assessment: Skin Integrity Issues: Skin Integrity Issues:: Stage IV, Unstageable Stage IV: sacrum Unstageable: heel  Last BM:  PTA  Height:   Ht Readings from Last 1 Encounters:  05/02/24 5' 7 (1.702 m)    Weight:   Wt Readings from Last 1 Encounters:  05/04/24 74.3 kg    BMI:  Body mass index is 25.66 kg/m.  Estimated Nutritional Needs:   Kcal:  1750-1950 kcals  Protein:  100-120  g  Fluid:  1.7 L   Betsey Finger MS, RDN, LDN, CNSC Registered Dietitian 3 Clinical Nutrition RD Inpatient Contact Info in Amion

## 2024-05-04 NOTE — Progress Notes (Signed)
 Talked w son Theodore Arellano + his sig other Theodore Arellano.  Talked about clinical situation and revisited their conversation with palliative care earlier today. That note indicates wanting to take a day to think about code status -- that is still the case  Sounds like some recent family loss, son having a hard time coming to terms with pts progressive decline as of late but seems to understand that as a reality   -f/u GOC and code status conversations tomorrow -appreciate palliative following and helping to navigate   Ronnald Gave MSN, AGACNP-BC [facility] Pulmonary/Critical Care Medicine 05/04/2024, 5:23 PM

## 2024-05-04 NOTE — Evaluation (Signed)
 Occupational Therapy Evaluation Patient Details Name: Theodore Arellano MRN: [id number] DOB: 1961/02/11 Today's Date: 05/04/2024   History of Present Illness   63 y.o. male presenting from SNF rehab with AMS and hypothermia. Became bradycardic in ED, intermittently going into complete heart block and requiring transcutaneous pacing. Ongoing workup concerning for sepsis. PMH includes HLD, HTN, CKD III, PAD, systolic HF, blindness in R eye, CABG, gout, DVT, cocaine  abuse, critical lower limb ischemia.     Clinical Impressions Pt admitted with the above from SNF rehab where he reports being for 3-4 weeks and actively working with therapy. Pt presents now with deficits in strength, cardiopulmonary endurance, sitting balance and cognition. Intermittent LLE discomfort noted as well. Per medical team recommendations, assessed EOB tasks only and deferred OOB today. Pt requiring +2 assist for bed mobility, able to hold balance EOB briefly but preferred leaning to L elbow. Based on functional assessment, pt requires Max-Total A for most ADLs d/t deficits. Patient will benefit from continued inpatient follow up therapy, <3 hours/day at DC.  HR 54-62bpm SpO2 98% on RA     If plan is discharge home, recommend the following:   A lot of help with walking and/or transfers;Two people to help with walking and/or transfers;A lot of help with bathing/dressing/bathroom;Two people to help with bathing/dressing/bathroom     Functional Status Assessment   Patient has had a recent decline in their functional status and demonstrates the ability to make significant improvements in function in a reasonable and predictable amount of time.     Equipment Recommendations   Other (comment) (TBD)     Recommendations for Other Services         Precautions/Restrictions   Precautions Precautions: Fall Precaution/Restrictions Comments: monitor HR (brady), R eye blind Restrictions Weight Bearing Restrictions  Per Provider Order: No     Mobility Bed Mobility Overal bed mobility: Needs Assistance Bed Mobility: Supine to Sit, Sit to Supine     Supine to sit: Mod assist, +2 for physical assistance, +2 for safety/equipment, HOB elevated, Used rails Sit to supine: +2 for physical assistance, +2 for safety/equipment, Max assist   General bed mobility comments: Assist/cues to partially roll/reach to bedrails with assist for BLE off of bed. Max A x 2 for LE back to bed and trunk guidance    Transfers Overall transfer level: Needs assistance                 General transfer comment: deferred OOB d/t recent pause of temporary pacing. Max A x 2 to scoot along HOB      Balance Overall balance assessment: Needs assistance Sitting-balance support: Feet supported, Single extremity supported, Bilateral upper extremity supported Sitting balance-Leahy Scale: Poor Sitting balance - Comments: able to sit statically briefly but tendency to lean onto L elbow to keep balance                                   ADL either performed or assessed with clinical judgement   ADL Overall ADL's : Needs assistance/impaired Eating/Feeding: NPO   Grooming: Minimal assistance;Sitting   Upper Body Bathing: Maximal assistance;Sitting   Lower Body Bathing: Total assistance;Bed level   Upper Body Dressing : Maximal assistance;Sitting   Lower Body Dressing: Total assistance;Bed level       Toileting- Clothing Manipulation and Hygiene: Total assistance;Bed level               Vision  Baseline Vision/History: 2 Legally blind Ability to See in Adequate Light: 1 Impaired Patient Visual Report: No change from baseline Vision Assessment?: Vision impaired- to be further tested in functional context Additional Comments: R eye blindness     Perception         Praxis         Pertinent Vitals/Pain Pain Assessment Pain Assessment: Faces Faces Pain Scale: Hurts even more Pain Location:  LLE with movement/touch Pain Descriptors / Indicators: Grimacing, Guarding Pain Intervention(s): Monitored during session, Limited activity within patient's tolerance, Repositioned     Extremity/Trunk Assessment Upper Extremity Assessment Upper Extremity Assessment: Generalized weakness;Right hand dominant   Lower Extremity Assessment Lower Extremity Assessment: Defer to PT evaluation   Cervical / Trunk Assessment Cervical / Trunk Assessment: Kyphotic   Communication Communication Communication: Impaired Factors Affecting Communication: Reduced clarity of speech   Cognition Arousal: Alert Behavior During Therapy: Flat affect Cognition: Difficult to assess Difficult to assess due to: Impaired communication           OT - Cognition Comments: speech garbled and difficult to understand, interference with difficulty clearing secretions. able to follow one step commands consistently, pleasant. decreased insight into deficits                 Following commands: Impaired Following commands impaired: Only follows one step commands consistently, Follows one step commands with increased time     Cueing  General Comments   Cueing Techniques: Verbal cues;Gestural cues;Tactile cues  Cousin at bedside and supportive   Exercises     Shoulder Instructions      Home Living Family/patient expects to be discharged to:: Skilled nursing facility                                 Additional Comments: Has been at [facility] rehab for 3-4 weeks. Prior to this, pt reports staying with son      Prior Functioning/Environment Prior Level of Function : Needs assist             Mobility Comments: In Oct 2024, ambulatory with a cane. Difficult to understand pt's speech today- reports working more on standing and difficulty with walking at SNF rehab ADLs Comments: In Oct 2024, pt was managing ADLs but had difficulty with IADLs. Currently, pt working with  therapy on ADLs at [facility] rehab    OT Problem List: Decreased strength;Decreased activity tolerance;Impaired balance (sitting and/or standing);Decreased cognition;Decreased knowledge of use of DME or AE;Cardiopulmonary status limiting activity;Pain   OT Treatment/Interventions: Self-care/ADL training;Therapeutic exercise;Energy conservation;DME and/or AE instruction;Therapeutic activities;Patient/family education;Balance training      OT Goals(Current goals can be found in the care plan section)   Acute Rehab OT Goals Patient Stated Goal: continue working with therapy OT Goal Formulation: With patient Time For Goal Achievement: 05/18/24 Potential to Achieve Goals: Fair ADL Goals Pt Will Perform Upper Body Bathing: with min assist;sitting Pt Will Transfer to Toilet: with mod assist;with +2 assist;bedside commode Additional ADL Goal #1: Pt to complete bed mobility with Min A in prep for EOB/OOB ADLs Additional ADL Goal #2: Pt to maintain sitting balance EOB during ADLs > 5 min with no more than CGA for balance   OT Frequency:  Min 2X/week    Co-evaluation PT/OT/SLP Co-Evaluation/Treatment: Yes Reason for Co-Treatment: Complexity of the patient's impairments (multi-system involvement);For patient/therapist safety PT goals addressed during session: Mobility/safety with mobility;Strengthening/ROM;Balance OT goals addressed during session: ADL's  and self-care;Strengthening/ROM      AM-PAC OT 6 Clicks Daily Activity     Outcome Measure Help from another person eating meals?: Total Help from another person taking care of personal grooming?: A Little Help from another person toileting, which includes using toliet, bedpan, or urinal?: Total Help from another person bathing (including washing, rinsing, drying)?: A Lot Help from another person to put on and taking off regular upper body clothing?: A Lot Help from another person to put on and taking off regular lower body clothing?: Total 6  Click Score: 10   End of Session Nurse Communication: Mobility status  Activity Tolerance: Patient tolerated treatment well Patient left: in bed;with call bell/phone within reach;with family/visitor present  OT Visit Diagnosis: Unsteadiness on feet (R26.81);Other abnormalities of gait and mobility (R26.89);Muscle weakness (generalized) (M62.81)                Time: 8943-8883 OT Time Calculation (min): 20 min Charges:  OT General Charges $OT Visit: 1 Visit OT Evaluation $OT Eval Moderate Complexity: 1 Mod  Mliss NOVAK, OTR/L Acute Rehab Services Office: [phone]   Mliss Fish 05/04/2024, 11:37 AM

## 2024-05-04 NOTE — NC FL2 (Signed)
 [place]  MEDICAID FL2 LEVEL OF CARE FORM     IDENTIFICATION  Patient Name: Theodore Arellano Birthdate: 1961-08-03 Sex: male Admission Date (Current Location): 05/02/2024  [facility] and [place] Number:  Producer, television/film/video and Address:  [facility] [place], [address] [place], [place] [address]      Provider Number: [id number]  Attending Physician Name and Address:  Gretta Leita SQUIBB, DO  Relative Name and Phone Number:  Ryle Buscemi (son) [phone]    Current Level of Care: Hospital Recommended Level of Care: Skilled Nursing Facility Prior Approval Number:    Date Approved/Denied:   PASRR Number: [id number]  Discharge Plan: SNF    Current Diagnoses: Patient Active Problem List   Diagnosis Date Noted   Complete heart block (HCC) 05/03/2024   Vascular insufficiency of extremity 07/15/2023   Bradycardia 11/08/2022   HTN (hypertension) 11/08/2022   Hypomagnesemia 11/08/2022   Chronic systolic CHF (congestive heart failure) (HCC) 11/08/2022   Impaired functional mobility, balance, gait, and endurance 02/07/2021   Hypertension    Critical lower limb ischemia (HCC) 01/05/2021   Anticoagulant long-term use    PAD (peripheral artery disease) (HCC)    Leg pain, anterior, right 12/27/2020   Non-healing wound of right lower extremity 12/27/2020   Cellulitis of right lower extremity 12/27/2020   Arterial leg ulcer (HCC) 12/27/2020   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   Osteomyelitis of left fibula (HCC) 01/13/2020   Cellulitis of left lower extremity 01/11/2020   Sepsis (HCC) 01/11/2020   DM (diabetes mellitus) type II, controlled, with peripheral vascular disorder (HCC) 01/11/2020   Chronic pain syndrome 01/11/2020   Sepsis due to cellulitis (HCC) 01/11/2020   ESRD (end stage renal disease) (HCC) 01/06/2020   Acute kidney injury (HCC) 11/08/2019   COVID-19 virus infection 11/08/2019   History of DVT (deep vein thrombosis) 11/08/2019    Hx of CABG 09/04/2019   Disorder of arteries and arterioles, unspecified (HCC) 07/21/2019   Wound of left leg 07/21/2019   Left leg pain 06/28/2019   Abnormal ECG 06/05/2019   Mobitz type I Wenckebach atrioventricular block 06/05/2019   Unstable angina pectoris due to coronary arteriosclerosis (HCC) 05/30/2019   Left ventricular dysfunction 01/08/2018   Coronary artery disease    Coronary artery disease due to lipid rich plaque    Elevated troponin    Tobacco abuse 09/02/2017   Chest pain 08/27/2016   Renal transplant recipient 08/27/2016   Essential hypertension 08/27/2016   HLD (hyperlipidemia) 08/27/2016   Type 2 diabetes mellitus with vascular disease (HCC) 08/27/2016   Abnormal nuclear stress test 11/02/2015   CKD (chronic kidney disease) 11/02/2015   Current smoker 11/02/2015   Dyslipidemia 11/02/2015   Immunosuppression (HCC) 09/02/2014   Rash 09/02/2014   Left groin pain 06/24/2013   Anemia 05/21/2012   Leucopenia 05/21/2012   Personal history of immunosupression therapy 05/21/2012    Orientation RESPIRATION BLADDER Height & Weight        Normal  (Urethral Catheter) Weight: 163 lb 12.8 oz (74.3 kg) Height:  5' 7 (170.2 cm)  BEHAVIORAL SYMPTOMS/MOOD NEUROLOGICAL BOWEL NUTRITION STATUS        Diet (Please see discharge summary)  AMBULATORY STATUS COMMUNICATION OF NEEDS Skin   Extensive Assist Verbally (slurred) Other (Comment) (Erythema,Coccyx,heel,Bil.,Wound/Incision LDAs,Wound PI,Buttocks,Bil.,unstageable,Wound PI Heel,L,unstageable)                       Personal Care Assistance Level of Assistance  Bathing, Feeding, Dressing Bathing  Assistance: Maximum assistance Feeding assistance: Maximum assistance Dressing Assistance: Maximum assistance     Functional Limitations Info  Sight, Hearing, Speech Sight Info: Impaired (Blind Right eye)   Speech Info:  (slurred)    SPECIAL CARE FACTORS FREQUENCY  PT (By licensed PT), OT (By licensed OT)     PT  Frequency: 5x min weekly OT Frequency: 5x min weekly            Contractures Contractures Info: Not present    Additional Factors Info  Code Status, Allergies Code Status Info: FULL Allergies Info: Percocet (oxycodone -acetaminophen ),Vicodin (hydrocodone -acetaminophen ,Shellfish Allergy,Chocolate Flavoring Agent (non-screening),Chocolate,Tomato           Current Medications (05/04/2024):  This is the current hospital active medication list Current Facility-Administered Medications  Medication Dose Route Frequency Provider Last Rate Last Admin   aspirin  chewable tablet 81 mg  81 mg Oral Daily Serena Morna SAUNDERS, RPH   81 mg at 05/04/24 9074   Chlorhexidine  Gluconate Cloth 2 % PADS 6 each  6 each Topical Daily Arloa Folks D, NP   6 each at 05/04/24 9075   dextrose  10 % infusion   Intravenous Continuous Harold Scholz, MD 50 mL/hr at 05/04/24 0900 Infusion Verify at 05/04/24 0900   docusate sodium  (COLACE) capsule 100 mg  100 mg Oral BID PRN Arloa Folks D, NP       ertapenem  (INVANZ ) 1 g in sodium chloride  0.9 % 100 mL IVPB  1 g Intravenous Q24H Cottie Donnice PARAS, MD   Stopped at 05/03/24 2112   insulin  aspart (novoLOG ) injection 0-15 Units  0-15 Units Subcutaneous Q4H Paliwal, Aditya, MD       leptospermum manuka honey (MEDIHONEY) paste 1 Application  1 Application Topical Daily Chand, Sudham, MD   1 Application at 05/04/24 9074   mycophenolate  (MYFORTIC ) EC tablet 720 mg  720 mg Oral BID Harold Scholz, MD   720 mg at 05/04/24 1327   Oral care mouth rinse  15 mL Mouth Rinse 4 times per day Chand, Sudham, MD   15 mL at 05/04/24 1200   Oral care mouth rinse  15 mL Mouth Rinse PRN Chand, Sudham, MD       polyethylene glycol (MIRALAX  / GLYCOLAX ) packet 17 g  17 g Oral Daily PRN Harris, Whitney D, NP       predniSONE  (DELTASONE ) tablet 5 mg  5 mg Oral Daily Chand, Sudham, MD   5 mg at 05/04/24 9075   rivaroxaban  (XARELTO ) tablet 2.5 mg  2.5 mg Oral BID McLean, Dalton S, MD   2.5 mg at  05/04/24 9075   rosuvastatin  (CRESTOR ) tablet 20 mg  20 mg Oral Daily McLean, Dalton S, MD   20 mg at 05/04/24 9074   tacrolimus  (PROGRAF ) capsule 3 mg  3 mg Oral Daily Chand, Sudham, MD   3 mg at 05/04/24 9075   And   tacrolimus  (PROGRAF ) capsule 2 mg  2 mg Oral QHS Chand, Sudham, MD   2 mg at 05/03/24 2129   tamsulosin  (FLOMAX ) capsule 0.4 mg  0.4 mg Oral Daily Harold Scholz, MD   0.4 mg at 05/04/24 1326   thiamine  (VITAMIN B1) injection 100 mg  100 mg Intravenous Daily Gretta Doffing P, DO   100 mg at 05/04/24 1327   vancomycin  (VANCOREADY) IVPB 1500 mg/300 mL  1,500 mg Intravenous Q48H Cottie Donnice PARAS, MD 150 mL/hr at 05/04/24 1348 1,500 mg at 05/04/24 1348     Discharge Medications: Please see discharge summary for a list of  discharge medications.  Relevant Imaging Results:  Relevant Lab Results:   Additional Information [id number]  Isaiah Public, LCSWA

## 2024-05-05 DIAGNOSIS — Z515 Encounter for palliative care: Secondary | ICD-10-CM | POA: Diagnosis not present

## 2024-05-05 DIAGNOSIS — E43 Unspecified severe protein-calorie malnutrition: Secondary | ICD-10-CM | POA: Insufficient documentation

## 2024-05-05 DIAGNOSIS — I442 Atrioventricular block, complete: Secondary | ICD-10-CM | POA: Diagnosis not present

## 2024-05-05 DIAGNOSIS — R627 Adult failure to thrive: Secondary | ICD-10-CM | POA: Diagnosis not present

## 2024-05-05 DIAGNOSIS — L89154 Pressure ulcer of sacral region, stage 4: Secondary | ICD-10-CM | POA: Diagnosis not present

## 2024-05-05 DIAGNOSIS — Z7189 Other specified counseling: Secondary | ICD-10-CM | POA: Diagnosis not present

## 2024-05-05 DIAGNOSIS — A419 Sepsis, unspecified organism: Secondary | ICD-10-CM | POA: Diagnosis not present

## 2024-05-05 DIAGNOSIS — R1310 Dysphagia, unspecified: Secondary | ICD-10-CM | POA: Diagnosis not present

## 2024-05-05 DIAGNOSIS — N179 Acute kidney failure, unspecified: Secondary | ICD-10-CM | POA: Diagnosis not present

## 2024-05-05 LAB — GLUCOSE, CAPILLARY
Glucose-Capillary: 112 mg/dL — ABNORMAL HIGH (ref 70–99)
Glucose-Capillary: 126 mg/dL — ABNORMAL HIGH (ref 70–99)
Glucose-Capillary: 83 mg/dL (ref 70–99)
Glucose-Capillary: 93 mg/dL (ref 70–99)
Glucose-Capillary: 97 mg/dL (ref 70–99)
Glucose-Capillary: 98 mg/dL (ref 70–99)

## 2024-05-05 LAB — BASIC METABOLIC PANEL WITH GFR
Anion gap: 12 (ref 5–15)
BUN: 32 mg/dL — ABNORMAL HIGH (ref 8–23)
CO2: 18 mmol/L — ABNORMAL LOW (ref 22–32)
Calcium: 9.4 mg/dL (ref 8.9–10.3)
Chloride: 110 mmol/L (ref 98–111)
Creatinine, Ser: 2.33 mg/dL — ABNORMAL HIGH (ref 0.61–1.24)
GFR, Estimated: 31 mL/min — ABNORMAL LOW (ref >60)
Glucose, Bld: 91 mg/dL (ref 70–99)
Potassium: 3.9 mmol/L (ref 3.5–5.1)
Sodium: 140 mmol/L (ref 135–145)

## 2024-05-05 LAB — PHOSPHORUS: Phosphorus: 3.7 mg/dL (ref 2.5–4.6)

## 2024-05-05 MED ORDER — LACTATED RINGERS IV BOLUS
1000.0000 mL | Freq: Once | INTRAVENOUS | Status: AC
  Administered 2024-05-05: 1000 mL via INTRAVENOUS

## 2024-05-05 MED ORDER — SODIUM CHLORIDE 0.9 % IV SOLN
2.0000 g | Freq: Two times a day (BID) | INTRAVENOUS | Status: AC
  Administered 2024-05-05 – 2024-05-08 (×7): 2 g via INTRAVENOUS
  Filled 2024-05-05 (×7): qty 40

## 2024-05-05 NOTE — Progress Notes (Signed)
 Palliative:  HPI: 63 y.o. male  with past medical history of CAD, HTN, HLD, renal transplant, advanced CAD PAD, anxiety, schizophrenia, psychosis requiring hospitalization March 2025, + cocaine  June 2025 was admitted from facility on 05/02/2024 with sepsis secondary to infected decubitus ulcer on sacrum and heels, acute encephalopathy due to severe symptomatic bradycardia, intermittent complete heart block, AKI with history of renal transplant, bradycardia. Of note, he has had 1 admission and 1 ED visit in the last 6 months.   Extensive review of notes and previous hospitalization in Care Everywhere. Noted extensive decline over the past couple months. I met today with Mr. Fosco but very difficult to understand and no family at bedside. He was able to tell me he is at [facility].   I called and coordinated meeting with son, Manus, who was with granddaughter, Harlene. We spent time reviewing Mr. Lemmerman decline and overall failure to thrive. We reviewed that his heart rate has improved and maintaining most of the time. Family voice serious concern about wounds and decline since in nursing facility. They are very concerned about extensive wounds and infection and how this has worsened at facility. (In further review I did note that deep tissue injury to sacral area and bilateral heels were documented as present on admission in June 2025 hospitalization). Family voice significant concern for care at facility and difficulty obtaining information. 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 No documented HCPOA, no spouse, and Manus is only surviving adult child. Harlene alludes to her mother having similar decline and poor care in a facility prior to her death in 2021-06-06.   We further reviewed the unfortunate circumstances that have lead to further decline. I reviewed that we have declined to a point of expectation of poor prognosis. I explained honestly that I do not  expect much improvement but high risk for further decline. Family express that they do with to continue care to prolong life and hopes of optimizing improvement. They are understanding of the limitations of medicine. We reviewed that we are making progress with treating infection and even improvement in heart rate. However, he is high risk for recurrent infection and decline. His appetite remains poor. Family ask for full scope treatment. We did address feeding tube. I explained that feeding tube would provide calories but would likely cause increased diarrhea further worsening wound with increasing infection and pain. Family agree to avoid tube feeding. They share that they are not ready to consider DNR at this time. They are open to considering the risks vs benefits and recommendations from medical team. I did encourage Manus to further consider DNR status knowing poor outcomes associated with resuscitation. I provided him with Hard Choices booklet.   All questions/concerns addressed. Emotional support provided. Discussed with Dr. Gretta and RN.   Exam: Alert, oriented to person and place. Difficult to understand his speech - family seem to understand better. No distress. Breathing regular, unlabored. HR 70s requiring infrequent pacing per RN. Abd soft. Wounds not assessed.   Plan: - Full code requested by family - No plans for tube feeding - continue careful hand feeding encouraging frequent meals - Ongoing goals of care conversations  50 min  Bernarda Kitty, NP Palliative Medicine Team Pager [phone] (Please see [email or web address] for schedule) Team Phone [phone]

## 2024-05-05 NOTE — Progress Notes (Addendum)
 Advanced Heart Failure Rounding Note  Cardiologist: Jennifer JONELLE Crape, MD   Chief Complaint: Bradycardia  Subjective:    SR with 1st degree AVB, 60s-70s. Frequent PVCs. Less pauses overnight.   Lying in bed. Unable to comprehend speech.   Objective:   Weight Range: 73.1 kg Body mass index is 25.24 kg/m.   Vital Signs:   Temp:  [93.5 F (34.2 C)-98.2 F (36.8 C)] 98.1 F (36.7 C) (08/05 0800) Pulse Rate:  [28-68] 66 (08/05 0700) Resp:  [9-29] 19 (08/05 0700) BP: (88-151)/(32-101) 106/77 (08/05 0700) SpO2:  [92 %-100 %] 95 % (08/05 0700) FiO2 (%):  [21 %] 21 % (08/05 0046) Weight:  [73.1 kg] 73.1 kg (08/05 0500) Last BM Date :  (PTA)  Weight change: Filed Weights   05/03/24 0500 05/04/24 0500 05/05/24 0500  Weight: 72 kg 74.3 kg 73.1 kg    Intake/Output:   Intake/Output Summary (Last 24 hours) at 05/05/2024 0805 Last data filed at 05/05/2024 0701 Gross per 24 hour  Intake 1398.42 ml  Output 705 ml  Net 693.42 ml      Physical Exam    General:  Appears older than stated age. Cor: Regular rate & rhythm. No murmurs. Lungs: clear anteriorly Abdomen: soft, nontender, nondistended.  Extremities: no edema Neuro: Awake. Speech incoherent.   Telemetry   SR 60s-70s, 1st degree AVB, frequent PVCs, less frequent pauses  Labs    CBC Recent Labs    05/02/24 1153 05/02/24 1623 05/03/24 0426 05/04/24 0829  WBC 6.0   < > 8.1 7.8  NEUTROABS 5.0  --   --   --   HGB 8.0*   < > 8.5* 8.6*  HCT 26.3*   < > 28.1* 27.9*  MCV 86.8   < > 87.0 85.8  PLT 634*   < > 627* 561*   < > = values in this interval not displayed.   Basic Metabolic Panel Recent Labs    91/96/74 0426 05/04/24 0829 05/05/24 0356  NA 143 142  --   K 4.3 3.9  --   CL 113* 111  --   CO2 20* 21*  --   GLUCOSE 81 98  --   BUN 42* 35*  --   CREATININE 2.51* 2.06*  --   CALCIUM  9.3 9.5  --   MG 2.1 2.1  --   PHOS 5.7* 4.2 3.7   Liver Function Tests Recent Labs    05/02/24 1153  AST  12*  ALT 7  ALKPHOS 52  BILITOT 0.5  PROT 6.8  ALBUMIN  2.0*   No results for input(s): LIPASE, AMYLASE in the last 72 hours. Cardiac Enzymes No results for input(s): CKTOTAL, CKMB, CKMBINDEX, TROPONINI in the last 72 hours.  BNP: BNP (last 3 results) No results for input(s): BNP in the last 8760 hours.  ProBNP (last 3 results) No results for input(s): PROBNP in the last 8760 hours.   D-Dimer No results for input(s): DDIMER in the last 72 hours. Hemoglobin A1C No results for input(s): HGBA1C in the last 72 hours. Fasting Lipid Panel No results for input(s): CHOL, HDL, LDLCALC, TRIG, CHOLHDL, LDLDIRECT in the last 72 hours. Thyroid  Function Tests Recent Labs    05/02/24 1230  TSH 5.717*    Other results:   Imaging    DG Swallowing Func-Speech Pathology Result Date: 05/04/2024 Table formatting from the original result was not included. Modified Barium Swallow Study Patient Details Name: Theodore Arellano MRN: [id number] Date of Birth: 1961-09-12  Today's Date: 05/04/2024 HPI/PMH: HPI: 63 y.o. male presenting from SNF rehab 8/2 with AMS and hypothermia. Became bradycardic in ED, intermittently going into complete heart block and requiring transcutaneous pacing. Ongoing workup concerning for sepsis. PMH includes HLD, HTN, CKD III, PAD, systolic HF, blindness in R eye, CABG, gout, DVT, cocaine  abuse, critical lower limb ischemia Clinical Impression: Clinical Impression: Pt exhibits mild oropharyngeal dysphagia with deficits related to efficiency more so than safety. He has difficulty sitting upright so the majority of the study was conducted in his natural, anterior-leaning posture. No penetration/aspiration occurred. Lingual pumping was noted suggesting oral inefficiency but he was attentive to mastication of solids despite upper edentulism. Mild pharyngeal residue (<50%) accumulated with purees and solids more than liquids. A liquid wash was effecitve in  clearing the majority of solid residuals. The coughing and wet vocal quality that was previously observed was not noted today. Suspect his positioning will significantly impact performance. There was distal esophageal retention of barium and the tablet noted during the sweep so recommend keeping pt sitting upright for ~30 minutes after meals. Recommend Dys 2 solids with thin liquids and meds whole in puree. Discussed with RN and will f/u. Factors that may increase risk of adverse event in presence of aspiration Noe & Lianne 2021): Factors that may increase risk of adverse event in presence of aspiration Noe & Lianne 2021): Reduced cognitive function; Limited mobility; Frail or deconditioned; Dependence for feeding and/or oral hygiene; Inadequate oral hygiene Recommendations/Plan: Swallowing Evaluation Recommendations Swallowing Evaluation Recommendations Recommendations: PO diet PO Diet Recommendation: Dysphagia 2 (Finely chopped); Thin liquids (Level 0) Liquid Administration via: Cup Medication Administration: Whole meds with puree Supervision: Full assist for feeding; Full supervision/cueing for swallowing strategies Swallowing strategies  : Minimize environmental distractions; Slow rate; Small bites/sips; Check for anterior loss; Follow solids with liquids Postural changes: Position pt fully upright for meals; Stay upright 30-60 min after meals Oral care recommendations: Oral care BID (2x/day) Treatment Plan Treatment Plan Treatment recommendations: Therapy as outlined in treatment plan below Follow-up recommendations: Skilled nursing-short term rehab (<3 hours/day) Functional status assessment: Patient has had a recent decline in their functional status and demonstrates the ability to make significant improvements in function in a reasonable and predictable amount of time. Treatment frequency: Min 2x/week Treatment duration: 2 weeks Interventions: Aspiration precaution training; Compensatory  techniques; Patient/family education; Trials of upgraded texture/liquids Recommendations Recommendations for follow up therapy are one component of a multi-disciplinary discharge planning process, led by the attending physician.  Recommendations may be updated based on patient status, additional functional criteria and insurance authorization. Assessment: Orofacial Exam: Orofacial Exam Oral Cavity: Oral Hygiene: WFL Oral Cavity - Dentition: Missing dentition (mostly intact lower anterior dentition, missing all upper dentition) Orofacial Anatomy: WFL Oral Motor/Sensory Function: WFL Anatomy: Anatomy: Suspected cervical osteophytes Boluses Administered: Boluses Administered Boluses Administered: Thin liquids (Level 0); Mildly thick liquids (Level 2, nectar thick); Moderately thick liquids (Level 3, honey thick); Puree; Solid  Oral Impairment Domain: Oral Impairment Domain Lip Closure: Escape from interlabial space or lateral juncture, no extension beyond vermillion border Tongue control during bolus hold: Not tested Bolus preparation/mastication: Timely and efficient chewing and mashing Bolus transport/lingual motion: Repetitive/disorganized tongue motion Oral residue: Trace residue lining oral structures Location of oral residue : Tongue; Palate Initiation of pharyngeal swallow : Valleculae  Pharyngeal Impairment Domain: Pharyngeal Impairment Domain Soft palate elevation: No bolus between soft palate (SP)/pharyngeal wall (PW) Laryngeal elevation: Complete superior movement of thyroid  cartilage with complete approximation of arytenoids to epiglottic  petiole Anterior hyoid excursion: Complete anterior movement Epiglottic movement: Complete inversion Laryngeal vestibule closure: Complete, no air/contrast in laryngeal vestibule Pharyngeal stripping wave : Present - diminished Pharyngeal contraction (A/P view only): N/A Pharyngoesophageal segment opening: Complete distension and complete duration, no obstruction of flow  Tongue base retraction: Trace column of contrast or air between tongue base and PPW Pharyngeal residue: Collection of residue within or on pharyngeal structures Location of pharyngeal residue: Tongue base; Valleculae; Pyriform sinuses  Esophageal Impairment Domain: Esophageal Impairment Domain Esophageal clearance upright position: Esophageal retention Pill: Pill Consistency administered: Puree Puree: WFL Penetration/Aspiration Scale Score: Penetration/Aspiration Scale Score 1.  Material does not enter airway: Thin liquids (Level 0); Mildly thick liquids (Level 2, nectar thick); Moderately thick liquids (Level 3, honey thick); Puree; Solid; Pill Compensatory Strategies: Compensatory Strategies Compensatory strategies: No   General Information: Caregiver present: Yes  Diet Prior to this Study: NPO   Temperature : Normal   Respiratory Status: WFL   Supplemental O2: None (Room air)   History of Recent Intubation: No  Behavior/Cognition: Alert; Cooperative; Requires cueing Self-Feeding Abilities: Needs assist with self-feeding Baseline vocal quality/speech: Normal Volitional Cough: Unable to elicit Volitional Swallow: Able to elicit Exam Limitations: Poor positioning Goal Planning: Prognosis for improved oropharyngeal function: Good Barriers to Reach Goals: Cognitive deficits No data recorded Patient/Family Stated Goal: none stated Consulted and agree with results and recommendations: Patient; Nurse Pain: Pain Assessment Pain Assessment: Faces Faces Pain Scale: 0 Facial Expression: 0 Body Movements: 0 Muscle Tension: 0 Compliance with ventilator (intubated pts.): N/A Vocalization (extubated pts.): 0 CPOT Total: 0 Pain Location: LLE with movement/touch Pain Descriptors / Indicators: Grimacing; Guarding Pain Intervention(s): Monitored during session End of Session: Start Time:SLP Start Time (ACUTE ONLY): 1217 Stop Time: SLP Stop Time (ACUTE ONLY): 1244 Time Calculation:SLP Time Calculation (min) (ACUTE ONLY): 27 min  Charges: SLP Evaluations $ SLP Speech Visit: 1 Visit SLP Evaluations $BSS Swallow: 1 Procedure $MBS Swallow: 1 Procedure SLP visit diagnosis: SLP Visit Diagnosis: Dysphagia, oropharyngeal phase (R13.12) Past Medical History: Past Medical History: Diagnosis Date  Abnormality of gait following cerebrovascular accident (CVA)   Anxiety   Arthritis   lower back (11/28/2017)  CAD (coronary artery disease)   Chronic lower back pain   H/O immunosuppressive therapy   chronic/notes 11/28/2017  History of gout   before kidney transport (11/28/2017)  Hyperlipidemia   Hypertension   Kidney disease   s/p kidney transplant 2011; not on dialysis now (11/28/2017)  Peripheral arterial disease (HCC)   Stroke (HCC)   Tobacco use  Past Surgical History: Past Surgical History: Procedure Laterality Date  ABDOMINAL AORTOGRAM W/LOWER EXTREMITY Bilateral 12/29/2020  Procedure: ABDOMINAL AORTOGRAM W/LOWER EXTREMITY;  Surgeon: Gretta Lonni PARAS, MD;  Location: [facility] INVASIVE CV LAB;  Service: Cardiovascular;  Laterality: Bilateral;  ARTERIOVENOUS GRAFT PLACEMENT Right 04/2007  /notes 02/01/2011  AV FISTULA PLACEMENT Left 12/20/2005; 12/2006  thelbert 02/13/2011; thelbert 02/13/2011  AXILLARY-FEMORAL BYPASS GRAFT Right 01/03/2021  Procedure: RIGHT AXILLARY TO PROFUNDA FEMORAL BYPASS;  Surgeon: Oris Krystal FALCON, MD;  Location: [facility] OR;  Service: Vascular;  Laterality: Right;  AXILLARY-FEMORAL BYPASS GRAFT Right 07/16/2023  Procedure: REDO RIGHT SARAHANN MOMENT WITH PATCH ANGIOPLSTY RIGHT POPLITEAL ARTERY.;  Surgeon: Sheree Penne Lonni, MD;  Location: [facility] OR;  Service: Vascular;  Laterality: Right;  CARDIAC CATHETERIZATION    EMBOLECTOMY  07/16/2023  Procedure: EMBOLECTOMY RIGHT Axilliofemoral and femoral/popliteal bypass graft.;  Surgeon: Sheree Penne Lonni, MD;  Location: [facility] OR;  Service: Vascular;;  FEMORAL-POPLITEAL BYPASS GRAFT Right 01/03/2021  Procedure: RIGHT  FEMORAL TO BELOW KNEE POPLITEAL ARTERY BYPASS;  Surgeon: Oris Krystal FALCON, MD;  Location: [facility] OR;  Service: Vascular;  Laterality: Right;  FEMORAL-POPLITEAL BYPASS GRAFT Right 07/19/2023  Procedure: REDO RIGHT FEMORAL-POPLITEAL ARTERY BYPASS GRAFT;  Surgeon: Sheree Penne Bruckner, MD;  Location: [facility] OR;  Service: Vascular;  Laterality: Right;  HD access procedures    INGUINAL HERNIA REPAIR Left   KIDNEY TRANSPLANT  2011  LEFT HEART CATH AND CORONARY ANGIOGRAPHY N/A 11/29/2017  Procedure: LEFT HEART CATH AND CORONARY ANGIOGRAPHY;  Surgeon: Anner Alm ORN, MD;  Location: [facility] INVASIVE CV LAB;  Service: Cardiovascular;  Laterality: N/A;  LEFT HEART CATH AND CORONARY ANGIOGRAPHY N/A 06/01/2019  Procedure: LEFT HEART CATH AND CORONARY ANGIOGRAPHY;  Surgeon: Verlin Bruckner BIRCH, MD;  Location: [facility] INVASIVE CV LAB;  Service: Cardiovascular;  Laterality: N/A;  LEFT HEART CATHETERIZATION WITH CORONARY ANGIOGRAM  03/11/2012  Procedure: LEFT HEART CATHETERIZATION WITH CORONARY ANGIOGRAM;  Surgeon: Toribio JONELLE Fuel, MD;  Location: [facility] CATH LAB;  Service: Cardiovascular;;  LOWER EXTREMITY ANGIOGRAM Right 07/19/2023  Procedure: LOWER EXTREMITY ANGIOGRAM;  Surgeon: Sheree Penne Bruckner, MD;  Location: [facility] OR;  Service: Vascular;  Laterality: Right;  PATCH ANGIOPLASTY Right 07/19/2023  Procedure: PATCH ANGIOPLASTY USING GEORGE BIOLOGIC PATCH;  Surgeon: Sheree Penne Bruckner, MD;  Location: [facility] OR;  Service: Vascular;  Laterality: Right;  RIGHT HEART CATHETERIZATION N/A 03/11/2012  Procedure: RIGHT HEART CATH;  Surgeon: Toribio JONELLE Fuel, MD;  Location: [facility] CATH LAB;  Service: Cardiovascular;  Laterality: N/A;  THROMBECTOMY Right 12/2007  thelbert 02/01/2011  THROMBECTOMY / ARTERIOVENOUS GRAFT REVISION Left 12/2006  thelbert 02/13/2011  THROMBECTOMY / ARTERIOVENOUS GRAFT REVISION Right 07/2007; 10/2007;01/2008;  thelbert 01/31/2011; thelbert 02/01/2011; thelbert 01/31/2011  THROMBECTOMY AND REVISION OF ARTERIOVENTOUS (AV) GORETEX  GRAFT  03/2007 X 2  thelbert 02/01/2011  THROMBECTOMY OF BYPASS GRAFT FEMORAL-  POPLITEAL ARTERY Right 07/19/2023  Procedure: THROMBECTOMY OF BYPASS GRAFT FEMORAL-POPLITEAL ARTERY;  Surgeon: Sheree Penne Bruckner, MD;  Location: [facility] OR;  Service: Vascular;  Laterality: Right; Damien Blumenthal, M.A., CCC-SLP Speech Language Pathology, Acute Rehabilitation Services Secure Chat preferred [phone] 05/04/2024, 1:42 PM    Medications:     Scheduled Medications:  aspirin   81 mg Oral Daily   Chlorhexidine  Gluconate Cloth  6 each Topical Daily   feeding supplement  237 mL Oral TID BM   leptospermum manuka honey  1 Application Topical Daily   mycophenolate   720 mg Oral BID   mouth rinse  15 mL Mouth Rinse 4 times per day   predniSONE   5 mg Oral Daily   rivaroxaban   2.5 mg Oral BID   rosuvastatin   20 mg Oral Daily   tacrolimus   3 mg Oral Daily   And   tacrolimus   2 mg Oral QHS   tamsulosin   0.4 mg Oral Daily   thiamine  (VITAMIN B1) injection  100 mg Intravenous Daily    Infusions:  dextrose  40 mL/hr at 05/05/24 0701   ertapenem  Stopped (05/04/24 2036)   vancomycin  Stopped (05/04/24 1550)    PRN Medications: docusate sodium , mouth rinse, polyethylene glycol    Patient Profile   63 y.o. male with history of DM II, dementia, CKD with prior renal transplant, PAD, CAD s/p CABG X 5 in 2020, ischemic cardiomyopathy tobacco/ETOH/cocaine  abuse. Admitted with sepsis 2/2 infected sacral decubitus ulcer. Course c/b bradycardia w/ intermittent heart block requiring transcutaneous pacing in setting of hypothermia and probable OSA.   Assessment/Plan   1. Bradycardia: Sinus bradycardia and intermittent high grade heart block In setting of hypothermia (  and suspected sleep apnea).   - Seen by EP on admit and felt to be poor candidate for PPM d/t risk of infection (chronic wounds, immunocompromised). Would hold off on external pacing unless he becomes symptomatic or develops hemodynamic instability. - Some improvement after rewarming and with use of BiPAP at night - Keep off  nodal blockers.   2. CAD: Hx CABG at [facility] in 2020. HS-TnI not elevated.  - Continue ASA and add statin.  - He is on Xarelto  2.5 mg bid in setting of PAD  3. PAD: Severe PAD, s/p right axillary-profunda/popliteal bypass with thrombosis and redo.  - ASA + Xarelto .  - Continue Crestor  20 daily.   4. AKI on CKD stage 3: Baseline creatinine around 1.9, up to 2.51.   - Now improved to 2.06. Awaiting AM labs - H/o renal transplant.  - Continue immunosuppressants.   5. ID: Has heel and sacral decubitus ulcers, has been on ertapenem  at SNF.  ?Sepsis syndrome as cause of hypothermia though WBCs not elevated.  - Continue ertapenem  + vanc - Per CCM.   6. Possible Dementia: Uncertain baseline.  Has been at [facility].   7. Ischemic cardiomyopathy: Echo in 7/25 at [place] with EF 45-50%, apical hypokinesis.   - Echo this admit: EF 35-40%, RWMA, moderate LVH, RV okay - Does not appear volume overloaded. GDMT limited by renal impairment  8. Active smoking: Recommend complete cessation  9. Goals of care: Palliative Care following for GOC discussions. He is frail with multiple comorbid conditions. - Remains full code   CRITICAL CARE Performed by: COLLETTA SHAVER N   Total critical care time: 14 minutes  Critical care time was exclusive of separately billable procedures and treating other patients.  Critical care was necessary to treat or prevent imminent or life-threatening deterioration.  Critical care was time spent personally by me on the following activities: development of treatment plan with patient and/or surrogate as well as nursing, discussions with consultants, evaluation of patient's response to treatment, examination of patient, obtaining history from patient or surrogate, ordering and performing treatments and interventions, ordering and review of laboratory studies, ordering and review of radiographic studies, pulse oximetry and re-evaluation of patient's condition.   Length  of Stay: 3  FINCH, LINDSAY N, PA-C  05/05/2024, 8:05 AM  Advanced Heart Failure Team Pager [phone] (M-F; 7a - 5p)  Please contact [facility] for night-coverage after hours (5p -7a ) and weekends on [email or web address]  Agree with above.   Remain aphasic and weak. No pauses overnight on tele  General:  Chronically ill appearing. No resp difficulty HEENT: normal Neck: supple. JVP 8-8thryomegaly appreciated. Cor: PMI nondisplaced. Regular rate & rhythm. No rubs, gallops or murmurs. Lungs: clear Abdomen: soft, nontender, nondistended.  Good bowel sounds. Extremities: no cyanosis, clubbing, rash, edema Neuro: aphasic moves all 4  No high grade HB or pauses overnight. Will continue to monitor in ICU for now. He has multiple contraindications for PPM placement. Will continue with permissive strategy and only consider PPM if he has hemodynamic compromise.   CRITICAL CARE Performed by: Cherrie Sieving  Total critical care time: 35 minutes  Critical care time was exclusive of separately billable procedures and treating other patients.  Critical care was necessary to treat or prevent imminent or life-threatening deterioration.  Critical care was time spent personally by me (independent of midlevel providers or residents) on the following activities: development of treatment plan with patient and/or surrogate as well as nursing, discussions with consultants, evaluation of  patient's response to treatment, examination of patient, obtaining history from patient or surrogate, ordering and performing treatments and interventions, ordering and review of laboratory studies, ordering and review of radiographic studies, pulse oximetry and re-evaluation of patient's condition.  Toribio Fuel, MD  6:58 PM

## 2024-05-05 NOTE — Progress Notes (Signed)
 NAME:  Theodore Arellano, MRN:  [id number], DOB:  [birth year], LOS: 3 ADMISSION DATE:  05/02/2024, CONSULTATION DATE:  05/02/5024 REFERRING MD:  Dr. Cottie - EDP , CHIEF COMPLAINT:  Septic shock with CHB on arrival to ED    History of Present Illness:  Theodore Arellano is a 63 y.o. male with a PMH significant for CAD, HTN, HLD, renal transplant, advanced CAD PAD, anxiety, who presented to the ED at [facility] from local SNF [facility] for altered mental status.  Per reports from SNF patient typically is alert and oriented x 3 with GCS 14 SNF reports GCS had dropped to 10 with confusion prompting EMS call.  Of note this is in the setting of treatment for sacral wound with ertapenem  at SNF.  On arrival to ED patient was seen bradycardic and mildly hypertensive.  During workup in emergency department patient became more progressively bradycardic with EKG concerning for complete heart block.  At that time patient was placed on external pacing.  Blood work significant for creatinine 2.32 with GFR 31, BUN 42, AST 12, hemoglobin 8, lactic within normal limit of 1.3, INR 1.3.  Given concern for septic shock with complete heart block and critical care consult for further management admission.  Pertinent  Medical History  CAD, HTN, HLD, PAD, anxiety, immunocompromised, s/p Ktxp   Significant Hospital Events: Including procedures, antibiotic start and stop dates in addition to other pertinent events   8/2 presented with altered mental status and workup concerning for sepsis with new onset complete heart block 8/3 cards signed off  8/4 still in and out of CHB requiring TCP, I have re-engaged cards   Interim History / Subjective:  Overnight Tmin 93.5. Less pauses when he was on bipap overnight. Had one run of NSVT.  PO intake has been poor per Rns.  Objective    Blood pressure 106/77, pulse 66, temperature 98.1 F (36.7 C), temperature source Oral, resp. rate 19, height 5' 7 (1.702 m), weight 73.1 kg, SpO2  95%.    FiO2 (%):  [21 %] 21 %   Intake/Output Summary (Last 24 hours) at 05/05/2024 0853 Last data filed at 05/05/2024 0800 Gross per 24 hour  Intake 1398.42 ml  Output 755 ml  Net 643.42 ml   Filed Weights   05/03/24 0500 05/04/24 0500 05/05/24 0500  Weight: 72 kg 74.3 kg 73.1 kg    Examination: General: chronically ill appearing man lying in bed in NAD HEENT: [place]/AT, eyes anicteric. Drooling intermittently. Neuro: Awake, alert, globally weak but moving extremities.  Pulm: breathing comfortably on RA, CTAB.  Heart: S1S2, RRR in 80s Abdomen: soft, NT  MSK: minimal muscle mass  BUN 32 Cr 2.33 Blood cultures: staph haemolitucs  Resolved problem list  Sepsis was ruled out  Assessment and Plan   Stage IV sacral decub POA- concern for sepsis due to infected ulcer Unstageable R heel ulcer POA  + Bcx, likely contaminant, staph haemolyticus -switch to meropenem  -wound care, turns  Acute encephalopathy superimposed on some degree of underlying dementia Dysphagia  -reportedly AAOx3 at nursing home, but outpt notes also speak to him being a poor historian etc  P -delirium precautions -dysphagia diet; close monitoring of PO intake due to concern for aspiration risk -con't to treat sepsis  CHB intermittently HFrEF CAD PAD  HLD  -no plans for PPM unless hemodynamically significant heart block and bradycardia; unfortunately for him a device is a significant infection risk and is not likely to change his long  term outcome in light of FTT -con't statin, low-dose xarelto , aspirin   Oliguria AKI on CKD III; improving S/p Kidney transplant on immunosuppressive therapy  -con't tacrolimus , mycophenolate , and prednisone  -1L Lr bolus due to poor PO intake -strict I/O -renally dose meds, avoid nephrotoxic meds -follow renal indices UOP   Inadequate PO intake  Hypoglycemia -con't d5w + thiamine  -enocurage PO intake with dysphagia diet  Anemia of chronic disease Thrombocytosis   -serial CBCs; transfuse for Hb <7 or hemodynamically significant bleeding  Suspected underlying OSA -nocturnal BiPAP  GOC -palliative care has been engaged- planning to meet with son today  Severe protein energy malnutrition FTT -long term prognosis guarded -encourage PO intake  Chronic indwelling foley--was POA. Not sure why he has a chronic foley- potentially for bedsore? -change foley today since not changed at admission -needs OP follow up with urology if he has retention  Best Practice (right click and Reselect all SmartList Selections daily)   Diet/type: dysphagia diet (see orders)  DVT prophylaxis DOAC Pressure ulcer(s): present on admission  GI prophylaxis: N/A Lines: N/A Foley:  N/A Code Status:  full code Last date of multidisciplinary goals of care discussion:   son was updated 8/4   Labs   CBC: Recent Labs  Lab 05/02/24 1153 05/02/24 1623 05/03/24 0426 05/04/24 0829  WBC 6.0 7.8 8.1 7.8  NEUTROABS 5.0  --   --   --   HGB 8.0* 7.9* 8.5* 8.6*  HCT 26.3* 25.5* 28.1* 27.9*  MCV 86.8 86.4 87.0 85.8  PLT 634* 638* 627* 561*    Basic Metabolic Panel: Recent Labs  Lab 05/02/24 1153 05/02/24 1623 05/03/24 0426 05/04/24 0829 05/05/24 0356  NA 144  --  143 142  --   K 4.7  --  4.3 3.9  --   CL 113*  --  113* 111  --   CO2 19*  --  20* 21*  --   GLUCOSE 82  --  81 98  --   BUN 42*  --  42* 35*  --   CREATININE 2.32* 2.45* 2.51* 2.06*  --   CALCIUM  9.3  --  9.3 9.5  --   MG  --   --  2.1 2.1  --   PHOS  --   --  5.7* 4.2 3.7   GFR: Estimated Creatinine Clearance: 34.3 mL/min (A) (by C-G formula based on SCr of 2.06 mg/dL (H)). Recent Labs  Lab 05/02/24 1153 05/02/24 1257 05/02/24 1623 05/03/24 0426 05/04/24 0829  WBC 6.0  --  7.8 8.1 7.8  LATICACIDVEN  --  1.3  --   --   --     Liver Function Tests: Recent Labs  Lab 05/02/24 1153  AST 12*  ALT 7  ALKPHOS 52  BILITOT 0.5  PROT 6.8  ALBUMIN  2.0*    ABG    Component Value  Date/Time   PHART 7.296 (L) 07/16/2023 2027   PCO2ART 41.6 07/16/2023 2027   PO2ART 261 (H) 07/16/2023 2027   HCO3 20.4 07/16/2023 2027   TCO2 22 07/16/2023 2027   ACIDBASEDEF 6.0 (H) 07/16/2023 2027   O2SAT 100 07/16/2023 2027      Leita SHAUNNA Gaskins, DO 05/05/24 8:54 AM [facility]  For contact information, see Amion. If no response to pager, please call PCCM consult pager. After hours, 7PM- 7AM, please call Elink.

## 2024-05-05 NOTE — TOC Progression Note (Signed)
 Transition of Care [facility]) - Progression Note    Patient Details  Name: Theodore Arellano MRN: [id number] Date of Birth: [birth year]  Transition of Care [facility] CM/SW Contact  Isaiah Public, LCSWA Phone Number: 05/05/2024, 4:19 PM  Clinical Narrative:     CSW plans to follow up with patients dc plan/snf placement closer to patient being medically stable for dc. CSW will continue to follow and assist with patients dc planning needs.  Expected Discharge Plan: Skilled Nursing Facility Barriers to Discharge: Continued Medical Work up               Expected Discharge Plan and Services In-house Referral: Clinical Social Work Discharge Planning Services: CM Consult Post Acute Care Choice: Skilled Nursing Facility Living arrangements for the past 2 months: Skilled Nursing Facility                                       Social Drivers of Health (SDOH) Interventions SDOH Screenings   Food Insecurity: No Food Insecurity (05/02/2024)  Housing: Low Risk  (05/02/2024)  Transportation Needs: Unmet Transportation Needs (05/02/2024)  Utilities: Not At Risk (05/02/2024)  Tobacco Use: High Risk (05/03/2024)    Readmission Risk Interventions     No data to display

## 2024-05-05 NOTE — Progress Notes (Signed)
   05/05/24 2300  BiPAP/CPAP/SIPAP  BiPAP/CPAP/SIPAP Pt Type Adult  BiPAP/CPAP/SIPAP DREAMSTATIOND  Mask Type Full face mask  Dentures removed? Not applicable  Mask Size Large  Set Rate 0 breaths/min  Respiratory Rate 14 breaths/min  IPAP 10 cmH20  EPAP 5 cmH2O  FiO2 (%) 21 %  Patient Home Machine No  Patient Home Mask No  Patient Home Tubing No  Auto Titrate No  Press High Alarm 30 cmH2O  Press Low Alarm 10 cmH2O  CPAP/SIPAP surface wiped down Yes  Device Plugged into RED Power Outlet Yes  Oxygen  Percent 21 %  BiPAP/CPAP /SiPAP Vitals  Temp (!) 96.4 F (35.8 C)  Pulse Rate 62  Resp (!) 24  BP (!) 126/93  SpO2 100 %  MEWS Score/Color  MEWS Score 2  MEWS Score Color Yellow

## 2024-05-06 DIAGNOSIS — Z515 Encounter for palliative care: Secondary | ICD-10-CM | POA: Diagnosis not present

## 2024-05-06 DIAGNOSIS — I5020 Unspecified systolic (congestive) heart failure: Secondary | ICD-10-CM

## 2024-05-06 DIAGNOSIS — N183 Chronic kidney disease, stage 3 unspecified: Secondary | ICD-10-CM

## 2024-05-06 DIAGNOSIS — L89154 Pressure ulcer of sacral region, stage 4: Secondary | ICD-10-CM | POA: Diagnosis not present

## 2024-05-06 DIAGNOSIS — R1310 Dysphagia, unspecified: Secondary | ICD-10-CM | POA: Diagnosis not present

## 2024-05-06 DIAGNOSIS — Z7189 Other specified counseling: Secondary | ICD-10-CM | POA: Diagnosis not present

## 2024-05-06 DIAGNOSIS — R627 Adult failure to thrive: Secondary | ICD-10-CM | POA: Diagnosis not present

## 2024-05-06 DIAGNOSIS — G9340 Encephalopathy, unspecified: Secondary | ICD-10-CM | POA: Diagnosis not present

## 2024-05-06 LAB — BASIC METABOLIC PANEL WITH GFR
Anion gap: 10 (ref 5–15)
BUN: 31 mg/dL — ABNORMAL HIGH (ref 8–23)
CO2: 18 mmol/L — ABNORMAL LOW (ref 22–32)
Calcium: 9.3 mg/dL (ref 8.9–10.3)
Chloride: 109 mmol/L (ref 98–111)
Creatinine, Ser: 2.17 mg/dL — ABNORMAL HIGH (ref 0.61–1.24)
GFR, Estimated: 33 mL/min — ABNORMAL LOW (ref >60)
Glucose, Bld: 99 mg/dL (ref 70–99)
Potassium: 3.8 mmol/L (ref 3.5–5.1)
Sodium: 137 mmol/L (ref 135–145)

## 2024-05-06 LAB — GLUCOSE, CAPILLARY
Glucose-Capillary: 102 mg/dL — ABNORMAL HIGH (ref 70–99)
Glucose-Capillary: 112 mg/dL — ABNORMAL HIGH (ref 70–99)
Glucose-Capillary: 265 mg/dL — ABNORMAL HIGH (ref 70–99)
Glucose-Capillary: 339 mg/dL — ABNORMAL HIGH (ref 70–99)
Glucose-Capillary: 457 mg/dL — ABNORMAL HIGH (ref 70–99)
Glucose-Capillary: 83 mg/dL (ref 70–99)
Glucose-Capillary: 89 mg/dL (ref 70–99)
Glucose-Capillary: 95 mg/dL (ref 70–99)
Glucose-Capillary: 96 mg/dL (ref 70–99)
Glucose-Capillary: 99 mg/dL (ref 70–99)

## 2024-05-06 MED ORDER — TRIMETHOBENZAMIDE HCL 100 MG/ML IM SOLN: 200.0000 mg | Freq: Three times a day (TID) | INTRAMUSCULAR | Status: DC | PRN

## 2024-05-06 MED ORDER — SENNA 8.6 MG PO TABS
1.0000 | ORAL_TABLET | Freq: Every day | ORAL | Status: DC
  Administered 2024-05-06 – 2024-05-07 (×2): 8.6 mg via ORAL
  Filled 2024-05-06 (×2): qty 1

## 2024-05-06 MED ORDER — ADULT MULTIVITAMIN W/MINERALS CH
1.0000 | ORAL_TABLET | Freq: Every day | ORAL | Status: DC
  Administered 2024-05-06 – 2024-05-09 (×4): 1 via ORAL
  Filled 2024-05-06 (×4): qty 1

## 2024-05-06 MED ORDER — POLYETHYLENE GLYCOL 3350 17 G PO PACK
17.0000 g | PACK | Freq: Two times a day (BID) | ORAL | Status: DC
  Administered 2024-05-06 – 2024-05-09 (×7): 17 g via ORAL
  Filled 2024-05-06 (×7): qty 1

## 2024-05-06 NOTE — Progress Notes (Addendum)
 Advanced Heart Failure Rounding Note  Cardiologist: Jennifer JONELLE Crape, MD   Chief Complaint: Bradycardia  Subjective:    More bradycardia yesterday evening/overnight. Episodes of 2nd degree heart block, complete heart block and pauses > 4 seconds in duration while sleeping. Transcutaneous pacing restarted.   Episode of emesis last night but was able to pull BiPAP off. Has reported some nausea.    Objective:   Weight Range: 71 kg Body mass index is 24.52 kg/m.   Vital Signs:   Temp:  [95.5 F (35.3 C)-98.4 F (36.9 C)] 97.7 F (36.5 C) (08/06 1000) Pulse Rate:  [34-77] 68 (08/06 1000) Resp:  [13-32] 19 (08/06 1000) BP: (90-147)/(39-132) 123/76 (08/06 0900) SpO2:  [94 %-100 %] 100 % (08/06 1000) FiO2 (%):  [21 %] 21 % (08/05 2300) Weight:  [71 kg] 71 kg (08/06 0139) Last BM Date :  (PTA)  Weight change: Filed Weights   05/04/24 0500 05/05/24 0500 05/06/24 0139  Weight: 74.3 kg 73.1 kg 71 kg    Intake/Output:   Intake/Output Summary (Last 24 hours) at 05/06/2024 1041 Last data filed at 05/06/2024 1000 Gross per 24 hour  Intake 2045.83 ml  Output 670 ml  Net 1375.83 ml      Physical Exam    General: Lethargic, chronically ill appearing man. Looks much older than stated age. Cor: Irregular rhythm. No murmurs. Lungs: clear anteriorly Extremities: no edema Neuro: Will arouse and give short responses to some questions. Significant dysarthria.  Labs    CBC Recent Labs    05/04/24 0829  WBC 7.8  HGB 8.6*  HCT 27.9*  MCV 85.8  PLT 561*   Basic Metabolic Panel Recent Labs    91/95/74 0829 05/05/24 0356 05/05/24 1042  NA 142  --  140  K 3.9  --  3.9  CL 111  --  110  CO2 21*  --  18*  GLUCOSE 98  --  91  BUN 35*  --  32*  CREATININE 2.06*  --  2.33*  CALCIUM  9.5  --  9.4  MG 2.1  --   --   PHOS 4.2 3.7  --    Liver Function Tests No results for input(s): AST, ALT, ALKPHOS, BILITOT, PROT, ALBUMIN  in the last 72 hours.  No results  for input(s): LIPASE, AMYLASE in the last 72 hours. Cardiac Enzymes No results for input(s): CKTOTAL, CKMB, CKMBINDEX, TROPONINI in the last 72 hours.  BNP: BNP (last 3 results) No results for input(s): BNP in the last 8760 hours.  ProBNP (last 3 results) No results for input(s): PROBNP in the last 8760 hours.   D-Dimer No results for input(s): DDIMER in the last 72 hours. Hemoglobin A1C No results for input(s): HGBA1C in the last 72 hours. Fasting Lipid Panel No results for input(s): CHOL, HDL, LDLCALC, TRIG, CHOLHDL, LDLDIRECT in the last 72 hours. Thyroid  Function Tests No results for input(s): TSH, T4TOTAL, T3FREE, THYROIDAB in the last 72 hours.  Invalid input(s): FREET3   Other results:   Imaging    No results found.    Medications:     Scheduled Medications:  aspirin   81 mg Oral Daily   Chlorhexidine  Gluconate Cloth  6 each Topical Daily   feeding supplement  237 mL Oral TID BM   leptospermum manuka honey  1 Application Topical Daily   multivitamin with minerals  1 tablet Oral Daily   mycophenolate   720 mg Oral BID   mouth rinse  15 mL Mouth Rinse  4 times per day   polyethylene glycol  17 g Oral BID   predniSONE   5 mg Oral Daily   rivaroxaban   2.5 mg Oral BID   rosuvastatin   20 mg Oral Daily   senna  1 tablet Oral Daily   tacrolimus   3 mg Oral Daily   And   tacrolimus   2 mg Oral QHS   tamsulosin   0.4 mg Oral Daily   thiamine  (VITAMIN B1) injection  100 mg Intravenous Daily    Infusions:  dextrose  40 mL/hr at 05/06/24 1000   meropenem  (MERREM ) IV 2 g (05/06/24 1004)   vancomycin  Stopped (05/04/24 1550)    PRN Medications: docusate sodium , mouth rinse, trimethobenzamide     Patient Profile   63 y.o. male with history of DM II, dementia, CKD with prior renal transplant, PAD, CAD s/p CABG X 5 in 2020, ischemic cardiomyopathy tobacco/ETOH/cocaine  abuse. Admitted with sepsis 2/2 infected sacral decubitus  ulcer. Course c/b bradycardia w/ intermittent heart block requiring transcutaneous pacing in setting of hypothermia and probable OSA.   Assessment/Plan   1. Bradycardia: Sinus bradycardia and intermittent high grade heart block In setting of hypothermia (and suspected sleep apnea).   - Transcutaneous pacing had been stopped a couple of days ago, resumed yesterday evening d/t long pauses. Will discontinue pacing again today. He is not a candidate for TVP or PPM d/t chronic wounds (unstageable heel wound and large stage IV sacral decubitus ulcer) and immunocompromised. Will ask EP to weigh in regarding leadless pacemaker, but doubt this would be an option.  2. CAD: Hx CABG at [facility] in 2020. HS-TnI not elevated.  - Continue ASA and add statin.  - He is on Xarelto  2.5 mg bid in setting of PAD  3. PAD: Severe PAD, s/p right axillary-profunda/popliteal bypass with thrombosis and redo.  - ASA + Xarelto .  - Continue Crestor  20 daily.   4. AKI on CKD stage 3: Baseline creatinine around 1.9, up to 2.51.   - Now 2.3 - H/o renal transplant.  - Continue immunosuppressants.   5. ID: Has heel and sacral decubitus ulcers, has been on ertapenem  at SNF.  ?Sepsis syndrome as cause of hypothermia though WBCs not elevated. BC with contaminant - Continue merrem  + vanc - Per CCM.   6. Neuro: Mention of dementia and recent stroke. Uncertain baseline.  - Significant dysarthria noted  7. Ischemic cardiomyopathy: Echo in 7/25 at [facility] with EF 45-50%, apical hypokinesis.   - Echo this admit: EF 35-40%, RWMA, moderate LVH, RV okay - Does not appear volume overloaded. GDMT limited by renal impairment  8. Active smoking: Recommend complete cessation  9. Goals of care: Palliative Care following for GOC discussions. He is frail with multiple comorbid conditions. - Remains full code   CRITICAL CARE Performed by: COLLETTA SHAVER N   Total critical care time: 15 minutes  Critical care time was  exclusive of separately billable procedures and treating other patients.  Critical care was necessary to treat or prevent imminent or life-threatening deterioration.  Critical care was time spent personally by me on the following activities: development of treatment plan with patient and/or surrogate as well as nursing, discussions with consultants, evaluation of patient's response to treatment, examination of patient, obtaining history from patient or surrogate, ordering and performing treatments and interventions, ordering and review of laboratory studies, ordering and review of radiographic studies, pulse oximetry and re-evaluation of patient's condition.   Length of Stay: 4  FINCH, LINDSAY N, PA-C  05/06/2024, 10:41 AM  Advanced Heart Failure Team Pager [phone] (M-F; 7a - 5p)  Please contact [facility] for night-coverage after hours (5p -7a ) and weekends on [email or web address]  Agree with above. He is awake and will converse but I am unable to understand him due to severe aphasia. He does not seem uncomfortable.   Transcutaneous pads placed on him last night for bradycardia. Tele shows 4.5 sec pause prior to pacing.   General:  Chronically ill appearing. No resp difficulty HEENT: normal Neck: supple. JVP 7   Cor irreg brady Lungs: clear Abdomen: soft, nontender, nondistended. No hepatosplenomegaly. No bruits or masses. Good bowel sounds. Extremities: no cyanosis, clubbing, rash, edema Neuro: alert aphasic. Moves all 4   Given bedbound state, multiple co-morbidities and very large stage IV sacral decubitus he is absolutely not a candidate for a transvenous pacing. The question arises if he is candidate for wireless device. Given his current condition I think risk of procedure and device implant likely outweighs benefit in terms of quality and quantity of life.   As he is bedbound he will need significant bradycardia/pauses to become hemodynamically symptomatic. Would take TCP pads off and  only reattempt pacing if he is hemodynamically unstable in setting of a long pause > 7-8secs.   EP to see tomorrow to make definite decision on wireless device but for now I do not think he is a candidate for the device.   D/w CCM  CRITICAL CARE Performed by: Cherrie Sieving  Total critical care time: 55 minutes  Critical care time was exclusive of separately billable procedures and treating other patients.  Critical care was necessary to treat or prevent imminent or life-threatening deterioration.  Critical care was time spent personally by me (independent of midlevel providers or residents) on the following activities: development of treatment plan with patient and/or surrogate as well as nursing, discussions with consultants, evaluation of patient's response to treatment, examination of patient, obtaining history from patient or surrogate, ordering and performing treatments and interventions, ordering and review of laboratory studies, ordering and review of radiographic studies, pulse oximetry and re-evaluation of patient's condition.  Sieving Cherrie, MD  4:58 PM

## 2024-05-06 NOTE — Progress Notes (Incomplete)
 Palliative:  ***  Yong Channel, NP Palliative Medicine Team Pager [phone] (Please see [email or web address] for schedule) Team Phone [phone]

## 2024-05-06 NOTE — Progress Notes (Signed)
 Nutrition Follow-up  DOCUMENTATION CODES:   Severe malnutrition in context of chronic illness  INTERVENTION:  Continue Ensure Plus High Protein po TID, each supplement provides 350 kcal and 20 grams of protein Magic cup BID with meals, each supplement provides 290 kcal and 9 grams of protein Feeding assistance with all meals  Multivitamin w/ minerals daily  NUTRITION DIAGNOSIS:  Severe Malnutrition related to catabolic illness as evidenced by severe fat depletion, severe muscle depletion. - Ongoing  GOAL:  Patient will meet greater than or equal to 90% of their needs - Ongoing  MONITOR:  PO intake, Supplement acceptance, Labs, Skin  REASON FOR ASSESSMENT:  Rounds   ASSESSMENT:  63 yo male admitted with intermittent bradycardia secondary to CHB, AKI on CKD 3, hypoglycemia with poor po intake, possible sepsis with sacral pressure injury. PMH includes CKD/ESRD with hx renal transplant, chronic non healing wounds, DM, HTN, HLD, PAD, stroke, dementia. Current tobacco smoker.  8/02 - Admitted 8/04 - Cortrak ordered but placement held due to diet advancement per SLP and ongoing GOC discussions; diet advanced to Dysphagia 2   Palliative care meeting on 8/05, per note no plan for feeding tube at this time.   Pt sitting up in bed, eating breakfast at time of RD visit. RN at bedside, reports that pt had an episode of emesis last night. Still no BM. Pt is drinking the Ensures.   Meal Intakes 8/05: 10% breakfast, 15% lunch, 10% dinner  Admission weight: 72 kg Current weight: 71 kg   Nutrition Related Medications: Miralax , Prednisone , Senna, IV antibiotics  Drips D10 at 40 mL/hr Labs: reviewed  CBG: 83-112 mg/dL x 24 hrs   UOP: 364 mL x 24 hrs   Diet Order:   Diet Order             DIET DYS 2 Room service appropriate? No; Fluid consistency: Thin  Diet effective now                  EDUCATION NEEDS:  Not appropriate for education at this time  Skin:  Skin Assessment:  Skin Integrity Issues: Skin Integrity Issues:: Stage IV, Unstageable Stage IV: sacrum Unstageable: heel  Last BM:  Unknown/PTA  Height:  Ht Readings from Last 1 Encounters:  05/02/24 5' 7 (1.702 m)   Weight:  Wt Readings from Last 1 Encounters:  05/06/24 71 kg   Ideal Body Weight:  67.3 kg  BMI:  Body mass index is 24.52 kg/m.  Estimated Nutritional Needs:  Kcal:  1750-1950 kcals Protein:  100-120 g Fluid:  1.7 L   Nestora Glatter RD, LDN Clinical Dietitian

## 2024-05-06 NOTE — Progress Notes (Signed)
 Speech Language Pathology Treatment: Dysphagia  Patient Details Name: Theodore Arellano MRN: [id number] DOB: 20-Nov-1960 Today's Date: 05/06/2024 Time: 8880-8871 SLP Time Calculation (min) (ACUTE ONLY): 9 min  Assessment / Plan / Recommendation Clinical Impression  Pt seen for ongoing dysphagia management.  RN reports poor PO intake, but no overt s/s of dysphagia.  Midday meal tray at bedside. Pt accepted 3-4 bites of puree and chopped/ground consistency.  Provided thin liquid wash following solids.  There was a single short coughing epoch noted which was seemingly consistent with baseline cough.  Pt with significantly prolonged oral phase with ground/minced texture than with puree.  There was some oral residue with this texture too.  Pt appears to be tolerating current diet fairly, but does not appear to have potential to advance diet at this time 2/2 mentation, although pt was able to consume and easy to chew diet in June.  Pt kept eyes closed throughout majority of session with low volume mumbling - decreased intelligibility.  Pt declined further POs (I don't want none of that).  Pt seemingly stable with diet at this time and SLP will sign off.  Should pt's mentation improve and pt would like to advance diet texture please reconsult speech therapy. If pt exhibits decreased tolerance of current diet, please make pt NPO and place new orders for swallowing assessment.  Recommend continuing chopped/ground (dys2)// minced and moist diet with thin liquids with precautions as noted below.    HPI HPI: 63 y.o. male presenting from SNF rehab 8/2 with AMS and hypothermia. Became bradycardic in ED, intermittently going into complete heart block and requiring transcutaneous pacing. Ongoing workup concerning for sepsis. PMH includes HLD, HTN, CKD III, PAD, systolic HF, blindness in R eye, CABG, gout, DVT, cocaine  abuse, critical lower limb ischemia      SLP Plan  All goals met;Discharge SLP treatment due to  (comment)          Recommendations  Diet recommendations: Dysphagia 2 (fine chop);Thin liquid Liquids provided via: Cup;Straw Medication Administration: Whole meds with puree (crush if needed) Supervision: Trained caregiver to feed patient Compensations:  Slow rate; Small sips/bites; Follow solids with liquid; Monitor for anterior loss; Minimize environmental distractions Postural Changes and/or Swallow Maneuvers: Seated upright 90 degrees;Upright 30-60 min after meal                  Oral care BID   None Dysphagia, oropharyngeal phase (R13.12)     All goals met;Discharge SLP treatment due to (comment)     Anette FORBES Grippe, MA, CCC-SLP Acute Rehabilitation Services Office: [phone] 05/06/2024, 11:54 AM

## 2024-05-06 NOTE — Progress Notes (Signed)
 NAME:  Theodore Arellano, MRN:  [id number], DOB:  26-Feb-1961, LOS: 4 ADMISSION DATE:  05/02/2024, CONSULTATION DATE:  05/02/5024 REFERRING MD:  Dr. Cottie - EDP , CHIEF COMPLAINT:  Septic shock with CHB on arrival to ED    History of Present Illness:  Theodore Arellano is a 63 y.o. male with a PMH significant for CAD, HTN, HLD, renal transplant, advanced CAD PAD, anxiety, who presented to the ED at [facility] from local SNF [facility] for altered mental status.  Per reports from SNF patient typically is alert and oriented x 3 with GCS 14 SNF reports GCS had dropped to 10 with confusion prompting EMS call.  Of note this is in the setting of treatment for sacral wound with ertapenem  at SNF.  On arrival to ED patient was seen bradycardic and mildly hypertensive.  During workup in emergency department patient became more progressively bradycardic with EKG concerning for complete heart block.  At that time patient was placed on external pacing.  Blood work significant for creatinine 2.32 with GFR 31, BUN 42, AST 12, hemoglobin 8, lactic within normal limit of 1.3, INR 1.3.  Given concern for septic shock with complete heart block and critical care consult for further management admission.  Pertinent  Medical History  CAD, HTN, HLD, PAD, anxiety, immunocompromised, s/p Ktxp   Significant Hospital Events: Including procedures, antibiotic start and stop dates in addition to other pertinent events   8/2 presented with altered mental status and workup concerning for sepsis with new onset complete heart block 8/3 cards signed off  8/4 still in and out of CHB requiring TCP, I have re-engaged cards   Interim History / Subjective:  Had a long pause yesterday afternoon while sleeping-- per RNs was difficult to arouse after this.  Vomited last night-- was able to tell nurses to take off bipap.   Objective    Blood pressure (!) 90/54, pulse (!) 40, temperature (!) 96.8 F (36 C), resp. rate 16, height 5' 7 (1.702  m), weight 71 kg, SpO2 99%.    FiO2 (%):  [21 %] 21 %   Intake/Output Summary (Last 24 hours) at 05/06/2024 0731 Last data filed at 05/06/2024 9394 Gross per 24 hour  Intake 1990.45 ml  Output 635 ml  Net 1355.45 ml   Filed Weights   05/04/24 0500 05/05/24 0500 05/06/24 0139  Weight: 74.3 kg 73.1 kg 71 kg    Examination: General: chronically ill appearing man lying in bed in NAD, lethargic- similar to previous exams. HEENT: [place]/AT, eyes anicteric Neuro: lethargic but arouses to verbal stimulation, answering some questions. Speech still difficult to understand due to dysarthria.   Pulm: breathing comfortably on RA, CTAB Heart: S1S2, bradycardic, sinus rhythm on tele.  Abdomen: soft, NT MSK: minimal muscle mass, no peripheral edema  Blood cultures: staph haemolitucs 1  BMP ordered  Resolved problem list  Sepsis was ruled out  Assessment and Plan   Stage IV sacral decub POA- concern infected ulcer Unstageable R heel ulcer POA  + Bcx, likely contaminant, staph haemolyticus -con't meropenem  for ulcer -con't wound care, frequent turns, trying to optimize nutrition  Acute encephalopathy superimposed on some degree of underlying dementia Dysphagia  -reportedly AAOx3 at nursing home, but outpt notes also speak to him being a poor historian etc  P -con't delirium precautions, avoid deliriogenic meds -dysphagia diet  CHB intermittently HFrEF CAD PAD  HLD  -high risk for PPM with infection risk and not convinced it would change  his long-term outcome. When his Hrs recover to normal he does not have different mentation and bradycardia episodes not associated with drops in BP. -con't statin, aspirin , low dose xarelto  -tele monitoring -has pacing pads on   Oliguria AKI on CKD III; improving S/p Kidney transplant on immunosuppressive therapy  -con't mycophenolate , pred, tacro -strict I/O -renally dose meds, avoid nephrotoxic meds -BMP pending today  Inadequate PO intake   Hypoglycemia -ccon't thiamine  with D10w gtt -dysphagia diet, RN encouraging PO intake -Ensure  Anemia of chronic disease Thrombocytosis  -transfuse for Hb <7 or hemodynamically significant bleeding  Concern for OSA; high risk for central sleep apnea -bipap at bedtime; had to hold last night due to nausea and vomiting  GOC -Palliative conversations ongoing  Severe protein energy malnutrition FTT -long term prognosis guarded with malnutrition, debility/ immobility  Constipation -bowel regimen  Chronic indwelling foley--was POA. Not sure why he has a chronic foley- potentially for bedsore? -changed foley 8/5 -needs OP follow up with urology if he has retention  Best Practice (right click and Reselect all SmartList Selections daily)   Diet/type: dysphagia diet (see orders)  DVT prophylaxis DOAC Pressure ulcer(s): present on admission  GI prophylaxis: N/A Lines: N/A Foley:  N/A Code Status:  full code Last date of multidisciplinary goals of care discussion:   son met with [facility] on 8/5    Labs   CBC: Recent Labs  Lab 05/02/24 1153 05/02/24 1623 05/03/24 0426 05/04/24 0829  WBC 6.0 7.8 8.1 7.8  NEUTROABS 5.0  --   --   --   HGB 8.0* 7.9* 8.5* 8.6*  HCT 26.3* 25.5* 28.1* 27.9*  MCV 86.8 86.4 87.0 85.8  PLT 634* 638* 627* 561*    Basic Metabolic Panel: Recent Labs  Lab 05/02/24 1153 05/02/24 1623 05/03/24 0426 05/04/24 0829 05/05/24 0356 05/05/24 1042  NA 144  --  143 142  --  140  K 4.7  --  4.3 3.9  --  3.9  CL 113*  --  113* 111  --  110  CO2 19*  --  20* 21*  --  18*  GLUCOSE 82  --  81 98  --  91  BUN 42*  --  42* 35*  --  32*  CREATININE 2.32* 2.45* 2.51* 2.06*  --  2.33*  CALCIUM  9.3  --  9.3 9.5  --  9.4  MG  --   --  2.1 2.1  --   --   PHOS  --   --  5.7* 4.2 3.7  --    GFR: Estimated Creatinine Clearance: 30.3 mL/min (A) (by C-G formula based on SCr of 2.33 mg/dL (H)). Recent Labs  Lab 05/02/24 1153 05/02/24 1257 05/02/24 1623  05/03/24 0426 05/04/24 0829  WBC 6.0  --  7.8 8.1 7.8  LATICACIDVEN  --  1.3  --   --   --     Liver Function Tests: Recent Labs  Lab 05/02/24 1153  AST 12*  ALT 7  ALKPHOS 52  BILITOT 0.5  PROT 6.8  ALBUMIN  2.0*    ABG    Component Value Date/Time   PHART 7.296 (L) 07/16/2023 2027   PCO2ART 41.6 07/16/2023 2027   PO2ART 261 (H) 07/16/2023 2027   HCO3 20.4 07/16/2023 2027   TCO2 22 07/16/2023 2027   ACIDBASEDEF 6.0 (H) 07/16/2023 2027   O2SAT 100 07/16/2023 2027      This patient is critically ill with multiple organ system failure which requires frequent  high complexity decision making, assessment, support, evaluation, and titration of therapies. This was completed through the application of advanced monitoring technologies and extensive interpretation of multiple databases. During this encounter critical care time was devoted to patient care services described in this note for 35 minutes.   Leita SHAUNNA Gaskins, DO 05/06/24 8:34 AM [facility]  For contact information, see Amion. If no response to pager, please call PCCM consult pager. After hours, 7PM- 7AM, please call Elink.

## 2024-05-07 DIAGNOSIS — E43 Unspecified severe protein-calorie malnutrition: Secondary | ICD-10-CM

## 2024-05-07 DIAGNOSIS — R001 Bradycardia, unspecified: Secondary | ICD-10-CM | POA: Diagnosis not present

## 2024-05-07 DIAGNOSIS — Z7189 Other specified counseling: Secondary | ICD-10-CM | POA: Diagnosis not present

## 2024-05-07 DIAGNOSIS — L89154 Pressure ulcer of sacral region, stage 4: Secondary | ICD-10-CM | POA: Diagnosis not present

## 2024-05-07 DIAGNOSIS — R339 Retention of urine, unspecified: Secondary | ICD-10-CM

## 2024-05-07 DIAGNOSIS — R627 Adult failure to thrive: Secondary | ICD-10-CM | POA: Diagnosis not present

## 2024-05-07 DIAGNOSIS — I442 Atrioventricular block, complete: Secondary | ICD-10-CM

## 2024-05-07 DIAGNOSIS — Z515 Encounter for palliative care: Secondary | ICD-10-CM | POA: Diagnosis not present

## 2024-05-07 LAB — BASIC METABOLIC PANEL WITH GFR
Anion gap: 12 (ref 5–15)
BUN: 28 mg/dL — ABNORMAL HIGH (ref 8–23)
CO2: 19 mmol/L — ABNORMAL LOW (ref 22–32)
Calcium: 9.4 mg/dL (ref 8.9–10.3)
Chloride: 106 mmol/L (ref 98–111)
Creatinine, Ser: 2.08 mg/dL — ABNORMAL HIGH (ref 0.61–1.24)
GFR, Estimated: 35 mL/min — ABNORMAL LOW (ref >60)
Glucose, Bld: 90 mg/dL (ref 70–99)
Potassium: 4.1 mmol/L (ref 3.5–5.1)
Sodium: 137 mmol/L (ref 135–145)

## 2024-05-07 LAB — GLUCOSE, CAPILLARY
Glucose-Capillary: 103 mg/dL — ABNORMAL HIGH (ref 70–99)
Glucose-Capillary: 76 mg/dL (ref 70–99)
Glucose-Capillary: 89 mg/dL (ref 70–99)
Glucose-Capillary: 90 mg/dL (ref 70–99)
Glucose-Capillary: 91 mg/dL (ref 70–99)
Glucose-Capillary: 94 mg/dL (ref 70–99)

## 2024-05-07 LAB — CULTURE, BLOOD (ROUTINE X 2)
Culture: NO GROWTH
Special Requests: ADEQUATE

## 2024-05-07 MED ORDER — SENNA 8.6 MG PO TABS
2.0000 | ORAL_TABLET | Freq: Every day | ORAL | Status: DC
  Administered 2024-05-08 – 2024-05-09 (×2): 17.2 mg via ORAL
  Filled 2024-05-07 (×2): qty 2

## 2024-05-07 MED ORDER — BISACODYL 5 MG PO TBEC
10.0000 mg | DELAYED_RELEASE_TABLET | Freq: Every day | ORAL | Status: DC
  Administered 2024-05-07: 10 mg via ORAL
  Filled 2024-05-07 (×2): qty 2

## 2024-05-07 MED ORDER — SODIUM CHLORIDE 0.9% FLUSH
3.0000 mL | Freq: Two times a day (BID) | INTRAVENOUS | Status: DC
  Administered 2024-05-07 – 2024-05-09 (×5): 3 mL via INTRAVENOUS

## 2024-05-07 NOTE — Progress Notes (Cosign Needed)
 Patient Name: Theodore Arellano Date of Encounter: 05/07/2024  Primary Cardiologist: Jennifer JONELLE Crape, MD Electrophysiologist: None  Interval Summary   No acute distress. Continues to have intermittent 2nd degree and CHB, especially at night  Vital Signs    Vitals:   05/07/24 0540 05/07/24 0606 05/07/24 0630 05/07/24 0700  BP: 117/68 128/82 132/76 114/80  Pulse: 67 (!) 55 61 61  Resp: 15 20 13  (!) 22  Temp: 98.2 F (36.8 C) 98.6 F (37 C) 98.4 F (36.9 C) 98.4 F (36.9 C)  TempSrc:      SpO2: 96% 96% 95% 98%  Weight:      Height:        Intake/Output Summary (Last 24 hours) at 05/07/2024 0752 Last data filed at 05/07/2024 0700 Gross per 24 hour  Intake 1765.47 ml  Output 1170 ml  Net 595.47 ml   Filed Weights   05/05/24 0500 05/06/24 0139 05/07/24 0500  Weight: 73.1 kg 71 kg 70.2 kg    Physical Exam    GEN- Lethargic, chronically ill appearing.  Lungs- Clear anteriorly. BiPAP on currently.  Cardiac- Irregular due to skipped beats Extremities- No edema  Telemetry    SB/NSR 40-60s with PVCs and intermittent Second Degree and CHB, mostly nocturnal. (personally reviewed)  Hospital Course    63 y.o. male with history of DM II, dementia, CKD with prior renal transplant, PAD, CAD s/p CABG X 5 in 2020, ischemic cardiomyopathy tobacco/ETOH/cocaine  abuse. Admitted with sepsis 2/2 infected sacral decubitus ulcer. Course c/b bradycardia w/ intermittent heart block requiring transcutaneous pacing in setting of hypothermia and probable OSA.   Assessment & Plan    Advanced AV Block Due to his significant co-morbidities he is not a candidate for any invasive EP procedures. Even if he were a candidate, pacing is unlikely to change his trajectory or quality of life.   We would recommend conservative symptom management and continuing Goals of Care discussions.   Sepsis Severe PAD CAD s/p CABG AKD on CKD III History of CVA Dysarthria Per primary.    For questions or  updates, please contact [facility] Please consult [email or web address] for contact info under     Signed, Ozell Prentice Passey, PA-C  05/07/2024, 7:52 AM     I have seen, examined the patient, and reviewed the above assessment and plan.    Interval:    General: Well developed, in no acute distress.  Neck: No JVD.  Cardiac: Normal rate, regular rhythm.  Resp: Normal work of breathing.  Ext: No edema.  Neuro: No gross focal deficits.  Psych: Normal affect.   Assessment: Theodore Arellano is a 63 y.o.  chronically ill male with a PMH significant for CAD, HTN, HLD, renal transplant, advanced CAD PAD, anxiety, dementia, chronic sacral decubitus ulcers who presented to the ED at [facility] from local SNF [facility] for altered mental status.  He has baseline confusion but became more somnolent today.  Upon arrival to the ED he was noted to be bradycardic with intermittent heart block at times requiring external pacing.  He was found to be profoundly hypothermic in setting of likely sepsis.  His AV conduction heart rates have improved with warming.  When awake, he conducts 1:1 with long PR interval. He does have some bradycardia and AV block during sleep. No known symptomatic or hemodynamically unstable bradycardia. Given his co-morbidities and infection risk (chronic non-healing, open sacral decubitus ulcers), risks of pacemaker implant are high. If pacemaker was needed, then leadless would have  to be pursued and, given his frailty, would be high operative risk. Discussed this with patient and family. Family voiced understanding and would like to avoid high risk interventions. Palliative care has been consulted.    #Bradycardia: in setting of hypothermia and during sleep #AV block: in setting of hypothermia and during sleep   Plan:  - No plans for invasive EP procedures. Agree with Palliative Care consultation.  Plan:    Fonda Kitty, MD 05/08/2024 10:23 AM

## 2024-05-07 NOTE — IPAL (Signed)
  Interdisciplinary Goals of Care Family Meeting   Date carried out: 05/07/2024  Location of the meeting: Bedside  Member's involved: Physician, Bedside Registered Nurse, and Family Member or next of kin  Durable Power of Attorney or acting medical decision maker: son Theodore Arellano    Discussion: We discussed goals of care for [name] .  I met with Theodore Arellano son at bedside to discuss his ongoing care and prognosis. I related that he has FTT, severe malnutrition, and a bedside resulting from other illnesses that have contributed to his debility. These are significant indicators of frailty aside from the chronic renal, heart, PAD diseases he has. I related that I think we are high risk for doing interventions to him that will not benefit him. I recommended change in code status to DNR. His son wants to speak to other family members and have them talk to me more later today.   Code status:   Code Status: Full Code   Disposition: Continue current acute care  Time spent for the meeting: 15 min.    Theodore SHAUNNA Gaskins, DO  05/07/2024, 2:00 PM

## 2024-05-07 NOTE — Plan of Care (Signed)
  Problem: Education: Goal: Knowledge of General Education information will improve Description: Including pain rating scale, medication(s)/side effects and non-pharmacologic comfort measures Outcome: Progressing   Problem: Clinical Measurements: Goal: Ability to maintain clinical measurements within normal limits will improve Outcome: Progressing Goal: Will remain free from infection Outcome: Progressing Goal: Diagnostic test results will improve Outcome: Progressing Goal: Respiratory complications will improve Outcome: Progressing Goal: Cardiovascular complication will be avoided Outcome: Progressing   Problem: Safety: Goal: Ability to remain free from injury will improve Outcome: Progressing   Problem: Skin Integrity: Goal: Risk for impaired skin integrity will decrease Outcome: Progressing   

## 2024-05-07 NOTE — Progress Notes (Signed)
 Physical Therapy Treatment Patient Details Name: AMARRION PASTORINO MRN: [id number] DOB: [birth year] Today's Date: 05/07/2024   History of Present Illness 63 y.o. male presenting from SNF rehab 8/2 with AMS and hypothermia. Became bradycardic in ED, intermittently going into complete heart block and requiring transcutaneous pacing. Ongoing workup concerning for sepsis. PMH includes HLD, HTN, CKD III, PAD, systolic HF, blindness in R eye, CABG, gout, DVT, cocaine  abuse, critical lower limb ischemia.    PT Comments  Agreeable to work with therapy again today. Tolerated bed mobility and transfer training with up to mod assist +2 for support. Utilized Stedy to block knees and provide advantage for UEs to pull across horizontal bar. Pt stood x1 from bed, and x2 from perched position. Working on trunk control. HR 50s-70s throughout session. BP 142/82 in bed. Reviewed LE exercises to perform in bed. Patient will continue to benefit from skilled physical therapy services to further improve independence with functional mobility.     If plan is discharge home, recommend the following: Two people to help with walking and/or transfers;Two people to help with bathing/dressing/bathroom;Assistance with cooking/housework;Direct supervision/assist for medications management;Direct supervision/assist for financial management;Assist for transportation;Help with stairs or ramp for entrance;Supervision due to cognitive status   Can travel by private vehicle     No  Equipment Recommendations  None recommended by PT    Recommendations for Other Services       Precautions / Restrictions Precautions Precautions: Fall Recall of Precautions/Restrictions: Impaired Precaution/Restrictions Comments: monitor HR (brady), R eye blind. pressure wounds sacrum and bil heels Required Braces or Orthoses:  (Prevalon boots) Restrictions Weight Bearing Restrictions Per Provider Order: No     Mobility  Bed Mobility Overal bed  mobility: Needs Assistance Bed Mobility: Rolling, Sidelying to Sit, Sit to Sidelying Rolling: +2 for physical assistance, +2 for safety/equipment, Min assist, Used rails Sidelying to sit: Mod assist, +2 for safety/equipment, HOB elevated, Used rails     Sit to sidelying: Min assist, +2 for safety/equipment, Used rails General bed mobility comments: Min assist +2 for safety to roll towards Rt side before rising to EOB with Mod assist for LEs and trunk, which looked better today with him pushing up through RUE. Min assist to lower trunk onto side and able to bring his LEs into bed with light support. Rolls towards Lt with min assist +2 for strength and sequencing.    Transfers Overall transfer level: Needs assistance Equipment used: Ambulation equipment used Transfers: Sit to/from Stand Sit to Stand: Mod assist, +2 physical assistance, From elevated surface, +2 safety/equipment, Via lift equipment           General transfer comment: Mod assist +2 for boost and balance with Stedy, performed from bed x1, and from perched position on [place] x2. Difficulty fully extending knees and hips. Cues for technique, shows good effort. HR in 70s with exertion. SpO2 on 2L up to 100%. Transfer via Lift Equipment: Stedy  Ambulation/Gait                   Stairs             Wheelchair Mobility     Tilt Bed    Modified Rankin (Stroke Patients Only)       Balance Overall balance assessment: Needs assistance Sitting-balance support: Feet supported, Single extremity supported, Bilateral upper extremity supported Sitting balance-Leahy Scale: Poor Sitting balance - Comments: CGA Postural control: Posterior lean Standing balance support: Bilateral upper extremity supported, Reliant on assistive device for balance  Standing balance-Leahy Scale: Zero Standing balance comment: +2 assist to stand with Stedy                            Communication  Communication Communication: Impaired Factors Affecting Communication: Reduced clarity of speech  Cognition Arousal: Alert Behavior During Therapy: Flat affect   PT - Cognitive impairments: Difficult to assess Difficult to assess due to: Impaired communication                     PT - Cognition Comments: Following simple commands today Following commands: Impaired Following commands impaired: Only follows one step commands consistently, Follows one step commands with increased time    Cueing Cueing Techniques: Verbal cues, Gestural cues, Tactile cues  Exercises General Exercises - Lower Extremity Ankle Circles/Pumps: AROM, Both, 10 reps, Supine, Seated Quad Sets: Strengthening, Both, 10 reps, Supine Gluteal Sets: Strengthening, Both, 10 reps, Supine Long Arc Quad: Strengthening, AAROM, Both, 5 reps, Seated Other Exercises Other Exercises: Emphasis on seated balance EOB, and on Stedy. Trunk is flexed majority of time seated on stedy. Able to weight shift Lt and Rt onto feet but not while standing yet. Cues for upright posture.    General Comments General comments (skin integrity, edema, etc.): HR 50s-70s throughout session.      Pertinent Vitals/Pain Pain Assessment Pain Assessment: Faces Faces Pain Scale: Hurts little more Pain Location: buttocks with movement Pain Descriptors / Indicators: Grimacing, Guarding, Sore Pain Intervention(s): Limited activity within patient's tolerance, Monitored during session, Repositioned    Home Living                          Prior Function            PT Goals (current goals can now be found in the care plan section) Acute Rehab PT Goals Patient Stated Goal: Go back to rehab PT Goal Formulation: With patient Time For Goal Achievement: 05/18/24 Potential to Achieve Goals: Fair Progress towards PT goals: Progressing toward goals    Frequency    Min 1X/week      PT Plan      Co-evaluation PT/OT/SLP  Co-Evaluation/Treatment: Yes Reason for Co-Treatment: Complexity of the patient's impairments (multi-system involvement);For patient/therapist safety;Necessary to address cognition/behavior during functional activity;To address functional/ADL transfers PT goals addressed during session: Mobility/safety with mobility;Balance;Proper use of DME;Strengthening/ROM        AM-PAC PT 6 Clicks Mobility   Outcome Measure  Help needed turning from your back to your side while in a flat bed without using bedrails?: A Lot Help needed moving from lying on your back to sitting on the side of a flat bed without using bedrails?: A Lot Help needed moving to and from a bed to a chair (including a wheelchair)?: Total Help needed standing up from a chair using your arms (e.g., wheelchair or bedside chair)?: Total Help needed to walk in hospital room?: Total Help needed climbing 3-5 steps with a railing? : Total 6 Click Score: 8    End of Session Equipment Utilized During Treatment: Gait belt;Oxygen  Activity Tolerance: Patient tolerated treatment well;Other (comment) (Weakness) Patient left: in bed;with call bell/phone within reach;with bed alarm set;with family/visitor present;with nursing/sitter in room;with SCD's reapplied (Prevalon boots on, Pt 1/4 turn towards Lt for pressure relief.) Nurse Communication: Mobility status;Need for lift equipment PT Visit Diagnosis: Other abnormalities of gait and mobility (R26.89);Muscle weakness (generalized) (M62.81);Difficulty in walking, not elsewhere  classified (R26.2);Other symptoms and signs involving the nervous system (R29.898);Pain Pain - Right/Left: Left Pain - part of body: Leg     Time: 8940-8875 PT Time Calculation (min) (ACUTE ONLY): 25 min  Charges:    $Therapeutic Activity: 8-22 mins PT General Charges $$ ACUTE PT VISIT: 1 Visit                     Leontine Roads, PT, DPT [facility]  Rehabilitation Services Physical Therapist Office:  [phone] Website: [email or web address]    Leontine GORMAN Roads 05/07/2024, 12:25 PM

## 2024-05-07 NOTE — Progress Notes (Signed)
 Occupational Therapy Treatment Patient Details Name: Theodore Arellano MRN: [id number] DOB: [birth year] Today's Date: 05/07/2024   History of present illness 63 y.o. male presenting from SNF rehab 8/2 with AMS and hypothermia. Became bradycardic in ED, intermittently going into complete heart block and requiring transcutaneous pacing. Ongoing workup concerning for sepsis. PMH includes HLD, HTN, CKD III, PAD, systolic HF, blindness in R eye, CABG, gout, DVT, cocaine  abuse, critical lower limb ischemia.   OT comments  Pt making incremental progress towards OT goals with improvements noted in bed mobility and sitting balance today. Pt able to progress to standing trials using Stedy with Mod A x 2. HR 50s-70s during session, no reports of dizziness. Pt returned back to bed in pressure offloading position to decrease sacral wound discomfort. Cousin at bedside. Patient will benefit from continued inpatient follow up therapy, <3 hours/day at DC.      If plan is discharge home, recommend the following:  A lot of help with walking and/or transfers;Two people to help with walking and/or transfers;A lot of help with bathing/dressing/bathroom;Two people to help with bathing/dressing/bathroom   Equipment Recommendations  Other (comment) (TBD)    Recommendations for Other Services      Precautions / Restrictions Precautions Precautions: Fall Recall of Precautions/Restrictions: Impaired Precaution/Restrictions Comments: monitor HR (brady), R eye blind. pressure wounds sacrum and bil heels Required Braces or Orthoses:  (Prevalon boots) Restrictions Weight Bearing Restrictions Per Provider Order: No       Mobility Bed Mobility Overal bed mobility: Needs Assistance Bed Mobility: Rolling, Sidelying to Sit, Sit to Sidelying Rolling: +2 for physical assistance, +2 for safety/equipment, Min assist, Used rails Sidelying to sit: Mod assist, +2 for safety/equipment, HOB elevated, Used rails     Sit to  sidelying: Min assist, +2 for safety/equipment, Used rails General bed mobility comments: Min assist +2 for safety to roll towards Rt side before rising to EOB with Mod assist for LEs and trunk, which looked better today with him pushing up through RUE. Min assist to lower trunk onto side and able to bring his LEs into bed with light support. Rolls towards Lt with min assist +2 for strength and sequencing.    Transfers Overall transfer level: Needs assistance Equipment used: Ambulation equipment used Transfers: Sit to/from Stand Sit to Stand: Mod assist, +2 physical assistance, From elevated surface, +2 safety/equipment, Via lift equipment           General transfer comment: Mod assist +2 for boost and balance with Stedy, performed from bed x1, and from perched position on [place] x2. Difficulty fully extending knees and hips. Cues for technique, shows good effort. HR in 70s with exertion. SpO2 on 2L up to 100%. Transfer via Lift Equipment: Stedy   Balance Overall balance assessment: Needs assistance Sitting-balance support: Feet supported, Single extremity supported, Bilateral upper extremity supported Sitting balance-Leahy Scale: Poor Sitting balance - Comments: improved from prior session with pt able to correct posture when cued and no external assist needed Postural control: Posterior lean Standing balance support: Bilateral upper extremity supported, Reliant on assistive device for balance Standing balance-Leahy Scale: Zero Standing balance comment: +2 assist to stand with Stedy                           ADL either performed or assessed with clinical judgement   ADL Overall ADL's : Needs assistance/impaired  General ADL Comments: Emphasis on EOB balance and initial standing attempts    Extremity/Trunk Assessment Upper Extremity Assessment Upper Extremity Assessment: Generalized weakness;Right hand dominant    Lower Extremity Assessment Lower Extremity Assessment: Defer to PT evaluation        Vision   Vision Assessment?: Vision impaired- to be further tested in functional context Additional Comments: R eye blind at baseline   Perception     Praxis     Communication Communication Communication: Impaired Factors Affecting Communication: Reduced clarity of speech   Cognition Arousal: Alert Behavior During Therapy: Flat affect Cognition: History of cognitive impairments Difficult to assess due to: Impaired communication           OT - Cognition Comments: speech garbled and difficult to understand, interference with difficulty clearing secretions. able to follow one step commands consistently, pleasant. decreased insight into deficits. hx of dementia                 Following commands: Impaired Following commands impaired: Only follows one step commands consistently, Follows one step commands with increased time      Cueing   Cueing Techniques: Verbal cues, Gestural cues, Tactile cues  Exercises      Shoulder Instructions       General Comments HR 50s-70s ; cousin at bedside    Pertinent Vitals/ Pain       Pain Assessment Pain Assessment: Faces Faces Pain Scale: Hurts little more Pain Location: buttocks with movement Pain Descriptors / Indicators: Grimacing, Guarding, Sore Pain Intervention(s): Monitored during session, Limited activity within patient's tolerance, Repositioned  Home Living                                          Prior Functioning/Environment              Frequency  Min 2X/week        Progress Toward Goals  OT Goals(current goals can now be found in the care plan section)  Progress towards OT goals: Progressing toward goals  Acute Rehab OT Goals Patient Stated Goal: continue working with therapy OT Goal Formulation: With patient Time For Goal Achievement: 05/18/24 Potential to Achieve Goals: Fair ADL  Goals Pt Will Perform Upper Body Bathing: with min assist;sitting Pt Will Transfer to Toilet: with mod assist;with +2 assist;bedside commode Additional ADL Goal #1: Pt to complete bed mobility with Min A in prep for EOB/OOB ADLs Additional ADL Goal #2: Pt to maintain sitting balance EOB during ADLs > 5 min with no more than CGA for balance  Plan      Co-evaluation    PT/OT/SLP Co-Evaluation/Treatment: Yes Reason for Co-Treatment: Complexity of the patient's impairments (multi-system involvement);For patient/therapist safety;Necessary to address cognition/behavior during functional activity;To address functional/ADL transfers PT goals addressed during session: Mobility/safety with mobility;Balance;Proper use of DME;Strengthening/ROM OT goals addressed during session: ADL's and self-care;Proper use of Adaptive equipment and DME      AM-PAC OT 6 Clicks Daily Activity     Outcome Measure   Help from another person eating meals?: A Lot Help from another person taking care of personal grooming?: A Little Help from another person toileting, which includes using toliet, bedpan, or urinal?: Total Help from another person bathing (including washing, rinsing, drying)?: A Lot Help from another person to put on and taking off regular upper body clothing?: A Lot Help from another person to put on  and taking off regular lower body clothing?: Total 6 Click Score: 11    End of Session Equipment Utilized During Treatment: Gait belt;Oxygen   OT Visit Diagnosis: Unsteadiness on feet (R26.81);Other abnormalities of gait and mobility (R26.89);Muscle weakness (generalized) (M62.81)   Activity Tolerance Patient tolerated treatment well   Patient Left in bed;with call bell/phone within reach;with family/visitor present   Nurse Communication Mobility status        Time: 8940-8874 OT Time Calculation (min): 26 min  Charges: OT General Charges $OT Visit: 1 Visit OT Treatments $Therapeutic  Activity: 8-22 mins  Mliss NOVAK, OTR/L Acute Rehab Services Office: [phone]   Mliss Fish 05/07/2024, 12:42 PM

## 2024-05-07 NOTE — Progress Notes (Signed)
 Palliative:  HPI: 63 y.o. male with past medical history of CAD, HTN, HLD, renal transplant, advanced CAD PAD, anxiety, schizophrenia, psychosis requiring hospitalization March 2025, + cocaine  June 2025 was admitted from facility on 05/02/2024 with sepsis secondary to infected decubitus ulcer on sacrum and heels, acute encephalopathy due to severe symptomatic bradycardia, intermittent complete heart block, AKI with history of renal transplant, bradycardia. Of note, he has had 1 admission and 1 ED visit in the last 6 months.    I met today with Dr. Gretta, son Manus (significant other), and granddaughter Harlene. We had a long conversation regarding Mr. Otten significant health decline over the past months. We reviewed overall chronic health issues that are irreversible and failure to thrive. We discussed the difference between acute and chronic health problems. We reviewed overall poor prognosis with poor functional status and bed-bound status. We discussed risks of further decline in the future. We reviewed the risks vs benefits of resuscitation, artificial feeding, and pacemaker. We reviewed high risk of further decline and acute decompensation. We discussed the importance of knowing Mr. Tugwell wishes, quality of life, and what we are asking him to go through. We discussed the reality of resuscitation efforts causing pain and suffering and expectations of poor outcome. We discussed clearly his poor prognosis. Family still considering options and decisions. They plan to speak with Mr. Padin. I spoke with significant other, Heather, while Manus and Harlene return to bedside. She shares her experience with her mother and she is trying to support Manus through these difficult decisions.   Update: Harlene requested me at bedside and we had a discussed with Mr. Guidotti and Manus present. They have been discussing with Mr. Granier his wishes. They report that he has expressed desire for full code. I further  discussed with Mr. Baldinger resuscitation efforts and what this entails - we discussed the pain and suffering that may come with these interventions. We reviewed poor prognosis and that we do not believe that he would survive resuscitation event. I explained his multiple underlying health issues that have been causing ongoing decline and that there are multiple issues he cannot fix or reverse. Mr. Remer asks how much time do I have? I explained honestly that we do feel that he has limited time due to multiple issues and high risk for further decline. I explained that it is hard to know exactly how much time but we want to ensure that we make the best out of whatever time is left. Mr. Esson is accepting and processing. Ultimately he tells us  just let me go. Clarified decision for DNR status but continuing treatment otherwise to optimize his time with family. Family tearful but accepting.   All questions/concerns addressed. Emotional support provided. Updated/discussed with Dr. Gretta.   Exam: Alert. Full understanding difficult to know as I question recall although he does seem to have understanding during our conversation today. No distress. Less congestion today. Breathing regular, unlabored. HR still fluctuating with bradycardic episodes. Abd soft. Generalized weakness and fatigue.   Plan: - DNR/DNI - No feeding tube - No pacemaker - Continue treatment otherwise to prolong life - Family working with CSW to pursue placement  70 min  Bernarda Kitty, NP Palliative Medicine Team Pager [phone] (Please see [email or web address] for schedule) Team Phone [phone]

## 2024-05-07 NOTE — Progress Notes (Signed)
 NAME:  Theodore Arellano, MRN:  [id number], DOB:  [birth year], LOS: 5 ADMISSION DATE:  05/02/2024, CONSULTATION DATE:  05/02/5024 REFERRING MD:  Dr. Cottie - EDP , CHIEF COMPLAINT:  Septic shock with CHB on arrival to ED    History of Present Illness:  Theodore Arellano is a 63 y.o. male with a PMH significant for CAD, HTN, HLD, renal transplant, advanced CAD PAD, anxiety, who presented to the ED at [facility] from local SNF [facility] for altered mental status.  Per reports from SNF patient typically is alert and oriented x 3 with GCS 14 SNF reports GCS had dropped to 10 with confusion prompting EMS call.  Of note this is in the setting of treatment for sacral wound with ertapenem  at SNF.  On arrival to ED patient was seen bradycardic and mildly hypertensive.  During workup in emergency department patient became more progressively bradycardic with EKG concerning for complete heart block.  At that time patient was placed on external pacing.  Blood work significant for creatinine 2.32 with GFR 31, BUN 42, AST 12, hemoglobin 8, lactic within normal limit of 1.3, INR 1.3.  Given concern for septic shock with complete heart block and critical care consult for further management admission.  Pertinent  Medical History  CAD, HTN, HLD, PAD, anxiety, immunocompromised, s/p Ktxp   Significant Hospital Events: Including procedures, antibiotic start and stop dates in addition to other pertinent events   8/2 presented with altered mental status and workup concerning for sepsis with new onset complete heart block 8/3 cards signed off  8/4 still in and out of CHB requiring TCP, I have re-engaged cards   Interim History / Subjective:  No sig events overnight Intermittent 2nd degree heart block especially when sleeping  Objective    Blood pressure (!) 128/90, pulse 72, temperature 98.4 F (36.9 C), resp. rate 18, height 5' 7 (1.702 m), weight 70.2 kg, SpO2 97%.        Intake/Output Summary (Last 24 hours) at  05/07/2024 1030 Last data filed at 05/07/2024 0900 Gross per 24 hour  Intake 1687.77 ml  Output 1070 ml  Net 617.77 ml   Filed Weights   05/05/24 0500 05/06/24 0139 05/07/24 0500  Weight: 73.1 kg 71 kg 70.2 kg    Examination: General: chronically ill appearing man NAD, awake  HEENT: [place]/AT, eyes anicteric Neuro: awake, slow to respond at times but appropriate, dysarthric, follows commands  Pulm: breathing comfortably on RA, CTAB Heart: S1S2, bradycardic, sinus rhythm on tele.  Abdomen: soft, NT MSK: minimal muscle mass, no peripheral edema  Resolved problem list  Sepsis was ruled out  Assessment and Plan   Stage IV sacral decub POA- concern infected ulcer Unstageable R heel ulcer POA  + Bcx, likely contaminant, staph haemolyticus -con't meropenem  for ulcer -con't wound care, frequent turns, trying to optimize nutrition  CHB intermittently HFrEF CAD PAD  HLD  -high risk for PPM with infection risk and not convinced it would change his long-term outcome. When his HR recovers to normal he does not have different mentation and bradycardia episodes not associated with drops in BP. -con't statin, aspirin , low dose xarelto  -tele monitoring -EP following - rec conservative symptom mgmt and goals of care discussions   Acute encephalopathy superimposed on some degree of underlying dementia - somewhat improved Dysphagia  -reportedly AAOx3 at nursing home, but outpt notes also speak to him being a poor historian etc  P -con't delirium precautions, avoid deliriogenic meds -dysphagia diet  Oliguria AKI on CKD III; improving S/p Kidney transplant on immunosuppressive therapy  -con't mycophenolate , pred, tacro -strict I/O -renally dose meds, avoid nephrotoxic meds -BMP pending today  Inadequate PO intake  Hypoglycemia -con't thiamine  with D10w gtt -dysphagia diet, RN encouraging PO intake -Ensure  Anemia of chronic disease Thrombocytosis  -transfuse for Hb <7 or  hemodynamically significant bleeding  Concern for OSA; high risk for central sleep apnea -bipap at bedtime  GOC -Palliative conversations ongoing  Severe protein energy malnutrition FTT -long term prognosis guarded with malnutrition, debility/ immobility  Constipation -bowel regimen  Chronic indwelling foley--was POA. Not sure why he has a chronic foley- potentially for bedsore? -changed foley 8/5 -needs OP follow up with urology if he has retention  Best Practice (right click and Reselect all SmartList Selections daily)   Diet/type: dysphagia diet (see orders)  DVT prophylaxis DOAC Pressure ulcer(s): present on admission  GI prophylaxis: N/A Lines: N/A Foley:  N/A Code Status:  full code Last date of multidisciplinary goals of care discussion:   son met with [facility] on 8/6   Labs   CBC: Recent Labs  Lab 05/02/24 1153 05/02/24 1623 05/03/24 0426 05/04/24 0829  WBC 6.0 7.8 8.1 7.8  NEUTROABS 5.0  --   --   --   HGB 8.0* 7.9* 8.5* 8.6*  HCT 26.3* 25.5* 28.1* 27.9*  MCV 86.8 86.4 87.0 85.8  PLT 634* 638* 627* 561*    Basic Metabolic Panel: Recent Labs  Lab 05/03/24 0426 05/04/24 0829 05/05/24 0356 05/05/24 1042 05/06/24 1103 05/07/24 0340  NA 143 142  --  140 137 137  K 4.3 3.9  --  3.9 3.8 4.1  CL 113* 111  --  110 109 106  CO2 20* 21*  --  18* 18* 19*  GLUCOSE 81 98  --  91 99 90  BUN 42* 35*  --  32* 31* 28*  CREATININE 2.51* 2.06*  --  2.33* 2.17* 2.08*  CALCIUM  9.3 9.5  --  9.4 9.3 9.4  MG 2.1 2.1  --   --   --   --   PHOS 5.7* 4.2 3.7  --   --   --    GFR: Estimated Creatinine Clearance: 34 mL/min (A) (by C-G formula based on SCr of 2.08 mg/dL (H)). Recent Labs  Lab 05/02/24 1153 05/02/24 1257 05/02/24 1623 05/03/24 0426 05/04/24 0829  WBC 6.0  --  7.8 8.1 7.8  LATICACIDVEN  --  1.3  --   --   --     Liver Function Tests: Recent Labs  Lab 05/02/24 1153  AST 12*  ALT 7  ALKPHOS 52  BILITOT 0.5  PROT 6.8  ALBUMIN  2.0*     ABG    Component Value Date/Time   PHART 7.296 (L) 07/16/2023 2027   PCO2ART 41.6 07/16/2023 2027   PO2ART 261 (H) 07/16/2023 2027   HCO3 20.4 07/16/2023 2027   TCO2 22 07/16/2023 2027   ACIDBASEDEF 6.0 (H) 07/16/2023 2027   O2SAT 100 07/16/2023 2027     Rockie Myers, NP Pulmonary/Critical Care Medicine  05/07/2024  10:30 AM   See Tracey for personal pager PCCM on call pager [phone] until 7pm. Please call Elink 7p-7a. [phone]

## 2024-05-07 NOTE — TOC Progression Note (Signed)
 Transition of Care [facility]) - Progression Note    Patient Details  Name: Theodore Arellano MRN: [id number] Date of Birth: 1961/07/05  [facility] CM/SW Contact  Theodore Arellano, LCSWA Phone Number: 05/07/2024, 12:18 PM  Clinical Narrative:     CSW spoke with patients son Theodore Arellano and provided SNF bed offers. [facility] request for [employer] with accepted SNF bed offers be dropped off in patients room. CSW dropped off medicare compare ratings list with accepted SNF bed offers in patients room. Patients son request for [facility] to drop off HCPOA paperwork. CSW informed [facility] Deneise, who confirmed [facility] will follow up in regards to requested paperwork/questions for [facility]. CSW will continue to follow and assist with patients dc planning needs.   Expected Discharge Plan: Skilled Nursing Facility Barriers to Discharge: Continued Medical Work up               Expected Discharge Plan and Services In-house Referral: Clinical Social Work Discharge Planning Services: CM Consult Post Acute Care Choice: Skilled Nursing Facility Living arrangements for the past 2 months: Skilled Nursing Facility                                       Social Drivers of Health (SDOH) Interventions SDOH Screenings   Food Insecurity: No Food Insecurity (05/02/2024)  Housing: Low Risk  (05/02/2024)  Transportation Needs: Unmet Transportation Needs (05/02/2024)  Utilities: Not At Risk (05/02/2024)  Tobacco Use: High Risk (05/03/2024)    Readmission Risk Interventions     No data to display

## 2024-05-07 NOTE — Progress Notes (Addendum)
 Advanced Heart Failure Rounding Note  Cardiologist: Jennifer JONELLE Crape, MD   Chief Complaint: Bradycardia  Subjective:    Awake and answering questions with short responses.  Currently SR/SB 50s-60s, still with periods of 2nd degree HB and CHB (more predominant during night hours if not wearing BiPAP)  Objective:   Weight Range: 70.2 kg Body mass index is 24.24 kg/m.   Vital Signs:   Temp:  [97.2 F (36.2 C)-98.6 F (37 C)] 98.4 F (36.9 C) (08/07 0900) Pulse Rate:  [36-77] 72 (08/07 0900) Resp:  [13-29] 18 (08/07 0900) BP: (103-132)/(53-99) 128/90 (08/07 0900) SpO2:  [86 %-100 %] 97 % (08/07 0900) Weight:  [70.2 kg] 70.2 kg (08/07 0500) Last BM Date :  (PTA)  Weight change: Filed Weights   05/05/24 0500 05/06/24 0139 05/07/24 0500  Weight: 73.1 kg 71 kg 70.2 kg    Intake/Output:   Intake/Output Summary (Last 24 hours) at 05/07/2024 1053 Last data filed at 05/07/2024 0900 Gross per 24 hour  Intake 1687.77 ml  Output 1070 ml  Net 617.77 ml      Physical Exam    General: Chronically ill appearing. Looks much older than stated age. Cor: Regular rate & rhythm. No murmur. Lungs: clear anteriorly Abdomen: soft, nontender, nondistended.  Extremities: no edema Neuro:Awake. Follows commands. Significant dysarthria.   Labs    CBC No results for input(s): WBC, NEUTROABS, HGB, HCT, MCV, PLT in the last 72 hours.  Basic Metabolic Panel Recent Labs    91/94/74 0356 05/05/24 1042 05/06/24 1103 05/07/24 0340  NA  --    < > 137 137  K  --    < > 3.8 4.1  CL  --    < > 109 106  CO2  --    < > 18* 19*  GLUCOSE  --    < > 99 90  BUN  --    < > 31* 28*  CREATININE  --    < > 2.17* 2.08*  CALCIUM   --    < > 9.3 9.4  PHOS 3.7  --   --   --    < > = values in this interval not displayed.   Liver Function Tests No results for input(s): AST, ALT, ALKPHOS, BILITOT, PROT, ALBUMIN  in the last 72 hours.  No results for input(s): LIPASE,  AMYLASE in the last 72 hours. Cardiac Enzymes No results for input(s): CKTOTAL, CKMB, CKMBINDEX, TROPONINI in the last 72 hours.  BNP: BNP (last 3 results) No results for input(s): BNP in the last 8760 hours.  ProBNP (last 3 results) No results for input(s): PROBNP in the last 8760 hours.   D-Dimer No results for input(s): DDIMER in the last 72 hours. Hemoglobin A1C No results for input(s): HGBA1C in the last 72 hours. Fasting Lipid Panel No results for input(s): CHOL, HDL, LDLCALC, TRIG, CHOLHDL, LDLDIRECT in the last 72 hours. Thyroid  Function Tests No results for input(s): TSH, T4TOTAL, T3FREE, THYROIDAB in the last 72 hours.  Invalid input(s): FREET3   Other results:   Imaging    No results found.    Medications:     Scheduled Medications:  aspirin   81 mg Oral Daily   Chlorhexidine  Gluconate Cloth  6 each Topical Daily   feeding supplement  237 mL Oral TID BM   leptospermum manuka honey  1 Application Topical Daily   multivitamin with minerals  1 tablet Oral Daily   mycophenolate   720 mg Oral BID  mouth rinse  15 mL Mouth Rinse 4 times per day   polyethylene glycol  17 g Oral BID   predniSONE   5 mg Oral Daily   rivaroxaban   2.5 mg Oral BID   rosuvastatin   20 mg Oral Daily   senna  1 tablet Oral Daily   sodium chloride  flush  3 mL Intravenous Q12H   tacrolimus   3 mg Oral Daily   And   tacrolimus   2 mg Oral QHS   tamsulosin   0.4 mg Oral Daily   thiamine  (VITAMIN B1) injection  100 mg Intravenous Daily    Infusions:  dextrose  40 mL/hr at 05/07/24 0900   meropenem  (MERREM ) IV Stopped (05/06/24 2154)   vancomycin  Stopped (05/06/24 1718)    PRN Medications: docusate sodium , mouth rinse, trimethobenzamide     Patient Profile   63 y.o. male with history of DM II, dementia, CKD with prior renal transplant, PAD, CAD s/p CABG X 5 in 2020, ischemic cardiomyopathy tobacco/ETOH/cocaine  abuse. Admitted with sepsis 2/2  infected sacral decubitus ulcer. Course c/b bradycardia w/ intermittent heart block requiring transcutaneous pacing in setting of hypothermia and probable OSA.   Assessment/Plan   1. Bradycardia: Sinus bradycardia and intermittent high grade heart block. Persisted despite correction of hypothermia on presentation. More pronounced during sleep hours when off BiPAP - Discussed with EP. He is not a candidate for TVP or PPM d/t comorbidities including chronic wounds (unstageable heel wound and large stage IV sacral decubitus ulcer) and immunocompromised. Pacing unlikely to change his trajectory.  2. CAD: Hx CABG at [facility] in 2020. HS-TnI not elevated.  - Continue ASA and add statin.  - He is on Xarelto  2.5 mg bid in setting of PAD  3. PAD: Severe PAD, s/p right axillary-profunda/popliteal bypass with thrombosis and redo.  - ASA + Xarelto .  - Continue Crestor  20 daily.   4. AKI on CKD stage 3: Baseline creatinine around 1.9, up to 2.51.   - Now 2.1 - H/o renal transplant.  - Continue immunosuppressants.   5. ID: Has heel and sacral decubitus ulcers, has been on ertapenem  at SNF.  ?Sepsis syndrome as cause of hypothermia though WBCs not elevated. BC with contaminant - Abx per CCM  6. Neuro: Mention of dementia and recent stroke. Uncertain baseline.  - Significant dysarthria noted  7. Ischemic cardiomyopathy: Echo in 7/25 at [place] with EF 45-50%, apical hypokinesis.   - Echo this admit: EF 35-40%, RWMA, moderate LVH, RV okay - Does not appear volume overloaded. GDMT limited by renal impairment  8. Active smoking: Recommend complete cessation.  9. Goals of care: Palliative Care following for GOC discussions. He is frail with multiple comorbid conditions. - Remains full code - Overall poor prognosis   CRITICAL CARE Performed by: COLLETTA SHAVER N   Total critical care time: 15 minutes  Critical care time was exclusive of separately billable procedures and treating other  patients.  Critical care was necessary to treat or prevent imminent or life-threatening deterioration.  Critical care was time spent personally by me on the following activities: development of treatment plan with patient and/or surrogate as well as nursing, discussions with consultants, evaluation of patient's response to treatment, examination of patient, obtaining history from patient or surrogate, ordering and performing treatments and interventions, ordering and review of laboratory studies, ordering and review of radiographic studies, pulse oximetry and re-evaluation of patient's condition.   Length of Stay: 5  FINCH, LINDSAY N, PA-C  05/07/2024, 10:53 AM  Advanced Heart Failure Team Pager  [phone] (M-F; 7a - 5p)  Please contact [facility] for night-coverage after hours (5p -7a ) and weekends on [email or web address]  Agee with above.   Awake. Alert. Hard to understand due to aphasia.   Continues to have 4.0-4.5sec pauses with intermittent CHB. Hemodynamically stable.   General: chronically ill. No resp difficulty HEENT: normal Neck: supple. JVP 5  Cor: Irregular brady Lungs: clear Abdomen: soft, nontender, nondistended. No hepatosplenomegaly. No bruits or masses. Good bowel sounds. Extremities: no cyanosis, clubbing, rash, edema Neuro: awake alert following aphasic   I have discussed his case extensively with EP and CCM. Despite significant high-grade HB he is not a candidate for permanent pacing due to severe comorbidities including a very large sacral decubiti. Strongly recommend transition to Palliative Care.   AHF team will sign off.   CRITICAL CARE Performed by: Cherrie Sieving  Total critical care time: 45 minutes  Critical care time was exclusive of separately billable procedures and treating other patients.  Critical care was necessary to treat or prevent imminent or life-threatening deterioration.  Critical care was time spent personally by me (independent of  midlevel providers or residents) on the following activities: development of treatment plan with patient and/or surrogate as well as nursing, discussions with consultants, evaluation of patient's response to treatment, examination of patient, obtaining history from patient or surrogate, ordering and performing treatments and interventions, ordering and review of laboratory studies, ordering and review of radiographic studies, pulse oximetry and re-evaluation of patient's condition.  Sieving Cherrie, MD  3:33 PM

## 2024-05-07 NOTE — Progress Notes (Signed)
   05/07/24 1430  Spiritual Encounters  Type of Visit Initial  Care provided to: Pt and family  Conversation partners present during encounter Nurse  Referral source Patient request;Family  Reason for visit Advance directives  OnCall Visit No    Chaplain met with [name] and son Theodore Arellano. [name] verbally expressed that he wishes Theodore Arellano to serve as his HCPOA. By default, this is the case already. Both requested to have the Advance Directive document filled out to reflect this. We were able to accommodate this request after consulting with another chaplain and the Director of the Spiritual Care Department. As Chaden was unable to discuss goals of care beyond [facility], the Living Will portion of the document was not filled out. The original and a copy of the signed/notarized document were given to [facility], and another copy was placed in his binder chart. Chaplains remain available.

## 2024-05-07 NOTE — IPAL (Signed)
  Interdisciplinary Goals of Care Family Meeting   Date carried out: 05/07/2024  Location of the meeting: Conference room  Member's involved: Physician, Family Member or next of kin, and Palliative care team member  Durable Power of Attorney or acting medical decision maker: Son Manus    Discussion: We discussed goals of care for [name] .  We reviewed his overall health status and decline over the last few months with needing SNF care and 2 hospitalizations. We discussed that we need to ensure we are all understanding where we are and are prepared for the things that could come up as his health continues to deteriorate-- what to do if he has respiratory failure regarding vent, progressive renal failure if he needed HD, if his heart stops, if he stops eating. His family understands the benefit of having a plan as his health deteriorates. They want to discuss his goals and these questions with him before making decisions. We recommended DNR given that with his bradycardia, if he has a cardiac arrest we would be unlikely to sustain this without pacing him.    Code status:   Code Status: Full Code   Disposition: Continue current acute care  Time spent for the meeting: 30 min.    Theodore SHAUNNA Gaskins, DO  05/07/2024, 5:27 PM

## 2024-05-08 LAB — GLUCOSE, CAPILLARY
Glucose-Capillary: 79 mg/dL (ref 70–99)
Glucose-Capillary: 87 mg/dL (ref 70–99)
Glucose-Capillary: 67 mg/dL — ABNORMAL LOW (ref 70–99)
Glucose-Capillary: 122 mg/dL — ABNORMAL HIGH (ref 70–99)
Glucose-Capillary: 126 mg/dL — ABNORMAL HIGH (ref 70–99)

## 2024-05-08 LAB — BASIC METABOLIC PANEL WITH GFR
Anion gap: 10 (ref 5–15)
BUN: 27 mg/dL — ABNORMAL HIGH (ref 8–23)
CO2: 19 mmol/L — ABNORMAL LOW (ref 22–32)
Calcium: 9.5 mg/dL (ref 8.9–10.3)
Chloride: 108 mmol/L (ref 98–111)
Creatinine, Ser: 1.82 mg/dL — ABNORMAL HIGH (ref 0.61–1.24)
GFR, Estimated: 41 mL/min — ABNORMAL LOW (ref >60)
Glucose, Bld: 83 mg/dL (ref 70–99)
Potassium: 4.6 mmol/L (ref 3.5–5.1)
Sodium: 137 mmol/L (ref 135–145)

## 2024-05-08 MED ORDER — GABAPENTIN 300 MG PO CAPS
300.0000 mg | ORAL_CAPSULE | Freq: Three times a day (TID) | ORAL | Status: DC
  Administered 2024-05-08 – 2024-05-09 (×2): 300 mg via ORAL
  Filled 2024-05-08 (×2): qty 1

## 2024-05-08 MED ORDER — FLEET ENEMA RE ENEM
1.0000 | ENEMA | Freq: Once | RECTAL | Status: DC
  Filled 2024-05-08: qty 1

## 2024-05-08 MED ORDER — ACETAMINOPHEN 325 MG PO TABS: 650.0000 mg | ORAL_TABLET | Freq: Four times a day (QID) | ORAL | Status: DC | PRN

## 2024-05-08 NOTE — Progress Notes (Signed)
 NAME:  Theodore Arellano, MRN:  [id number], DOB:  07-Aug-1961, LOS: 6 ADMISSION DATE:  05/02/2024, CONSULTATION DATE:  05/02/5024 REFERRING MD:  Dr. Cottie - EDP , CHIEF COMPLAINT:  Septic shock with CHB on arrival to ED    History of Present Illness:  Theodore Arellano is a 63 y.o. male with a PMH significant for CAD, HTN, HLD, renal transplant, advanced CAD PAD, anxiety, who presented to the ED at [facility] from local SNF [facility] for altered mental status.  Per reports from SNF patient typically is alert and oriented x 3 with GCS 14 SNF reports GCS had dropped to 10 with confusion prompting EMS call.  Of note this is in the setting of treatment for sacral wound with ertapenem  at SNF.  On arrival to ED patient was seen bradycardic and mildly hypertensive.  During workup in emergency department patient became more progressively bradycardic with EKG concerning for complete heart block.  At that time patient was placed on external pacing.  Blood work significant for creatinine 2.32 with GFR 31, BUN 42, AST 12, hemoglobin 8, lactic within normal limit of 1.3, INR 1.3.  Given concern for septic shock with complete heart block and critical care consult for further management admission.  Pertinent  Medical History  CAD, HTN, HLD, PAD, anxiety, immunocompromised, s/p Ktxp   Significant Hospital Events: Including procedures, antibiotic start and stop dates in addition to other pertinent events   8/2 presented with altered mental status and workup concerning for sepsis with new onset complete heart block 8/3 cards signed off  8/4 still in and out of CHB requiring TCP, I have re-engaged cards   Interim History / Subjective:  Overnight no acute events. No BM since admission. He denies SOB, pain.  Objective    Blood pressure 113/81, pulse (!) 56, temperature 98.1 F (36.7 C), resp. rate 10, height 5' 7 (1.702 m), weight 71.4 kg, SpO2 100%.        Intake/Output Summary (Last 24 hours) at 05/08/2024  1038 Last data filed at 05/08/2024 0900 Gross per 24 hour  Intake 1263.2 ml  Output 980 ml  Net 283.2 ml   Filed Weights   05/06/24 0139 05/07/24 0500 05/08/24 0500  Weight: 71 kg 70.2 kg 71.4 kg    Examination: General:  frail and chronically ill appearing man lying in bed in NAD HEENT: [place]/AT, eyes anicteric  Neuro: awake but sitting up slumped over in bed, globally weak. Speech less dysarthric today. Pulm: breathing comfortably on [place], CTAB Heart: S1S2, RRR.  Abdomen: soft, minimally TTP, thin MSK: minimal muscle mass  BUN 27 Cr 1.82 BG 60-100s  Resolved problem list  Sepsis was ruled out  Assessment and Plan   Stage IV sacral decub POA- concern infected ulcer Unstageable R heel ulcer POA  + Bcx, likely contaminant, staph haemolyticus -Con't meropenem  for ulcer; was supposed to stop ertapenem  on 8/3. Complete 1 week of vanc & stop meropenem  today.  -wound care, turns, optimize nutrition-- his appetite has been very limited.  CHB intermittently; most noticeable when sleeping HFrEF CAD PAD  HLD  -not a candidate for PPM due to high risk of infection; not sure this would change his long-term trajectory -con't low dose apixaban, aspirin , statin -con't telemetry monitoring -DNR is appropriate with advanced conduction disease with overall FTT  Acute encephalopathy superimposed on some degree of underlying dementia - somewhat improved Dysphagia  -reportedly AAOx3 at nursing home, but outpt notes also speak to him being a  poor historian etc  P -delirium precautions, avoid deliriogenic meds con't delirium precautions, avoid deliriogenic meds -dysphagia diet  Oliguria AKI on CKD III; continuing to improve S/p Kidney transplant on immunosuppressive therapy  -con't tacro, prednisone , mycophenolate  -strict I/O -renally dose meds, avoid nephrotoxic meds  Inadequate PO intake  Hypoglycemia -d10w gtt  +thiamine  to prevent hypoglycemia -dyspahgia diet, encouraging PO  intake. Family has discussed bringing things in like mashed potatoes from home. -Ensure  Anemia of chronic disease Thrombocytosis  -transfuse for Hb <7 or hemodynamically significant bleeding  -recheck tomorrow  Concern for OSA; high risk for central sleep apnea -BiPAP QHS  GOC -Palliative conversations ongoing-- see ipal note and Palliative note 8/7. Family thinking about some of the long-term things that will likely come up int he coming months with his deteriorating health overall. DNR is appropriate at this time.  Severe protein energy malnutrition FTT -long-term prognosis guarded -encourage PO intake  Constipation -bowel regimen> adding soap suds enema today -going to be a challenge with his wound   Chronic indwelling foley--was POA. Not sure why he has a chronic foley- potentially for bedsore? -changed foley 8/5 -needs OP urology follow up if he has retention   Midline removed today due to malfunction. Has PIV.  Stable to transfer out of ICU today. Will discuss with CM. Anticipate he can return to SNF in the next few days.  Best Practice (right click and Reselect all SmartList Selections daily)   Diet/type: dysphagia diet (see orders)  DVT prophylaxis DOAC Pressure ulcer(s): present on admission  GI prophylaxis: N/A Lines: N/A Foley:  N/A Code Status:  full code Last date of multidisciplinary goals of care discussion:   8/7- see ipal   Labs   CBC: Recent Labs  Lab 05/02/24 1153 05/02/24 1623 05/03/24 0426 05/04/24 0829  WBC 6.0 7.8 8.1 7.8  NEUTROABS 5.0  --   --   --   HGB 8.0* 7.9* 8.5* 8.6*  HCT 26.3* 25.5* 28.1* 27.9*  MCV 86.8 86.4 87.0 85.8  PLT 634* 638* 627* 561*    Basic Metabolic Panel: Recent Labs  Lab 05/03/24 0426 05/04/24 0829 05/05/24 0356 05/05/24 1042 05/06/24 1103 05/07/24 0340 05/08/24 0320  NA 143 142  --  140 137 137 137  K 4.3 3.9  --  3.9 3.8 4.1 4.6  CL 113* 111  --  110 109 106 108  CO2 20* 21*  --  18* 18* 19*  19*  GLUCOSE 81 98  --  91 99 90 83  BUN 42* 35*  --  32* 31* 28* 27*  CREATININE 2.51* 2.06*  --  2.33* 2.17* 2.08* 1.82*  CALCIUM  9.3 9.5  --  9.4 9.3 9.4 9.5  MG 2.1 2.1  --   --   --   --   --   PHOS 5.7* 4.2 3.7  --   --   --   --    GFR: Estimated Creatinine Clearance: 38.8 mL/min (A) (by C-G formula based on SCr of 1.82 mg/dL (H)). Recent Labs  Lab 05/02/24 1153 05/02/24 1257 05/02/24 1623 05/03/24 0426 05/04/24 0829  WBC 6.0  --  7.8 8.1 7.8  LATICACIDVEN  --  1.3  --   --   --     Liver Function Tests: Recent Labs  Lab 05/02/24 1153  AST 12*  ALT 7  ALKPHOS 52  BILITOT 0.5  PROT 6.8  ALBUMIN  2.0*    ABG    Component Value Date/Time  PHART 7.296 (L) 07/16/2023 2027   PCO2ART 41.6 07/16/2023 2027   PO2ART 261 (H) 07/16/2023 2027   HCO3 20.4 07/16/2023 2027   TCO2 22 07/16/2023 2027   ACIDBASEDEF 6.0 (H) 07/16/2023 2027   O2SAT 100 07/16/2023 2027    Leita SHAUNNA Gaskins, DO 05/08/24 11:00 AM [facility]  For contact information, see Amion. If no response to pager, please call PCCM consult pager. After hours, 7PM- 7AM, please call Elink.

## 2024-05-08 NOTE — Progress Notes (Signed)
 eLink Physician-Brief Progress Note Patient Name: ELYJAH HAZAN DOB: [birth year] MRN: [id number]   Date of Service  05/08/2024  HPI/Events of Note  Chronic hip pain Initially presented with new onset heart block and encephalopathy Renal function close to baseline  eICU Interventions  Resume home gabapentin  And acetaminophen  as needed     Intervention Category Intermediate Interventions: Pain - evaluation and management  Juriel Cid 05/08/2024, 10:13 PM

## 2024-05-08 NOTE — Plan of Care (Signed)
   Problem: Health Behavior/Discharge Planning: Goal: Ability to manage health-related needs will improve Outcome: Progressing   Problem: Clinical Measurements: Goal: Ability to maintain clinical measurements within normal limits will improve Outcome: Progressing Goal: Will remain free from infection Outcome: Progressing

## 2024-05-08 NOTE — TOC Progression Note (Addendum)
 Transition of Care [facility]) - Progression Note    Patient Details  Name: Theodore Arellano MRN: [id number] Date of Birth: 1961/05/01  [facility] CM/SW Contact  Isaiah Public, LCSWA Phone Number: 05/08/2024, 11:30 AM  Clinical Narrative:     CSW called patients son Manus unable to leave voicemail. CSW will try back when able to discuss SNF choice with patients son.  Update- CSW received call back from patients son Manus. Manus accepted SNF bed offer with [facility] for patient. CSW confirmed SNF bed with Whitney with [facility]. CSW informed [facility] that patient will need Bipap at SNF. Whitney confirmed facility will have Bipap for patient. CSW started insurance authorization for patient. Auth ID# [id number]. CSW will continue to follow.  Expected Discharge Plan: Skilled Nursing Facility Barriers to Discharge: Continued Medical Work up               Expected Discharge Plan and Services In-house Referral: Clinical Social Work Discharge Planning Services: CM Consult Post Acute Care Choice: Skilled Nursing Facility Living arrangements for the past 2 months: Skilled Nursing Facility                                       Social Drivers of Health (SDOH) Interventions SDOH Screenings   Food Insecurity: No Food Insecurity (05/02/2024)  Housing: Low Risk  (05/02/2024)  Transportation Needs: Unmet Transportation Needs (05/02/2024)  Utilities: Not At Risk (05/02/2024)  Tobacco Use: High Risk (05/03/2024)    Readmission Risk Interventions     No data to display

## 2024-05-09 DIAGNOSIS — A419 Sepsis, unspecified organism: Secondary | ICD-10-CM | POA: Diagnosis not present

## 2024-05-09 DIAGNOSIS — R6520 Severe sepsis without septic shock: Secondary | ICD-10-CM | POA: Diagnosis not present

## 2024-05-09 DIAGNOSIS — G9341 Metabolic encephalopathy: Secondary | ICD-10-CM | POA: Diagnosis not present

## 2024-05-09 LAB — CBC
HCT: 25.3 % — ABNORMAL LOW (ref 39.0–52.0)
Hemoglobin: 8 g/dL — ABNORMAL LOW (ref 13.0–17.0)
MCH: 26.1 pg (ref 26.0–34.0)
MCHC: 31.6 g/dL (ref 30.0–36.0)
MCV: 82.4 fL (ref 80.0–100.0)
Platelets: 417 K/uL — ABNORMAL HIGH (ref 150–400)
RBC: 3.07 MIL/uL — ABNORMAL LOW (ref 4.22–5.81)
RDW: 14.4 % (ref 11.5–15.5)
WBC: 7.4 K/uL (ref 4.0–10.5)
nRBC: 0 % (ref 0.0–0.2)

## 2024-05-09 LAB — BASIC METABOLIC PANEL WITH GFR
Anion gap: 8 (ref 5–15)
BUN: 23 mg/dL (ref 8–23)
CO2: 19 mmol/L — ABNORMAL LOW (ref 22–32)
Calcium: 9.4 mg/dL (ref 8.9–10.3)
Chloride: 107 mmol/L (ref 98–111)
Creatinine, Ser: 1.6 mg/dL — ABNORMAL HIGH (ref 0.61–1.24)
GFR, Estimated: 48 mL/min — ABNORMAL LOW (ref >60)
Glucose, Bld: 91 mg/dL (ref 70–99)
Potassium: 4.4 mmol/L (ref 3.5–5.1)
Sodium: 134 mmol/L — ABNORMAL LOW (ref 135–145)

## 2024-05-09 LAB — GLUCOSE, CAPILLARY
Glucose-Capillary: 85 mg/dL (ref 70–99)
Glucose-Capillary: 86 mg/dL (ref 70–99)
Glucose-Capillary: 86 mg/dL (ref 70–99)

## 2024-05-09 MED ORDER — ROSUVASTATIN CALCIUM 20 MG PO TABS: 20.0000 mg | ORAL_TABLET | Freq: Every day | ORAL | Status: AC

## 2024-05-09 MED ORDER — MEDIHONEY WOUND/BURN DRESSING EX PSTE: 1.0000 | PASTE | Freq: Every day | CUTANEOUS | Status: DC

## 2024-05-09 MED ORDER — AMLODIPINE BESYLATE 5 MG PO TABS: 10.0000 mg | ORAL_TABLET | Freq: Every day | ORAL | Status: DC

## 2024-05-09 MED ORDER — THIAMINE HCL 100 MG PO TABS: 100.0000 mg | ORAL_TABLET | Freq: Every day | ORAL | Status: AC

## 2024-05-09 MED ORDER — ENSURE PLUS HIGH PROTEIN PO LIQD: 237.0000 mL | Freq: Three times a day (TID) | ORAL | Status: AC

## 2024-05-09 MED ORDER — DOCUSATE SODIUM 100 MG PO CAPS
100.0000 mg | ORAL_CAPSULE | Freq: Two times a day (BID) | ORAL | 0 refills | Status: AC | PRN
Start: 2024-05-09 — End: ?

## 2024-05-09 MED ORDER — ADULT MULTIVITAMIN W/MINERALS CH: 1.0000 | ORAL_TABLET | Freq: Every day | ORAL | Status: AC

## 2024-05-09 NOTE — Plan of Care (Signed)
?  Problem: Nutrition: Goal: Adequate nutrition will be maintained Outcome: Progressing   Problem: Elimination: Goal: Will not experience complications related to urinary retention Outcome: Progressing   Problem: Safety: Goal: Ability to remain free from injury will improve Outcome: Progressing   

## 2024-05-09 NOTE — TOC Transition Note (Signed)
 Transition of Care [facility]) - Discharge Note   Patient Details  Name: Theodore Arellano MRN: [id number] Date of Birth: 01/03/1961  Transition of Care [facility]) CM/SW Contact:  Gwenn Julien [place], [place] Phone Number: 05/09/2024, 11:30 AM   Clinical Narrative: Pt for dc to [facility] today. Provided auth details [id number], valid 8/8-8/12) to [facility] in admissions who confirmed they are prepared to admit pt to room 109. Pt's son Manus at bedside and agreeable to dc plan. RN provided with number for report and PTAR arranged for transport. SW signing off at dc.   Julien Gwenn, MSW, LCSW [phone] (coverage)        Final next level of care: Skilled Nursing Facility Barriers to Discharge: Barriers Resolved   Patient Goals and CMS Choice   CMS Medicare.gov Compare Post Acute Care list provided to:: Patient Represenative (must comment)   [place] ownership interest in [employer].provided to:: Adult Children    Discharge Placement              Patient chooses bed at: Other - please specify in the comment section below: [facility]) Patient to be transferred to facility by: PTAR Name of family member notified: Phillip/son Patient and family notified of of transfer: 05/09/24  Discharge Plan and Services Additional resources added to the After Visit Summary for   In-house Referral: Clinical Social Work Discharge Planning Services: CM Consult Post Acute Care Choice: Skilled Nursing Facility                               Social Drivers of Health (SDOH) Interventions SDOH Screenings   Food Insecurity: No Food Insecurity (05/02/2024)  Housing: Low Risk  (05/02/2024)  Transportation Needs: Unmet Transportation Needs (05/02/2024)  Utilities: Not At Risk (05/02/2024)  Tobacco Use: High Risk (05/03/2024)     Readmission Risk Interventions     No data to display

## 2024-05-09 NOTE — Discharge Summary (Signed)
 Physician Discharge Summary   Patient: Theodore Arellano MRN: [id number] DOB: 1961/08/12  Admit date:     05/02/2024  Discharge date: 05/09/24  Discharge Physician: [name]   PCP: [name], MD     Recommendations at discharge:  Follow up with PCP within 1 week Follow up with Dr. Nolene Cardiology within 2 months Follow up with Nephrology within 2 months Wound care as below Recommend Palliative to follow at SNF Check hemoglobin in 1 week Use BiPAP every night, settings: 10/5 cmH2O     Discharge Diagnoses: Principal Problem:   Sepsis due to infected sacral decubitus ulcer  Active Problems:   CHB (complete heart block) (HCC)   Stage IV sacral decubitus ulcer, present on admission   Unstageable right heel ulcer, present on admission   Protein-calorie malnutrition, severe   Positive blood culture   Chronic systolic congestive heart failure   Ischemic cardiomyopathy   Coronary artery disease   Peripheral vascular disease   Hyperlipidemia   Acute metabolic encephalopathy   Dementia   Dysphagia   Acute kidney injury on CKD stage IIIa   History of renal transplant   Hyperglycemia   Anemia of chronic disease   Suspected sleep apnea   Failure to thrive   Chronic indwelling Foley     Hospital Course: 63 y.o. M with dementia, lives in LTC, hx renal transplant on tacro, MMF, prednisone , recent decubitus wound infection, CKD IIIa baseline 1.3-1.8, PVD, CAD s/p CABG x5 in 2020 at [facility], ischemic cardiomyopathy who presented with decreased mentation, found to have sepsis from infected ulcer.  Lives at [facility], typically alert and oriented x 3, but had recently been on ertapenem  for infected decubitus ulcer, and on day of admission, seemed confused and less responsive so EMS was activated.  In the ER, patient became progressively bradycardic and required external pacing    Sepsis due to infected sacral decubitus ulcer Admitted with  hypothyermia, bradycardia encephalopathy and renal failure.  Likely from infected decubitus ulcer and multiorgan failure in setting of failure to thrive.  Treated with carbapenem.  Symptoms gradually resolved.    Complete heart block, intermittent Evaluated by cardiology.  Given the high risk of infection, he is not a candidate for transvenous pacing or pacemaker.  Cardiology recommended transition to comfort care.  CODE STATUS changed to DNR.  Goals are to treat the treatable.   Acute metabolic encephalopathy At baseline the patient is typically forgetful, but oriented and interactive.  Typically alert and oriented at least x 2.  On the day of admission, was poorly responsive and disoriented.  This was found to be from sepsis encephalopathy.  At the time of discharge, he was oriented to self, place, and roughly oriented to time.   Sleep apnea Suspected.  Noted to have worsening heart block with sleeping, improved with nocturnal BiPAP - See BiPAP settings above  Anemia Likely anemia of CKD.  Hgb similar to previous baseline.  Likely suppressed in setting of chronic decubitus ulcer.    Stage IV sacral decubitus ulcer Failure to thrive Severe protein calorie malnutrition Dietitian was consulted, wound care was consulted.  At the time of discharge, recommend palliative care to follow, maximal nutritional supplements, following wound care:  Sacrum wound:  Cleanse the wound bed with Vashe cleaner.  Use a syringe to wash the tunneling.  Pat dry the skin around the wound Apply Aquacel filling the depth and the tunneling.  Pack with gauze to guarantee the Aquacel touching the  wound bed.  Cover with foam dressing or ABD pad. Change daily or as needed for soiling    Left heel:  Cleanse with Vashe cleaner, pat dry, no rinse.  Apply Santyl  or Medihoney daily on the wound bed, cover with foam dressing Change every 3 days or as needed for soiling          [facility] [place]   Controlled Substances Registry was reviewed for this patient prior to discharge.   Consultants: Cardiology, Critical Care   Disposition: Long term care facility Diet recommendation:  Dysphagia type 2 minced, thin Liquid  DISCHARGE MEDICATION: Allergies as of 05/09/2024       Reactions   Percocet [oxycodone -acetaminophen ] Hives, Itching   Vicodin [hydrocodone -acetaminophen ] Hives, Itching   Shellfish Allergy Hives   Chocolate Flavoring Agent (non-screening) Hives   Chocolate Hives   Tomato Hives        Medication List     PAUSE taking these medications    losartan  100 MG tablet Wait to take this until your doctor or other care provider tells you to start again. Commonly known as: COZAAR  Take 100 mg by mouth daily.       STOP taking these medications    ertapenem  1 g injection Commonly known as: INVANZ    nystatin 100000 UNIT/ML suspension Commonly known as: MYCOSTATIN   sodium chloride  0.9 % infusion   triamcinolone cream 0.1 % Commonly known as: KENALOG   UNABLE TO FIND       TAKE these medications    acetaminophen  500 MG tablet Commonly known as: TYLENOL  Take 500 mg by mouth every 6 (six) hours as needed for moderate pain or headache.   allopurinol 100 MG tablet Commonly known as: ZYLOPRIM Take 100 mg by mouth daily.   amLODipine  5 MG tablet Commonly known as: NORVASC  Take 2 tablets (10 mg total) by mouth daily. What changed: medication strength   aspirin  EC 81 MG tablet Take 81 mg by mouth daily.   docusate sodium  100 MG capsule Commonly known as: COLACE Take 1 capsule (100 mg total) by mouth 2 (two) times daily as needed for mild constipation.   feeding supplement Liqd Take 237 mLs by mouth 3 (three) times daily between meals.   furosemide  40 MG tablet Commonly known as: LASIX  Take 40 mg by mouth daily.   gabapentin  100 MG capsule Commonly known as: NEURONTIN  Take 300 mg by mouth 3 (three) times daily.   lactulose 10 GM/15ML  solution Commonly known as: CHRONULAC Take 20 g by mouth 3 (three) times daily.   leptospermum manuka honey Pste paste Apply 1 Application topically daily.   magnesium  oxide 400 (240 Mg) MG tablet Commonly known as: MAG-OX Take 400 mg by mouth 2 (two) times daily.   multivitamin with minerals Tabs tablet Take 1 tablet by mouth daily. Start taking on: May 10, 2024   mycophenolate  360 MG Tbec EC tablet Commonly known as: MYFORTIC  Take 720 mg by mouth 2 (two) times daily.   pantoprazole  40 MG tablet Commonly known as: PROTONIX  Take 40 mg by mouth daily before breakfast.   predniSONE  5 MG tablet Commonly known as: DELTASONE  Take 5 mg by mouth daily.   rosuvastatin  20 MG tablet Commonly known as: CRESTOR  Take 1 tablet (20 mg total) by mouth daily. Start taking on: May 10, 2024   senna 8.6 MG tablet Commonly known as: SENOKOT Take 17.2 mg by mouth 2 (two) times daily.   tacrolimus  1 MG capsule Commonly known as: PROGRAF  Take 2-3  mg by mouth See admin instructions. Give 3 capsules (3mg ) by mouth in the morning and give 2 capsules (2mg ) in the afternoon.   tamsulosin  0.4 MG Caps capsule Commonly known as: FLOMAX  Take 0.4 mg by mouth daily.   thiamine  100 MG tablet Commonly known as: VITAMIN B1 Take 1 tablet (100 mg total) by mouth daily.   Xarelto  2.5 MG Tabs tablet Generic drug: rivaroxaban  Take 1 tablet (2.5 mg total) by mouth 2 (two) times daily.               Discharge Care Instructions  (From admission, onward)           Start     Ordered   05/09/24 0000  Discharge wound care:       Comments: Sacrum wound:  Cleanse the wound bed with Vashe cleaner.  Use a syringe to wash the tunneling.  Pat dry the skin around the wound Apply Aquacel filling the depth and the tunneling.  Pack with gauze to guarantee the Aquacel touching the wound bed.  Cover with foam dressing or ABD pad. Change daily or as needed for soiling    Left heel:  Cleanse  with Vashe cleaner, pat dry, no rinse.  Apply Santyl  or Medihoney daily on the wound bed, cover with foam dressing Change every 3 days or as needed for soiling   05/09/24 1032            Contact information for after-discharge care     Destination     [facility] .   Service: Skilled Nursing Contact information: [address] [place]  [address] [phone]                     Discharge Instructions     Discharge wound care:   Complete by: As directed    Sacrum wound:  Cleanse the wound bed with Vashe cleaner.  Use a syringe to wash the tunneling.  Pat dry the skin around the wound Apply Aquacel filling the depth and the tunneling.  Pack with gauze to guarantee the Aquacel touching the wound bed.  Cover with foam dressing or ABD pad. Change daily or as needed for soiling    Left heel:  Cleanse with Vashe cleaner, pat dry, no rinse.  Apply Santyl  or Medihoney daily on the wound bed, cover with foam dressing Change every 3 days or as needed for soiling   Increase activity slowly   Complete by: As directed        Discharge Exam: Filed Weights   05/07/24 0500 05/08/24 0500 05/09/24 0500  Weight: 70.2 kg 71.4 kg 71.4 kg    General: Pt sleeping but rouses to touch, not in acute distress Cardiovascular: RRR, nl S1-S2, no murmurs appreciated.   No LE edema.   Respiratory: Normal respiratory rate and rhythm.  CTAB without rales or wheezes. Very diminshed overall. Abdominal: Abdomen soft and non-tender.  No distension or HSM.   MSK: Severe diffuse loss of subcutaneous muscle mass and fat Neuro/Psych: Strength symmetric in upper and lower extremities severely generally weak.  Judgment and insight appear impaired but close to baseline.  Oriented to self, hospital, not month or date.   Condition at discharge: poor  The results of significant diagnostics from this hospitalization (including imaging, microbiology, ancillary and  laboratory) are listed below for reference.   Imaging Studies: DG Swallowing Func-Speech Pathology Result Date: 05/04/2024 Table formatting from the original result was not included. Modified Barium Swallow Study  Patient Details Name: Theodore Arellano MRN: [id number] Date of Birth: [birth year] Today's Date: 05/04/2024 HPI/PMH: HPI: 63 y.o. male presenting from SNF rehab 8/2 with AMS and hypothermia. Became bradycardic in ED, intermittently going into complete heart block and requiring transcutaneous pacing. Ongoing workup concerning for sepsis. PMH includes HLD, HTN, CKD III, PAD, systolic HF, blindness in R eye, CABG, gout, DVT, cocaine  abuse, critical lower limb ischemia Clinical Impression: Clinical Impression: Pt exhibits mild oropharyngeal dysphagia with deficits related to efficiency more so than safety. He has difficulty sitting upright so the majority of the study was conducted in his natural, anterior-leaning posture. No penetration/aspiration occurred. Lingual pumping was noted suggesting oral inefficiency but he was attentive to mastication of solids despite upper edentulism. Mild pharyngeal residue (<50%) accumulated with purees and solids more than liquids. A liquid wash was effecitve in clearing the majority of solid residuals. The coughing and wet vocal quality that was previously observed was not noted today. Suspect his positioning will significantly impact performance. There was distal esophageal retention of barium and the tablet noted during the sweep so recommend keeping pt sitting upright for ~30 minutes after meals. Recommend Dys 2 solids with thin liquids and meds whole in puree. Discussed with RN and will f/u. Factors that may increase risk of adverse event in presence of aspiration Noe & Lianne 2021): Factors that may increase risk of adverse event in presence of aspiration Noe & Lianne 2021): Reduced cognitive function; Limited mobility; Frail or deconditioned; Dependence for feeding  and/or oral hygiene; Inadequate oral hygiene Recommendations/Plan: Swallowing Evaluation Recommendations Swallowing Evaluation Recommendations Recommendations: PO diet PO Diet Recommendation: Dysphagia 2 (Finely chopped); Thin liquids (Level 0) Liquid Administration via: Cup Medication Administration: Whole meds with puree Supervision: Full assist for feeding; Full supervision/cueing for swallowing strategies Swallowing strategies  : Minimize environmental distractions; Slow rate; Small bites/sips; Check for anterior loss; Follow solids with liquids Postural changes: Position pt fully upright for meals; Stay upright 30-60 min after meals Oral care recommendations: Oral care BID (2x/day) Treatment Plan Treatment Plan Treatment recommendations: Therapy as outlined in treatment plan below Follow-up recommendations: Skilled nursing-short term rehab (<3 hours/day) Functional status assessment: Patient has had a recent decline in their functional status and demonstrates the ability to make significant improvements in function in a reasonable and predictable amount of time. Treatment frequency: Min 2x/week Treatment duration: 2 weeks Interventions: Aspiration precaution training; Compensatory techniques; Patient/family education; Trials of upgraded texture/liquids Recommendations Recommendations for follow up therapy are one component of a multi-disciplinary discharge planning process, led by the attending physician.  Recommendations may be updated based on patient status, additional functional criteria and insurance authorization. Assessment: Orofacial Exam: Orofacial Exam Oral Cavity: Oral Hygiene: WFL Oral Cavity - Dentition: Missing dentition (mostly intact lower anterior dentition, missing all upper dentition) Orofacial Anatomy: WFL Oral Motor/Sensory Function: WFL Anatomy: Anatomy: Suspected cervical osteophytes Boluses Administered: Boluses Administered Boluses Administered: Thin liquids (Level 0); Mildly thick  liquids (Level 2, nectar thick); Moderately thick liquids (Level 3, honey thick); Puree; Solid  Oral Impairment Domain: Oral Impairment Domain Lip Closure: Escape from interlabial space or lateral juncture, no extension beyond vermillion border Tongue control during bolus hold: Not tested Bolus preparation/mastication: Timely and efficient chewing and mashing Bolus transport/lingual motion: Repetitive/disorganized tongue motion Oral residue: Trace residue lining oral structures Location of oral residue : Tongue; Palate Initiation of pharyngeal swallow : Valleculae  Pharyngeal Impairment Domain: Pharyngeal Impairment Domain Soft palate elevation: No bolus between soft palate (SP)/pharyngeal wall (PW) Laryngeal elevation: Complete  superior movement of thyroid  cartilage with complete approximation of arytenoids to epiglottic petiole Anterior hyoid excursion: Complete anterior movement Epiglottic movement: Complete inversion Laryngeal vestibule closure: Complete, no air/contrast in laryngeal vestibule Pharyngeal stripping wave : Present - diminished Pharyngeal contraction (A/P view only): N/A Pharyngoesophageal segment opening: Complete distension and complete duration, no obstruction of flow Tongue base retraction: Trace column of contrast or air between tongue base and PPW Pharyngeal residue: Collection of residue within or on pharyngeal structures Location of pharyngeal residue: Tongue base; Valleculae; Pyriform sinuses  Esophageal Impairment Domain: Esophageal Impairment Domain Esophageal clearance upright position: Esophageal retention Pill: Pill Consistency administered: Puree Puree: WFL Penetration/Aspiration Scale Score: Penetration/Aspiration Scale Score 1.  Material does not enter airway: Thin liquids (Level 0); Mildly thick liquids (Level 2, nectar thick); Moderately thick liquids (Level 3, honey thick); Puree; Solid; Pill Compensatory Strategies: Compensatory Strategies Compensatory strategies: No   General  Information: Caregiver present: Yes  Diet Prior to this Study: NPO   Temperature : Normal   Respiratory Status: WFL   Supplemental O2: None (Room air)   History of Recent Intubation: No  Behavior/Cognition: Alert; Cooperative; Requires cueing Self-Feeding Abilities: Needs assist with self-feeding Baseline vocal quality/speech: Normal Volitional Cough: Unable to elicit Volitional Swallow: Able to elicit Exam Limitations: Poor positioning Goal Planning: Prognosis for improved oropharyngeal function: Good Barriers to Reach Goals: Cognitive deficits No data recorded Patient/Family Stated Goal: none stated Consulted and agree with results and recommendations: Patient; Nurse Pain: Pain Assessment Pain Assessment: Faces Faces Pain Scale: 0 Facial Expression: 0 Body Movements: 0 Muscle Tension: 0 Compliance with ventilator (intubated pts.): N/A Vocalization (extubated pts.): 0 CPOT Total: 0 Pain Location: LLE with movement/touch Pain Descriptors / Indicators: Grimacing; Guarding Pain Intervention(s): Monitored during session End of Session: Start Time:SLP Start Time (ACUTE ONLY): 1217 Stop Time: SLP Stop Time (ACUTE ONLY): 1244 Time Calculation:SLP Time Calculation (min) (ACUTE ONLY): 27 min Charges: SLP Evaluations $ SLP Speech Visit: 1 Visit SLP Evaluations $BSS Swallow: 1 Procedure $MBS Swallow: 1 Procedure SLP visit diagnosis: SLP Visit Diagnosis: Dysphagia, oropharyngeal phase (R13.12) Past Medical History: Past Medical History: Diagnosis Date  Abnormality of gait following cerebrovascular accident (CVA)   Anxiety   Arthritis   lower back (11/28/2017)  CAD (coronary artery disease)   Chronic lower back pain   H/O immunosuppressive therapy   chronic/notes 11/28/2017  History of gout   before kidney transport (11/28/2017)  Hyperlipidemia   Hypertension   Kidney disease   s/p kidney transplant 2011; not on dialysis now (11/28/2017)  Peripheral arterial disease (HCC)   Stroke (HCC)   Tobacco use  Past Surgical History:  Past Surgical History: Procedure Laterality Date  ABDOMINAL AORTOGRAM W/LOWER EXTREMITY Bilateral 12/29/2020  Procedure: ABDOMINAL AORTOGRAM W/LOWER EXTREMITY;  Surgeon: Gretta Lonni PARAS, MD;  Location: [facility] INVASIVE CV LAB;  Service: Cardiovascular;  Laterality: Bilateral;  ARTERIOVENOUS GRAFT PLACEMENT Right 04/2007  /notes 02/01/2011  AV FISTULA PLACEMENT Left 12/20/2005; 12/2006  thelbert 02/13/2011; thelbert 02/13/2011  AXILLARY-FEMORAL BYPASS GRAFT Right 01/03/2021  Procedure: RIGHT AXILLARY TO PROFUNDA FEMORAL BYPASS;  Surgeon: Oris Krystal FALCON, MD;  Location: [facility] OR;  Service: Vascular;  Laterality: Right;  AXILLARY-FEMORAL BYPASS GRAFT Right 07/16/2023  Procedure: REDO RIGHT SARAHANN MOMENT WITH PATCH ANGIOPLSTY RIGHT POPLITEAL ARTERY.;  Surgeon: Sheree Penne Lonni, MD;  Location: [facility] OR;  Service: Vascular;  Laterality: Right;  CARDIAC CATHETERIZATION    EMBOLECTOMY  07/16/2023  Procedure: EMBOLECTOMY RIGHT Axilliofemoral and femoral/popliteal bypass graft.;  Surgeon: Sheree Penne Lonni, MD;  Location: [facility] OR;  Service: Vascular;;  FEMORAL-POPLITEAL BYPASS GRAFT Right 01/03/2021  Procedure: RIGHT FEMORAL TO BELOW KNEE POPLITEAL ARTERY BYPASS;  Surgeon: Oris Krystal FALCON, MD;  Location: [facility] OR;  Service: Vascular;  Laterality: Right;  FEMORAL-POPLITEAL BYPASS GRAFT Right 07/19/2023  Procedure: REDO RIGHT FEMORAL-POPLITEAL ARTERY BYPASS GRAFT;  Surgeon: Sheree Penne Bruckner, MD;  Location: [facility] OR;  Service: Vascular;  Laterality: Right;  HD access procedures    INGUINAL HERNIA REPAIR Left   KIDNEY TRANSPLANT  2011  LEFT HEART CATH AND CORONARY ANGIOGRAPHY N/A 11/29/2017  Procedure: LEFT HEART CATH AND CORONARY ANGIOGRAPHY;  Surgeon: Anner Alm ORN, MD;  Location: [facility] INVASIVE CV LAB;  Service: Cardiovascular;  Laterality: N/A;  LEFT HEART CATH AND CORONARY ANGIOGRAPHY N/A 06/01/2019  Procedure: LEFT HEART CATH AND CORONARY ANGIOGRAPHY;  Surgeon: Verlin Bruckner BIRCH, MD;  Location: [facility] INVASIVE  CV LAB;  Service: Cardiovascular;  Laterality: N/A;  LEFT HEART CATHETERIZATION WITH CORONARY ANGIOGRAM  03/11/2012  Procedure: LEFT HEART CATHETERIZATION WITH CORONARY ANGIOGRAM;  Surgeon: Toribio JONELLE Fuel, MD;  Location: [facility] CATH LAB;  Service: Cardiovascular;;  LOWER EXTREMITY ANGIOGRAM Right 07/19/2023  Procedure: LOWER EXTREMITY ANGIOGRAM;  Surgeon: Sheree Penne Bruckner, MD;  Location: [facility] OR;  Service: Vascular;  Laterality: Right;  PATCH ANGIOPLASTY Right 07/19/2023  Procedure: PATCH ANGIOPLASTY USING GEORGE BIOLOGIC PATCH;  Surgeon: Sheree Penne Bruckner, MD;  Location: [facility] OR;  Service: Vascular;  Laterality: Right;  RIGHT HEART CATHETERIZATION N/A 03/11/2012  Procedure: RIGHT HEART CATH;  Surgeon: Toribio JONELLE Fuel, MD;  Location: [facility] CATH LAB;  Service: Cardiovascular;  Laterality: N/A;  THROMBECTOMY Right 12/2007  thelbert 02/01/2011  THROMBECTOMY / ARTERIOVENOUS GRAFT REVISION Left 12/2006  thelbert 02/13/2011  THROMBECTOMY / ARTERIOVENOUS GRAFT REVISION Right 07/2007; 10/2007;01/2008;  thelbert 01/31/2011; thelbert 02/01/2011; thelbert 01/31/2011  THROMBECTOMY AND REVISION OF ARTERIOVENTOUS (AV) GORETEX  GRAFT  03/2007 X 2  thelbert 02/01/2011  THROMBECTOMY OF BYPASS GRAFT FEMORAL- POPLITEAL ARTERY Right 07/19/2023  Procedure: THROMBECTOMY OF BYPASS GRAFT FEMORAL-POPLITEAL ARTERY;  Surgeon: Sheree Penne Bruckner, MD;  Location: [facility] OR;  Service: Vascular;  Laterality: Right; Damien Blumenthal, M.A., CCC-SLP Speech Language Pathology, Acute Rehabilitation Services Secure Chat preferred [phone] 05/04/2024, 1:42 PM  ECHOCARDIOGRAM COMPLETE Result Date: 05/03/2024    ECHOCARDIOGRAM REPORT   Patient Name:   Theodore Arellano Date of Exam: 05/03/2024 Medical Rec #:  [id number]     Height:       67.0 in Accession #:    [id number]    Weight:       158.7 lb Date of Birth:  1961-05-12     BSA:          1.833 m Patient Age:    62 years      BP:           119/79 mmHg Patient Gender: M             HR:           69 bpm. Exam Location:   Inpatient Procedure: 2D Echo, Cardiac Doppler, Color Doppler and Intracardiac            Opacification Agent (Both Spectral and Color Flow Doppler were            utilized during procedure). Indications:    I50.21 Acute systolic (congestive) heart failure  History:        Patient has prior history of Echocardiogram examinations, most                 recent 03/16/2024. CAD, ESRD and PAD,  Arrythmias:Heart Block;                 Risk Factors:Hypertension, Diabetes and Dyslipidemia.  Sonographer:    Damien Senior RDCS Referring Phys: [phone] DALTON S MCLEAN IMPRESSIONS  1. Distal septal apical mid and basal inferior wall akinesis . Left ventricular ejection fraction, by estimation, is 35 to 40%. The left ventricle has moderately decreased function. The left ventricle demonstrates regional wall motion abnormalities (see  scoring diagram/findings for description). There is moderate asymmetric left ventricular hypertrophy of the basal and septal segments. Left ventricular diastolic parameters were normal.  2. Right ventricular systolic function is normal. The right ventricular size is normal.  3. The mitral valve is abnormal. Mild to moderate mitral valve regurgitation. No evidence of mitral stenosis. Moderate mitral annular calcification.  4. The aortic valve is tricuspid. There is moderate calcification of the aortic valve. There is moderate thickening of the aortic valve. Aortic valve regurgitation is mild. Aortic valve sclerosis/calcification is present, without any evidence of aortic stenosis.  5. Aortic dilatation noted. There is moderate dilatation of the aortic root, measuring 41 mm.  6. The inferior vena cava is normal in size with greater than 50% respiratory variability, suggesting right atrial pressure of 3 mmHg. FINDINGS  Left Ventricle: Distal septal apical mid and basal inferior wall akinesis. Left ventricular ejection fraction, by estimation, is 35 to 40%. The left ventricle has moderately decreased function.  The left ventricle demonstrates regional wall motion abnormalities. Definity  contrast agent was given IV to delineate the left ventricular endocardial borders. Strain was performed and the global longitudinal strain is indeterminate. The left ventricular internal cavity size was normal in size. There is moderate asymmetric left ventricular hypertrophy of the basal and septal segments. Left ventricular diastolic parameters were normal. Right Ventricle: The right ventricular size is normal. No increase in right ventricular wall thickness. Right ventricular systolic function is normal. Left Atrium: Left atrial size was normal in size. Right Atrium: Right atrial size was normal in size. Pericardium: There is no evidence of pericardial effusion. Mitral Valve: The mitral valve is abnormal. There is moderate thickening of the mitral valve leaflet(s). There is moderate calcification of the mitral valve leaflet(s). Moderate mitral annular calcification. Mild to moderate mitral valve regurgitation. No evidence of mitral valve stenosis. Tricuspid Valve: The tricuspid valve is normal in structure. Tricuspid valve regurgitation is mild . No evidence of tricuspid stenosis. Aortic Valve: The aortic valve is tricuspid. There is moderate calcification of the aortic valve. There is moderate thickening of the aortic valve. Aortic valve regurgitation is mild. Aortic valve sclerosis/calcification is present, without any evidence of aortic stenosis. Pulmonic Valve: The pulmonic valve was normal in structure. Pulmonic valve regurgitation is not visualized. No evidence of pulmonic stenosis. Aorta: Aortic dilatation noted. There is moderate dilatation of the aortic root, measuring 41 mm. Venous: The inferior vena cava is normal in size with greater than 50% respiratory variability, suggesting right atrial pressure of 3 mmHg. IAS/Shunts: No atrial level shunt detected by color flow Doppler. Additional Comments: 3D was performed not requiring  image post processing on an independent workstation and was indeterminate.  LEFT VENTRICLE PLAX 2D LVIDd:         4.50 cm   Diastology LVIDs:         3.30 cm   LV e' medial:    5.11 cm/s LV PW:         1.10 cm   LV E/e' medial:  12.5 LV IVS:  1.60 cm   LV e' lateral:   8.70 cm/s LVOT diam:     2.40 cm   LV E/e' lateral: 7.3 LV SV:         82 LV SV Index:   45 LVOT Area:     4.52 cm  RIGHT VENTRICLE RV S prime:     6.96 cm/s TAPSE (M-mode): 1.7 cm LEFT ATRIUM             Index        RIGHT ATRIUM           Index LA diam:        3.20 cm 1.75 cm/m   RA Area:     17.10 cm LA Vol (A2C):   44.2 ml 24.11 ml/m  RA Volume:   47.60 ml  25.97 ml/m LA Vol (A4C):   29.3 ml 15.98 ml/m LA Biplane Vol: 36.5 ml 19.91 ml/m  AORTIC VALVE LVOT Vmax:   103.00 cm/s LVOT Vmean:  78.700 cm/s LVOT VTI:    0.181 m  AORTA Ao Root diam: 4.10 cm MITRAL VALVE MV Area (PHT): 1.88 cm    SHUNTS MV Decel Time: 404 msec    Systemic VTI:  0.18 m MV E velocity: 63.90 cm/s  Systemic Diam: 2.40 cm MV A velocity: 76.00 cm/s MV E/A ratio:  0.84 Maude Emmer MD Electronically signed by Maude Emmer MD Signature Date/Time: 05/03/2024/2:55:17 PM    Final    CT HEAD WO CONTRAST ( ) Result Date: 05/02/2024 CLINICAL DATA:  Altered mental status. EXAM: CT HEAD WITHOUT CONTRAST TECHNIQUE: Contiguous axial images were obtained from the base of the skull through the vertex without intravenous contrast. RADIATION DOSE REDUCTION: This exam was performed according to the departmental dose-optimization program which includes automated exposure control, adjustment of the mA and/or kV according to patient size and/or use of iterative reconstruction technique. COMPARISON:  CT scan head from 12/08/2020. FINDINGS: Brain: No evidence of acute infarction, hemorrhage, hydrocephalus, extra-axial collection or mass lesion/mass effect. There is bilateral periventricular hypodensity, which is non-specific but most likely seen in the settings of microvascular  ischemic changes. Moderate in extent. Otherwise normal appearance of brain parenchyma. Ventricles are normal. Cerebral volume is age appropriate. Vascular: No hyperdense vessel or unexpected calcification. Intracranial arteriosclerosis. Skull: Normal. Negative for fracture or focal lesion. Sinuses/Orbits: There is complete opacification of the right frontal sinus and right maxillary sinus. Moderate to severe mucoperiosteal thickening noted in the bilateral ethmoidal air cells. Minimal mucoperiosteal thickening in the left maxillary sinus and both chambers of sphenoid sinus. Other: Visualized mastoid air cells are unremarkable. No mastoid effusion. IMPRESSION: 1. No acute intracranial abnormality. 2. Moderate chronic microvascular ischemic changes in the white matter. 3. 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 Electronically Signed   By: Ree Molt M.D.   On: 05/02/2024 15:28   CT CHEST ABDOMEN PELVIS WO CONTRAST Result Date: 05/02/2024 CLINICAL DATA:  Sepsis.  Renal failure. EXAM: CT CHEST, ABDOMEN AND PELVIS WITHOUT CONTRAST TECHNIQUE: Multidetector CT imaging of the chest, abdomen and pelvis was performed following the standard protocol without IV contrast. RADIATION DOSE REDUCTION: This exam was performed according to the departmental dose-optimization program which includes automated exposure control, adjustment of the mA and/or kV according to patient size and/or use of iterative reconstruction technique. COMPARISON:  CT scan abdomen and pelvis from 09/11/2020. FINDINGS: CT CHEST FINDINGS Cardiovascular: Normal cardiac size. No pericardial effusion. No aortic aneurysm. There are coronary artery calcifications, in keeping with coronary artery  disease. There are also moderate peripheral atherosclerotic vascular calcifications of thoracic aorta and its major branches. Left atrial appendage closure device noted. Postsurgical changes noted in the proximal aortic arch. Mediastinum/Nodes:  Visualized thyroid  gland appears grossly unremarkable. No solid / cystic mediastinal masses. The esophagus is nondistended precluding optimal assessment. No mediastinal or axillary lymphadenopathy by size criteria. Evaluation of bilateral hila is limited due to lack on intravenous contrast: however, no large hilar lymphadenopathy identified. Lungs/Pleura: The central tracheo-bronchial tree is patent. There are occlusive and nonocclusive filling defects in the left lung lower lobe bronchial tree, concerning for aspiration versus mucous/secretions. There are dependent changes in bilateral lungs. Minimal upper lobe predominant emphysematous changes noted. No mass, consolidation, pleural effusion or pneumothorax. No suspicious lung nodule. Musculoskeletal: Right axillofemoral bypass graft noted. The visualized soft tissues of the chest wall are grossly unremarkable. No suspicious osseous lesions. CT ABDOMEN PELVIS FINDINGS Hepatobiliary: The liver is normal in size. Non-cirrhotic configuration. No suspicious mass. No intrahepatic or extrahepatic bile duct dilation. No calcified gallstones. Normal gallbladder wall thickness. No pericholecystic inflammatory changes. Pancreas: Unremarkable. No pancreatic ductal dilatation or surrounding inflammatory changes. Spleen: Within normal limits. No focal lesion. Adrenals/Urinary Tract: Adrenal glands are unremarkable. Moderately atrophic bilateral native kidneys. There are several hypoattenuating structures in bilateral kidneys, not well evaluated on this unenhanced exam. Left-sided pelvic renal transplant noted. There is mild fullness in the collecting system. No nephroureterolithiasis. Urinary bladder is decompressed secondary to a Foley catheter. Stomach/Bowel: Small to moderate sliding hiatal hernia noted. No disproportionate dilation of the small or large bowel loops. No evidence of abnormal bowel wall thickening or inflammatory changes. The appendix is unremarkable. There is  moderate stool burden. Vascular/Lymphatic: No ascites or pneumoperitoneum. No abdominal or pelvic lymphadenopathy, by size criteria. No aneurysmal dilation of the major abdominal arteries. There are marked peripheral atherosclerotic vascular calcifications of the aorta and its major branches. Reproductive: Enlarged prostate. Symmetric seminal vesicles. Other: There is a tiny fat containing umbilical hernia. Focal scarring noted in the left inguinal region, likely from prior intervention. The soft tissues and abdominal wall are otherwise unremarkable. Musculoskeletal: No suspicious osseous lesions. There are moderate multilevel degenerative changes in the visualized spine. IMPRESSION: 1. No acute inflammatory process identified within the chest, abdomen or pelvis. 2. There are occlusive and nonocclusive filling defects in the left lung lower lobe bronchial tree, concerning for aspiration versus mucous/secretions. 3. Multiple other nonacute observations, as described above. Aortic Atherosclerosis (ICD10-I70.0) and Emphysema (ICD10-J43.9). Electronically Signed   By: Ree Molt M.D.   On: 05/02/2024 15:25   DG Chest Port 1 View Result Date: 05/02/2024 CLINICAL DATA:  Possible sepsis. EXAM: PORTABLE CHEST 1 VIEW COMPARISON:  03/22/2024 FINDINGS: Lungs are adequately inflated without focal airspace consolidation or effusion. Cardiomediastinal silhouette and remainder of the exam is unchanged. IMPRESSION: No active disease. Electronically Signed   By: Toribio Agreste M.D.   On: 05/02/2024 12:27   XR Tibia/Fibula Right Result Date: 04/15/2024 X-rays of the right tib-fib show mild degenerative changes of the knee joint.  No acute abnormalities.  There is vascular calcifications posterior to the knee.  There are surgical clips within the soft tissue.  No obvious masses.   Microbiology: Results for orders placed or performed during the hospital encounter of 05/02/24  Blood Culture (routine x 2)     Status:  Abnormal   Collection Time: 05/02/24 12:30 PM   Specimen: BLOOD  Result Value Ref Range Status   Specimen Description BLOOD LEFT ANTECUBITAL  Final  Special Requests   Final    BOTTLES DRAWN AEROBIC AND ANAEROBIC Blood Culture results may not be optimal due to an inadequate volume of blood received in culture bottles   Culture  Setup Time   Final    GRAM POSITIVE COCCI IN CLUSTERS AEROBIC BOTTLE ONLY CRITICAL RESULT CALLED TO, READ BACK BY AND VERIFIED WITH: PHARMD MADELIN MITCHELL [id number] AT 0842 BY EC    Culture (A)  Final    STAPHYLOCOCCUS HAEMOLYTICUS THE SIGNIFICANCE OF ISOLATING THIS ORGANISM FROM A SINGLE SET OF BLOOD CULTURES WHEN MULTIPLE SETS ARE DRAWN IS UNCERTAIN. PLEASE NOTIFY THE MICROBIOLOGY DEPARTMENT WITHIN ONE WEEK IF SPECIATION AND SENSITIVITIES ARE REQUIRED. Performed at [facility], [address], [place], [place] [address]    Report Status 05/04/2024 FINAL  Final  Blood Culture ID Panel (Reflexed)     Status: Abnormal   Collection Time: 05/02/24 12:30 PM  Result Value Ref Range Status   Enterococcus faecalis NOT DETECTED NOT DETECTED Final   Enterococcus Faecium NOT DETECTED NOT DETECTED Final   Listeria monocytogenes NOT DETECTED NOT DETECTED Final   Staphylococcus species DETECTED (A) NOT DETECTED Final    Comment: CRITICAL RESULT CALLED TO, READ BACK BY AND VERIFIED WITH: PHARMD MADELINE MITCHELL [id number] AT 0842 BY EC    Staphylococcus aureus (BCID) NOT DETECTED NOT DETECTED Final   Staphylococcus epidermidis NOT DETECTED NOT DETECTED Final   Staphylococcus lugdunensis NOT DETECTED NOT DETECTED Final   Streptococcus species NOT DETECTED NOT DETECTED Final   Streptococcus agalactiae NOT DETECTED NOT DETECTED Final   Streptococcus pneumoniae NOT DETECTED NOT DETECTED Final   Streptococcus pyogenes NOT DETECTED NOT DETECTED Final   A.calcoaceticus-baumannii NOT DETECTED NOT DETECTED Final   Bacteroides fragilis NOT DETECTED NOT DETECTED Final    Enterobacterales NOT DETECTED NOT DETECTED Final   Enterobacter cloacae complex NOT DETECTED NOT DETECTED Final   Escherichia coli NOT DETECTED NOT DETECTED Final   Klebsiella aerogenes NOT DETECTED NOT DETECTED Final   Klebsiella oxytoca NOT DETECTED NOT DETECTED Final   Klebsiella pneumoniae NOT DETECTED NOT DETECTED Final   Proteus species NOT DETECTED NOT DETECTED Final   Salmonella species NOT DETECTED NOT DETECTED Final   Serratia marcescens NOT DETECTED NOT DETECTED Final   Haemophilus influenzae NOT DETECTED NOT DETECTED Final   Neisseria meningitidis NOT DETECTED NOT DETECTED Final   Pseudomonas aeruginosa NOT DETECTED NOT DETECTED Final   Stenotrophomonas maltophilia NOT DETECTED NOT DETECTED Final   Candida albicans NOT DETECTED NOT DETECTED Final   Candida auris NOT DETECTED NOT DETECTED Final   Candida glabrata NOT DETECTED NOT DETECTED Final   Candida krusei NOT DETECTED NOT DETECTED Final   Candida parapsilosis NOT DETECTED NOT DETECTED Final   Candida tropicalis NOT DETECTED NOT DETECTED Final   Cryptococcus neoformans/gattii NOT DETECTED NOT DETECTED Final    Comment: Performed at [facility], [address] [place], [place] [address]  MRSA Next Gen by PCR, Nasal     Status: None   Collection Time: 05/02/24  3:48 PM   Specimen: Nasal Mucosa; Nasal Swab  Result Value Ref Range Status   MRSA by PCR Next Gen NOT DETECTED NOT DETECTED Final    Comment: (NOTE) The GeneXpert MRSA Assay (FDA approved for NASAL specimens only), is one component of a comprehensive MRSA colonization surveillance program. It is not intended to diagnose MRSA infection nor to guide or monitor treatment for MRSA infections. Test performance is not FDA approved in patients less than 2 years  old. Performed at [facility], [address], [place], [place] [address]   Blood Culture (routine x 2)     Status: None   Collection Time: 05/02/24  5:09 PM   Specimen: BLOOD RIGHT HAND   Result Value Ref Range Status   Specimen Description BLOOD RIGHT HAND  Final   Special Requests   Final    BOTTLES DRAWN AEROBIC AND ANAEROBIC Blood Culture adequate volume   Culture   Final    NO GROWTH 5 DAYS Performed at [facility], [address] [place], [place] [address]    Report Status 05/07/2024 FINAL  Final    Labs: CBC: Recent Labs  Lab 05/02/24 1153 05/02/24 1623 05/03/24 0426 05/04/24 0829 05/09/24 0237  WBC 6.0 7.8 8.1 7.8 7.4  NEUTROABS 5.0  --   --   --   --   HGB 8.0* 7.9* 8.5* 8.6* 8.0*  HCT 26.3* 25.5* 28.1* 27.9* 25.3*  MCV 86.8 86.4 87.0 85.8 82.4  PLT 634* 638* 627* 561* 417*   Basic Metabolic Panel: Recent Labs  Lab 05/03/24 0426 05/04/24 0829 05/05/24 0356 05/05/24 1042 05/06/24 1103 05/07/24 0340 05/08/24 0320 05/09/24 0237  NA 143 142  --  140 137 137 137 134*  K 4.3 3.9  --  3.9 3.8 4.1 4.6 4.4  CL 113* 111  --  110 109 106 108 107  CO2 20* 21*  --  18* 18* 19* 19* 19*  GLUCOSE 81 98  --  91 99 90 83 91  BUN 42* 35*  --  32* 31* 28* 27* 23  CREATININE 2.51* 2.06*  --  2.33* 2.17* 2.08* 1.82* 1.60*  CALCIUM  9.3 9.5  --  9.4 9.3 9.4 9.5 9.4  MG 2.1 2.1  --   --   --   --   --   --   PHOS 5.7* 4.2 3.7  --   --   --   --   --    Liver Function Tests: Recent Labs  Lab 05/02/24 1153  AST 12*  ALT 7  ALKPHOS 52  BILITOT 0.5  PROT 6.8  ALBUMIN  2.0*   CBG: Recent Labs  Lab 05/08/24 1128 05/08/24 1518 05/09/24 0034 05/09/24 0405 05/09/24 0755  GLUCAP 122* 126* 85 86 86    Discharge time spent: approximately 45 minutes spent on discharge counseling, evaluation of patient on day of discharge, and coordination of discharge planning with nursing, social work, pharmacy and case management  Signed: [name], MD [facility] 05/09/2024

## 2024-06-05 ENCOUNTER — Other Ambulatory Visit: Payer: Self-pay

## 2024-06-05 ENCOUNTER — Emergency Department (HOSPITAL_COMMUNITY)

## 2024-06-05 ENCOUNTER — Emergency Department (HOSPITAL_COMMUNITY)
Admission: EM | Admit: 2024-06-05 | Discharge: 2024-06-05 | Disposition: A | Discharge status: Home / Self Care | Attending: Emergency Medicine | Admitting: Emergency Medicine

## 2024-06-05 DIAGNOSIS — Z7901 Long term (current) use of anticoagulants: Secondary | ICD-10-CM | POA: Diagnosis not present

## 2024-06-05 DIAGNOSIS — M25562 Pain in left knee: Secondary | ICD-10-CM | POA: Diagnosis not present

## 2024-06-05 DIAGNOSIS — N186 End stage renal disease: Secondary | ICD-10-CM | POA: Insufficient documentation

## 2024-06-05 DIAGNOSIS — E1122 Type 2 diabetes mellitus with diabetic chronic kidney disease: Secondary | ICD-10-CM | POA: Diagnosis not present

## 2024-06-05 DIAGNOSIS — I509 Heart failure, unspecified: Secondary | ICD-10-CM | POA: Insufficient documentation

## 2024-06-05 DIAGNOSIS — M79672 Pain in left foot: Secondary | ICD-10-CM | POA: Insufficient documentation

## 2024-06-05 DIAGNOSIS — Z7982 Long term (current) use of aspirin: Secondary | ICD-10-CM | POA: Insufficient documentation

## 2024-06-05 DIAGNOSIS — M79605 Pain in left leg: Secondary | ICD-10-CM

## 2024-06-05 DIAGNOSIS — I132 Hypertensive heart and chronic kidney disease with heart failure and with stage 5 chronic kidney disease, or end stage renal disease: Secondary | ICD-10-CM | POA: Insufficient documentation

## 2024-06-05 DIAGNOSIS — Z79899 Other long term (current) drug therapy: Secondary | ICD-10-CM | POA: Diagnosis not present

## 2024-06-05 NOTE — ED Notes (Signed)
 Left heel wound rewrapped by this RN after PA assessment

## 2024-06-05 NOTE — ED Notes (Signed)
 Called for pt pick up ETA 1 hour PTAR

## 2024-06-05 NOTE — ED Triage Notes (Signed)
 Pt BIB [facility] from [facility]. Left foot and leg pain. Bed sores are present on leg and back. No hx of fall or trauma. Had wound care yesterday and pain started over night. 7 out of 10 pain. A&Ox4. Urinary catheter in place from facility.  VS 140/60 76 HR 96 RA CBG 131

## 2024-06-05 NOTE — Discharge Instructions (Addendum)
 Today you were seen for left knee pain.  Your workup was reassuring.  I suspect this is likely musculoskeletal pain.  Please alternate taking Tylenol  and Motrin  as needed for pain.  Please follow-up with your primary care if your symptoms persist for further evaluation and workup.  Thank you for letting us  treat you today. After reviewing your imaging, I feel you are safe to go home. Please follow up with your PCP in the next several days and provide them with your records from this visit. Return to the Emergency Room if pain becomes severe or symptoms worsen.

## 2024-06-05 NOTE — Progress Notes (Signed)
 LLE venous exam is completed. Rebbeca Sheperd, RVT

## 2024-06-05 NOTE — ED Provider Notes (Signed)
 [place] EMERGENCY DEPARTMENT AT [facility] Provider Note   CSN: [id number] Arrival date & time: 06/05/24  1025     Patient presents with: Leg Pain   Theodore Arellano is a 63 y.o. male past medical history significant for ESRD, diabetes, DVT, hypertension, CHF, and arthritis presents today for left foot and leg pain.  Patient has bedsores present on leg and back.  Patient last received wound care yesterday and pain started overnight.  Patient reports that the pain is in the back of his left knee.  Patient denies fever, chills, nausea, vomiting, chest pain, or shortness of breath.  Patient currently anticoagulated on Xarelto .    Leg Pain      Prior to Admission medications   Medication Sig Start Date End Date Taking? Authorizing Provider  acetaminophen  (TYLENOL ) 500 MG tablet Take 500 mg by mouth every 6 (six) hours as needed for moderate pain or headache.    [provider]  allopurinol  (ZYLOPRIM ) 100 MG tablet Take 100 mg by mouth daily.    [provider]  amLODipine  (NORVASC ) 5 MG tablet Take 2 tablets (10 mg total) by mouth daily. 05/09/24   Danford, Lonni SQUIBB, MD  aspirin  EC 81 MG tablet Take 81 mg by mouth daily.    [provider]  docusate sodium  (COLACE) 100 MG capsule Take 1 capsule (100 mg total) by mouth 2 (two) times daily as needed for mild constipation. 05/09/24   Danford, Lonni SQUIBB, MD  feeding supplement (ENSURE PLUS HIGH PROTEIN) LIQD Take 237 mLs by mouth 3 (three) times daily between meals. 05/09/24   Danford, Lonni SQUIBB, MD  furosemide  (LASIX ) 40 MG tablet Take 40 mg by mouth daily. 06/25/23   [provider]  gabapentin  (NEURONTIN ) 100 MG capsule Take 300 mg by mouth 3 (three) times daily.    [provider]  lactulose (CHRONULAC) 10 GM/15ML solution Take 20 g by mouth 3 (three) times daily.    [provider]  leptospermum manuka honey (MEDIHONEY) PSTE paste Apply 1 Application topically daily.  05/09/24   Danford, Lonni SQUIBB, MD  losartan  (COZAAR ) 100 MG tablet Take 100 mg by mouth daily.    [provider]  magnesium  oxide (MAG-OX) 400 (240 Mg) MG tablet Take 400 mg by mouth 2 (two) times daily.    [provider]  Multiple Vitamin (MULTIVITAMIN WITH MINERALS) TABS tablet Take 1 tablet by mouth daily. 05/10/24   Danford, Lonni SQUIBB, MD  mycophenolate  (MYFORTIC ) 360 MG TBEC EC tablet Take 720 mg by mouth 2 (two) times daily.    [provider]  pantoprazole  (PROTONIX ) 40 MG tablet Take 40 mg by mouth daily before breakfast. 11/02/19   [provider]  predniSONE  (DELTASONE ) 5 MG tablet Take 5 mg by mouth daily.    [provider]  rivaroxaban  (XARELTO ) 2.5 MG TABS tablet Take 1 tablet (2.5 mg total) by mouth 2 (two) times daily. 11/09/22   Christia Budds, MD  rosuvastatin  (CRESTOR ) 20 MG tablet Take 1 tablet (20 mg total) by mouth daily. 05/10/24   Danford, Lonni SQUIBB, MD  senna (SENOKOT) 8.6 MG tablet Take 17.2 mg by mouth 2 (two) times daily.    [provider]  tacrolimus  (PROGRAF ) 1 MG capsule Take 2-3 mg by mouth See admin instructions. Give 3 capsules (3mg ) by mouth in the morning and give 2 capsules (2mg ) in the afternoon.    [provider]  tamsulosin  (FLOMAX ) 0.4 MG CAPS capsule Take 0.4 mg  by mouth daily. 08/28/19   [provider]  thiamine  (VITAMIN B1) 100 MG tablet Take 1 tablet (100 mg total) by mouth daily. 05/09/24   Danford, Lonni SQUIBB, MD    Allergies: Percocet [oxycodone -acetaminophen ], Vicodin [hydrocodone -acetaminophen ], Shellfish allergy, Chocolate flavoring agent (non-screening), Chocolate, and Tomato    Review of Systems  Musculoskeletal:  Positive for arthralgias.  Skin:  Positive for wound.    Updated Vital Signs BP 131/74   Pulse 72   Temp 98.1 F (36.7 C)   Resp 18   Ht 5' 7 (1.702 m)   Wt 72.6 kg   SpO2 100%   BMI 25.06 kg/m   Physical Exam Vitals and nursing note  reviewed.  Constitutional:      General: He is not in acute distress.    Appearance: Normal appearance. He is well-developed.  HENT:     Head: Normocephalic and atraumatic.     Right Ear: External ear normal.     Left Ear: External ear normal.  Eyes:     Conjunctiva/sclera: Conjunctivae normal.  Cardiovascular:     Rate and Rhythm: Normal rate and regular rhythm.  Pulmonary:     Effort: Pulmonary effort is normal. No respiratory distress.  Abdominal:     Palpations: Abdomen is soft.     Tenderness: There is no abdominal tenderness.  Musculoskeletal:        General: Tenderness present. No swelling.     Cervical back: Neck supple.     Comments: Patient with tenderness to palpation of the posterior and medial left knee.  No obvious deformity, erythema, swelling, warmth, or ecchymosis.  Skin:    General: Skin is warm and dry.     Capillary Refill: Capillary refill takes less than 2 seconds.     Comments: Patient with chronic wound of the left heel, no purulent discharge, warmth, or erythema noted on exam.  Patient is neurovascularly intact with +2 dorsalis pedis pulses.  Neurological:     General: No focal deficit present.     Mental Status: He is alert and oriented to person, place, and time.  Psychiatric:        Mood and Affect: Mood normal.     (all labs ordered are listed, but only abnormal results are displayed) Labs Reviewed - No data to display  EKG: None  Radiology: VAS US  LOWER EXTREMITY VENOUS (DVT) (7a-7p) Result Date: 06/05/2024  Lower Venous DVT Study Patient Name:  Theodore Arellano  Date of Exam:   06/05/2024 Medical Rec #: [id number]      Accession #:    [id number] Date of Birth: October 25, 1960      Patient Gender: M Patient Age:   63 years Exam Location:  [facility] Procedure:      VAS US  LOWER EXTREMITY VENOUS (DVT) Referring Phys: ILEANA ECK --------------------------------------------------------------------------------  Indications: Pain.  Performing  Technologist: Elmarie Lindau, RVT  Examination Guidelines: A complete evaluation includes B-mode imaging, spectral Doppler, color Doppler, and power Doppler as needed of all accessible portions of each vessel. Bilateral testing is considered an integral part of a complete examination. Limited examinations for reoccurring indications may be performed as noted. The reflux portion of the exam is performed with the patient in reverse Trendelenburg.  +---------+---------------+---------+-----------+----------+--------------+ LEFT     CompressibilityPhasicitySpontaneityPropertiesThrombus Aging +---------+---------------+---------+-----------+----------+--------------+ CFV      Full           Yes      Yes                                 +---------+---------------+---------+-----------+----------+--------------+  SFJ      Full                                                        +---------+---------------+---------+-----------+----------+--------------+ FV Prox  Full                                                        +---------+---------------+---------+-----------+----------+--------------+ FV Mid   Full                                                        +---------+---------------+---------+-----------+----------+--------------+ FV DistalFull                                                        +---------+---------------+---------+-----------+----------+--------------+ PFV      Full                                                        +---------+---------------+---------+-----------+----------+--------------+ POP      Full           Yes      Yes                                 +---------+---------------+---------+-----------+----------+--------------+ PTV      Full                                                        +---------+---------------+---------+-----------+----------+--------------+ PERO     Full                                                         +---------+---------------+---------+-----------+----------+--------------+ Possible left lower extremity arterial bypass graft with increased velocities in the proximal portion up to 277 cm/sec and monophasic flow at the distal portion and outflow.    Summary: LEFT: - There is no evidence of deep vein thrombosis in the lower extremity.  - No cystic structure found in the popliteal fossa. - Patent left lower extremity arterial bypass graft with a stenosis at the proximal portion.  *See table(s) above for measurements and observations.    Preliminary    DG Knee Complete 4 Views Left Result Date: 06/05/2024 EXAM: 4 or more VIEW(S) XRAY OF THE LEFT KNEE 06/05/2024 12:37:00 PM COMPARISON: None available. CLINICAL HISTORY:  Pain. Reason for exam: pain; Per triage notes: Pt BIB [facility] from [facility]. Left foot and leg pain. Bed sores are present on leg and back. No hx of fall or trauma. Had wound care yesterday and pain started over night. 7 out of 10 pain. A\T\Ox4. Urinary catheter in place from; facility.; best obtainable due to pt's ROM and pain. FINDINGS: BONES AND JOINTS: No acute fracture. No focal osseous lesion. No joint dislocation. Small suprapatellar effusion. Chondrocalcinosis in medial and lateral compartments. Mild tricompartmental left knee osteoarthritis. SOFT TISSUES: Extensive arterial calcifications. IMPRESSION: 1. Small suprapatellar effusion. No acute osseous abnormality. 2. 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 Electronically signed by: Selinda Blue MD 06/05/2024 12:51 PM EDT RP Workstation: [id number]     Procedures   Medications Ordered in the ED - No data to display                                  Medical Decision Making Amount and/or Complexity of Data Reviewed Radiology: ordered.   This patient presents to the ED for concern of left posterior knee pain  differential diagnosis includes DVT, Baker's cyst, musculoskeletal pain, fracture, dislocation, cellulitis   Imaging Studies ordered:  I ordered imaging studies including left knee x-ray and left lower extremity DVT study I independently visualized and interpreted imaging which showed small suprapatellar effusion.  No acute osseous abnormality.  No evidence of DVT or cystic structure in the popliteal fossa.  Mild tricompartmental left knee osteoarthritis. I agree with the radiologist interpretation    Problem List / ED Course:  Considered for admission or further workup however patient's vital signs, physical exam, and imaging are reassuring.  Patient has no signs or symptoms concerning for DVT or infection.  Patient advised to alternate Tylenol  and Motrin  as needed for pain.  Patient to follow-up with his primary care if his symptoms persist for further evaluation and workup.  Patient given return precautions.  I feel patient is safe for discharge at this time.       Final diagnoses:  Acute pain of left knee    ED Discharge Orders     None          Francis Ileana LOISE DEVONNA 06/05/24 1445    Yolande Lamar BROCKS, MD 06/10/24 [phone]

## 2024-06-18 ENCOUNTER — Other Ambulatory Visit: Payer: Self-pay

## 2024-06-18 ENCOUNTER — Emergency Department (HOSPITAL_COMMUNITY)

## 2024-06-18 ENCOUNTER — Inpatient Hospital Stay (HOSPITAL_COMMUNITY)
Admission: EM | Admit: 2024-06-18 | Discharge: 2024-06-26 | DRG: 811 | Disposition: A | Discharge status: Skilled Nursing Facility | Source: Skilled Nursing Facility | Attending: Nephrology | Admitting: Nephrology

## 2024-06-18 DIAGNOSIS — Z885 Allergy status to narcotic agent status: Secondary | ICD-10-CM

## 2024-06-18 DIAGNOSIS — Z7960 Long term (current) use of unspecified immunomodulators and immunosuppressants: Secondary | ICD-10-CM

## 2024-06-18 DIAGNOSIS — N179 Acute kidney failure, unspecified: Secondary | ICD-10-CM | POA: Diagnosis present

## 2024-06-18 DIAGNOSIS — G4730 Sleep apnea, unspecified: Secondary | ICD-10-CM | POA: Diagnosis present

## 2024-06-18 DIAGNOSIS — I440 Atrioventricular block, first degree: Secondary | ICD-10-CM | POA: Diagnosis present

## 2024-06-18 DIAGNOSIS — E785 Hyperlipidemia, unspecified: Secondary | ICD-10-CM | POA: Diagnosis present

## 2024-06-18 DIAGNOSIS — B9683 Acinetobacter baumannii as the cause of diseases classified elsewhere: Secondary | ICD-10-CM | POA: Diagnosis present

## 2024-06-18 DIAGNOSIS — I480 Paroxysmal atrial fibrillation: Secondary | ICD-10-CM | POA: Diagnosis present

## 2024-06-18 DIAGNOSIS — Z833 Family history of diabetes mellitus: Secondary | ICD-10-CM

## 2024-06-18 DIAGNOSIS — I5022 Chronic systolic (congestive) heart failure: Secondary | ICD-10-CM | POA: Diagnosis present

## 2024-06-18 DIAGNOSIS — E1122 Type 2 diabetes mellitus with diabetic chronic kidney disease: Secondary | ICD-10-CM | POA: Diagnosis present

## 2024-06-18 DIAGNOSIS — Z91013 Allergy to seafood: Secondary | ICD-10-CM

## 2024-06-18 DIAGNOSIS — Z886 Allergy status to analgesic agent status: Secondary | ICD-10-CM

## 2024-06-18 DIAGNOSIS — Z91018 Allergy to other foods: Secondary | ICD-10-CM

## 2024-06-18 DIAGNOSIS — I255 Ischemic cardiomyopathy: Secondary | ICD-10-CM | POA: Diagnosis present

## 2024-06-18 DIAGNOSIS — K5900 Constipation, unspecified: Secondary | ICD-10-CM | POA: Diagnosis present

## 2024-06-18 DIAGNOSIS — F0390 Unspecified dementia without behavioral disturbance: Secondary | ICD-10-CM | POA: Diagnosis present

## 2024-06-18 DIAGNOSIS — D631 Anemia in chronic kidney disease: Secondary | ICD-10-CM | POA: Diagnosis present

## 2024-06-18 DIAGNOSIS — R627 Adult failure to thrive: Secondary | ICD-10-CM | POA: Diagnosis present

## 2024-06-18 DIAGNOSIS — Z751 Person awaiting admission to adequate facility elsewhere: Secondary | ICD-10-CM

## 2024-06-18 DIAGNOSIS — I442 Atrioventricular block, complete: Secondary | ICD-10-CM | POA: Diagnosis present

## 2024-06-18 DIAGNOSIS — L97429 Non-pressure chronic ulcer of left heel and midfoot with unspecified severity: Secondary | ICD-10-CM | POA: Diagnosis present

## 2024-06-18 DIAGNOSIS — N186 End stage renal disease: Secondary | ICD-10-CM | POA: Diagnosis present

## 2024-06-18 DIAGNOSIS — N453 Epididymo-orchitis: Secondary | ICD-10-CM | POA: Diagnosis present

## 2024-06-18 DIAGNOSIS — Z23 Encounter for immunization: Secondary | ICD-10-CM | POA: Diagnosis present

## 2024-06-18 DIAGNOSIS — M1120 Other chondrocalcinosis, unspecified site: Secondary | ICD-10-CM | POA: Diagnosis not present

## 2024-06-18 DIAGNOSIS — Z940 Kidney transplant status: Secondary | ICD-10-CM | POA: Diagnosis not present

## 2024-06-18 DIAGNOSIS — L89154 Pressure ulcer of sacral region, stage 4: Secondary | ICD-10-CM | POA: Diagnosis present

## 2024-06-18 DIAGNOSIS — N183 Chronic kidney disease, stage 3 unspecified: Secondary | ICD-10-CM | POA: Diagnosis not present

## 2024-06-18 DIAGNOSIS — I2510 Atherosclerotic heart disease of native coronary artery without angina pectoris: Secondary | ICD-10-CM | POA: Diagnosis present

## 2024-06-18 DIAGNOSIS — E119 Type 2 diabetes mellitus without complications: Secondary | ICD-10-CM | POA: Diagnosis not present

## 2024-06-18 DIAGNOSIS — N451 Epididymitis: Secondary | ICD-10-CM

## 2024-06-18 DIAGNOSIS — R319 Hematuria, unspecified: Secondary | ICD-10-CM | POA: Diagnosis present

## 2024-06-18 DIAGNOSIS — E11621 Type 2 diabetes mellitus with foot ulcer: Secondary | ICD-10-CM | POA: Diagnosis present

## 2024-06-18 DIAGNOSIS — N401 Enlarged prostate with lower urinary tract symptoms: Secondary | ICD-10-CM | POA: Diagnosis present

## 2024-06-18 DIAGNOSIS — T8619 Other complication of kidney transplant: Secondary | ICD-10-CM | POA: Diagnosis present

## 2024-06-18 DIAGNOSIS — Z978 Presence of other specified devices: Secondary | ICD-10-CM

## 2024-06-18 DIAGNOSIS — I132 Hypertensive heart and chronic kidney disease with heart failure and with stage 5 chronic kidney disease, or end stage renal disease: Secondary | ICD-10-CM | POA: Diagnosis present

## 2024-06-18 DIAGNOSIS — D75839 Thrombocytosis, unspecified: Secondary | ICD-10-CM | POA: Diagnosis not present

## 2024-06-18 DIAGNOSIS — S31000S Unspecified open wound of lower back and pelvis without penetration into retroperitoneum, sequela: Secondary | ICD-10-CM

## 2024-06-18 DIAGNOSIS — Z8249 Family history of ischemic heart disease and other diseases of the circulatory system: Secondary | ICD-10-CM

## 2024-06-18 DIAGNOSIS — D509 Iron deficiency anemia, unspecified: Principal | ICD-10-CM | POA: Diagnosis present

## 2024-06-18 DIAGNOSIS — Z66 Do not resuscitate: Secondary | ICD-10-CM | POA: Diagnosis present

## 2024-06-18 DIAGNOSIS — Y830 Surgical operation with transplant of whole organ as the cause of abnormal reaction of the patient, or of later complication, without mention of misadventure at the time of the procedure: Secondary | ICD-10-CM | POA: Diagnosis present

## 2024-06-18 DIAGNOSIS — M109 Gout, unspecified: Secondary | ICD-10-CM | POA: Diagnosis present

## 2024-06-18 DIAGNOSIS — I70212 Atherosclerosis of native arteries of extremities with intermittent claudication, left leg: Secondary | ICD-10-CM | POA: Diagnosis not present

## 2024-06-18 DIAGNOSIS — N452 Orchitis: Secondary | ICD-10-CM | POA: Diagnosis not present

## 2024-06-18 DIAGNOSIS — I69398 Other sequelae of cerebral infarction: Secondary | ICD-10-CM

## 2024-06-18 DIAGNOSIS — D649 Anemia, unspecified: Principal | ICD-10-CM | POA: Diagnosis present

## 2024-06-18 DIAGNOSIS — Z951 Presence of aortocoronary bypass graft: Secondary | ICD-10-CM

## 2024-06-18 DIAGNOSIS — E1151 Type 2 diabetes mellitus with diabetic peripheral angiopathy without gangrene: Secondary | ICD-10-CM | POA: Diagnosis present

## 2024-06-18 DIAGNOSIS — F17210 Nicotine dependence, cigarettes, uncomplicated: Secondary | ICD-10-CM | POA: Diagnosis present

## 2024-06-18 DIAGNOSIS — Z7952 Long term (current) use of systemic steroids: Secondary | ICD-10-CM

## 2024-06-18 DIAGNOSIS — S31000A Unspecified open wound of lower back and pelvis without penetration into retroperitoneum, initial encounter: Secondary | ICD-10-CM | POA: Diagnosis present

## 2024-06-18 DIAGNOSIS — Z7901 Long term (current) use of anticoagulants: Secondary | ICD-10-CM

## 2024-06-18 DIAGNOSIS — Z789 Other specified health status: Secondary | ICD-10-CM

## 2024-06-18 DIAGNOSIS — N189 Chronic kidney disease, unspecified: Secondary | ICD-10-CM | POA: Diagnosis not present

## 2024-06-18 DIAGNOSIS — Z1613 Resistance to carbapenem: Secondary | ICD-10-CM | POA: Diagnosis present

## 2024-06-18 DIAGNOSIS — Z79899 Other long term (current) drug therapy: Secondary | ICD-10-CM

## 2024-06-18 DIAGNOSIS — R338 Other retention of urine: Secondary | ICD-10-CM | POA: Diagnosis present

## 2024-06-18 DIAGNOSIS — Z7982 Long term (current) use of aspirin: Secondary | ICD-10-CM

## 2024-06-18 LAB — COMPREHENSIVE METABOLIC PANEL WITH GFR
ALT: 8 U/L (ref 0–44)
AST: 13 U/L — ABNORMAL LOW (ref 15–41)
Albumin: 2.2 g/dL — ABNORMAL LOW (ref 3.5–5.0)
Alkaline Phosphatase: 65 U/L (ref 38–126)
Anion gap: 12 (ref 5–15)
BUN: 37 mg/dL — ABNORMAL HIGH (ref 8–23)
CO2: 19 mmol/L — ABNORMAL LOW (ref 22–32)
Calcium: 8.5 mg/dL — ABNORMAL LOW (ref 8.9–10.3)
Chloride: 105 mmol/L (ref 98–111)
Creatinine, Ser: 3.1 mg/dL — ABNORMAL HIGH (ref 0.61–1.24)
GFR, Estimated: 22 mL/min — ABNORMAL LOW (ref >60)
Glucose, Bld: 121 mg/dL — ABNORMAL HIGH (ref 70–99)
Potassium: 4.4 mmol/L (ref 3.5–5.1)
Sodium: 136 mmol/L (ref 135–145)
Total Bilirubin: 0.4 mg/dL (ref 0.0–1.2)
Total Protein: 7.1 g/dL (ref 6.5–8.1)

## 2024-06-18 LAB — VITAMIN B12: Vitamin B-12: 451 pg/mL (ref 180–914)

## 2024-06-18 LAB — CBC
HCT: 20.2 % — ABNORMAL LOW (ref 39.0–52.0)
Hemoglobin: 6.1 g/dL — CL (ref 13.0–17.0)
MCH: 25.5 pg — ABNORMAL LOW (ref 26.0–34.0)
MCHC: 30.2 g/dL (ref 30.0–36.0)
MCV: 84.5 fL (ref 80.0–100.0)
Platelets: 429 K/uL — ABNORMAL HIGH (ref 150–400)
RBC: 2.39 MIL/uL — ABNORMAL LOW (ref 4.22–5.81)
RDW: 17.6 % — ABNORMAL HIGH (ref 11.5–15.5)
WBC: 9.7 K/uL (ref 4.0–10.5)
nRBC: 0 % (ref 0.0–0.2)

## 2024-06-18 LAB — POC OCCULT BLOOD, ED: Fecal Occult Bld: NEGATIVE

## 2024-06-18 LAB — IRON AND TIBC
Iron: 32 ug/dL — ABNORMAL LOW (ref 45–182)
Saturation Ratios: 18 % (ref 17.9–39.5)
TIBC: 174 ug/dL — ABNORMAL LOW (ref 250–450)
UIBC: 142 ug/dL

## 2024-06-18 LAB — PREPARE RBC (CROSSMATCH)

## 2024-06-18 LAB — RETICULOCYTES
Immature Retic Fract: 13 % (ref 2.3–15.9)
RBC.: 2.15 MIL/uL — ABNORMAL LOW (ref 4.22–5.81)
Retic Count, Absolute: 49.2 K/uL (ref 19.0–186.0)
Retic Ct Pct: 2.3 % (ref 0.4–3.1)

## 2024-06-18 LAB — FERRITIN: Ferritin: 308 ng/mL (ref 24–336)

## 2024-06-18 LAB — FOLATE: Folate: 15.8 ng/mL (ref >5.9)

## 2024-06-18 LAB — CBG MONITORING, ED: Glucose-Capillary: 111 mg/dL — ABNORMAL HIGH (ref 70–99)

## 2024-06-18 MED ORDER — SODIUM CHLORIDE 0.9% IV SOLUTION: Freq: Once | INTRAVENOUS | Status: AC

## 2024-06-18 MED ORDER — ASPIRIN 81 MG PO TBEC
81.0000 mg | DELAYED_RELEASE_TABLET | Freq: Every day | ORAL | Status: DC
Start: 2024-06-19 — End: 2024-06-19

## 2024-06-18 MED ORDER — ROSUVASTATIN CALCIUM 20 MG PO TABS
20.0000 mg | ORAL_TABLET | Freq: Every day | ORAL | Status: DC
  Administered 2024-06-19 – 2024-06-26 (×8): 20 mg via ORAL
  Filled 2024-06-18 (×9): qty 1

## 2024-06-18 MED ORDER — AMLODIPINE BESYLATE 5 MG PO TABS
5.0000 mg | ORAL_TABLET | Freq: Every day | ORAL | Status: DC
  Administered 2024-06-19 – 2024-06-26 (×9): 5 mg via ORAL
  Filled 2024-06-18 (×9): qty 1

## 2024-06-18 MED ORDER — TAMSULOSIN HCL 0.4 MG PO CAPS
0.4000 mg | ORAL_CAPSULE | Freq: Every day | ORAL | Status: DC
Start: 2024-06-19 — End: 2024-06-19
  Administered 2024-06-19: 0.4 mg via ORAL
  Filled 2024-06-18: qty 1

## 2024-06-18 MED ORDER — MYCOPHENOLATE SODIUM 180 MG PO TBEC
720.0000 mg | DELAYED_RELEASE_TABLET | Freq: Two times a day (BID) | ORAL | Status: DC
  Administered 2024-06-19 – 2024-06-24 (×12): 720 mg via ORAL
  Filled 2024-06-18 (×13): qty 4

## 2024-06-18 MED ORDER — ALLOPURINOL 100 MG PO TABS
100.0000 mg | ORAL_TABLET | Freq: Every day | ORAL | Status: DC
  Administered 2024-06-19 – 2024-06-26 (×8): 100 mg via ORAL
  Filled 2024-06-18 (×9): qty 1

## 2024-06-18 MED ORDER — FERROUS SULFATE 325 (65 FE) MG PO TABS
325.0000 mg | ORAL_TABLET | Freq: Every day | ORAL | Status: DC
  Administered 2024-06-19 – 2024-06-26 (×8): 325 mg via ORAL
  Filled 2024-06-18 (×9): qty 1

## 2024-06-18 MED ORDER — ENOXAPARIN SODIUM 30 MG/0.3ML IJ SOSY
30.0000 mg | PREFILLED_SYRINGE | INTRAMUSCULAR | Status: DC
  Administered 2024-06-19: 30 mg via SUBCUTANEOUS
  Filled 2024-06-18: qty 0.3

## 2024-06-18 MED ORDER — PREDNISONE 5 MG PO TABS
5.0000 mg | ORAL_TABLET | Freq: Every day | ORAL | Status: DC
  Administered 2024-06-19 – 2024-06-26 (×8): 5 mg via ORAL
  Filled 2024-06-18 (×8): qty 1

## 2024-06-18 NOTE — Assessment & Plan Note (Addendum)
 Creatinine elevated to 3.1 on admission, likely related to poor oral intake as patient reports he has not been hydrating as much. Baseline creatinine appears about 1.8.  He is s/p renal transplant in 2011, currently on immunosuppressive therapy. Will reach out to nephrology for recommendations for further evaluation of AKI in post-transplant context.  Reassuringly, ultrasound renal transplant with Doppler showed stable transplant kidney with normal Doppler evaluation. - Avoid nephrotoxic agents - Held Losartan  and lasix  - Nephrology consulted, appreciate recommendations - UA - AM BMP

## 2024-06-18 NOTE — ED Triage Notes (Signed)
 Sent from [facility] for abnormal Hgb. Reports it was 6.9. denies GI bleeding.  Reports has a wound on his sacrum.

## 2024-06-18 NOTE — Assessment & Plan Note (Addendum)
 History of stage IV pressure wound ulcer.  Previously treated with antibiotics at SNF.  Patient experiencing ongoing discomfort due to wound.  Wound does not appear to be infected, patient afebrile, with normal WBC count. - WOC consulted - Placed wound care orders based on last WOC recommendations

## 2024-06-18 NOTE — ED Notes (Signed)
 Pt brief changed. Large stage 4 pressure injury noted to sacrum.

## 2024-06-18 NOTE — Assessment & Plan Note (Addendum)
 Patient presented with downtrending hemoglobin, currently 6.1 on admission.  Low iron  of 32, indicating some component of iron  deficiency anemia. Vitals are stable and within normal limits. Patient received 2 units of pRBCs.  - Admit to FMTS, attending McDiarmid - Vital signs per floor - Heart healthy diet - PT/OT to treat - VTE prophylaxis - AM CBC - Fall precautions - Transfuse if hemoglobin < 8 (hx of CAD)

## 2024-06-18 NOTE — ED Provider Notes (Signed)
 [place] EMERGENCY DEPARTMENT AT [facility] Provider Note   CSN: [id number] Arrival date & time: 06/18/24  1506     Patient presents with: Abnormal Labs   Theodore Arellano is a 63 y.o. male.   HPI     63 y.o. M with dementia, lives in LTC, hx renal transplant on tacro, MMF, prednisone , recent decubitus wound infection, CKD IIIa baseline 1.3-1.8, PVD, CAD s/p CABG x5 in 2020 and ischemic cardiomyopathy comes in with chief complaint of abnormal labs.  Patient was sent to the emergency room because his hemoglobin is just above 6.  Patient denies any bloody stools.  Patient reports that he has generalized weakness, but he lives at nursing home and is not very active.  He has been taking his medications as prescribed. He has had transfusion in the past.   He denies and bloody stools or abd pain.  Prior to Admission medications   Medication Sig Start Date End Date Taking? Authorizing Provider  acetaminophen  (TYLENOL ) 500 MG tablet Take 500 mg by mouth every 6 (six) hours as needed for moderate pain or headache.    [provider]  allopurinol  (ZYLOPRIM ) 100 MG tablet Take 100 mg by mouth daily.    [provider]  amLODipine  (NORVASC ) 5 MG tablet Take 2 tablets (10 mg total) by mouth daily. 05/09/24   Danford, Lonni SQUIBB, MD  aspirin  EC 81 MG tablet Take 81 mg by mouth daily.    [provider]  docusate sodium  (COLACE) 100 MG capsule Take 1 capsule (100 mg total) by mouth 2 (two) times daily as needed for mild constipation. 05/09/24   Danford, Lonni SQUIBB, MD  feeding supplement (ENSURE PLUS HIGH PROTEIN) LIQD Take 237 mLs by mouth 3 (three) times daily between meals. 05/09/24   Danford, Lonni SQUIBB, MD  furosemide  (LASIX ) 40 MG tablet Take 40 mg by mouth daily. 06/25/23   [provider]  gabapentin  (NEURONTIN ) 100 MG capsule Take 300 mg by mouth 3 (three) times daily.    [provider]  lactulose (CHRONULAC) 10 GM/15ML solution  Take 20 g by mouth 3 (three) times daily.    [provider]  leptospermum manuka honey (MEDIHONEY) PSTE paste Apply 1 Application topically daily. 05/09/24   Danford, Lonni SQUIBB, MD  losartan  (COZAAR ) 100 MG tablet Take 100 mg by mouth daily.    [provider]  magnesium  oxide (MAG-OX) 400 (240 Mg) MG tablet Take 400 mg by mouth 2 (two) times daily.    [provider]  Multiple Vitamin (MULTIVITAMIN WITH MINERALS) TABS tablet Take 1 tablet by mouth daily. 05/10/24   Danford, Lonni SQUIBB, MD  mycophenolate  (MYFORTIC ) 360 MG TBEC EC tablet Take 720 mg by mouth 2 (two) times daily.    [provider]  pantoprazole  (PROTONIX ) 40 MG tablet Take 40 mg by mouth daily before breakfast. 11/02/19   [provider]  predniSONE  (DELTASONE ) 5 MG tablet Take 5 mg by mouth daily.    [provider]  rivaroxaban  (XARELTO ) 2.5 MG TABS tablet Take 1 tablet (2.5 mg total) by mouth 2 (two) times daily. 11/09/22   Christia Budds, MD  rosuvastatin  (CRESTOR ) 20 MG tablet Take 1 tablet (20 mg total) by mouth daily. 05/10/24   Danford, Lonni SQUIBB, MD  senna (SENOKOT) 8.6 MG tablet Take 17.2 mg by mouth 2 (two) times daily.    [provider]  tacrolimus  (PROGRAF ) 1 MG capsule Take 2-3 mg by mouth See admin instructions. Give  3 capsules (3mg ) by mouth in the morning and give 2 capsules (2mg ) in the afternoon.    [provider]  tamsulosin  (FLOMAX ) 0.4 MG CAPS capsule Take 0.4 mg by mouth daily. 08/28/19   [provider]  thiamine  (VITAMIN B1) 100 MG tablet Take 1 tablet (100 mg total) by mouth daily. 05/09/24   Danford, Lonni SQUIBB, MD    Allergies: Percocet [oxycodone -acetaminophen ], Vicodin [hydrocodone -acetaminophen ], Shellfish allergy, Chocolate flavoring agent (non-screening), Chocolate, and Tomato    Review of Systems  All other systems reviewed and are negative.   Updated Vital Signs BP 119/70   Pulse 75   Temp 98.7 F (37.1  C) (Oral)   Resp 16   Ht 5' 7 (1.702 m)   Wt 72.5 kg   SpO2 98%   BMI 25.03 kg/m   Physical Exam Vitals and nursing note reviewed.  Constitutional:      Appearance: He is well-developed.  HENT:     Head: Atraumatic.  Cardiovascular:     Rate and Rhythm: Normal rate.  Pulmonary:     Effort: Pulmonary effort is normal.  Abdominal:     Palpations: Abdomen is soft.     Tenderness: There is no abdominal tenderness.  Musculoskeletal:     Cervical back: Neck supple.  Skin:    General: Skin is warm.  Neurological:     Mental Status: He is alert and oriented to person, place, and time.     (all labs ordered are listed, but only abnormal results are displayed) Labs Reviewed  COMPREHENSIVE METABOLIC PANEL WITH GFR - Abnormal; Notable for the following components:      Result Value   CO2 19 (*)    Glucose, Bld 121 (*)    BUN 37 (*)    Creatinine, Ser 3.10 (*)    Calcium  8.5 (*)    Albumin  2.2 (*)    AST 13 (*)    GFR, Estimated 22 (*)    All other components within normal limits  CBC - Abnormal; Notable for the following components:   RBC 2.39 (*)    Hemoglobin 6.1 (*)    HCT 20.2 (*)    MCH 25.5 (*)    RDW 17.6 (*)    Platelets 429 (*)    All other components within normal limits  IRON  AND TIBC - Abnormal; Notable for the following components:   Iron  32 (*)    TIBC 174 (*)    All other components within normal limits  RETICULOCYTES - Abnormal; Notable for the following components:   RBC. 2.15 (*)    All other components within normal limits  VITAMIN B12  FOLATE  FERRITIN  POC OCCULT BLOOD, ED  TYPE AND SCREEN  PREPARE RBC (CROSSMATCH)    EKG: EKG Interpretation Date/Time:  Thursday June 18 2024 15:16:17 EDT Ventricular Rate:  78 PR Interval:  212 QRS Duration:  92 QT Interval:  358 QTC Calculation: 408 R Axis:   41  Text Interpretation: with 1st degree A-V block ST & T wave abnormality, consider lateral ischemia Abnormal ECG When compared with  ECG of 02-May-2024 12:05, PREVIOUS ECG IS PRESENT Confirmed by Charlyn Sora [phone]) on 06/18/2024 5:03:42 PM  Radiology: No results found.   .Critical Care  Performed by: Charlyn Sora, MD Authorized by: Charlyn Sora, MD   Critical care provider statement:    Critical care time (minutes):  39   Critical care was necessary to treat or prevent imminent or life-threatening deterioration of the following conditions:  Circulatory failure   Critical care was time spent personally by me on the following activities:  Development of treatment plan with patient or surrogate, discussions with consultants, evaluation of patient's response to treatment, examination of patient, ordering and review of laboratory studies, ordering and review of radiographic studies, ordering and performing treatments and interventions, pulse oximetry, re-evaluation of patient's condition and review of old charts    Medications Ordered in the ED  0.9 %  sodium chloride  infusion (Manually program via Guardrails IV Fluids) ( Intravenous New Bag/Given 06/18/24 1717)                                    Medical Decision Making Amount and/or Complexity of Data Reviewed Labs: ordered. Radiology: ordered.  Risk Prescription drug management. Decision regarding hospitalization.   63 y.o. M comes into the emergency room with chief complaint of abnormal lab.  Patient has dementia, lives in long term care facility now, hx renal transplant on tacro, MMF, prednisone , recent decubitus wound infection, CKD IIIa baseline 1.3-1.8, PVD, CAD s/p CABG x5 in 2020 at [facility], ischemic cardiomyopathy   The complaint involves an extensive differential diagnosis and also carries with it a high risk of complications and morbidity.    The differential diagnosis includes : Acute blood loss anemia, anemia of chronic disease, worsening renal failure, GI bleed  The initial plan is to get basic labs, Hemoccult.   Additional history  obtained: Records reviewed previous admission documents and Care Everywhere/External Records.  Patient appears to be followed by [facility].  Baseline creatinine 1.7.  Recent admissions to [facility] in [facility].  Independent labs interpretation:  The following labs were independently interpreted: Patient has acute on chronic renal failure with creatinine that has bumped up over 3. Hemoglobin is indeed less than 7.  We will transfuse 2 units.  Patient has given consent for transfusion.   Treatment and Reassessment: I went to reassess the patient and performed a Hemoccult.  He was gone for ultrasound.  I requested admission already, and spoken to medicine team for admission. GI has not been consulted from the ER initially, subsequently found to be hemoccult neg. IP team will consult GI if needed.   Final diagnoses:  Symptomatic anemia  AKI (acute kidney injury) [facility])    ED Discharge Orders     None          Charlyn Sora, MD 06/22/24 1513

## 2024-06-18 NOTE — Assessment & Plan Note (Signed)
 Unclear reason for indwelling Foley catheter.  Catheter was pulled today, patient is continent of urine and not retaining at this time.  Does have BPH. - Continue Flomax  - Touch base with SNF when able about indwelling Foley - Monitor for signs of retention - Obtain postvoid residual

## 2024-06-18 NOTE — Assessment & Plan Note (Signed)
 Noted during his last admission, evaluated by cardiology.  He is not a candidate for transvenous pacing or pacemaker, and cardiology recommended transition to comfort care.  His CODE STATUS at that time was changed to DNR.  He is not currently in complete heart block.  He has suspected sleep apnea, and heart rates improved with nocturnal BiPAP. - Telemetry - CPAP at night, transition to BiPAP if necessary - Goals of care conversation

## 2024-06-18 NOTE — Assessment & Plan Note (Addendum)
 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 Paroxysmal atrial fibrillation - hold xarelto  (Cards note 05/18/2024 says not to anticoag) Unstable angina / CAD - aspirin  HTN - amlodipine  (reported 5 mg twice daily), started on 5 mg daily during this admission. Held other meds 2/2 AKI. PAD - aspirin  HLD - rosuvastatin  T2DM- last A1c 6.4, not on medications.  Start CBGs, discontinue if ranges are in goal.

## 2024-06-18 NOTE — Assessment & Plan Note (Deleted)
 Creatinine elevated to 3.1 on admission, likely related to poor oral intake as patient reports he has not been hydrating as much. Baseline creatinine appears about 1.8.  He is s/p renal transplant in 2011, currently on immunosuppressive therapy. Will reach out to nephrology for recommendations for further evaluation of AKI in post-transplant context. - Avoid nephrotoxic agents - Nephrology consulted, appreciate recommendations - AM BMP

## 2024-06-18 NOTE — ED Notes (Signed)
 Provider notified of critical hemoglobin 6.1

## 2024-06-18 NOTE — H&P (Cosign Needed Addendum)
 Hospital Admission History and Physical Service Pager: [phone]  Patient name: Theodore Arellano Medical record number: [id number] Date of Birth: 04/14/1961 Age: 63 y.o. Gender: male  Primary Care Provider: [name], MD  Consultants: Nephrology Code Status: FULL Preferred Emergency Contact:  Contact Information     Name Relation Home Work Mobile   Phillip,Caravello Son [phone]  [phone]   Burnetta Ubaldo Burrows [phone]  [phone]   Ronette Stacy Penman   [phone]      Other Contacts     Name Relation Home Work [place] Other   [phone]        Chief Complaint: symptomatic anemia  Differential and Medical Decision Making:  [name] is a 63 y.o. male presenting with symptomatic anemia. Differential for this patient's presentation of this include the following:   CKD - Patient has a history of ESRD and renal transplant (2011). Anemia may be due to decreased erythropoietin production.   Iron  deficiency anemia - Less likely, iron  studies more suggestive of anemia of chronic disease. Poor nutritional intake and failure to thrive.  Does have intermittent hematuria   GI bleed - Less likely as FOBT was negative. Patient also denies abdominal pain, bloody stools.   Hemolytic anemia - Less likely as no clinical signs such as jaundice or dark urine, and total bili/reticulocyte count is within normal limits.  Assessment & Plan Symptomatic anemia Patient presented with downtrending hemoglobin, currently 6.1 on admission.  Low iron  of 32, indicating some component of iron  deficiency anemia. Vitals are stable and within normal limits. Patient received 2 units of pRBCs.  - Admit to FMTS, attending McDiarmid - Vital signs per floor - Heart healthy diet - PT/OT to treat - VTE prophylaxis - AM CBC - Fall precautions - Transfuse if hemoglobin < 8 (hx of CAD) AKI (acute kidney injury) (HCC) History of renal transplant Creatinine  elevated to 3.1 on admission, likely related to poor oral intake as patient reports he has not been hydrating as much. Baseline creatinine appears about 1.8.  He is s/p renal transplant in 2011, currently on immunosuppressive therapy. Will reach out to nephrology for recommendations for further evaluation of AKI in post-transplant context.  Reassuringly, ultrasound renal transplant with Doppler showed stable transplant kidney with normal Doppler evaluation. - Avoid nephrotoxic agents - Held Losartan  and lasix  - Nephrology consulted, appreciate recommendations - UA - AM BMP Chronic indwelling Foley catheter Unclear reason for indwelling Foley catheter.  Catheter was pulled today, patient is continent of urine and not retaining at this time.  Does have BPH. 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  Previously treated with antibiotics at SNF.  Patient experiencing ongoing discomfort due to wound.  Wound does not appear to be infected, patient afebrile, with normal WBC count. - WOC consulted - Placed wound care orders based on last WOC recommendations Intermittent complete heart block (HCC) Sleep apnea Noted during his last admission, evaluated by cardiology.  He is not a candidate for transvenous pacing or pacemaker, and cardiology recommended transition to comfort care.  His CODE STATUS at that time was changed to DNR.  He is not currently in complete heart block.  He has suspected sleep apnea, and heart rates improved with nocturnal BiPAP. 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 Paroxysmal atrial fibrillation - hold xarelto  (Cards note 05/18/2024 says not to anticoag) Unstable angina / CAD - aspirin  HTN - amlodipine   (reported 5 mg twice daily), started on 5 mg daily during this admission. Held other meds 2/2 AKI. PAD - aspirin  HLD - rosuvastatin  T2DM- last A1c 6.4, not on medications.  Start CBGs, discontinue if ranges are in goal.  FEN/GI: Heart diet VTE Prophylaxis: Lovenox   Disposition: Med-Surg  History of Present Illness:  Theodore Arellano is a 63 y.o. male with a history of ESRD s/p kidney transplant, T2DM, multi-vessel CAD, PAD, HTN, HLD presenting from nursing facility with symptomatic anemia after being found to have an abnormal Hgb of 6.9. Patient reports he is having some left heel pain due to ulcer. He also has a chronic sacral ulcer. At his nursing facility, he was using a chronic indwelling foley, but states they removed it when he came to the hospital. Per chart review, patient noted to have history of hematuria, but denies hematuria, irritation with urination at the time of admission.   In the ED, labs notable for Hgb 6.1, Cr 3.10, BUN 37, Iron  32, TIBC 174. FOBT was negative. Patient underwent Renal Transplant US  which showed stable left lower quadrant transplant kidney with stable pelvic caliectasis. He was transfused 2 units of pRBCs.Nephrology was consulted given patient's renal transplant history.  Review Of Systems: Per HPI with the following additions: N/A  Pertinent Past Medical History: ESRD s/p kidney transplant Unstable angina s/p CABG Complete heart block Paroxysmal atrial fibrillation T2DM HTN PAD CAD - multivessel, severe HLD Remainder reviewed in history tab.   Pertinent Past Surgical History: Kidney transplant (2011) Left and right heart catherization (2013)  Remainder reviewed in history tab.   Pertinent Social History: Tobacco use: Yes Alcohol use: Denies Other Substance use: Denies Lives at [facility] SNF   Pertinent Family History: Mother - diabetes Father - HTN (deceased)  Important Outpatient Medications: Acetaminophen  500 mg q6h prn Allopurinol  100  mg daily Amlodipine  5 mg BID Aspirin  81 mg daily Colace 100 mg BID prn Ensure TID between meals Furosemide  40 mg daily Gabapentin  100 mg TID Lactulose 20 g TID Losartan  100 mg daily Magnesium  Oxide 400 mg BID Mycophenolate  720 mg BID Pantoprazole  40 mg daily Prednisone  5 mg daily Rivaroxaban  2.5 mg BID Rosuvastatin  20 mg daily Senna 8.6 mg BID Tacrolimus  3 mg AM + 2 mg PM Tamsulosin  0.4 mg daily Thiamine  100 mg daily   Objective: BP 119/76   Pulse 78   Temp 98.6 F (37 C) (Oral)   Resp 15   Ht 5' 7 (1.702 m)   Wt 72.5 kg   SpO2 90%   BMI 25.03 kg/m   Exam: General: elderly male, lying in bed, NAD Eyes: EOM grossly intact Cardiovascular: regular rate and rhythm; normal S1/S2 Respiratory: clear to auscultation bilaterally; normal respiratory effort Gastrointestinal: soft, non-distended MSK: ROM grossly intact Derm: Warm and dry; Left foot and heel wrapped; dressing across sacrum Neuro: A&Ox4 Psych: pleasant, appropriate affect  Labs:  CBC BMET  Recent Labs  Lab 06/18/24 1515  WBC 9.7  HGB 6.1*  HCT 20.2*  PLT 429*   Recent Labs  Lab 06/18/24 1515  NA 136  K 4.4  CL 105  CO2 19*  BUN 37*  CREATININE 3.10*  GLUCOSE 121*  CALCIUM  8.5*    Pertinent additional labs -Iron  32, TIBC 174, Ferritin 308 -Vit B12, folate, retic count wnl .  EKG: Appears sinus with RRR.   Imaging Studies Performed:  US  Renal Transplant w/ Doppler (06/18/24) Impression from Radiologist:  1. Stable left lower quadrant transplant kidney, with stable Pelvic caliectasis. 2. Normal Doppler evaluation    Mannie Ashley SAILOR, MD 06/18/2024, 10:01 PM PGY-1, [facility]  FPTS Intern pager: [phone], text pages welcome Secure chat group [facility] Teaching Service   Upper Level Addendum: I have seen and evaluated this patient along with Dr. mannie and reviewed the above note, making necessary revisions as appropriate. I agree with the  medical decision making and physical exam as noted above. Gladis Church, DO PGY-3 [facility] Family Medicine Residency

## 2024-06-18 NOTE — Assessment & Plan Note (Addendum)
 Noted during his last admission, evaluated by cardiology.  He is not a candidate for transvenous pacing or pacemaker, and cardiology recommended transition to comfort care.  His CODE STATUS at that time was changed to DNR.  He is not currently in complete heart block.  He has suspected sleep apnea, and heart rates improved with nocturnal BiPAP. - Telemetry - CPAP at night, transition to BiPAP if necessary - Goals of care conversation

## 2024-06-18 NOTE — ED Notes (Signed)
 Pt to [place].

## 2024-06-18 NOTE — Assessment & Plan Note (Deleted)
 Paroxysmal atrial fibrillation Unstable angina s/p CABG T2DM HTN PAD CAD HLD

## 2024-06-18 NOTE — H&P (Deleted)
 Hospital Admission History and Physical Service Pager: [phone]  Patient name: Theodore Arellano Medical record number: [id number] Date of Birth: 1961-07-16 Age: 63 y.o. Gender: male  Primary Care Provider: [name], MD  Consultants: Nephrology Code Status: FULL Preferred Emergency Contact:  Contact Information     Name Relation Home Work Mobile   Phillip,Orne Son [phone]  [phone]   Burnetta Ubaldo Burrows [phone]  [phone]   Ronette Stacy Penman   [phone]      Other Contacts     Name Relation Home Work [place] Other   [phone]        Chief Complaint: symptomatic anemia  Differential and Medical Decision Making:  Theodore Arellano is a 63 y.o. male presenting with symptomatic anemia. Differential for this patient's presentation of this include the following:  CKD - Patient has a history of ESRD and renal transplant (2011). Anemia may be due to decreased erythropoietin production or iron  deficiency.  Iron  deficiency anemia - Possible, given history of poor nutritional intake and failure to thrive.  Hematuria - History of hematuria, though patient currently denies hematuria, dysuria, or other urinary symptoms. Unlikely contributor to current anemia.  GI bleed - Less likely as FOBT was negative. Patient also denies abdominal pain, bloody stools.  Hemolytic anemia - Less likely as no clinical signs such as jaundice or dark urine, and reticulocyte count is within normal limits. Assessment & Plan Symptomatic anemia Patient presented with downtrending hemoglobin, currently 6.1 on admission.  Low iron  of 32, indicating some component of iron  deficiency anemia. Vitals are stable and within normal limits. Patient received 2 units of pRBCs.  - Admit to FMTS, attending McDiarmid - Vital signs per floor - Regular diet  - PT/OT to treat - VTE prophylaxis - AM CBC - Fall precautions - Transfuse if hemoglobin < 7 AKI (acute  kidney injury) (HCC) History of renal transplant Creatinine elevated to 3.1 on admission, likely related to poor oral intake as patient reports he has not been hydrating as much. Baseline creatinine appears about 1.8.  He is s/p renal transplant in 2011, currently on immunosuppressive therapy. Will reach out to nephrology for recommendations for further evaluation of AKI in post-transplant context. - Avoid nephrotoxic agents - Nephrology consulted, appreciate recommendations - AM BMP Intermittent complete heart block (HCC)  Sacral wound History of stage IV pressure wound ulcer.  Previously treated with antibiotics at SNF.  Patient experiencing ongoing discomfort due to wound.  Wound does not appear to be infected, patient afebrile, with normal WBC count. - WOC consulted Sleep apnea  Chronic health problem Paroxysmal atrial fibrillation Unstable angina s/p CABG T2DM HTN PAD CAD HLD  FEN/GI: Regular diet VTE Prophylaxis: Lovenox   Disposition: Med-Surg  History of Present Illness:  Theodore Arellano is a 63 y.o. male with a history of ESRD s/p kidney transplant, T2DM, multi-vessel CAD, PAD, HTN, HLD presenting from nursing facility with symptomatic anemia after being found to have an abnormal Hgb of 6.9. Patient reports he is having some left heel pain due to ulcer. He also has a chronic sacral ulcer. At his nursing facility, he was using a chronic indwelling foley, but states they removed it when he came to the hospital. Per chart review, patient noted to have history of hematuria, but denies hematuria, irritation with urination at the time of admission.  In the ED, labs notable for Hgb 6.1, Cr 3.10, BUN 37, Iron  32, TIBC 174. FOBT was negative. Patient underwent Renal Transplant  US  which showed stable left lower quadrant transplant kidney with stable pelvic caliectasis. He was transfused 2 units of pRBCs.Nephrology was consulted given patient's renal transplant history.  Review Of Systems:  Per HPI with the following additions: N/A  Pertinent Past Medical History: ESRD s/p kidney transplant Unstable angina s/p CABG Complete heart block Paroxysmal atrial fibrillation T2DM HTN PAD CAD - multivessel, severe HLD Remainder reviewed in history tab.   Pertinent Past Surgical History: Kidney transplant (2011) Left and right heart catherization (2013)  Remainder reviewed in history tab.   Pertinent Social History: Tobacco use: Yes Alcohol use: Denies Other Substance use: Denies Lives at [facility] SNF   Pertinent Family History: Mother - diabetes Father - HTN (deceased)  .  Important Outpatient Medications: Acetaminophen  500 mg q6h prn Allopurinol  100 mg daily Amlodipine  5 mg BID Aspirin  81 mg daily Colace 100 mg BID prn Ensure TID between meals Furosemide  40 mg daily Gabapentin  100 mg TID Lactulose 20 g TID Losartan  100 mg daily Magnesium  Oxide 400 mg BID Mycophenolate  720 mg BID Pantoprazole  40 mg daily Prednisone  5 mg daily Rivaroxaban  2.5 mg BID Rosuvastatin  20 mg daily Senna 8.6 mg BID Tacrolimus  3 mg AM + 2 mg PM Tamsulosin  0.4 mg daily Thiamine  100 mg daily   Objective: BP 119/70   Pulse 75   Temp 98.7 F (37.1 C) (Oral)   Resp 16   Ht 5' 7 (1.702 m)   Wt 72.5 kg   SpO2 98%   BMI 25.03 kg/m   Exam: General: elderly male, lying in bed, NAD Eyes: EOM grossly intact Cardiovascular: regular rate and rhythm; normal S1/S2 Respiratory: clear to auscultation bilaterally; normal respiratory effort Gastrointestinal: soft, non-distended MSK: ROM grossly intact Derm: Warm and dry; Left foot and heel wrapped; dressing across sacrum Neuro: A&Ox4 Psych: pleasant, appropriate affect  Labs:  CBC BMET  Recent Labs  Lab 06/18/24 1515  WBC 9.7  HGB 6.1*  HCT 20.2*  PLT 429*   Recent Labs  Lab 06/18/24 1515  NA 136  K 4.4  CL 105  CO2 19*  BUN 37*  CREATININE 3.10*  GLUCOSE 121*  CALCIUM  8.5*     Pertinent additional labs:   -Iron  32, TIBC 174, Ferritin 308 -Vit B12, folate, retic count wnl  EKG:    Imaging Studies Performed:  US  Renal Transplant w/ Doppler (06/18/24) Impression from Radiologist:  1. Stable left lower quadrant transplant kidney, with stable Pelvic caliectasis. 2. Normal Doppler evaluation    Mannie Ashley SAILOR, MD 06/18/2024, 7:37 PM PGY-1, [facility]  FPTS Intern pager: [phone], text pages welcome Secure chat group [facility] Teaching Service

## 2024-06-18 NOTE — Hospital Course (Addendum)
 Theodore Arellano is a 63 y.o.male with a history of ESRD s/p renal transplant, T2DM, multi-vessel CAD, complete heart block, PAD, HTN, HLD who was admitted to the [facility] Medicine Teaching Service at [facility] for symptomatic anemia after being found to have an Hgb < 7. His hospital course is detailed below:  Symptomatic anemia Patient presented from his nursing facility with symptomatic anemia after being found to have an abnormal Hgb of 6.9, which had down-trended to 6.1 by the time of admission.  Suspected secondary to chronic anemia of chronic renal disease.  He received at total of 2 units of pRBCs.  With appropriate response in hemoglobin.  Nephrology was consulted for below kidney injury and additionally started ESA per the patient.  Hemoglobin at time of discharge was 8.9.  Acute kidney injury On admission Cr was elevated to 3.1. Baseline creatinine appears about 1.8. Patient reporting poor oral intake and with history of failure to thrive. Patient is s/p renal transplant in 2011, currently on immunosuppressant therapy. Nephrology was consulted, they recommended continuing immunosuppressive therapy and supportive oral rehydration.  Creatinine steadily downtrended throughout admission with these measures, at discharge creatinine was 2.1.  Sacral wound ulcer Patient presented with chronic stage IV pressure injury ulcer. Wound care was consulted for management.  Orchitis and Epididymitis Pt presented with severe pain and swelling of the R testicle on 9/21. Urine culture grew Acinetobacter calcoaceticus/Baumanii complex resistant to Meropenem  and Zosyn , but sensitive to Unasyn . Consulted ID who suggested Unasyn  and descided with nephrology about holding Myfortic  for 7 days (restart on 10/1).    Other chronic conditions were medically managed with home medications and formulary alternatives as necessary (BPH, constipation, hypertension, gout)  PCP Follow-up Recommendations: Likely would benefit with  outpatient urology for BPH. Discharged on Lasix  40 mg Monday Wednesday Friday Hypertension-losartan  held secondary to AKI indefinitely per nephrology, blood pressure controlled at discharge only on amlodipine  Repeat CBC 1 week Ensure follow-up with nephrology A fib - Unclear if taking Xarelto  at SNF, per recent cardiology office note, decision made to not anticoagulate. Xarelto  held at discharge in the setting of anemia.

## 2024-06-18 NOTE — Assessment & Plan Note (Signed)
 History of stage IV pressure wound ulcer.  Previously treated with antibiotics at SNF.  Patient experiencing ongoing discomfort due to wound.  Wound does not appear to be infected, patient afebrile, with normal WBC count. - WOC consulted - Placed wound care orders based on last WOC recommendations

## 2024-06-18 NOTE — Assessment & Plan Note (Deleted)
 History of stage IV pressure wound ulcer.  Previously treated with antibiotics at SNF.  Patient experiencing ongoing discomfort due to wound.  Wound does not appear to be infected, patient afebrile, with normal WBC count. - WOC consulted

## 2024-06-18 NOTE — Assessment & Plan Note (Deleted)
 Patient presented with downtrending hemoglobin, currently 6.1 on admission.  Low iron  of 32, indicating some component of iron  deficiency anemia. Vitals are stable and within normal limits. Patient received 2 units of pRBCs.  - Admit to FMTS, attending McDiarmid - Vital signs per floor - Regular diet  - PT/OT to treat - VTE prophylaxis - AM CBC - Fall precautions - Transfuse if hemoglobin < 7

## 2024-06-19 ENCOUNTER — Encounter (HOSPITAL_COMMUNITY): Payer: Self-pay | Admitting: Student

## 2024-06-19 DIAGNOSIS — D649 Anemia, unspecified: Secondary | ICD-10-CM

## 2024-06-19 LAB — CBC
HCT: 25.2 % — ABNORMAL LOW (ref 39.0–52.0)
Hemoglobin: 8.2 g/dL — ABNORMAL LOW (ref 13.0–17.0)
MCH: 26.5 pg (ref 26.0–34.0)
MCHC: 32.5 g/dL (ref 30.0–36.0)
MCV: 81.6 fL (ref 80.0–100.0)
Platelets: 410 K/uL — ABNORMAL HIGH (ref 150–400)
RBC: 3.09 MIL/uL — ABNORMAL LOW (ref 4.22–5.81)
RDW: 16.5 % — ABNORMAL HIGH (ref 11.5–15.5)
WBC: 9.2 K/uL (ref 4.0–10.5)
nRBC: 0 % (ref 0.0–0.2)

## 2024-06-19 LAB — URINALYSIS, ROUTINE W REFLEX MICROSCOPIC
Bilirubin Urine: NEGATIVE
Glucose, UA: NEGATIVE mg/dL
Hgb urine dipstick: NEGATIVE
Ketones, ur: NEGATIVE mg/dL
Nitrite: NEGATIVE
Protein, ur: 30 mg/dL — AB
Specific Gravity, Urine: 1.01 (ref 1.005–1.030)
WBC, UA: >50 WBC/hpf (ref 0–5)
pH: 6 (ref 5.0–8.0)

## 2024-06-19 LAB — RENAL FUNCTION PANEL
Albumin: 2.1 g/dL — ABNORMAL LOW (ref 3.5–5.0)
Anion gap: 11 (ref 5–15)
BUN: 36 mg/dL — ABNORMAL HIGH (ref 8–23)
CO2: 20 mmol/L — ABNORMAL LOW (ref 22–32)
Calcium: 8.6 mg/dL — ABNORMAL LOW (ref 8.9–10.3)
Chloride: 105 mmol/L (ref 98–111)
Creatinine, Ser: 2.56 mg/dL — ABNORMAL HIGH (ref 0.61–1.24)
GFR, Estimated: 27 mL/min — ABNORMAL LOW (ref >60)
Glucose, Bld: 92 mg/dL (ref 70–99)
Phosphorus: 4.7 mg/dL — ABNORMAL HIGH (ref 2.5–4.6)
Potassium: 4 mmol/L (ref 3.5–5.1)
Sodium: 136 mmol/L (ref 135–145)

## 2024-06-19 LAB — BPAM RBC
Blood Product Expiration Date: 202510172359
Blood Product Expiration Date: 202510172359
ISSUE DATE / TIME: 202509181654
ISSUE DATE / TIME: 202509182100
Unit Type and Rh: 5100
Unit Type and Rh: 5100

## 2024-06-19 LAB — GLUCOSE, CAPILLARY
Glucose-Capillary: 120 mg/dL — ABNORMAL HIGH (ref 70–99)
Glucose-Capillary: 137 mg/dL — ABNORMAL HIGH (ref 70–99)
Glucose-Capillary: 92 mg/dL (ref 70–99)
Glucose-Capillary: 95 mg/dL (ref 70–99)
Glucose-Capillary: 110 mg/dL — ABNORMAL HIGH (ref 70–99)

## 2024-06-19 LAB — TYPE AND SCREEN
ABO/RH(D): O POS
Antibody Screen: NEGATIVE
Unit division: 0
Unit division: 0

## 2024-06-19 LAB — MRSA NEXT GEN BY PCR, NASAL: MRSA by PCR Next Gen: NOT DETECTED

## 2024-06-19 MED ORDER — TACROLIMUS 1 MG PO CAPS: 3.0000 mg | ORAL_CAPSULE | Freq: Every day | ORAL | Status: DC

## 2024-06-19 MED ORDER — TACROLIMUS 1 MG PO CAPS
2.0000 mg | ORAL_CAPSULE | ORAL | Status: DC
Start: 2024-06-19 — End: 2024-06-19

## 2024-06-19 MED ORDER — IRON SUCROSE 200 MG IVPB - SIMPLE MED
200.0000 mg | Freq: Once | Status: AC
  Administered 2024-06-19: 200 mg via INTRAVENOUS
  Filled 2024-06-19: qty 110

## 2024-06-19 MED ORDER — TAMSULOSIN HCL 0.4 MG PO CAPS
0.4000 mg | ORAL_CAPSULE | Freq: Once | ORAL | Status: AC
  Administered 2024-06-19: 0.4 mg via ORAL
  Filled 2024-06-19: qty 1

## 2024-06-19 MED ORDER — TACROLIMUS 1 MG PO CAPS
2.0000 mg | ORAL_CAPSULE | Freq: Every day | ORAL | Status: DC
  Administered 2024-06-20 – 2024-06-25 (×6): 2 mg via ORAL
  Filled 2024-06-19 (×6): qty 2

## 2024-06-19 MED ORDER — TACROLIMUS 1 MG PO CAPS
3.0000 mg | ORAL_CAPSULE | Freq: Every day | ORAL | Status: DC
  Administered 2024-06-20 – 2024-06-26 (×7): 3 mg via ORAL
  Filled 2024-06-19 (×7): qty 3

## 2024-06-19 MED ORDER — TAMSULOSIN HCL 0.4 MG PO CAPS
0.8000 mg | ORAL_CAPSULE | Freq: Every day | ORAL | Status: DC
  Administered 2024-06-20 – 2024-06-26 (×7): 0.8 mg via ORAL
  Filled 2024-06-19 (×7): qty 2

## 2024-06-19 MED ORDER — ONDANSETRON 4 MG PO TBDP
4.0000 mg | ORAL_TABLET | Freq: Three times a day (TID) | ORAL | Status: DC | PRN
  Administered 2024-06-19: 4 mg via ORAL
  Filled 2024-06-19: qty 1

## 2024-06-19 MED ORDER — POLYETHYLENE GLYCOL 3350 17 G PO PACK
17.0000 g | PACK | Freq: Once | ORAL | Status: AC
  Administered 2024-06-19: 17 g via ORAL
  Filled 2024-06-19: qty 1

## 2024-06-19 MED ORDER — POLYETHYLENE GLYCOL 3350 17 G PO PACK
17.0000 g | PACK | Freq: Every day | ORAL | Status: DC | PRN
  Administered 2024-06-23: 17 g via ORAL
  Filled 2024-06-19: qty 1

## 2024-06-19 MED ORDER — MEDIHONEY WOUND/BURN DRESSING EX PSTE
1.0000 | PASTE | Freq: Every day | CUTANEOUS | Status: DC
  Administered 2024-06-19 – 2024-06-26 (×8): 1 via TOPICAL
  Filled 2024-06-19 (×2): qty 44

## 2024-06-19 MED ORDER — TACROLIMUS 1 MG PO CAPS
2.0000 mg | ORAL_CAPSULE | Freq: Every day | ORAL | Status: DC
Start: 2024-06-19 — End: 2024-06-19
  Administered 2024-06-19: 2 mg via ORAL
  Filled 2024-06-19: qty 2

## 2024-06-19 MED ORDER — INFLUENZA VIRUS VACC SPLIT PF (FLUZONE) 0.5 ML IM SUSY
0.5000 mL | PREFILLED_SYRINGE | INTRAMUSCULAR | Status: AC
Start: 2024-06-20 — End: 2024-06-20
  Administered 2024-06-20: 0.5 mL via INTRAMUSCULAR
  Filled 2024-06-19: qty 0.5

## 2024-06-19 NOTE — Assessment & Plan Note (Addendum)
 History of stage IV pressure wound ulcer.  Previously treated with antibiotics and wound care at [facility].  Wound looks improved from previous pictures, will continue wound care as instructed previously. - WOC consulted

## 2024-06-19 NOTE — Assessment & Plan Note (Addendum)
 Cr elevated to 3.1 on admission, reduced to 2.56 this AM, baseline ~1.8.  S/p renal transplant in 2011, currently on immunosuppressive therapy. Nephrology consulted for recommendations for further evaluation of AKI in post-transplant context.  Reassuringly, ultrasound renal transplant with Doppler showed stable transplant kidney with normal Doppler evaluation. UA non-concerning for infection or hematuria. - Avoid nephrotoxic agents - Held Losartan  and Lasix   - Nephrology consulted, appreciate recommendations - FEUrea study to determine location of injury  - AM RFP

## 2024-06-19 NOTE — Assessment & Plan Note (Addendum)
 Noted during his last admission, evaluated by cardiology.  He is not a candidate for transvenous pacing or pacemaker, and cardiology recommended transition to comfort care.  His CODE STATUS at that time was changed to DNR.  He is not currently in complete heart block.  He has suspected sleep apnea, and heart rates improved with nocturnal BiPAP. - Telemetry - CPAP at night, transition to BiPAP if necessary - Will pursue goals of care conversation

## 2024-06-19 NOTE — Progress Notes (Addendum)
 Daily Progress Note Intern Pager: [phone]  Patient name: Theodore Arellano Medical record number: [id number] Date of birth: Jun 11, 1961 Age: 63 y.o. Gender: male  Primary Care Provider: [name], MD Consultants: Wound care, Nephrology Code Status: Full  Pt Overview and Major Events to Date:  9/18: pt admitted to the FMTS  Medical Decision Making:  Theodore Arellano is a 63 y.o. male with a significant PMH including ESRD s/p kidney transplant, unable angina s/p CABG, complete heart block, paroxysmal A fib, HTN, severe CAD, and T2DM who was admitted to our service for symptomatic anemia.  Assessment & Plan Symptomatic anemia Hgb improved from 6.1 > 8.2 s/p 2u PRBC. Low iron  of 32, low TIBC 174, and ferritin 308 indicating likely anemia of chronic disease. - PT/OT to treat - Monitor for signs of bleeding - AM CBC - Transfuse if hemoglobin < 8 (hx of CAD) AKI (acute kidney injury) (HCC) History of renal transplant Cr elevated to 3.1 on admission, reduced to 2.56 this AM, baseline ~1.8.  S/p renal transplant in 2011, currently on immunosuppressive therapy. Nephrology consulted for recommendations for further evaluation of AKI in post-transplant context.  Reassuringly, ultrasound renal transplant with Doppler showed stable transplant kidney with normal Doppler evaluation. UA non-concerning for infection or hematuria. - Avoid nephrotoxic agents - Held Losartan  and Lasix   - Nephrology consulted, appreciate recommendations - FEUrea study to determine location of injury  - AM RFP Chronic indwelling Foley catheter Unclear reason for indwelling Foley catheter.  Postvoid residual 400 mL overnight.  Does have BPH. - Flomax  increased to 0.8 every day from 0.4. 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   Previously treated with antibiotics and wound care at [facility].  Wound looks improved from previous pictures, will continue wound care as instructed previously. - WOC consulted Intermittent complete heart block (HCC) Sleep apnea Noted during his last admission, evaluated by cardiology.  He is not a candidate for transvenous pacing or pacemaker, and cardiology recommended transition to comfort care.  His CODE STATUS at that time was changed to DNR.  He is not currently in complete heart block.  He has suspected sleep apnea, and heart rates improved with nocturnal BiPAP. 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 Paroxysmal atrial fibrillation - Held xarelto  (Cards note 05/18/2024 says not to anticoag) in the setting of asymptomatic anemia, but will confirm with SNF if he has been receiving this. Unstable angina / CAD - aspirin  at home in the setting of symptomatic anemia HTN - amlodipine  (reported 5 mg twice daily), started on Amlodipine  5 mg daily. Held other meds 2/2 AKI. PAD - aspirin  currently holding in the setting of symptomatic anemia HLD - rosuvastatin  20 mg daily T2DM- last A1c 6.4, not on medications.  Start CBGs, discontinue if ranges are in goal.   FEN/GI: heart healthy PPx: SCDs Dispo:SNF pending clinical improvement .   Subjective:  Pt states he is doing well. Reports mild pain to his sores on his back and foot. Does not think he needs any pain medication.   Objective: Temp:  [98.3 F (36.8 C)-99.6 F (37.6 C)] 99.6 F (37.6 C) (09/19 0436) Pulse Rate:  [75-85] 79 (09/19 0436)  Resp:  [11-30] 17 (09/19 0436) BP: (95-154)/(58-98) 124/79 (09/19 0436) SpO2:  [90 %-100 %] 100 % (09/19 0436) Weight:  [72.5 kg-75.5 kg] 75.5 kg (09/19 0024) Physical Exam: General: nontoxic appearing. NAD Cardiovascular: regular rate. Regular rhythm Respiratory: lungs clear to auscultation  bilaterally without wheezes or crackles Extremities: moving all limbs at baseline. No edema  Laboratory: Most recent CBC Lab Results  Component Value Date   WBC 9.2 06/19/2024   HGB 8.2 (L) 06/19/2024   HCT 25.2 (L) 06/19/2024   MCV 81.6 06/19/2024   PLT 410 (H) 06/19/2024   Most recent BMP    Latest Ref Rng & Units 06/19/2024    3:42 AM  BMP  Glucose 70 - 99 mg/dL 92   BUN 8 - 23 mg/dL 36   Creatinine 9.38 - 1.24 mg/dL 7.43   Sodium 864 - 854 mmol/L 136   Potassium 3.5 - 5.1 mmol/L 4.0   Chloride 98 - 111 mmol/L 105   CO2 22 - 32 mmol/L 20   Calcium  8.9 - 10.3 mg/dL 8.6     Imaging/Diagnostic Tests: Radiologist Impression:  ULTRASOUND OF RENAL TRANSPLANT WITH RENAL DOPPLER ULTRASOUND  IMPRESSION: 1. Stable left lower quadrant transplant kidney, with stable pelvicaliectasis. 2. Normal Doppler evaluation.  Post void residual: 400 ml  Casimir, Marsa DASEN, Medical Student 06/19/2024, 7:16 AM  MS4 AI, [place] FPTS Intern pager: [phone], text pages welcome Secure chat group [facility] Teaching Service   I have discussed the above with Student Doctor Casimir and agree with the documented plan. My edits for correction/addition/clarification are included above. Please see any attending notes.   Kathrine Melena, DO PGY-2,  Family Medicine 06/19/2024 12:27 PM

## 2024-06-19 NOTE — Assessment & Plan Note (Addendum)
 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 Paroxysmal atrial fibrillation - Held xarelto  (Cards note 05/18/2024 says not to anticoag) in the setting of asymptomatic anemia, but will confirm with SNF if he has been receiving this. Unstable angina / CAD - aspirin  at home in the setting of symptomatic anemia HTN - amlodipine  (reported 5 mg twice daily), started on Amlodipine  5 mg daily. Held other meds 2/2 AKI. PAD - aspirin  currently holding in the setting of symptomatic anemia HLD - rosuvastatin  20 mg daily T2DM- last A1c 6.4, not on medications.  Start CBGs, discontinue if ranges are in goal.

## 2024-06-19 NOTE — Consult Note (Signed)
 [facility] Renal Consultation Note  Requesting MD: Krystal McDiarmid, MD  Indication for Consultation:  AKI - resolving   Chief complaint: anemia noted on outpatient labs  HPI:  Theodore Arellano is a 63 y.o. male with a history of chronic kidney disease status post renal transplant (in 2011), hypertension, coronary artery disease, and prior stroke who presented to the hospital with abnormal labs.  He presented from [facility] for abnormal hemoglobin.  Per charting, it was 6.9.  He was reported to have a wound on his sacrum per the triage nurse in the ER.  Hemoglobin here was 6.1.  He was also found to have acute kidney injury with a creatinine of 3.1.  His creatinine on repeat the following morning was 2.56.  Nephrology is consulted for assistance with management of acute kidney injury.  Per charting, he is usually on Lasix  and losartan .  Creatinine trends as below.  Baseline creatinine 1.8-2.2.  He states that he had nausea and vomiting a few days ago.  No diarrhea.  He denies any shortness of breath.  He has been on the lasix  and the losartan  for a while.  Previously saw Dr. Douglass then Dr. Prescilla.    Creatinine, Ser  Date/Time Value Ref Range Status  06/19/2024 03:42 AM 2.56 (H) 0.61 - 1.24 mg/dL Final  90/81/7974 96:84 PM 3.10 (H) 0.61 - 1.24 mg/dL Final  91/90/7974 97:62 AM 1.60 (H) 0.61 - 1.24 mg/dL Final  91/91/7974 96:79 AM 1.82 (H) 0.61 - 1.24 mg/dL Final  91/92/7974 96:59 AM 2.08 (H) 0.61 - 1.24 mg/dL Final  91/93/7974 88:96 AM 2.17 (H) 0.61 - 1.24 mg/dL Final  91/94/7974 89:57 AM 2.33 (H) 0.61 - 1.24 mg/dL Final  91/95/7974 91:70 AM 2.06 (H) 0.61 - 1.24 mg/dL Final  91/96/7974 95:73 AM 2.51 (H) 0.61 - 1.24 mg/dL Final  91/97/7974 95:76 PM 2.45 (H) 0.61 - 1.24 mg/dL Final  91/97/7974 88:46 AM 2.32 (H) 0.61 - 1.24 mg/dL Final  97/79/7974 97:49 PM 1.94 (H) 0.61 - 1.24 mg/dL Final  89/78/7975 96:54 AM 1.58 (H) 0.61 - 1.24 mg/dL Final  89/79/7975 96:80 AM 1.63 (H)  0.61 - 1.24 mg/dL Final  89/80/7975 95:87 AM 1.86 (H) 0.61 - 1.24 mg/dL Final  89/81/7975 94:75 AM 1.89 (H) 0.61 - 1.24 mg/dL Final  89/82/7975 96:74 AM 2.01 (H) 0.61 - 1.24 mg/dL Final  89/83/7975 96:89 AM 1.89 (H) 0.61 - 1.24 mg/dL Final  89/84/7975 95:74 AM 1.80 (H) 0.61 - 1.24 mg/dL Final  89/85/7975 92:67 PM 1.93 (H) 0.61 - 1.24 mg/dL Final  96/70/7975 91:57 AM 1.58 (H) 0.61 - 1.24 mg/dL Final  97/89/7975 89:82 PM 1.52 (H) 0.61 - 1.24 mg/dL Final  97/91/7975 98:63 AM 1.51 (H) 0.61 - 1.24 mg/dL Final  97/92/7975 96:71 PM 1.43 (H) 0.61 - 1.24 mg/dL Final  95/91/7977 91:85 AM 1.39 (H) 0.61 - 1.24 mg/dL Final  95/92/7977 97:95 AM 1.25 (H) 0.61 - 1.24 mg/dL Final  95/93/7977 93:59 AM 1.32 (H) 0.61 - 1.24 mg/dL Final  95/94/7977 97:91 AM 1.35 (H) 0.61 - 1.24 mg/dL Final  95/95/7977 90:57 AM 1.41 (H) 0.61 - 1.24 mg/dL Final  95/96/7977 98:53 AM 1.33 (H) 0.61 - 1.24 mg/dL Final  95/97/7977 98:70 AM 1.51 (H) 0.61 - 1.24 mg/dL Final  95/98/7977 97:95 AM 1.52 (H) 0.61 - 1.24 mg/dL Final  96/68/7977 95:54 AM 1.26 (H) 0.61 - 1.24 mg/dL Final  96/69/7977 96:43 AM 1.50 (H) 0.61 - 1.24 mg/dL Final  96/70/7977 94:60 AM 1.43 (H) 0.61 -  1.24 mg/dL Final  96/89/7977 97:50 PM 1.60 (H) 0.61 - 1.24 mg/dL Final  96/89/7977 97:59 PM 1.64 (H) 0.61 - 1.24 mg/dL Final  96/93/7977 98:92 PM 1.55 (H) 0.61 - 1.24 mg/dL Final  87/87/7978 87:52 AM 1.37 (H) 0.61 - 1.24 mg/dL Final  95/85/7978 97:51 AM 1.46 (H) 0.61 - 1.24 mg/dL Final  95/86/7978 93:43 AM 1.74 (H) 0.61 - 1.24 mg/dL Final    Comment:    DELTA CHECK NOTED  01/11/2020 12:33 PM 3.04 (H) 0.61 - 1.24 mg/dL Final  97/90/7978 94:79 AM 1.36 (H) 0.61 - 1.24 mg/dL Final  97/91/7978 94:69 AM 1.81 (H) 0.61 - 1.24 mg/dL Final  97/92/7978 89:69 AM 2.46 (H) 0.61 - 1.24 mg/dL Final  97/93/7978 90:80 PM 3.60 (H) 0.61 - 1.24 mg/dL Final  91/68/7979 95:50 AM 1.20 0.61 - 1.24 mg/dL Final  91/69/7979 95:86 AM 1.29 (H) 0.61 - 1.24 mg/dL Final  91/70/7979 95:75 AM  0.98 0.61 - 1.24 mg/dL Final  96/98/7980 88:93 AM 1.24 0.61 - 1.24 mg/dL Final  96/98/7980 95:84 AM 1.30 (H) 0.61 - 1.24 mg/dL Final  97/71/7980 93:43 PM 1.29 (H) 0.61 - 1.24 mg/dL Final     PMHx:   Past Medical History:  Diagnosis Date   Abnormality of gait following cerebrovascular accident (CVA)    Anxiety    Arthritis    lower back (11/28/2017)   CAD (coronary artery disease)    Chronic lower back pain    H/O immunosuppressive therapy    chronic/notes 11/28/2017   History of gout    before kidney transport (11/28/2017)   Hyperlipidemia    Hypertension    Kidney disease    s/p kidney transplant 2011; not on dialysis now (11/28/2017)   Peripheral arterial disease (HCC)    Stroke (HCC)    Tobacco use     Past Surgical History:  Procedure Laterality Date   ABDOMINAL AORTOGRAM W/LOWER EXTREMITY Bilateral 12/29/2020   Procedure: ABDOMINAL AORTOGRAM W/LOWER EXTREMITY;  Surgeon: Gretta Lonni PARAS, MD;  Location: [facility] INVASIVE CV LAB;  Service: Cardiovascular;  Laterality: Bilateral;   ARTERIOVENOUS GRAFT PLACEMENT Right 04/2007   /notes 02/01/2011   AV FISTULA PLACEMENT Left 12/20/2005; 12/2006   thelbert 02/13/2011; thelbert 02/13/2011   AXILLARY-FEMORAL BYPASS GRAFT Right 01/03/2021   Procedure: RIGHT AXILLARY TO PROFUNDA FEMORAL BYPASS;  Surgeon: Oris Krystal FALCON, MD;  Location: [facility] OR;  Service: Vascular;  Laterality: Right;   AXILLARY-FEMORAL BYPASS GRAFT Right 07/16/2023   Procedure: REDO RIGHT SARAHANN MOMENT WITH PATCH ANGIOPLSTY RIGHT POPLITEAL ARTERY.;  Surgeon: Sheree Penne Lonni, MD;  Location: [facility] OR;  Service: Vascular;  Laterality: Right;   CARDIAC CATHETERIZATION     EMBOLECTOMY  07/16/2023   Procedure: EMBOLECTOMY RIGHT Axilliofemoral and femoral/popliteal bypass graft.;  Surgeon: Sheree Penne Lonni, MD;  Location: [facility] OR;  Service: Vascular;;   FEMORAL-POPLITEAL BYPASS GRAFT Right 01/03/2021   Procedure: RIGHT FEMORAL TO BELOW KNEE POPLITEAL  ARTERY BYPASS;  Surgeon: Oris Krystal FALCON, MD;  Location: [facility] OR;  Service: Vascular;  Laterality: Right;   FEMORAL-POPLITEAL BYPASS GRAFT Right 07/19/2023   Procedure: REDO RIGHT FEMORAL-POPLITEAL ARTERY BYPASS GRAFT;  Surgeon: Sheree Penne Lonni, MD;  Location: [facility] OR;  Service: Vascular;  Laterality: Right;   HD access procedures     INGUINAL HERNIA REPAIR Left    KIDNEY TRANSPLANT  2011   LEFT HEART CATH AND CORONARY ANGIOGRAPHY N/A 11/29/2017   Procedure: LEFT HEART CATH AND CORONARY ANGIOGRAPHY;  Surgeon: Anner Alm ORN, MD;  Location: [facility] INVASIVE CV LAB;  Service: Cardiovascular;  Laterality: N/A;   LEFT HEART CATH AND CORONARY ANGIOGRAPHY N/A 06/01/2019   Procedure: LEFT HEART CATH AND CORONARY ANGIOGRAPHY;  Surgeon: Verlin Lonni BIRCH, MD;  Location: [facility] INVASIVE CV LAB;  Service: Cardiovascular;  Laterality: N/A;   LEFT HEART CATHETERIZATION WITH CORONARY ANGIOGRAM  03/11/2012   Procedure: LEFT HEART CATHETERIZATION WITH CORONARY ANGIOGRAM;  Surgeon: Toribio JONELLE Fuel, MD;  Location: [facility] CATH LAB;  Service: Cardiovascular;;   LOWER EXTREMITY ANGIOGRAM Right 07/19/2023   Procedure: LOWER EXTREMITY ANGIOGRAM;  Surgeon: Sheree Penne Lonni, MD;  Location: [facility] OR;  Service: Vascular;  Laterality: Right;   PATCH ANGIOPLASTY Right 07/19/2023   Procedure: PATCH ANGIOPLASTY USING GEORGE BIOLOGIC PATCH;  Surgeon: Sheree Penne Lonni, MD;  Location: [facility] OR;  Service: Vascular;  Laterality: Right;   RIGHT HEART CATHETERIZATION N/A 03/11/2012   Procedure: RIGHT HEART CATH;  Surgeon: Toribio JONELLE Fuel, MD;  Location: [facility] CATH LAB;  Service: Cardiovascular;  Laterality: N/A;   THROMBECTOMY Right 12/2007   thelbert 02/01/2011   THROMBECTOMY / ARTERIOVENOUS GRAFT REVISION Left 12/2006   thelbert 02/13/2011   THROMBECTOMY / ARTERIOVENOUS GRAFT REVISION Right 07/2007; 10/2007;01/2008;   thelbert 01/31/2011; thelbert 02/01/2011; thelbert 01/31/2011   THROMBECTOMY AND REVISION OF ARTERIOVENTOUS (AV) GORETEX   GRAFT  03/2007 X 2   thelbert 02/01/2011   THROMBECTOMY OF BYPASS GRAFT FEMORAL- POPLITEAL ARTERY Right 07/19/2023   Procedure: THROMBECTOMY OF BYPASS GRAFT FEMORAL-POPLITEAL ARTERY;  Surgeon: Sheree Penne Lonni, MD;  Location: [facility] OR;  Service: Vascular;  Laterality: Right;    Family Hx:  Family History  Problem Relation Age of Onset   Hypertension Father    Diabetes Mother     Social History:  reports that he has been smoking cigarettes. He has a 20 pack-year smoking history. He has never used smokeless tobacco. He reports current alcohol use of about 12.0 standard drinks of alcohol per week. He reports that he does not use drugs.  Allergies:  Allergies  Allergen Reactions   Percocet [Oxycodone -Acetaminophen ] Hives and Itching   Vicodin [Hydrocodone -Acetaminophen ] Hives and Itching   Shellfish Allergy Hives   Chocolate Flavoring Agent (Non-Screening) Hives   Fish Allergy    Chocolate Hives   Tomato Hives    Medications: Prior to Admission medications   Medication Sig Start Date End Date Taking? Authorizing Provider  acetaminophen  (TYLENOL ) 500 MG tablet Take 500 mg by mouth every 12 (twelve) hours as needed for moderate pain (pain score 4-6) or headache.   Yes [provider]  allopurinol  (ZYLOPRIM ) 100 MG tablet Take 100 mg by mouth daily.   Yes [provider]  amLODipine  (NORVASC ) 5 MG tablet Take 2 tablets (10 mg total) by mouth daily. Patient taking differently: Take 5 mg by mouth 2 (two) times daily. 05/09/24  Yes Danford, Lonni SQUIBB, MD  aspirin  EC 81 MG tablet Take 81 mg by mouth daily.   Yes [provider]  collagenase  (SANTYL ) 250 UNIT/GM ointment Apply 1 Application topically daily.   Yes [provider]  docusate sodium  (COLACE) 100 MG capsule Take 1 capsule (100 mg total) by mouth 2 (two) times daily as needed for mild constipation. 05/09/24  Yes Danford, Lonni SQUIBB, MD  feeding supplement (ENSURE PLUS HIGH PROTEIN) LIQD  Take 237 mLs by mouth 3 (three) times daily between meals. 05/09/24  Yes Danford, Lonni SQUIBB, MD  ferrous sulfate  325 (65 FE) MG EC tablet Take 325 mg by mouth daily with breakfast.   Yes [provider]  furosemide  (LASIX )  40 MG tablet Take 40 mg by mouth daily. 06/25/23  Yes [provider]  gabapentin  (NEURONTIN ) 300 MG capsule Take 300 mg by mouth 3 (three) times daily.   Yes [provider]  lactulose (CHRONULAC) 10 GM/15ML solution Take 20 g by mouth 3 (three) times daily.   Yes [provider]  lidocaine  (LIDODERM ) 5 % Place 1 patch onto the skin daily. Remove & Discard patch within 12 hours or as directed by MD   Yes [provider]  losartan  (COZAAR ) 100 MG tablet Take 100 mg by mouth daily.   Yes [provider]  magnesium  oxide (MAG-OX) 400 (240 Mg) MG tablet Take 400 mg by mouth 2 (two) times daily.   Yes [provider]  Multiple Vitamin (MULTIVITAMIN WITH MINERALS) TABS tablet Take 1 tablet by mouth daily. 05/10/24  Yes Danford, Lonni SQUIBB, MD  mycophenolate  (MYFORTIC ) 360 MG TBEC EC tablet Take 720 mg by mouth 2 (two) times daily.   Yes [provider]  OXYGEN  Inhale 2 L into the lungs as needed.   Yes [provider]  pantoprazole  (PROTONIX ) 40 MG tablet Take 40 mg by mouth daily before breakfast. 11/02/19  Yes [provider]  predniSONE  (DELTASONE ) 5 MG tablet Take 5 mg by mouth daily.   Yes [provider]  rivaroxaban  (XARELTO ) 2.5 MG TABS tablet Take 1 tablet (2.5 mg total) by mouth 2 (two) times daily. 11/09/22  Yes Christia Budds, MD  rosuvastatin  (CRESTOR ) 20 MG tablet Take 1 tablet (20 mg total) by mouth daily. 05/10/24  Yes Danford, Lonni SQUIBB, MD  senna (SENOKOT) 8.6 MG tablet Take 17.2 mg by mouth 2 (two) times daily.   Yes [provider]  tacrolimus  (PROGRAF ) 1 MG capsule Take 2-3 mg by mouth See admin instructions. Give 3 capsules (3mg ) by mouth in the morning  and give 2 capsules (2mg ) in the afternoon.   Yes [provider]  tamsulosin  (FLOMAX ) 0.4 MG CAPS capsule Take 0.4 mg by mouth daily. 08/28/19  Yes [provider]  thiamine  (VITAMIN B1) 100 MG tablet Take 1 tablet (100 mg total) by mouth daily. 05/09/24  Yes Danford, Lonni SQUIBB, MD    I have reviewed the patient's current and reported prior to admission medications.  Labs:     Latest Ref Rng & Units 06/19/2024    3:42 AM 06/18/2024    3:15 PM 05/09/2024    2:37 AM  BMP  Glucose 70 - 99 mg/dL 92  878  91   BUN 8 - 23 mg/dL 36  37  23   Creatinine 0.61 - 1.24 mg/dL 7.43  6.89  8.39   Sodium 135 - 145 mmol/L 136  136  134   Potassium 3.5 - 5.1 mmol/L 4.0  4.4  4.4   Chloride 98 - 111 mmol/L 105  105  107   CO2 22 - 32 mmol/L 20  19  19    Calcium  8.9 - 10.3 mg/dL 8.6  8.5  9.4     Urinalysis    Component Value Date/Time   COLORURINE YELLOW 06/19/2024 0701   APPEARANCEUR CLOUDY (A) 06/19/2024 0701   LABSPEC 1.010 06/19/2024 0701   PHURINE 6.0 06/19/2024 0701   GLUCOSEU NEGATIVE 06/19/2024 0701   HGBUR NEGATIVE 06/19/2024 0701   BILIRUBINUR NEGATIVE 06/19/2024 0701   KETONESUR NEGATIVE 06/19/2024 0701   PROTEINUR 30 (A) 06/19/2024 0701   UROBILINOGEN 1.0 01/07/2014 0015   NITRITE NEGATIVE 06/19/2024 0701   LEUKOCYTESUR LARGE (A)  06/19/2024 0701     ROS:  Pertinent items noted in HPI and remainder of comprehensive ROS otherwise negative.  Physical Exam: Vitals:   06/19/24 1500 06/19/24 1632  BP: 122/78 131/80  Pulse: 74 75  Resp:    Temp: 98.2 F (36.8 C) (!) 97.5 F (36.4 C)  SpO2: 100% 100%     General:  adult male in bed in NAD HEENT: NCAT Eyes: EOMI sclera anicteric Neck: supple trachea midline  Heart: S1S2 no rub Lungs: clear to auscultation; normal work of breathing at rest  Abdomen: soft/nt/nd; obese habitus Extremities: no pitting edema; no cyanosis or clubbing Skin: left foot is wrapped with gauze - clean and intact dressing Neuro:  alert and oriented x 3 provides hx and follows commands; at least mild gross visual deficit noted Psych: normal mood and affect   Assessment/Plan:   # AKI  - Secondary in part to anemia  - Resolving with supportive care - I have ordered strict ins and outs and daily weights - Please hold losartan  now and on discharge.   - Please hold the lasix  for now.  He's been on this a while and will likely need back at perhaps a reduced frequency for now - consider MWF dosing on discharge  # Chronic kidney disease stage 3b - Baseline creatinine 1.8-2.2 - followed by Dr. Prescilla previously   # Acute on chronic anemia - Status post PRBCs x 1 unit on 9/18 - Packed red blood cells per primary team - Iron  deficiency - May need ESA as well if no contraindications - will reassess and consent tomorrow and anticipate will need outpatient ESA through our office, too, if started here  # Renal transplant - Continue prograf , myfortic , and prednisone  home doses - retimed his prograf  for 8am and 8 pm  # Hypertension - Controlled on current regimen   # Constipation - miralax  once scheduled and ordered daily as needed  Thank you for the consult.  Please do not hesitate to contact me with any questions regarding our patient   Katheryn JAYSON Saba 06/19/2024, 6:59 PM

## 2024-06-19 NOTE — Plan of Care (Signed)
   Problem: Education: Goal: Knowledge of General Education information will improve Description: Including pain rating scale, medication(s)/side effects and non-pharmacologic comfort measures Outcome: Progressing   Problem: Health Behavior/Discharge Planning: Goal: Ability to manage health-related needs will improve Outcome: Progressing   Problem: Clinical Measurements: Goal: Cardiovascular complication will be avoided Outcome: Progressing

## 2024-06-19 NOTE — Assessment & Plan Note (Addendum)
 Hgb improved from 6.1 > 8.2 s/p 2u PRBC. Low iron  of 32, low TIBC 174, and ferritin 308 indicating likely anemia of chronic disease. - PT/OT to treat - Monitor for signs of bleeding - AM CBC - Transfuse if hemoglobin < 8 (hx of CAD)

## 2024-06-19 NOTE — Consult Note (Signed)
 WOC Nurse Consult Note:  WOC consult performed remotely utilizing imaging and chart review Reason for Consult: sacral and heel ulcer Wound type:  Stage 4 pressure injury to Sacrum Unstageable pressure injury to L heel Pressure Injury POA: Yes Measurement: see nursing flow sheets Wound bed:  Sacrum: 100% red, moist L heel:  60% pink, dry, 40% yellow slough Drainage (amount, consistency, odor) see nursing flow sheets  Periwound: intact Dressing procedure/placement/frequency:   Sacrum: cleanse with Vashe (lawson # [phone]), pack with Vashe soaked gauze and cover with silicone foam dressing. Change daily.  L heel: cleanse with Vashe (lawson # [phone]), pat dry. Apply 1/4 thick layer of leptospermum honey to wound bed, top with silicone foam,  change daily. Ok to lift silicone foam to reapply Medihoney daily.    Off load pressure to bilateral heels using Prevalon Boots (lawson # [phone])  WOC team will not follow at this time, please re-consult if new needs arise.   Thank you,  Doyal Polite, RN, MSN, [facility] WOC Team [phone] (Available Mon-Fri 0700-1500)

## 2024-06-19 NOTE — Progress Notes (Signed)
 NEW ADMISSION NOTE   Arrival Method: ED stretcher Mental Orientation: AAOx4 Telemetry: 5M11 Assessment: Completed Skin: See flowsheet IV: LFA Pain: 0/10 Tubes: n/a Safety Measures: Safety Fall Prevention Plan has been given, discussed and signed Admission: Completed [facility] Orientation: Patient has been orientated to the room, unit and staff.  Family: none at bedside   Orders have been reviewed and implemented. Will continue to monitor the patient. Call light has been placed within reach and bed alarm has been activated.

## 2024-06-19 NOTE — Assessment & Plan Note (Addendum)
 Unclear reason for indwelling Foley catheter.  Postvoid residual 400 mL overnight.  Does have BPH. - Flomax  increased to 0.8 every day from 0.4. - Touch base with SNF when able about indwelling Foley - Monitor for signs of retention - Repeat PVR q3h  for 2 more occurences

## 2024-06-20 DIAGNOSIS — D631 Anemia in chronic kidney disease: Secondary | ICD-10-CM | POA: Diagnosis not present

## 2024-06-20 DIAGNOSIS — Z940 Kidney transplant status: Secondary | ICD-10-CM

## 2024-06-20 DIAGNOSIS — N189 Chronic kidney disease, unspecified: Secondary | ICD-10-CM | POA: Diagnosis not present

## 2024-06-20 DIAGNOSIS — D649 Anemia, unspecified: Secondary | ICD-10-CM | POA: Diagnosis not present

## 2024-06-20 LAB — CBC
HCT: 24.4 % — ABNORMAL LOW (ref 39.0–52.0)
Hemoglobin: 7.8 g/dL — ABNORMAL LOW (ref 13.0–17.0)
MCH: 26.6 pg (ref 26.0–34.0)
MCHC: 32 g/dL (ref 30.0–36.0)
MCV: 83.3 fL (ref 80.0–100.0)
Platelets: 405 K/uL — ABNORMAL HIGH (ref 150–400)
RBC: 2.93 MIL/uL — ABNORMAL LOW (ref 4.22–5.81)
RDW: 16.9 % — ABNORMAL HIGH (ref 11.5–15.5)
WBC: 8.3 K/uL (ref 4.0–10.5)
nRBC: 0 % (ref 0.0–0.2)

## 2024-06-20 LAB — GLUCOSE, CAPILLARY
Glucose-Capillary: 117 mg/dL — ABNORMAL HIGH (ref 70–99)
Glucose-Capillary: 104 mg/dL — ABNORMAL HIGH (ref 70–99)
Glucose-Capillary: 104 mg/dL — ABNORMAL HIGH (ref 70–99)

## 2024-06-20 LAB — RENAL FUNCTION PANEL
Albumin: 2 g/dL — ABNORMAL LOW (ref 3.5–5.0)
Anion gap: 12 (ref 5–15)
BUN: 37 mg/dL — ABNORMAL HIGH (ref 8–23)
CO2: 21 mmol/L — ABNORMAL LOW (ref 22–32)
Calcium: 8.8 mg/dL — ABNORMAL LOW (ref 8.9–10.3)
Chloride: 104 mmol/L (ref 98–111)
Creatinine, Ser: 2.24 mg/dL — ABNORMAL HIGH (ref 0.61–1.24)
GFR, Estimated: 32 mL/min — ABNORMAL LOW (ref >60)
Glucose, Bld: 82 mg/dL (ref 70–99)
Phosphorus: 4.8 mg/dL — ABNORMAL HIGH (ref 2.5–4.6)
Potassium: 4.5 mmol/L (ref 3.5–5.1)
Sodium: 137 mmol/L (ref 135–145)

## 2024-06-20 MED ORDER — ORAL CARE MOUTH RINSE: 15.0000 mL | OROMUCOSAL | Status: DC | PRN

## 2024-06-20 MED ORDER — DARBEPOETIN ALFA 40 MCG/0.4ML IJ SOSY
40.0000 ug | PREFILLED_SYRINGE | Freq: Once | INTRAMUSCULAR | Status: AC
  Administered 2024-06-20: 40 ug via SUBCUTANEOUS
  Filled 2024-06-20: qty 0.4

## 2024-06-20 MED ORDER — POLYETHYLENE GLYCOL 3350 17 G PO PACK
17.0000 g | PACK | Freq: Once | ORAL | Status: AC
  Administered 2024-06-20: 17 g via ORAL
  Filled 2024-06-20: qty 1

## 2024-06-20 MED ORDER — ACETAMINOPHEN 500 MG PO TABS
500.0000 mg | ORAL_TABLET | Freq: Four times a day (QID) | ORAL | Status: DC | PRN
  Administered 2024-06-20 – 2024-06-21 (×2): 500 mg via ORAL
  Filled 2024-06-20 (×2): qty 1

## 2024-06-20 NOTE — Assessment & Plan Note (Addendum)
 Cr elevated to 3.1 on admission, reduced to 2.24 this AM, baseline ~1.8.  S/p renal transplant in 2011, currently on immunosuppressive therapy.  Nephrology consulted for recommendations for further evaluation of AKI in post-transplant context.  Reassuringly, ultrasound renal transplant with doppler showed stable transplant kidney with normal doppler evaluation. UA non-concerning for infection or hematuria. - Avoid nephrotoxic agents - Held Losartan  and Lasix , will likely transition to Lasix  MWF on discharge and continue to hold Losartan  per nephro recs - Strict I/Os and daily weights - Nephrology consulted, appreciate recommendations - AM RFP

## 2024-06-20 NOTE — Progress Notes (Signed)
 This nurse paged and secured chatted Family Medicine at this time concerning the patient's telemetry order and PRN pain regiment. Both methods of communication unsuccessful at this time. No new orders placed.

## 2024-06-20 NOTE — Evaluation (Signed)
 Occupational Therapy Evaluation Patient Details Name: Theodore Arellano MRN: [id number] DOB: 11/09/1960 Today's Date: 06/20/2024   History of Present Illness   Pt is a 63 y.o. M who presents from SNF with abnormal labs. Hemoglobin here was 6.1 and also found to have AKI with creatinine of 6.9. PMH includes HLD, HTN, CKD III, PAD, systolic HF, blindness in R eye, CABG, gout, DVT, cocaine  abuse, critical lower limb ischemia.     Clinical Impressions Patient reports that he has been staying at SNF for the past 2 months and prior to that was living at home.  Since being at SNF, patient reporting that he has requires A for ADLs and has been mainly using w/c for functional mobility while at SNF.  Patient currently present with requiring mod  A for LB ADLs. Patient presents with decreased overall strength, activity tolerance and functional mobility at this time and would benefit from additional OT <3 hours/day to address intervention to address functional deficits in order for patient to increased Independent in ADLs.   If plan is discharge home, recommend the following:   A little help with walking and/or transfers;Assistance with feeding;Assistance with cooking/housework;Assist for transportation;A little help with bathing/dressing/bathroom     Functional Status Assessment   Patient has had a recent decline in their functional status and demonstrates the ability to make significant improvements in function in a reasonable and predictable amount of time.     Equipment Recommendations         Recommendations for Other Services         Precautions/Restrictions   Precautions Precautions: Fall;Other (comment) Recall of Precautions/Restrictions: Intact Precaution/Restrictions Comments: Sacral and L heel unstageable wounds Restrictions Weight Bearing Restrictions Per Provider Order: No     Mobility Bed Mobility Overal bed mobility: Needs Assistance Bed Mobility: Supine to Sit, Sit  to Supine     Supine to sit: Supervision Sit to supine: Supervision   General bed mobility comments: Increased time    Transfers Overall transfer level: Needs assistance Equipment used: Rolling walker (2 wheels) Transfers: Sit to/from Stand Sit to Stand: Contact guard assist                  Balance Overall balance assessment: Needs assistance Sitting-balance support: Feet supported Sitting balance-Leahy Scale: Good     Standing balance support: Bilateral upper extremity supported   Standing balance comment: reliant on BUE support                           ADL either performed or assessed with clinical judgement   ADL Overall ADL's : Needs assistance/impaired Eating/Feeding: Set up;Sitting;Bed level   Grooming: Wash/dry hands;Wash/dry face;Set up;Bed level   Upper Body Bathing: Bed level;Moderate assistance   Lower Body Bathing: Sit to/from stand;Moderate assistance   Upper Body Dressing : Minimal assistance;Sitting   Lower Body Dressing: Maximal assistance;Sitting/lateral leans                       Vision Patient Visual Report: No change from baseline       Perception         Praxis         Pertinent Vitals/Pain Pain Assessment Pain Assessment: 0-10 Pain Score: 5  Pain Location:  (sacrum) Pain Descriptors / Indicators: Grimacing Pain Intervention(s): Monitored during session     Extremity/Trunk Assessment Upper Extremity Assessment Upper Extremity Assessment: Generalized weakness   Lower Extremity Assessment Lower Extremity  Assessment: Defer to PT evaluation   Cervical / Trunk Assessment Cervical / Trunk Assessment: Normal   Communication Communication Communication: Impaired Factors Affecting Communication: Reduced clarity of speech   Cognition Arousal: Alert Behavior During Therapy: WFL for tasks assessed/performed                                 Following commands: Intact        Cueing  General Comments   Cueing Techniques: Verbal cues      Exercises     Shoulder Instructions      Home Living Family/patient expects to be discharged to:: Skilled nursing facility                                 Additional Comments: Has been at [facility] rehab for 3-4 weeks. Prior to this, pt reports staying with son      Prior Functioning/Environment Prior Level of Function : Needs assist       Physical Assist : ADLs (physical)   ADLs (physical): Grooming;Bathing;Dressing;Toileting;IADLs Mobility Comments: patient reports that he wasnt walked in a while and uses w/c for mobility ADLs Comments: reports was requiring assist for ADL's at SNF    OT Problem List: Decreased strength;Decreased safety awareness   OT Treatment/Interventions: Self-care/ADL training;Therapeutic exercise;DME and/or AE instruction;Therapeutic activities;Patient/family education      OT Goals(Current goals can be found in the care plan section)   Acute Rehab OT Goals OT Goal Formulation: With patient Time For Goal Achievement: 07/04/24 Potential to Achieve Goals: Good   OT Frequency:  Min 2X/week    Co-evaluation              AM-PAC OT 6 Clicks Daily Activity     Outcome Measure Help from another person eating meals?: A Little Help from another person taking care of personal grooming?: A Little Help from another person toileting, which includes using toliet, bedpan, or urinal?: A Lot Help from another person bathing (including washing, rinsing, drying)?: A Lot     6 Click Score: 10   End of Session Equipment Utilized During Treatment: Rolling walker (2 wheels) Nurse Communication: Mobility status  Activity Tolerance: Patient tolerated treatment well Patient left: in bed;with call bell/phone within reach  OT Visit Diagnosis: Unsteadiness on feet (R26.81);Muscle weakness (generalized) (M62.81)                Time: 8859-8787 OT Time  Calculation (min): 32 min Charges:  OT General Charges $OT Visit: 1 Visit OT Evaluation $OT Eval Moderate Complexity: 1 Mod OT Treatments $Self Care/Home Management : 8-22 mins  Lamarr Pouch OT/L  Lamarr JONETTA Pouch 06/20/2024, 3:57 PM

## 2024-06-20 NOTE — Progress Notes (Signed)
 This nurse to patient bedside with outgoing RN. Patient is laying in supine position to the R side. Bed is locked and lowered and call light is within reach. Patient was able to eat 100% of breakfast and request ice water for morning medications. No distress noted, denies any further needs at this time.

## 2024-06-20 NOTE — Assessment & Plan Note (Signed)
 History of stage IV pressure wound ulcer.  Previously treated with antibiotics and wound care at [facility].  Wound looks improved from previous pictures, will continue wound care as instructed previously. - WOC consulted

## 2024-06-20 NOTE — Assessment & Plan Note (Signed)
 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 Held other meds 2/2 AKI. BP bumped this morning.  PAD: ASA currently holding in the setting of symptomatic anemia HLD: Rosuvastatin  20 mg daily T2DM: last A1c 6.4, not on medications.  Monitor CBGs appear stable while on prednisone .

## 2024-06-20 NOTE — Assessment & Plan Note (Addendum)
 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 Held other meds 2/2 AKI. BP bumped this morning.  PAD: ASA currently holding in the setting of symptomatic anemia HLD: Rosuvastatin  20 mg daily T2DM: last A1c 6.4, not on medications.  Monitor CBGs appear stable while on prednisone .

## 2024-06-20 NOTE — Assessment & Plan Note (Signed)
 Unclear reason for indwelling Foley catheter. Does have BPH. - Flomax  0.8 mg daily - Repeat postvoid residual - Monitor for signs of retention

## 2024-06-20 NOTE — Assessment & Plan Note (Addendum)
 Hgb improved from 6.1 > 8.2 s/p 2u PRBC, this morning his Hgb 7.8.  Holding off on transfusion at this time, pending nephro conversation about ESA. - PT/OT to treat - Monitor for signs of bleeding - AM CBC - Transfuse if hemoglobin < 8 (hx of CAD) - Nephro considering ESA, will reassess and consent today, holding off on transfusion at this time

## 2024-06-20 NOTE — Assessment & Plan Note (Signed)
 Noted during his last admission, evaluated by cardiology.  He is not a candidate for transvenous pacing or pacemaker, and cardiology recommended transition to comfort care.  - Telemetry - CPAP at night, transition to BiPAP if necessary - Ongoing goals of care conversation

## 2024-06-20 NOTE — Assessment & Plan Note (Addendum)
 Noted during his last admission, evaluated by cardiology.  He is not a candidate for transvenous pacing or pacemaker, and cardiology recommended transition to comfort care.  At that time, code status was changed to DNR, however patient is adamant about full code at this time.  He is not currently in complete heart block.  He has suspected sleep apnea, and heart rates improved with nocturnal BiPAP. - Telemetry - CPAP at night, transition to BiPAP if necessary - Will pursue goals of care conversation

## 2024-06-20 NOTE — Assessment & Plan Note (Signed)
 Creatinine this a.m.***.  - Avoid nephrotoxic agents - Held Losartan  and Lasix , will likely transition to Lasix  MWF on discharge and continue to hold Losartan  per nephro recs - Strict I/Os and daily weights - Nephrology consulted, appreciate recommendations - AM RFP

## 2024-06-20 NOTE — Progress Notes (Signed)
 Daily Progress Note Intern Pager: [phone]  Patient name: Theodore Arellano Medical record number: [id number] Date of birth: [birth year] Age: 63 y.o. Gender: male  Primary Care Provider: [name], MD Consultants: Nephrology, wound care Code Status: Full Code  Pt Overview and Major Events to Date:  9/18: Admitted; 2u PRBC  Medical Decision Making: Theodore Arellano is a 63 yo male with past medical history including ESRD s/p kidney transplant on immunosuppressive's, unstable angina s/p CABG, complete heart block, paroxysmal A-fib, HTN, severe CAD, and T2DM who was admitted for symptomatic anemia. Assessment & Plan Symptomatic anemia Hgb improved from 6.1 > 8.2 s/p 2u PRBC, this morning his Hgb 7.8.  Holding off on transfusion at this time, pending nephro conversation about ESA. - PT/OT to treat - Monitor for signs of bleeding - AM CBC - Transfuse if hemoglobin < 8 (hx of CAD) - Nephro considering ESA, will reassess and consent today, holding off on transfusion at this time AKI (acute kidney injury) (HCC) History of renal transplant Cr elevated to 3.1 on admission, reduced to 2.24 this AM, baseline ~1.8.  S/p renal transplant in 2011, currently on immunosuppressive therapy.  Nephrology consulted for recommendations for further evaluation of AKI in post-transplant context.  Reassuringly, ultrasound renal transplant with doppler showed stable transplant kidney with normal doppler evaluation. UA non-concerning for infection or hematuria. - Avoid nephrotoxic agents - Held Losartan  and Lasix , will likely transition to Lasix  MWF on discharge and continue to hold Losartan  per nephro recs - Strict I/Os and daily weights - Nephrology consulted, appreciate recommendations - AM RFP Chronic indwelling Foley catheter Unclear reason for indwelling Foley catheter.  Postvoid residual 78 mL yesterday and not performed yesterday as ordered.  Does have BPH. 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  Previously treated with antibiotics and wound care at [facility].  Wound looks improved from previous pictures, will continue wound care as instructed previously. - WOC consulted Intermittent complete heart block (HCC) Sleep apnea Noted during his last admission, evaluated by cardiology.  He is not a candidate for transvenous pacing or pacemaker, and cardiology recommended transition to comfort care.  At that time, code status was changed to DNR, however patient is adamant about full code at this time.  He is not currently in complete heart block.  He has suspected sleep apnea, and heart rates improved with nocturnal BiPAP. 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 Held other meds 2/2 AKI. BP bumped this morning.  PAD: ASA currently holding in the setting of symptomatic anemia HLD: Rosuvastatin  20 mg daily T2DM: last A1c 6.4, not on medications.  Monitor CBGs appear stable while on prednisone .  FEN/GI: Heart healthy diet PPx: SCDs Dispo: Pending nephro recs and PT/OT  Subjective:  Theodore Arellano is doing well this morning, has no major concerns.  Feels like his sacral wound pain is irritated with movement but seems to  be at baseline for him.  Able to tolerate food well.  No issues with voiding at this time and is able to get out of bed to do so.  Objective: Temp:  [97.5 F (36.4 C)-99.1 F (37.3 C)] 98.2 F  (36.8 C) (09/20 0825) Pulse Rate:  [68-75] 74 (09/20 0825) Resp:  [17-19] 19 (09/20 0825) BP: (118-141)/(64-80) 141/69 (09/20 0825) SpO2:  [100 %] 100 % (09/20 0825) Physical Exam: General: Awake and Alert in NAD Cardiovascular: RRR. No M/R/G Respiratory: CTAB, normal WOB on RA. No wheezing, crackles, rhonchi, or diminished breath sounds. Abdomen: Soft, non-tender, non-distended. Bowel sounds normoactive Extremities: SCDs not on.  Able to move all extremities. No BLE edema, no deformities or significant joint findings.  Dressing C/D/I of her left ankle/heel  Skin: Warm and dry.  Chronic sacral wound Neuro: A&Ox4 (name, location, birthday, current date). No focal neurological deficits.  Laboratory: Most recent CBC Lab Results  Component Value Date   WBC 8.3 06/20/2024   HGB 7.8 (L) 06/20/2024   HCT 24.4 (L) 06/20/2024   MCV 83.3 06/20/2024   PLT 405 (H) 06/20/2024   Most recent BMP    Latest Ref Rng & Units 06/20/2024    6:57 AM  BMP  Glucose 70 - 99 mg/dL 82   BUN 8 - 23 mg/dL 37   Creatinine 9.38 - 1.24 mg/dL 7.75   Sodium 864 - 854 mmol/L 137   Potassium 3.5 - 5.1 mmol/L 4.5   Chloride 98 - 111 mmol/L 104   CO2 22 - 32 mmol/L 21   Calcium  8.9 - 10.3 mg/dL 8.8     Imaging/Diagnostic Tests: No new imaging.  Janna Ferrier, DO 06/20/2024, 1:05 PM  PGY-2, [facility] Family Medicine FPTS Intern pager: [phone], text pages welcome Secure chat group [facility] Teaching Service

## 2024-06-20 NOTE — Assessment & Plan Note (Signed)
 Nephrology initiating ESA yesterday.  Hemoglobin this a.m.*** - PT/OT to treat - Monitor for signs of bleeding - AM CBC - Transfuse if hemoglobin < 8 (hx of CAD) - ESA

## 2024-06-20 NOTE — Evaluation (Signed)
 Physical Therapy Evaluation Patient Details Name: Theodore Arellano MRN: [id number] DOB: [birth year] Today's Date: 06/20/2024  History of Present Illness  Pt is a 63 y.o. M who presents from SNF with abnormal labs. Hemoglobin here was 6.1 and also found to have AKI with creatinine of 6.9. PMH includes HLD, HTN, CKD III, PAD, systolic HF, blindness in R eye, CABG, gout, DVT, cocaine  abuse, critical lower limb ischemia.  Clinical Impression  Pt admitted from SNF. Pt received in right sidelying; reports he turns to right and left frequently for pressure relief off sacrum. Pt able to transition to edge of bed and to standing to RW with increased time, but without physical assist. Took a couple lateral steps up to head of bed, but was limited in further ambulation attempt due to complaints of L ankle pain. Pt will benefit from continued inpatient follow up therapy, <3 hours/day to address wound care, strengthening, and functional mobility.        If plan is discharge home, recommend the following: A little help with walking and/or transfers;A little help with bathing/dressing/bathroom;Assistance with cooking/housework;Assist for transportation;Help with stairs or ramp for entrance;Supervision due to cognitive status   Can travel by private vehicle   Yes    Equipment Recommendations Other (comment) (defer)  Recommendations for Other Services       Functional Status Assessment Patient has had a recent decline in their functional status and demonstrates the ability to make significant improvements in function in a reasonable and predictable amount of time.     Precautions / Restrictions Precautions Precautions: Fall;Other (comment) Recall of Precautions/Restrictions: Intact Precaution/Restrictions Comments: Sacral and L heel unstageable wounds Restrictions Weight Bearing Restrictions Per Provider Order: No      Mobility  Bed Mobility Overal bed mobility: Needs Assistance Bed Mobility: Supine  to Sit, Sit to Supine     Supine to sit: Supervision Sit to supine: Supervision   General bed mobility comments: Increased time    Transfers Overall transfer level: Needs assistance Equipment used: Rolling walker (2 wheels) Transfers: Sit to/from Stand Sit to Stand: Contact guard assist           General transfer comment: Slow to rise, able to stand up to RW and take a few lateral steps up to head of bed with increased time and increased trunk flexion observed    Ambulation/Gait               General Gait Details: deferred by pt due to pain  Stairs            Wheelchair Mobility     Tilt Bed    Modified Rankin (Stroke Patients Only)       Balance Overall balance assessment: Needs assistance Sitting-balance support: Feet supported Sitting balance-Leahy Scale: Good     Standing balance support: Bilateral upper extremity supported Standing balance-Leahy Scale: Poor Standing balance comment: reliant on BUE support                             Pertinent Vitals/Pain Pain Assessment Pain Assessment: Faces Faces Pain Scale: Hurts even more Pain Location: sacrum, L ankle (pt reports arthritis) Pain Descriptors / Indicators: Discomfort, Grimacing, Guarding Pain Intervention(s): Limited activity within patient's tolerance, Monitored during session, Repositioned    Home Living Family/patient expects to be discharged to:: Skilled nursing facility  Prior Function Prior Level of Function : Needs assist             Mobility Comments: Per report, pt mainly lays in the bed, can stand for short periods of time ADLs Comments: reports was requiring assist for ADL's at [facility]     Extremity/Trunk Assessment   Upper Extremity Assessment Upper Extremity Assessment: Defer to OT evaluation    Lower Extremity Assessment Lower Extremity Assessment: Generalized weakness    Cervical / Trunk Assessment Cervical /  Trunk Assessment: Normal  Communication   Communication Communication: Impaired Factors Affecting Communication: Reduced clarity of speech    Cognition Arousal: Alert Behavior During Therapy: WFL for tasks assessed/performed   PT - Cognitive impairments: No apparent impairments                         Following commands: Intact       Cueing Cueing Techniques: Verbal cues     General Comments      Exercises     Assessment/Plan    PT Assessment Patient needs continued PT services  PT Problem List Decreased strength;Decreased activity tolerance;Decreased mobility;Decreased balance;Decreased skin integrity;Pain       PT Treatment Interventions DME instruction;Gait training;Functional mobility training;Therapeutic activities;Therapeutic exercise;Balance training;Patient/family education    PT Goals (Current goals can be found in the Care Plan section)  Acute Rehab PT Goals Patient Stated Goal: less pain PT Goal Formulation: With patient Time For Goal Achievement: 07/04/24 Potential to Achieve Goals: Good    Frequency Min 1X/week     Co-evaluation               AM-PAC PT 6 Clicks Mobility  Outcome Measure Help needed turning from your back to your side while in a flat bed without using bedrails?: A Little Help needed moving from lying on your back to sitting on the side of a flat bed without using bedrails?: A Little Help needed moving to and from a bed to a chair (including a wheelchair)?: A Little Help needed standing up from a chair using your arms (e.g., wheelchair or bedside chair)?: A Little Help needed to walk in hospital room?: Total Help needed climbing 3-5 steps with a railing? : Total 6 Click Score: 14    End of Session   Activity Tolerance: Patient limited by pain Patient left: in bed;with call bell/phone within reach;with bed alarm set Nurse Communication: Mobility status;Other (comment) (dressing change needs) PT Visit Diagnosis:  Muscle weakness (generalized) (M62.81);Difficulty in walking, not elsewhere classified (R26.2);Pain Pain - Right/Left: Left Pain - part of body: Ankle and joints of foot (sacrum,)    Time: 9046-8989 PT Time Calculation (min) (ACUTE ONLY): 17 min   Charges:   PT Evaluation $PT Eval Low Complexity: 1 Low   PT General Charges $$ ACUTE PT VISIT: 1 Visit         Aleck Daring, PT, DPT Acute Rehabilitation Services Office [phone]   Theodore Arellano 06/20/2024, 11:59 AM

## 2024-06-20 NOTE — Progress Notes (Signed)
 [facility] Progress Note  Name: Theodore Arellano MRN: [id number] DOB: 08/03/1961  Chief Complaint:  Abnormal outpatient labs - anemia   Subjective:  He had 2.2 liters UOP over 9/19 as well as one unmeasured urine void.  He states that he is at a facility; he had been staying somewhere else before he was at the SNF but he thinks there is mold at that location so he doesn't want to go back. (He's ok with going back to the SNF).  He denies any active cancer or recent MI/CVA.  We discussed risks/benefits/indications for ESA and he consents to ESA.   Review of systems:  Denies shortness of breath or chest pain  Denies n/v  ---------------- Background on consult:  Theodore Arellano is a 63 y.o. male with a history of chronic kidney disease status post renal transplant (in 2011), hypertension, coronary artery disease, and prior stroke who presented to the hospital with abnormal labs.  He presented from [facility] for abnormal hemoglobin.  Per charting, it was 6.9.  He was reported to have a wound on his sacrum per the triage nurse in the ER.  Hemoglobin here was 6.1.  He was also found to have acute kidney injury with a creatinine of 3.1.  His creatinine on repeat the following morning was 2.56.  Nephrology is consulted for assistance with management of acute kidney injury.  Per charting, he is usually on Lasix  and losartan .  Creatinine trends as below.  Baseline creatinine 1.8-2.2.  He states that he had nausea and vomiting a few days ago.  No diarrhea.  He denies any shortness of breath.  He has been on the lasix  and the losartan  for a while.  Previously saw Dr. Douglass then Dr. Prescilla.      Intake/Output Summary (Last 24 hours) at 06/20/2024 1404 Last data filed at 06/20/2024 1140 Gross per 24 hour  Intake 940 ml  Output 2700 ml  Net -1760 ml    Vitals:  Vitals:   06/19/24 1632 06/19/24 2106 06/20/24 0609 06/20/24 0825  BP: 131/80 129/65 118/64 (!) 141/69  Pulse: 75 74 68  74  Resp:  18 17 19   Temp: (!) 97.5 F (36.4 C) 99.1 F (37.3 C) 98.1 F (36.7 C) 98.2 F (36.8 C)  TempSrc: Oral Oral Oral Oral  SpO2: 100% 100% 100% 100%  Weight:      Height:         Physical Exam:  General elderly male in bed in no acute distress HEENT normocephalic atraumatic extraocular movements intact sclera anicteric Neck supple trachea midline Lungs clear to auscultation bilaterally normal work of breathing at rest on room air Heart S1S2 no rub Abdomen soft nontender nondistended Extremities no edema  Psych normal mood and affect Neuro alert and oriented x 3 conversant and follows commands  Medications reviewed   Labs:     Latest Ref Rng & Units 06/20/2024    6:57 AM 06/19/2024    3:42 AM 06/18/2024    3:15 PM  BMP  Glucose 70 - 99 mg/dL 82  92  878   BUN 8 - 23 mg/dL 37  36  37   Creatinine 0.61 - 1.24 mg/dL 7.75  7.43  6.89   Sodium 135 - 145 mmol/L 137  136  136   Potassium 3.5 - 5.1 mmol/L 4.5  4.0  4.4   Chloride 98 - 111 mmol/L 104  105  105   CO2 22 - 32 mmol/L 21  20  19   Calcium  8.9 - 10.3 mg/dL 8.8  8.6  8.5      Assessment/Plan:    # AKI  - Secondary in part to anemia  - Resolving with supportive care - I have ordered strict ins and outs and daily weights - Please hold losartan  now and on discharge.   - For now will plan for resuming lasix  at 40 mg MWF dosing on discharge      # Chronic kidney disease stage 3b - Baseline creatinine 1.8-2.2 - followed by Dr. Prescilla previously and will need to establish with another provider at our office as she has retired   # Acute on chronic anemia - Status post PRBCs x 1 unit on 9/18 - Packed red blood cells per primary team - Iron  deficiency - Will give aranesp  40 mcg once now and weekly   # Renal transplant - Continue prograf , myfortic , and prednisone  home doses - retimed his prograf  for 8am and 8 pm   # Hypertension - Controlled on current regimen    # Constipation - miralax  once  scheduled and ordered daily as needed    Disposition - per primary team     Katheryn JAYSON Saba, MD 06/20/2024 2:24 PM

## 2024-06-20 NOTE — Progress Notes (Signed)
 Daily Progress Note Intern Pager: [phone]  Patient name: Theodore Arellano Medical record number: [id number] Date of birth: Mar 13, 1961 Age: 63 y.o. Gender: male  Primary Care Provider: [name], MD Consultants: Nephrology, wound care Code Status: Full code  Pt Overview and Major Events to Date:  9/18: Admitted; 2u PRBC   Medical Decision Making:  Jehu Mccauslin is a 63 yo male with past medical history including ESRD s/p kidney transplant on immunosuppressive's, unstable angina s/p CABG, complete heart block, paroxysmal A-fib, HTN, severe CAD, and T2DM who was admitted for symptomatic anemia and acute on chronic kidney injury.  Hemoglobin has improved s/p transfusion, with gradual improvement in AKI.  Now medically stable for discharge.*** Assessment & Plan Symptomatic anemia Nephrology initiating ESA yesterday.  Hemoglobin this a.m.*** - PT/OT to treat - Monitor for signs of bleeding - AM CBC - Transfuse if hemoglobin < 8 (hx of CAD) - ESA AKI (acute kidney injury) (HCC) History of renal transplant Creatinine this a.m.***.  - Avoid nephrotoxic agents - Held Losartan  and Lasix , will likely transition to Lasix  MWF on discharge and continue to hold Losartan  per nephro recs - Strict I/Os and daily weights - Nephrology consulted, appreciate recommendations - AM RFP Chronic indwelling Foley catheter Unclear reason for indwelling Foley catheter. Does have BPH. - Flomax  0.8 mg daily - Repeat postvoid residual - Monitor for signs of retention Sacral wound Chronic ulcer of left heel (HCC) History of stage IV pressure wound ulcer.  Previously treated with antibiotics and wound care at [facility].  Wound looks improved from previous pictures, will continue wound care as instructed previously. - WOC consulted Intermittent complete heart block (HCC) Sleep apnea Noted during his last admission, evaluated by cardiology.  He is not a candidate for transvenous pacing or pacemaker, and  cardiology recommended transition to comfort care.  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 Held other meds 2/2 AKI. BP bumped this morning.  PAD: ASA currently holding in the setting of symptomatic anemia HLD: Rosuvastatin  20 mg daily T2DM: last A1c 6.4, not on medications.  Monitor CBGs appear stable while on prednisone .   FEN/GI: Heart healthy diet PPx: SCDs Dispo:  Possible dc today  Subjective:  ***  Objective: Temp:  [98.1 F (36.7 C)-98.8 F (37.1 C)] 98.8 F (37.1 C) (09/20 2020) Pulse Rate:  [68-80] 74 (09/20 2020) Resp:  [16-19] 16 (09/20 2020) BP: (118-155)/(64-91) 155/91 (09/20 2020) SpO2:  [100 %] 100 % (09/20 2020) Weight:  [75.5 kg] 75.5 kg (09/20 0825) Physical Exam: General: *** Cardiovascular: *** Respiratory: *** Abdomen: *** Extremities: ***  Laboratory: Most recent CBC Lab Results  Component Value Date   WBC 8.3 06/20/2024   HGB 7.8 (L) 06/20/2024   HCT 24.4 (L) 06/20/2024   MCV 83.3 06/20/2024   PLT 405 (H) 06/20/2024   Most recent BMP    Latest Ref Rng & Units 06/20/2024    6:57 AM  BMP  Glucose 70 - 99 mg/dL 82   BUN 8 - 23 mg/dL 37   Creatinine 9.38 - 1.24 mg/dL 7.75   Sodium 864 - 854 mmol/L 137  Potassium 3.5 - 5.1 mmol/L 4.5   Chloride 98 - 111 mmol/L 104   CO2 22 - 32 mmol/L 21   Calcium  8.9 - 10.3 mg/dL 8.8     Imaging/Diagnostic Tests:  No new imaging.  Alba Sharper, MD 06/20/2024, 11:48 PM  PGY-3, [facility] FPTS Intern pager: [phone], text pages welcome Secure chat group [facility]  Teaching Service

## 2024-06-20 NOTE — Assessment & Plan Note (Addendum)
 Unclear reason for indwelling Foley catheter.  Postvoid residual 78 mL yesterday and not performed yesterday as ordered.  Does have BPH. - Flomax  0.8 mg daily - Will attempt to reach SNF to discuss reason for indwelling foley - Monitor for signs of retention with PVR q3h x 2 occurrences

## 2024-06-21 ENCOUNTER — Inpatient Hospital Stay (HOSPITAL_COMMUNITY)

## 2024-06-21 DIAGNOSIS — D631 Anemia in chronic kidney disease: Secondary | ICD-10-CM | POA: Diagnosis not present

## 2024-06-21 DIAGNOSIS — Z940 Kidney transplant status: Secondary | ICD-10-CM | POA: Diagnosis not present

## 2024-06-21 DIAGNOSIS — D649 Anemia, unspecified: Secondary | ICD-10-CM | POA: Diagnosis not present

## 2024-06-21 DIAGNOSIS — N189 Chronic kidney disease, unspecified: Secondary | ICD-10-CM | POA: Diagnosis not present

## 2024-06-21 LAB — CBC
HCT: 25.2 % — ABNORMAL LOW (ref 39.0–52.0)
Hemoglobin: 8 g/dL — ABNORMAL LOW (ref 13.0–17.0)
MCH: 26.3 pg (ref 26.0–34.0)
MCHC: 31.7 g/dL (ref 30.0–36.0)
MCV: 82.9 fL (ref 80.0–100.0)
Platelets: 417 K/uL — ABNORMAL HIGH (ref 150–400)
RBC: 3.04 MIL/uL — ABNORMAL LOW (ref 4.22–5.81)
RDW: 16.8 % — ABNORMAL HIGH (ref 11.5–15.5)
WBC: 8.7 K/uL (ref 4.0–10.5)
nRBC: 0 % (ref 0.0–0.2)

## 2024-06-21 LAB — GLUCOSE, CAPILLARY
Glucose-Capillary: 85 mg/dL (ref 70–99)
Glucose-Capillary: 120 mg/dL — ABNORMAL HIGH (ref 70–99)
Glucose-Capillary: 101 mg/dL — ABNORMAL HIGH (ref 70–99)

## 2024-06-21 LAB — RENAL FUNCTION PANEL
Albumin: 2.2 g/dL — ABNORMAL LOW (ref 3.5–5.0)
Anion gap: 12 (ref 5–15)
BUN: 34 mg/dL — ABNORMAL HIGH (ref 8–23)
CO2: 20 mmol/L — ABNORMAL LOW (ref 22–32)
Calcium: 9.2 mg/dL (ref 8.9–10.3)
Chloride: 106 mmol/L (ref 98–111)
Creatinine, Ser: 2.05 mg/dL — ABNORMAL HIGH (ref 0.61–1.24)
GFR, Estimated: 36 mL/min — ABNORMAL LOW (ref >60)
Glucose, Bld: 88 mg/dL (ref 70–99)
Phosphorus: 4.6 mg/dL (ref 2.5–4.6)
Potassium: 4.7 mmol/L (ref 3.5–5.1)
Sodium: 138 mmol/L (ref 135–145)

## 2024-06-21 MED ORDER — FUROSEMIDE 40 MG PO TABS: ORAL_TABLET | ORAL | Status: AC

## 2024-06-21 MED ORDER — SORBITOL 70 % SOLN
30.0000 mL | Freq: Once | Status: AC
  Administered 2024-06-21: 30 mL via ORAL
  Filled 2024-06-21: qty 30

## 2024-06-21 MED ORDER — FUROSEMIDE 40 MG PO TABS
40.0000 mg | ORAL_TABLET | ORAL | Status: DC
  Administered 2024-06-22 – 2024-06-26 (×2): 40 mg via ORAL
  Filled 2024-06-21 (×4): qty 1

## 2024-06-21 MED ORDER — SULFAMETHOXAZOLE-TRIMETHOPRIM 800-160 MG PO TABS
1.0000 | ORAL_TABLET | Freq: Two times a day (BID) | ORAL | Status: DC
  Administered 2024-06-21 – 2024-06-24 (×6): 1 via ORAL
  Filled 2024-06-21 (×7): qty 1

## 2024-06-21 MED ORDER — HYDROMORPHONE HCL 1 MG/ML IJ SOLN
1.0000 mg | INTRAMUSCULAR | Status: DC | PRN
  Administered 2024-06-21 – 2024-06-22 (×2): 1 mg via INTRAVENOUS
  Filled 2024-06-21 (×2): qty 1

## 2024-06-21 MED ORDER — ACETAMINOPHEN 500 MG PO TABS
1000.0000 mg | ORAL_TABLET | Freq: Four times a day (QID) | ORAL | Status: DC
  Administered 2024-06-21 (×2): 1000 mg via ORAL
  Filled 2024-06-21 (×5): qty 2

## 2024-06-21 MED ORDER — DIPHENHYDRAMINE HCL 50 MG/ML IJ SOLN: 25.0000 mg | Freq: Every day | INTRAMUSCULAR | Status: DC | PRN

## 2024-06-21 MED ORDER — TAMSULOSIN HCL 0.4 MG PO CAPS: 0.8000 mg | ORAL_CAPSULE | Freq: Every day | ORAL | Status: AC

## 2024-06-21 MED ORDER — AMLODIPINE BESYLATE 5 MG PO TABS: 5.0000 mg | ORAL_TABLET | Freq: Every day | ORAL | Status: AC

## 2024-06-21 NOTE — TOC Initial Note (Signed)
 Transition of Care [facility]) - Initial/Assessment Note    Patient Details  Name: Theodore Arellano MRN: [id number] Date of Birth: [birth year]  [facility]) CM/SW Contact:    Gwenn Julien Norris, [place] Phone Number: 06/21/2024, 8:52 AM  Clinical Narrative: Pt admitted from [facility] where he is a STR/SNF resident. Pt's son reports plan is for pt to return to [facility] for continued rehab. Spoke to [place] with [facility] who confirmed pt is able to return pending new auth. Per MD, pt stable for dc. Home and Community/[facility] auth request submitted, reference # [id number]. Will provide updates as available.   Julien Gwenn, MSW, LCSW [phone] (coverage)                   Expected Discharge Plan: Skilled Nursing Facility Barriers to Discharge: Insurance Authorization   Patient Goals and CMS Choice            Expected Discharge Plan and Services       Living arrangements for the past 2 months: Skilled Nursing Facility                                      Prior Living Arrangements/Services Living arrangements for the past 2 months: Skilled Nursing Facility Lives with:: Facility Resident Patient language and need for interpreter reviewed:: Yes        Need for Family Participation in Patient Care: Yes (Comment) Care giver support system in place?: Yes (comment)   Criminal Activity/Legal Involvement Pertinent to Current Situation/Hospitalization: No - Comment as needed  Activities of Daily Living   ADL Screening (condition at time of admission) Independently performs ADLs?: No Does the patient have a NEW difficulty with bathing/dressing/toileting/self-feeding that is expected to last >3 days?: No Does the patient have a NEW difficulty with getting in/out of bed, walking, or climbing stairs that is expected to last >3 days?: No Does the patient have a NEW difficulty with communication that is expected to last >3 days?: No Is the patient deaf or  have difficulty hearing?: No Does the patient have difficulty seeing, even when wearing glasses/contacts?: Yes Does the patient have difficulty concentrating, remembering, or making decisions?: Yes  Permission Sought/Granted Permission sought to share information with : Facility Industrial/product designer granted to share information with : Yes, Verbal Permission Granted              Emotional Assessment       Orientation: : Oriented to Self, Oriented to Place, Oriented to  Time, Fluctuating Orientation (Suspected and/or reported Sundowners) Alcohol / Substance Use: Not Applicable Psych Involvement: No (comment)  Admission diagnosis:  AKI (acute kidney injury) (HCC) [N17.9] Acute kidney injury (HCC) [N17.9] Symptomatic anemia [D64.9] Patient Active Problem List   Diagnosis Date Noted   Kidney transplant recipient 06/20/2024   Anemia of renal disease 06/20/2024   Sacral wound 06/18/2024   Acute kidney injury (HCC) 06/18/2024   Sleep apnea 06/18/2024   Chronic indwelling Foley catheter 06/18/2024   Chronic ulcer of left heel (HCC) 06/18/2024   Intermittent complete heart block (HCC) 05/07/2024   Protein-calorie malnutrition, severe 05/05/2024   CHB (complete heart block) (HCC) 05/03/2024   Vascular insufficiency of extremity 07/15/2023   Bradycardia 11/08/2022   HTN (hypertension) 11/08/2022   Hypomagnesemia 11/08/2022   Chronic systolic CHF (congestive heart failure) (HCC) 11/08/2022   Impaired functional mobility, balance, gait, and endurance 02/07/2021  Hypertension    Critical lower limb ischemia (HCC) 01/05/2021   Anticoagulant long-term use    PAD (peripheral artery disease) (HCC)    Leg pain, anterior, right 12/27/2020   Non-healing wound of right lower extremity 12/27/2020   Cellulitis of right lower extremity 12/27/2020   Arterial leg ulcer (HCC) 12/27/2020   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   Osteomyelitis of left fibula (HCC)  01/13/2020   Cellulitis of left lower extremity 01/11/2020   Sepsis (HCC) 01/11/2020   DM (diabetes mellitus) type II, controlled, with peripheral vascular disorder (HCC) 01/11/2020   Chronic health problem 01/11/2020   Sepsis due to cellulitis (HCC) 01/11/2020   ESRD (end stage renal disease) (HCC) 01/06/2020   AKI (acute kidney injury) (HCC) 11/08/2019   COVID-19 virus infection 11/08/2019   History of DVT (deep vein thrombosis) 11/08/2019   Hx of CABG 09/04/2019   Disorder of arteries and arterioles, unspecified (HCC) 07/21/2019   Wound of left leg 07/21/2019   Left leg pain 06/28/2019   Abnormal ECG 06/05/2019   Mobitz type I Wenckebach atrioventricular block 06/05/2019   Unstable angina pectoris due to coronary arteriosclerosis (HCC) 05/30/2019   Left ventricular dysfunction 01/08/2018   Coronary artery disease    Coronary artery disease due to lipid rich plaque    Elevated troponin    Tobacco abuse 09/02/2017   Chest pain 08/27/2016   History of renal transplant 08/27/2016   Essential hypertension 08/27/2016   HLD (hyperlipidemia) 08/27/2016   Type 2 diabetes mellitus with vascular disease (HCC) 08/27/2016   Abnormal nuclear stress test 11/02/2015   CKD (chronic kidney disease) 11/02/2015   Current smoker 11/02/2015   Dyslipidemia 11/02/2015   Immunosuppression (HCC) 09/02/2014   Rash 09/02/2014   Left groin pain 06/24/2013   Symptomatic anemia 05/21/2012   Leucopenia 05/21/2012   Personal history of immunosupression therapy 05/21/2012   PCP:  [name], MD Pharmacy:   [facility] [phone] - [place], [place] - [address] [place] [address] Phone: [phone] Fax: [phone]     Social Drivers of Health (SDOH) Social History: SDOH Screenings   Food Insecurity: No Food Insecurity (06/19/2024)  Housing: Low Risk  (06/19/2024)  Transportation Needs: No Transportation Needs (06/19/2024)  Recent Concern: Transportation  Needs - Unmet Transportation Needs (05/02/2024)  Utilities: Not At Risk (06/19/2024)  Tobacco Use: High Risk (06/19/2024)   SDOH Interventions:     Readmission Risk Interventions     No data to display

## 2024-06-21 NOTE — Progress Notes (Signed)
 [facility] Progress Note  Name: Theodore Arellano MRN: [id number] DOB: September 02, 1961  Chief Complaint:  Abnormal outpatient labs - anemia   Subjective:  He had 1.1 liters UOP charted over 9/20.  He's able to watch a foot ball game.  States that he hasn't had a BM for several days.  (None are charted this admission) and he's had a couple of doses of miralax .     Review of systems:    Denies shortness of breath or chest pain  Denies n/v  ---------------- Background on consult:  Theodore Arellano is a 63 y.o. male with a history of chronic kidney disease status post renal transplant (in 2011), hypertension, coronary artery disease, and prior stroke who presented to the hospital with abnormal labs.  He presented from [facility] for abnormal hemoglobin.  Per charting, it was 6.9.  He was reported to have a wound on his sacrum per the triage nurse in the ER.  Hemoglobin here was 6.1.  He was also found to have acute kidney injury with a creatinine of 3.1.  His creatinine on repeat the following morning was 2.56.  Nephrology is consulted for assistance with management of acute kidney injury.  Per charting, he is usually on Lasix  and losartan .  Creatinine trends as below.  Baseline creatinine 1.8-2.2.  He states that he had nausea and vomiting a few days ago.  No diarrhea.  He denies any shortness of breath.  He has been on the lasix  and the losartan  for a while.  Previously saw Dr. Douglass then Dr. Prescilla.      Intake/Output Summary (Last 24 hours) at 06/21/2024 1200 Last data filed at 06/21/2024 0729 Gross per 24 hour  Intake 340 ml  Output 550 ml  Net -210 ml    Vitals:  Vitals:   06/20/24 1626 06/20/24 2020 06/21/24 0530 06/21/24 0728  BP: (!) 143/69 (!) 155/91 122/63 (!) 148/90  Pulse: 80 74 74 72  Resp: 18 16 17 18   Temp: 98.1 F (36.7 C) 98.8 F (37.1 C) 98.4 F (36.9 C) 98.7 F (37.1 C)  TempSrc: Oral Oral Oral Oral  SpO2: 100% 100% 100% 100%  Weight:   76.2 kg    Height:         Physical Exam:    General elderly male in bed in no acute distress HEENT normocephalic atraumatic extraocular movements intact sclera anicteric Neck supple trachea midline Lungs clear to auscultation bilaterally normal work of breathing at rest on room air Heart S1S2 no rub Abdomen soft nontender nondistended Extremities no edema  Psych normal mood and affect Neuro alert and oriented x 3 conversant and follows commands  Medications reviewed   Labs:     Latest Ref Rng & Units 06/21/2024    3:59 AM 06/20/2024    6:57 AM 06/19/2024    3:42 AM  BMP  Glucose 70 - 99 mg/dL 88  82  92   BUN 8 - 23 mg/dL 34  37  36   Creatinine 0.61 - 1.24 mg/dL 7.94  7.75  7.43   Sodium 135 - 145 mmol/L 138  137  136   Potassium 3.5 - 5.1 mmol/L 4.7  4.5  4.0   Chloride 98 - 111 mmol/L 106  104  105   CO2 22 - 32 mmol/L 20  21  20    Calcium  8.9 - 10.3 mg/dL 9.2  8.8  8.6      Assessment/Plan:    # AKI  -  Secondary in part to anemia  - Resolving with supportive care - I have ordered strict ins and outs and daily weights - Please hold losartan  now and on discharge.   - For now, would plan for resuming lasix  at 40 mg MWF dosing on discharge.  Will re-order lasix  to start tomorrow if he's here      # Chronic kidney disease stage 3b - Baseline creatinine 1.8-2.2 - followed by Dr. Prescilla previously and will need to establish with another provider at our office as she has retired   # Acute on chronic anemia - Status post PRBCs x 1 unit on 9/18 - Packed red blood cells per primary team - Iron  deficiency - Gave aranesp  40 mcg once on 9/20 and weekly   # Renal transplant - Continue prograf , myfortic , and prednisone  home doses - retimed his prograf  for 8am and 8 pm   # Hypertension - Controlled on current regimen    # Constipation - Reached out to primary team  - Sorbitol  once.   - miralax  is ordered daily as needed    Disposition - Per primary team.  I have  requested nephrology follow-up for him within two weeks at our office at [facility]    Nephrology will sign off.  Please do not hesitate to contact me with any questions regarding our patient.    Theodore JAYSON Saba, MD 06/21/2024 12:16 PM

## 2024-06-21 NOTE — Discharge Summary (Shared)
 Family Medicine Teaching [facility] Discharge Summary  Patient name: Theodore Arellano Medical record number: [id number] Date of birth: 10/10/1960 Age: 63 y.o. Gender: male Date of Admission: 06/18/2024  Date of Discharge: 06/21/24 Admitting Physician: Gladis Church, DO  Primary Care Provider: [name], MD Consultants: Nephrology, Wound Care  Indication for Hospitalization: Symptomatic anemia  Discharge Diagnoses/Problem List:  Principal Problem for Admission: Symptomatic anemia Other Problems addressed during stay:  Principal Problem:   Symptomatic anemia Active Problems:   Chronic health problem   History of renal transplant   AKI (acute kidney injury) (HCC)   Intermittent complete heart block (HCC)   Sacral wound   Acute kidney injury (HCC)   Sleep apnea   Chronic indwelling Foley catheter   Chronic ulcer of left heel (HCC)   Kidney transplant recipient   Anemia of renal disease    Brief Hospital Course:  Theodore Arellano is a 63 y.o.male with a history of ESRD s/p renal transplant, T2DM, multi-vessel CAD, complete heart block, PAD, HTN, HLD who was admitted to the [facility] Medicine Teaching Service at [facility] for symptomatic anemia after being found to have an Hgb < 7. His hospital course is detailed below:  Symptomatic anemia Patient presented from his nursing facility with symptomatic anemia after being found to have an abnormal Hgb of 6.9, which had down-trended to 6.1 by the time of admission.  Suspected secondary to chronic anemia of chronic renal disease.  He received at total of 2 units of pRBCs.  With appropriate response in hemoglobin.  Nephrology was consulted for below kidney injury and additionally started ESA per the patient.  Hemoglobin at time of discharge was 8.0.  Acute kidney injury On admission Cr was elevated to 3.1. Baseline creatinine appears about 1.8. Patient reporting poor oral intake and with history of failure to thrive. Patient is s/p  renal transplant in 2011, currently on immunosuppressant therapy. Nephrology was consulted, they recommended continuing immunosuppressive therapy and supportive oral rehydration.  Creatinine steadily downtrended throughout admission with these measures, at discharge creatinine was 2.0.  Sacral wound ulcer Patient presented with chronic stage IV pressure injury ulcer. Wound care was consulted for management.  Other chronic conditions were medically managed with home medications and formulary alternatives as necessary (BPH, constipation, hypertension, gout)  PCP Follow-up Recommendations: Likely would benefit with outpatient urology for BPH. Discharged on Lasix  40 mg Monday Wednesday Friday Hypertension-losartan  held secondary to AKI indefinitely per nephrology, blood pressure controlled at discharge only on amlodipine  Repeat CBC 1 week Ensure follow-up with nephrology A fib - Unclear if taking Xarelto  at SNF, per recent cardiology office note, decision made to not anticoagulate. Xarelto  held at discharge in the setting of anemia.    Results/Tests Pending at Time of Discharge:  Unresulted Labs (From admission, onward)    None        Disposition: SNF  Discharge Condition: stable  Discharge Exam:  Vitals:   06/20/24 2020 06/21/24 0530  BP: (!) 155/91 122/63  Pulse: 74 74  Resp: 16 17  Temp: 98.8 F (37.1 C) 98.4 F (36.9 C)  SpO2: 100% 100%   General: NAD sleeping on side comfortably in hospital bed Neuro: A&O Cardiovascular: RRR, no murmurs,  Respiratory: normal WOB on RA, CTAB, no wheezes, ronchi or rales Abdomen: soft, NTTP, no rebound or guarding Extremities: Moving all 4 extremities equally, no peripheral edema  Significant Procedures: None  Significant Labs and Imaging:  Recent Labs  Lab 06/20/24 0657 06/21/24 0359  WBC  8.3 8.7  HGB 7.8* 8.0*  HCT 24.4* 25.2*  PLT 405* 417*   Recent Labs  Lab 06/20/24 0657 06/21/24 0359  NA 137 138  K 4.5 4.7  CL  104 106  CO2 21* 20*  GLUCOSE 82 88  BUN 37* 34*  CREATININE 2.24* 2.05*  CALCIUM  8.8* 9.2  PHOS 4.8* 4.6  ALBUMIN  2.0* 2.2*    US  Renal Transplant w/ Doppler 1. Stable left lower quadrant transplant kidney, with stable pelvicaliectasis. 2. Normal Doppler evaluation.   Discharge Medications:  Allergies as of 06/21/2024       Reactions   Percocet [oxycodone -acetaminophen ] Hives, Itching   Vicodin [hydrocodone -acetaminophen ] Hives, Itching   Shellfish Allergy Hives   Chocolate Flavoring Agent (non-screening) Hives   Fish Allergy    Chocolate Hives   Tomato Hives        Medication List     PAUSE taking these medications    losartan  100 MG tablet Wait to take this until your doctor or other care provider tells you to start again. Commonly known as: COZAAR  Take 100 mg by mouth daily.       STOP taking these medications    magnesium  oxide 400 (240 Mg) MG tablet Commonly known as: MAG-OX   Xarelto  2.5 MG Tabs tablet Generic drug: rivaroxaban        TAKE these medications    acetaminophen  500 MG tablet Commonly known as: TYLENOL  Take 500 mg by mouth every 12 (twelve) hours as needed for moderate pain (pain score 4-6) or headache.   allopurinol  100 MG tablet Commonly known as: ZYLOPRIM  Take 100 mg by mouth daily.   amLODipine  5 MG tablet Commonly known as: NORVASC  Take 1 tablet (5 mg total) by mouth daily. What changed: how much to take   aspirin  EC 81 MG tablet Take 81 mg by mouth daily.   collagenase  250 UNIT/GM ointment Commonly known as: SANTYL  Apply 1 Application topically daily.   docusate sodium  100 MG capsule Commonly known as: COLACE Take 1 capsule (100 mg total) by mouth 2 (two) times daily as needed for mild constipation.   feeding supplement Liqd Take 237 mLs by mouth 3 (three) times daily between meals.   ferrous sulfate  325 (65 FE) MG EC tablet Take 325 mg by mouth daily with breakfast.   furosemide  40 MG tablet Commonly known  as: LASIX  Take 40mg  on Monday, Wednesday and Friday. What changed:  how much to take how to take this when to take this additional instructions   gabapentin  300 MG capsule Commonly known as: NEURONTIN  Take 300 mg by mouth 3 (three) times daily.   lactulose 10 GM/15ML solution Commonly known as: CHRONULAC Take 20 g by mouth 3 (three) times daily.   lidocaine  5 % Commonly known as: LIDODERM  Place 1 patch onto the skin daily. Remove & Discard patch within 12 hours or as directed by MD   multivitamin with minerals Tabs tablet Take 1 tablet by mouth daily.   mycophenolate  360 MG Tbec EC tablet Commonly known as: MYFORTIC  Take 720 mg by mouth 2 (two) times daily.   OXYGEN  Inhale 2 L into the lungs as needed.   pantoprazole  40 MG tablet Commonly known as: PROTONIX  Take 40 mg by mouth daily before breakfast.   predniSONE  5 MG tablet Commonly known as: DELTASONE  Take 5 mg by mouth daily.   rosuvastatin  20 MG tablet Commonly known as: CRESTOR  Take 1 tablet (20 mg total) by mouth daily.   senna 8.6 MG tablet Commonly  known as: SENOKOT Take 17.2 mg by mouth 2 (two) times daily.   tacrolimus  1 MG capsule Commonly known as: PROGRAF  Take 2-3 mg by mouth See admin instructions. Give 3 capsules (3mg ) by mouth in the morning and give 2 capsules (2mg ) in the afternoon.   tamsulosin  0.4 MG Caps capsule Commonly known as: FLOMAX  Take 2 capsules (0.8 mg total) by mouth daily. What changed: how much to take   thiamine  100 MG tablet Commonly known as: VITAMIN B1 Take 1 tablet (100 mg total) by mouth daily.        Discharge Instructions: Please refer to Patient Instructions section of EMR for full details.  Patient was counseled important signs and symptoms that should prompt return to medical care, changes in medications, dietary instructions, activity restrictions, and follow up appointments.   Follow-Up Appointments:  Follow-up Information     [name], MD.  Schedule an appointment as soon as possible for a visit in 1 week(s).   Specialty: Family Medicine Contact information: [address] [place] [address] [phone]                   Alba Sharper, MD 06/21/2024, 6:54 AM PGY-3, [facility]

## 2024-06-21 NOTE — Plan of Care (Signed)
 Called to room by RN for right scrotal pain and swelling.  Patient first noticed the pain yesterday whenever he was being cleaned.  Has never had this before.  The pain is hard to describe but is severe.  Not very sexually active.  Exam demonstrates swollen right testis with firm/indurated mass moving up into the right groin.  Of note, there is mild skin maceration versus mild vesicular lesions proximally in the groin.  The scrotal mass is not able to be transilluminated.  There is pain with light touch and manipulation.  I did not feel any peristalsis.  Differential includes testicular torsion, orchitis, epididymitis, incarcerated inguinal hernia, cellulitis or other skin infection.  Will administer scheduled Tylenol  and Dilaudid  for pain.  Of note, patient has hives/itching with hydrocodone  and oxycodone ; however, it appears he has not had these responses to Dilaudid  in the past.  I have added a dose of IV Benadryl  in case he does have a reaction.  We will also obtain a STAT ultrasound scrotum with Doppler.  Will also obtain urine studies for gonorrhea and chlamydia, this is less likely given his reports of lower sexual activity.

## 2024-06-22 DIAGNOSIS — Z751 Person awaiting admission to adequate facility elsewhere: Secondary | ICD-10-CM

## 2024-06-22 DIAGNOSIS — N189 Chronic kidney disease, unspecified: Secondary | ICD-10-CM | POA: Diagnosis not present

## 2024-06-22 DIAGNOSIS — N451 Epididymitis: Secondary | ICD-10-CM

## 2024-06-22 DIAGNOSIS — D631 Anemia in chronic kidney disease: Secondary | ICD-10-CM | POA: Diagnosis not present

## 2024-06-22 DIAGNOSIS — N453 Epididymo-orchitis: Secondary | ICD-10-CM | POA: Insufficient documentation

## 2024-06-22 LAB — URINALYSIS, W/ REFLEX TO CULTURE (INFECTION SUSPECTED)
Bilirubin Urine: NEGATIVE
Glucose, UA: NEGATIVE mg/dL
Ketones, ur: NEGATIVE mg/dL
Nitrite: NEGATIVE
Protein, ur: NEGATIVE mg/dL
Specific Gravity, Urine: 1.01 (ref 1.005–1.030)
WBC, UA: >50 WBC/hpf (ref 0–5)
pH: 5 (ref 5.0–8.0)

## 2024-06-22 LAB — CBC WITH DIFFERENTIAL/PLATELET
Abs Immature Granulocytes: 0.07 K/uL (ref 0.00–0.07)
Basophils Absolute: 0 K/uL (ref 0.0–0.1)
Basophils Relative: 0 %
Eosinophils Absolute: 0.3 K/uL (ref 0.0–0.5)
Eosinophils Relative: 3 %
HCT: 27.1 % — ABNORMAL LOW (ref 39.0–52.0)
Hemoglobin: 8.5 g/dL — ABNORMAL LOW (ref 13.0–17.0)
Immature Granulocytes: 1 %
Lymphocytes Relative: 14 %
Lymphs Abs: 1.1 K/uL (ref 0.7–4.0)
MCH: 26.2 pg (ref 26.0–34.0)
MCHC: 31.4 g/dL (ref 30.0–36.0)
MCV: 83.4 fL (ref 80.0–100.0)
Monocytes Absolute: 0.6 K/uL (ref 0.1–1.0)
Monocytes Relative: 7 %
Neutro Abs: 5.9 K/uL (ref 1.7–7.7)
Neutrophils Relative %: 75 %
Platelets: 462 K/uL — ABNORMAL HIGH (ref 150–400)
RBC: 3.25 MIL/uL — ABNORMAL LOW (ref 4.22–5.81)
RDW: 16.4 % — ABNORMAL HIGH (ref 11.5–15.5)
WBC: 7.9 K/uL (ref 4.0–10.5)
nRBC: 0 % (ref 0.0–0.2)

## 2024-06-22 LAB — URINE CYTOLOGY ANCILLARY ONLY
Chlamydia: NEGATIVE
Comment: NEGATIVE
Comment: NORMAL
Neisseria Gonorrhea: NEGATIVE

## 2024-06-22 LAB — RENAL FUNCTION PANEL
Albumin: 2.2 g/dL — ABNORMAL LOW (ref 3.5–5.0)
Anion gap: 16 — ABNORMAL HIGH (ref 5–15)
BUN: 34 mg/dL — ABNORMAL HIGH (ref 8–23)
CO2: 18 mmol/L — ABNORMAL LOW (ref 22–32)
Calcium: 9.3 mg/dL (ref 8.9–10.3)
Chloride: 106 mmol/L (ref 98–111)
Creatinine, Ser: 1.83 mg/dL — ABNORMAL HIGH (ref 0.61–1.24)
GFR, Estimated: 41 mL/min — ABNORMAL LOW (ref >60)
Glucose, Bld: 79 mg/dL (ref 70–99)
Phosphorus: 5.1 mg/dL — ABNORMAL HIGH (ref 2.5–4.6)
Potassium: 4.5 mmol/L (ref 3.5–5.1)
Sodium: 140 mmol/L (ref 135–145)

## 2024-06-22 LAB — GLUCOSE, CAPILLARY: Glucose-Capillary: 122 mg/dL — ABNORMAL HIGH (ref 70–99)

## 2024-06-22 MED ORDER — HYDROMORPHONE HCL 2 MG PO TABS
1.0000 mg | ORAL_TABLET | Freq: Four times a day (QID) | ORAL | Status: DC | PRN
  Administered 2024-06-23 – 2024-06-26 (×4): 1 mg via ORAL
  Filled 2024-06-22 (×4): qty 1

## 2024-06-22 NOTE — Assessment & Plan Note (Addendum)
 Noted during his last admission, evaluated by cardiology.  He is not a candidate for transvenous pacing or pacemaker, and cardiology recommended transition to comfort care.  Patient was adamant about being full code during this admission. - Telemetry - CPAP at night, transition to BiPAP if necessary - Ongoing goals of care conversation

## 2024-06-22 NOTE — Discharge Summary (Shared)
 Family Medicine Teaching [facility] Discharge Summary  Patient name: Theodore Arellano Medical record number: [id number] Date of birth: [birth year] Age: 63 y.o. Gender: male Date of Admission: 06/18/2024  Date of Discharge: 06/21/24 Admitting Physician: Gladis Church, DO  Primary Care Provider: [name], MD Consultants: Nephrology, Wound Care  Indication for Hospitalization: Symptomatic anemia  Discharge Diagnoses/Problem List:  Principal Problem for Admission: Symptomatic anemia Other Problems addressed during stay:  Principal Problem:   Symptomatic anemia Active Problems:   History of renal transplant   AKI (acute kidney injury) (HCC)   Intermittent complete heart block (HCC)   Sacral wound   Acute kidney injury (HCC)   Sleep apnea   Chronic indwelling Foley catheter   Chronic ulcer of left heel (HCC)   Kidney transplant recipient   Anemia of renal disease   Orchitis and epididymitis    Brief Hospital Course:  NAHZIR POHLE is a 63 y.o.male with a history of ESRD s/p renal transplant, T2DM, multi-vessel CAD, complete heart block, PAD, HTN, HLD who was admitted to the [facility] Medicine Teaching Service at [facility] for symptomatic anemia after being found to have an Hgb < 7. His hospital course is detailed below:  Symptomatic anemia Patient presented from his nursing facility with symptomatic anemia after being found to have an abnormal Hgb of 6.9, which had down-trended to 6.1 by the time of admission.  Suspected secondary to chronic anemia of chronic renal disease.  He received at total of 2 units of pRBCs.  With appropriate response in hemoglobin.  Nephrology was consulted for below kidney injury and additionally started ESA per the patient.  Hemoglobin at time of discharge was 8.0.  Acute kidney injury On admission Cr was elevated to 3.1. Baseline creatinine appears about 1.8. Patient reporting poor oral intake and with history of failure to thrive. Patient is  s/p renal transplant in 2011, currently on immunosuppressant therapy. Nephrology was consulted, they recommended continuing immunosuppressive therapy and supportive oral rehydration.  Creatinine steadily downtrended throughout admission with these measures, at discharge creatinine was 2.0.  Sacral wound ulcer Patient presented with chronic stage IV pressure injury ulcer. Wound care was consulted for management.  Other chronic conditions were medically managed with home medications and formulary alternatives as necessary (BPH, constipation, hypertension, gout)  PCP Follow-up Recommendations: Likely would benefit with outpatient urology for BPH. Discharged on Lasix  40 mg Monday Wednesday Friday Hypertension-losartan  held secondary to AKI indefinitely per nephrology, blood pressure controlled at discharge only on amlodipine  Repeat CBC 1 week Ensure follow-up with nephrology A fib - Unclear if taking Xarelto  at SNF, per recent cardiology office note, decision made to not anticoagulate. Xarelto  held at discharge in the setting of anemia.    Results/Tests Pending at Time of Discharge:  Unresulted Labs (From admission, onward)     Start     Ordered   06/21/24 1828  Urinalysis, w/ Reflex to Culture (Infection Suspected) -Urine, Clean Catch  (Urine Labs)  Once,   R       Question:  Specimen Source  Answer:  Urine, Clean Catch   06/21/24 1827             Disposition: SNF  Discharge Condition: stable  Discharge Exam:  Vitals:   06/22/24 0506 06/22/24 0927  BP: (!) 157/80 (!) 143/78  Pulse: 62 72  Resp: 19 19  Temp: 97.8 F (36.6 C) 98.4 F (36.9 C)  SpO2: 100% 100%   Physical Exam: General: NAD, nontoxic appearing Cardiovascular: regular  rate and rhythm Respiratory: normal work of breathing. Lungs clear to auscultation bilaterally GU: scrotum nontender. Minimal amount of swelling to the R testicle. Firm to touch compared to L testicle.  Extremities: moving extremities normally  compared to baseline. No edema.   Significant Procedures: None  Significant Labs and Imaging:  Recent Labs  Lab 06/21/24 0359 06/22/24 0416  WBC 8.7 7.9  HGB 8.0* 8.5*  HCT 25.2* 27.1*  PLT 417* 462*   Recent Labs  Lab 06/21/24 0359 06/22/24 0416  NA 138 140  K 4.7 4.5  CL 106 106  CO2 20* 18*  GLUCOSE 88 79  BUN 34* 34*  CREATININE 2.05* 1.83*  CALCIUM  9.2 9.3  PHOS 4.6 5.1*  ALBUMIN  2.2* 2.2*    US  Renal Transplant w/ Doppler 1. Stable left lower quadrant transplant kidney, with stable pelvicaliectasis. 2. Normal Doppler evaluation.   Discharge Medications:  Allergies as of 06/22/2024       Reactions   Percocet [oxycodone -acetaminophen ] Hives, Itching   Vicodin [hydrocodone -acetaminophen ] Hives, Itching   Shellfish Allergy Hives   Chocolate Flavoring Agent (non-screening) Hives   Fish Allergy    Chocolate Hives   Tomato Hives     Med Rec must be completed prior to using this [facility]***      Discharge Instructions: Please refer to Patient Instructions section of EMR for full details.  Patient was counseled important signs and symptoms that should prompt return to medical care, changes in medications, dietary instructions, activity restrictions, and follow up appointments.   Follow-Up Appointments:  Contact information for follow-up providers     [name], MD. Schedule an appointment as soon as possible for a visit in 1 week(s).   Specialty: Family Medicine Contact information: [address] [place] [address] [phone]              Contact information for after-discharge care     Destination     [facility] .   Service: Skilled Nursing Contact information: [address] [place]  [address] [phone]                       Marsa ONEIDA Kenning MPH, MSc 06/22/2024, 1:25 PM MS4 AI, [facility]

## 2024-06-22 NOTE — Progress Notes (Signed)
 Mobility Specialist Progress Note:    06/22/24 1153  Mobility  Activity Turned to right side;Turned to left side (and bed level exercises)  Level of Assistance Minimal assist, patient does 75% or more  Activity Response Tolerated well  Mobility Referral Yes  Mobility visit 1 Mobility  Mobility Specialist Start Time (ACUTE ONLY) 0935  Mobility Specialist Stop Time (ACUTE ONLY) 0945  Mobility Specialist Time Calculation (min) (ACUTE ONLY) 10 min   Pt received in bed agreeable to mobility. Politely declined OOB mobility d/t just getting comfortable but agreeable to some bed mobility and bed level exercises. Was able to perform exercises w/ supervision and MinA w/ bed mobility. No c/o throughout. Left comfortable in bed w/ call bell and personal belongings in reach. All needs met.   Thersia Minder Mobility Specialist  Please contact vis Secure Chat or  Rehab Office [phone]

## 2024-06-22 NOTE — Assessment & Plan Note (Addendum)
 Does have BPH. Pt has been voiding well without indwelling catheter since admission. We do not feel the need to continue indwelling foley catheter at this time.  - Flomax  0.8 mg daily - Monitor for signs of retention

## 2024-06-22 NOTE — Assessment & Plan Note (Addendum)
 Pt with severe R scrotal pain and swelling. Repeat exam shows decreased swelling and no tenderness.  - 10 day course of Bactrim  800-160 mg BID (9/21-9/30) - Dilaudid  1 mg q6h prn for pain

## 2024-06-22 NOTE — Assessment & Plan Note (Addendum)
 History of stage IV pressure wound ulcer.  Previously treated with antibiotics and wound care at [facility].  Wound looks improved from previous pictures, will continue wound care as instructed previously. - WOC consulted

## 2024-06-22 NOTE — Plan of Care (Signed)
 [facility] [phone]) regarding clarification on pt's usage of Xarelto  and indwelling foley catheter. The nurse stated that the patient was on 2.5 mg of Xarelto  daily while he was at their facility. She stated that she did not remember him having an indwelling foley catheter during his stay, and that there were no notes suggesting he was on an indwelling foley catheter for the time that he was at their facility.   Given the Cardiology note from 05/18/2024 that denied the necessity for anticoagulation, the fact that he was not anticoagulated here, and the fact that he has not been in A fib throughout this admission, we have chosen to discontinue the prescription for the future. PCP can reassess the need for this medication at a later date.   Marsa ONEIDA Kenning MS4 AI, MPH, MSc

## 2024-06-22 NOTE — Progress Notes (Addendum)
 Daily Progress Note Intern Pager: [phone]  Patient name: Theodore Arellano Medical record number: [id number] Date of birth: September 09, 1961 Age: 63 y.o. Gender: male  Primary Care Provider: [name], MD Consultants: Wound Care Code Status: Full code  Pt Overview and Major Events to Date:  9/18: Admitted; 2u PRBC   Medical Decision Making: Theodore Arellano is a 63 yo male with past medical history including ESRD s/p kidney transplant on immunosuppressives, unstable angina s/p CABG, complete heart block, paroxysmal A-fib, HTN, severe CAD, and T2DM who was admitted for symptomatic anemia and acute on chronic kidney injury.  Hemoglobin has improved s/p transfusion and initiating ESA and improvement in AKI.  Now medically stable for discharge, waiting SNF authorization. Assessment & Plan Symptomatic anemia Nephrology initiated ESA on 9/20.  Hemoglobin up-trending 8.0 > 8.5 this morning. -  Monitor for signs of bleeding -  Transfuse if hemoglobin < 8 (hx of CAD) - Aranesp  40 mcg started on 9/20, and will recur weekly AKI (acute kidney injury) (HCC) History of renal transplant Creatinine this a.m. continues to improve to 1.83.  -  Avoid nephrotoxic agents - Per nephro, held Losartan  and Lasix  MWF - Strict I/Os and daily weights Chronic indwelling Foley catheter Does have BPH. Pt has been voiding well without indwelling catheter since admission. We do not feel the need to continue indwelling foley catheter at this time.  - Flomax  0.8 mg daily - Monitor for signs of retention Sacral wound Chronic ulcer of left heel (HCC) History of stage IV pressure wound ulcer.  Previously treated with antibiotics and wound care at [facility].  Wound looks improved from previous pictures, will continue wound care as instructed previously. - WOC consulted Orchitis and epididymitis Pt with severe R scrotal pain and swelling. Repeat exam shows decreased swelling and no tenderness.  - 10 day course of Bactrim   800-160 mg BID (9/21-9/30) - Dilaudid  1 mg q6h prn for pain Intermittent complete heart block (HCC) Sleep apnea Noted during his last admission, evaluated by cardiology.  He is not a candidate for transvenous pacing or pacemaker, and cardiology recommended transition to comfort care.  Patient was adamant about being full code during this admission. 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 Held other meds 2/2 AKI. BP bumped this morning.  PAD: ASA currently holding in the setting of symptomatic anemia HLD: Rosuvastatin  20 mg daily T2DM: last A1c 6.4, not on medications.  Monitor CBGs, appear stable while on prednisone .  FEN/GI: Heart healthy diet PPx: SCDs Dispo:SNF today. Barriers to dispo are authorization for SNF.  Subjective:  Pt states he is feeling well. Reports mild pain to his sacral and L heel from chronic ulcerations, but no pain anywhere else. Denies pain to the scrotum. Denies SOB, fatigue. No other medical concerns at this time.   Objective: Temp:  [97.8 F (36.6 C)-98.1 F (36.7 C)] 97.8 F (36.6 C) (09/22 0506) Pulse Rate:  [62-74] 62 (09/22 0506) Resp:  [18-19] 19 (09/22 0506) BP: (140-157)/(68-80) 157/80 (09/22 0506) SpO2:  [100 %] 100 % (09/22 0506) Weight:  [77 kg] 77 kg (09/22 0540) Physical Exam:  General: NAD, nontoxic appearing Cardiovascular: regular rate and rhythm Respiratory: normal work of breathing. Lungs clear to auscultation bilaterally GU: scrotum nontender. Minimal amount of swelling to the R testicle. Firm to touch compared to L testicle.  Extremities:  moving extremities normally compared to baseline. No edema.   Laboratory: Most recent CBC Lab Results  Component Value Date   WBC 7.9 06/22/2024   HGB 8.5 (L) 06/22/2024   HCT 27.1 (L) 06/22/2024   MCV 83.4 06/22/2024   PLT 462 (H) 06/22/2024   Most recent BMP    Latest Ref Rng & Units 06/22/2024    4:16 AM  BMP  Glucose 70 - 99 mg/dL 79   BUN 8 - 23 mg/dL 34   Creatinine 9.38 - 1.24 mg/dL 8.16   Sodium 864 - 854 mmol/L 140   Potassium 3.5 - 5.1 mmol/L 4.5   Chloride 98 - 111 mmol/L 106   CO2 22 - 32 mmol/L 18   Calcium  8.9 - 10.3 mg/dL 9.3     Imaging/Diagnostic Tests: Radiologist Impression:  DOPPLER ULTRASOUND OF THE TESTICLES  IMPRESSION: 1. No evidence of testicular torsion. 2. Findings compatible with orchitis and epididymitis on the right with moderate complex hydrocele on the right. 3. Bilateral epididymal head cysts.  [name], Medical Student 06/22/2024, 7:57 AM  MS4 AI, [place] [facility] FPTS Intern pager: [phone], text pages welcome Secure chat group [facility] Teaching Service  I have discussed the above with Student Doctor Theodore Arellano and agree with the documented plan. My edits for correction/addition/clarification are included above. Please see any attending notes.   Kathrine Melena, DO PGY-2, [facility] 06/22/2024 2:48 PM

## 2024-06-22 NOTE — Assessment & Plan Note (Addendum)
 Creatinine this a.m. continues to improve to 1.83.  -  Avoid nephrotoxic agents - Per nephro, held Losartan  and Lasix  MWF - Strict I/Os and daily weights

## 2024-06-22 NOTE — Care Management Important Message (Signed)
 Important Message  Patient Details  Name: Theodore Arellano MRN: [id number] Date of Birth: 12-08-1960   Important Message Given:  Yes - Medicare IM     Claretta Deed 06/22/2024, 3:56 PM

## 2024-06-22 NOTE — Assessment & Plan Note (Addendum)
 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 Held other meds 2/2 AKI. BP bumped this morning.  PAD: ASA currently holding in the setting of symptomatic anemia HLD: Rosuvastatin  20 mg daily T2DM: last A1c 6.4, not on medications.  Monitor CBGs, appear stable while on prednisone .

## 2024-06-22 NOTE — Assessment & Plan Note (Addendum)
 Nephrology initiated ESA on 9/20.  Hemoglobin up-trending 8.0 > 8.5 this morning. -  Monitor for signs of bleeding -  Transfuse if hemoglobin < 8 (hx of CAD) - Aranesp  40 mcg started on 9/20, and will recur weekly

## 2024-06-23 DIAGNOSIS — N453 Epididymo-orchitis: Secondary | ICD-10-CM | POA: Diagnosis not present

## 2024-06-23 DIAGNOSIS — D649 Anemia, unspecified: Secondary | ICD-10-CM | POA: Diagnosis not present

## 2024-06-23 DIAGNOSIS — N451 Epididymitis: Secondary | ICD-10-CM | POA: Diagnosis not present

## 2024-06-23 DIAGNOSIS — K5900 Constipation, unspecified: Secondary | ICD-10-CM | POA: Insufficient documentation

## 2024-06-23 LAB — GLUCOSE, CAPILLARY
Glucose-Capillary: 75 mg/dL (ref 70–99)
Glucose-Capillary: 100 mg/dL — ABNORMAL HIGH (ref 70–99)
Glucose-Capillary: 109 mg/dL — ABNORMAL HIGH (ref 70–99)

## 2024-06-23 MED ORDER — ACETAMINOPHEN 500 MG PO TABS: 1000.0000 mg | ORAL_TABLET | Freq: Four times a day (QID) | ORAL | Status: DC | PRN

## 2024-06-23 MED ORDER — SULFAMETHOXAZOLE-TRIMETHOPRIM 800-160 MG PO TABS: 1.0000 | ORAL_TABLET | Freq: Two times a day (BID) | ORAL | Status: DC

## 2024-06-23 MED ORDER — LIDOCAINE 5 % EX PTCH
1.0000 | MEDICATED_PATCH | CUTANEOUS | Status: AC
  Administered 2024-06-23: 1 via TRANSDERMAL
  Filled 2024-06-23: qty 1

## 2024-06-23 NOTE — Assessment & Plan Note (Signed)
 Creatinine yesterday a.m. continued to improve to 1.83.  -  Avoid nephrotoxic agents - Per nephro, held Losartan  and Lasix  MWF - Strict I/Os and daily weights

## 2024-06-23 NOTE — Assessment & Plan Note (Signed)
 Noted during his last admission, evaluated by cardiology.  He is not a candidate for transvenous pacing or pacemaker, and cardiology recommended transition to comfort care.  Patient was adamant about being full code during this admission. - Telemetry - CPAP at night, transition to BiPAP if necessary - Ongoing goals of care conversation

## 2024-06-23 NOTE — Assessment & Plan Note (Addendum)
 Nephrology initiated ESA on 9/20.  Asymptomatic this morning. Stable. - Monitor for signs of bleeding - Transfuse if hemoglobin < 8 (hx of CAD) - Aranesp  40 mcg started on 9/20 and will recur weekly

## 2024-06-23 NOTE — Assessment & Plan Note (Addendum)
 Pt now with improved R scrotal pain and swelling. UA shows large leukocytes and many bacteria. Urine chlamydia/gonorrhea was negative. Covering for E. Coli as cause likely chronic indwelling catheter which has been discontinued. - 10 day course of Bactrim  800-160 mg BID (9/21-9/30)

## 2024-06-23 NOTE — TOC Progression Note (Addendum)
 Transition of Care [facility]) - Progression Note    Patient Details  Name: Theodore Arellano MRN: [id number] Date of Birth: March 23, 1961  Transition of Care [facility]) CM/SW Contact  Lendia Dais, [place] Phone Number: 06/23/2024, 11:56 AM  Clinical Narrative:   Pt is from [facility].  Patient's insurance is requesting a peer to peer before making a final decision. Deadline is before noon, number to call is [phone] opt. 5.  08:51 - MD stated that insurance requested for updated PT/OT notes. CSW faxed PT/OT notes from today to [phone]. Pending decision.  1422 - Auth was denied pt has the option/right to appeal.     Expected Discharge Plan: Skilled Nursing Facility Barriers to Discharge: Insurance Authorization               Expected Discharge Plan and Services       Living arrangements for the past 2 months: Skilled Nursing Facility                                       Social Drivers of Health (SDOH) Interventions SDOH Screenings   Food Insecurity: No Food Insecurity (06/19/2024)  Housing: Low Risk  (06/19/2024)  Transportation Needs: No Transportation Needs (06/19/2024)  Recent Concern: Transportation Needs - Unmet Transportation Needs (05/02/2024)  Utilities: Not At Risk (06/19/2024)  Tobacco Use: High Risk (06/19/2024)    Readmission Risk Interventions     No data to display

## 2024-06-23 NOTE — TOC Progression Note (Signed)
 Transition of Care [facility]) - Progression Note    Patient Details  Name: Theodore Arellano MRN: [id number] Date of Birth: [birth year]  Transition of Care [facility]) CM/SW Contact  Lendia Dais, [place] Phone Number: 06/23/2024, 4:09 PM  Clinical Narrative:  CSW spoke to pt at bedside.  CSW informed the pt that the insurance denied auth to SNF. CSW stated that there is an option to appeal that decision and it can take up to 72 hours to here back. Pt was agreeable to appealing their decision.    CSW fax over progress note, H&P, and PT/OT notes to [facility] Medicare fast appeals at [phone].  Pending a decision on appeal.  CSW will continue to follow.    Expected Discharge Plan: Skilled Nursing Facility Barriers to Discharge: Insurance Authorization               Expected Discharge Plan and Services       Living arrangements for the past 2 months: Skilled Nursing Facility                                       Social Drivers of Health (SDOH) Interventions SDOH Screenings   Food Insecurity: No Food Insecurity (06/19/2024)  Housing: Low Risk  (06/19/2024)  Transportation Needs: No Transportation Needs (06/19/2024)  Recent Concern: Transportation Needs - Unmet Transportation Needs (05/02/2024)  Utilities: Not At Risk (06/19/2024)  Tobacco Use: High Risk (06/19/2024)    Readmission Risk Interventions     No data to display

## 2024-06-23 NOTE — Assessment & Plan Note (Signed)
 Pt has not had a bowel movement for 4-5 days. On chart review, pt has not gotten his PRN Miralax . No abdominal TTP.  - PRN miralax  given.

## 2024-06-23 NOTE — Progress Notes (Signed)
 Patient had 2nd HB SB HR 33 the cardiac monitoring had expired 11 hrs ago MD Dr. Coralee notified cardiac monitoring reordered patient was asymptomatic

## 2024-06-23 NOTE — Assessment & Plan Note (Deleted)
 Does have BPH. Pt has been voiding well without indwelling catheter since admission. We do not feel the need to continue indwelling foley catheter at this time.  - Flomax  0.8 mg daily - Monitor for signs of retention

## 2024-06-23 NOTE — Plan of Care (Signed)

## 2024-06-23 NOTE — Assessment & Plan Note (Signed)
 History of stage IV pressure wound ulcer.  Previously treated with antibiotics and wound care at [facility].  Wound looks improved from previous pictures, will continue wound care as instructed previously. - WOC consulted

## 2024-06-23 NOTE — Progress Notes (Signed)
 Occupational Therapy Treatment Patient Details Name: Theodore Arellano MRN: [id number] DOB: [birth year] Today's Date: 06/23/2024   History of present illness Pt is a 63 y.o. M who presents from SNF with abnormal labs. Hemoglobin here was 6.1 and also found to have AKI with creatinine of 6.9. PMH includes HLD, HTN, CKD III, PAD, systolic HF, blindness in R eye, CABG, gout, DVT, cocaine  abuse, critical lower limb ischemia.   OT comments  Pt with slow, steady progression toward established OT goals. Challenging activity tolerance, cognition, and return to BADL. Pt needing up to mod A for LB ADL this session and for SPT simulated in session to [facility]. Pt with decreasd activity tolerance and poor tolerance of prolonged weightbearing on L knee. Will continue to follow acutely. Patient will benefit from continued inpatient follow up therapy, <3 hours/day       If plan is discharge home, recommend the following:  A little help with walking and/or transfers;Assistance with feeding;Assistance with cooking/housework;Assist for transportation;A little help with bathing/dressing/bathroom   Equipment Recommendations  Other (comment) (defer)    Recommendations for Other Services      Precautions / Restrictions Precautions Precautions: Fall;Other (comment) Recall of Precautions/Restrictions: Intact Precaution/Restrictions Comments: Sacral and L heel unstageable wounds Restrictions Weight Bearing Restrictions Per Provider Order: No       Mobility Bed Mobility               General bed mobility comments: OOB in recliner    Transfers Overall transfer level: Needs assistance Equipment used: None Transfers: Bed to chair/wheelchair/BSC, Sit to/from Stand Sit to Stand: Min assist     Step pivot transfers: Mod assist           Balance Overall balance assessment: Needs assistance Sitting-balance support: Feet supported Sitting balance-Leahy Scale: Good     Standing balance support:  Bilateral upper extremity supported, During functional activity Standing balance-Leahy Scale: Poor Standing balance comment: reliant on external support                           ADL either performed or assessed with clinical judgement   ADL Overall ADL's : Needs assistance/impaired     Grooming: Set up;Supervision/safety;Sitting Grooming Details (indicate cue type and reason): unable to tolerate challenge without CGA             Lower Body Dressing: Moderate assistance;Sitting/lateral leans Lower Body Dressing Details (indicate cue type and reason): for socks Toilet Transfer: Minimal assistance;Stand-pivot;Rolling walker (2 wheels)           Functional mobility during ADLs: Moderate assistance      Extremity/Trunk Assessment Upper Extremity Assessment Upper Extremity Assessment: Generalized weakness   Lower Extremity Assessment Lower Extremity Assessment: Defer to PT evaluation        Vision       Perception     Praxis     Communication Communication Communication: No apparent difficulties   Cognition Arousal: Alert Behavior During Therapy: WFL for tasks assessed/performed Cognition: No family/caregiver present to determine baseline, Cognition impaired   Orientation impairments: Time, Situation (pt reports he does not remember the last time he was home and often does not know where he is at) Awareness: Intellectual awareness intact, Online awareness impaired Memory impairment (select all impairments): Short-term memory, Declarative long-term memory Attention impairment (select first level of impairment): Sustained attention, Selective attention Executive functioning impairment (select all impairments): Problem solving  Following commands: Intact        Cueing   Cueing Techniques: Verbal cues  Exercises      Shoulder Instructions       General Comments Pt prefers to position onto L hip in recliner due to  sacral wound.    Pertinent Vitals/ Pain       Pain Assessment Pain Assessment: Faces Faces Pain Scale: Hurts little more Pain Location: L knee Pain Descriptors / Indicators: Grimacing, Sore, Tender Pain Intervention(s): Limited activity within patient's tolerance, Monitored during session  Home Living                                          Prior Functioning/Environment              Frequency  Min 2X/week        Progress Toward Goals  OT Goals(current goals can now be found in the care plan section)  Progress towards OT goals: Progressing toward goals  Acute Rehab OT Goals OT Goal Formulation: With patient Time For Goal Achievement: 07/04/24 Potential to Achieve Goals: Good ADL Goals Pt Will Perform Lower Body Bathing: with min assist Pt Will Perform Lower Body Dressing: with min assist;with adaptive equipment Pt Will Transfer to Toilet: with modified independence Pt Will Perform Toileting - Clothing Manipulation and hygiene: with min assist  Plan      Co-evaluation                 AM-PAC OT 6 Clicks Daily Activity     Outcome Measure   Help from another person eating meals?: A Little Help from another person taking care of personal grooming?: A Little Help from another person toileting, which includes using toliet, bedpan, or urinal?: A Lot Help from another person bathing (including washing, rinsing, drying)?: A Lot Help from another person to put on and taking off regular upper body clothing?: Total Help from another person to put on and taking off regular lower body clothing?: A Lot 6 Click Score: 13    End of Session Equipment Utilized During Treatment: Gait belt  OT Visit Diagnosis: Unsteadiness on feet (R26.81);Muscle weakness (generalized) (M62.81)   Activity Tolerance Patient tolerated treatment well   Patient Left in chair;with chair alarm set;with call bell/phone within reach   Nurse Communication Mobility  status        Time: 8983-8967 OT Time Calculation (min): 16 min  Charges: OT General Charges $OT Visit: 1 Visit OT Treatments $Self Care/Home Management : 8-22 mins  Theodore Arellano, Theodore Arellano [facility] Acute [facility] Office: [phone]   Theodore JONETTA Lebron 06/23/2024, 10:48 AM

## 2024-06-23 NOTE — Progress Notes (Addendum)
 Daily Progress Note Intern Pager: [phone]  Patient name: Theodore Arellano Medical record number: [id number] Date of birth: 1961/01/25 Age: 63 y.o. Gender: male  Primary Care Provider: [name], MD Consultants: Wound Care Code Status: Full code  Pt Overview and Major Events to Date:  9/18: Admitted; 2u PRBC  9/20: Started on ESA (Aranesp  )  Medical Decision Making:  Theodore Arellano is a 63 yo male with past medical history including ESRD s/p kidney transplant on immunosuppressives, unstable angina s/p CABG, complete heart block, paroxysmal A-fib, HTN, severe CAD, and T2DM who was admitted for symptomatic anemia and acute on chronic kidney injury.  Hemoglobin has improved s/p transfusion and initiating ESA and improvement in AKI.  Now medically stable for discharge, waiting SNF authorization.  Assessment & Plan Symptomatic anemia Nephrology initiated ESA on 9/20.  Asymptomatic this morning. Stable. -  Monitor for signs of bleeding -  Transfuse if hemoglobin < 8 (hx of CAD) - Aranesp  40 mcg started on 9/20 and will recur weekly AKI (acute kidney injury) History of renal transplant Creatinine yesterday a.m. continued to improve to 1.83.  -  Avoid nephrotoxic agents - Per nephro, held Losartan  and Lasix  MWF - Strict I/Os and daily weights Sacral wound Chronic ulcer of left heel (HCC) History of stage IV pressure wound ulcer.  Previously treated with antibiotics and wound care at [facility].  Wound looks improved from previous pictures, will continue wound care as instructed previously. - WOC consulted Orchitis and epididymitis Pt now with improved R scrotal pain and swelling. UA shows large leukocytes and many bacteria. Urine chlamydia/gonorrhea was negative. Covering for E. Coli as cause likely chronic indwelling catheter which has been discontinued. - 10 day course of Bactrim  800-160 mg BID (9/21-9/30) Intermittent complete heart block (HCC) Sleep apnea Noted during his  last admission, evaluated by cardiology.  He is not a candidate for transvenous pacing or pacemaker, and cardiology recommended transition to comfort care.  Patient was adamant about being full code during this admission. - Telemetry - CPAP at night, transition to BiPAP if necessary - Ongoing goals of care conversation Constipation Pt has not had a bowel movement for 4-5 days. On chart review, pt has not gotten his PRN Miralax . No abdominal TTP.  - PRN miralax  given.  Person awaiting admission to adequate facility elsewhere Pt awaiting authorization to return to [facility]. PT reevaluation patient to determine whether pt needs intermittent vs full-spectrum services. 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 Held other meds 2/2 AKI. PAD: ASA currently holding in the setting of symptomatic anemia HLD: Rosuvastatin  20 mg daily T2DM: last A1c 6.4, not on medications.  Monitor CBGs, appear stable while on prednisone .  FEN/GI: Heart Healthy diet PPx: SCDs Dispo:SNF today. Barriers include authorization.   Subjective:  Pt states he is feeling well overall. States that he has not had a bowel movement in 4 or 5 days. He was concerned that he would hurt himself if he had to move to the bathroom, and was concerned about making a mess trying to pass a bowel movement in the bed. Denies pain with urination. Denies abdominal pain. Denies any other medical concerns a this time.   Objective: Temp:  [98.1  F (36.7 C)-98.7 F (37.1 C)] 98.7 F (37.1 C) (09/23 0528) Pulse Rate:  [66-72] 66 (09/22 2041) Resp:  [19-20] 20 (09/22 1710) BP: (139-159)/(78-101) 158/80 (09/23 0528) SpO2:  [100 %] 100 % (09/23  0528) Physical Exam: General: Nontoxic appearing. NAD Cardiovascular: rate and rhythm regular Respiratory: lungs clear to auscultation bilaterally without wheezes or crackles GU: sizable swelling to the R testicle with firmness. No tenderness  Extremities: moving all extremities normally according to baseline. L knee with arthritic pain and limited ROM 2/2 pain. No edema  Laboratory: Most recent CBC Lab Results  Component Value Date   WBC 7.9 06/22/2024   HGB 8.5 (L) 06/22/2024   HCT 27.1 (L) 06/22/2024   MCV 83.4 06/22/2024   PLT 462 (H) 06/22/2024   Most recent BMP    Latest Ref Rng & Units 06/22/2024    4:16 AM  BMP  Glucose 70 - 99 mg/dL 79   BUN 8 - 23 mg/dL 34   Creatinine 9.38 - 1.24 mg/dL 8.16   Sodium 864 - 854 mmol/L 140   Potassium 3.5 - 5.1 mmol/L 4.5   Chloride 98 - 111 mmol/L 106   CO2 22 - 32 mmol/L 18   Calcium  8.9 - 10.3 mg/dL 9.3     Urine dipstick shows positive for leukocytes.  Micro exam: >50 WBC's per HPF and Many + bacteria.   Imaging/Diagnostic Tests: No new imaging received.    Benjamine Arellano DASEN, Medical Student 06/23/2024, 7:13 AM  MS4 AI, [place] [facility] FPTS Intern pager: [phone], text pages welcome Secure chat group [facility] Teaching Service    I agree with the assessment and plan as documented above.  Stuart Redo, MD PGY-3, [facility]

## 2024-06-23 NOTE — Progress Notes (Signed)
 Physical Therapy Treatment Patient Details Name: Theodore Arellano MRN: [id number] DOB: 09-11-1961 Today's Date: 06/23/2024   History of Present Illness Pt is a 63 y.o. M who presents from SNF with abnormal labs. Hemoglobin here was 6.1 and also found to have AKI with creatinine of 6.9. PMH includes HLD, HTN, CKD III, PAD, systolic HF, blindness in R eye, CABG, gout, DVT, cocaine  abuse, critical lower limb ischemia.    PT Comments  Pt in bed on arrival. C/o L knee pain but agreeable to participation in PT for OOB transfer. Pt required CGA bed mobility, min assist sit to stand, and min assist SPT. Pt in recliner at end of session with feet elevated. Current POC remains appropriate.     If plan is discharge home, recommend the following: A little help with walking and/or transfers;A little help with bathing/dressing/bathroom;Assistance with cooking/housework;Assist for transportation;Help with stairs or ramp for entrance;Supervision due to cognitive status   Can travel by private vehicle     Yes  Equipment Recommendations  Other (comment) (defer to SNF)    Recommendations for Other Services       Precautions / Restrictions Precautions Precautions: Fall;Other (comment) Recall of Precautions/Restrictions: Intact Precaution/Restrictions Comments: Sacral and L heel unstageable wounds     Mobility  Bed Mobility Overal bed mobility: Needs Assistance Bed Mobility: Supine to Sit     Supine to sit: Contact guard, Used rails     General bed mobility comments: Increased time, CGA due to safety with air mattress    Transfers Overall transfer level: Needs assistance Equipment used: None Transfers: Bed to chair/wheelchair/BSC, Sit to/from Stand Sit to Stand: Min assist           General transfer comment: SPT with face-to-face assist, limited stance time due to L knee pain    Ambulation/Gait               General Gait Details: deferred due to L knee pain   Stairs              Wheelchair Mobility     Tilt Bed    Modified Rankin (Stroke Patients Only)       Balance Overall balance assessment: Needs assistance Sitting-balance support: Feet supported Sitting balance-Leahy Scale: Good     Standing balance support: Bilateral upper extremity supported, During functional activity Standing balance-Leahy Scale: Poor Standing balance comment: reliant on external support                            Communication Communication Communication: No apparent difficulties  Cognition Arousal: Alert Behavior During Therapy: WFL for tasks assessed/performed   PT - Cognitive impairments: No apparent impairments                         Following commands: Intact      Cueing Cueing Techniques: Verbal cues  Exercises      General Comments General comments (skin integrity, edema, etc.): Pt prefers to position onto L hip in recliner due to sacral wound.      Pertinent Vitals/Pain Pain Assessment Pain Assessment: Faces Faces Pain Scale: Hurts little more Pain Location: L knee Pain Descriptors / Indicators: Grimacing, Sore, Tender Pain Intervention(s): Limited activity within patient's tolerance, Monitored during session, Repositioned    Home Living  Prior Function            PT Goals (current goals can now be found in the care plan section) Acute Rehab PT Goals Patient Stated Goal: decrease pain Progress towards PT goals: Progressing toward goals    Frequency    Min 1X/week      PT Plan      Co-evaluation              AM-PAC PT 6 Clicks Mobility   Outcome Measure  Help needed turning from your back to your side while in a flat bed without using bedrails?: A Little Help needed moving from lying on your back to sitting on the side of a flat bed without using bedrails?: A Little Help needed moving to and from a bed to a chair (including a wheelchair)?: A Little Help  needed standing up from a chair using your arms (e.g., wheelchair or bedside chair)?: A Little Help needed to walk in hospital room?: Total Help needed climbing 3-5 steps with a railing? : Total 6 Click Score: 14    End of Session Equipment Utilized During Treatment: Gait belt Activity Tolerance: Patient limited by pain Patient left: in chair;with call bell/phone within reach;with chair alarm set Nurse Communication: Mobility status PT Visit Diagnosis: Muscle weakness (generalized) (M62.81);Difficulty in walking, not elsewhere classified (R26.2);Pain Pain - Right/Left: Left Pain - part of body: Knee     Time: 9095-9081 PT Time Calculation (min) (ACUTE ONLY): 14 min  Charges:    $Therapeutic Activity: 8-22 mins PT General Charges $$ ACUTE PT VISIT: 1 Visit                     Sari MATSU., PT  Office # [phone]    Erven Sari Shaker 06/23/2024, 9:57 AM

## 2024-06-23 NOTE — Assessment & Plan Note (Addendum)
 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 Held other meds 2/2 AKI. PAD: ASA currently holding in the setting of symptomatic anemia HLD: Rosuvastatin  20 mg daily T2DM: last A1c 6.4, not on medications.  Monitor CBGs, appear stable while on prednisone .

## 2024-06-23 NOTE — Assessment & Plan Note (Signed)
 Pt awaiting authorization to return to [facility]. PT reevaluation patient to determine whether pt needs intermittent vs full-spectrum services.

## 2024-06-23 NOTE — Plan of Care (Signed)
 Spoke with insurance representative concerning nursing facility authorization.  States patient does not meet criteria for full spectrum skilled nursing but could meet criteria for intermittent services at the nursing home.  I will reach out to care manager, social worker, PT, and OT to see if we can have updated records of patient's functional status by 2 PM today.  SW/CM can let insurance know if no does not end by 2 PM to allow for a verbal relay of patient's functional status.

## 2024-06-24 DIAGNOSIS — M1120 Other chondrocalcinosis, unspecified site: Secondary | ICD-10-CM

## 2024-06-24 DIAGNOSIS — Z951 Presence of aortocoronary bypass graft: Secondary | ICD-10-CM

## 2024-06-24 DIAGNOSIS — N453 Epididymo-orchitis: Secondary | ICD-10-CM | POA: Diagnosis not present

## 2024-06-24 DIAGNOSIS — N451 Epididymitis: Secondary | ICD-10-CM | POA: Diagnosis not present

## 2024-06-24 DIAGNOSIS — N179 Acute kidney failure, unspecified: Secondary | ICD-10-CM | POA: Diagnosis not present

## 2024-06-24 DIAGNOSIS — N452 Orchitis: Secondary | ICD-10-CM | POA: Diagnosis not present

## 2024-06-24 DIAGNOSIS — E119 Type 2 diabetes mellitus without complications: Secondary | ICD-10-CM

## 2024-06-24 DIAGNOSIS — N183 Chronic kidney disease, stage 3 unspecified: Secondary | ICD-10-CM

## 2024-06-24 DIAGNOSIS — D75839 Thrombocytosis, unspecified: Secondary | ICD-10-CM

## 2024-06-24 DIAGNOSIS — Z940 Kidney transplant status: Secondary | ICD-10-CM | POA: Diagnosis not present

## 2024-06-24 DIAGNOSIS — I70212 Atherosclerosis of native arteries of extremities with intermittent claudication, left leg: Secondary | ICD-10-CM

## 2024-06-24 DIAGNOSIS — I2510 Atherosclerotic heart disease of native coronary artery without angina pectoris: Secondary | ICD-10-CM

## 2024-06-24 LAB — RENAL FUNCTION PANEL
Albumin: 2.4 g/dL — ABNORMAL LOW (ref 3.5–5.0)
Anion gap: 10 (ref 5–15)
BUN: 33 mg/dL — ABNORMAL HIGH (ref 8–23)
CO2: 20 mmol/L — ABNORMAL LOW (ref 22–32)
Calcium: 9.2 mg/dL (ref 8.9–10.3)
Chloride: 106 mmol/L (ref 98–111)
Creatinine, Ser: 2.26 mg/dL — ABNORMAL HIGH (ref 0.61–1.24)
GFR, Estimated: 32 mL/min — ABNORMAL LOW (ref >60)
Glucose, Bld: 82 mg/dL (ref 70–99)
Phosphorus: 4.6 mg/dL (ref 2.5–4.6)
Potassium: 4.7 mmol/L (ref 3.5–5.1)
Sodium: 136 mmol/L (ref 135–145)

## 2024-06-24 LAB — CBC
HCT: 28.1 % — ABNORMAL LOW (ref 39.0–52.0)
Hemoglobin: 8.9 g/dL — ABNORMAL LOW (ref 13.0–17.0)
MCH: 26.2 pg (ref 26.0–34.0)
MCHC: 31.7 g/dL (ref 30.0–36.0)
MCV: 82.6 fL (ref 80.0–100.0)
Platelets: 428 K/uL — ABNORMAL HIGH (ref 150–400)
RBC: 3.4 MIL/uL — ABNORMAL LOW (ref 4.22–5.81)
RDW: 16.5 % — ABNORMAL HIGH (ref 11.5–15.5)
WBC: 7 K/uL (ref 4.0–10.5)
nRBC: 0 % (ref 0.0–0.2)

## 2024-06-24 LAB — GLUCOSE, CAPILLARY
Glucose-Capillary: 79 mg/dL (ref 70–99)
Glucose-Capillary: 97 mg/dL (ref 70–99)
Glucose-Capillary: 100 mg/dL — ABNORMAL HIGH (ref 70–99)

## 2024-06-24 MED ORDER — POLYETHYLENE GLYCOL 3350 17 G PO PACK
17.0000 g | PACK | Freq: Two times a day (BID) | ORAL | Status: DC
  Administered 2024-06-24 – 2024-06-26 (×4): 17 g via ORAL
  Filled 2024-06-24 (×5): qty 1

## 2024-06-24 MED ORDER — SODIUM CHLORIDE 0.9 % IV SOLN
3.0000 g | Freq: Three times a day (TID) | INTRAVENOUS | Status: DC
  Administered 2024-06-24 – 2024-06-26 (×7): 3 g via INTRAVENOUS
  Filled 2024-06-24 (×7): qty 8

## 2024-06-24 MED ORDER — SENNA 8.6 MG PO TABS
1.0000 | ORAL_TABLET | Freq: Two times a day (BID) | ORAL | Status: DC
  Administered 2024-06-24 – 2024-06-26 (×5): 8.6 mg via ORAL
  Filled 2024-06-24 (×5): qty 1

## 2024-06-24 MED ORDER — ASPIRIN 81 MG PO TBEC
81.0000 mg | DELAYED_RELEASE_TABLET | Freq: Every day | ORAL | Status: DC
  Administered 2024-06-24 – 2024-06-26 (×3): 81 mg via ORAL
  Filled 2024-06-24 (×3): qty 1

## 2024-06-24 NOTE — Discharge Instructions (Addendum)
 Dear Hezzie CHRISTELLA Daring,  Thank you for letting us  participate in your care. You were hospitalized for Symptomatic anemia and AKI and diagnosed with Symptomatic anemia. You were treated with blood transfusion.   POST-HOSPITAL & CARE INSTRUCTIONS Continue Lasix  40 mg Monday Wednesday Friday which we started during this admission  Continue to hold Hypertension-losartan  d/t AKI during this admission - can be restarting by your PCP  Go to your follow up appointments (listed below)  DOCTOR'S APPOINTMENT   No future appointments.  Contact information for follow-up providers     [name], MD. Schedule an appointment as soon as possible for a visit in 1 week(s).   Specialty: Family Medicine Contact information: [address] [place] [address] [phone]              Contact information for after-discharge care     Destination     [facility] .   Service: Skilled Nursing Contact information: [address] [place]  [address] [phone]                     Take care and be well!  Family Medicine Teaching Service Inpatient Team [place]  [facility]  [address] [place], [place] [address] [phone]

## 2024-06-24 NOTE — Assessment & Plan Note (Deleted)
 Creatinine yesterday a.m. continued to improve to 1.83.  -  Avoid nephrotoxic agents - Per nephro, held Losartan  and Lasix  MWF - Strict I/Os and daily weights

## 2024-06-24 NOTE — Assessment & Plan Note (Addendum)
 No bowel movement for 5-6 days. No abdominal TTP.  - Changed miralax  from PRN to scheduled BID and started Senna BID

## 2024-06-24 NOTE — Plan of Care (Signed)
   Problem: Education: Goal: Knowledge of General Education information will improve Description Including pain rating scale, medication(s)/side effects and non-pharmacologic comfort measures Outcome: Progressing   Problem: Health Behavior/Discharge Planning: Goal: Ability to manage health-related needs will improve Outcome: Progressing

## 2024-06-24 NOTE — Assessment & Plan Note (Deleted)
 Pt now with improved R scrotal pain and swelling. UA shows large leukocytes and many bacteria. Urine chlamydia/gonorrhea was negative. Covering for E. Coli as cause likely chronic indwelling catheter which has been discontinued. - 10 day course of Bactrim  800-160 mg BID (9/21-9/30)

## 2024-06-24 NOTE — Assessment & Plan Note (Addendum)
 Creatinine bumped to 2.26 this AM likely 2/2 Bactrim .  Denied decreased PO fluid intake.  Held Lasix  today in the setting of AKI. - Avoid nephrotoxic agents, will discuss abx regimen with ID, see below - Per nephro, held Losartan  and currently dosing Lasix  MWF - Strict I/Os and daily weights

## 2024-06-24 NOTE — Progress Notes (Signed)
   06/24/24 2250  BiPAP/CPAP/SIPAP  Reason BIPAP/CPAP not in use Other(comment) (Patient states he does not wear a CPAP)  BiPAP/CPAP /SiPAP Vitals  Pulse Rate 70  Resp 14  SpO2 100 %  Bilateral Breath Sounds Clear;Diminished  MEWS Score/Color  MEWS Score 0  MEWS Score Color Landy

## 2024-06-24 NOTE — Assessment & Plan Note (Addendum)
 Awaiting re-authorization to return to [facility].  Appeal in process for intermittent services.  Pending PT/OT re-eval.

## 2024-06-24 NOTE — Assessment & Plan Note (Addendum)
 History of stage IV pressure wound ulcer.  Previously treated with antibiotics and wound care at [facility].  Wound looks improved from previous pictures, will continue wound care as instructed previously. - WOC consulted

## 2024-06-24 NOTE — Assessment & Plan Note (Deleted)
 Nephrology initiated ESA on 9/20.  Asymptomatic this morning. Stable. - Monitor for signs of bleeding - Transfuse if hemoglobin < 8 (hx of CAD) - Aranesp  40 mcg started on 9/20 and will recur weekly

## 2024-06-24 NOTE — Progress Notes (Signed)
 Patient had a 6 beat run of vtach around 1720. This RN was made aware at 1900. EKG completed and vital signs obtained. EKG on patient chart. On call provider notified. No further orders obtained. Pt is alert and awake, denies any pain.

## 2024-06-24 NOTE — Assessment & Plan Note (Deleted)
 History of stage IV pressure wound ulcer.  Previously treated with antibiotics and wound care at [facility].  Wound looks improved from previous pictures, will continue wound care as instructed previously. - WOC consulted

## 2024-06-24 NOTE — Assessment & Plan Note (Deleted)
 Pt awaiting authorization to return to [facility]. PT reevaluation patient to determine whether pt needs intermittent vs full-spectrum services.

## 2024-06-24 NOTE — Progress Notes (Addendum)
 Daily Progress Note Intern Pager: [phone]  Patient name: Theodore Arellano Medical record number: [id number] Date of birth: 30-Oct-1960 Age: 63 y.o. Gender: male  Primary Care Provider: [name], MD Consultants: nephrology,  Code Status: Full  Pt Overview and Major Events to Date:  9/18: Admitted; 2u PRBC  9/20: Started on ESA (Aranesp  )  Medical Decision Making: Theodore Arellano is a 63 yo male with past medical history including ESRD s/p kidney transplant on immunosuppressives, unstable angina s/p CABG, complete heart block, paroxysmal A-fib, HTN, severe CAD, and T2DM who was admitted for symptomatic anemia and acute on chronic kidney injury. Hemoglobin has improved s/p transfusion and initiating ESA and improvement in AKI. Now medically stable for discharge, waiting SNF authorization.  Assessment & Plan Symptomatic anemia Nephrology initiated ESA on 9/20.  Asymptomatic this morning. Stable. - Monitor for signs of bleeding - Transfuse if hemoglobin < 8 (hx of CAD) - Aranesp  40 mcg started on 9/20 and will recur weekly AKI (acute kidney injury) History of renal transplant Creatinine bumped to 2.26 this AM likely 2/2 Bactrim .  Denied decreased PO fluid intake.  Held Lasix  today in the setting of AKI. 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  Previously treated with antibiotics and wound care at [facility].  Wound looks improved from previous pictures, will continue wound care as instructed previously. - WOC consulted Orchitis and epididymitis Pt now with improved R scrotal pain and swelling. UA shows large leukocytes and many bacteria. Urine chlamydia/gonorrhea was negative.  Urine culture demonstrated send Acinetobacter calcoaceticus/Baumanii complex resistant to  Meropenem  and Zosyn , but sensitive to Unasyn . - Continuing Bactrim  for possible 10d coverage for orchitis/epididymitis currently - Consulted Dr. Overton with ID, discussed culture and sensitivities, appreciate recommendations to modify abx for better coverage Intermittent complete heart block (HCC) Sleep apnea Noted during his last admission, evaluated by cardiology.  He is not a candidate for transvenous pacing or pacemaker, and cardiology recommended transition to comfort care.  Patient was adamant about being full code during this admission. - Telemetry - CPAP at night, transition to BiPAP if necessary - Ongoing goals of care conversation Constipation No bowel movement for 5-6 days. No abdominal TTP.  - Changed miralax  from PRN to scheduled BID and started Senna BID Person awaiting admission to adequate facility elsewhere Awaiting re-authorization to return to [facility]  Appeal in process for intermittent services.  Pending PT/OT re-eval. 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  Unstable angina / CAD / PAD: Restarted home ASA in the setting of stable Hgb HTN: Amlodipine  5 mg daily. Held other meds 2/2 AKI. HLD: Rosuvastatin  20 mg daily T2DM: last A1c 6.4, not on medications.  Monitor CBGs, appear stable while on prednisone .  FEN/GI: Heart Healthy diet PPx: SCDs Dispo:SNF today. Barriers include insurance authorization.   Subjective:  No overnight events. Pt states that he is feeling well, though he is concerned about not having a bowel movement for nearly a week. Reports improvement to the arthritic pain in his knee. Denies  pain to his scrotum. Denies abdominal pain, fever, chills, new cough, or any other  medical concerns at this time. He understands that he is waiting on authorization from insurance to go to SNF. Pt states that he thinks there was a lot of mold at [facility].   Objective: Temp:  [98 F (36.7 C)-98.4 F (36.9 C)] 98.2 F (36.8 C) (09/24 0506) Pulse Rate:  [66-76] 68 (09/24 0506) Resp:  [18-20] 18 (09/24 0506) BP: (141-161)/(68-91) 141/68 (09/24 0506) SpO2:  [100 %] 100 % (09/24 0506) Physical Exam: General: NAD. Nontoxic appearing Cardiovascular: regular rate and rhythm Respiratory: lungs clear to auscultation bilaterally Abdomen: soft, nontender, nondistended Extremities: moving all limbs normally according to baseline. No edema present.   Laboratory: Most recent CBC Lab Results  Component Value Date   WBC 7.0 06/24/2024   HGB 8.9 (L) 06/24/2024   HCT 28.1 (L) 06/24/2024   MCV 82.6 06/24/2024   PLT 428 (H) 06/24/2024   Most recent BMP    Latest Ref Rng & Units 06/24/2024    4:25 AM  BMP  Glucose 70 - 99 mg/dL 82   BUN 8 - 23 mg/dL 33   Creatinine 9.38 - 1.24 mg/dL 7.73   Sodium 864 - 854 mmol/L 136   Potassium 3.5 - 5.1 mmol/L 4.7   Chloride 98 - 111 mmol/L 106   CO2 22 - 32 mmol/L 20   Calcium  8.9 - 10.3 mg/dL 9.2     Imaging/Diagnostic Tests: No new imaging acquired.   [name], Medical Student 06/24/2024, 7:50 AM  MS4 AI, [facility] FPTS Intern pager: [phone], text pages welcome Secure chat group [facility] Teaching Service   I have discussed the above with Student Doctor Casimir and agree with the documented plan. My edits for correction/addition/clarification are included above. Please see any attending notes.   Kathrine Melena, DO PGY-2,  Family Medicine 06/24/2024 1:00 PM

## 2024-06-24 NOTE — Assessment & Plan Note (Deleted)
 Noted during his last admission, evaluated by cardiology.  He is not a candidate for transvenous pacing or pacemaker, and cardiology recommended transition to comfort care.  Patient was adamant about being full code during this admission. - Telemetry - CPAP at night, transition to BiPAP if necessary - Ongoing goals of care conversation

## 2024-06-24 NOTE — Assessment & Plan Note (Addendum)
 Noted during his last admission, evaluated by cardiology.  He is not a candidate for transvenous pacing or pacemaker, and cardiology recommended transition to comfort care.  Patient was adamant about being full code during this admission. - Telemetry - CPAP at night, transition to BiPAP if necessary - Ongoing goals of care conversation

## 2024-06-24 NOTE — Assessment & Plan Note (Addendum)
 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  Unstable angina / CAD / PAD: Restarted home ASA in the setting of stable Hgb HTN: Amlodipine  5 mg daily. Held other meds 2/2 AKI. HLD: Rosuvastatin  20 mg daily T2DM: last A1c 6.4, not on medications.  Monitor CBGs, appear stable while on prednisone .

## 2024-06-24 NOTE — Assessment & Plan Note (Deleted)
 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 Held other meds 2/2 AKI. PAD: ASA currently holding in the setting of symptomatic anemia HLD: Rosuvastatin  20 mg daily T2DM: last A1c 6.4, not on medications.  Monitor CBGs, appear stable while on prednisone .

## 2024-06-24 NOTE — Assessment & Plan Note (Deleted)
 Pt has not had a bowel movement for 4-5 days. On chart review, pt has not gotten his PRN Miralax . No abdominal TTP.  - PRN miralax  given.

## 2024-06-24 NOTE — Plan of Care (Signed)
 Curb-sided Dr. Marlee (Nephro) regarding Myfortic  dosing per Dr. Chapman (ID) recommendation in the setting of orchitis/epididymitis with urine culture demonstrating Acinetobacter calcoaceticus/Baumanii complex resistant to Meropenem  and Zosyn . Dr. Marlee recommended holding Myfortic  for 7 days to see if there is clinical improvement, will resume at that time.  Appreciate the recommendations of both ID and Nephro.  Kathrine Melena, DO 06/24/24 2:12 PM

## 2024-06-24 NOTE — Assessment & Plan Note (Addendum)
 Pt now with improved R scrotal pain and swelling. UA shows large leukocytes and many bacteria. Urine chlamydia/gonorrhea was negative.  Urine culture demonstrated send Acinetobacter calcoaceticus/Baumanii complex resistant to Meropenem  and Zosyn , but sensitive to Unasyn . - Continuing Bactrim  for possible 10d coverage for orchitis/epididymitis currently - Consulted Dr. Overton with ID, discussed culture and sensitivities, appreciate recommendations to modify abx for better coverage

## 2024-06-24 NOTE — Assessment & Plan Note (Addendum)
 Nephrology initiated ESA on 9/20.  Asymptomatic this morning. Stable. - Monitor for signs of bleeding - Transfuse if hemoglobin < 8 (hx of CAD) - Aranesp  40 mcg started on 9/20 and will recur weekly

## 2024-06-24 NOTE — Consult Note (Signed)
 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 PVD s/p left femoral to posterior tibial artery bypass 07/2019 CAD s/p cabg 05/2019 Hx cva Htn/hlp   Renal team following for aki/ckd Renal transplant chronic prograf , myfortic , prednisone  currently on home dose  Micro: 9/21 urine gc/chlam negative 9/22 ucx >100k acinetobacter baumannii complex (S amp/sulb) >100k e faecalis   Plan: Amp/sulb Extended card antibiotics testing pending for acinetobacter  Please discuss with renal regarding temporary dose decrease for myfortic  given his current infection Standard isolation precaution Discussed with primary team      ------------------------------------------------ Principal Problem:   Symptomatic anemia Active Problems:   History of renal transplant   AKI (acute kidney injury)   Chronic health problem   Intermittent complete heart block (HCC)   Sacral wound   Acute kidney injury   Sleep apnea   Chronic ulcer of left heel (HCC)   Kidney transplant recipient   Anemia of renal disease   Orchitis and epididymitis   Person awaiting admission to adequate facility elsewhere   Epididymitis   Constipation    HPI: Theodore Arellano is a 63 y.o. male id asked to evaluate for orchitis   Patient admitted 9/18 with anemia He was transfused here  He has had no fever, chill He was found to have right testicular pain/swelling while being bathed here and team workuped showing orchitis Urine cx acinetobacter and  e faecalis U/s imaging showed orcihtis/epididymitis  Renal following for aki on ckd   Renal transplant ddkt 2011 Uncomplicated course and has been maintained on stable dose prograf /myfortic /prednisone   He has no other complaint  He was started empirically on bactrim   He reports his testicular pain is slightly better    Family History  Problem Relation Age of Onset   Hypertension Father    Diabetes Mother     Social History   Tobacco Use   Smoking status: Every Day    Current packs/day: 0.50    Average packs/day: 0.5 packs/day for 40.0 years (20.0 ttl pk-yrs)    Types: Cigarettes   Smokeless tobacco: Never  Vaping Use   Vaping status: Never Used  Substance Use Topics   Alcohol use: Yes    Alcohol/week: 12.0 standard drinks of alcohol    Types: 12 Cans of beer per week   Drug use: No    Allergies  Allergen Reactions   Percocet [Oxycodone -Acetaminophen ] Hives and Itching   Vicodin [Hydrocodone -Acetaminophen ] Hives and Itching   Shellfish Allergy Hives   Chocolate Flavoring Agent (Non-Screening) Hives   Fish Allergy    Chocolate Hives   Tomato Hives    Review of Systems: ROS All Other ROS was negative, except mentioned above   Past Medical History:  Diagnosis Date   Abnormality of gait following cerebrovascular accident (CVA)    Anxiety    Arthritis    lower back (11/28/2017)   CAD (coronary artery disease)    Chronic lower back pain    H/O immunosuppressive therapy  chronic/notes 11/28/2017   History of gout    before kidney transport (11/28/2017)   Hyperlipidemia    Hypertension    Kidney disease    s/p kidney transplant 2011; not on dialysis now (11/28/2017)   Peripheral arterial disease    Stroke (HCC)    Tobacco use        Scheduled Meds:  allopurinol   100 mg Oral Daily   amLODipine   5 mg Oral Daily   aspirin  EC  81 mg Oral Daily   ferrous sulfate   325 mg Oral Q breakfast   furosemide   40 mg Oral Q M,W,F   leptospermum manuka  honey  1 Application Topical Daily   mycophenolate   720 mg Oral BID   polyethylene glycol  17 g Oral BID   predniSONE   5 mg Oral Daily   rosuvastatin   20 mg Oral Daily   senna  1 tablet Oral BID   sulfamethoxazole -trimethoprim   1 tablet Oral Q12H   tacrolimus   2 mg Oral Daily   tacrolimus   3 mg Oral Daily   tamsulosin   0.8 mg Oral Daily   Continuous Infusions: PRN Meds:.acetaminophen , diphenhydrAMINE , HYDROmorphone , ondansetron , mouth rinse, mouth rinse   OBJECTIVE: Blood pressure (!) 176/78, pulse 63, temperature 98 F (36.7 C), temperature source Oral, resp. rate 19, height 5' 7 (1.702 m), weight 77 kg, SpO2 100%.  Physical Exam General/constitutional: no distress, pleasant HEENT: Normocephalic, PER, Conj Clear, EOMI, Oropharynx clear Neck supple CV: rrr no mrg Lungs: clear to auscultation, normal respiratory effort Abd: Soft, Nontender Ext: no edema Skin: No Rash Neuro: nonfocal MSK: no peripheral joint swelling/tenderness/warmth; back spines nontender  Gu: right testicle showed firm linear density posteriorly and tender to palpation    Lab Results Lab Results  Component Value Date   WBC 7.0 06/24/2024   HGB 8.9 (L) 06/24/2024   HCT 28.1 (L) 06/24/2024   MCV 82.6 06/24/2024   PLT 428 (H) 06/24/2024    Lab Results  Component Value Date   CREATININE 2.26 (H) 06/24/2024   BUN 33 (H) 06/24/2024   NA 136 06/24/2024   K 4.7 06/24/2024   CL 106 06/24/2024   CO2 20 (L) 06/24/2024    Lab Results  Component Value Date   ALT 8 06/18/2024   AST 13 (L) 06/18/2024   ALKPHOS 65 06/18/2024   BILITOT 0.4 06/18/2024      Microbiology: Recent Results (from the past 240 hours)  MRSA Next Gen by PCR, Nasal     Status: None   Collection Time: 06/19/24  6:38 AM   Specimen: Nasal Mucosa; Nasal Swab  Result Value Ref Range Status   MRSA by PCR Next Gen NOT DETECTED NOT DETECTED Final    Comment: (NOTE) The GeneXpert MRSA Assay (FDA approved for NASAL specimens  only), is one component of a comprehensive MRSA colonization surveillance program. It is not intended to diagnose MRSA infection nor to guide or monitor treatment for MRSA infections. Test performance is not FDA approved in patients less than 23 years old. Performed at [facility], [address], [place], [place] [address]   Urine Culture     Status: Abnormal (Preliminary result)   Collection Time: 06/22/24  4:42 PM   Specimen: Urine, Random  Result Value Ref Range Status   Specimen Description URINE, RANDOM  Final   Special Requests NONE Reflexed from [id number]  Final   Culture (A)  Final    >=100,000 COLONIES/mL ACINETOBACTER CALCOACETICUS/BAUMANNII COMPLEX >=100,000 COLONIES/mL ENTEROCOCCUS FAECALIS SUSCEPTIBILITIES TO  FOLLOW Performed at [facility], [address], [place], [place] [address]    Report Status PENDING  Incomplete   Organism ID, Bacteria ACINETOBACTER CALCOACETICUS/BAUMANNII COMPLEX (A)  Final      Susceptibility   Acinetobacter calcoaceticus/baumannii complex - MIC*    PIP/TAZO Value in next row Resistant      >=128 RESISTANTThis is a modified FDA-approved test that has been validated and its performance characteristics determined by the reporting laboratory.  This laboratory is certified under the Clinical Laboratory Improvement Amendments CLIA as qualified to perform high complexity clinical laboratory testing.    AMPICILLIN /SULBACTAM Value in next row Sensitive      >=128 RESISTANTThis is a modified FDA-approved test that has been validated and its performance characteristics determined by the reporting laboratory.  This laboratory is certified under the Clinical Laboratory Improvement Amendments CLIA as qualified to perform high complexity clinical laboratory testing.    MEROPENEM  Value in next row Resistant      >=128 RESISTANTThis is a modified FDA-approved test that has been validated and its performance characteristics determined by the reporting  laboratory.  This laboratory is certified under the Clinical Laboratory Improvement Amendments CLIA as qualified to perform high complexity clinical laboratory testing.    * >=100,000 COLONIES/mL ACINETOBACTER CALCOACETICUS/BAUMANNII COMPLEX     Serology:    Imaging: If present, new imagings (plain films, ct scans, and mri) have been personally visualized and interpreted; radiology reports have been reviewed. Decision making incorporated into the Impression / Recommendations.  9/18 renal transplant u/s 1. Stable left lower quadrant transplant kidney, with stable pelvicaliectasis. 2. Normal Doppler evaluation.    9/21 scrotal u/s with doppler 1. No evidence of testicular torsion. 2. Findings compatible with orchitis and epididymitis on the right with moderate complex hydrocele on the right. 3. Bilateral epididymal head cysts.    Constance ONEIDA Passer, MD [facility] [phone] pager    06/24/2024, 1:04 PM

## 2024-06-25 DIAGNOSIS — D649 Anemia, unspecified: Secondary | ICD-10-CM | POA: Diagnosis not present

## 2024-06-25 LAB — CBC
HCT: 29.5 % — ABNORMAL LOW (ref 39.0–52.0)
Hemoglobin: 9.3 g/dL — ABNORMAL LOW (ref 13.0–17.0)
MCH: 26.1 pg (ref 26.0–34.0)
MCHC: 31.5 g/dL (ref 30.0–36.0)
MCV: 82.9 fL (ref 80.0–100.0)
Platelets: 468 K/uL — ABNORMAL HIGH (ref 150–400)
RBC: 3.56 MIL/uL — ABNORMAL LOW (ref 4.22–5.81)
RDW: 16.3 % — ABNORMAL HIGH (ref 11.5–15.5)
WBC: 6.4 K/uL (ref 4.0–10.5)
nRBC: 0 % (ref 0.0–0.2)

## 2024-06-25 LAB — BASIC METABOLIC PANEL WITH GFR
Anion gap: 13 (ref 5–15)
BUN: 34 mg/dL — ABNORMAL HIGH (ref 8–23)
CO2: 20 mmol/L — ABNORMAL LOW (ref 22–32)
Calcium: 9.7 mg/dL (ref 8.9–10.3)
Chloride: 107 mmol/L (ref 98–111)
Creatinine, Ser: 2.47 mg/dL — ABNORMAL HIGH (ref 0.61–1.24)
GFR, Estimated: 28 mL/min — ABNORMAL LOW (ref >60)
Glucose, Bld: 81 mg/dL (ref 70–99)
Potassium: 4.7 mmol/L (ref 3.5–5.1)
Sodium: 140 mmol/L (ref 135–145)

## 2024-06-25 LAB — GLUCOSE, CAPILLARY
Glucose-Capillary: 96 mg/dL (ref 70–99)
Glucose-Capillary: 145 mg/dL — ABNORMAL HIGH (ref 70–99)

## 2024-06-25 NOTE — Progress Notes (Addendum)
 Daily Progress Note Intern Pager: [phone]  Patient name: Theodore Arellano Medical record number: [id number] Date of birth: Aug 10, 1961 Age: 63 y.o. Gender: male  Primary Care Provider: [name], MD Consultants: Nephrology, ID Code Status: Full  Pt Overview and Major Events to Date:  9/18: Admitted; 2u PRBC  9/20: Started on ESA (Aranesp  )  Medical Decision Making: Theodore Arellano is a 63 yo male with past medical history including ESRD s/p kidney transplant on immunosuppressives, unstable angina s/p CABG, complete heart block, paroxysmal A-fib, HTN, severe CAD, and T2DM who was admitted for symptomatic anemia and acute on chronic kidney injury. Hemoglobin has improved s/p transfusion and initiating ESA and improvement in AKI. Now medically stable for discharge, waiting SNF authorization.  Assessment & Plan Symptomatic anemia Nephrology initiated ESA on 9/20.  Asymptomatic this morning. Stable and continuing to improve. - Monitor for signs of bleeding - Transfuse if hemoglobin < 8 (hx of CAD) - Aranesp  40 mcg started on 9/20 and will recur weekly Orchitis and epididymitis Pt now with improved R scrotal pain and swelling. UA shows large leukocytes and many bacteria. Urine chlamydia/gonorrhea was negative.  Urine culture demonstrated send Acinetobacter calcoaceticus/Baumanii complex resistant to Meropenem  and Zosyn , but sensitive to Unasyn . - stopped Bactrim  in the setting of Urine culture findings, susceptibilities, and AKI   - Consulted Dr. Overton with ID who suggested Unasyn  coverage and to discuss transplant immunotherapy with nephrology. AKI (acute kidney injury) History of renal transplant Creatinine is 2.47 this AM likely 2/2 Bactrim , now on Unasyn .  Denied decreased PO fluid intake.   - Avoid nephrotoxic agents, discussed abx regimen with ID, see above - Per nephro, held Losartan  -- Held lasix  in setting of AKI - Strict I/Os and daily weights -- Holding  mycophenolate  for 7 days (9/25-10/1) per Dr. Marlee (Nephro) given infection as above Sacral wound Chronic ulcer of left heel (HCC) History of stage IV pressure wound ulcer.  Previously treated with antibiotics and wound care at [facility].  Wound looks improved from previous pictures, will continue wound care as instructed previously. - WOC consulted Intermittent complete heart block (HCC) Sleep apnea Noted during his last admission, evaluated by cardiology.  He is not a candidate for transvenous pacing or pacemaker, and cardiology recommended transition to comfort care.  Patient was adamant about being full code during this admission. - Telemetry - CPAP at night, transition to BiPAP if necessary - Ongoing goals of care conversation Constipation No bowel movement for 5-6 days. No abdominal TTP.  - Continued scheduled miralax  BID, Senna BID -- Ordered soap suds enema Person awaiting admission to adequate facility elsewhere Awaiting re-authorization to return to [facility].  Appeal in process for intermittent services.  Pending PT/OT re-eval. Chronic health problem HFmrEF: Euvolemic on exam Paroxysmal atrial fibrillation: Held xarelto  per cards recs and symptomatic anemia (cards note 05/18/2024 says not to [facility] in the setting of hematuria and doesn't think continuing [facility] is warranted at the time), however SNF confirmed he was taking this at their facility.  Unstable angina / CAD / PAD: Restarted home ASA in the setting of stable Hgb HTN: Amlodipine  5 mg daily. Held other meds 2/2 AKI. HLD: Rosuvastatin  20 mg daily T2DM: last A1c 6.4, not on medications.  Monitor CBGs, appear stable while on prednisone .   FEN/GI: Heart healthy Diet PPx: SCDs Dispo:SNF today. Barriers include pending authorization appeal.   Subjective:  Pt states he feels well. Denies having a bowel movement. Denies abdominal pain.  Denies testicular pain, chest pain, or palpitations. Denies any other medical concerns at  this time.   Objective: Temp:  [97.6 F (36.4 C)-98 F (36.7 C)] 98 F (36.7 C) (09/25 0414) Pulse Rate:  [63-79] 79 (09/25 0414) Resp:  [14-19] 18 (09/25 0414) BP: (150-176)/(78-85) 152/84 (09/25 0414) SpO2:  [100 %] 100 % (09/25 0414) Physical Exam: General: NAD, nontoxic appearing Cardiovascular: regular rate and rhythm Respiratory: lungs clear to auscultation bilaterally without wheezes or rhonchi Abdomen: soft, nontender. Nondistended GU: R testes with sizable swelling compared to L testes. R testicle is tender. No tenderness.  Extremities: moving all extremities normally according to baseline. No edema.  Laboratory: Most recent CBC Lab Results  Component Value Date   WBC 6.4 06/25/2024   HGB 9.3 (L) 06/25/2024   HCT 29.5 (L) 06/25/2024   MCV 82.9 06/25/2024   PLT 468 (H) 06/25/2024   Most recent BMP    Latest Ref Rng & Units 06/25/2024    4:20 AM  BMP  Glucose 70 - 99 mg/dL 81   BUN 8 - 23 mg/dL 34   Creatinine 9.38 - 1.24 mg/dL 7.52   Sodium 864 - 854 mmol/L 140   Potassium 3.5 - 5.1 mmol/L 4.7   Chloride 98 - 111 mmol/L 107   CO2 22 - 32 mmol/L 20   Calcium  8.9 - 10.3 mg/dL 9.7     Imaging/Diagnostic Tests: No new imaging acquired.  [name], Medical Student 06/25/2024, 7:33 AM  MS4 AI, [facility] FPTS Intern pager: [phone], text pages welcome Secure chat group [facility] Teaching Service    I agree with the assessment and plan as documented above.  Stuart Redo, MD PGY-3, [facility]

## 2024-06-25 NOTE — Plan of Care (Signed)
 Theodore Arellano did well today. She was very interactive with his care and was able to progress with his bowel regiment, remaining pain free, and avoiding urinary retention. He is aware of his acute infection and understands the need for treatment. He is concerned about his placement post discharge but remains hopeful.

## 2024-06-25 NOTE — Progress Notes (Signed)
 PHARMACY CONSULT NOTE FOR:  OUTPATIENT  PARENTERAL ANTIBIOTIC THERAPY (OPAT)  Indication: Orchitis/epididymitis with Acinetobacter baumannii complex and Enterococcus faecalis on urine culture (06/22/24) Regimen: ampicillin /sulbactam IV 3g q8h  End date: 07/08/2024  IV antibiotic discharge orders are pended. To discharging provider:  please sign these orders via discharge navigator,  Select New Orders & click on the button choice - Manage This Unsigned Work.    Thank you for allowing pharmacy to be a part of this patient's care.  Feliciano Close, PharmD PGY2 Infectious Diseases Pharmacy Resident  06/25/2024 3:12 PM

## 2024-06-25 NOTE — Assessment & Plan Note (Addendum)
 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  Unstable angina / CAD / PAD: Restarted home ASA in the setting of stable Hgb HTN: Amlodipine  5 mg daily. Held other meds 2/2 AKI. HLD: Rosuvastatin  20 mg daily T2DM: last A1c 6.4, not on medications.  Monitor CBGs, appear stable while on prednisone .

## 2024-06-25 NOTE — Plan of Care (Signed)
 Id brief note   Ucx with extended abx susceptibility panel given Acinetobacter (S amp-sulb, and mino) E faecalis   A/p Orchitis/epididymitis   -finish 2 weeks abx for acute process from 9/24-10/08 -patient going to snf and can do for renal function as is, amp-sulb 3 gram iv q8hr -minocycline is not a good medication for gu process -no need to f/u id clinic -discussed with primary team

## 2024-06-25 NOTE — Assessment & Plan Note (Addendum)
 Creatinine is 2.47 this AM likely 2/2 Bactrim , now on Unasyn .  Denied decreased PO fluid intake.   - Avoid nephrotoxic agents, discussed abx regimen with ID, see above - Per nephro, held Losartan  -- Held lasix  in setting of AKI - Strict I/Os and daily weights -- Holding mycophenolate  for 7 days (9/25-10/1) per Dr. Marlee (Nephro) given infection as above

## 2024-06-25 NOTE — Progress Notes (Signed)
   06/24/24 2029  Provider Notification  Provider Name/Title Bronson,DO  Date Provider Notified 06/24/24  Time Provider Notified 2049  Method of Notification Page  Notification Reason New onset of dysrhythmia (27 beat VTach)  Provider response No new orders  Date of Provider Response 06/24/24  Time of Provider Response 2052

## 2024-06-25 NOTE — Assessment & Plan Note (Addendum)
 History of stage IV pressure wound ulcer.  Previously treated with antibiotics and wound care at [facility].  Wound looks improved from previous pictures, will continue wound care as instructed previously. - WOC consulted

## 2024-06-25 NOTE — Assessment & Plan Note (Addendum)
 Noted during his last admission, evaluated by cardiology.  He is not a candidate for transvenous pacing or pacemaker, and cardiology recommended transition to comfort care.  Patient was adamant about being full code during this admission. - Telemetry - CPAP at night, transition to BiPAP if necessary - Ongoing goals of care conversation

## 2024-06-25 NOTE — Assessment & Plan Note (Addendum)
 No bowel movement for 5-6 days. No abdominal TTP.  - Continued scheduled miralax  BID, Senna BID -- Ordered soap suds enema

## 2024-06-25 NOTE — Assessment & Plan Note (Addendum)
 Awaiting re-authorization to return to [facility].  Appeal in process for intermittent services.  Pending PT/OT re-eval.

## 2024-06-25 NOTE — Assessment & Plan Note (Addendum)
 Pt now with improved R scrotal pain and swelling. UA shows large leukocytes and many bacteria. Urine chlamydia/gonorrhea was negative.  Urine culture demonstrated send Acinetobacter calcoaceticus/Baumanii complex resistant to Meropenem  and Zosyn , but sensitive to Unasyn . - stopped Bactrim  in the setting of Urine culture findings, susceptibilities, and AKI   - Consulted Dr. Overton with ID who suggested Unasyn  coverage and to discuss transplant immunotherapy with nephrology.

## 2024-06-25 NOTE — Assessment & Plan Note (Addendum)
 Nephrology initiated ESA on 9/20.  Asymptomatic this morning. Stable and continuing to improve. - Monitor for signs of bleeding - Transfuse if hemoglobin < 8 (hx of CAD) - Aranesp  40 mcg started on 9/20 and will recur weekly

## 2024-06-26 DIAGNOSIS — D649 Anemia, unspecified: Secondary | ICD-10-CM | POA: Diagnosis not present

## 2024-06-26 LAB — CBC
HCT: 28.4 % — ABNORMAL LOW (ref 39.0–52.0)
Hemoglobin: 8.9 g/dL — ABNORMAL LOW (ref 13.0–17.0)
MCH: 26.3 pg (ref 26.0–34.0)
MCHC: 31.3 g/dL (ref 30.0–36.0)
MCV: 83.8 fL (ref 80.0–100.0)
Platelets: 487 K/uL — ABNORMAL HIGH (ref 150–400)
RBC: 3.39 MIL/uL — ABNORMAL LOW (ref 4.22–5.81)
RDW: 16.6 % — ABNORMAL HIGH (ref 11.5–15.5)
WBC: 6.3 K/uL (ref 4.0–10.5)
nRBC: 0 % (ref 0.0–0.2)

## 2024-06-26 LAB — BASIC METABOLIC PANEL WITH GFR
Anion gap: 13 (ref 5–15)
BUN: 33 mg/dL — ABNORMAL HIGH (ref 8–23)
CO2: 18 mmol/L — ABNORMAL LOW (ref 22–32)
Calcium: 9.2 mg/dL (ref 8.9–10.3)
Chloride: 105 mmol/L (ref 98–111)
Creatinine, Ser: 2.11 mg/dL — ABNORMAL HIGH (ref 0.61–1.24)
GFR, Estimated: 34 mL/min — ABNORMAL LOW (ref >60)
Glucose, Bld: 80 mg/dL (ref 70–99)
Potassium: 4.8 mmol/L (ref 3.5–5.1)
Sodium: 136 mmol/L (ref 135–145)

## 2024-06-26 LAB — GLUCOSE, CAPILLARY
Glucose-Capillary: 88 mg/dL (ref 70–99)
Glucose-Capillary: 84 mg/dL (ref 70–99)
Glucose-Capillary: 111 mg/dL — ABNORMAL HIGH (ref 70–99)

## 2024-06-26 MED ORDER — CALCIUM CARBONATE ANTACID 500 MG PO CHEW
1.0000 | CHEWABLE_TABLET | Freq: Every day | ORAL | Status: DC
  Administered 2024-06-26: 200 mg via ORAL
  Filled 2024-06-26: qty 1

## 2024-06-26 MED ORDER — AMPICILLIN-SULBACTAM IV (FOR PTA / DISCHARGE USE ONLY): 3.0000 g | Freq: Three times a day (TID) | INTRAVENOUS | Status: AC

## 2024-06-26 MED ORDER — SODIUM CHLORIDE 0.9% FLUSH: 10.0000 mL | INTRAVENOUS | Status: DC | PRN

## 2024-06-26 MED ORDER — SODIUM CHLORIDE 0.9% FLUSH: 10.0000 mL | Freq: Two times a day (BID) | INTRAVENOUS | Status: DC

## 2024-06-26 NOTE — Assessment & Plan Note (Addendum)
 Pt with stable R scrotal pain and swelling. UA shows large leukocytes and many bacteria. Urine chlamydia/gonorrhea was negative.  Urine culture demonstrated send Acinetobacter calcoaceticus/Baumanii complex resistant to Meropenem  and Zosyn , but sensitive to Unasyn . - stopped Bactrim  in the setting of Urine culture findings, susceptibilities, and AKI   - Consulted Dr. Overton with ID who suggested Unasyn  coverage and managed anti-rejection immunotherapy with nephrology as noted below.

## 2024-06-26 NOTE — Assessment & Plan Note (Signed)
 Noted during his last admission, evaluated by cardiology.  He is not a candidate for transvenous pacing or pacemaker, and cardiology recommended transition to comfort care.  Patient was adamant about being full code during this admission. - Telemetry - CPAP at night, transition to BiPAP if necessary - Ongoing goals of care conversation

## 2024-06-26 NOTE — Assessment & Plan Note (Signed)
 Creatinine is improving from 2.47 > 2.11 this AM, now on Unasyn . Baseline likely ~1.8   - Avoid nephrotoxic agents, discussed abx regimen with ID, see above - Per nephro, held Losartan  -- Held lasix  in setting of AKI - Strict I/Os and daily weights -- Holding mycophenolate  for 7 days (9/25-10/1) per Dr. Marlee (Nephro) given infection as above

## 2024-06-26 NOTE — TOC Progression Note (Addendum)
 Transition of Care [facility]) - Progression Note    Patient Details  Name: Theodore Arellano MRN: [id number] Date of Birth: Dec 11, 1960  [facility] CM/SW Contact  Lendia Dais, [place] Phone Number: 06/26/2024, 9:38 AM  Clinical Narrative:  CSW spoke with Tammy of [facility]. Tammy stated that they could still receive the pt while their insurance appeal decision is pending. Tammy stated that if it is denied the pt secondary insurance would kick in and that the cost would be drawn from their social security. Tammy stated that the patient is aware.   CSW spoke to patient and informed them that [facility] will be good to take them back once they are medically ready. Pt was agreeable and inquired about their belongings at [place]. CSW spoke to [place] and stated that the nursing staff could help with transporting belongings.  CSW informed pt of PT stating that the pt could travel by car. Pt stated his cousin or family could give them a ride.  CSW will continue to monitor.    Expected Discharge Plan: Skilled Nursing Facility Barriers to Discharge: Insurance Authorization               Expected Discharge Plan and Services       Living arrangements for the past 2 months: Skilled Nursing Facility                                       Social Drivers of Health (SDOH) Interventions SDOH Screenings   Food Insecurity: No Food Insecurity (06/19/2024)  Housing: Low Risk  (06/19/2024)  Transportation Needs: No Transportation Needs (06/19/2024)  Recent Concern: Transportation Needs - Unmet Transportation Needs (05/02/2024)  Utilities: Not At Risk (06/19/2024)  Tobacco Use: High Risk (06/19/2024)    Readmission Risk Interventions     No data to display

## 2024-06-26 NOTE — Progress Notes (Signed)
 Daily Progress Note Intern Pager: [phone]  Patient name: Theodore Arellano Medical record number: [id number] Date of birth: 19-May-1961 Age: 63 y.o. Gender: male  Primary Care Provider: [name], MD Consultants: Nephrology, ID Code Status: Full  Pt Overview and Major Events to Date:  9/18: Admitted; 2u PRBC  9/20: Started on ESA (Aranesp  )  Medical Decision Making:  Theodore Arellano is a 62 yo male with past medical history including ESRD s/p kidney transplant on immunosuppressives, unstable angina s/p CABG, complete heart block, paroxysmal A-fib, HTN, severe CAD, and T2DM who was admitted for symptomatic anemia and acute on chronic kidney injury. Hemoglobin has improved s/p transfusion and initiating ESA and improvement in AKI. Now medically stable for discharge, waiting SNF authorization.  Assessment & Plan Symptomatic anemia Nephrology initiated ESA on 9/20.  Asymptomatic this morning. Stable. - Monitor for signs of bleeding - Transfuse if hemoglobin < 8 (hx of CAD) - Aranesp  40 mcg started on 9/20 and will recur weekly Orchitis and epididymitis Pt with stable R scrotal pain and swelling. UA shows large leukocytes and many bacteria. Urine chlamydia/gonorrhea was negative.  Urine culture demonstrated send Acinetobacter calcoaceticus/Baumanii complex resistant to Meropenem  and Zosyn , but sensitive to Unasyn . - stopped Bactrim  in the setting of Urine culture findings, susceptibilities, and AKI   - Consulted Dr. Overton with ID who suggested Unasyn  coverage and managed anti-rejection immunotherapy with nephrology as noted below.  AKI (acute kidney injury) History of renal transplant Creatinine is improving from 2.47 > 2.11 this AM, now on Unasyn . Baseline likely ~1.8   - Avoid nephrotoxic agents, discussed abx regimen with ID, see above - Per nephro, held Losartan  -- Held lasix  in setting of AKI - Strict I/Os and daily weights -- Holding mycophenolate  for 7 days (9/25-10/1)  per Dr. Marlee (Nephro) given infection as above Sacral wound Chronic ulcer of left heel (HCC) History of stage IV pressure wound ulcer.  Previously treated with antibiotics and wound care at [facility].  Wound looks improved from previous pictures, will continue wound care as instructed previously. - WOC consulted Intermittent complete heart block (HCC) Sleep apnea Noted during his last admission, evaluated by cardiology.  He is not a candidate for transvenous pacing or pacemaker, and cardiology recommended transition to comfort care.  Patient was adamant about being full code during this admission. - Telemetry - CPAP at night, transition to BiPAP if necessary - Ongoing goals of care conversation Constipation  Pt passed a large BM yesterday.  - Continued scheduled miralax  BID, Senna BID Person awaiting admission to adequate facility elsewhere Awaiting re-authorization to return to [facility].  Appeal authorization was approved today. Chronic health problem HFmrEF: Euvolemic on exam Paroxysmal atrial fibrillation: Held xarelto  per cards recs and symptomatic anemia (cards note 05/18/2024 says not to [facility] in the setting of hematuria and doesn't think continuing [facility] is warranted at the time), however SNF confirmed he was taking this at their facility.  Unstable angina / CAD / PAD: Restarted home ASA in the setting of stable Hgb HTN: Amlodipine  5 mg daily. Held other meds 2/2 AKI. HLD: Rosuvastatin  20 mg daily T2DM: last A1c 6.4, not on medications.  Monitor CBGs, appear stable while on prednisone .  FEN/GI: Renal Diet PPx: SCD's Dispo:SNF today. Barriers include insurance authorization.   Subjective:  Pt feels well. He denies any pain. States that his testicles are not painful today. The right testicle still feels heavy and swollen. Endorses a large BM yesterday and states that  he feels relieved. Denies any abdominal pain, chest discomfort, fever, chills. Denies any other medical concerns at  this time.   Objective: Temp:  [97.4 F (36.3 C)-97.6 F (36.4 C)] 97.4 F (36.3 C) (09/26 0412) Pulse Rate:  [65-76] 76 (09/26 0412) Resp:  [17-18] 18 (09/26 0412) BP: (150-161)/(84-100) 150/100 (09/26 0412) SpO2:  [98 %-100 %] 98 % (09/26 0412) Physical Exam: General: NAD. Lying in bed and eating breakfast. Cardiovascular: heart rate and rhythm regular.  Respiratory: sonorous upper airway sounds. Otherwise lungs clear to auscultation Abdomen: soft, nontender, nondistended Extremities: Moving all limbs normally according to baseline. No edema.  Laboratory: Most recent CBC Lab Results  Component Value Date   WBC 6.3 06/26/2024   HGB 8.9 (L) 06/26/2024   HCT 28.4 (L) 06/26/2024   MCV 83.8 06/26/2024   PLT 487 (H) 06/26/2024   Most recent BMP    Latest Ref Rng & Units 06/26/2024    4:08 AM  BMP  Glucose 70 - 99 mg/dL 80   BUN 8 - 23 mg/dL 33   Creatinine 9.38 - 1.24 mg/dL 7.88   Sodium 864 - 854 mmol/L 136   Potassium 3.5 - 5.1 mmol/L 4.8   Chloride 98 - 111 mmol/L 105   CO2 22 - 32 mmol/L 18   Calcium  8.9 - 10.3 mg/dL 9.2     Imaging/Diagnostic Tests: No new imaging acquired.   Skyeler Scalese, Marsa DASEN, Medical Student 06/26/2024, 7:12 AM  MS4-AI, [facility] FPTS Intern pager: [phone], text pages welcome Secure chat group [facility] Teaching Service

## 2024-06-26 NOTE — Plan of Care (Signed)
   Problem: Education: Goal: Knowledge of General Education information will improve Description: Including pain rating scale, medication(s)/side effects and non-pharmacologic comfort measures Outcome: Progressing   Problem: Clinical Measurements: Goal: Respiratory complications will improve Outcome: Progressing   Problem: Nutrition: Goal: Adequate nutrition will be maintained Outcome: Progressing

## 2024-06-26 NOTE — Assessment & Plan Note (Signed)
 History of stage IV pressure wound ulcer.  Previously treated with antibiotics and wound care at [facility].  Wound looks improved from previous pictures, will continue wound care as instructed previously. - WOC consulted

## 2024-06-26 NOTE — Assessment & Plan Note (Signed)
 Nephrology initiated ESA on 9/20.  Asymptomatic this morning. Stable. - Monitor for signs of bleeding - Transfuse if hemoglobin < 8 (hx of CAD) - Aranesp  40 mcg started on 9/20 and will recur weekly

## 2024-06-26 NOTE — Assessment & Plan Note (Addendum)
 Awaiting re-authorization to return to [facility].  Appeal authorization was approved today.

## 2024-06-26 NOTE — Assessment & Plan Note (Signed)
 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  Unstable angina / CAD / PAD: Restarted home ASA in the setting of stable Hgb HTN: Amlodipine  5 mg daily. Held other meds 2/2 AKI. HLD: Rosuvastatin  20 mg daily T2DM: last A1c 6.4, not on medications.  Monitor CBGs, appear stable while on prednisone .

## 2024-06-26 NOTE — TOC Transition Note (Signed)
 Transition of Care [facility]) - Discharge Note   Patient Details  Name: Theodore Arellano MRN: [id number] Date of Birth: December 11, 1960  Transition of Care [facility]) CM/SW Contact:  Lendia Dais, LCSWA Phone Number: 06/26/2024, 4:41 PM   Clinical Narrative:  Pt is discharging to [facility]. RN report to [phone]. PTAR was called at 1640.  CSW attempted to call son [name] no answer was receive with no option leave a VM.  No further TOC needs.     Final next level of care: Skilled Nursing Facility Barriers to Discharge: Barriers Resolved   Patient Goals and CMS Choice            Discharge Placement              Patient chooses bed at: Other - please specify in the comment section below: [facility]) Patient to be transferred to facility by: [facility] Name of family member notified: Jabbar Palmero Patient and family notified of of transfer: 06/26/24  Discharge Plan and Services Additional resources added to the After Visit Summary for                                       Social Drivers of Health (SDOH) Interventions SDOH Screenings   Food Insecurity: No Food Insecurity (06/19/2024)  Housing: Low Risk  (06/19/2024)  Transportation Needs: No Transportation Needs (06/19/2024)  Recent Concern: Transportation Needs - Unmet Transportation Needs (05/02/2024)  Utilities: Not At Risk (06/19/2024)  Tobacco Use: High Risk (06/19/2024)     Readmission Risk Interventions     No data to display

## 2024-06-26 NOTE — Progress Notes (Signed)
 Physical Therapy Treatment Patient Details Name: Theodore Arellano MRN: [id number] DOB: 12/17/1960 Today's Date: 06/26/2024   History of Present Illness Pt is a 63 y.o. M who presents from SNF with abnormal labs. Hemoglobin here was 6.1 and also found to have AKI with creatinine of 6.9. PMH includes HLD, HTN, CKD III, PAD, systolic HF, blindness in R eye, CABG, gout, DVT, cocaine  abuse, critical lower limb ischemia.    PT Comments  Pt seen for PT tx with pt pleasant, agreeable. Pt is able to complete R sidelying>sitting EOB without assistance, transfers bed>recliner without AD with CGA. PT strongly encouraged pt to attempt gait but pt declines, reporting something will give out on me... my hip. Will continue to follow pt acutely & progress mobility as able.    If plan is discharge home, recommend the following: A little help with walking and/or transfers;A little help with bathing/dressing/bathroom;Assistance with cooking/housework;Assist for transportation;Help with stairs or ramp for entrance;Supervision due to cognitive status   Can travel by private vehicle     Yes  Equipment Recommendations   (defer to SNF)    Recommendations for Other Services       Precautions / Restrictions Precautions Precautions: Fall;Other (comment) Recall of Precautions/Restrictions: Intact Precaution/Restrictions Comments: Sacral and L heel unstageable wounds Restrictions Weight Bearing Restrictions Per Provider Order: No     Mobility  Bed Mobility Overal bed mobility: Needs Assistance Bed Mobility: Supine to Sit     Supine to sit: HOB elevated, Modified independent (Device/Increase time), Used rails (exit R side of bed)          Transfers Overall transfer level: Needs assistance Equipment used: None Transfers: Bed to chair/wheelchair/BSC     Step pivot transfers: Contact guard assist (stand pivot bed>recliner on R without AD)            Ambulation/Gait                    Stairs             Wheelchair Mobility     Tilt Bed    Modified Rankin (Stroke Patients Only)       Balance Overall balance assessment: Needs assistance Sitting-balance support: Feet supported Sitting balance-Leahy Scale: Good     Standing balance support: During functional activity, No upper extremity supported Standing balance-Leahy Scale: Fair                              Hotel manager: No apparent difficulties Factors Affecting Communication: Reduced clarity of speech  Cognition Arousal: Alert Behavior During Therapy: WFL for tasks assessed/performed   PT - Cognitive impairments: No apparent impairments                         Following commands: Intact      Cueing Cueing Techniques: Verbal cues  Exercises      General Comments        Pertinent Vitals/Pain Pain Assessment Pain Assessment: No/denies pain    Home Living                          Prior Function            PT Goals (current goals can now be found in the care plan section) Acute Rehab PT Goals Patient Stated Goal: decrease pain PT Goal Formulation: With patient Time For Goal Achievement:  07/04/24 Potential to Achieve Goals: Good Progress towards PT goals: Progressing toward goals    Frequency    Min 1X/week      PT Plan      Co-evaluation              AM-PAC PT 6 Clicks Mobility   Outcome Measure  Help needed turning from your back to your side while in a flat bed without using bedrails?: None Help needed moving from lying on your back to sitting on the side of a flat bed without using bedrails?: A Little Help needed moving to and from a bed to a chair (including a wheelchair)?: A Little Help needed standing up from a chair using your arms (e.g., wheelchair or bedside chair)?: A Little Help needed to walk in hospital room?: Total Help needed climbing 3-5 steps with a railing? : Total 6 Click  Score: 15    End of Session   Activity Tolerance: Patient tolerated treatment well Patient left: in chair;with chair alarm set;with call bell/phone within reach;with nursing/sitter in room Nurse Communication: Mobility status PT Visit Diagnosis: Muscle weakness (generalized) (M62.81);Difficulty in walking, not elsewhere classified (R26.2)     Time: 8878-8865 PT Time Calculation (min) (ACUTE ONLY): 13 min  Charges:    $Therapeutic Activity: 8-22 mins PT General Charges $$ ACUTE PT VISIT: 1 Visit                     Richerd Pinal, PT, DPT 06/26/24, 11:40 AM   [name] 06/26/2024, 11:39 AM

## 2024-06-26 NOTE — Assessment & Plan Note (Addendum)
 Pt passed a large BM yesterday.  - Continued scheduled miralax  BID, Senna BID

## 2024-06-29 ENCOUNTER — Emergency Department (HOSPITAL_COMMUNITY)
Admission: EM | Admit: 2024-06-29 | Discharge: 2024-06-30 | Disposition: A | Discharge status: Home / Self Care | Attending: Emergency Medicine | Admitting: Emergency Medicine

## 2024-06-29 DIAGNOSIS — Z452 Encounter for adjustment and management of vascular access device: Secondary | ICD-10-CM | POA: Diagnosis present

## 2024-06-29 DIAGNOSIS — F17200 Nicotine dependence, unspecified, uncomplicated: Secondary | ICD-10-CM | POA: Insufficient documentation

## 2024-06-29 DIAGNOSIS — D631 Anemia in chronic kidney disease: Secondary | ICD-10-CM | POA: Insufficient documentation

## 2024-06-29 DIAGNOSIS — Z79899 Other long term (current) drug therapy: Secondary | ICD-10-CM | POA: Insufficient documentation

## 2024-06-29 DIAGNOSIS — I2510 Atherosclerotic heart disease of native coronary artery without angina pectoris: Secondary | ICD-10-CM | POA: Diagnosis not present

## 2024-06-29 DIAGNOSIS — Z789 Other specified health status: Secondary | ICD-10-CM

## 2024-06-29 DIAGNOSIS — Z7901 Long term (current) use of anticoagulants: Secondary | ICD-10-CM | POA: Diagnosis not present

## 2024-06-29 DIAGNOSIS — I129 Hypertensive chronic kidney disease with stage 1 through stage 4 chronic kidney disease, or unspecified chronic kidney disease: Secondary | ICD-10-CM | POA: Diagnosis not present

## 2024-06-29 DIAGNOSIS — Z7982 Long term (current) use of aspirin: Secondary | ICD-10-CM | POA: Diagnosis not present

## 2024-06-29 DIAGNOSIS — N189 Chronic kidney disease, unspecified: Secondary | ICD-10-CM | POA: Insufficient documentation

## 2024-06-29 LAB — COMPREHENSIVE METABOLIC PANEL WITH GFR
ALT: 8 U/L (ref 0–44)
AST: 12 U/L — ABNORMAL LOW (ref 15–41)
Albumin: 2.7 g/dL — ABNORMAL LOW (ref 3.5–5.0)
Alkaline Phosphatase: 79 U/L (ref 38–126)
Anion gap: 12 (ref 5–15)
BUN: 26 mg/dL — ABNORMAL HIGH (ref 8–23)
CO2: 22 mmol/L (ref 22–32)
Calcium: 8.7 mg/dL — ABNORMAL LOW (ref 8.9–10.3)
Chloride: 107 mmol/L (ref 98–111)
Creatinine, Ser: 1.65 mg/dL — ABNORMAL HIGH (ref 0.61–1.24)
GFR, Estimated: 46 mL/min — ABNORMAL LOW (ref >60)
Glucose, Bld: 97 mg/dL (ref 70–99)
Potassium: 4.4 mmol/L (ref 3.5–5.1)
Sodium: 141 mmol/L (ref 135–145)
Total Bilirubin: 0.3 mg/dL (ref 0.0–1.2)
Total Protein: 7.2 g/dL (ref 6.5–8.1)

## 2024-06-29 LAB — CBC
HCT: 30.3 % — ABNORMAL LOW (ref 39.0–52.0)
Hemoglobin: 9.1 g/dL — ABNORMAL LOW (ref 13.0–17.0)
MCH: 26.2 pg (ref 26.0–34.0)
MCHC: 30 g/dL (ref 30.0–36.0)
MCV: 87.3 fL (ref 80.0–100.0)
Platelets: 484 K/uL — ABNORMAL HIGH (ref 150–400)
RBC: 3.47 MIL/uL — ABNORMAL LOW (ref 4.22–5.81)
RDW: 17.6 % — ABNORMAL HIGH (ref 11.5–15.5)
WBC: 7.6 K/uL (ref 4.0–10.5)
nRBC: 0 % (ref 0.0–0.2)

## 2024-06-29 MED ORDER — SODIUM CHLORIDE 0.9 % IV SOLN
3.0000 g | Freq: Once | INTRAVENOUS | Status: AC
  Administered 2024-06-29: 3 g via INTRAVENOUS
  Filled 2024-06-29: qty 8

## 2024-06-29 NOTE — ED Triage Notes (Signed)
  Pt BIB EMS from [facility] due to PICC   line not flushing. Pt is being treated fro sacral wound. Pt is getting IV antibiotics was not able to receive 3rd dose today due to PICC line not flushing  EMS VS 148 palpated BP 88 HR 98% RA 16RR

## 2024-06-29 NOTE — ED Triage Notes (Signed)
 Pt PICC line had good blood return and flushed easily with no difficulty and no pain by ER RN.

## 2024-06-29 NOTE — ED Provider Notes (Addendum)
 [place] [facility] Provider Note   CSN: [id number] Arrival date & time: 06/29/24  2211     Patient presents with: Vascular Access Problem   Theodore Arellano is a 63 y.o. 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  Patient presents to ED for evaluation of a flushed midline.  Patient states that he was admitted to the hospital from 9/18 to 9/21 secondary to symptomatic anemia, large sacral wound, epididymitis/orchitis.  Patient was discharged with midline and recommended Unasyn  3 g for 14 days after discharge.  Patient was sent to the ED this evening because apparently his midline was flushed at his nursing facility.  Patient received 2 rounds of antibiotics today but was not able to receive third round.  He was sent to the ED for further care.  He denies any concerns.  He does arrive with a sacral wound which has padding which is soaked through.  Please see photos.  He denies any chest pain, shortness of breath, nausea vomiting, fevers.  Denies any pain to his testicles.  States that swelling has decreased.  Denies any blood in stool.  Denies abdominal pain.  Reports has not had wound care in 2 days.  Has sacral pad on sacral wound with date 9/24.  HPI     Prior to Admission medications   Medication Sig Start Date End Date Taking? Authorizing Provider  acetaminophen  (TYLENOL ) 500 MG tablet Take 500 mg by mouth every 12 (twelve) hours as needed for moderate pain (pain score 4-6) or headache.    [provider]  allopurinol  (ZYLOPRIM ) 100 MG tablet Take 100 mg by mouth daily.    [provider]  amLODipine  (NORVASC ) 5 MG tablet Take 1 tablet (5 mg total) by mouth daily. 06/21/24   Alba Sharper, MD   ampicillin -sulbactam (UNASYN ) IVPB Inject 3 g into the vein every 8 (eight) hours for 13 days. Indication:  Orchitis/epididymitis with Acinetobacter baumannii complex and Enterococcus faecalis First Dose: Yes Last Day of Therapy:  07/08/2024 Labs - Once weekly:  CBC/D and BMP, Labs - Once weekly: ESR and CRP 06/26/24 07/09/24  Janna Ferrier, DO  aspirin  EC 81 MG tablet Take 81 mg by mouth daily.    [provider]  collagenase  (SANTYL ) 250 UNIT/GM ointment Apply 1 Application topically daily.    [provider]  docusate sodium  (COLACE) 100 MG capsule Take 1 capsule (100 mg total) by mouth 2 (two) times daily as needed for mild constipation. 05/09/24   Danford, Lonni SQUIBB, MD  feeding supplement (ENSURE PLUS HIGH PROTEIN) LIQD Take 237 mLs by mouth 3 (three) times daily between meals. 05/09/24   Jonel Lonni SQUIBB, MD  ferrous sulfate  325 (65 FE) MG EC tablet Take 325 mg by mouth daily with breakfast.    [provider]  furosemide  (LASIX ) 40 MG tablet Take 40mg  on Monday, Wednesday and Friday. 06/21/24   Alba Sharper, MD  gabapentin  (NEURONTIN ) 300 MG capsule Take 300 mg by mouth 3 (three) times daily.    [provider]  lactulose (CHRONULAC) 10 GM/15ML solution Take 20 g by mouth 3 (three) times daily.    [provider]  lidocaine  (LIDODERM ) 5 % Place 1 patch onto the skin daily. Remove & Discard patch within 12 hours or as directed by MD  [provider]  losartan  (COZAAR ) 100 MG tablet Take 100 mg by mouth daily.    [provider]  Multiple Vitamin (MULTIVITAMIN WITH MINERALS) TABS tablet Take 1 tablet by mouth daily. 05/10/24   Danford, Lonni SQUIBB, MD  mycophenolate  (MYFORTIC ) 360 MG TBEC EC tablet Take 720 mg by mouth 2 (two) times daily.    [provider]  OXYGEN  Inhale 2 L into the lungs as needed.    [provider]  pantoprazole  (PROTONIX ) 40 MG tablet Take 40 mg by mouth daily before breakfast.  11/02/19   [provider]  predniSONE  (DELTASONE ) 5 MG tablet Take 5 mg by mouth daily.    [provider]  rosuvastatin  (CRESTOR ) 20 MG tablet Take 1 tablet (20 mg total) by mouth daily. 05/10/24   Danford, Lonni SQUIBB, MD  senna (SENOKOT) 8.6 MG tablet Take 17.2 mg by mouth 2 (two) times daily.    [provider]  tacrolimus  (PROGRAF ) 1 MG capsule Take 2-3 mg by mouth See admin instructions. Give 3 capsules (3mg ) by mouth in the morning and give 2 capsules (2mg ) in the afternoon.    [provider]  tamsulosin  (FLOMAX ) 0.4 MG CAPS capsule Take 2 capsules (0.8 mg total) by mouth daily. 06/21/24   Alba Sharper, MD  thiamine  (VITAMIN B1) 100 MG tablet Take 1 tablet (100 mg total) by mouth daily. 05/09/24   Danford, Lonni SQUIBB, MD    Allergies: Percocet [oxycodone -acetaminophen ], Vicodin [hydrocodone -acetaminophen ], Shellfish allergy, Chocolate flavoring agent (non-screening), Fish allergy, Chocolate, and Tomato    Review of Systems  All other systems reviewed and are negative.   Updated Vital Signs BP (!) 176/110   Pulse 82   Temp 97.7 F (36.5 C) (Oral)   SpO2 100%   Physical Exam Vitals and nursing note reviewed.  Constitutional:      General: He is not in acute distress.    Appearance: He is well-developed.  HENT:     Head: Normocephalic and atraumatic.  Eyes:     Conjunctiva/sclera: Conjunctivae normal.  Cardiovascular:     Rate and Rhythm: Normal rate and regular rhythm.     Heart sounds: No murmur heard. Pulmonary:     Effort: Pulmonary effort is normal. No respiratory distress.     Breath sounds: Normal breath sounds.  Abdominal:     Palpations: Abdomen is soft.     Tenderness: There is no abdominal tenderness.  Genitourinary:    Testes: Normal.     Comments: No testicular swelling, erythema, tenderness on exam.  RN chaperone present Musculoskeletal:        General: No swelling.     Cervical back: Neck supple.     Comments:  Midline catheter noted in left AC, flushes without difficulty  Skin:    General: Skin is warm and dry.     Capillary Refill: Capillary refill takes less than 2 seconds.         Comments: Stage IV pressure injury as noted in photo.  No erythema, purulence, surrounding induration or drainage.  Neurological:     General: No focal deficit present.     Mental Status: He is alert and oriented to person, place, and time. Mental status is at baseline.     GCS: GCS eye subscore is 4. GCS verbal subscore is 5. GCS motor subscore is 6.     Cranial Nerves: Cranial nerves 2-12 are intact. No cranial nerve deficit.     Sensory: Sensation is intact. No sensory deficit.  Motor: Motor function is intact. No weakness.     Coordination: Coordination is intact. Heel to [facility] Test normal.     Comments: CN III through XII intact.  Intact finger-to-nose, heel-to-shin.  No pronator drift.  No slurred speech.  Equal strength throughout.  Equal sensation throughout.  PERRL.  Tracks cross midline.  Alert and oriented x 4.  Psychiatric:        Mood and Affect: Mood normal.        (all labs ordered are listed, but only abnormal results are displayed) Labs Reviewed  CBC - Abnormal; Notable for the following components:      Result Value   RBC 3.47 (*)    Hemoglobin 9.1 (*)    HCT 30.3 (*)    RDW 17.6 (*)    Platelets 484 (*)    All other components within normal limits  COMPREHENSIVE METABOLIC PANEL WITH GFR - Abnormal; Notable for the following components:   BUN 26 (*)    Creatinine, Ser 1.65 (*)    Calcium  8.7 (*)    Albumin  2.7 (*)    AST 12 (*)    GFR, Estimated 46 (*)    All other components within normal limits    EKG: EKG Interpretation Date/Time:  Monday June 29 2024 22:45:20 EDT Ventricular Rate:  77 PR Interval:  246 QRS Duration:  107 QT Interval:  399 QTC Calculation: 452 R Axis:   23  Text Interpretation: Sinus or ectopic atrial rhythm Sinus pause Prolonged PR interval  Inferior infarct, old Consider anterior infarct Lateral leads are also involved nonspecific changes since previous Confirmed by Patt Alm DEL [phone]) on 06/29/2024 10:50:32 PM  Radiology: No results found.  Procedures   Medications Ordered in the ED  Ampicillin -Sulbactam (UNASYN ) 3 g in sodium chloride  0.9 % 100 mL IVPB (0 g Intravenous Stopped 06/30/24 0131)    Medical Decision Making Amount and/or Complexity of Data Reviewed Labs: ordered. ECG/medicine tests: ordered.   This is a 63 year old male presenting to the ED out of concern of a clogged midline in his left AC.  Patient apparently was recently admitted to the hospital and found to have epididymitis/orchitis and recommended Unasyn  3 g 3 times a day for 14 days after discharge.  Patient was found to have a clogged midline today when attempting to have his third dose given to him.  He was sent to the ED due to this.  On exam, the patient is hemodynamically stable.  He is afebrile, nontachycardic.  His lung sounds are clear bilaterally, there is no hypoxia.  Abdomen is soft and compressible.  Neurological examination at baseline.  Patient testicles nonswollen, nontender, no erythema.  Gastric reflex present.  Patient has a large stage IV sacral wound to his sacral area with no erythema, surrounding induration, fluctuance or purulent discharge.  Please see photo.  Patient midline was flushed without difficulty, no indication that midline was clogged.  He was given 3 g of Unasyn .  His labs were collected and show a stable hemoglobin of 9.1.  A stable creatinine 1.65 which is improved from previous admission.  Albumin  2.7, chronically low.  EKG without signs of ischemia.  Stage IV sacral ulcer was redressed with clean padding, cleansed by nursing staff.  Photo compared to the photo placed in his chart on day of discharge and there is no significant difference in these 2 photos.  No evidence of infection.  At this time, patient workup is  unremarkable. Patient midline flushes without difficulty.  Patient will be discharged home at this time. Return precautions given and the patient voiced understanding. All questions answered to patient satisfaction. Stable to discharge.    Final diagnoses:  Problem with vascular access    ED Discharge Orders     None           Jerral Meth, MD 06/30/24 0217    Ruthell Lonni FALCON, PA-C 06/30/24 [id number]    Jerral Meth, MD 06/30/24 [phone]

## 2024-06-29 NOTE — ED Provider Notes (Incomplete)
 [place] [facility] AT [facility] Provider Note   CSN: [id number] Arrival date & time: 06/29/24  2211     Patient presents with: Vascular Access Problem   Theodore Arellano is a 63 y.o. 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  Patient presents to ED for evaluation of a flushed midline.  Patient states that he was admitted to the hospital from 9/18 to 9/21 secondary to symptomatic anemia, large sacral wound, epididymitis/orchitis.  Patient was discharged with midline and recommended Unasyn  3 g for 14 days after discharge.  Patient was sent to the ED this evening because apparently his midline was flushed at his nursing facility.  Patient received 2 rounds of antibiotics today but was not able to receive third round.  He was sent to the ED for further care.  He denies any concerns.  He does arrive with a sacral wound which has padding which is soaked through.  Please see photos.  He denies any chest pain, shortness of breath, nausea vomiting, fevers.  Denies any pain to his testicles.  States that swelling has decreased.  Denies any blood in stool.  Denies abdominal pain.  Reports has not had wound care in 2 days.  Has sacral pad on sacral wound with date 9/24.  HPI     Prior to Admission medications   Medication Sig Start Date End Date Taking? Authorizing Provider  acetaminophen  (TYLENOL ) 500 MG tablet Take 500 mg by mouth every 12 (twelve) hours as needed for moderate pain (pain score 4-6) or headache.    [provider]  allopurinol  (ZYLOPRIM ) 100 MG tablet Take 100 mg by mouth daily.    [provider]  amLODipine  (NORVASC ) 5 MG tablet Take 1 tablet (5 mg total) by mouth daily. 06/21/24   Quillen, Michael, MD   ampicillin -sulbactam (UNASYN ) IVPB Inject 3 g into the vein every 8 (eight) hours for 13 days. Indication:  Orchitis/epididymitis with Acinetobacter baumannii complex and Enterococcus faecalis First Dose: Yes Last Day of Therapy:  07/08/2024 Labs - Once weekly:  CBC/D and BMP, Labs - Once weekly: ESR and CRP 06/26/24 07/09/24  Janna Ferrier, DO  aspirin  EC 81 MG tablet Take 81 mg by mouth daily.    [provider]  collagenase  (SANTYL ) 250 UNIT/GM ointment Apply 1 Application topically daily.    [provider]  docusate sodium  (COLACE) 100 MG capsule Take 1 capsule (100 mg total) by mouth 2 (two) times daily as needed for mild constipation. 05/09/24   Danford, Lonni SQUIBB, MD  feeding supplement (ENSURE PLUS HIGH PROTEIN) LIQD Take 237 mLs by mouth 3 (three) times daily between meals. 05/09/24   Jonel Lonni SQUIBB, MD  ferrous sulfate  325 (65 FE) MG EC tablet Take 325 mg by mouth daily with breakfast.    [provider]  furosemide  (LASIX ) 40 MG tablet Take 40mg  on Monday, Wednesday and Friday. 06/21/24   Alba Sharper, MD  gabapentin  (NEURONTIN ) 300 MG capsule Take 300 mg by mouth 3 (three) times daily.    [provider]  lactulose (CHRONULAC) 10 GM/15ML solution Take 20 g by mouth 3 (three) times daily.    [provider]  lidocaine  (LIDODERM ) 5 % Place 1 patch onto the skin daily. Remove & Discard patch within 12 hours or as directed by MD  [provider]  losartan  (COZAAR ) 100 MG tablet Take 100 mg by mouth daily.    [provider]  Multiple Vitamin (MULTIVITAMIN WITH MINERALS) TABS tablet Take 1 tablet by mouth daily. 05/10/24   Danford, Lonni SQUIBB, MD  mycophenolate  (MYFORTIC ) 360 MG TBEC EC tablet Take 720 mg by mouth 2 (two) times daily.    [provider]  OXYGEN  Inhale 2 L into the lungs as needed.    [provider]  pantoprazole  (PROTONIX ) 40 MG tablet Take 40 mg by mouth daily before breakfast.  11/02/19   [provider]  predniSONE  (DELTASONE ) 5 MG tablet Take 5 mg by mouth daily.    [provider]  rosuvastatin  (CRESTOR ) 20 MG tablet Take 1 tablet (20 mg total) by mouth daily. 05/10/24   Danford, Lonni SQUIBB, MD  senna (SENOKOT) 8.6 MG tablet Take 17.2 mg by mouth 2 (two) times daily.    [provider]  tacrolimus  (PROGRAF ) 1 MG capsule Take 2-3 mg by mouth See admin instructions. Give 3 capsules (3mg ) by mouth in the morning and give 2 capsules (2mg ) in the afternoon.    [provider]  tamsulosin  (FLOMAX ) 0.4 MG CAPS capsule Take 2 capsules (0.8 mg total) by mouth daily. 06/21/24   Alba Sharper, MD  thiamine  (VITAMIN B1) 100 MG tablet Take 1 tablet (100 mg total) by mouth daily. 05/09/24   Danford, Lonni SQUIBB, MD    Allergies: Percocet [oxycodone -acetaminophen ], Vicodin [hydrocodone -acetaminophen ], Shellfish allergy, Chocolate flavoring agent (non-screening), Fish allergy, Chocolate, and Tomato    Review of Systems  Updated Vital Signs BP (!) 176/110   Pulse 82   Temp 97.7 F (36.5 C) (Oral)   SpO2 100%   Physical Exam     (all labs ordered are listed, but only abnormal results are displayed) Labs Reviewed  CBC  COMPREHENSIVE METABOLIC PANEL WITH GFR    EKG: EKG Interpretation Date/Time:  Monday June 29 2024 22:45:20 EDT Ventricular Rate:  77 PR Interval:  246 QRS Duration:  107 QT Interval:  399 QTC Calculation: 452 R Axis:   23  Text Interpretation: Sinus or ectopic atrial rhythm Sinus pause Prolonged PR interval Inferior infarct, old Consider anterior infarct Lateral leads are also involved nonspecific changes since previous Confirmed by Patt Alm DEL [phone]) on 06/29/2024 10:50:32 PM  Radiology: No results found.  {Document cardiac monitor, telemetry assessment procedure when appropriate:32947} Procedures   Medications Ordered in the ED  Ampicillin -Sulbactam (UNASYN ) 3 g in sodium chloride  0.9 % 100 mL  IVPB (has no administration in time range)      {Click here for ABCD2, HEART and other calculators REFRESH Note before signing:1}                              Medical Decision Making Amount and/or Complexity of Data Reviewed Labs: ordered. ECG/medicine tests: ordered.   ***  {Document critical care time when appropriate  Document review of labs and clinical decision tools ie CHADS2VASC2, etc  Document your independent review of radiology images and any outside records  Document your discussion with family members, caretakers and with consultants  Document social determinants of health affecting pt's care  Document your decision making why or why not admission, treatments were needed:32947:::1}   Final diagnoses:  None    ED Discharge Orders     None

## 2024-06-30 DIAGNOSIS — Z452 Encounter for adjustment and management of vascular access device: Secondary | ICD-10-CM | POA: Diagnosis not present

## 2024-06-30 NOTE — Discharge Instructions (Addendum)
 It was a pleasure taking part in your care.  As discussed, your midline was flushed without difficulty here and you received 3 g of Unasyn .  Please continue to give the patient 3 grams of Unasyn  3 times a day.  Please continue to change the patient's sacral wound pad once a day.  The patient may follow-up with his PCP for further care.  The patient may return to the ED with new symptoms.

## 2024-06-30 NOTE — ED Notes (Signed)
 Ptar called

## 2024-07-28 LAB — URINE CULTURE: Culture: >=100000 — AB

## 2024-07-29 ENCOUNTER — Ambulatory Visit: Payer: Self-pay | Admitting: Family Medicine
